# Patient Record
Sex: Male | Born: 1940 | Race: White | Hispanic: No | Smoking: Former smoker
Health system: Southern US, Community
[De-identification: ages and names within clinical notes are randomized; demographics above are authoritative.]

## PROBLEM LIST (undated history)

## (undated) DIAGNOSIS — I214 Non-ST elevation (NSTEMI) myocardial infarction: Secondary | ICD-10-CM

## (undated) DIAGNOSIS — I480 Paroxysmal atrial fibrillation: Secondary | ICD-10-CM

## (undated) DIAGNOSIS — E119 Type 2 diabetes mellitus without complications: Secondary | ICD-10-CM

## (undated) DIAGNOSIS — I2510 Atherosclerotic heart disease of native coronary artery without angina pectoris: Secondary | ICD-10-CM

## (undated) DIAGNOSIS — E785 Hyperlipidemia, unspecified: Secondary | ICD-10-CM

## (undated) DIAGNOSIS — I10 Essential (primary) hypertension: Secondary | ICD-10-CM

## (undated) DIAGNOSIS — Z9289 Personal history of other medical treatment: Secondary | ICD-10-CM

## (undated) DIAGNOSIS — R011 Cardiac murmur, unspecified: Secondary | ICD-10-CM

## (undated) DIAGNOSIS — M1990 Unspecified osteoarthritis, unspecified site: Secondary | ICD-10-CM

## (undated) DIAGNOSIS — I350 Nonrheumatic aortic (valve) stenosis: Secondary | ICD-10-CM

## (undated) DIAGNOSIS — K219 Gastro-esophageal reflux disease without esophagitis: Secondary | ICD-10-CM

## (undated) DIAGNOSIS — R319 Hematuria, unspecified: Secondary | ICD-10-CM

## (undated) DIAGNOSIS — C4491 Basal cell carcinoma of skin, unspecified: Secondary | ICD-10-CM

## (undated) DIAGNOSIS — Z87442 Personal history of urinary calculi: Secondary | ICD-10-CM

## (undated) HISTORY — PX: BASAL CELL CARCINOMA EXCISION: SHX1214

## (undated) HISTORY — DX: Hematuria, unspecified: R31.9

## (undated) HISTORY — PX: CATARACT EXTRACTION W/ INTRAOCULAR LENS  IMPLANT, BILATERAL: SHX1307

## (undated) HISTORY — DX: Hyperlipidemia, unspecified: E78.5

## (undated) HISTORY — PX: EYE SURGERY: SHX253

## (undated) HISTORY — DX: Essential (primary) hypertension: I10

## (undated) HISTORY — PX: CARDIAC CATHETERIZATION: SHX172

## (undated) HISTORY — PX: CYSTOSCOPY WITH STENT PLACEMENT: SHX5790

---
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
## 2011-03-16 DIAGNOSIS — R42 Dizziness and giddiness: Secondary | ICD-10-CM | POA: Diagnosis not present

## 2011-03-16 DIAGNOSIS — E7800 Pure hypercholesterolemia, unspecified: Secondary | ICD-10-CM | POA: Diagnosis not present

## 2011-03-16 DIAGNOSIS — G44209 Tension-type headache, unspecified, not intractable: Secondary | ICD-10-CM | POA: Diagnosis not present

## 2011-03-16 DIAGNOSIS — I10 Essential (primary) hypertension: Secondary | ICD-10-CM | POA: Diagnosis not present

## 2011-03-16 DIAGNOSIS — E119 Type 2 diabetes mellitus without complications: Secondary | ICD-10-CM | POA: Diagnosis not present

## 2011-03-17 DIAGNOSIS — L819 Disorder of pigmentation, unspecified: Secondary | ICD-10-CM | POA: Diagnosis not present

## 2011-03-17 DIAGNOSIS — L570 Actinic keratosis: Secondary | ICD-10-CM | POA: Diagnosis not present

## 2011-03-17 DIAGNOSIS — Z85828 Personal history of other malignant neoplasm of skin: Secondary | ICD-10-CM | POA: Diagnosis not present

## 2011-03-17 DIAGNOSIS — L219 Seborrheic dermatitis, unspecified: Secondary | ICD-10-CM | POA: Diagnosis not present

## 2011-03-22 DIAGNOSIS — I10 Essential (primary) hypertension: Secondary | ICD-10-CM | POA: Diagnosis not present

## 2011-03-22 DIAGNOSIS — R51 Headache: Secondary | ICD-10-CM | POA: Diagnosis not present

## 2011-03-22 DIAGNOSIS — R42 Dizziness and giddiness: Secondary | ICD-10-CM | POA: Diagnosis not present

## 2011-03-24 DIAGNOSIS — R51 Headache: Secondary | ICD-10-CM | POA: Diagnosis not present

## 2011-03-24 DIAGNOSIS — R42 Dizziness and giddiness: Secondary | ICD-10-CM | POA: Diagnosis not present

## 2011-03-29 DIAGNOSIS — I10 Essential (primary) hypertension: Secondary | ICD-10-CM | POA: Diagnosis not present

## 2011-03-29 DIAGNOSIS — R51 Headache: Secondary | ICD-10-CM | POA: Diagnosis not present

## 2011-04-13 DIAGNOSIS — IMO0002 Reserved for concepts with insufficient information to code with codable children: Secondary | ICD-10-CM | POA: Diagnosis not present

## 2011-04-13 DIAGNOSIS — R51 Headache: Secondary | ICD-10-CM | POA: Diagnosis not present

## 2011-04-13 DIAGNOSIS — I10 Essential (primary) hypertension: Secondary | ICD-10-CM | POA: Diagnosis not present

## 2011-08-10 DIAGNOSIS — Z125 Encounter for screening for malignant neoplasm of prostate: Secondary | ICD-10-CM | POA: Diagnosis not present

## 2011-08-10 DIAGNOSIS — Z79899 Other long term (current) drug therapy: Secondary | ICD-10-CM | POA: Diagnosis not present

## 2011-08-10 DIAGNOSIS — E7800 Pure hypercholesterolemia, unspecified: Secondary | ICD-10-CM | POA: Diagnosis not present

## 2011-08-13 DIAGNOSIS — Z79899 Other long term (current) drug therapy: Secondary | ICD-10-CM | POA: Diagnosis not present

## 2011-08-13 DIAGNOSIS — I10 Essential (primary) hypertension: Secondary | ICD-10-CM | POA: Diagnosis not present

## 2011-08-13 DIAGNOSIS — E7800 Pure hypercholesterolemia, unspecified: Secondary | ICD-10-CM | POA: Diagnosis not present

## 2011-08-23 DIAGNOSIS — H00029 Hordeolum internum unspecified eye, unspecified eyelid: Secondary | ICD-10-CM | POA: Diagnosis not present

## 2011-08-23 DIAGNOSIS — E1139 Type 2 diabetes mellitus with other diabetic ophthalmic complication: Secondary | ICD-10-CM | POA: Diagnosis not present

## 2011-09-06 DIAGNOSIS — Z86010 Personal history of colonic polyps: Secondary | ICD-10-CM | POA: Diagnosis not present

## 2011-09-10 DIAGNOSIS — I10 Essential (primary) hypertension: Secondary | ICD-10-CM | POA: Diagnosis not present

## 2011-09-10 DIAGNOSIS — Z880 Allergy status to penicillin: Secondary | ICD-10-CM | POA: Diagnosis not present

## 2011-09-10 DIAGNOSIS — Z86010 Personal history of colonic polyps: Secondary | ICD-10-CM | POA: Diagnosis not present

## 2011-09-10 DIAGNOSIS — E119 Type 2 diabetes mellitus without complications: Secondary | ICD-10-CM | POA: Diagnosis not present

## 2011-09-20 DIAGNOSIS — H00029 Hordeolum internum unspecified eye, unspecified eyelid: Secondary | ICD-10-CM | POA: Diagnosis not present

## 2011-12-14 DIAGNOSIS — Z79899 Other long term (current) drug therapy: Secondary | ICD-10-CM | POA: Diagnosis not present

## 2011-12-14 DIAGNOSIS — E7800 Pure hypercholesterolemia, unspecified: Secondary | ICD-10-CM | POA: Diagnosis not present

## 2011-12-20 DIAGNOSIS — Z85828 Personal history of other malignant neoplasm of skin: Secondary | ICD-10-CM | POA: Diagnosis not present

## 2011-12-20 DIAGNOSIS — L819 Disorder of pigmentation, unspecified: Secondary | ICD-10-CM | POA: Diagnosis not present

## 2011-12-20 DIAGNOSIS — L570 Actinic keratosis: Secondary | ICD-10-CM | POA: Diagnosis not present

## 2011-12-20 DIAGNOSIS — L821 Other seborrheic keratosis: Secondary | ICD-10-CM | POA: Diagnosis not present

## 2011-12-21 DIAGNOSIS — Z23 Encounter for immunization: Secondary | ICD-10-CM | POA: Diagnosis not present

## 2011-12-21 DIAGNOSIS — Z79899 Other long term (current) drug therapy: Secondary | ICD-10-CM | POA: Diagnosis not present

## 2011-12-21 DIAGNOSIS — E7800 Pure hypercholesterolemia, unspecified: Secondary | ICD-10-CM | POA: Diagnosis not present

## 2011-12-21 DIAGNOSIS — I10 Essential (primary) hypertension: Secondary | ICD-10-CM | POA: Diagnosis not present

## 2012-05-05 ENCOUNTER — Encounter (HOSPITAL_COMMUNITY)
Admission: AD | Disposition: A | Discharge status: Home / Self Care | Payer: Self-pay | Source: Other Acute Inpatient Hospital | Attending: Cardiology

## 2012-05-05 ENCOUNTER — Inpatient Hospital Stay (HOSPITAL_COMMUNITY)
Admission: AD | Admit: 2012-05-05 | Discharge: 2012-05-06 | DRG: 247 | Disposition: A | Discharge status: Home / Self Care | Payer: Medicare Other | Source: Other Acute Inpatient Hospital | Attending: Cardiology | Admitting: Cardiology

## 2012-05-05 ENCOUNTER — Other Ambulatory Visit: Payer: Self-pay | Admitting: Physician Assistant

## 2012-05-05 DIAGNOSIS — Z0000 Encounter for general adult medical examination without abnormal findings: Secondary | ICD-10-CM | POA: Diagnosis not present

## 2012-05-05 DIAGNOSIS — I214 Non-ST elevation (NSTEMI) myocardial infarction: Secondary | ICD-10-CM | POA: Diagnosis not present

## 2012-05-05 DIAGNOSIS — I10 Essential (primary) hypertension: Secondary | ICD-10-CM | POA: Diagnosis not present

## 2012-05-05 DIAGNOSIS — Z8249 Family history of ischemic heart disease and other diseases of the circulatory system: Secondary | ICD-10-CM | POA: Diagnosis not present

## 2012-05-05 DIAGNOSIS — Z23 Encounter for immunization: Secondary | ICD-10-CM | POA: Diagnosis not present

## 2012-05-05 DIAGNOSIS — Z91041 Radiographic dye allergy status: Secondary | ICD-10-CM

## 2012-05-05 DIAGNOSIS — Z880 Allergy status to penicillin: Secondary | ICD-10-CM | POA: Diagnosis not present

## 2012-05-05 DIAGNOSIS — E7800 Pure hypercholesterolemia, unspecified: Secondary | ICD-10-CM | POA: Diagnosis not present

## 2012-05-05 DIAGNOSIS — Z87891 Personal history of nicotine dependence: Secondary | ICD-10-CM | POA: Diagnosis not present

## 2012-05-05 DIAGNOSIS — Z7902 Long term (current) use of antithrombotics/antiplatelets: Secondary | ICD-10-CM | POA: Diagnosis not present

## 2012-05-05 DIAGNOSIS — R0609 Other forms of dyspnea: Secondary | ICD-10-CM | POA: Diagnosis not present

## 2012-05-05 DIAGNOSIS — I200 Unstable angina: Secondary | ICD-10-CM

## 2012-05-05 DIAGNOSIS — E119 Type 2 diabetes mellitus without complications: Secondary | ICD-10-CM | POA: Diagnosis present

## 2012-05-05 DIAGNOSIS — E785 Hyperlipidemia, unspecified: Secondary | ICD-10-CM | POA: Diagnosis present

## 2012-05-05 DIAGNOSIS — R7989 Other specified abnormal findings of blood chemistry: Secondary | ICD-10-CM | POA: Diagnosis not present

## 2012-05-05 DIAGNOSIS — Z79899 Other long term (current) drug therapy: Secondary | ICD-10-CM

## 2012-05-05 DIAGNOSIS — I2510 Atherosclerotic heart disease of native coronary artery without angina pectoris: Secondary | ICD-10-CM | POA: Diagnosis present

## 2012-05-05 DIAGNOSIS — Z7982 Long term (current) use of aspirin: Secondary | ICD-10-CM | POA: Diagnosis not present

## 2012-05-05 DIAGNOSIS — R799 Abnormal finding of blood chemistry, unspecified: Secondary | ICD-10-CM | POA: Diagnosis not present

## 2012-05-05 DIAGNOSIS — R0602 Shortness of breath: Secondary | ICD-10-CM | POA: Diagnosis not present

## 2012-05-05 DIAGNOSIS — R0789 Other chest pain: Secondary | ICD-10-CM | POA: Diagnosis not present

## 2012-05-05 DIAGNOSIS — R079 Chest pain, unspecified: Secondary | ICD-10-CM | POA: Diagnosis not present

## 2012-05-05 DIAGNOSIS — Z955 Presence of coronary angioplasty implant and graft: Secondary | ICD-10-CM

## 2012-05-05 HISTORY — DX: Atherosclerotic heart disease of native coronary artery without angina pectoris: I25.10

## 2012-05-05 HISTORY — PX: LEFT HEART CATHETERIZATION WITH CORONARY ANGIOGRAM: SHX5451

## 2012-05-05 LAB — HEMOGLOBIN A1C: Hgb A1c MFr Bld: 7.9 % — ABNORMAL HIGH (ref <5.7)

## 2012-05-05 LAB — POCT ACTIVATED CLOTTING TIME
Activated Clotting Time: 192 seconds
Activated Clotting Time: >1000 seconds

## 2012-05-05 LAB — GLUCOSE, CAPILLARY: Glucose-Capillary: 281 mg/dL — ABNORMAL HIGH (ref 70–99)

## 2012-05-05 SURGERY — LEFT HEART CATHETERIZATION WITH CORONARY ANGIOGRAM
Anesthesia: LOCAL

## 2012-05-05 MED ORDER — SODIUM CHLORIDE 0.9 % IJ SOLN: 3.0000 mL | INTRAMUSCULAR | Status: DC | PRN

## 2012-05-05 MED ORDER — ACETAMINOPHEN 325 MG PO TABS: 650.0000 mg | ORAL_TABLET | ORAL | Status: DC | PRN

## 2012-05-05 MED ORDER — ATORVASTATIN CALCIUM 80 MG PO TABS
80.0000 mg | ORAL_TABLET | Freq: Every day | ORAL | Status: DC
  Administered 2012-05-05: 80 mg via ORAL
  Filled 2012-05-05 (×2): qty 1

## 2012-05-05 MED ORDER — MORPHINE SULFATE 2 MG/ML IJ SOLN: 2.0000 mg | INTRAMUSCULAR | Status: DC | PRN

## 2012-05-05 MED ORDER — METOPROLOL TARTRATE 1 MG/ML IV SOLN
INTRAVENOUS | Status: AC
  Filled 2012-05-05: qty 5

## 2012-05-05 MED ORDER — ASPIRIN EC 81 MG PO TBEC
81.0000 mg | DELAYED_RELEASE_TABLET | Freq: Every day | ORAL | Status: DC
  Administered 2012-05-05 – 2012-05-06 (×2): 81 mg via ORAL
  Filled 2012-05-05 (×2): qty 1

## 2012-05-05 MED ORDER — HEPARIN SODIUM (PORCINE) 1000 UNIT/ML IJ SOLN
INTRAMUSCULAR | Status: AC
  Filled 2012-05-05: qty 1

## 2012-05-05 MED ORDER — NITROGLYCERIN 1 MG/10 ML FOR IR/CATH LAB
INTRA_ARTERIAL | Status: AC
  Filled 2012-05-05: qty 10

## 2012-05-05 MED ORDER — ZOLPIDEM TARTRATE 5 MG PO TABS: 5.0000 mg | ORAL_TABLET | Freq: Every evening | ORAL | Status: DC | PRN

## 2012-05-05 MED ORDER — MIDAZOLAM HCL 2 MG/2ML IJ SOLN
INTRAMUSCULAR | Status: AC
  Filled 2012-05-05: qty 2

## 2012-05-05 MED ORDER — BIVALIRUDIN 250 MG IV SOLR
INTRAVENOUS | Status: AC
  Filled 2012-05-05: qty 250

## 2012-05-05 MED ORDER — NITROGLYCERIN 0.4 MG SL SUBL: 0.4000 mg | SUBLINGUAL_TABLET | SUBLINGUAL | Status: DC | PRN

## 2012-05-05 MED ORDER — INSULIN ASPART 100 UNIT/ML ~~LOC~~ SOLN
0.0000 [IU] | Freq: Every day | SUBCUTANEOUS | Status: DC
  Administered 2012-05-05: 2 [IU] via SUBCUTANEOUS

## 2012-05-05 MED ORDER — METOPROLOL SUCCINATE ER 100 MG PO TB24
100.0000 mg | ORAL_TABLET | Freq: Two times a day (BID) | ORAL | Status: DC
  Administered 2012-05-05 – 2012-05-06 (×3): 100 mg via ORAL
  Filled 2012-05-05 (×4): qty 1

## 2012-05-05 MED ORDER — LISINOPRIL 20 MG PO TABS
20.0000 mg | ORAL_TABLET | Freq: Every day | ORAL | Status: DC
  Administered 2012-05-05 – 2012-05-06 (×2): 20 mg via ORAL
  Filled 2012-05-05 (×2): qty 1

## 2012-05-05 MED ORDER — INSULIN ASPART 100 UNIT/ML ~~LOC~~ SOLN
0.0000 [IU] | Freq: Three times a day (TID) | SUBCUTANEOUS | Status: DC
  Administered 2012-05-05 – 2012-05-06 (×2): 8 [IU] via SUBCUTANEOUS
  Administered 2012-05-06: 2 [IU] via SUBCUTANEOUS

## 2012-05-05 MED ORDER — PRASUGREL HCL 10 MG PO TABS
10.0000 mg | ORAL_TABLET | Freq: Every day | ORAL | Status: DC
  Administered 2012-05-06: 10 mg via ORAL
  Filled 2012-05-05: qty 1

## 2012-05-05 MED ORDER — NITROGLYCERIN IN D5W 200-5 MCG/ML-% IV SOLN
3.0000 ug/min | INTRAVENOUS | Status: DC
  Administered 2012-05-05: 5 ug/min via INTRAVENOUS
  Filled 2012-05-05: qty 250

## 2012-05-05 MED ORDER — SODIUM CHLORIDE 0.9 % IV SOLN: 250.0000 mL | INTRAVENOUS | Status: DC | PRN

## 2012-05-05 MED ORDER — OXYCODONE-ACETAMINOPHEN 5-325 MG PO TABS: 1.0000 | ORAL_TABLET | ORAL | Status: DC | PRN

## 2012-05-05 MED ORDER — LIDOCAINE HCL (PF) 1 % IJ SOLN
INTRAMUSCULAR | Status: AC
  Filled 2012-05-05: qty 30

## 2012-05-05 MED ORDER — ONDANSETRON HCL 4 MG/2ML IJ SOLN
4.0000 mg | Freq: Four times a day (QID) | INTRAMUSCULAR | Status: DC | PRN
  Administered 2012-05-05: 4 mg via INTRAVENOUS
  Filled 2012-05-05: qty 2

## 2012-05-05 MED ORDER — SODIUM CHLORIDE 0.9 % IV SOLN: INTRAVENOUS | Status: DC

## 2012-05-05 MED ORDER — DIAZEPAM 5 MG PO TABS: 5.0000 mg | ORAL_TABLET | ORAL | Status: DC | PRN

## 2012-05-05 MED ORDER — FENTANYL CITRATE 0.05 MG/ML IJ SOLN
INTRAMUSCULAR | Status: AC
  Filled 2012-05-05: qty 2

## 2012-05-05 MED ORDER — PRASUGREL HCL 10 MG PO TABS
ORAL_TABLET | ORAL | Status: AC
  Filled 2012-05-05: qty 6

## 2012-05-05 MED ORDER — VERAPAMIL HCL 2.5 MG/ML IV SOLN
INTRAVENOUS | Status: AC
  Filled 2012-05-05: qty 2

## 2012-05-05 MED ORDER — HEPARIN (PORCINE) IN NACL 2-0.9 UNIT/ML-% IJ SOLN
INTRAMUSCULAR | Status: AC
  Filled 2012-05-05: qty 1000

## 2012-05-05 MED ORDER — SODIUM CHLORIDE 0.9 % IJ SOLN
3.0000 mL | Freq: Two times a day (BID) | INTRAMUSCULAR | Status: DC
  Administered 2012-05-05: 3 mL via INTRAVENOUS

## 2012-05-05 MED ORDER — SODIUM CHLORIDE 0.9 % IV SOLN: INTRAVENOUS | Status: AC

## 2012-05-05 NOTE — Interval H&P Note (Signed)
History and Physical Interval Note:  05/05/2012 3:10 PM  Sean Villarreal  has presented today for cardiac cath with the diagnosis of Chest pain  The various methods of treatment have been discussed with the patient and family. After consideration of risks, benefits and other options for treatment, the patient has consented to  Procedure(s): LEFT HEART CATHETERIZATION WITH CORONARY ANGIOGRAM (N/A) as a surgical intervention .  The patient's history has been reviewed, patient examined, no change in status, stable for surgery.  I have reviewed the patient's chart and labs.  Questions were answered to the patient's satisfaction.     [name]

## 2012-05-05 NOTE — Progress Notes (Signed)
ANTICOAGULATION CONSULT NOTE - Initial Consult  Pharmacy Consult for Lovenox Indication: chest pain/ACS  Allergies  Allergen Reactions  . Penicillins     Passed out as a child  . Contrast Media (Iodinated Diagnostic Agents)     Had "welts" on arm, and kidney issues after given dye in the past    Patient Measurements: Height: 5\' 9"  (175.3 cm) Weight: 230 lb 6.1 oz (104.5 kg) IBW/kg (Calculated) : 70.7 Heparin Dosing Weight: 104.5 kg  Vital Signs: Temp: 98.1 F (36.7 C) (04/11 1300) Temp src: Oral (04/11 1300)  Labs: No results found for this basename: HGB, HCT, PLT, APTT, LABPROT, INR, HEPARINUNFRC, CREATININE, CKTOTAL, CKMB, TROPONINI,  in the last 72 hours  CrCl is unknown because no creatinine reading has been taken.   Medical History: No past medical history on file.  Medications:  Prescriptions prior to admission  Medication Sig Dispense Refill  . aspirin 81 MG tablet Take 81 mg by mouth daily.      [name] atorvastatin (LIPITOR) 10 MG tablet Take 10 mg by mouth daily.      [name] glimepiride (AMARYL) 1 MG tablet Take 1 mg by mouth daily before breakfast.      . hydrALAZINE (APRESOLINE) 50 MG tablet Take 50 mg by mouth 2 (two) times daily.      . hydrochlorothiazide (HYDRODIURIL) 50 MG tablet Take 50 mg by mouth daily.      [name] ketoconazole (NIZORAL) 2 % cream Apply 1 application topically 2 (two) times daily as needed (apply to face as needed (uses during the winter)).      [name] lisinopril (PRINIVIL,ZESTRIL) 20 MG tablet Take 20 mg by mouth 2 (two) times daily.      . metFORMIN (GLUCOPHAGE) 500 MG tablet Take 500 mg by mouth daily with breakfast.      . metoprolol (LOPRESSOR) 100 MG tablet Take 100 mg by mouth 2 (two) times daily.      . nisoldipine (SULAR) 34 MG 24 hr tablet Take 34 mg by mouth daily.      . potassium chloride SA (K-DUR,KLOR-CON) 20 MEQ tablet Take 20 mEq by mouth 2 (two) times daily.        Assessment: 72 yo male presented to [facility] ED with chest pain,  transferred to [facility] for urgent cath.  Lovenox 100 mg given x 1 dose in ED at [facility] ~ 430 AM.  Goal of Therapy:  Anti-Xa level 0.6-1.2 units/ml 4hrs after LMWH dose given Monitor platelets by anticoagulation protocol: Yes   Plan:  1. Will f/u after cath for plans for further anticoagulation.  Tad Moore, BCPS  Clinical Pharmacist Pager [phone]  05/05/2012 1:53 PM

## 2012-05-05 NOTE — Progress Notes (Signed)
Patient arrived from [facility].  Full consult note by Dr. Katz is found in shadow chart. No chest pain at present. Awaiting cath/PCI later this afternoon. 

## 2012-05-05 NOTE — CV Procedure (Signed)
Cardiac Catheterization Operative Report  Sean Villarreal [id number] 4/11/20145:03 PM No PCP Per Patient  Procedure Performed:  1. Left Heart Catheterization 2. Selective Coronary Angiography 3. Left ventricular angiogram 4. PTCA/DES x 1 mid LAD 5. PTCA/DES x 1 ostial Circumflex 6. Angioseal femoral artery closure device  Operator: Verne Carrow, MD  Arterial access site:  Right radial artery and right femoral artery.   Indication: 72 yo male with history of DM, HTN, HLD, former tobacco abuse admitted to [facility] today after several episodes of severe chest pain. Troponin elevated at [facility]. EKG with non-specific ST changes. Cardiac cath for NSTEMI.                                      Procedure Details: The risks, benefits, complications, treatment options, and expected outcomes were discussed with the patient. The patient and/or family concurred with the proposed plan, giving informed consent. The patient was brought to the cath lab after IV hydration was begun and oral premedication was given. The patient was further sedated with Versed. The right wrist was assessed with an Allens test which was positive. The right wrist was prepped and draped in a sterile fashion. 1% lidocaine was used for local anesthesia. Using the modified Seldinger access technique, a 5 French sheath was placed in the right radial artery. 3 mg Verapamil was given through the sheath. The wire easily passed to the right subclavian artery but we could not advance the wire into the aortic arch. I then elected to proceed with access in the right femoral artery. The right groin was prepped and draped in a sterile fashion. 1% lidocaine was used for local anesthesia. Using the modified Seldinger access technique, a 5 French sheath was placed in the right femoral artery. Standard diagnostic catheters were used to perform selective coronary angiography. A pigtail catheter was used to perform a left  ventricular angiogram. He was found to have severe disease in the mid LAD and ostium of the Circumflex artery. The lesion in the mid LAD was in a calcified segment of the vessel, appeared hazy with severe stenosis. The lesion in the ostium of the Circumflex was severe. His presentation was with a NSTEMI (acute coronary syndrome). It was difficult to say which lesion was the culprit. I weighed the options for treatment including CABG vs PCI. Both lesions were focal and I felt that they could be treated with short stents. My plan was to perform IVUS of the mid LAD to better define the degree of calcification in the vessel.   I engaged the left main with a XB LAD 3.5 guiding catheter. He was given a 5000 Unit IV bolus of heparin. When the ACT was over 200, I passed a BMW wire down the LAD. I then attempted to pass an IVUS catheter down beyond the mid LAD, however, I could not get the IVUS catheter to pass beyond the severe stenosis. The IVUS catheter was removed from the vessel. At this point, we committed to double vessel PCI as this was felt to be the best method for revascularization. He was given a bolus of Angiomax and a drip was started. He was given Effient 60 mg po x 1. I then positioned and inflated a 2.5 x 12 mm balloon into the mid LAD. A 3.0 x 18 mm Xience Xpedition DES was deployed in the mid LAD. The stent was post-dilated with  a 3.25 x 12 mm [place] balloon x 1. The stenosis was taken from 99% down to 0%. There was excellent flow into the distal LAD. I then turned my attention to the lesion in the ostium of the Circumflex. A Whisper wire was advanced down into the Circumflex artery. Flow was lost into the vessel with the wire across the lesion. A 2.0 x 12 mm balloon was used to pre-dilate the stenosis in the ostium of the Circumflex. I then carefully positioned and placed a 2.5 x 12 mm Xience Xpedition DES in the ostium of the Circumflex. This was post-dilated with a 2.75 x 8 mm [place] balloon x 1. The stenosis  was taken from 99% down to 0%. There was excellent flow into the distal vessel. Final angiography demonstrated patency of both stents with good flow into the distal Circumflex and distal LAD. An Angioseal femoral artery closure device was placed in the right femoral artery.   There were no immediate complications. The patient was taken to the recovery area in stable condition.   Hemodynamic Findings: Central aortic pressure: 120/65 Left ventricular pressure: 130/5/13  Angiographic Findings:  Left main: Distal 30% stenosis.   Left Anterior Descending Artery: Large caliber vessel that courses to the apex. The proximal and mid vessel has severe calcification. The proximal vessel has diffuse 30% stenosis. The mid vessel has a hazy, focal 99% stenosis. The mid vessel has serial 40% stenoses with diffuse calcification. The distal vessel has a 70% stenosis. The first diagonal branch is very small in caliber (1.5 mm) and has an ostial 80% stenosis. The second diagonal branch is small in caliber and has a 50% ostial stenosis.   Circumflex Artery: Moderate caliber vessel with 99% ostial stenosis. There is calcification of the proximal and mid vessel. The first obtuse marginal branch is moderate in caliber and has 40% ostial stenosis.   Right Coronary Artery:  Large dominant vessel with mild luminal irregularities.   Left Ventricular Angiogram: LVEF 50%  Impression:  1. Severe double vessel CAD 2. NSTEMI/Acute coronary syndrome 3. Preserved LV systolic function 4. Successful PTCA/DES x 1 mid LAD 5. Successful PTCA/DES x 1 ostial Circumflex  Recommendations: He will need dual antiplatelet therapy with ASA and Effient for at least one year. He will need aggressive risk factor reduction. Continue statin, beta blocker, Ace-inh.        Complications:  None. The patient tolerated the procedure well.

## 2012-05-05 NOTE — Progress Notes (Signed)
Utilization Review Completed.Erabella Kuipers T4/11/2012  

## 2012-05-05 NOTE — H&P (View-Only) (Signed)
Patient arrived from [facility].  Full consult note by Dr. Myrtis Ser is found in shadow chart. No chest pain at present. Awaiting cath/PCI later this afternoon.

## 2012-05-06 ENCOUNTER — Encounter (HOSPITAL_COMMUNITY): Payer: Self-pay | Admitting: *Deleted

## 2012-05-06 DIAGNOSIS — I2510 Atherosclerotic heart disease of native coronary artery without angina pectoris: Secondary | ICD-10-CM | POA: Diagnosis not present

## 2012-05-06 DIAGNOSIS — I10 Essential (primary) hypertension: Secondary | ICD-10-CM | POA: Diagnosis not present

## 2012-05-06 DIAGNOSIS — I214 Non-ST elevation (NSTEMI) myocardial infarction: Secondary | ICD-10-CM | POA: Diagnosis not present

## 2012-05-06 DIAGNOSIS — E119 Type 2 diabetes mellitus without complications: Secondary | ICD-10-CM | POA: Diagnosis not present

## 2012-05-06 DIAGNOSIS — I200 Unstable angina: Secondary | ICD-10-CM

## 2012-05-06 LAB — BASIC METABOLIC PANEL
BUN: 16 mg/dL (ref 6–23)
Creatinine, Ser: 0.94 mg/dL (ref 0.50–1.35)
GFR calc Af Amer: >90 mL/min (ref >90)
GFR calc non Af Amer: 83 mL/min — ABNORMAL LOW (ref >90)
Potassium: 3.8 mEq/L (ref 3.5–5.1)

## 2012-05-06 LAB — CBC
MCH: 30.8 pg (ref 26.0–34.0)
MCHC: 34.9 g/dL (ref 30.0–36.0)
Platelets: 199 10*3/uL (ref 150–400)
RDW: 12.6 % (ref 11.5–15.5)

## 2012-05-06 MED ORDER — NITROGLYCERIN 0.4 MG SL SUBL: 0.4000 mg | SUBLINGUAL_TABLET | SUBLINGUAL | Status: DC | PRN

## 2012-05-06 MED ORDER — METOPROLOL SUCCINATE ER 100 MG PO TB24: 100.0000 mg | ORAL_TABLET | Freq: Two times a day (BID) | ORAL | Status: DC

## 2012-05-06 MED ORDER — PRASUGREL HCL 10 MG PO TABS: 10.0000 mg | ORAL_TABLET | Freq: Every day | ORAL | Status: DC

## 2012-05-06 MED ORDER — ATORVASTATIN CALCIUM 80 MG PO TABS: 80.0000 mg | ORAL_TABLET | Freq: Every day | ORAL | Status: DC

## 2012-05-06 MED ORDER — METFORMIN HCL 500 MG PO TABS: 500.0000 mg | ORAL_TABLET | Freq: Every day | ORAL | Status: DC

## 2012-05-06 MED ORDER — LISINOPRIL 20 MG PO TABS: 20.0000 mg | ORAL_TABLET | Freq: Every day | ORAL | Status: DC

## 2012-05-06 NOTE — Plan of Care (Signed)
Problem: Phase II Progression Outcomes Goal: Pain controlled with appropriate interventions Outcome: Completed/Met Date Met:  05/06/12 Denied pain,pressure,tightness,SOB,N ,numbness and tingling this shift . Goal: Ambulates up to 600 ft. in hall x 1 SEE cardiac rehab nurse notes

## 2012-05-06 NOTE — Progress Notes (Signed)
CARDIAC REHAB PHASE I   PRE:  Rate/Rhythm:Sr 74  BP:  Supine:   Sitting: 127/61  Standing:    SaO2: 95 RA  MODE:  Ambulation: 350 ft   POST:  Rate/Rhythm: Sr 95  BP:  Supine:  Sitting: 137/79  Standing:    SaO2: 96 RA  Ambulated with minimal assist in hallway.  Pt with no complaint of CP or SOB.  Pt anticipates going home today.  Education completed with emphasis on diabetes management.  Reviewed heart healthy diet, activity guidelines/exercise, stent card, NTG protocol alert 911 for any unresolved CP.  Pt given Heart Attack booklet.  Pt agreeable for name and number to be sent to the Outpatient Cardiac Rehab program at [facility].  Wife at bedside.  Pt to side of bed. [id number] Arna Medici

## 2012-05-06 NOTE — Progress Notes (Signed)
Watching video w/wife .routine post d/c home activity ,rt goin site and RUE  care ,etc.... reviewed w/Pt and spouse .

## 2012-05-06 NOTE — Discharge Summary (Signed)
ELECTROPHYSIOLOGY DISCHARGE SUMMARY    Patient ID: Sean Villarreal,  MRN: [id number], DOB/AGE: 1940-08-31 72 y.o.  Admit date: 05/05/2012 Discharge date: 05/06/2012  Primary Care Physician: None Primary Cardiologist: New to [facility], MD  Primary Discharge Diagnosis:  1. CAD s/p NSTEMI, PTCA/DES x 1 to mid LAD, PTCA/DES x 1 to ostial LCx 2. Preserved LV function  Secondary Discharge Diagnoses:  1. HTN 2. Dyslipidemia 3. DM 4. Former tobacco abuse  Procedures This Admission: 1. Cardiac catheterization  Hemodynamic Findings:  Central aortic pressure: 120/65  Left ventricular pressure: 130/5/13  Angiographic Findings:  Left main: Distal 30% stenosis.  Left Anterior Descending Artery: Large caliber vessel that courses to the apex. The proximal and mid vessel has severe calcification. The proximal vessel has diffuse 30% stenosis. The mid vessel has a hazy, focal 99% stenosis. The mid vessel has serial 40% stenoses with diffuse calcification. The distal vessel has a 70% stenosis. The first diagonal branch is very small in caliber (1.5 mm) and has an ostial 80% stenosis. The second diagonal branch is small in caliber and has a 50% ostial stenosis.  Circumflex Artery: Moderate caliber vessel with 99% ostial stenosis. There is calcification of the proximal and mid vessel. The first obtuse marginal branch is moderate in caliber and has 40% ostial stenosis.  Right Coronary Artery: Large dominant vessel with mild luminal irregularities.  Left Ventricular Angiogram: LVEF 50%  Impression:  1. Severe double vessel CAD  2. NSTEMI/Acute coronary syndrome  3. Preserved LV systolic function  4. Successful PTCA/DES x 1 mid LAD  5. Successful PTCA/DES x 1 ostial Circumflex  Recommendations: He will need dual antiplatelet therapy with ASA and Effient for at least one year. He will need aggressive risk factor reduction. Continue statin, beta blocker, Ace-inh.   History and Hospital Course:    Mr. Wanat is a 72 yo man with DM, HTN, HLD and former tobacco abuse who was admitted to [facility] after several episodes of severe chest pain. There his 12-lead ECG showed nonspecific ST changes but he ruled in for NSTEMI with an elevated troponin. He then was transferred to [facility] for further evaluation and treatment. He underwent cardiac catheterization yesterday 05/05/2012 with details as outlined above. He was found to have severe 2-vessel CAD which was successfully treated using DES x 1 to mid LAD and DES x 1 to ostial LCx. He has preserved LV function. He remained overnight for observation on telemetry. Medical therapy with ASA, Effient, BB, ACEI and statin. He remains hemodynamically stable. He has been ambulating without difficulty. He has been seen, examined and deemed stable for DC today by Dr. Arvilla Meres. He was referred for cardiac rehab. He was counseled regarding the importance of medication compliance and follow-up. He will follow-up with Dr. Myrtis Ser in our [place] office in 2 weeks.    Discharge Vitals: Blood pressure 119/56, pulse 80, temperature 98.6 F (37 C), temperature source Oral, resp. rate 17, height 5\' 9"  (1.753 m), weight 230 lb 6.1 oz (104.5 kg), SpO2 95.00%.   Labs: Lab Results  Component Value Date   WBC 15.2* 05/06/2012   HGB 14.2 05/06/2012   HCT 40.7 05/06/2012   MCV 88.3 05/06/2012   PLT 199 05/06/2012    Recent Labs Lab 05/06/12 0500  NA 138  K 3.8  CL 100  CO2 28  BUN 16  CREATININE 0.94  CALCIUM 8.9  GLUCOSE 189*    Lab Results  Component Value Date   CHOL 126  05/06/2012   Lab Results  Component Value Date   HDL 45 05/06/2012   Lab Results  Component Value Date   LDLCALC 60 05/06/2012   Lab Results  Component Value Date   TRIG 104 05/06/2012   Lab Results  Component Value Date   CHOLHDL 2.8 05/06/2012    Disposition:  The patient is being discharged in stable condition.  Follow-up: Follow-up Information   Follow up with  SERPE, EUGENE, PA-C On 05/17/2012. (At 1:00 PM for hospital follow-up)    Contact information:   [facility] office [address] [place]  [address] [place] [address] [phone]     Discharge Medications:    Medication List    STOP taking these medications       hydrALAZINE 50 MG tablet  Commonly known as:  APRESOLINE     hydrochlorothiazide 50 MG tablet  Commonly known as:  HYDRODIURIL     metoprolol 100 MG tablet  Commonly known as:  LOPRESSOR     nisoldipine 34 MG 24 hr tablet  Commonly known as:  SULAR     potassium chloride SA 20 MEQ tablet  Commonly known as:  K-DUR,KLOR-CON      TAKE these medications       aspirin 81 MG tablet  Take 81 mg by mouth daily.     atorvastatin 80 MG tablet  Commonly known as:  LIPITOR  Take 1 tablet (80 mg total) by mouth daily.     glimepiride 1 MG tablet  Commonly known as:  AMARYL  Take 1 mg by mouth daily before breakfast.     ketoconazole 2 % cream  Commonly known as:  NIZORAL  Apply 1 application topically 2 (two) times daily as needed (apply to face as needed (uses during the winter)).     lisinopril 20 MG tablet  Commonly known as:  PRINIVIL,ZESTRIL  Take 1 tablet (20 mg total) by mouth daily.     metFORMIN 500 MG tablet  Commonly known as:  GLUCOPHAGE  Take 1 tablet (500 mg total) by mouth daily with breakfast. HOLD for 3 days. RESTART on 05/09/2012.     metoprolol succinate 100 MG 24 hr tablet  Commonly known as:  TOPROL-XL  Take 1 tablet (100 mg total) by mouth 2 (two) times daily. Take with or immediately following a meal.     nitroGLYCERIN 0.4 MG SL tablet  Commonly known as:  NITROSTAT  Place 1 tablet (0.4 mg total) under the tongue every 5 (five) minutes x 3 doses as needed for chest pain.     prasugrel 10 MG Tabs  Commonly known as:  EFFIENT  Take 1 tablet (10 mg total) by mouth daily.       Duration of Discharge Encounter: Greater than 30 minutes including physician time.  Signed, Rick Duff, PA-C 05/06/2012, 2:50 PM

## 2012-05-06 NOTE — Progress Notes (Signed)
   Subjective:   72 yo male with history of DM, HTN, HLD, former tobacco abuse admitted from [facility] for acute coronary syndrome (trop < 0.05).  Cath yesterday with 2v CAD and 2 stents placed. Feels good. No further CP. No groin problems      Intake/Output Summary (Last 24 hours) at 05/06/12 1100 Last data filed at 05/06/12 0600  Gross per 24 hour  Intake   1071 ml  Output    975 ml  Net     96 ml    Current meds: . aspirin EC  81 mg Oral Daily  . atorvastatin  80 mg Oral q1800  . insulin aspart  0-15 Units Subcutaneous TID WC  . insulin aspart  0-5 Units Subcutaneous QHS  . lisinopril  20 mg Oral Daily  . metoprolol succinate  100 mg Oral BID  . prasugrel  10 mg Oral Daily  . sodium chloride  3 mL Intravenous Q12H   Infusions:     Objective:  Blood pressure 120/60, pulse 64, temperature 97.9 F (36.6 C), temperature source Oral, resp. rate 14, height 5\' 9"  (1.753 m), weight 104.5 kg (230 lb 6.1 oz), SpO2 93.00%. Weight change:   Physical Exam: General:  Well appearing. No resp difficulty HEENT: normal Neck: supple. JVP flat . Carotids 2+ bilat; no bruits. No lymphadenopathy or thryomegaly appreciated. Cor: PMI nondisplaced. Regular rate & rhythm. No rubs, gallops or murmurs. Lungs: clear Abdomen: soft, nontender, nondistended. No hepatosplenomegaly. No bruits or masses. Good bowel sounds. Extremities: no cyanosis, clubbing, rash, edema  Groin site no hematoma or bruit Neuro: alert & orientedx3, cranial nerves grossly intact. moves all 4 extremities w/o difficulty. Affect pleasant  Telemetry: SR  Lab Results: Basic Metabolic Panel:  Recent Labs Lab 05/06/12 0500  NA 138  K 3.8  CL 100  CO2 28  GLUCOSE 189*  BUN 16  CREATININE 0.94  CALCIUM 8.9   Liver Function Tests: No results found for this basename: AST, ALT, ALKPHOS, BILITOT, PROT, ALBUMIN,  in the last 168 hours No results found for this basename: LIPASE, AMYLASE,  in the last 168  hours No results found for this basename: AMMONIA,  in the last 168 hours CBC:  Recent Labs Lab 05/06/12 0500  WBC 15.2*  HGB 14.2  HCT 40.7  MCV 88.3  PLT 199   Cardiac Enzymes: No results found for this basename: CKTOTAL, CKMB, CKMBINDEX, TROPONINI,  in the last 168 hours BNP: No components found with this basename: POCBNP,  CBG:  Recent Labs Lab 05/05/12 1828 05/05/12 2209 05/06/12 0739  GLUCAP 281* 241* 174*   Microbiology: No results found for this basename: cult   No results found for this basename: CULT, SDES,  in the last 168 hours  Imaging: No results found.   ASSESSMENT:  1. USA/ACS 2. CAD s/p pci x2 3. DM2   PLAN/DISCUSSION:  Doing great. Home today. Continue Efient x 1 year. Conatinue asa 81, lipitor 80, ace, and b-blocker.  Arrange f/u [facility] 2-3 week. Referral to cardiac rehab in [place] made.  We talked extensively about risk factor modification and warning signs for recurrent angina.    LOS: 1 day    Arvilla Meres, MD 05/06/2012, 11:00 AM

## 2012-05-08 ENCOUNTER — Telehealth: Payer: Self-pay | Admitting: Physician Assistant

## 2012-05-08 MED FILL — Sodium Chloride IV Soln 0.9%: INTRAVENOUS | Qty: 50 | Status: AC

## 2012-05-08 NOTE — Discharge Summary (Signed)
Agree with above. See my daily rounding note from the same day for additional details.  Truman Hayward 2:53 PM

## 2012-05-08 NOTE — Telephone Encounter (Signed)
BP high 179/97 @2 :15 and patient states has been running high since Saturday

## 2012-05-09 NOTE — Telephone Encounter (Signed)
DC meds reviewed. Start amlodipine 5 mg daily and reassess at f/u OV.

## 2012-05-09 NOTE — Telephone Encounter (Signed)
Discussed below with patient.  Recent d/c from [facility] - states BP elevated since coming home.  Yesterday morning - 167/95.  Yesterday evening at 2:15 - 179/97 & at 4:40 - 150/76.  Did have some chest discomfort and took NTG x 2 with relief afterwards.  States his BP did come down after that.  BP this morning before meds - 165/91 & 10:00 after meds - 168/95.  States that his chest does feel better this morning, but does have one area that feels sore.  Has eph visit scheduled for 4/23 with Gene Serpe, PA.

## 2012-05-09 NOTE — Telephone Encounter (Signed)
Please clarify below.  Did you mean to d/c both Lisinopril and Metoprolol?

## 2012-05-10 NOTE — Telephone Encounter (Signed)
Not at all.  °

## 2012-05-10 NOTE — Telephone Encounter (Signed)
Attempted to notify patient - voice maill not set up.

## 2012-05-11 DIAGNOSIS — E876 Hypokalemia: Secondary | ICD-10-CM | POA: Diagnosis not present

## 2012-05-11 DIAGNOSIS — E785 Hyperlipidemia, unspecified: Secondary | ICD-10-CM | POA: Diagnosis not present

## 2012-05-11 DIAGNOSIS — I10 Essential (primary) hypertension: Secondary | ICD-10-CM | POA: Diagnosis not present

## 2012-05-11 DIAGNOSIS — R0602 Shortness of breath: Secondary | ICD-10-CM | POA: Diagnosis not present

## 2012-05-11 DIAGNOSIS — Z7982 Long term (current) use of aspirin: Secondary | ICD-10-CM | POA: Diagnosis not present

## 2012-05-11 DIAGNOSIS — I214 Non-ST elevation (NSTEMI) myocardial infarction: Secondary | ICD-10-CM | POA: Diagnosis not present

## 2012-05-11 DIAGNOSIS — Z880 Allergy status to penicillin: Secondary | ICD-10-CM | POA: Diagnosis not present

## 2012-05-11 DIAGNOSIS — Z9861 Coronary angioplasty status: Secondary | ICD-10-CM | POA: Diagnosis not present

## 2012-05-11 DIAGNOSIS — R51 Headache: Secondary | ICD-10-CM | POA: Diagnosis not present

## 2012-05-11 DIAGNOSIS — E119 Type 2 diabetes mellitus without complications: Secondary | ICD-10-CM | POA: Diagnosis not present

## 2012-05-11 DIAGNOSIS — R61 Generalized hyperhidrosis: Secondary | ICD-10-CM | POA: Diagnosis not present

## 2012-05-11 DIAGNOSIS — H548 Legal blindness, as defined in USA: Secondary | ICD-10-CM | POA: Diagnosis not present

## 2012-05-11 DIAGNOSIS — Z91041 Radiographic dye allergy status: Secondary | ICD-10-CM | POA: Diagnosis not present

## 2012-05-11 DIAGNOSIS — Z8249 Family history of ischemic heart disease and other diseases of the circulatory system: Secondary | ICD-10-CM | POA: Diagnosis not present

## 2012-05-11 DIAGNOSIS — I252 Old myocardial infarction: Secondary | ICD-10-CM | POA: Diagnosis not present

## 2012-05-11 DIAGNOSIS — Z79899 Other long term (current) drug therapy: Secondary | ICD-10-CM | POA: Diagnosis not present

## 2012-05-11 DIAGNOSIS — R079 Chest pain, unspecified: Secondary | ICD-10-CM | POA: Diagnosis not present

## 2012-05-11 DIAGNOSIS — I209 Angina pectoris, unspecified: Secondary | ICD-10-CM | POA: Diagnosis not present

## 2012-05-11 NOTE — Telephone Encounter (Signed)
Left message to return call 

## 2012-05-12 ENCOUNTER — Other Ambulatory Visit: Payer: Self-pay | Admitting: Physician Assistant

## 2012-05-12 DIAGNOSIS — I200 Unstable angina: Secondary | ICD-10-CM

## 2012-05-12 DIAGNOSIS — R079 Chest pain, unspecified: Secondary | ICD-10-CM | POA: Diagnosis not present

## 2012-05-13 ENCOUNTER — Encounter (HOSPITAL_COMMUNITY): Payer: Self-pay | Admitting: *Deleted

## 2012-05-13 ENCOUNTER — Inpatient Hospital Stay (HOSPITAL_COMMUNITY)
Admission: AD | Admit: 2012-05-13 | Discharge: 2012-05-15 | DRG: 287 | Disposition: A | Discharge status: Home / Self Care | Payer: Medicare Other | Source: Other Acute Inpatient Hospital | Attending: Cardiology | Admitting: Cardiology

## 2012-05-13 DIAGNOSIS — Z6833 Body mass index (BMI) 33.0-33.9, adult: Secondary | ICD-10-CM

## 2012-05-13 DIAGNOSIS — I059 Rheumatic mitral valve disease, unspecified: Secondary | ICD-10-CM | POA: Diagnosis not present

## 2012-05-13 DIAGNOSIS — I200 Unstable angina: Secondary | ICD-10-CM

## 2012-05-13 DIAGNOSIS — I214 Non-ST elevation (NSTEMI) myocardial infarction: Secondary | ICD-10-CM | POA: Diagnosis present

## 2012-05-13 DIAGNOSIS — I10 Essential (primary) hypertension: Secondary | ICD-10-CM | POA: Diagnosis present

## 2012-05-13 DIAGNOSIS — E119 Type 2 diabetes mellitus without complications: Secondary | ICD-10-CM | POA: Diagnosis not present

## 2012-05-13 DIAGNOSIS — E785 Hyperlipidemia, unspecified: Secondary | ICD-10-CM | POA: Diagnosis not present

## 2012-05-13 DIAGNOSIS — I119 Hypertensive heart disease without heart failure: Secondary | ICD-10-CM | POA: Diagnosis not present

## 2012-05-13 DIAGNOSIS — E876 Hypokalemia: Secondary | ICD-10-CM | POA: Diagnosis present

## 2012-05-13 DIAGNOSIS — E669 Obesity, unspecified: Secondary | ICD-10-CM | POA: Diagnosis present

## 2012-05-13 DIAGNOSIS — I2510 Atherosclerotic heart disease of native coronary artery without angina pectoris: Secondary | ICD-10-CM | POA: Diagnosis not present

## 2012-05-13 DIAGNOSIS — R079 Chest pain, unspecified: Secondary | ICD-10-CM | POA: Diagnosis not present

## 2012-05-13 DIAGNOSIS — I209 Angina pectoris, unspecified: Secondary | ICD-10-CM | POA: Diagnosis not present

## 2012-05-13 DIAGNOSIS — R61 Generalized hyperhidrosis: Secondary | ICD-10-CM | POA: Diagnosis not present

## 2012-05-13 DIAGNOSIS — Z9861 Coronary angioplasty status: Secondary | ICD-10-CM

## 2012-05-13 DIAGNOSIS — I739 Peripheral vascular disease, unspecified: Secondary | ICD-10-CM | POA: Diagnosis not present

## 2012-05-13 DIAGNOSIS — R0602 Shortness of breath: Secondary | ICD-10-CM | POA: Diagnosis not present

## 2012-05-13 LAB — BASIC METABOLIC PANEL
BUN: 6 mg/dL (ref 6–23)
Creatinine, Ser: 0.76 mg/dL (ref 0.50–1.35)
GFR calc Af Amer: >90 mL/min (ref >90)
GFR calc non Af Amer: >90 mL/min (ref >90)
Glucose, Bld: 215 mg/dL — ABNORMAL HIGH (ref 70–99)

## 2012-05-13 LAB — GLUCOSE, CAPILLARY: Glucose-Capillary: 131 mg/dL — ABNORMAL HIGH (ref 70–99)

## 2012-05-13 LAB — TROPONIN I: Troponin I: <0.3 ng/mL (ref <0.30)

## 2012-05-13 MED ORDER — ASPIRIN EC 81 MG PO TBEC
81.0000 mg | DELAYED_RELEASE_TABLET | Freq: Every day | ORAL | Status: DC
  Administered 2012-05-14: 81 mg via ORAL
  Filled 2012-05-13 (×2): qty 1

## 2012-05-13 MED ORDER — FAMOTIDINE 20 MG PO TABS
20.0000 mg | ORAL_TABLET | Freq: Two times a day (BID) | ORAL | Status: AC
  Administered 2012-05-14 – 2012-05-15 (×2): 20 mg via ORAL
  Filled 2012-05-13 (×2): qty 1

## 2012-05-13 MED ORDER — ASPIRIN 81 MG PO CHEW
324.0000 mg | CHEWABLE_TABLET | ORAL | Status: AC
  Administered 2012-05-15: 324 mg via ORAL
  Filled 2012-05-13: qty 4

## 2012-05-13 MED ORDER — HEPARIN BOLUS VIA INFUSION
4000.0000 [IU] | Freq: Once | INTRAVENOUS | Status: AC
  Administered 2012-05-13: 4000 [IU] via INTRAVENOUS
  Filled 2012-05-13: qty 4000

## 2012-05-13 MED ORDER — NITROGLYCERIN 0.4 MG SL SUBL: 0.4000 mg | SUBLINGUAL_TABLET | SUBLINGUAL | Status: DC | PRN

## 2012-05-13 MED ORDER — SODIUM CHLORIDE 0.9 % IJ SOLN
3.0000 mL | Freq: Two times a day (BID) | INTRAMUSCULAR | Status: DC
  Administered 2012-05-13 – 2012-05-14 (×2): 3 mL via INTRAVENOUS

## 2012-05-13 MED ORDER — ACETAMINOPHEN 325 MG PO TABS
650.0000 mg | ORAL_TABLET | ORAL | Status: DC | PRN
  Administered 2012-05-14 (×2): 650 mg via ORAL
  Filled 2012-05-13 (×2): qty 2

## 2012-05-13 MED ORDER — LISINOPRIL 40 MG PO TABS
40.0000 mg | ORAL_TABLET | Freq: Every day | ORAL | Status: DC
  Administered 2012-05-13 – 2012-05-15 (×3): 40 mg via ORAL
  Filled 2012-05-13 (×3): qty 1

## 2012-05-13 MED ORDER — AMLODIPINE BESYLATE 5 MG PO TABS
5.0000 mg | ORAL_TABLET | Freq: Every day | ORAL | Status: DC
  Administered 2012-05-13: 5 mg via ORAL
  Filled 2012-05-13: qty 1

## 2012-05-13 MED ORDER — SODIUM CHLORIDE 0.9 % IJ SOLN: 3.0000 mL | INTRAMUSCULAR | Status: DC | PRN

## 2012-05-13 MED ORDER — GLIMEPIRIDE 1 MG PO TABS
1.0000 mg | ORAL_TABLET | Freq: Every day | ORAL | Status: DC
  Administered 2012-05-14 – 2012-05-15 (×2): 1 mg via ORAL
  Filled 2012-05-13 (×3): qty 1

## 2012-05-13 MED ORDER — INSULIN ASPART 100 UNIT/ML ~~LOC~~ SOLN
0.0000 [IU] | Freq: Three times a day (TID) | SUBCUTANEOUS | Status: DC
  Administered 2012-05-14: 2 [IU] via SUBCUTANEOUS
  Administered 2012-05-14: 3 [IU] via SUBCUTANEOUS
  Administered 2012-05-14: 2 [IU] via SUBCUTANEOUS
  Administered 2012-05-15: 8 [IU] via SUBCUTANEOUS

## 2012-05-13 MED ORDER — NITROGLYCERIN 2 % TD OINT
1.0000 [in_us] | TOPICAL_OINTMENT | Freq: Four times a day (QID) | TRANSDERMAL | Status: DC
  Administered 2012-05-13 – 2012-05-15 (×8): 1 [in_us] via TOPICAL
  Filled 2012-05-13: qty 30

## 2012-05-13 MED ORDER — ATORVASTATIN CALCIUM 80 MG PO TABS
80.0000 mg | ORAL_TABLET | Freq: Every day | ORAL | Status: DC
  Administered 2012-05-13 – 2012-05-14 (×2): 80 mg via ORAL
  Filled 2012-05-13 (×3): qty 1

## 2012-05-13 MED ORDER — METOPROLOL SUCCINATE ER 100 MG PO TB24
100.0000 mg | ORAL_TABLET | Freq: Two times a day (BID) | ORAL | Status: DC
  Administered 2012-05-13 (×2): 100 mg via ORAL
  Filled 2012-05-13 (×4): qty 1

## 2012-05-13 MED ORDER — ASPIRIN 81 MG PO CHEW: 324.0000 mg | CHEWABLE_TABLET | ORAL | Status: DC

## 2012-05-13 MED ORDER — POTASSIUM CHLORIDE CRYS ER 20 MEQ PO TBCR
20.0000 meq | EXTENDED_RELEASE_TABLET | Freq: Every day | ORAL | Status: DC
  Administered 2012-05-13 – 2012-05-15 (×3): 20 meq via ORAL
  Filled 2012-05-13 (×3): qty 1

## 2012-05-13 MED ORDER — PREDNISONE 50 MG PO TABS: 60.0000 mg | ORAL_TABLET | ORAL | Status: DC

## 2012-05-13 MED ORDER — ONDANSETRON HCL 4 MG/2ML IJ SOLN: 4.0000 mg | Freq: Four times a day (QID) | INTRAMUSCULAR | Status: DC | PRN

## 2012-05-13 MED ORDER — KETOCONAZOLE 2 % EX CREA
1.0000 "application " | TOPICAL_CREAM | Freq: Two times a day (BID) | CUTANEOUS | Status: DC | PRN
  Filled 2012-05-13: qty 15

## 2012-05-13 MED ORDER — HEPARIN (PORCINE) IN NACL 100-0.45 UNIT/ML-% IJ SOLN
1700.0000 [IU]/h | INTRAMUSCULAR | Status: DC
  Administered 2012-05-13: 1300 [IU]/h via INTRAVENOUS
  Administered 2012-05-14: 1500 [IU]/h via INTRAVENOUS
  Filled 2012-05-13 (×4): qty 250

## 2012-05-13 MED ORDER — PRASUGREL HCL 10 MG PO TABS
10.0000 mg | ORAL_TABLET | Freq: Every day | ORAL | Status: DC
  Administered 2012-05-13 – 2012-05-15 (×3): 10 mg via ORAL
  Filled 2012-05-13 (×3): qty 1

## 2012-05-13 MED ORDER — SODIUM CHLORIDE 0.9 % IV SOLN: 250.0000 mL | INTRAVENOUS | Status: DC | PRN

## 2012-05-13 MED ORDER — AMLODIPINE BESYLATE 5 MG PO TABS
5.0000 mg | ORAL_TABLET | Freq: Once | ORAL | Status: AC
  Administered 2012-05-13: 5 mg via ORAL
  Filled 2012-05-13: qty 1

## 2012-05-13 MED ORDER — DIPHENHYDRAMINE HCL 50 MG/ML IJ SOLN
25.0000 mg | INTRAMUSCULAR | Status: AC
  Administered 2012-05-15: 25 mg via INTRAVENOUS
  Filled 2012-05-13: qty 0.5

## 2012-05-13 MED ORDER — SODIUM CHLORIDE 0.9 % IV SOLN: INTRAVENOUS | Status: DC

## 2012-05-13 MED ORDER — SODIUM CHLORIDE 0.9 % IJ SOLN: 3.0000 mL | Freq: Two times a day (BID) | INTRAMUSCULAR | Status: DC

## 2012-05-13 MED ORDER — AMLODIPINE BESYLATE 10 MG PO TABS
10.0000 mg | ORAL_TABLET | Freq: Every day | ORAL | Status: DC
  Administered 2012-05-14: 10 mg via ORAL
  Filled 2012-05-13 (×2): qty 1

## 2012-05-13 NOTE — Progress Notes (Signed)
Patient ID: Sean Villarreal, male   DOB: 1940/09/29, 72 y.o.   MRN: [id number]    SUBJECTIVE:71 yo with history of CAD and recent unstable angina discharged about a week ago from [facility] after PCI to 99% ostial LCx and 99% mid LAD with DES returned to [facility] yesterday with ongoing chest pain.  He has been having chest pain since Monday, it tends to be exertional and is reminiscent of the pain prior to his PCIs.  The pain is responsive to nitrates.  His BP has also been running very high.  Currently, he is CP-free on NTG paste.  Cardiac enzymes were borderline elevated at [facility] and ECG was nonacute.  He was seen yesterday by Dr. Myrtis Villarreal who decided on re-look catheterization.    [name] ON 05/14/2012] amLODipine  10 mg Oral Daily  . amLODipine  5 mg Oral Once  . [START ON 05/15/2012] aspirin  324 mg Oral Pre-Cath  . [START ON 05/14/2012] aspirin EC  81 mg Oral Daily  . atorvastatin  80 mg Oral q1800  . [START ON 05/15/2012] diphenhydrAMINE  25 mg Intravenous On Call  . [START ON 05/14/2012] famotidine  20 mg Oral BID  . [START ON 05/14/2012] glimepiride  1 mg Oral Q breakfast  . lisinopril  40 mg Oral Daily  . metoprolol succinate  100 mg Oral BID  . nitroGLYCERIN  1 inch Topical Q6H  . potassium chloride  20 mEq Oral Daily  . prasugrel  10 mg Oral Daily  . [START ON 05/14/2012] predniSONE  60 mg Oral Pre-Cath   Followed by  . [START ON 05/17/2012] predniSONE  60 mg Oral Pre-Cath  . sodium chloride  3 mL Intravenous Q12H  heparin gtt   Filed Vitals:   05/13/12 1036  BP: 191/92  Pulse: 71  Temp: 98.2 F (36.8 C)  TempSrc: Oral  Resp: 17  Height: 5\' 9"  (1.753 m)  Weight: 226 lb 8 oz (102.74 kg)  SpO2: 96%    Intake/Output Summary (Last 24 hours) at 05/13/12 1404 Last data filed at 05/13/12 1000  Gross per 24 hour  Intake      0 ml  Output    150 ml  Net   -150 ml    LABS: Basic Metabolic Panel:  Recent Labs  86/57/84 1130  NA 139  K 3.5  CL 103  CO2 28  GLUCOSE 215*   BUN 6  CREATININE 0.76  CALCIUM 8.9   RADIOLOGY: No results found.  PHYSICAL EXAM General: NAD, obese Neck: No JVD, no thyromegaly or thyroid nodule.  Lungs: Clear to auscultation bilaterally with normal respiratory effort. CV: Nondisplaced PMI.  Heart regular S1/S2, +S4, no murmur.  No peripheral edema.  No carotid bruit.  Normal pedal pulses.  Abdomen: Soft, nontender, no hepatosplenomegaly, no distention.  Neurologic: Alert and oriented x 3.  Psych: Normal affect. Extremities: No clubbing or cyanosis.   TELEMETRY: Reviewed telemetry pt in NSR  ASSESSMENT AND PLAN: 72 yo with history of HTN and recent unstable angina with DES to mLAD and ostial LCx presented to [facility] with ongoing chest pain responsive to nitrates and hypertensive urgency.  TnI was borderline elevated. 1. CAD: Recent PCI to ostial LCx and mid LAD.  Concern for unstable angina currently.  However, he has been taking his meds and ECG was nonacute (per Dr. Myrtis Villarreal, need repeat ECG here).  Agree with re-look cath.  If this is unremarkable, symptoms may be due to BP.  - Cath on  Monday. - ECG and cycle cardiac enzymes.  - Continue ASA, Effient, statin.  2. HTN: BP still quite high.  Will increase amlodipine to 10 mg daily and give extra 5 mg today to equal 10.  Continue current Toprol XL and lisinopril.    Sean Villarreal 05/13/2012 2:11 PM

## 2012-05-13 NOTE — Consult Note (Signed)
ANTICOAGULATION CONSULT NOTE - Initial Consult  Pharmacy Consult for Heparin Indication: chest pain/ACS  Allergies  Allergen Reactions  . Penicillins     Passed out as a child  . Contrast Media (Iodinated Diagnostic Agents)     Had "welts" on arm, and kidney issues after given dye in the past    Patient Measurements: Height: 5\' 9"  (175.3 cm) Weight: 226 lb 8 oz (102.74 kg) IBW/kg (Calculated) : 70.7 Heparin Dosing Weight: 92kg  Vital Signs: Temp: 98.2 F (36.8 C) (04/19 1036) Temp src: Oral (04/19 1036) BP: 191/92 mmHg (04/19 1036) Pulse Rate: 71 (04/19 1036)  Labs:  Estimated Creatinine Clearance: 86.4 ml/min (by C-G formula based on Cr of 0.94).   Medical History: Past Medical History  Diagnosis Date  . Hypertension   . Coronary artery disease   . Diabetes mellitus without complication     Medications:  No anticoagulants pta  Assessment: 71yom with recent admission for NSTEMI s/p PTCA/DES x 1 to mid LAD and PTCA/DES x 1 to ostial LCx (05/06/12) returns to the hospital with CP. He will begin IV heparin. No labs have been drawn yet, but CBC and BMET were wnl from previous admission.  Goal of Therapy:  Heparin level 0.3-0.7 units/ml Monitor platelets by anticoagulation protocol: Yes   Plan:  1) Heparin bolus 4000 units x 1 2) Heparin drip at 1300 units/hr 3) 6 hour heparin level 4) Daily heparin level and CBC  Fredrik Rigger 05/13/2012,10:40 AM

## 2012-05-13 NOTE — Consult Note (Signed)
ANTICOAGULATION CONSULT NOTE  Pharmacy Consult for Heparin Indication: chest pain/ACS  Allergies  Allergen Reactions  . Penicillins     Passed out as a child  . Contrast Media (Iodinated Diagnostic Agents)     Had "welts" on arm, and kidney issues after given dye in the past   Labs:  Estimated Creatinine Clearance: 101.5 ml/min (by C-G formula based on Cr of 0.76).   Medical History: Past Medical History  Diagnosis Date  . Hypertension   . Coronary artery disease   . Diabetes mellitus without complication     Medications:  No anticoagulants pta  Assessment: 71yom with recent admission for NSTEMI s/p PTCA/DES x 1 to mid LAD and PTCA/DES x 1 to ostial LCx (05/06/12) returns to the hospital with CP. He will begin IV heparin.   Heparin level = 0.28  Goal of Therapy:  Heparin level 0.3-0.7 units/ml Monitor platelets by anticoagulation protocol: Yes   Plan:  1) Increase heparin to 1500 units / hr 2) Follow up AM labs  Thank you. Okey Regal, PharmD [phone] Elwin Sleight 05/13/2012,6:44 PM

## 2012-05-14 DIAGNOSIS — E785 Hyperlipidemia, unspecified: Secondary | ICD-10-CM | POA: Diagnosis present

## 2012-05-14 DIAGNOSIS — I059 Rheumatic mitral valve disease, unspecified: Secondary | ICD-10-CM

## 2012-05-14 DIAGNOSIS — I10 Essential (primary) hypertension: Secondary | ICD-10-CM | POA: Diagnosis present

## 2012-05-14 DIAGNOSIS — E876 Hypokalemia: Secondary | ICD-10-CM

## 2012-05-14 LAB — GLUCOSE, CAPILLARY
Glucose-Capillary: 140 mg/dL — ABNORMAL HIGH (ref 70–99)
Glucose-Capillary: 173 mg/dL — ABNORMAL HIGH (ref 70–99)
Glucose-Capillary: 292 mg/dL — ABNORMAL HIGH (ref 70–99)

## 2012-05-14 LAB — TROPONIN I: Troponin I: <0.3 ng/mL (ref <0.30)

## 2012-05-14 LAB — BASIC METABOLIC PANEL
CO2: 28 mEq/L (ref 19–32)
Chloride: 104 mEq/L (ref 96–112)
Sodium: 141 mEq/L (ref 135–145)

## 2012-05-14 LAB — CBC
MCV: 86.2 fL (ref 78.0–100.0)
Platelets: 179 10*3/uL (ref 150–400)
RBC: 4.19 MIL/uL — ABNORMAL LOW (ref 4.22–5.81)
WBC: 11 10*3/uL — ABNORMAL HIGH (ref 4.0–10.5)

## 2012-05-14 MED ORDER — PREDNISONE 50 MG PO TABS
60.0000 mg | ORAL_TABLET | Freq: Four times a day (QID) | ORAL | Status: DC
  Administered 2012-05-14 – 2012-05-15 (×3): 60 mg via ORAL
  Filled 2012-05-14 (×7): qty 1

## 2012-05-14 MED ORDER — PREDNISONE 50 MG PO TABS
60.0000 mg | ORAL_TABLET | ORAL | Status: DC
  Filled 2012-05-14: qty 1

## 2012-05-14 MED ORDER — CARVEDILOL 25 MG PO TABS
25.0000 mg | ORAL_TABLET | Freq: Two times a day (BID) | ORAL | Status: DC
  Administered 2012-05-14 – 2012-05-15 (×3): 25 mg via ORAL
  Filled 2012-05-14 (×5): qty 1

## 2012-05-14 MED ORDER — SODIUM CHLORIDE 0.9 % IV SOLN
INTRAVENOUS | Status: DC
  Administered 2012-05-15: 04:00:00 via INTRAVENOUS

## 2012-05-14 NOTE — Progress Notes (Signed)
Patient Name: Sean Villarreal Date of Encounter: 05/14/2012   Principal Problem:   Unstable angina Active Problems:   Coronary artery disease   Hypertension   Diabetes mellitus without complication   SUBJECTIVE  No chest pain overnight.  CURRENT MEDS . amLODipine  10 mg Oral Daily  . [START ON 05/15/2012] aspirin  324 mg Oral Pre-Cath  . aspirin EC  81 mg Oral Daily  . atorvastatin  80 mg Oral q1800  . [START ON 05/15/2012] diphenhydrAMINE  25 mg Intravenous On Call  . famotidine  20 mg Oral BID  . glimepiride  1 mg Oral Q breakfast  . insulin aspart  0-15 Units Subcutaneous TID WC  . lisinopril  40 mg Oral Daily  . metoprolol succinate  100 mg Oral BID  . nitroGLYCERIN  1 inch Topical Q6H  . potassium chloride  20 mEq Oral Daily  . prasugrel  10 mg Oral Daily  . predniSONE  60 mg Oral Q6H  . [START ON 05/15/2012] predniSONE  60 mg Oral Pre-Cath  . sodium chloride  3 mL Intravenous Q12H   OBJECTIVE  Filed Vitals:   05/13/12 1624 05/13/12 1841 05/13/12 2100 05/14/12 0500  BP: 165/84 164/78 167/88 154/83  Pulse: 79 78 73 68  Temp:   98.1 F (36.7 C) 98.1 F (36.7 C)  TempSrc:      Resp:   20 20  Height:      Weight:      SpO2:   95% 94%    Intake/Output Summary (Last 24 hours) at 05/14/12 0957 Last data filed at 05/14/12 0956  Gross per 24 hour  Intake 1256.88 ml  Output   2050 ml  Net -793.12 ml   Filed Weights   05/13/12 1036  Weight: 226 lb 8 oz (102.74 kg)    PHYSICAL EXAM  General: Pleasant, NAD. Neuro: Alert and oriented X 3. Moves all extremities spontaneously. Psych: Normal affect. HEENT:  Normal  Neck: Supple without bruits or JVD. Lungs:  Resp regular and unlabored, CTA. Heart: RRR no s3, s4, or murmurs. Abdomen: Soft, non-tender, non-distended, BS + x 4.  Extremities: No clubbing, cyanosis or edema. DP/PT/Radials 2+ and equal bilaterally.  Accessory Clinical Findings  CBC  Recent Labs  05/14/12 0233  WBC 11.0*  HGB 12.9*  HCT  36.1*  MCV 86.2  PLT 179   Basic Metabolic Panel  Recent Labs  05/13/12 1130 05/14/12 0233  NA 139 141  K 3.5 3.1*  CL 103 104  CO2 28 28  GLUCOSE 215* 131*  BUN 6 7  CREATININE 0.76 0.79  CALCIUM 8.9 8.8   Cardiac Enzymes  Recent Labs  05/13/12 1447 05/13/12 2025 05/14/12 0233  TROPONINI <0.30 <0.30 <0.30   TELE  rsr  ECG  Rsr, 66, no acute st/t changes.  Radiology/Studies  No results found.  ASSESSMENT AND PLAN  1.  USA/CAD:  No further chest pain on nitropaste and heparin.  For cath tomorrow.  Cont asa, effient, statin, bb, acei, ccb.  Prednisone ordered for dye prophylaxis.  2.  HTN:  Poorly controlled.  Trending in 160's.  He is on high dose lisinopril, norvasc, and bb.  Will switch toprol xl to carvedilol for better antihypertensive effect.  Could add chlorthalidone next.  3.  HL:  Cont statin.  4.  Hypokalemia:  Supp.  5.  DM:  Cot amaryl/ssi.  Signed, Nicolasa Ducking NP   Attending Note:   The patient was seen and examined.  Agree  with assessment and plan as noted above.  Changes made to the above note as needed.  Pt is doing well.  Pain free at present.  For cath tomorrow.    Echo was being done as I examined him this am.  LV function looks normal.  Vesta Mixer, Montez Hageman., MD, [facility] 05/14/2012, 10:17 AM

## 2012-05-14 NOTE — Progress Notes (Signed)
  Echocardiogram 2D Echocardiogram has been performed.  Sean Villarreal FRANCES 05/14/2012, 12:20 PM

## 2012-05-14 NOTE — Consult Note (Signed)
ANTICOAGULATION CONSULT NOTE Pharmacy Consult for Heparin Indication: chest pain/ACS  Allergies  Allergen Reactions  . Penicillins     Passed out as a child  . Contrast Media (Iodinated Diagnostic Agents)     Had "welts" on arm, and kidney issues after given dye in the past   Labs:  Estimated Creatinine Clearance: 101.5 ml/min (by C-G formula based on Cr of 0.76).  Assessment: 72 yo male with ACS, awaiting cath Monday, for heparin  Goal of Therapy:  Heparin level 0.3-0.7 units/ml Monitor platelets by anticoagulation protocol: Yes   Plan:  Increase Heparin 1700 units/hr  Lakitha Gordy, Gary Fleet 05/14/2012,3:30 AM

## 2012-05-15 ENCOUNTER — Encounter: Payer: Self-pay | Admitting: *Deleted

## 2012-05-15 ENCOUNTER — Ambulatory Visit (HOSPITAL_COMMUNITY): Admit: 2012-05-15 | Payer: Self-pay | Admitting: Cardiology

## 2012-05-15 ENCOUNTER — Encounter (HOSPITAL_COMMUNITY)
Admission: AD | Disposition: A | Discharge status: Home / Self Care | Payer: Self-pay | Source: Other Acute Inpatient Hospital | Attending: Cardiology

## 2012-05-15 ENCOUNTER — Encounter: Payer: Self-pay | Admitting: Physician Assistant

## 2012-05-15 ENCOUNTER — Encounter: Payer: Self-pay | Admitting: Cardiovascular Disease

## 2012-05-15 DIAGNOSIS — I739 Peripheral vascular disease, unspecified: Secondary | ICD-10-CM

## 2012-05-15 DIAGNOSIS — I2510 Atherosclerotic heart disease of native coronary artery without angina pectoris: Secondary | ICD-10-CM

## 2012-05-15 HISTORY — PX: LEFT HEART CATHETERIZATION WITH CORONARY ANGIOGRAM: SHX5451

## 2012-05-15 LAB — BASIC METABOLIC PANEL
CO2: 26 mEq/L (ref 19–32)
Calcium: 9.3 mg/dL (ref 8.4–10.5)
Chloride: 100 mEq/L (ref 96–112)
Glucose, Bld: 279 mg/dL — ABNORMAL HIGH (ref 70–99)
Sodium: 138 mEq/L (ref 135–145)

## 2012-05-15 LAB — CBC
HCT: 41 % (ref 39.0–52.0)
Hemoglobin: 14.9 g/dL (ref 13.0–17.0)
MCH: 31.2 pg (ref 26.0–34.0)
MCHC: 36.3 g/dL — ABNORMAL HIGH (ref 30.0–36.0)

## 2012-05-15 LAB — GLUCOSE, CAPILLARY
Glucose-Capillary: 254 mg/dL — ABNORMAL HIGH (ref 70–99)
Glucose-Capillary: 249 mg/dL — ABNORMAL HIGH (ref 70–99)

## 2012-05-15 SURGERY — LEFT HEART CATHETERIZATION WITH CORONARY ANGIOGRAM
Anesthesia: LOCAL

## 2012-05-15 MED ORDER — POTASSIUM CHLORIDE CRYS ER 20 MEQ PO TBCR: 20.0000 meq | EXTENDED_RELEASE_TABLET | Freq: Every day | ORAL | Status: DC

## 2012-05-15 MED ORDER — METFORMIN HCL 500 MG PO TABS: 500.0000 mg | ORAL_TABLET | Freq: Every day | ORAL | Status: DC

## 2012-05-15 MED ORDER — ISOSORBIDE MONONITRATE ER 30 MG PO TB24
30.0000 mg | ORAL_TABLET | Freq: Every day | ORAL | Status: DC
  Administered 2012-05-15: 30 mg via ORAL
  Filled 2012-05-15: qty 1

## 2012-05-15 MED ORDER — MIDAZOLAM HCL 2 MG/2ML IJ SOLN
INTRAMUSCULAR | Status: AC
  Filled 2012-05-15: qty 2

## 2012-05-15 MED ORDER — LISINOPRIL 40 MG PO TABS: 40.0000 mg | ORAL_TABLET | Freq: Every day | ORAL | Status: DC

## 2012-05-15 MED ORDER — POTASSIUM CHLORIDE CRYS ER 20 MEQ PO TBCR
40.0000 meq | EXTENDED_RELEASE_TABLET | Freq: Once | ORAL | Status: AC
  Administered 2012-05-15: 40 meq via ORAL
  Filled 2012-05-15: qty 2

## 2012-05-15 MED ORDER — LIDOCAINE HCL (PF) 1 % IJ SOLN
INTRAMUSCULAR | Status: AC
  Filled 2012-05-15: qty 30

## 2012-05-15 MED ORDER — FENTANYL CITRATE 0.05 MG/ML IJ SOLN
INTRAMUSCULAR | Status: AC
  Filled 2012-05-15: qty 2

## 2012-05-15 MED ORDER — CARVEDILOL 25 MG PO TABS: 25.0000 mg | ORAL_TABLET | Freq: Two times a day (BID) | ORAL | Status: DC

## 2012-05-15 MED ORDER — HEPARIN (PORCINE) IN NACL 2-0.9 UNIT/ML-% IJ SOLN
INTRAMUSCULAR | Status: AC
  Filled 2012-05-15: qty 1000

## 2012-05-15 MED ORDER — ACETAMINOPHEN 325 MG PO TABS: 650.0000 mg | ORAL_TABLET | ORAL | Status: DC | PRN

## 2012-05-15 MED ORDER — SODIUM CHLORIDE 0.9 % IV SOLN
INTRAVENOUS | Status: AC
  Administered 2012-05-15: 12:00:00 via INTRAVENOUS

## 2012-05-15 MED ORDER — ISOSORBIDE MONONITRATE ER 30 MG PO TB24: 30.0000 mg | ORAL_TABLET | Freq: Every day | ORAL | Status: DC

## 2012-05-15 MED FILL — Heparin Sodium (Porcine) 100 Unt/ML in Sodium Chloride 0.45%: INTRAMUSCULAR | Qty: 250 | Status: AC

## 2012-05-15 NOTE — Progress Notes (Signed)
Inpatient Diabetes Program Recommendations  AACE/ADA: New Consensus Statement on Inpatient Glycemic Control (2013)  Target Ranges:  Prepandial:   less than 140 mg/dL      Peak postprandial:   less than 180 mg/dL (1-2 hours)      Critically ill patients:  140 - 180 mg/dL    Results for CHIRON, CAMPIONE (MRN [id number]) as of 05/15/2012 11:19  Ref. Range 05/14/2012 23:45 05/15/2012 04:12 05/15/2012 07:27  Glucose-Capillary Latest Range: 70-99 mg/dL 284 (H) 132 (H) 440 (H)   Noted patient s/p cardiac cath today.  CBGs all >200 mg/dl this morning.    Likely due to the fact that patient received 60 mg Prednisone x3 doses before cardiac cath.  Will follow and assist while inpatient.  Ambrose Finland RN, MSN, CDE Diabetes Coordinator Inpatient Diabetes Program [phone]

## 2012-05-15 NOTE — Interval H&P Note (Signed)
History and Physical Interval Note:  05/15/2012 8:32 AM  Sean Villarreal  has presented today for cardiac cath with the diagnosis of CAD, recurrent chest pain. The various methods of treatment have been discussed with the patient and family. After consideration of risks, benefits and other options for treatment, the patient has consented to  Procedure(s): LEFT HEART CATHETERIZATION WITH CORONARY ANGIOGRAM (N/A) as a surgical intervention .  The patient's history has been reviewed, patient examined, no change in status, stable for surgery.  I have reviewed the patient's chart and labs.  Questions were answered to the patient's satisfaction.     MCALHANY,CHRISTOPHER

## 2012-05-15 NOTE — Discharge Summary (Signed)
See cath note today. cdm 

## 2012-05-15 NOTE — Discharge Summary (Signed)
CARDIOLOGY DISCHARGE SUMMARY   Patient ID: Sean Villarreal MRN: [id number] DOB/AGE: 1940/11/26 72 y.o.  Admit date: 05/13/2012 Discharge date: 05/15/2012  Primary Discharge Diagnosis:     Unstable angina, see cath report, medical therapy recommended Secondary Discharge Diagnosis:    Coronary artery disease   Hypertension   Diabetes mellitus without complication   Hypokalemia   Dyslipidemia  Procedure Performed:  1. Left Heart Catheterization 2. Selective Coronary Angiography 3. Left ventricular angiogram 4. Distal aortogram with visualization of both renal arteries 5. Mynx femoral artery closure device right femoral artery 6. 2-D echocardiogram  Hospital Course: Sean Villarreal is a 72 y.o. male with a history of CAD. He had recent PCI to the circumflex and LAD. He went to [facility] with ongoing chest pain. His cardiac enzymes were borderline elevated and he was transferred to [facility] cone for further evaluation and treatment.  His cardiac enzymes here were negative. His ECG did not show ST elevation. He was anticoagulated with heparin and continued on home medications of aspirin, Effient, beta blocker and statin. His potassium was 3.0 at [facility] and was supplemented. It dropped again here, so was supplemented and daily dosing was initiated. His BP medications were changed and up-titrated for better BP control. The current list is below.  His chest pain was concerning for unstable/progressive anginal pain so he was taken to the cath lab on 05/15/2012. Full results are below. Medical therapy was recommended for coronary artery disease. He had Imdur added to his medication regimen.  Post-cath on 05/15/2012, Sean Villarreal was ambulating without chest pain or shortness of breath. His cath site was without hematoma. He is considered stable for discharge, to follow up as an outpatient in [place].  Labs:   Lab Results  Component Value Date   WBC 11.2* 05/15/2012   HGB 14.9  05/15/2012   HCT 41.0 05/15/2012   MCV 85.8 05/15/2012   PLT 215 05/15/2012     Recent Labs Lab 05/15/12 0515  NA 138  K 3.6  CL 100  CO2 26  BUN 9  CREATININE 0.68  CALCIUM 9.3  GLUCOSE 279*    Recent Labs  05/13/12 1447 05/13/12 2025 05/14/12 0233  TROPONINI <0.30 <0.30 <0.30   Cardiac Cath:  05/15/2012 Left main: Distal 30% stenosis.  Left Anterior Descending Artery: Large caliber vessel that courses to the apex. The proximal and mid vessel has severe calcification. The proximal vessel has diffuse 30% stenosis. The mid vessel has a patent stent. The mid vessel has serial 40% stenoses with diffuse calcification. The distal vessel has a focal 60-70% stenosis. The first diagonal branch is very small in caliber (1.5 mm) and has an ostial 80% stenosis. The second diagonal branch is small in caliber and has a 50% ostial stenosis.  Circumflex Artery: Moderate caliber vessel with patent ostial stent. There is moderate calcification of the proximal and mid vessel. The first obtuse marginal branch is moderate in caliber and has 40% ostial stenosis. There is an intermediate branch  Right Coronary Artery: Large dominant vessel with mild luminal irregularities.  Left Ventricular Angiogram: LVEF 50%  Distal Aortogram: Patent bilateral renal arteries with no evidence of obstructive disease.  Impression:  1. Double vessel CAD with patent stents mid LAD and ostial Circumflex.  2. Severe stenosis in the ostium of the small caliber ramus intermediate branch, not favorable for PCI due to vessel size and ostial location of lesion with ostial Circumflex stent.  3. Moderate stenosis distal LAD, does not  appear to be flow limiting.  4. Low normal LV systolic function.  5. Patent renal arteries with no evidence of obstructive disease.  Recommendations: He will need continued dual anti-platelet therapy for at least one year. Aggressive risk factor modification. He will be started on Imdur 30 mg po  Qdaily. I would suspect that he will have some angina from his disease in the intermediate branch but this vessel is not favorable for PCI. Hopefully we can manage his angina with medications. Discharge home later today. Cancel f/u on Wednesday in [place] office. He will need f/u in 2 weeks with Dr. Myrtis Ser in [place] office.    EKG: 15-May-2012 04:14:14 Sinus rhythm with frequent Premature ventricular complexes Prolonged QT Abnormal ECG 43mm/s 99mm/mV 100Hz  8.0.1 12SL 241 HD CID: 1 Referred by: Unconfirmed Vent. rate 78 BPM PR interval 150 ms QRS duration 76 ms QT/QTc 428/487 ms P-R-T axes 50 9 27   Echo: 05/14/2012 Conclusions Left ventricle: The cavity size was normal. Wall thickness was increased in a pattern of mild LVH. Systolic function was normal. The estimated ejection fraction was in the range of 55% to 60%. Wall motion was normal; there were no regional wall motion abnormalities. Doppler parameters are consistent with abnormal left ventricular relaxation (grade 1 diastolic dysfunction). - Aortic valve: Mildly calcified annulus. Trileaflet; mildly calcified leaflets. - Mitral valve: Calcified annulus. Mild regurgitation. - Right ventricle: The cavity size was mildly dilated. - Right atrium: The atrium was mildly dilated. - Tricuspid valve: Mild regurgitation. - Pulmonary arteries: PA peak pressure: 36mm Hg (S). - Pericardium, extracardiac: There was no pericardial effusion.    FOLLOW UP PLANS AND APPOINTMENTS Allergies  Allergen Reactions  . Penicillins     Passed out as a child  . Contrast Media (Iodinated Diagnostic Agents)     Had "welts" on arm, and kidney issues after given dye in the past     Medication List    STOP taking these medications       metoprolol succinate 100 MG 24 hr tablet  Commonly known as:  TOPROL-XL      TAKE these medications       aspirin 81 MG tablet  Take 81 mg by mouth every morning.     atorvastatin 80 MG tablet  Commonly known  as:  LIPITOR  Take 80 mg by mouth at bedtime.     carvedilol 25 MG tablet  Commonly known as:  COREG  Take 1 tablet (25 mg total) by mouth 2 (two) times daily with a meal.     glimepiride 1 MG tablet  Commonly known as:  AMARYL  Take 1 mg by mouth daily before breakfast.     Hypromellose 0.3 % Gel  Apply 1 application to eye 2 (two) times daily as needed. Medication:Genteal Eye Gel  For dry irritated eyes     isosorbide mononitrate 30 MG 24 hr tablet  Commonly known as:  IMDUR  Take 1 tablet (30 mg total) by mouth daily.     ketoconazole 2 % cream  Commonly known as:  NIZORAL  Apply 1 application topically 2 (two) times daily as needed (apply to face as needed (uses during the winter)). For dry skin     lisinopril 40 MG tablet  Commonly known as:  PRINIVIL,ZESTRIL  Take 1 tablet (40 mg total) by mouth daily.     metFORMIN 500 MG tablet  Commonly known as:  GLUCOPHAGE  Take 1 tablet (500 mg total) by mouth daily with breakfast. HOLD  for 3 days. RESTART on 05/18/2012.     nitroGLYCERIN 0.4 MG SL tablet  Commonly known as:  NITROSTAT  Place 1 tablet (0.4 mg total) under the tongue every 5 (five) minutes x 3 doses as needed for chest pain.     potassium chloride SA 20 MEQ tablet  Commonly known as:  K-DUR,KLOR-CON  Take 1 tablet (20 mEq total) by mouth daily.     prasugrel 10 MG Tabs  Commonly known as:  EFFIENT  Take 1 tablet (10 mg total) by mouth daily.        Discharge Orders   Future Appointments Provider Department Dept Phone   05/29/2012 1:40 PM Prescott Parma, PA-C [facility] [place] [phone]   Future Orders Complete By Expires     Diet - low sodium heart healthy  As directed     Diet Carb Modified  As directed     Increase activity slowly  As directed       Follow-up Information   Follow up with SERPE, EUGENE, PA-C On 05/29/2012. (See for Dr. Myrtis Ser at 1:40 PM)    Contact information:   [address] [place]  [address] [phone]      BRING ALL MEDICATIONS WITH YOU TO FOLLOW UP APPOINTMENTS  Time spent with patient to include physician time: 31 min Signed: Theodore Demark, PA-C 05/15/2012, 3:23 PM Co-Sign MD

## 2012-05-15 NOTE — Progress Notes (Signed)
Patient ID: Sean Villarreal, male   DOB: [birth year], 72 y.o.   MRN: [id number]   SUBJECTIVE: The patient feels well this morning. I had arranged for his transfer here from [facility]. He's having a relook cath today. In addition his blood pressure is now under better control. He is on carvedilol, lisinopril, and amlodipine. In addition I would like to add some low-dose Imdur. I have not ordered this this morning precath. I would like him to be started on this after cath. I will also talk with the cath team about starting his renal arteries at the time of his catheterization if this is possible.   Filed Vitals:   05/14/12 1726 05/14/12 2100 05/14/12 2200 05/15/12 0500  BP: 140/80 161/79 152/72 147/78  Pulse:  75  78  Temp:  98.3 F (36.8 C)  98 F (36.7 C)  TempSrc:      Resp:  18  18  Height:      Weight:      SpO2:  95%  96%    Intake/Output Summary (Last 24 hours) at 05/15/12 0730 Last data filed at 05/14/12 2346  Gross per 24 hour  Intake    720 ml  Output   1750 ml  Net  -1030 ml    LABS: Basic Metabolic Panel:  Recent Labs  78/29/56 0233 05/15/12 0515  NA 141 138  K 3.1* 3.6  CL 104 100  CO2 28 26  GLUCOSE 131* 279*  BUN 7 9  CREATININE 0.79 0.68  CALCIUM 8.8 9.3   Liver Function Tests: No results found for this basename: AST, ALT, ALKPHOS, BILITOT, PROT, ALBUMIN,  in the last 72 hours No results found for this basename: LIPASE, AMYLASE,  in the last 72 hours CBC:  Recent Labs  05/14/12 0233 05/15/12 0515  WBC 11.0* 11.2*  HGB 12.9* 14.9  HCT 36.1* 41.0  MCV 86.2 85.8  PLT 179 215   Cardiac Enzymes:  Recent Labs  05/13/12 1447 05/13/12 2025 05/14/12 0233  TROPONINI <0.30 <0.30 <0.30   BNP: No components found with this basename: POCBNP,  D-Dimer: No results found for this basename: DDIMER,  in the last 72 hours Hemoglobin A1C: No results found for this basename: HGBA1C,  in the last 72 hours Fasting Lipid Panel: No results found  for this basename: CHOL, HDL, LDLCALC, TRIG, CHOLHDL, LDLDIRECT,  in the last 72 hours Thyroid Function Tests: No results found for this basename: TSH, T4TOTAL, FREET3, T3FREE, THYROIDAB,  in the last 72 hours  RADIOLOGY: No results found.  PHYSICAL EXAM Patient is oriented to person time and place. Affect is normal he is feeling well. Cardiac exam reveals S1 and S2. There no clicks or significant murmurs. There is no peripheral edema.   TELEMETRY:  I reviewed telemetry today May 15, 2012. There is normal sinus rhythm.   ASSESSMENT AND PLAN:    Unstable angina     He is stable. I have spoken with the cath team about him specifically.    Hypertension      His blood pressure has been a significant issue. We have restarted many of his medicines and his pressure is much better today. His renal arteries will be studied in the Cath Lab. I would like for small dose of Imdur were to be added to his medications after the catheterization..    Diabetes mellitus without complication   Hypokalemia   Dyslipidemia   Willa Rough 05/15/2012 7:30 AM

## 2012-05-15 NOTE — CV Procedure (Addendum)
Cardiac Catheterization Operative Report  Sean Villarreal [id number] 4/21/20149:23 AM No PCP Per Patient  Procedure Performed:  1. Left Heart Catheterization 2. Selective Coronary Angiography 3. Left ventricular angiogram 4. Distal aortogram with visualization of both renal arteries 5. Mynx femoral artery closure device right femoral artery  Operator: Verne Carrow, MD  Indication: 72 yo male with history of CAD, DM, HTN, HLD, former tobacco abuse admitted with recurrent chest pain. Complex PCI of ostial Circumflex and mid LAD 05/05/12 with placement of DES in both vessels. Cardiac markers negative.                                      Procedure Details: The risks, benefits, complications, treatment options, and expected outcomes were discussed with the patient. The patient and/or family concurred with the proposed plan, giving informed consent. The patient was brought to the cath lab after IV hydration was begun and oral premedication was given. The patient was further sedated with Versed and Fentanyl. The right groin was prepped and draped in the usual manner. Using the modified Seldinger access technique, a 5 French sheath was placed in the right femoral artery. Standard diagnostic catheters were used to perform selective coronary angiography. A pigtail catheter was used to perform a left ventricular angiogram and a distal aortogram. Mynx femoral artery closure device right femoral artery.   There were no immediate complications. The patient was taken to the recovery area in stable condition.   Hemodynamic Findings: Central aortic pressure: 152/83 Left ventricular pressure: 147/6/0  Angiographic Findings:  Left main: Distal 30% stenosis.   Left Anterior Descending Artery: Large caliber vessel that courses to the apex. The proximal and mid vessel has severe calcification. The proximal vessel has diffuse 30% stenosis. The mid vessel has a patent stent. The mid vessel has  serial 40% stenoses with diffuse calcification. The distal vessel has a focal 60-70% stenosis. The first diagonal branch is very small in caliber (1.5 mm) and has an ostial 80% stenosis. The second diagonal branch is small in caliber and has a 50% ostial stenosis.   Circumflex Artery: Moderate caliber vessel with patent ostial stent. There is moderate calcification of the proximal and mid vessel. The first obtuse marginal branch is moderate in caliber and has 40% ostial stenosis. There is an intermediate branch  Right Coronary Artery: Large dominant vessel with mild luminal irregularities.   Left Ventricular Angiogram: LVEF 50%  Distal Aortogram: Patent bilateral renal arteries with no evidence of obstructive disease.   Impression: 1. Double vessel CAD with patent stents mid LAD and ostial Circumflex.  2. Severe stenosis in the ostium of the small caliber ramus intermediate branch, not favorable for PCI due to vessel size and ostial location of lesion with ostial Circumflex stent. 3. Moderate stenosis distal LAD, does not appear to be flow limiting.  4. Low normal LV systolic function.  5. Patent renal arteries with no evidence of obstructive disease.   Recommendations: He will need continued dual anti-platelet therapy for at least one year. Aggressive risk factor modification. He will be started on Imdur 30 mg po Qdaily. I would suspect that he will have some angina from his disease in the intermediate branch but this vessel is not favorable for PCI. Hopefully we can manage his angina with medications. Discharge home later today. Cancel f/u on Wednesday in [place] office. He will need f/u in 2 weeks with Dr.  Myrtis Ser in [place] office.        Complications:  None. The patient tolerated the procedure well.

## 2012-05-15 NOTE — H&P (View-Only) (Signed)
 Patient Name: Sean Villarreal Date of Encounter: 05/14/2012   Principal Problem:   Unstable angina Active Problems:   Coronary artery disease   Hypertension   Diabetes mellitus without complication   SUBJECTIVE  No chest pain overnight.  CURRENT MEDS . amLODipine  10 mg Oral Daily  . [START ON 05/15/2012] aspirin  324 mg Oral Pre-Cath  . aspirin EC  81 mg Oral Daily  . atorvastatin  80 mg Oral q1800  . [START ON 05/15/2012] diphenhydrAMINE  25 mg Intravenous On Call  . famotidine  20 mg Oral BID  . glimepiride  1 mg Oral Q breakfast  . insulin aspart  0-15 Units Subcutaneous TID WC  . lisinopril  40 mg Oral Daily  . metoprolol succinate  100 mg Oral BID  . nitroGLYCERIN  1 inch Topical Q6H  . potassium chloride  20 mEq Oral Daily  . prasugrel  10 mg Oral Daily  . predniSONE  60 mg Oral Q6H  . [START ON 05/15/2012] predniSONE  60 mg Oral Pre-Cath  . sodium chloride  3 mL Intravenous Q12H   OBJECTIVE  Filed Vitals:   05/13/12 1624 05/13/12 1841 05/13/12 2100 05/14/12 0500  BP: 165/84 164/78 167/88 154/83  Pulse: 79 78 73 68  Temp:   98.1 F (36.7 C) 98.1 F (36.7 C)  TempSrc:      Resp:   20 20  Height:      Weight:      SpO2:   95% 94%    Intake/Output Summary (Last 24 hours) at 05/14/12 0957 Last data filed at 05/14/12 0956  Gross per 24 hour  Intake 1256.88 ml  Output   2050 ml  Net -793.12 ml   Filed Weights   05/13/12 1036  Weight: 226 lb 8 oz (102.74 kg)    PHYSICAL EXAM  General: Pleasant, NAD. Neuro: Alert and oriented X 3. Moves all extremities spontaneously. Psych: Normal affect. HEENT:  Normal  Neck: Supple without bruits or JVD. Lungs:  Resp regular and unlabored, CTA. Heart: RRR no s3, s4, or murmurs. Abdomen: Soft, non-tender, non-distended, BS + x 4.  Extremities: No clubbing, cyanosis or edema. DP/PT/Radials 2+ and equal bilaterally.  Accessory Clinical Findings  CBC  Recent Labs  05/14/12 0233  WBC 11.0*  HGB 12.9*  HCT  36.1*  MCV 86.2  PLT 179   Basic Metabolic Panel  Recent Labs  05/13/12 1130 05/14/12 0233  NA 139 141  K 3.5 3.1*  CL 103 104  CO2 28 28  GLUCOSE 215* 131*  BUN 6 7  CREATININE 0.76 0.79  CALCIUM 8.9 8.8   Cardiac Enzymes  Recent Labs  05/13/12 1447 05/13/12 2025 05/14/12 0233  TROPONINI <0.30 <0.30 <0.30   TELE  rsr  ECG  Rsr, 66, no acute st/t changes.  Radiology/Studies  No results found.  ASSESSMENT AND PLAN  1.  USA/CAD:  No further chest pain on nitropaste and heparin.  For cath tomorrow.  Cont asa, effient, statin, bb, acei, ccb.  Prednisone ordered for dye prophylaxis.  2.  HTN:  Poorly controlled.  Trending in 160's.  He is on high dose lisinopril, norvasc, and bb.  Will switch toprol xl to carvedilol for better antihypertensive effect.  Could add chlorthalidone next.  3.  HL:  Cont statin.  4.  Hypokalemia:  Supp.  5.  DM:  Cot amaryl/ssi.  Signed, Christopher Berge NP   Attending Note:   The patient was seen and examined.  Agree   with assessment and plan as noted above.  Changes made to the above note as needed.  Pt is doing well.  Pain free at present.  For cath tomorrow.    Echo was being done as I examined him this am.  LV function looks normal.  Antar Milks J. Mackinsey Pelland, Jr., MD, FACC 05/14/2012, 10:17 AM   

## 2012-05-16 LAB — GLUCOSE, CAPILLARY: Glucose-Capillary: 149 mg/dL — ABNORMAL HIGH (ref 70–99)

## 2012-05-17 ENCOUNTER — Encounter: Payer: Medicare Other | Admitting: Physician Assistant

## 2012-05-25 DIAGNOSIS — E876 Hypokalemia: Secondary | ICD-10-CM | POA: Diagnosis not present

## 2012-05-29 ENCOUNTER — Ambulatory Visit (INDEPENDENT_AMBULATORY_CARE_PROVIDER_SITE_OTHER): Payer: Medicare Other | Admitting: Physician Assistant

## 2012-05-29 ENCOUNTER — Encounter: Payer: Self-pay | Admitting: Physician Assistant

## 2012-05-29 VITALS — BP 160/82 | HR 79 | Ht 69.0 in | Wt 223.0 lb

## 2012-05-29 DIAGNOSIS — I10 Essential (primary) hypertension: Secondary | ICD-10-CM | POA: Diagnosis not present

## 2012-05-29 DIAGNOSIS — E785 Hyperlipidemia, unspecified: Secondary | ICD-10-CM

## 2012-05-29 DIAGNOSIS — I2510 Atherosclerotic heart disease of native coronary artery without angina pectoris: Secondary | ICD-10-CM | POA: Diagnosis not present

## 2012-05-29 MED ORDER — AMLODIPINE BESYLATE 5 MG PO TABS: 5.0000 mg | ORAL_TABLET | Freq: Every day | ORAL | Status: DC

## 2012-05-29 NOTE — Assessment & Plan Note (Signed)
Continue full dose Lipitor. Aggressive management recommended with target LDL 70 or less, if feasible.

## 2012-05-29 NOTE — Assessment & Plan Note (Addendum)
The results of the second cardiac catheterization were reviewed, notable for continued patency of both the mid LAD and ostial CFX stent sites. Residual disease was noted, and which was not amenable for PCI. His medications were adjusted with the addition of Imdur and substitution of Toprol with carvedilol. He denies any exertional CP, and I suspect that his second presentation with CP here at [facility] was due to uncontrolled BP. I also note that although I started him on amlodipine 5 mg daily prior to his return back to [facility], he was not discharged on this medication. Patient is to remain on DAPT for at least one year.

## 2012-05-29 NOTE — Assessment & Plan Note (Addendum)
Despite recent medication adjustments, patient still needs more aggressive BP control. Therefore, I will place him back on amlodipine 5 mg daily. He is on full dose carvedilol and I would not increase his ACE inhibitor dose. No evidence of RAS, by recent angiography.

## 2012-05-29 NOTE — Patient Instructions (Addendum)
   Begin Norvasc 5mg  daily Continue all other current medications. Establish with [facility] Follow up in  2 months

## 2012-05-29 NOTE — Progress Notes (Signed)
Primary Cardiologist: Jerral Bonito, MD (new)   HPI: Post hospital followup from [facility], recently diagnosed with severe 2V CAD following presentation with NSTEMI earlier last month, successfully treated at [facility] with DES of critical LAD and ostial CFX stenoses; no significant residual RCA disease; EF 50%.  He was then rehospitalized here at [facility] on April 18, 6 days post DC from [facility], with recurrent CP. Troponins nondiagnostic at 0.04 and 0.05. We arranged to have him transferred back to [facility] the following a.m., Saturday, for a relook cardiac catheterization. 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 There was also moderate distal LAD disease, which was not flow limiting; EF 50%. Distal aortogram yielded patent bilateral renal arteries.   Recommendation was to treat medically, and patient was started on Imdur 30 mg daily for possible angina from the RI branch lesion.  Clinically, he reports experiencing some occasional episodes of nonexertional CP during his first week out of the hospital, but none over the last week, or so. Even so, he denies any exertional CP. He feels much better overall, following this second hospitalization. He denies any complications of R groin incision site.  Allergies  Allergen Reactions  . Penicillins     Passed out as a child  . Contrast Media (Iodinated Diagnostic Agents)     Had "welts" on arm, and kidney issues after given dye in the past    Current Outpatient Prescriptions  Medication Sig Dispense Refill  . aspirin 81 MG tablet Take 81 mg by mouth every morning.       [name] atorvastatin (LIPITOR) 80 MG tablet Take 80 mg by mouth at bedtime.      . carvedilol (COREG) 25 MG tablet Take 1 tablet (25 mg total) by mouth 2 (two) times daily with a meal.  60 tablet  11  . glimepiride (AMARYL) 1 MG tablet Take 1 mg by mouth daily before breakfast.      . Hypromellose 0.3 % GEL  Apply 1 application to eye 2 (two) times daily as needed. Medication:Genteal Eye Gel For dry irritated eyes      . isosorbide mononitrate (IMDUR) 30 MG 24 hr tablet Take 1 tablet (30 mg total) by mouth daily.  30 tablet  11  . ketoconazole (NIZORAL) 2 % cream Apply 1 application topically 2 (two) times daily as needed (apply to face as needed (uses during the winter)). For dry skin      . lisinopril (PRINIVIL,ZESTRIL) 40 MG tablet Take 1 tablet (40 mg total) by mouth daily.  30 tablet  3  . metFORMIN (GLUCOPHAGE) 500 MG tablet Take 1 tablet (500 mg total) by mouth daily with breakfast. HOLD for 3 days. RESTART on 05/18/2012.      . nitroGLYCERIN (NITROSTAT) 0.4 MG SL tablet Place 1 tablet (0.4 mg total) under the tongue every 5 (five) minutes x 3 doses as needed for chest pain.  30 tablet  3  . potassium chloride SA (K-DUR,KLOR-CON) 20 MEQ tablet Take 1 tablet (20 mEq total) by mouth daily.  30 tablet  11  . prasugrel (EFFIENT) 10 MG TABS Take 1 tablet (10 mg total) by mouth daily.  30 tablet  11  . amLODipine (NORVASC) 5 MG tablet Take 1 tablet (5 mg total) by mouth daily.  30 tablet  6   No current facility-administered medications for this visit.    Past Medical History  Diagnosis  Date  . Hypertension   . Coronary artery disease   . Diabetes mellitus without complication     No past surgical history on file.  History   Social History  . Marital Status: Married    Spouse Name: N/A    Number of Children: N/A  . Years of Education: N/A   Occupational History  . Not on file.   Social History Main Topics  . Smoking status: Former Smoker    Types: Cigarettes    Start date: 01/25/1982  . Smokeless tobacco: Not on file  . Alcohol Use: No  . Drug Use: No  . Sexually Active: Yes   Other Topics Concern  . Not on file   Social History Narrative  . No narrative on file    No family history on file.  ROS: no nausea, vomiting; no fever, chills; no melena, hematochezia; no  claudication  PHYSICAL EXAM: BP 160/82  Pulse 79  Ht 5\' 9"  (1.753 m)  Wt 223 lb (101.152 kg)  BMI 32.92 kg/m2  SpO2 99% GENERAL: 72 year old male; NAD HEENT: NCAT, PERRLA, EOMI; sclera clear; no xanthelasma NECK: palpable bilateral carotid pulses, no bruits; no JVD; no TM LUNGS: CTA bilaterally CARDIAC: RRR (S1, S2); no significant murmurs; no rubs or gallops ABDOMEN: soft, protuberant EXTREMETIES: no significant peripheral edema SKIN: warm/dry; no obvious rash/lesions MUSCULOSKELETAL: no joint deformity NEURO: no focal deficit; NL affect   EKG:    ASSESSMENT & PLAN:  Coronary artery disease The results of the second cardiac catheterization were reviewed, notable for continued patency of both the mid LAD and ostial CFX stent sites. Residual disease was noted, and which was not amenable for PCI. His medications were adjusted with the addition of Imdur and substitution of Toprol with carvedilol. He denies any exertional CP, and I suspect that his second presentation with CP here at [facility] was due to uncontrolled BP. I also note that although I started him on amlodipine 5 mg daily prior to his return back to [facility], he was not discharged on this medication. Patient is to remain on DAPT for at least one year.   Hypertension Despite recent medication adjustments, patient still needs more aggressive BP control. Therefore, I will place him back on amlodipine 5 mg daily. He is on full dose carvedilol and I would not increase his ACE inhibitor dose. No evidence of RAS, by recent angiography.  Dyslipidemia Continue full dose Lipitor. Aggressive management recommended with target LDL 70 or less, if feasible.    Gene Shelsy Seng, PAC

## 2012-06-05 ENCOUNTER — Telehealth: Payer: Self-pay | Admitting: Physician Assistant

## 2012-06-05 NOTE — Telephone Encounter (Signed)
Sean Villarreal started having CP on Friday. States that he is taking Nitro. States he took BP At 7am 179/100.

## 2012-06-05 NOTE — Telephone Encounter (Signed)
Had chest pain on Friday, 5/9 - did take 2 NTG this day, but was about an hour apart.  Still having chest pain off / on, but lseems to bother more later in the day.  Has only taken NTG x 1 other time on Saturday.  BP this morning 179/100 after first getting up.   169/90 around 9:00 am this morning after morning medications.  Yesterday BP was 168/90.  No c/o dizziness or SOB.  Not due back in office till 8/7 with Dr Myrtis Ser.

## 2012-06-05 NOTE — Telephone Encounter (Signed)
BP control remains the issue. Document compliance and, if so, then increase Norvasc to 10 mg daily.

## 2012-06-06 MED ORDER — AMLODIPINE BESYLATE 5 MG PO TABS: 10.0000 mg | ORAL_TABLET | Freq: Every day | ORAL | Status: DC

## 2012-06-06 NOTE — Telephone Encounter (Signed)
Patient notified & does confirm compliance.  He will take 2 of his 5mg  tablets for now to see how this regimen works.  He will call when needing refill on new dosage.  BP this morning before meds was 182/101  83.  About an hour after meds BP was 171/91  80.  Also, asked pt to continue to monitor readings and let us know if readings not coming down.  Patient verbalized understanding.

## 2012-06-09 DIAGNOSIS — Z880 Allergy status to penicillin: Secondary | ICD-10-CM | POA: Diagnosis not present

## 2012-06-09 DIAGNOSIS — Z87891 Personal history of nicotine dependence: Secondary | ICD-10-CM | POA: Diagnosis not present

## 2012-06-09 DIAGNOSIS — Z8249 Family history of ischemic heart disease and other diseases of the circulatory system: Secondary | ICD-10-CM | POA: Diagnosis not present

## 2012-06-09 DIAGNOSIS — D3500 Benign neoplasm of unspecified adrenal gland: Secondary | ICD-10-CM | POA: Diagnosis not present

## 2012-06-09 DIAGNOSIS — I252 Old myocardial infarction: Secondary | ICD-10-CM | POA: Diagnosis not present

## 2012-06-09 DIAGNOSIS — E785 Hyperlipidemia, unspecified: Secondary | ICD-10-CM | POA: Diagnosis not present

## 2012-06-09 DIAGNOSIS — N23 Unspecified renal colic: Secondary | ICD-10-CM | POA: Diagnosis not present

## 2012-06-09 DIAGNOSIS — R0789 Other chest pain: Secondary | ICD-10-CM | POA: Diagnosis not present

## 2012-06-09 DIAGNOSIS — N201 Calculus of ureter: Secondary | ICD-10-CM | POA: Diagnosis not present

## 2012-06-09 DIAGNOSIS — E119 Type 2 diabetes mellitus without complications: Secondary | ICD-10-CM | POA: Diagnosis not present

## 2012-06-09 DIAGNOSIS — Z79899 Other long term (current) drug therapy: Secondary | ICD-10-CM | POA: Diagnosis not present

## 2012-06-09 DIAGNOSIS — I10 Essential (primary) hypertension: Secondary | ICD-10-CM | POA: Diagnosis not present

## 2012-06-09 DIAGNOSIS — N1330 Unspecified hydronephrosis: Secondary | ICD-10-CM | POA: Diagnosis not present

## 2012-06-09 DIAGNOSIS — D72829 Elevated white blood cell count, unspecified: Secondary | ICD-10-CM | POA: Diagnosis not present

## 2012-06-09 DIAGNOSIS — Z7982 Long term (current) use of aspirin: Secondary | ICD-10-CM | POA: Diagnosis not present

## 2012-06-09 DIAGNOSIS — N200 Calculus of kidney: Secondary | ICD-10-CM | POA: Diagnosis not present

## 2012-06-09 DIAGNOSIS — Z9861 Coronary angioplasty status: Secondary | ICD-10-CM | POA: Diagnosis not present

## 2012-06-09 DIAGNOSIS — Z9689 Presence of other specified functional implants: Secondary | ICD-10-CM | POA: Diagnosis not present

## 2012-06-10 DIAGNOSIS — E119 Type 2 diabetes mellitus without complications: Secondary | ICD-10-CM | POA: Diagnosis not present

## 2012-06-10 DIAGNOSIS — E785 Hyperlipidemia, unspecified: Secondary | ICD-10-CM | POA: Diagnosis not present

## 2012-06-10 DIAGNOSIS — N201 Calculus of ureter: Secondary | ICD-10-CM | POA: Diagnosis not present

## 2012-06-10 DIAGNOSIS — Z79899 Other long term (current) drug therapy: Secondary | ICD-10-CM | POA: Diagnosis not present

## 2012-06-10 DIAGNOSIS — K6389 Other specified diseases of intestine: Secondary | ICD-10-CM | POA: Diagnosis not present

## 2012-06-10 DIAGNOSIS — R0789 Other chest pain: Secondary | ICD-10-CM | POA: Diagnosis not present

## 2012-06-10 DIAGNOSIS — D72829 Elevated white blood cell count, unspecified: Secondary | ICD-10-CM | POA: Diagnosis not present

## 2012-06-10 DIAGNOSIS — I10 Essential (primary) hypertension: Secondary | ICD-10-CM | POA: Diagnosis not present

## 2012-06-10 DIAGNOSIS — Z7982 Long term (current) use of aspirin: Secondary | ICD-10-CM | POA: Diagnosis not present

## 2012-06-10 DIAGNOSIS — N1330 Unspecified hydronephrosis: Secondary | ICD-10-CM | POA: Diagnosis not present

## 2012-06-10 DIAGNOSIS — N23 Unspecified renal colic: Secondary | ICD-10-CM | POA: Diagnosis not present

## 2012-06-13 ENCOUNTER — Other Ambulatory Visit: Payer: Self-pay | Admitting: *Deleted

## 2012-06-13 MED ORDER — AMLODIPINE BESYLATE 10 MG PO TABS: 10.0000 mg | ORAL_TABLET | Freq: Every day | ORAL | Status: DC

## 2012-06-28 DIAGNOSIS — E119 Type 2 diabetes mellitus without complications: Secondary | ICD-10-CM | POA: Diagnosis not present

## 2012-06-28 DIAGNOSIS — I2510 Atherosclerotic heart disease of native coronary artery without angina pectoris: Secondary | ICD-10-CM | POA: Diagnosis not present

## 2012-06-28 DIAGNOSIS — I10 Essential (primary) hypertension: Secondary | ICD-10-CM | POA: Diagnosis not present

## 2012-06-28 DIAGNOSIS — E7800 Pure hypercholesterolemia, unspecified: Secondary | ICD-10-CM | POA: Diagnosis not present

## 2012-06-28 DIAGNOSIS — N200 Calculus of kidney: Secondary | ICD-10-CM | POA: Diagnosis not present

## 2012-06-30 DIAGNOSIS — R319 Hematuria, unspecified: Secondary | ICD-10-CM | POA: Diagnosis not present

## 2012-06-30 DIAGNOSIS — R109 Unspecified abdominal pain: Secondary | ICD-10-CM | POA: Diagnosis not present

## 2012-06-30 DIAGNOSIS — Z9689 Presence of other specified functional implants: Secondary | ICD-10-CM | POA: Diagnosis not present

## 2012-06-30 DIAGNOSIS — N200 Calculus of kidney: Secondary | ICD-10-CM | POA: Diagnosis not present

## 2012-07-31 DIAGNOSIS — I10 Essential (primary) hypertension: Secondary | ICD-10-CM | POA: Diagnosis not present

## 2012-07-31 DIAGNOSIS — I2510 Atherosclerotic heart disease of native coronary artery without angina pectoris: Secondary | ICD-10-CM | POA: Diagnosis not present

## 2012-07-31 DIAGNOSIS — N200 Calculus of kidney: Secondary | ICD-10-CM | POA: Diagnosis not present

## 2012-07-31 DIAGNOSIS — E119 Type 2 diabetes mellitus without complications: Secondary | ICD-10-CM | POA: Diagnosis not present

## 2012-08-25 DIAGNOSIS — I214 Non-ST elevation (NSTEMI) myocardial infarction: Secondary | ICD-10-CM

## 2012-08-25 HISTORY — DX: Non-ST elevation (NSTEMI) myocardial infarction: I21.4

## 2012-08-28 ENCOUNTER — Encounter: Payer: Self-pay | Admitting: Cardiology

## 2012-08-31 ENCOUNTER — Ambulatory Visit (INDEPENDENT_AMBULATORY_CARE_PROVIDER_SITE_OTHER): Payer: Medicare Other | Admitting: Cardiology

## 2012-08-31 ENCOUNTER — Encounter: Payer: Self-pay | Admitting: Cardiology

## 2012-08-31 VITALS — BP 150/90 | HR 77 | Ht 70.0 in | Wt 223.0 lb

## 2012-08-31 DIAGNOSIS — I2510 Atherosclerotic heart disease of native coronary artery without angina pectoris: Secondary | ICD-10-CM

## 2012-08-31 DIAGNOSIS — I10 Essential (primary) hypertension: Secondary | ICD-10-CM | POA: Diagnosis not present

## 2012-08-31 DIAGNOSIS — N200 Calculus of kidney: Secondary | ICD-10-CM | POA: Insufficient documentation

## 2012-08-31 NOTE — Patient Instructions (Addendum)
Your physician recommends that you schedule a follow-up appointment in: 3 months. Your physician recommends that you continue on your current medications as directed. Please refer to the Current Medication list given to you today. 

## 2012-08-31 NOTE — Assessment & Plan Note (Signed)
Unfortunately his situation is complicated by a large renal stone. There is a stent in place. At this point he cannot come off his dual antiplatelet therapy for definitive treatment of the stone.  As part of today's evaluation I spent greater than 25 minutes with the patient's total care. More than half of this was spent with extensive discussions about his coronary disease and the problems that he is having with his renal stone.

## 2012-08-31 NOTE — Assessment & Plan Note (Signed)
Blood pressures control. No change in therapy. 

## 2012-08-31 NOTE — Assessment & Plan Note (Signed)
The patient has significant coronary disease that is stable at this time. I had a very complete discussion with him about this. I explain why he might have some rare intermittent angina. He understands the importance of dual antiplatelet therapy for one year.

## 2012-08-31 NOTE — Progress Notes (Signed)
HPI  Patient is seen to followup coronary artery disease. I saw this patient in the past in the emergency room. This is the first time I have seen him in the office. He has been seen by Mr. Serpe in the office. As part of today's evaluation I have reviewed all of the prior records including the hospital records and the recent office notes.   the patient had a non-STEMI in April, 2014. He received drug-eluting stent of severe LAD and circumflex lesions. After that he had some recurrent symptoms and he had a followup cath. The stents were patent. He has severe ostial stenosis of a small ramus intermedius. It is felt that he may have some symptoms from this from time to time. He has taken one nitroglycerin since his last visit in May.  He has had significant problems with kidney stones. He has a stone that is too big to pass. He cannot be crushed at this time due to his need for dual antiplatelet therapy. There is a stent in place. It will probably need to be replaced in September, 2014. He must remain on dual antiplatelet therapy for one year unless he has a true emergency. He understands this.  Allergies  Allergen Reactions  . Penicillins     Passed out as a child  . Contrast Media (Iodinated Diagnostic Agents)     Had "welts" on arm, and kidney issues after given dye in the past    Current Outpatient Prescriptions  Medication Sig Dispense Refill  . amLODipine (NORVASC) 10 MG tablet Take 1 tablet (10 mg total) by mouth daily.  30 tablet  3  . aspirin 81 MG tablet Take 81 mg by mouth every morning.       [name] atorvastatin (LIPITOR) 80 MG tablet Take 80 mg by mouth at bedtime.      . carvedilol (COREG) 25 MG tablet Take 1 tablet (25 mg total) by mouth 2 (two) times daily with a meal.  60 tablet  11  . glimepiride (AMARYL) 1 MG tablet Take 1 mg by mouth daily before breakfast.      . HYDROcodone-acetaminophen (NORCO/VICODIN) 5-325 MG per tablet Take 1-2 tablets by mouth every 4 (four) hours as  needed for pain.      . Hypromellose 0.3 % GEL Apply 1 application to eye 2 (two) times daily as needed. Medication:Genteal Eye Gel For dry irritated eyes      . isosorbide mononitrate (IMDUR) 30 MG 24 hr tablet Take 1 tablet (30 mg total) by mouth daily.  30 tablet  11  . ketoconazole (NIZORAL) 2 % cream Apply 1 application topically 2 (two) times daily as needed (apply to face as needed (uses during the winter)). For dry skin      . lisinopril (PRINIVIL,ZESTRIL) 40 MG tablet Take 1 tablet (40 mg total) by mouth daily.  30 tablet  3  . metFORMIN (GLUCOPHAGE) 1000 MG tablet Take 1,500 mg by mouth daily with breakfast.      . nitroGLYCERIN (NITROSTAT) 0.4 MG SL tablet Place 1 tablet (0.4 mg total) under the tongue every 5 (five) minutes x 3 doses as needed for chest pain.  30 tablet  3  . potassium chloride SA (K-DUR,KLOR-CON) 20 MEQ tablet Take 1 tablet (20 mEq total) by mouth daily.  30 tablet  11  . prasugrel (EFFIENT) 10 MG TABS Take 1 tablet (10 mg total) by mouth daily.  30 tablet  11  . tamsulosin (FLOMAX) 0.4 MG CAPS capsule  Take by mouth.       No current facility-administered medications for this visit.    History   Social History  . Marital Status: Married    Spouse Name: N/A    Number of Children: N/A  . Years of Education: N/A   Occupational History  . Not on file.   Social History Main Topics  . Smoking status: Former Smoker    Types: Cigarettes    Start date: 01/25/1982  . Smokeless tobacco: Not on file  . Alcohol Use: No  . Drug Use: No  . Sexually Active: Yes   Other Topics Concern  . Not on file   Social History Narrative  . No narrative on file    No family history on file.  Past Medical History  Diagnosis Date  . Hypertension   . Coronary artery disease   . Diabetes mellitus without complication   . Dyslipidemia     History reviewed. No pertinent past surgical history.  Patient Active Problem List   Diagnosis Date Noted  . Hypokalemia  05/14/2012  . Dyslipidemia 05/14/2012  . Hypertension   . Coronary artery disease 05/06/2012  . DM (diabetes mellitus) 05/06/2012    ROS   Patient denies fever, chills, headache, sweats, rash, change in vision, change in hearing, cough, nausea vomiting, urinary symptoms. All other systems are reviewed and are negative.  PHYSICAL EXAM  Patient is oriented to person time and place. Affect is normal. There is no jugulovenous distention. Lungs are clear. Respiratory effort is nonlabored. Cardiac exam reveals S1 and S2. There no clicks or significant murmurs. The abdomen is soft. Is no peripheral edema. There no musculoskeletal deformities. There are no skin rashes.  Filed Vitals:   08/31/12 1032  BP: 150/90  Pulse: 77  Height: 5\' 10"  (1.778 m)  Weight: 223 lb (101.152 kg)  SpO2: 98%     ASSESSMENT & PLAN

## 2012-09-18 DIAGNOSIS — L821 Other seborrheic keratosis: Secondary | ICD-10-CM | POA: Diagnosis not present

## 2012-09-18 DIAGNOSIS — D239 Other benign neoplasm of skin, unspecified: Secondary | ICD-10-CM | POA: Diagnosis not present

## 2012-09-18 DIAGNOSIS — L819 Disorder of pigmentation, unspecified: Secondary | ICD-10-CM | POA: Diagnosis not present

## 2012-09-18 DIAGNOSIS — L570 Actinic keratosis: Secondary | ICD-10-CM | POA: Diagnosis not present

## 2012-09-18 DIAGNOSIS — Z85828 Personal history of other malignant neoplasm of skin: Secondary | ICD-10-CM | POA: Diagnosis not present

## 2012-09-18 DIAGNOSIS — D492 Neoplasm of unspecified behavior of bone, soft tissue, and skin: Secondary | ICD-10-CM | POA: Diagnosis not present

## 2012-09-25 DIAGNOSIS — Z9289 Personal history of other medical treatment: Secondary | ICD-10-CM

## 2012-09-25 HISTORY — DX: Personal history of other medical treatment: Z92.89

## 2012-10-10 DIAGNOSIS — L570 Actinic keratosis: Secondary | ICD-10-CM | POA: Diagnosis not present

## 2012-10-11 DIAGNOSIS — R109 Unspecified abdominal pain: Secondary | ICD-10-CM | POA: Diagnosis not present

## 2012-10-11 DIAGNOSIS — R319 Hematuria, unspecified: Secondary | ICD-10-CM | POA: Diagnosis not present

## 2012-10-11 DIAGNOSIS — N200 Calculus of kidney: Secondary | ICD-10-CM | POA: Diagnosis not present

## 2012-10-11 DIAGNOSIS — N3289 Other specified disorders of bladder: Secondary | ICD-10-CM | POA: Diagnosis not present

## 2012-10-12 DIAGNOSIS — N3289 Other specified disorders of bladder: Secondary | ICD-10-CM | POA: Diagnosis not present

## 2012-10-12 DIAGNOSIS — R319 Hematuria, unspecified: Secondary | ICD-10-CM | POA: Diagnosis not present

## 2012-10-18 DIAGNOSIS — Z7902 Long term (current) use of antithrombotics/antiplatelets: Secondary | ICD-10-CM | POA: Diagnosis not present

## 2012-10-18 DIAGNOSIS — N200 Calculus of kidney: Secondary | ICD-10-CM | POA: Diagnosis not present

## 2012-10-18 DIAGNOSIS — I10 Essential (primary) hypertension: Secondary | ICD-10-CM | POA: Diagnosis not present

## 2012-10-18 DIAGNOSIS — Z87891 Personal history of nicotine dependence: Secondary | ICD-10-CM | POA: Diagnosis not present

## 2012-10-18 DIAGNOSIS — K5730 Diverticulosis of large intestine without perforation or abscess without bleeding: Secondary | ICD-10-CM | POA: Diagnosis not present

## 2012-10-18 DIAGNOSIS — Z9861 Coronary angioplasty status: Secondary | ICD-10-CM | POA: Diagnosis not present

## 2012-10-18 DIAGNOSIS — N201 Calculus of ureter: Secondary | ICD-10-CM | POA: Diagnosis not present

## 2012-10-18 DIAGNOSIS — E785 Hyperlipidemia, unspecified: Secondary | ICD-10-CM | POA: Diagnosis not present

## 2012-10-18 DIAGNOSIS — IMO0002 Reserved for concepts with insufficient information to code with codable children: Secondary | ICD-10-CM | POA: Diagnosis not present

## 2012-10-18 DIAGNOSIS — Z7982 Long term (current) use of aspirin: Secondary | ICD-10-CM | POA: Diagnosis not present

## 2012-10-18 DIAGNOSIS — N3289 Other specified disorders of bladder: Secondary | ICD-10-CM | POA: Diagnosis not present

## 2012-10-18 DIAGNOSIS — N1330 Unspecified hydronephrosis: Secondary | ICD-10-CM | POA: Diagnosis not present

## 2012-10-18 DIAGNOSIS — Z6831 Body mass index (BMI) 31.0-31.9, adult: Secondary | ICD-10-CM | POA: Diagnosis not present

## 2012-10-18 DIAGNOSIS — Z880 Allergy status to penicillin: Secondary | ICD-10-CM | POA: Diagnosis not present

## 2012-10-18 DIAGNOSIS — I2510 Atherosclerotic heart disease of native coronary artery without angina pectoris: Secondary | ICD-10-CM | POA: Diagnosis not present

## 2012-10-18 DIAGNOSIS — Z9689 Presence of other specified functional implants: Secondary | ICD-10-CM | POA: Diagnosis not present

## 2012-10-18 DIAGNOSIS — Z86718 Personal history of other venous thrombosis and embolism: Secondary | ICD-10-CM | POA: Diagnosis not present

## 2012-10-18 DIAGNOSIS — E669 Obesity, unspecified: Secondary | ICD-10-CM | POA: Diagnosis not present

## 2012-10-18 DIAGNOSIS — R319 Hematuria, unspecified: Secondary | ICD-10-CM | POA: Diagnosis not present

## 2012-10-18 DIAGNOSIS — Z87442 Personal history of urinary calculi: Secondary | ICD-10-CM | POA: Diagnosis not present

## 2012-10-18 DIAGNOSIS — E119 Type 2 diabetes mellitus without complications: Secondary | ICD-10-CM | POA: Diagnosis not present

## 2012-10-18 DIAGNOSIS — R109 Unspecified abdominal pain: Secondary | ICD-10-CM | POA: Diagnosis not present

## 2012-10-18 DIAGNOSIS — Z79899 Other long term (current) drug therapy: Secondary | ICD-10-CM | POA: Diagnosis not present

## 2012-10-19 DIAGNOSIS — R319 Hematuria, unspecified: Secondary | ICD-10-CM | POA: Diagnosis not present

## 2012-10-19 DIAGNOSIS — N3289 Other specified disorders of bladder: Secondary | ICD-10-CM | POA: Diagnosis not present

## 2012-10-19 DIAGNOSIS — Z9689 Presence of other specified functional implants: Secondary | ICD-10-CM | POA: Diagnosis not present

## 2012-10-19 DIAGNOSIS — Z452 Encounter for adjustment and management of vascular access device: Secondary | ICD-10-CM | POA: Diagnosis not present

## 2012-10-19 DIAGNOSIS — Z79899 Other long term (current) drug therapy: Secondary | ICD-10-CM | POA: Diagnosis not present

## 2012-10-19 DIAGNOSIS — N201 Calculus of ureter: Secondary | ICD-10-CM | POA: Diagnosis not present

## 2012-10-19 DIAGNOSIS — Z466 Encounter for fitting and adjustment of urinary device: Secondary | ICD-10-CM | POA: Diagnosis not present

## 2012-10-19 DIAGNOSIS — N200 Calculus of kidney: Secondary | ICD-10-CM | POA: Diagnosis not present

## 2012-10-19 DIAGNOSIS — E119 Type 2 diabetes mellitus without complications: Secondary | ICD-10-CM | POA: Diagnosis not present

## 2012-10-19 DIAGNOSIS — R109 Unspecified abdominal pain: Secondary | ICD-10-CM | POA: Diagnosis not present

## 2012-10-21 ENCOUNTER — Encounter: Payer: Self-pay | Admitting: Cardiology

## 2012-10-21 DIAGNOSIS — K5730 Diverticulosis of large intestine without perforation or abscess without bleeding: Secondary | ICD-10-CM | POA: Diagnosis not present

## 2012-10-21 DIAGNOSIS — R338 Other retention of urine: Secondary | ICD-10-CM | POA: Diagnosis not present

## 2012-10-21 DIAGNOSIS — R319 Hematuria, unspecified: Secondary | ICD-10-CM | POA: Diagnosis not present

## 2012-10-21 DIAGNOSIS — R310 Gross hematuria: Secondary | ICD-10-CM | POA: Diagnosis not present

## 2012-10-21 DIAGNOSIS — N1330 Unspecified hydronephrosis: Secondary | ICD-10-CM | POA: Diagnosis not present

## 2012-10-22 DIAGNOSIS — R339 Retention of urine, unspecified: Secondary | ICD-10-CM | POA: Diagnosis not present

## 2012-10-22 DIAGNOSIS — R310 Gross hematuria: Secondary | ICD-10-CM | POA: Diagnosis not present

## 2012-10-22 DIAGNOSIS — R338 Other retention of urine: Secondary | ICD-10-CM | POA: Diagnosis not present

## 2012-10-23 DIAGNOSIS — R319 Hematuria, unspecified: Secondary | ICD-10-CM | POA: Diagnosis not present

## 2012-10-23 DIAGNOSIS — R338 Other retention of urine: Secondary | ICD-10-CM | POA: Diagnosis not present

## 2012-10-23 DIAGNOSIS — R310 Gross hematuria: Secondary | ICD-10-CM | POA: Diagnosis not present

## 2012-10-23 DIAGNOSIS — R339 Retention of urine, unspecified: Secondary | ICD-10-CM | POA: Diagnosis not present

## 2012-10-25 ENCOUNTER — Encounter: Payer: Self-pay | Admitting: Cardiology

## 2012-10-25 DIAGNOSIS — D689 Coagulation defect, unspecified: Secondary | ICD-10-CM | POA: Diagnosis not present

## 2012-10-25 DIAGNOSIS — Z7902 Long term (current) use of antithrombotics/antiplatelets: Secondary | ICD-10-CM | POA: Diagnosis not present

## 2012-10-25 DIAGNOSIS — Z7982 Long term (current) use of aspirin: Secondary | ICD-10-CM | POA: Diagnosis not present

## 2012-10-25 DIAGNOSIS — Z880 Allergy status to penicillin: Secondary | ICD-10-CM | POA: Diagnosis not present

## 2012-10-25 DIAGNOSIS — T8389XA Other specified complication of genitourinary prosthetic devices, implants and grafts, initial encounter: Secondary | ICD-10-CM | POA: Diagnosis not present

## 2012-10-25 DIAGNOSIS — Z91041 Radiographic dye allergy status: Secondary | ICD-10-CM | POA: Diagnosis not present

## 2012-10-25 DIAGNOSIS — D62 Acute posthemorrhagic anemia: Secondary | ICD-10-CM | POA: Diagnosis not present

## 2012-10-25 DIAGNOSIS — R310 Gross hematuria: Secondary | ICD-10-CM | POA: Diagnosis present

## 2012-10-25 DIAGNOSIS — Z9861 Coronary angioplasty status: Secondary | ICD-10-CM | POA: Diagnosis not present

## 2012-10-25 DIAGNOSIS — I2510 Atherosclerotic heart disease of native coronary artery without angina pectoris: Secondary | ICD-10-CM | POA: Diagnosis not present

## 2012-10-25 DIAGNOSIS — I252 Old myocardial infarction: Secondary | ICD-10-CM | POA: Diagnosis not present

## 2012-10-25 DIAGNOSIS — I519 Heart disease, unspecified: Secondary | ICD-10-CM | POA: Diagnosis present

## 2012-10-25 DIAGNOSIS — R339 Retention of urine, unspecified: Secondary | ICD-10-CM | POA: Diagnosis present

## 2012-10-25 DIAGNOSIS — R319 Hematuria, unspecified: Secondary | ICD-10-CM | POA: Diagnosis not present

## 2012-10-25 DIAGNOSIS — Z79899 Other long term (current) drug therapy: Secondary | ICD-10-CM | POA: Diagnosis not present

## 2012-10-25 DIAGNOSIS — E119 Type 2 diabetes mellitus without complications: Secondary | ICD-10-CM | POA: Diagnosis present

## 2012-10-25 HISTORY — PX: CYSTOSCOPY W/ URETEROSCOPY W/ LITHOTRIPSY: SUR380

## 2012-10-25 NOTE — Progress Notes (Signed)
   The patient is currently in the emergency room at [facility]. I spoke with the emergency room physician. The patient unfortunately has had significant hematuria after his ureteral stent was changed recently. He has been on aspirin and Effient since April, 2014. He had a stent at that time. His hematuria is quite severe causing clots. He is requiring urinary irrigation. He has been on dual antiplatelet therapy for 6 months. He has a new generation stent. I feel it is most prudent for him to come off Effient at this time.  I made it clear that the emergency room physician showed have the patient stop the patient's Effient and that we would arrange to see the patient in the office soon to monitor his meds.  Jerral Bonito, MD

## 2012-10-31 ENCOUNTER — Encounter: Payer: Self-pay | Admitting: Cardiology

## 2012-10-31 DIAGNOSIS — R319 Hematuria, unspecified: Secondary | ICD-10-CM | POA: Insufficient documentation

## 2012-11-02 ENCOUNTER — Encounter: Payer: Self-pay | Admitting: Cardiology

## 2012-11-02 ENCOUNTER — Ambulatory Visit (INDEPENDENT_AMBULATORY_CARE_PROVIDER_SITE_OTHER): Payer: Medicare Other | Admitting: Cardiology

## 2012-11-02 VITALS — BP 105/64 | HR 98 | Ht 70.0 in | Wt 218.8 lb

## 2012-11-02 DIAGNOSIS — I2510 Atherosclerotic heart disease of native coronary artery without angina pectoris: Secondary | ICD-10-CM

## 2012-11-02 DIAGNOSIS — R319 Hematuria, unspecified: Secondary | ICD-10-CM | POA: Diagnosis not present

## 2012-11-02 DIAGNOSIS — R0989 Other specified symptoms and signs involving the circulatory and respiratory systems: Secondary | ICD-10-CM | POA: Diagnosis not present

## 2012-11-02 DIAGNOSIS — R0602 Shortness of breath: Secondary | ICD-10-CM | POA: Insufficient documentation

## 2012-11-02 DIAGNOSIS — Z01810 Encounter for preprocedural cardiovascular examination: Secondary | ICD-10-CM

## 2012-11-02 DIAGNOSIS — R609 Edema, unspecified: Secondary | ICD-10-CM | POA: Diagnosis not present

## 2012-11-02 DIAGNOSIS — N200 Calculus of kidney: Secondary | ICD-10-CM

## 2012-11-02 DIAGNOSIS — R9430 Abnormal result of cardiovascular function study, unspecified: Secondary | ICD-10-CM

## 2012-11-02 DIAGNOSIS — IMO0002 Reserved for concepts with insufficient information to code with codable children: Secondary | ICD-10-CM | POA: Insufficient documentation

## 2012-11-02 MED ORDER — FUROSEMIDE 20 MG PO TABS: 20.0000 mg | ORAL_TABLET | Freq: Every day | ORAL | Status: DC

## 2012-11-02 NOTE — Assessment & Plan Note (Signed)
I have carefully considered all aspects of the patient's cardiac medications. I feel it is most prudent to proceed with his lithotripsy on October 16. He will remain off Effient.  He will remain on aspirin.  I will see him back in the office on the following day for further planning.  As part of today's evaluation I spent greater than 25 minutes for this total care. More than half of 25 minutes has been spent with direct contact with the patient and his wife discussing all aspects of his care.

## 2012-11-02 NOTE — Assessment & Plan Note (Signed)
The patient's cardiac status is stable today. We had to put his Effient on hold. I am clearing him to have lithotripsy next week. He is to remain on his aspirin. I will see him in the office the day after this procedure. We will decide then if we will restart a second antiplatelet agent. I explained all of the rationale for the approach that I'm taking to the patient and his wife.

## 2012-11-02 NOTE — Patient Instructions (Signed)
Your physician recommends that you schedule a follow-up appointment in: Friday, November 10, 2012 at 4:00 pm with Dr. Myrtis Ser. Your physician has recommended you make the following change in your medication: Please take furosemide 20 mg daily for 3 days only then Stop it. All other medications will remain the same. You are cleared to have your procedure. You must remain on your aspirin. Dr. Nechama Guard will be contacted.

## 2012-11-02 NOTE — Progress Notes (Signed)
HPI   The patient is seen today to help with the management of his coronary disease. He was recently hospitalized.  He had to have his ureteral stent changed. He went home and came back with significant bleeding and he was readmitted on October 25, 2012.[name] At that time I spoke with the emergency room physician and the hospitalist about his dual antiplatelet therapy. I reviewed his records that show that his stent was placed in April, 2014. Fortunately this was 6 months ago. With this in mind, I felt that Effient could be held. He does have a new generation stent. He had significant hematuria with need for transfusion. Finally he stabilize.  As part of today's evaluation I have reviewed the hospital records.  Today the patient is stable. He still feels weak from the events. He is now been off Effient for 9 days. He has not had any cardiac symptoms. He does mention that he is had some mild edema during the day that appears to go away at nighttime. However yesterday he had some shortness of breath. He is drinking moderate fluids to help with his urine flow. Historically his ejection fraction was normal at the time of his cardiac events in April, 2014. His ejection fraction has not been checked since then.  He is tentatively scheduled to have lithotripsy on October 16. We discussed all aspects of this as it relates to his antiplatelet therapy.  Allergies  Allergen Reactions  . Penicillins     Passed out as a child  . Contrast Media [Iodinated Diagnostic Agents]     Had "welts" on arm, and kidney issues after given dye in the past    Current Outpatient Prescriptions  Medication Sig Dispense Refill  . aspirin 81 MG tablet Take 81 mg by mouth every morning.       [name] atorvastatin (LIPITOR) 80 MG tablet Take 80 mg by mouth at bedtime.      . carvedilol (COREG) 25 MG tablet Take 1 tablet (25 mg total) by mouth 2 (two) times daily with a meal.  60 tablet  11  . glimepiride (AMARYL) 1 MG tablet Take 1 mg  by mouth daily before breakfast.      . HYDROcodone-acetaminophen (NORCO/VICODIN) 5-325 MG per tablet Take 1-2 tablets by mouth every 4 (four) hours as needed for pain.      . isosorbide mononitrate (IMDUR) 30 MG 24 hr tablet Take 1 tablet (30 mg total) by mouth daily.  30 tablet  11  . ketoconazole (NIZORAL) 2 % cream Apply 1 application topically 2 (two) times daily as needed (apply to face as needed (uses during the winter)). For dry skin      . lisinopril (PRINIVIL,ZESTRIL) 40 MG tablet Take 1 tablet (40 mg total) by mouth daily.  30 tablet  3  . metFORMIN (GLUCOPHAGE) 1000 MG tablet Take 1/2 tab (500mg ) by mouth every morning & 1 tab (1000mg ) by mouth every evening      . nitrofurantoin (MACRODANTIN) 100 MG capsule Take 100 mg by mouth 2 (two) times daily. Has 3 more days left.      . potassium chloride SA (K-DUR,KLOR-CON) 20 MEQ tablet Take 1 tablet (20 mEq total) by mouth daily.  30 tablet  11  . tamsulosin (FLOMAX) 0.4 MG CAPS capsule Take 0.4 mg by mouth at bedtime.       [name] amLODipine (NORVASC) 10 MG tablet Take 1 tablet (10 mg total) by mouth daily.  30 tablet  3  .  furosemide (LASIX) 20 MG tablet Take 1 tablet (20 mg total) by mouth daily.  30 tablet  0  . nitroGLYCERIN (NITROSTAT) 0.4 MG SL tablet Place 1 tablet (0.4 mg total) under the tongue every 5 (five) minutes x 3 doses as needed for chest pain.  30 tablet  3   No current facility-administered medications for this visit.    History   Social History  . Marital Status: Married    Spouse Name: N/A    Number of Children: N/A  . Years of Education: N/A   Occupational History  . Not on file.   Social History Main Topics  . Smoking status: Former Smoker    Types: Cigarettes    Start date: 01/25/1982  . Smokeless tobacco: Not on file  . Alcohol Use: No  . Drug Use: No  . Sexual Activity: Yes   Other Topics Concern  . Not on file   Social History Narrative  . No narrative on file    No family history on  file.  Past Medical History  Diagnosis Date  . Hypertension   . Coronary artery disease   . Diabetes mellitus without complication   . Dyslipidemia   . Nephrolithiasis   . Hematuria   . Ejection fraction     History reviewed. No pertinent past surgical history.  Patient Active Problem List   Diagnosis Date Noted  . Ejection fraction   . Hematuria   . Nephrolithiasis   . Hypokalemia 05/14/2012  . Dyslipidemia 05/14/2012  . Hypertension   . Coronary artery disease 05/06/2012  . DM (diabetes mellitus) 05/06/2012    ROS   Patient denies fever, chills, headache, sweats, rash, change in vision, change in hearing, chest pain, cough, nausea vomiting,. All other systems are reviewed and are negative.  PHYSICAL EXAM  Patient is here with his wife. He is fatigued from all of his recent events but stable. There is no jugulovenous distention. Lungs reveal question of a few scattered rales. Cardiac exam reveals S1 and S2. The abdomen is soft. There is no significant edema at this time. There no musculoskeletal deformities. There are no skin rashes.  Filed Vitals:   11/02/12 0959  BP: 105/64  Pulse: 98  Height: 5\' 10"  (1.778 m)  Weight: 218 lb 12.8 oz (99.247 kg)  SpO2: 97%     ASSESSMENT & PLAN

## 2012-11-02 NOTE — Assessment & Plan Note (Addendum)
Patient had his first mild shortness of breath yesterday. Historically he has had good left ventricular function. He may have slight excess fluid. I am giving him a small dose of Lasix for 3 days.

## 2012-11-02 NOTE — Assessment & Plan Note (Signed)
This is a new complaint for the patient. I decided to have him take a small dose of Lasix for 3 days.

## 2012-11-03 DIAGNOSIS — Z9689 Presence of other specified functional implants: Secondary | ICD-10-CM | POA: Diagnosis not present

## 2012-11-03 DIAGNOSIS — N201 Calculus of ureter: Secondary | ICD-10-CM | POA: Diagnosis not present

## 2012-11-03 DIAGNOSIS — R319 Hematuria, unspecified: Secondary | ICD-10-CM | POA: Diagnosis not present

## 2012-11-09 DIAGNOSIS — N201 Calculus of ureter: Secondary | ICD-10-CM | POA: Diagnosis not present

## 2012-11-09 DIAGNOSIS — R319 Hematuria, unspecified: Secondary | ICD-10-CM | POA: Diagnosis not present

## 2012-11-09 DIAGNOSIS — E119 Type 2 diabetes mellitus without complications: Secondary | ICD-10-CM | POA: Diagnosis not present

## 2012-11-09 DIAGNOSIS — E785 Hyperlipidemia, unspecified: Secondary | ICD-10-CM | POA: Diagnosis not present

## 2012-11-09 DIAGNOSIS — Z91041 Radiographic dye allergy status: Secondary | ICD-10-CM | POA: Diagnosis not present

## 2012-11-09 DIAGNOSIS — Z6831 Body mass index (BMI) 31.0-31.9, adult: Secondary | ICD-10-CM | POA: Diagnosis not present

## 2012-11-09 DIAGNOSIS — Z9861 Coronary angioplasty status: Secondary | ICD-10-CM | POA: Diagnosis not present

## 2012-11-09 DIAGNOSIS — E669 Obesity, unspecified: Secondary | ICD-10-CM | POA: Diagnosis not present

## 2012-11-09 DIAGNOSIS — I10 Essential (primary) hypertension: Secondary | ICD-10-CM | POA: Diagnosis not present

## 2012-11-09 DIAGNOSIS — Z9689 Presence of other specified functional implants: Secondary | ICD-10-CM | POA: Diagnosis not present

## 2012-11-09 DIAGNOSIS — Z87442 Personal history of urinary calculi: Secondary | ICD-10-CM | POA: Diagnosis not present

## 2012-11-09 DIAGNOSIS — Z880 Allergy status to penicillin: Secondary | ICD-10-CM | POA: Diagnosis not present

## 2012-11-09 DIAGNOSIS — Z87891 Personal history of nicotine dependence: Secondary | ICD-10-CM | POA: Diagnosis not present

## 2012-11-09 DIAGNOSIS — I2510 Atherosclerotic heart disease of native coronary artery without angina pectoris: Secondary | ICD-10-CM | POA: Diagnosis not present

## 2012-11-09 DIAGNOSIS — Z79899 Other long term (current) drug therapy: Secondary | ICD-10-CM | POA: Diagnosis not present

## 2012-11-09 DIAGNOSIS — Z7982 Long term (current) use of aspirin: Secondary | ICD-10-CM | POA: Diagnosis not present

## 2012-11-10 ENCOUNTER — Encounter: Payer: Self-pay | Admitting: Cardiology

## 2012-11-10 ENCOUNTER — Ambulatory Visit (INDEPENDENT_AMBULATORY_CARE_PROVIDER_SITE_OTHER): Payer: Medicare Other | Admitting: Cardiology

## 2012-11-10 VITALS — BP 148/85 | HR 91 | Ht 70.0 in | Wt 217.1 lb

## 2012-11-10 DIAGNOSIS — I10 Essential (primary) hypertension: Secondary | ICD-10-CM | POA: Diagnosis not present

## 2012-11-10 DIAGNOSIS — I2510 Atherosclerotic heart disease of native coronary artery without angina pectoris: Secondary | ICD-10-CM

## 2012-11-10 DIAGNOSIS — R319 Hematuria, unspecified: Secondary | ICD-10-CM

## 2012-11-10 MED ORDER — CLOPIDOGREL BISULFATE 75 MG PO TABS: 75.0000 mg | ORAL_TABLET | Freq: Every day | ORAL | Status: DC

## 2012-11-10 NOTE — Progress Notes (Signed)
Patient ID: Sean Villarreal, male   DOB: 1940-09-23, 72 y.o.   MRN: [id number]    HPI  Patient is seen back to followup his coronary disease and recent significant hematuria. I saw him November 02, 2012. At that time we had a full and complete discussion about the fact that he was holding Effient. He had lithotripsy yesterday. He has not had significant hematuria. He did pass a small stone particle last night. He's doing better. He's not had any chest pain.  Allergies  Allergen Reactions  . Penicillins     Passed out as a child  . Contrast Media [Iodinated Diagnostic Agents]     Had "welts" on arm, and kidney issues after given dye in the past    Current Outpatient Prescriptions  Medication Sig Dispense Refill  . amLODipine (NORVASC) 10 MG tablet Take 1 tablet (10 mg total) by mouth daily.  30 tablet  3  . aspirin 81 MG tablet Take 81 mg by mouth every morning.       [name] atorvastatin (LIPITOR) 80 MG tablet Take 80 mg by mouth at bedtime.      . carvedilol (COREG) 25 MG tablet Take 1 tablet (25 mg total) by mouth 2 (two) times daily with a meal.  60 tablet  11  . ciprofloxacin (CIPRO) 500 MG tablet Take 500 mg by mouth 2 (two) times daily.       . furosemide (LASIX) 20 MG tablet Take 20 mg by mouth as needed.      [name] glimepiride (AMARYL) 1 MG tablet Take 1 mg by mouth daily before breakfast.      . HYDROcodone-acetaminophen (NORCO/VICODIN) 5-325 MG per tablet Take 1-2 tablets by mouth every 4 (four) hours as needed for pain.      . isosorbide mononitrate (IMDUR) 30 MG 24 hr tablet Take 1 tablet (30 mg total) by mouth daily.  30 tablet  11  . ketoconazole (NIZORAL) 2 % cream Apply 1 application topically 2 (two) times daily as needed (apply to face as needed (uses during the winter)). For dry skin      . lisinopril (PRINIVIL,ZESTRIL) 40 MG tablet Take 1 tablet (40 mg total) by mouth daily.  30 tablet  3  . metFORMIN (GLUCOPHAGE) 1000 MG tablet Take 1/2 tab (500mg ) by mouth every morning & 1 tab  (1000mg ) by mouth every evening      . nitroGLYCERIN (NITROSTAT) 0.4 MG SL tablet Place 1 tablet (0.4 mg total) under the tongue every 5 (five) minutes x 3 doses as needed for chest pain.  30 tablet  3  . potassium chloride SA (K-DUR,KLOR-CON) 20 MEQ tablet Take 1 tablet (20 mEq total) by mouth daily.  30 tablet  11  . tamsulosin (FLOMAX) 0.4 MG CAPS capsule Take 0.4 mg by mouth at bedtime.        No current facility-administered medications for this visit.    History   Social History  . Marital Status: Married    Spouse Name: N/A    Number of Children: N/A  . Years of Education: N/A   Occupational History  . Not on file.   Social History Main Topics  . Smoking status: Former Smoker    Types: Cigarettes    Start date: 01/25/1982  . Smokeless tobacco: Not on file  . Alcohol Use: No  . Drug Use: No  . Sexual Activity: Yes   Other Topics Concern  . Not on file   Social History Narrative  .  No narrative on file    No family history on file.  Past Medical History  Diagnosis Date  . Hypertension   . Coronary artery disease   . Diabetes mellitus without complication   . Dyslipidemia   . Nephrolithiasis   . Hematuria   . Ejection fraction   . Edema   . Shortness of breath     History reviewed. No pertinent past surgical history.  Patient Active Problem List   Diagnosis Date Noted  . Ejection fraction   . Edema   . Shortness of breath   . Hematuria   . Nephrolithiasis   . Hypokalemia 05/14/2012  . Dyslipidemia 05/14/2012  . Hypertension   . Coronary artery disease 05/06/2012  . DM (diabetes mellitus) 05/06/2012    ROS   Patient denies fever, chills, headache, sweats, rash, change in vision, change in hearing, chest pain, cough, nausea vomiting, all other systems are reviewed and are negative.  PHYSICAL EXAM  Patient is oriented to person time and place. Affect is normal. There is no jugulovenous distention. Lungs are clear. Respiratory effort is  nonlabored. Cardiac exam reveals S1 and S2. There no clicks or significant murmurs. Abdomen is soft. There's no peripheral edema.  Filed Vitals:   11/10/12 0853  BP: 148/85  Pulse: 91  Height: 5\' 10"  (1.778 m)  Weight: 217 lb 1.9 oz (98.485 kg)     ASSESSMENT & PLAN

## 2012-11-10 NOTE — Assessment & Plan Note (Signed)
Blood pressures control. No change in therapy. 

## 2012-11-10 NOTE — Patient Instructions (Signed)
Please keep your scheduled appointment with Dr. Myrtis Ser for 12/11/12. Your physician has recommended you make the following change in your medication: COMPLETELY STOP EFFIENT. Start plavix 75 mg daily on November 17, 2012. Your new prescription has been sent to your pharmacy. All other medications will remain the same.

## 2012-11-10 NOTE — Assessment & Plan Note (Signed)
Fortunately his hematuria is greatly improved.

## 2012-11-10 NOTE — Assessment & Plan Note (Signed)
He remained stable off Effient. The plan now will be to stay on aspirin only for one more week. Then I plan to change his medicine to Plavix. He will continue this for another 6 months. He'll see him back in November for followup.

## 2012-11-23 DIAGNOSIS — Z23 Encounter for immunization: Secondary | ICD-10-CM | POA: Diagnosis not present

## 2012-11-23 DIAGNOSIS — E119 Type 2 diabetes mellitus without complications: Secondary | ICD-10-CM | POA: Diagnosis not present

## 2012-11-23 DIAGNOSIS — I2510 Atherosclerotic heart disease of native coronary artery without angina pectoris: Secondary | ICD-10-CM | POA: Diagnosis not present

## 2012-11-23 DIAGNOSIS — I10 Essential (primary) hypertension: Secondary | ICD-10-CM | POA: Diagnosis not present

## 2012-11-23 DIAGNOSIS — N200 Calculus of kidney: Secondary | ICD-10-CM | POA: Diagnosis not present

## 2012-12-11 ENCOUNTER — Ambulatory Visit (INDEPENDENT_AMBULATORY_CARE_PROVIDER_SITE_OTHER): Payer: Medicare Other | Admitting: Cardiology

## 2012-12-11 ENCOUNTER — Encounter: Payer: Self-pay | Admitting: Cardiology

## 2012-12-11 VITALS — BP 150/75 | HR 75 | Ht 70.0 in | Wt 218.0 lb

## 2012-12-11 DIAGNOSIS — N200 Calculus of kidney: Secondary | ICD-10-CM | POA: Diagnosis not present

## 2012-12-11 DIAGNOSIS — I2510 Atherosclerotic heart disease of native coronary artery without angina pectoris: Secondary | ICD-10-CM | POA: Diagnosis not present

## 2012-12-11 DIAGNOSIS — I10 Essential (primary) hypertension: Secondary | ICD-10-CM

## 2012-12-11 NOTE — Progress Notes (Signed)
HPI  Patient is seen back to follow up as coronary disease. He had significant hematuria. We had to have him off of his Prasugrel for a period of time related hematuria. After that I restarted ) Plavix. He has done well. His follow up with urology is good. He's not having any more hematuria. His cardiac status is stable.  Allergies  Allergen Reactions  . Penicillins     Passed out as a child  . Contrast Media [Iodinated Diagnostic Agents]     Had "welts" on arm, and kidney issues after given dye in the past    Current Outpatient Prescriptions  Medication Sig Dispense Refill  . amLODipine (NORVASC) 10 MG tablet Take 1 tablet (10 mg total) by mouth daily.  30 tablet  3  . aspirin 81 MG tablet Take 81 mg by mouth every morning.       [name] atorvastatin (LIPITOR) 80 MG tablet Take 80 mg by mouth at bedtime.      . carvedilol (COREG) 25 MG tablet Take 1 tablet (25 mg total) by mouth 2 (two) times daily with a meal.  60 tablet  11  . ciprofloxacin (CIPRO) 500 MG tablet Take 500 mg by mouth 2 (two) times daily.       . clopidogrel (PLAVIX) 75 MG tablet Take 1 tablet (75 mg total) by mouth daily.  90 tablet  3  . furosemide (LASIX) 20 MG tablet Take 20 mg by mouth as needed.      [name] glimepiride (AMARYL) 1 MG tablet Take 1 mg by mouth daily before breakfast.      . HYDROcodone-acetaminophen (NORCO/VICODIN) 5-325 MG per tablet Take 1-2 tablets by mouth every 4 (four) hours as needed for pain.      . isosorbide mononitrate (IMDUR) 30 MG 24 hr tablet Take 1 tablet (30 mg total) by mouth daily.  30 tablet  11  . ketoconazole (NIZORAL) 2 % cream Apply 1 application topically 2 (two) times daily as needed (apply to face as needed (uses during the winter)). For dry skin      . lisinopril (PRINIVIL,ZESTRIL) 40 MG tablet Take 1 tablet (40 mg total) by mouth daily.  30 tablet  3  . metFORMIN (GLUCOPHAGE) 1000 MG tablet Take 1/2 tab (500mg ) by mouth every morning & 1 tab (1000mg ) by mouth every evening      .  nitroGLYCERIN (NITROSTAT) 0.4 MG SL tablet Place 1 tablet (0.4 mg total) under the tongue every 5 (five) minutes x 3 doses as needed for chest pain.  30 tablet  3  . potassium chloride SA (K-DUR,KLOR-CON) 20 MEQ tablet Take 1 tablet (20 mEq total) by mouth daily.  30 tablet  11  . tamsulosin (FLOMAX) 0.4 MG CAPS capsule Take 0.4 mg by mouth at bedtime.        No current facility-administered medications for this visit.    History   Social History  . Marital Status: Married    Spouse Name: N/A    Number of Children: N/A  . Years of Education: N/A   Occupational History  . Not on file.   Social History Main Topics  . Smoking status: Former Smoker    Types: Cigarettes    Start date: 01/25/1982  . Smokeless tobacco: Never Used  . Alcohol Use: No  . Drug Use: No  . Sexual Activity: Yes   Other Topics Concern  . Not on file   Social History Narrative  . No narrative on file  No family history on file.  Past Medical History  Diagnosis Date  . Hypertension   . Coronary artery disease   . Diabetes mellitus without complication   . Dyslipidemia   . Nephrolithiasis   . Hematuria   . Ejection fraction   . Edema   . Shortness of breath     History reviewed. No pertinent past surgical history.  Patient Active Problem List   Diagnosis Date Noted  . Ejection fraction   . Edema   . Shortness of breath   . Hematuria   . Nephrolithiasis   . Hypokalemia 05/14/2012  . Dyslipidemia 05/14/2012  . Hypertension   . Coronary artery disease 05/06/2012  . DM (diabetes mellitus) 05/06/2012    ROS   Patient denies fever, chills, headache, sweats, rash, change in vision, change in hearing, chest pain, cough, nausea vomiting, urinary symptoms. All other systems are reviewed and are negative.  PHYSICAL EXAM Patient's oriented to person time and place. Affect is normal. There is no jugulovenous distention. Lungs are clear. Respiratory effort is nonlabored. Cardiac exam reveals S1  and S2. There no clicks or significant murmurs. The abdomen is soft. There is no peripheral edema. Filed Vitals:   12/11/12 1301  BP: 150/75  Pulse: 75  Height: 5\' 10"  (1.778 m)  Weight: 218 lb (98.884 kg)     ASSESSMENT & PLAN

## 2012-12-11 NOTE — Assessment & Plan Note (Signed)
His systolic runs a little high at times but it varies during the day. He is losing weight. No change in therapy today.

## 2012-12-11 NOTE — Assessment & Plan Note (Signed)
Coronary disease is stable. He is now on Plavix and aspirin. This we continued until I see him in April., 2015

## 2012-12-11 NOTE — Patient Instructions (Signed)
Your physician recommends that you schedule a follow-up appointment in: April 2015. You will receive a reminder letter in the mail in about 2-3 months reminding you to call and schedule your appointment. If you don't receive this letter, please contact our office. Your physician recommends that you continue on your current medications as directed. Please refer to the Current Medication list given to you today.

## 2013-03-05 ENCOUNTER — Telehealth: Payer: Self-pay | Admitting: Cardiology

## 2013-03-05 NOTE — Telephone Encounter (Signed)
Pt called and stated that he has had chest pain on and off for two. Pt states that when he has the chest pain it is a 4/10 and it is not all the time. Pt states that on Friday he became light headed and dizzy. He did not pass out he sat down until he felt better. But usually pt does not have any light headiness or dizzines. Pt states he may be a little short of breath sometimes. Gave pt a appointment for 03-09-13 @ 1:45 with Dr. Ron Parker. Informed pt that if he's symptoms got worse to call 911 or go to ER. Pt agreed to appt and verbalized understanding.

## 2013-03-09 ENCOUNTER — Encounter: Payer: Self-pay | Admitting: Cardiology

## 2013-03-09 ENCOUNTER — Encounter: Payer: Self-pay | Admitting: *Deleted

## 2013-03-09 ENCOUNTER — Ambulatory Visit (INDEPENDENT_AMBULATORY_CARE_PROVIDER_SITE_OTHER): Payer: Medicare Other | Admitting: Cardiology

## 2013-03-09 VITALS — BP 159/99 | HR 93 | Ht 70.0 in | Wt 222.0 lb

## 2013-03-09 DIAGNOSIS — R079 Chest pain, unspecified: Secondary | ICD-10-CM | POA: Diagnosis not present

## 2013-03-09 DIAGNOSIS — E785 Hyperlipidemia, unspecified: Secondary | ICD-10-CM

## 2013-03-09 DIAGNOSIS — I2510 Atherosclerotic heart disease of native coronary artery without angina pectoris: Secondary | ICD-10-CM

## 2013-03-09 DIAGNOSIS — I10 Essential (primary) hypertension: Secondary | ICD-10-CM | POA: Diagnosis not present

## 2013-03-09 DIAGNOSIS — R319 Hematuria, unspecified: Secondary | ICD-10-CM

## 2013-03-09 NOTE — Assessment & Plan Note (Signed)
Patient has coronary disease. He had a non-STEMI in April, 2014. Drug-eluting stents were placed in the LAD and circumflex. Repeat catheterization later that month showed that the stents were patent. There was severe ostial stenosis of a small caliber ramus intermedius. This was not amenable to PCI. There was also moderate distal LAD disease. The patient has had some increased chest discomfort. It is possible that this could be a worsening of his coronary disease. He does not appear to be unstable. I decided not to change his medicines. I am arranging for him to have a stress nuclear scan in the [place] office. I will see him back for followup to discuss the findings. In the meantime I want him to go about full activities.

## 2013-03-09 NOTE — Assessment & Plan Note (Signed)
His lipids are being treated with high dose statin.

## 2013-03-09 NOTE — Progress Notes (Signed)
HPI  Patient is seen today for followup of coronary disease. He called to say that he was having chest discomfort. He does have coronary disease. In April of 2014 he had a coronary intervention. Soon after that there was a relook cath. The stents were patent. He does have some ostial disease of a branch that could cause him some symptoms. He also has distal LAD disease. More recently through the past year he had severe hematuria. I had to take him off his dual antiplatelet therapy to treat his renal stones. This was done successfully and he is doing well. Last week he had a day when he felt weak and tired most of the day. Since that time he actually had a day where he did heavy work out in [place] land with no significant problems. He has intermittent chest discomfort. He also has some intermittent discomfort between his shoulder blades.  Allergies  Allergen Reactions  . Penicillins     Passed out as a child  . Contrast Media [Iodinated Diagnostic Agents]     Had "welts" on arm, and kidney issues after given dye in the past    Current Outpatient Prescriptions  Medication Sig Dispense Refill  . amLODipine (NORVASC) 10 MG tablet Take 1 tablet (10 mg total) by mouth daily.  30 tablet  3  . aspirin (CHILDRENS ASPIRIN) 81 MG chewable tablet Chew by mouth.      [name] atorvastatin (LIPITOR) 80 MG tablet Take 80 mg by mouth at bedtime.      . Blood Glucose Monitoring Suppl (RA BLOOD GLUCOSE MONITOR) DEVI by Does not apply route.      . carvedilol (COREG) 25 MG tablet Take 1 tablet (25 mg total) by mouth 2 (two) times daily with a meal.  60 tablet  11  . clopidogrel (PLAVIX) 75 MG tablet Take 1 tablet (75 mg total) by mouth daily.  90 tablet  3  . furosemide (LASIX) 20 MG tablet Take 20 mg by mouth as needed.      [name] glimepiride (AMARYL) 1 MG tablet Take 1 mg by mouth daily with breakfast.       . glucose blood test strip 1 Strip by Other route two times daily      . HYDROcodone-acetaminophen  (NORCO/VICODIN) 5-325 MG per tablet Take 1-2 tablets by mouth every 4 (four) hours as needed for pain.      . isosorbide mononitrate (IMDUR) 30 MG 24 hr tablet Take 1 tablet (30 mg total) by mouth daily.  30 tablet  11  . ketoconazole (NIZORAL) 2 % cream Apply 1 application topically 2 (two) times daily as needed (apply to face as needed (uses during the winter)). For dry skin      . lisinopril (PRINIVIL,ZESTRIL) 40 MG tablet Take 1 tablet (40 mg total) by mouth daily.  30 tablet  3  . metFORMIN (GLUCOPHAGE XR) 500 MG 24 hr tablet Take 1,500 mg by mouth daily with breakfast.       . potassium chloride SA (K-DUR,KLOR-CON) 20 MEQ tablet Take 1 tablet (20 mEq total) by mouth daily.  30 tablet  11  . sildenafil (VIAGRA) 50 MG tablet Take by mouth.      . tamsulosin (FLOMAX) 0.4 MG CAPS capsule Take 0.4 mg by mouth at bedtime.       . nitroGLYCERIN (NITROSTAT) 0.4 MG SL tablet Place 1 tablet (0.4 mg total) under the tongue every 5 (five) minutes x 3 doses as needed for chest pain.  30 tablet  3   No current facility-administered medications for this visit.    History   Social History  . Marital Status: Married    Spouse Name: N/A    Number of Children: N/A  . Years of Education: N/A   Occupational History  . Not on file.   Social History Main Topics  . Smoking status: Former Smoker    Types: Cigarettes    Start date: 01/25/1982  . Smokeless tobacco: Never Used  . Alcohol Use: No  . Drug Use: No  . Sexual Activity: Yes   Other Topics Concern  . Not on file   Social History Narrative  . No narrative on file    No family history on file.  Past Medical History  Diagnosis Date  . Hypertension   . Coronary artery disease   . Diabetes mellitus without complication   . Dyslipidemia   . Nephrolithiasis   . Hematuria   . Ejection fraction   . Edema   . Shortness of breath     History reviewed. No pertinent past surgical history.  Patient Active Problem List   Diagnosis Date  Noted  . Ejection fraction   . Edema   . Shortness of breath   . Hematuria   . Nephrolithiasis   . Hypokalemia 05/14/2012  . Dyslipidemia 05/14/2012  . Hypertension   . Coronary artery disease 05/06/2012  . DM (diabetes mellitus) 05/06/2012    ROS   Patient denies fever, chills, headache, sweats, rash, change in vision, change in hearing, cough, nausea or vomiting, urinary symptoms. All other systems are reviewed and are negative.  PHYSICAL EXAM Patient is oriented to person time and place. Affect is normal. He is here with his wife. There is no jugulovenous distention. Lungs are clear. Respiratory effort is nonlabored. Cardiac exam reveals S1 and S2. There no clicks or significant murmurs. The abdomen is soft. There is no peripheral edema. There no musculoskeletal deformities. There are no skin rashes.  Filed Vitals:   03/09/13 1355 03/09/13 1408 03/09/13 1411 03/09/13 1412  BP:  168/92 166/91 159/99  Pulse:  82 83 93  Height: 5\' 10"  (1.778 m)     Weight: 222 lb (100.699 kg)     SpO2:  97% 97% 97%   EKG is done today and reviewed by me. There is normal sinus rhythm. EKG is normal.  ASSESSMENT & PLAN

## 2013-03-09 NOTE — Assessment & Plan Note (Signed)
Fortunately his hematuria is resolved after his urologic problems were treated definitively.  As part of today's evaluation I spent greater than 25 minutes with is total care. For that half of this time has been with direct contact talking with the patient and his wife. We talked at length about his symptoms and the plan for the evaluation.

## 2013-03-09 NOTE — Assessment & Plan Note (Signed)
The patient's blood pressure is under control. He takes it regularly at home and the systolic is in the 802 range. Diastolic is in the 85 range.

## 2013-03-09 NOTE — Patient Instructions (Signed)
Your physician recommends that you schedule a follow-up appointment on March 27, 2013. Your physician recommends that you continue on your current medications as directed. Please refer to the Current Medication list given to you today. Your physician has requested that you have en exercise stress myoview. For further information please visit [email or web address]. Please follow instruction sheet, as given.

## 2013-03-14 DIAGNOSIS — R319 Hematuria, unspecified: Secondary | ICD-10-CM | POA: Diagnosis not present

## 2013-03-14 DIAGNOSIS — N201 Calculus of ureter: Secondary | ICD-10-CM | POA: Diagnosis not present

## 2013-03-14 DIAGNOSIS — R3915 Urgency of urination: Secondary | ICD-10-CM | POA: Diagnosis not present

## 2013-03-16 DIAGNOSIS — R3915 Urgency of urination: Secondary | ICD-10-CM | POA: Diagnosis not present

## 2013-03-16 DIAGNOSIS — R319 Hematuria, unspecified: Secondary | ICD-10-CM | POA: Diagnosis not present

## 2013-03-16 DIAGNOSIS — N201 Calculus of ureter: Secondary | ICD-10-CM | POA: Diagnosis not present

## 2013-03-20 ENCOUNTER — Ambulatory Visit (HOSPITAL_COMMUNITY): Payer: Medicare Other | Attending: Cardiology | Admitting: Radiology

## 2013-03-20 ENCOUNTER — Encounter: Payer: Self-pay | Admitting: Cardiology

## 2013-03-20 VITALS — BP 141/84 | Ht 70.0 in | Wt 219.0 lb

## 2013-03-20 DIAGNOSIS — I252 Old myocardial infarction: Secondary | ICD-10-CM | POA: Diagnosis not present

## 2013-03-20 DIAGNOSIS — R5381 Other malaise: Secondary | ICD-10-CM | POA: Insufficient documentation

## 2013-03-20 DIAGNOSIS — Z8249 Family history of ischemic heart disease and other diseases of the circulatory system: Secondary | ICD-10-CM | POA: Insufficient documentation

## 2013-03-20 DIAGNOSIS — E119 Type 2 diabetes mellitus without complications: Secondary | ICD-10-CM | POA: Insufficient documentation

## 2013-03-20 DIAGNOSIS — R079 Chest pain, unspecified: Secondary | ICD-10-CM | POA: Diagnosis not present

## 2013-03-20 DIAGNOSIS — I10 Essential (primary) hypertension: Secondary | ICD-10-CM | POA: Insufficient documentation

## 2013-03-20 DIAGNOSIS — E785 Hyperlipidemia, unspecified: Secondary | ICD-10-CM | POA: Diagnosis not present

## 2013-03-20 DIAGNOSIS — Z87891 Personal history of nicotine dependence: Secondary | ICD-10-CM | POA: Insufficient documentation

## 2013-03-20 DIAGNOSIS — R5383 Other fatigue: Secondary | ICD-10-CM

## 2013-03-20 DIAGNOSIS — I2510 Atherosclerotic heart disease of native coronary artery without angina pectoris: Secondary | ICD-10-CM | POA: Diagnosis not present

## 2013-03-20 DIAGNOSIS — R002 Palpitations: Secondary | ICD-10-CM | POA: Diagnosis not present

## 2013-03-20 MED ORDER — TECHNETIUM TC 99M SESTAMIBI GENERIC - CARDIOLITE
10.0000 | Freq: Once | INTRAVENOUS | Status: AC | PRN
  Administered 2013-03-20: 10 via INTRAVENOUS

## 2013-03-20 MED ORDER — TECHNETIUM TC 99M SESTAMIBI GENERIC - CARDIOLITE
30.0000 | Freq: Once | INTRAVENOUS | Status: AC | PRN
  Administered 2013-03-20: 30 via INTRAVENOUS

## 2013-03-20 NOTE — Progress Notes (Signed)
[facility] [address] [place], [place] [address] [phone]    Cardiology Nuclear Med Study  Sean Villarreal is a 73 y.o. male     MRN : [id number]     DOB: 1941-01-24  Procedure Date: 03/20/2013  Nuclear Med Background Indication for Stress Test:  Evaluation for Ischemia and Stent Patency History:  MPI: 25 yrs ago, CAD, MI-Cath-Severe Stenosis RI PCI not amenable Stent: LAD/CFX   05/14/12 ECHO: mild LVH EF: 55-60% Cardiac Risk Factors: Family History - CAD, History of Smoking, Hypertension, Lipids and NIDDM  Symptoms:  Chest Pain, Fatigue and Palpitations   Nuclear Pre-Procedure Caffeine/Decaff Intake:  None > 12 hrs NPO After: 8:00pm   Lungs:  clear O2 Sat: 95% on room air. IV 0.9% NS with Angio Cath:  22g  IV Site: R Wrist  IV Started by:  Irven Baltimore, RN  Chest Size (in):  42 Cup Size: n/a  Height: 5\' 10"  (1.778 m)  Weight:  219 lb (99.338 kg)  BMI:  Body mass index is 31.42 kg/(m^2). Tech Comments:  Held Coreg x 24 hrs. Held Amaryl and Glucophage this am.    Nuclear Med Study 1 or 2 day study: 1 day  Stress Test Type:  Stress  Reading MD: N/A  Order Authorizing Provider:  Dola Argyle, MD  Resting Radionuclide: Technetium 53m Sestamibi  Resting Radionuclide Dose: 11.0 mCi   Stress Radionuclide:  Technetium 28m Sestamibi  Stress Radionuclide Dose: 33.0 mCi           Stress Protocol Rest HR: 82 Stress HR: 148  Rest BP: 141/84 Stress BP: 180/84  Exercise Time (min): 4:45 METS: 6.76   Predicted Max HR: 149 bpm % Max HR: 99.33 bpm Rate Pressure Product: [phone]   Dose of Adenosine (mg):  n/a Dose of Lexiscan: n/a mg  Dose of Atropine (mg): n/a Dose of Dobutamine: n/a mcg/kg/min (at max HR)  Stress Test Technologist: Perrin Maltese, EMT-P  Nuclear Technologist:  Vedia Pereyra, CNMT     Rest Procedure:  Myocardial perfusion imaging was performed at rest 45 minutes following the intravenous administration of Technetium 64m  Sestamibi. Rest ECG: NSR - Normal EKG  Stress Procedure:  The patient exercised on the treadmill utilizing the Bruce Protocol for 4:45 minutes. The patient stopped due to sob and denied any chest pain.  Technetium 32m Sestamibi was injected at peak exercise and myocardial perfusion imaging was performed after a brief delay. Stress ECG: No significant change from baseline ECG  QPS Raw Data Images:  Normal; no motion artifact; normal heart/lung ratio. Stress Images:  There is a very small, mild area of attenuation of the apex and a small-medium sized area of mild-moderate attenuation in the basal/mid  inferior wall  .   Rest Images:  There is small-medium sized area of mild - moderate attenuation of the basal and mid inferior wall.       Subtraction (SDS):  no significant ischemia.  There appears to be a fixed defect in the mid-basal inferior wall.   Transient Ischemic Dilatation (Normal <1.22):  1.03 Lung/Heart Ratio (Normal <0.45):  0.34  Quantitative Gated Spect Images QGS EDV:  124 ml QGS ESV:  58 ml  Impression Exercise Capacity:  Fair exercise capacity. BP Response:  Normal blood pressure response. Clinical Symptoms:  No significant symptoms noted. ECG Impression:  No significant ST segment change suggestive of ischemia. Comparison with Prior Nuclear Study: No images to compare  Overall Impression:  Low  risk stress nuclear study .  There small, mildly reversible  defect in the apex and a fixed defect in the  mid-basal inferior wall.  .  LV Ejection Fraction: 53%.  LV Wall Motion:  NL LV Function; NL Wall Motion.   Thayer Headings, Brooke Bonito., MD, [facility] 03/21/2013, 1:37 PM Office - [phone] Pager [phone]

## 2013-03-27 ENCOUNTER — Ambulatory Visit (INDEPENDENT_AMBULATORY_CARE_PROVIDER_SITE_OTHER): Payer: Medicare Other | Admitting: Cardiology

## 2013-03-27 ENCOUNTER — Encounter: Payer: Self-pay | Admitting: Cardiology

## 2013-03-27 VITALS — BP 138/73 | HR 77 | Ht 70.0 in | Wt 221.1 lb

## 2013-03-27 DIAGNOSIS — I10 Essential (primary) hypertension: Secondary | ICD-10-CM

## 2013-03-27 DIAGNOSIS — I2510 Atherosclerotic heart disease of native coronary artery without angina pectoris: Secondary | ICD-10-CM

## 2013-03-27 DIAGNOSIS — R319 Hematuria, unspecified: Secondary | ICD-10-CM

## 2013-03-27 NOTE — Assessment & Plan Note (Signed)
Fortunately he has not had any return of significant hematuria.

## 2013-03-27 NOTE — Assessment & Plan Note (Signed)
Blood pressure is controlled. No change in therapy. 

## 2013-03-27 NOTE — Assessment & Plan Note (Signed)
The patient had a followup nuclear scan. The EF was 53%. Wall motion was normal. This was a low risk scan. There were mild abnormalities that could be related to his residual disease. He is feeling better. At this point I feel that his coronary status is stable. He will continue usual activities and I will see him back for followup in April.

## 2013-03-27 NOTE — Patient Instructions (Signed)
Your physician recommends that you schedule a follow-up appointment in: keep already scheduled appointment for  May 23, 2013. Your physician recommends that you continue on your current medications as directed. Please refer to the Current Medication list given to you today.

## 2013-03-27 NOTE — Progress Notes (Signed)
HPI  The patient is seen today to followup coronary artery disease. I saw him last March 09, 2013. I was concerned about his symptoms and we proceeded with a stress nuclear scan. This was a low risk scan. It is not completely normal. However we know he has some residual disease that could cause mild abnormalities. The patient is feeling better today. He is active and not having any significant exertional symptoms.  Allergies  Allergen Reactions  . Penicillins     Passed out as a child  . Contrast Media [Iodinated Diagnostic Agents]     Had "welts" on arm, and kidney issues after given dye in the past    Current Outpatient Prescriptions  Medication Sig Dispense Refill  . amLODipine (NORVASC) 10 MG tablet Take 1 tablet (10 mg total) by mouth daily.  30 tablet  3  . aspirin 81 MG tablet Take 81 mg by mouth daily.      [name] atorvastatin (LIPITOR) 80 MG tablet Take 80 mg by mouth at bedtime.      . carvedilol (COREG) 25 MG tablet Take 1 tablet (25 mg total) by mouth 2 (two) times daily with a meal.  60 tablet  11  . clopidogrel (PLAVIX) 75 MG tablet Take 1 tablet (75 mg total) by mouth daily.  90 tablet  3  . furosemide (LASIX) 20 MG tablet Take 20 mg by mouth as needed.      [name] glimepiride (AMARYL) 1 MG tablet Take 1 mg by mouth daily with breakfast.       . isosorbide mononitrate (IMDUR) 30 MG 24 hr tablet Take 1 tablet (30 mg total) by mouth daily.  30 tablet  11  . ketoconazole (NIZORAL) 2 % cream Apply 1 application topically 2 (two) times daily as needed (apply to face as needed (uses during the winter)). For dry skin      . lisinopril (PRINIVIL,ZESTRIL) 40 MG tablet Take 1 tablet (40 mg total) by mouth daily.  30 tablet  3  . metFORMIN (GLUCOPHAGE XR) 500 MG 24 hr tablet Take 1,500 mg by mouth daily with breakfast.       . nitroGLYCERIN (NITROSTAT) 0.4 MG SL tablet Place 1 tablet (0.4 mg total) under the tongue every 5 (five) minutes x 3 doses as needed for chest pain.  30 tablet  3    . potassium chloride SA (K-DUR,KLOR-CON) 20 MEQ tablet Take 1 tablet (20 mEq total) by mouth daily.  30 tablet  11  . tamsulosin (FLOMAX) 0.4 MG CAPS capsule Take 0.4 mg by mouth at bedtime.        No current facility-administered medications for this visit.    History   Social History  . Marital Status: Married    Spouse Name: N/A    Number of Children: N/A  . Years of Education: N/A   Occupational History  . Not on file.   Social History Main Topics  . Smoking status: Former Smoker    Types: Cigarettes    Start date: 01/25/1982  . Smokeless tobacco: Never Used  . Alcohol Use: No  . Drug Use: No  . Sexual Activity: Yes   Other Topics Concern  . Not on file   Social History Narrative  . No narrative on file    No family history on file.  Past Medical History  Diagnosis Date  . Hypertension   . Coronary artery disease   . Diabetes mellitus without complication   . Dyslipidemia   .  Nephrolithiasis   . Hematuria   . Ejection fraction   . Edema   . Shortness of breath     History reviewed. No pertinent past surgical history.  Patient Active Problem List   Diagnosis Date Noted  . Ejection fraction   . Edema   . Shortness of breath   . Hematuria   . Nephrolithiasis   . Hypokalemia 05/14/2012  . Dyslipidemia 05/14/2012  . Hypertension   . Coronary artery disease 05/06/2012  . DM (diabetes mellitus) 05/06/2012    ROS   Patient denies fever, chills, headache, sweats, rash, change in vision, change in hearing, chest pain, cough, nausea or vomiting, urinary symptoms. All other systems are reviewed and are negative.  PHYSICAL EXAM  Patient's here with his wife. He is oriented to person time and place. Affect is normal. There is no jugulovenous distention. Lungs are clear. Respiratory effort is nonlabored. Cardiac exam reveals S1 and S2. There no clicks or significant murmurs. Abdomen is soft. Is no peripheral edema.  Filed Vitals:   03/27/13 1127  BP:  138/73  Pulse: 77  Height: 5\' 10"  (1.778 m)  Weight: 221 lb 1.9 oz (100.299 kg)  SpO2: 98%     ASSESSMENT & PLAN

## 2013-05-23 ENCOUNTER — Encounter: Payer: Self-pay | Admitting: Cardiology

## 2013-05-23 ENCOUNTER — Ambulatory Visit (INDEPENDENT_AMBULATORY_CARE_PROVIDER_SITE_OTHER): Payer: Medicare Other | Admitting: Cardiology

## 2013-05-23 VITALS — BP 146/85 | HR 78 | Ht 70.0 in | Wt 223.0 lb

## 2013-05-23 DIAGNOSIS — I2510 Atherosclerotic heart disease of native coronary artery without angina pectoris: Secondary | ICD-10-CM

## 2013-05-23 DIAGNOSIS — I10 Essential (primary) hypertension: Secondary | ICD-10-CM | POA: Diagnosis not present

## 2013-05-23 DIAGNOSIS — R0602 Shortness of breath: Secondary | ICD-10-CM | POA: Diagnosis not present

## 2013-05-23 DIAGNOSIS — K219 Gastro-esophageal reflux disease without esophagitis: Secondary | ICD-10-CM

## 2013-05-23 MED ORDER — NITROGLYCERIN 0.4 MG SL SUBL: 0.4000 mg | SUBLINGUAL_TABLET | SUBLINGUAL | Status: DC | PRN

## 2013-05-23 NOTE — Assessment & Plan Note (Signed)
Coronary disease is stable. No change in therapy. 

## 2013-05-23 NOTE — Assessment & Plan Note (Signed)
Blood pressure is controlled. No change in therapy. 

## 2013-05-23 NOTE — Progress Notes (Signed)
Patient ID: Sean Villarreal, male   DOB: 06/25/1940, 73 y.o.   MRN: [id number]    HPI  Patient is seen back to followup his coronary disease. I saw him last March 27, 2013. He continues to do well. We did do a nuclear scan earlier in the year. It was low risk. Fortunately he's not having any more hematuria or kidney stones. He thinks that the reflux has been part of this issue and he is taking medications for this.  Allergies  Allergen Reactions  . Penicillins     Passed out as a child  . Contrast Media [Iodinated Diagnostic Agents]     Had "welts" on arm, and kidney issues after given dye in the past    Current Outpatient Prescriptions  Medication Sig Dispense Refill  . amLODipine (NORVASC) 10 MG tablet Take 1 tablet (10 mg total) by mouth daily.  30 tablet  3  . aspirin 81 MG tablet Take 81 mg by mouth daily.      [name] atorvastatin (LIPITOR) 80 MG tablet Take 80 mg by mouth at bedtime.      . carvedilol (COREG) 25 MG tablet Take 1 tablet (25 mg total) by mouth 2 (two) times daily with a meal.  60 tablet  11  . clopidogrel (PLAVIX) 75 MG tablet Take 1 tablet (75 mg total) by mouth daily.  90 tablet  3  . furosemide (LASIX) 20 MG tablet Take 20 mg by mouth as needed.      [name] glimepiride (AMARYL) 1 MG tablet Take 1 mg by mouth daily with breakfast.       . isosorbide mononitrate (IMDUR) 30 MG 24 hr tablet Take 1 tablet (30 mg total) by mouth daily.  30 tablet  11  . ketoconazole (NIZORAL) 2 % cream Apply 1 application topically 2 (two) times daily as needed (apply to face as needed (uses during the winter)). For dry skin      . lisinopril (PRINIVIL,ZESTRIL) 40 MG tablet Take 1 tablet (40 mg total) by mouth daily.  30 tablet  3  . metFORMIN (GLUCOPHAGE XR) 500 MG 24 hr tablet Take 1,500 mg by mouth daily with breakfast.       . nitroGLYCERIN (NITROSTAT) 0.4 MG SL tablet Place 1 tablet (0.4 mg total) under the tongue every 5 (five) minutes x 3 doses as needed for chest pain.  30 tablet  3  .  potassium chloride SA (K-DUR,KLOR-CON) 20 MEQ tablet Take 1 tablet (20 mEq total) by mouth daily.  30 tablet  11  . tamsulosin (FLOMAX) 0.4 MG CAPS capsule Take 0.4 mg by mouth at bedtime.        No current facility-administered medications for this visit.    History   Social History  . Marital Status: Married    Spouse Name: N/A    Number of Children: N/A  . Years of Education: N/A   Occupational History  . Not on file.   Social History Main Topics  . Smoking status: Former Smoker    Types: Cigarettes    Start date: 01/25/1982  . Smokeless tobacco: Never Used  . Alcohol Use: No  . Drug Use: No  . Sexual Activity: Yes   Other Topics Concern  . Not on file   Social History Narrative  . No narrative on file    No family history on file.  Past Medical History  Diagnosis Date  . Hypertension   . Coronary artery disease   . Diabetes mellitus  without complication   . Dyslipidemia   . Nephrolithiasis   . Hematuria   . Ejection fraction   . Edema   . Shortness of breath     History reviewed. No pertinent past surgical history.  Patient Active Problem List   Diagnosis Date Noted  . Ejection fraction   . Edema   . Shortness of breath   . Hematuria   . Nephrolithiasis   . Hypokalemia 05/14/2012  . Dyslipidemia 05/14/2012  . Hypertension   . Coronary artery disease 05/06/2012  . DM (diabetes mellitus) 05/06/2012    ROS   Patient denies fever, chills, headache, sweats, rash, change in vision, change in hearing, chest pain, cough, nausea vomiting, urinary symptoms. All other systems are reviewed and are negative.  PHYSICAL EXAM  Patient looks quite good. He is oriented to person time and place. Affect is normal. There is no jugulovenous distention. Head is atraumatic. Sclera and conjunctiva are normal. Lungs are clear. Respiratory effort is nonlabored. Cardiac exam reveals S1 and S2. There no clicks or significant murmurs. The abdomen is soft. There is no  peripheral edema.  Filed Vitals:   05/23/13 0810  BP: 146/85  Pulse: 78  Height: 5\' 10"  (1.778 m)  Weight: 223 lb (101.152 kg)  SpO2: 96%     ASSESSMENT & PLAN

## 2013-05-23 NOTE — Patient Instructions (Signed)

## 2013-05-23 NOTE — Assessment & Plan Note (Signed)
Is not having any significant shortness of breath. No change in therapy.

## 2013-05-23 NOTE — Assessment & Plan Note (Signed)
He is taking an over-the-counter him medication for GERD that is helping. I instructed him concerning when to take the medicine and how to be careful about always being upright after he has eaten until he is digesting his food.

## 2013-05-24 DIAGNOSIS — E7800 Pure hypercholesterolemia, unspecified: Secondary | ICD-10-CM | POA: Diagnosis not present

## 2013-05-24 DIAGNOSIS — E119 Type 2 diabetes mellitus without complications: Secondary | ICD-10-CM | POA: Diagnosis not present

## 2013-05-24 DIAGNOSIS — I2510 Atherosclerotic heart disease of native coronary artery without angina pectoris: Secondary | ICD-10-CM | POA: Diagnosis not present

## 2013-05-24 DIAGNOSIS — I10 Essential (primary) hypertension: Secondary | ICD-10-CM | POA: Diagnosis not present

## 2013-06-11 DIAGNOSIS — R319 Hematuria, unspecified: Secondary | ICD-10-CM | POA: Diagnosis not present

## 2013-06-11 DIAGNOSIS — R3915 Urgency of urination: Secondary | ICD-10-CM | POA: Diagnosis not present

## 2013-06-11 DIAGNOSIS — Z87442 Personal history of urinary calculi: Secondary | ICD-10-CM | POA: Diagnosis not present

## 2013-07-02 DIAGNOSIS — D492 Neoplasm of unspecified behavior of bone, soft tissue, and skin: Secondary | ICD-10-CM | POA: Diagnosis not present

## 2013-07-02 DIAGNOSIS — L821 Other seborrheic keratosis: Secondary | ICD-10-CM | POA: Diagnosis not present

## 2013-07-02 DIAGNOSIS — C44221 Squamous cell carcinoma of skin of unspecified ear and external auricular canal: Secondary | ICD-10-CM | POA: Diagnosis not present

## 2013-07-02 DIAGNOSIS — L570 Actinic keratosis: Secondary | ICD-10-CM | POA: Diagnosis not present

## 2013-07-02 DIAGNOSIS — Z85828 Personal history of other malignant neoplasm of skin: Secondary | ICD-10-CM | POA: Diagnosis not present

## 2013-07-02 DIAGNOSIS — D239 Other benign neoplasm of skin, unspecified: Secondary | ICD-10-CM | POA: Diagnosis not present

## 2013-07-02 DIAGNOSIS — L819 Disorder of pigmentation, unspecified: Secondary | ICD-10-CM | POA: Diagnosis not present

## 2013-08-21 DIAGNOSIS — C44221 Squamous cell carcinoma of skin of unspecified ear and external auricular canal: Secondary | ICD-10-CM | POA: Diagnosis not present

## 2013-08-21 DIAGNOSIS — L905 Scar conditions and fibrosis of skin: Secondary | ICD-10-CM | POA: Diagnosis not present

## 2013-10-31 ENCOUNTER — Other Ambulatory Visit: Payer: Self-pay | Admitting: Cardiology

## 2013-11-05 ENCOUNTER — Ambulatory Visit (INDEPENDENT_AMBULATORY_CARE_PROVIDER_SITE_OTHER): Payer: Medicare Other | Admitting: Cardiology

## 2013-11-05 ENCOUNTER — Encounter: Payer: Self-pay | Admitting: Cardiology

## 2013-11-05 VITALS — BP 146/79 | HR 82 | Ht 69.0 in | Wt 220.5 lb

## 2013-11-05 DIAGNOSIS — Z01810 Encounter for preprocedural cardiovascular examination: Secondary | ICD-10-CM | POA: Diagnosis not present

## 2013-11-05 DIAGNOSIS — R0602 Shortness of breath: Secondary | ICD-10-CM

## 2013-11-05 DIAGNOSIS — I2510 Atherosclerotic heart disease of native coronary artery without angina pectoris: Secondary | ICD-10-CM

## 2013-11-05 DIAGNOSIS — R609 Edema, unspecified: Secondary | ICD-10-CM | POA: Diagnosis not present

## 2013-11-05 DIAGNOSIS — E785 Hyperlipidemia, unspecified: Secondary | ICD-10-CM | POA: Diagnosis not present

## 2013-11-05 DIAGNOSIS — I10 Essential (primary) hypertension: Secondary | ICD-10-CM

## 2013-11-05 NOTE — Assessment & Plan Note (Signed)
The patient may be having a prostate procedure in the near future. I have reviewed unit carefully all the information about dual antiplatelet therapy over time. We were forced to hold dual antiplatelet therapy when he had severe hematuria. This occurred at 6 months after his stent. It will be safe for him to hold his Plavix 5 days before a urologic procedure if this is needed. The Plavix can then be resumed after the procedure.  As part of today's evaluation I spent greater than 25 minutes with his total care. More than half of this time is been with direct contact with the patient.

## 2013-11-05 NOTE — Assessment & Plan Note (Signed)
He is on guideline directed therapy for his lipids. No change in therapy.

## 2013-11-05 NOTE — Assessment & Plan Note (Signed)
His coronary disease is stable. Last nuclear scan was February, 2015. This was a low-risk scan with small reversible defect. He's not having any significant symptoms. No further workup at this time.

## 2013-11-05 NOTE — Assessment & Plan Note (Signed)
He has mild intermittent edema. This is treated well with a small dose of diuretic as needed

## 2013-11-05 NOTE — Patient Instructions (Signed)

## 2013-11-05 NOTE — Assessment & Plan Note (Signed)
Blood pressure is top normal today. No change in therapy.

## 2013-11-05 NOTE — Assessment & Plan Note (Signed)
He's had 2 mild episodes of shortness of breath at nighttime over several months. I suspect this is related to mild fluid overload. He will be watching his weight fully. If he gained any weight or notes any edema, he will start his small dose of Lasix.

## 2013-11-05 NOTE — Progress Notes (Signed)
Patient ID: Sean Villarreal, male   DOB: [birth year], 73 y.o.   MRN: [id number]    HPI  Patient is seen today to followup coronary disease. I saw him last in April, 2015. He stable from the cardiac viewpoint. 6 months after his most recent stent, we had to hold his dual antiplatelet therapy due to severe hematuria. Fortunately this stabilize and we were able to get him back on dual antiplatelet therapy. He's been stable since then. He's not having any hematuria. He is having some urinary outflow symptoms. He will be following up with his urologist. Also intermittently he has mild fluid overload. He uses a small dose of Lasix on a when necessary basis and it is very effective. He has had some mild shortness of breath at nighttime on 2 occasions. I believe this is related to his volume status.  Allergies  Allergen Reactions  . Penicillins     Passed out as a child  . Contrast Media [Iodinated Diagnostic Agents]     Had "welts" on arm, and kidney issues after given dye in the past    Current Outpatient Prescriptions  Medication Sig Dispense Refill  . amLODipine (NORVASC) 10 MG tablet Take 1 tablet (10 mg total) by mouth daily.  30 tablet  3  . aspirin 81 MG tablet Take 81 mg by mouth daily.      [name] atorvastatin (LIPITOR) 80 MG tablet Take 80 mg by mouth at bedtime.      . carvedilol (COREG) 25 MG tablet Take 1 tablet (25 mg total) by mouth 2 (two) times daily with a meal.  60 tablet  11  . clopidogrel (PLAVIX) 75 MG tablet TAKE 1 TABLET BY MOUTH EVERY DAY  90 tablet  3  . furosemide (LASIX) 20 MG tablet Take 20 mg by mouth as needed.      [name] glimepiride (AMARYL) 1 MG tablet Take 1 mg by mouth daily with breakfast.       . isosorbide mononitrate (IMDUR) 30 MG 24 hr tablet Take 1 tablet (30 mg total) by mouth daily.  30 tablet  11  . ketoconazole (NIZORAL) 2 % cream Apply 1 application topically 2 (two) times daily as needed (apply to face as needed (uses during the winter)). For dry skin      .  lisinopril (PRINIVIL,ZESTRIL) 40 MG tablet Take 1 tablet (40 mg total) by mouth daily.  30 tablet  3  . metFORMIN (GLUCOPHAGE XR) 500 MG 24 hr tablet Take 1,500 mg by mouth daily with breakfast.       . nitroGLYCERIN (NITROSTAT) 0.4 MG SL tablet Place 1 tablet (0.4 mg total) under the tongue every 5 (five) minutes x 3 doses as needed for chest pain.  30 tablet  3  . potassium chloride SA (K-DUR,KLOR-CON) 20 MEQ tablet Take 1 tablet (20 mEq total) by mouth daily.  30 tablet  11  . tamsulosin (FLOMAX) 0.4 MG CAPS capsule Take 0.4 mg by mouth 2 (two) times daily.        No current facility-administered medications for this visit.    History   Social History  . Marital Status: Married    Spouse Name: N/A    Number of Children: N/A  . Years of Education: N/A   Occupational History  . Not on file.   Social History Main Topics  . Smoking status: Former Smoker    Types: Cigarettes    Start date: 01/25/1982    Quit date: 11/05/1988  .  Smokeless tobacco: Never Used  . Alcohol Use: Yes     Comment: 11/05/13 Very seldom  . Drug Use: No  . Sexual Activity: Yes   Other Topics Concern  . Not on file   Social History Narrative  . No narrative on file    No family history on file.  Past Medical History  Diagnosis Date  . Hypertension   . Coronary artery disease   . Diabetes mellitus without complication   . Dyslipidemia   . Nephrolithiasis   . Hematuria   . Ejection fraction   . Edema   . Shortness of breath     History reviewed. No pertinent past surgical history.  Patient Active Problem List   Diagnosis Date Noted  . GERD (gastroesophageal reflux disease) 05/23/2013  . Ejection fraction   . Edema   . Shortness of breath   . Hematuria   . Nephrolithiasis   . Hypokalemia 05/14/2012  . Dyslipidemia 05/14/2012  . Hypertension   . Coronary artery disease 05/06/2012  . DM (diabetes mellitus) 05/06/2012    ROS   Patient denies fever, chills, headache, sweats, rash,  change in vision, change in hearing, chest pain, cough, nausea or vomiting,. All other systems are reviewed and are negative.  PHYSICAL EXAM  Patient appears stable today. Head is atraumatic. Sclera and conjunctiva are normal. There is no jugulovenous distention. Lungs are clear. Respiratory effort is nonlabored. Cardiac exam reveals S1 and S2. The abdomen is soft. There is no peripheral edema. There no musculoskeletal deformities. There are no skin rashes.  Filed Vitals:   11/05/13 1433  BP: 146/79  Pulse: 82  Height: 5\' 9"  (1.753 m)  Weight: 220 lb 8 oz (100.018 kg)  SpO2: 98%     ASSESSMENT & PLAN

## 2013-11-26 DIAGNOSIS — I10 Essential (primary) hypertension: Secondary | ICD-10-CM | POA: Diagnosis not present

## 2013-11-26 DIAGNOSIS — Z1389 Encounter for screening for other disorder: Secondary | ICD-10-CM | POA: Diagnosis not present

## 2013-11-26 DIAGNOSIS — E119 Type 2 diabetes mellitus without complications: Secondary | ICD-10-CM | POA: Diagnosis not present

## 2013-11-26 DIAGNOSIS — Z0000 Encounter for general adult medical examination without abnormal findings: Secondary | ICD-10-CM | POA: Diagnosis not present

## 2013-11-26 DIAGNOSIS — Z23 Encounter for immunization: Secondary | ICD-10-CM | POA: Diagnosis not present

## 2013-11-29 DIAGNOSIS — M7501 Adhesive capsulitis of right shoulder: Secondary | ICD-10-CM | POA: Diagnosis not present

## 2013-11-29 DIAGNOSIS — M25511 Pain in right shoulder: Secondary | ICD-10-CM | POA: Diagnosis not present

## 2013-12-11 DIAGNOSIS — R3915 Urgency of urination: Secondary | ICD-10-CM | POA: Diagnosis not present

## 2013-12-11 DIAGNOSIS — Z87442 Personal history of urinary calculi: Secondary | ICD-10-CM | POA: Diagnosis not present

## 2013-12-11 DIAGNOSIS — R312 Other microscopic hematuria: Secondary | ICD-10-CM | POA: Diagnosis not present

## 2014-01-03 ENCOUNTER — Encounter (HOSPITAL_COMMUNITY): Payer: Self-pay | Admitting: Cardiovascular Disease

## 2014-03-12 DIAGNOSIS — E119 Type 2 diabetes mellitus without complications: Secondary | ICD-10-CM | POA: Diagnosis not present

## 2014-03-12 DIAGNOSIS — Z961 Presence of intraocular lens: Secondary | ICD-10-CM | POA: Diagnosis not present

## 2014-04-02 DIAGNOSIS — Z85828 Personal history of other malignant neoplasm of skin: Secondary | ICD-10-CM | POA: Diagnosis not present

## 2014-04-02 DIAGNOSIS — D485 Neoplasm of uncertain behavior of skin: Secondary | ICD-10-CM | POA: Diagnosis not present

## 2014-04-02 DIAGNOSIS — C4491 Basal cell carcinoma of skin, unspecified: Secondary | ICD-10-CM | POA: Diagnosis not present

## 2014-04-02 DIAGNOSIS — L570 Actinic keratosis: Secondary | ICD-10-CM | POA: Diagnosis not present

## 2014-04-02 DIAGNOSIS — L814 Other melanin hyperpigmentation: Secondary | ICD-10-CM | POA: Diagnosis not present

## 2014-04-02 DIAGNOSIS — L821 Other seborrheic keratosis: Secondary | ICD-10-CM | POA: Diagnosis not present

## 2014-04-29 DIAGNOSIS — C44519 Basal cell carcinoma of skin of other part of trunk: Secondary | ICD-10-CM | POA: Diagnosis not present

## 2014-05-27 ENCOUNTER — Encounter: Payer: Self-pay | Admitting: Cardiology

## 2014-05-27 ENCOUNTER — Ambulatory Visit (INDEPENDENT_AMBULATORY_CARE_PROVIDER_SITE_OTHER): Payer: Medicare Other | Admitting: Cardiology

## 2014-05-27 VITALS — BP 158/82 | HR 83 | Ht 70.0 in | Wt 222.1 lb

## 2014-05-27 DIAGNOSIS — E785 Hyperlipidemia, unspecified: Secondary | ICD-10-CM

## 2014-05-27 DIAGNOSIS — I10 Essential (primary) hypertension: Secondary | ICD-10-CM

## 2014-05-27 DIAGNOSIS — I2510 Atherosclerotic heart disease of native coronary artery without angina pectoris: Secondary | ICD-10-CM

## 2014-05-27 MED ORDER — FUROSEMIDE 20 MG PO TABS: 20.0000 mg | ORAL_TABLET | ORAL | Status: DC | PRN

## 2014-05-27 NOTE — Assessment & Plan Note (Signed)
Coronary disease is stable. He had a nuclear study in February, 2015. This was low risk. There was a very small mildly reversible defect at the apex. There was a fixed defect at the mid/base inferior wall. EF was 53%. He is on appropriate medications. No further workup is needed.

## 2014-05-27 NOTE — Patient Instructions (Signed)
Your physician recommends that you continue on your current medications as directed. Please refer to the Current Medication list given to you today. Your physician recommends that you schedule a follow-up appointment in: 6 months. You will receive a reminder letter in the mail in about 4 months reminding you to call and schedule your appointment. If you don't receive this letter, please contact our office. 

## 2014-05-27 NOTE — Assessment & Plan Note (Signed)
Patient is treated with guideline directed therapy. If his LDL is not at target, the addition of Zetia could be considered.

## 2014-05-27 NOTE — Assessment & Plan Note (Signed)
His blood pressure may be running slightly higher than the optimal numbers. He is on multiple medications. He is going to check his pressure at home several times before he sees his primary physician next week.

## 2014-05-27 NOTE — Progress Notes (Signed)
Cardiology Office Note   Date:  05/27/2014   ID:  Sean Villarreal, DOB 05-26-1940, MRN [id number]  PCP:  Sean Percy, MD  Cardiologist:  Sean Argyle, MD   Chief Complaint  Patient presents with  . Appointment    follow-up coronary artery disease      History of Present Illness: Sean Villarreal is a 74 y.o. male who presents today to follow-up coronary artery disease. He's doing very well. When he had some hematuria at one point, we had to hold his dual antiplatelet therapy. However all of this stabilized. I had cleared him for prostate surgery in October, 2015. However it turned out he did not need prostate surgery. He's doing well. He checks his blood pressure at home. He is on multiple medications for his blood pressure. His pressures at home tend to run between 024 and 097 systolic. This morning in the office his systolic is 353. Also, he follows his weight carefully. If he has a significant weight gain, he takes a small amount of Lasix. This is infrequent.    Past Medical History  Diagnosis Date  . Hypertension   . Coronary artery disease   . Diabetes mellitus without complication   . Dyslipidemia   . Nephrolithiasis   . Hematuria   . Ejection fraction   . Edema   . Shortness of breath     Past Surgical History  Procedure Laterality Date  . Left heart catheterization with coronary angiogram N/A 05/05/2012    Procedure: LEFT HEART CATHETERIZATION WITH CORONARY ANGIOGRAM;  Surgeon: Burnell Blanks, MD;  Location: [facility] CATH LAB;  Service: Cardiovascular;  Laterality: N/A;  . Left heart catheterization with coronary angiogram N/A 05/15/2012    Procedure: LEFT HEART CATHETERIZATION WITH CORONARY ANGIOGRAM;  Surgeon: Burnell Blanks, MD;  Location: [facility] CATH LAB;  Service: Cardiovascular;  Laterality: N/A;    Patient Active Problem List   Diagnosis Date Noted  . Preop cardiovascular exam 11/05/2013  . GERD (gastroesophageal reflux disease) 05/23/2013  .  Ejection fraction   . Edema   . Shortness of breath   . Hematuria   . Nephrolithiasis   . Hypokalemia 05/14/2012  . Dyslipidemia 05/14/2012  . Hypertension   . Coronary artery disease 05/06/2012  . DM (diabetes mellitus) 05/06/2012      Current Outpatient Prescriptions  Medication Sig Dispense Refill  . amLODipine (NORVASC) 10 MG tablet Take 1 tablet (10 mg total) by mouth daily. 30 tablet 3  . aspirin 81 MG tablet Take 81 mg by mouth daily.    [name] atorvastatin (LIPITOR) 80 MG tablet Take 80 mg by mouth at bedtime.    . carvedilol (COREG) 25 MG tablet Take 1 tablet (25 mg total) by mouth 2 (two) times daily with a meal. 60 tablet 11  . clopidogrel (PLAVIX) 75 MG tablet TAKE 1 TABLET BY MOUTH EVERY DAY 90 tablet 3  . furosemide (LASIX) 20 MG tablet Take 20 mg by mouth as needed.    [name] glimepiride (AMARYL) 1 MG tablet Take 1 mg by mouth daily with breakfast.     . isosorbide mononitrate (IMDUR) 30 MG 24 hr tablet Take 1 tablet (30 mg total) by mouth daily. 30 tablet 11  . ketoconazole (NIZORAL) 2 % cream Apply 1 application topically 2 (two) times daily as needed (apply to face as needed (uses during the winter)). For dry skin    . lisinopril (PRINIVIL,ZESTRIL) 40 MG tablet Take 1 tablet (40 mg total) by mouth daily.  30 tablet 3  . metFORMIN (GLUCOPHAGE XR) 500 MG 24 hr tablet Take 1,500 mg by mouth daily with breakfast.     . nitroGLYCERIN (NITROSTAT) 0.4 MG SL tablet Place 1 tablet (0.4 mg total) under the tongue every 5 (five) minutes x 3 doses as needed for chest pain. 30 tablet 3  . potassium chloride SA (K-DUR,KLOR-CON) 20 MEQ tablet Take 1 tablet (20 mEq total) by mouth daily. 30 tablet 11  . tamsulosin (FLOMAX) 0.4 MG CAPS capsule Take 0.4 mg by mouth 2 (two) times daily.      No current facility-administered medications for this visit.    Allergies:   Penicillins and Contrast media    Social History:  The patient  reports that he quit smoking about 25 years ago. His smoking  use included Cigarettes. He started smoking about 32 years ago. He has never used smokeless tobacco. He reports that he drinks alcohol. He reports that he does not use illicit drugs.   Family History:  The patient's family history includes Heart attack in his father; Heart disease in his father and mother; Heart failure in his mother.    ROS:  Please see the history of present illness.   Patient denies fever, chills, headache, sweats, rash, change in vision, change in hearing, chest pain, cough, nausea or vomiting, urinary symptoms. All other systems are reviewed and are negative.     PHYSICAL EXAM: VS:  BP 158/82 mmHg  Pulse 83  Ht 5\' 10"  (1.778 m)  Wt 222 lb 1.9 oz (100.753 kg)  BMI 31.87 kg/m2  SpO2 97% , Patient is oriented to person time and place. Affect is normal. Head is atraumatic. Sclera and conjunctiva are normal. There is no jugulovenous distention. Lungs are clear. Respiratory effort is nonlabored. Cardiac exam reveals an S1 and S2. The abdomen is soft. There is no peripheral edema. There are no musculoskeletal deformities. There are no skin rashes. Neurologic is grossly intact.  EKG:  EKG is done today and reviewed by me. EKG is normal.   Recent Labs: No results found for requested labs within last 365 days.    Lipid Panel    Component Value Date/Time   CHOL 126 05/06/2012 0500   TRIG 104 05/06/2012 0500   HDL 45 05/06/2012 0500   CHOLHDL 2.8 05/06/2012 0500   VLDL 21 05/06/2012 0500   LDLCALC 60 05/06/2012 0500      Wt Readings from Last 3 Encounters:  05/27/14 222 lb 1.9 oz (100.753 kg)  11/05/13 220 lb 8 oz (100.018 kg)  05/23/13 223 lb (101.152 kg)      Current medicines are reviewed  Patient understands his medications.     ASSESSMENT AND PLAN:

## 2014-05-30 DIAGNOSIS — S41119A Laceration without foreign body of unspecified upper arm, initial encounter: Secondary | ICD-10-CM | POA: Diagnosis not present

## 2014-05-30 DIAGNOSIS — Z0131 Encounter for examination of blood pressure with abnormal findings: Secondary | ICD-10-CM | POA: Diagnosis not present

## 2014-05-30 DIAGNOSIS — E119 Type 2 diabetes mellitus without complications: Secondary | ICD-10-CM | POA: Diagnosis not present

## 2014-06-04 DIAGNOSIS — R1030 Lower abdominal pain, unspecified: Secondary | ICD-10-CM | POA: Diagnosis not present

## 2014-06-04 DIAGNOSIS — I10 Essential (primary) hypertension: Secondary | ICD-10-CM | POA: Diagnosis not present

## 2014-06-04 DIAGNOSIS — I2510 Atherosclerotic heart disease of native coronary artery without angina pectoris: Secondary | ICD-10-CM | POA: Diagnosis not present

## 2014-06-04 DIAGNOSIS — E119 Type 2 diabetes mellitus without complications: Secondary | ICD-10-CM | POA: Diagnosis not present

## 2014-06-08 DIAGNOSIS — S41119D Laceration without foreign body of unspecified upper arm, subsequent encounter: Secondary | ICD-10-CM | POA: Diagnosis not present

## 2014-06-14 DIAGNOSIS — R935 Abnormal findings on diagnostic imaging of other abdominal regions, including retroperitoneum: Secondary | ICD-10-CM | POA: Diagnosis not present

## 2014-06-14 DIAGNOSIS — R3915 Urgency of urination: Secondary | ICD-10-CM | POA: Diagnosis not present

## 2014-06-14 DIAGNOSIS — R312 Other microscopic hematuria: Secondary | ICD-10-CM | POA: Diagnosis not present

## 2014-06-14 DIAGNOSIS — N200 Calculus of kidney: Secondary | ICD-10-CM | POA: Diagnosis not present

## 2014-06-14 DIAGNOSIS — Z87442 Personal history of urinary calculi: Secondary | ICD-10-CM | POA: Diagnosis not present

## 2014-06-14 DIAGNOSIS — N202 Calculus of kidney with calculus of ureter: Secondary | ICD-10-CM | POA: Diagnosis not present

## 2014-07-08 DIAGNOSIS — L570 Actinic keratosis: Secondary | ICD-10-CM | POA: Diagnosis not present

## 2014-07-08 DIAGNOSIS — Z08 Encounter for follow-up examination after completed treatment for malignant neoplasm: Secondary | ICD-10-CM | POA: Diagnosis not present

## 2014-07-08 DIAGNOSIS — Z85828 Personal history of other malignant neoplasm of skin: Secondary | ICD-10-CM | POA: Diagnosis not present

## 2014-10-02 DIAGNOSIS — D1800 Hemangioma unspecified site: Secondary | ICD-10-CM | POA: Diagnosis not present

## 2014-10-02 DIAGNOSIS — L821 Other seborrheic keratosis: Secondary | ICD-10-CM | POA: Diagnosis not present

## 2014-10-02 DIAGNOSIS — L570 Actinic keratosis: Secondary | ICD-10-CM | POA: Diagnosis not present

## 2014-10-02 DIAGNOSIS — D229 Melanocytic nevi, unspecified: Secondary | ICD-10-CM | POA: Diagnosis not present

## 2014-11-01 ENCOUNTER — Other Ambulatory Visit: Payer: Self-pay | Admitting: Cardiology

## 2014-11-26 ENCOUNTER — Encounter: Payer: Self-pay | Admitting: Cardiology

## 2014-11-26 ENCOUNTER — Ambulatory Visit (INDEPENDENT_AMBULATORY_CARE_PROVIDER_SITE_OTHER): Payer: Medicare Other | Admitting: Cardiology

## 2014-11-26 VITALS — BP 156/82 | HR 78 | Ht 70.0 in | Wt 225.4 lb

## 2014-11-26 DIAGNOSIS — I10 Essential (primary) hypertension: Secondary | ICD-10-CM | POA: Diagnosis not present

## 2014-11-26 DIAGNOSIS — Z23 Encounter for immunization: Secondary | ICD-10-CM

## 2014-11-26 DIAGNOSIS — I2510 Atherosclerotic heart disease of native coronary artery without angina pectoris: Secondary | ICD-10-CM

## 2014-11-26 DIAGNOSIS — E785 Hyperlipidemia, unspecified: Secondary | ICD-10-CM | POA: Diagnosis not present

## 2014-11-26 DIAGNOSIS — E1159 Type 2 diabetes mellitus with other circulatory complications: Secondary | ICD-10-CM

## 2014-11-26 MED ORDER — CLOPIDOGREL BISULFATE 75 MG PO TABS: 75.0000 mg | ORAL_TABLET | Freq: Every day | ORAL | Status: DC

## 2014-11-26 NOTE — Progress Notes (Signed)
Cardiology Office Note  Date: 11/26/2014   ID: Sean Villarreal, DOB 09/15/1940, MRN [id number]  PCP: Rory Percy, MD  Primary Cardiologist: Rozann Lesches, MD   Chief Complaint  Patient presents with  . Coronary Artery Disease    History of Present Illness: Sean Villarreal is a 74 y.o. male former patient of Dr. Ron Parker, now establishing follow-up with me. This is our first meeting in the office. I reviewed his records and updated the chart. He presents without any complaints of significant angina. He has not required any regular nitroglycerin use, does have intermittent indigestion which she treats with Nexium.  He stays active with ADLs around the house including yard work and raising a garden each summer. He has NYHA class II dyspnea with typical activities.  Follow-up cardiac catheterization done on 05/15/2012 following complex two vessel intervention to the LAD and circumflex earlier that month revealed patent stent sites within the LAD and circumflex, severe stenosis in a small-caliber ramus intermedius that was not favorable for PCI, and moderate distal LAD stenosis. These regions were managed medically.  Myoview study done in February 2015 is outlined below.  He reports no intolerances to Lipitor and follows up with Dr. Nadara Mustard for repeat lab work. He has also continued on aspirin and Plavix , no recent bleeding problems.   Past Medical History  Diagnosis Date  . Essential hypertension   . Coronary artery disease     DES to LAD and circumflex April 2014  . Type 2 diabetes mellitus ([place])   . Dyslipidemia   . Nephrolithiasis   . Hematuria     Past Surgical History  Procedure Laterality Date  . Left heart catheterization with coronary angiogram N/A 05/05/2012    Procedure: LEFT HEART CATHETERIZATION WITH CORONARY ANGIOGRAM;  Surgeon: Burnell Blanks, MD;  Location: [facility] CATH LAB;  Service: Cardiovascular;  Laterality: N/A;  . Left heart catheterization with  coronary angiogram N/A 05/15/2012    Procedure: LEFT HEART CATHETERIZATION WITH CORONARY ANGIOGRAM;  Surgeon: Burnell Blanks, MD;  Location: [facility] CATH LAB;  Service: Cardiovascular;  Laterality: N/A;    Current Outpatient Prescriptions  Medication Sig Dispense Refill  . amLODipine (NORVASC) 10 MG tablet Take 1 tablet (10 mg total) by mouth daily. 30 tablet 3  . aspirin 81 MG tablet Take 81 mg by mouth daily.    [name] atorvastatin (LIPITOR) 80 MG tablet Take 80 mg by mouth at bedtime.    . carvedilol (COREG) 25 MG tablet Take 1 tablet (25 mg total) by mouth 2 (two) times daily with a meal. 60 tablet 11  . clopidogrel (PLAVIX) 75 MG tablet Take 1 tablet (75 mg total) by mouth daily. 90 tablet 3  . furosemide (LASIX) 20 MG tablet Take 1 tablet (20 mg total) by mouth as needed. 30 tablet 11  . glimepiride (AMARYL) 1 MG tablet Take 1 mg by mouth daily with breakfast.     . isosorbide mononitrate (IMDUR) 30 MG 24 hr tablet Take 1 tablet (30 mg total) by mouth daily. 30 tablet 11  . ketoconazole (NIZORAL) 2 % cream Apply 1 application topically 2 (two) times daily as needed (apply to face as needed (uses during the winter)). For dry skin    . lisinopril (PRINIVIL,ZESTRIL) 40 MG tablet Take 1 tablet (40 mg total) by mouth daily. 30 tablet 3  . metFORMIN (GLUCOPHAGE XR) 500 MG 24 hr tablet Take 1,500 mg by mouth daily with breakfast.     . nitroGLYCERIN (NITROSTAT)  0.4 MG SL tablet Place 1 tablet (0.4 mg total) under the tongue every 5 (five) minutes x 3 doses as needed for chest pain. 30 tablet 3  . potassium chloride SA (K-DUR,KLOR-CON) 20 MEQ tablet Take 1 tablet (20 mEq total) by mouth daily. 30 tablet 11  . tamsulosin (FLOMAX) 0.4 MG CAPS capsule Take 0.4 mg by mouth 2 (two) times daily.      No current facility-administered medications for this visit.    Allergies:  Penicillins and Contrast media   Social History: The patient  reports that he quit smoking about 26 years ago. His smoking use  included Cigarettes. He started smoking about 32 years ago. He has never used smokeless tobacco. He reports that he drinks alcohol. He reports that he does not use illicit drugs.   ROS:  Please see the history of present illness. Otherwise, complete review of systems is positive for none.  All other systems are reviewed and negative.   Physical Exam: VS:  BP 156/82 mmHg  Pulse 78  Ht 5\' 10"  (1.778 m)  Wt 225 lb 6.4 oz (102.241 kg)  BMI 32.34 kg/m2  SpO2 98%, BMI Body mass index is 32.34 kg/(m^2).  Wt Readings from Last 3 Encounters:  11/26/14 225 lb 6.4 oz (102.241 kg)  05/27/14 222 lb 1.9 oz (100.753 kg)  11/05/13 220 lb 8 oz (100.018 kg)     General: Overweight male, appears comfortable at rest. HEENT: Conjunctiva and lids normal, oropharynx clear. Neck: Supple, no elevated JVP or carotid bruits, no thyromegaly. Lungs: Clear to auscultation, nonlabored breathing at rest. Cardiac: Regular rate and rhythm, no S3, soft systolic murmur. Abdomen: Soft, nontender, bowel sounds present. Extremities: Trace ankle edema, distal pulses 2+. Skin: Warm and dry. Musculoskeletal: No kyphosis. Neuropsychiatric: Alert and oriented x3, affect grossly appropriate.   ECG: Tracing from 05/27/2014 showed normal sinus rhythm.   Recent Labwork:  February 2015: BUN 15, creatinine 0.7, potassium 3.6  Other Studies Reviewed Today:  Echocardiogram 05/14/2012: Study Conclusions  - Left ventricle: The cavity size was normal. Wall thickness was increased in a pattern of mild LVH. Systolic function was normal. The estimated ejection fraction was in the range of 55% to 60%. Wall motion was normal; there were no regional wall motion abnormalities. Doppler parameters are consistent with abnormal left ventricular relaxation (grade 1 diastolic dysfunction). - Aortic valve: Mildly calcified annulus. Trileaflet; mildly calcified leaflets. - Mitral valve: Calcified annulus. Mild  regurgitation. - Right ventricle: The cavity size was mildly dilated. - Right atrium: The atrium was mildly dilated. - Tricuspid valve: Mild regurgitation. - Pulmonary arteries: PA peak pressure: 61mm Hg (S). - Pericardium, extracardiac: There was no pericardial effusion.  Exercise Myoview 03/21/2013: QPS Raw Data Images: Normal; no motion artifact; normal heart/lung ratio. Stress Images: There is a very small, mild area of attenuation of the apex and a small-medium sized area of mild-moderate attenuation in the basal/mid inferior wall .  Rest Images: There is small-medium sized area of mild - moderate attenuation of the basal and mid inferior wall.  Subtraction (SDS): no significant ischemia. There appears to be a fixed defect in the mid-basal inferior wall.  Transient Ischemic Dilatation (Normal <1.22): 1.03 Lung/Heart Ratio (Normal <0.45): 0.34  Quantitative Gated Spect Images QGS EDV: 124 ml QGS ESV: 58 ml  Impression Exercise Capacity: Fair exercise capacity. BP Response: Normal blood pressure response. Clinical Symptoms: No significant symptoms noted. ECG Impression: No significant ST segment change suggestive of ischemia. Comparison with Prior Nuclear Study: No  images to compare  Overall Impression: Low risk stress nuclear study . There small, mildly reversible defect in the apex and a fixed defect in the mid-basal inferior wall. .  LV Ejection Fraction: 53%. LV Wall Motion: NL LV Function; NL Wall Motion.   Assessment and Plan:  1. Symptomatically stable with history of CAD status post complex DES intervention to the LAD and circumflex in 2014. He has residual disease that is being managed medically, and has had no worsening of symptoms. No changes made to current regimen. Ischemic testing from last year was overall low risk.  2. Hyperlipidemia, continues on Lipitor. Keep follow-up with Dr. Nadara Mustard, [place] for LDL around 70.  3. Essential  hypertension, blood pressure mildly elevated today. Continue Coreg, Norvasc, and lisinopril.  Sodium restriction discussed.  4. Type 2 diabetes mellitus, keep follow with Dr. Nadara Mustard. He continues on oral hypoglycemic agents.  Current medicines were reviewed with the patient today.   Orders Placed This Encounter  Procedures  . Flu Vaccine QUAD 36+ mos IM    Disposition: FU with me in 6 months.   Signed, Satira Sark, MD, [facility] 11/26/2014 1:21 PM    [facility] at [place], [place], [place] [address] Phone: [phone]; Fax: [phone]

## 2014-11-26 NOTE — Patient Instructions (Signed)
Your physician recommends that you continue on your current medications as directed. Please refer to the Current Medication list given to you today. Your physician recommends that you schedule a follow-up appointment in: 6 months. You will receive a reminder letter in the mail in about 4 months reminding you to call and schedule your appointment. If you don't receive this letter, please contact our office. 

## 2014-12-03 DIAGNOSIS — I10 Essential (primary) hypertension: Secondary | ICD-10-CM | POA: Diagnosis not present

## 2014-12-03 DIAGNOSIS — E119 Type 2 diabetes mellitus without complications: Secondary | ICD-10-CM | POA: Diagnosis not present

## 2014-12-03 DIAGNOSIS — I2510 Atherosclerotic heart disease of native coronary artery without angina pectoris: Secondary | ICD-10-CM | POA: Diagnosis not present

## 2014-12-03 DIAGNOSIS — Z0000 Encounter for general adult medical examination without abnormal findings: Secondary | ICD-10-CM | POA: Diagnosis not present

## 2015-03-10 DIAGNOSIS — E119 Type 2 diabetes mellitus without complications: Secondary | ICD-10-CM | POA: Diagnosis not present

## 2015-03-10 DIAGNOSIS — I10 Essential (primary) hypertension: Secondary | ICD-10-CM | POA: Diagnosis not present

## 2015-04-01 DIAGNOSIS — L570 Actinic keratosis: Secondary | ICD-10-CM | POA: Diagnosis not present

## 2015-04-01 DIAGNOSIS — L821 Other seborrheic keratosis: Secondary | ICD-10-CM | POA: Diagnosis not present

## 2015-04-01 DIAGNOSIS — Z85828 Personal history of other malignant neoplasm of skin: Secondary | ICD-10-CM | POA: Diagnosis not present

## 2015-04-01 DIAGNOSIS — Z08 Encounter for follow-up examination after completed treatment for malignant neoplasm: Secondary | ICD-10-CM | POA: Diagnosis not present

## 2015-06-04 DIAGNOSIS — L814 Other melanin hyperpigmentation: Secondary | ICD-10-CM | POA: Diagnosis not present

## 2015-06-04 DIAGNOSIS — L570 Actinic keratosis: Secondary | ICD-10-CM | POA: Diagnosis not present

## 2015-06-05 DIAGNOSIS — I2510 Atherosclerotic heart disease of native coronary artery without angina pectoris: Secondary | ICD-10-CM | POA: Diagnosis not present

## 2015-06-05 DIAGNOSIS — I10 Essential (primary) hypertension: Secondary | ICD-10-CM | POA: Diagnosis not present

## 2015-06-05 DIAGNOSIS — E1165 Type 2 diabetes mellitus with hyperglycemia: Secondary | ICD-10-CM | POA: Diagnosis not present

## 2015-06-10 DIAGNOSIS — Z87442 Personal history of urinary calculi: Secondary | ICD-10-CM | POA: Diagnosis not present

## 2015-06-10 DIAGNOSIS — N200 Calculus of kidney: Secondary | ICD-10-CM | POA: Diagnosis not present

## 2015-06-10 DIAGNOSIS — R3129 Other microscopic hematuria: Secondary | ICD-10-CM | POA: Diagnosis not present

## 2015-06-10 DIAGNOSIS — R3915 Urgency of urination: Secondary | ICD-10-CM | POA: Diagnosis not present

## 2015-06-13 ENCOUNTER — Encounter: Payer: Self-pay | Admitting: Cardiology

## 2015-06-13 ENCOUNTER — Encounter: Payer: Self-pay | Admitting: *Deleted

## 2015-06-13 ENCOUNTER — Ambulatory Visit (INDEPENDENT_AMBULATORY_CARE_PROVIDER_SITE_OTHER): Payer: Medicare Other | Admitting: Cardiology

## 2015-06-13 VITALS — BP 145/75 | HR 80 | Ht 70.0 in | Wt 223.0 lb

## 2015-06-13 DIAGNOSIS — I2510 Atherosclerotic heart disease of native coronary artery without angina pectoris: Secondary | ICD-10-CM | POA: Diagnosis not present

## 2015-06-13 DIAGNOSIS — R600 Localized edema: Secondary | ICD-10-CM | POA: Diagnosis not present

## 2015-06-13 DIAGNOSIS — I10 Essential (primary) hypertension: Secondary | ICD-10-CM | POA: Diagnosis not present

## 2015-06-13 DIAGNOSIS — E785 Hyperlipidemia, unspecified: Secondary | ICD-10-CM | POA: Diagnosis not present

## 2015-06-13 NOTE — Progress Notes (Signed)
Cardiology Office Note  Date: 06/13/2015   ID: Sean Villarreal, DOB 24-Sep-1940, MRN [id number]  PCP: Sean Percy, MD  Primary Cardiologist: Sean Lesches, MD   Chief Complaint  Patient presents with  . Coronary Artery Disease    History of Present Illness: Sean Villarreal is a 75 y.o. male last seen in November 2016. He presents for a routine follow-up visit. Still no significant angina symptoms on medical therapy. He states that he sometimes has leg edema and he uses Lasix occasionally, no more than once every one to 2 weeks. He has not had orthopnea or PND. Last echocardiogram from 2014 is outlined below.  Follow-up cardiac catheterization done on 05/15/2012 following complex two vessel intervention to the LAD and circumflex earlier that month revealed patent stent sites within the LAD and circumflex, severe stenosis in a small-caliber ramus intermedius that was not favorable for PCI, and moderate distal LAD stenosis. These regions were managed medically.  Medications were reviewed. Cardiac regimen includes aspirin, Lipitor, Coreg, Norvasc, Plavix, as needed Lasix, Imdur, lisinopril, and potassium supplements.  I reviewed his ECG today which shows normal sinus rhythm.  Past Medical History  Diagnosis Date  . Essential hypertension   . Coronary artery disease     DES to LAD and circumflex April 2014  . Type 2 diabetes mellitus ([place])   . Dyslipidemia   . Nephrolithiasis   . Hematuria     Past Surgical History  Procedure Laterality Date  . Left heart catheterization with coronary angiogram N/A 05/05/2012    Procedure: LEFT HEART CATHETERIZATION WITH CORONARY ANGIOGRAM;  Surgeon: Sean Blanks, MD;  Location: [facility] CATH LAB;  Service: Cardiovascular;  Laterality: N/A;  . Left heart catheterization with coronary angiogram N/A 05/15/2012    Procedure: LEFT HEART CATHETERIZATION WITH CORONARY ANGIOGRAM;  Surgeon: Sean Blanks, MD;  Location: [facility] CATH LAB;   Service: Cardiovascular;  Laterality: N/A;    Current Outpatient Prescriptions  Medication Sig Dispense Refill  . amLODipine (NORVASC) 10 MG tablet Take 1 tablet (10 mg total) by mouth daily. 30 tablet 3  . aspirin 81 MG tablet Take 81 mg by mouth daily.    [name] atorvastatin (LIPITOR) 80 MG tablet Take 80 mg by mouth at bedtime.    . carvedilol (COREG) 25 MG tablet Take 1 tablet (25 mg total) by mouth 2 (two) times daily with a meal. 60 tablet 11  . clopidogrel (PLAVIX) 75 MG tablet Take 1 tablet (75 mg total) by mouth daily. 90 tablet 3  . furosemide (LASIX) 20 MG tablet Take 1 tablet (20 mg total) by mouth as needed. 30 tablet 11  . glimepiride (AMARYL) 1 MG tablet Take 1 mg by mouth daily with breakfast.     . isosorbide mononitrate (IMDUR) 30 MG 24 hr tablet Take 1 tablet (30 mg total) by mouth daily. 30 tablet 11  . ketoconazole (NIZORAL) 2 % cream Apply 1 application topically 2 (two) times daily as needed (apply to face as needed (uses during the winter)). For dry skin    . lisinopril (PRINIVIL,ZESTRIL) 40 MG tablet Take 1 tablet (40 mg total) by mouth daily. 30 tablet 3  . metFORMIN (GLUCOPHAGE XR) 500 MG 24 hr tablet Take 1,500 mg by mouth daily with breakfast.     . nitroGLYCERIN (NITROSTAT) 0.4 MG SL tablet Place 1 tablet (0.4 mg total) under the tongue every 5 (five) minutes x 3 doses as needed for chest pain. 30 tablet 3  . potassium chloride  SA (K-DUR,KLOR-CON) 20 MEQ tablet Take 1 tablet (20 mEq total) by mouth daily. 30 tablet 11  . tamsulosin (FLOMAX) 0.4 MG CAPS capsule Take 0.4 mg by mouth 2 (two) times daily.      No current facility-administered medications for this visit.   Allergies:  Penicillins and Contrast media   Social History: The patient  reports that he quit smoking about 26 years ago. His smoking use included Cigarettes. He started smoking about 33 years ago. He has never used smokeless tobacco. He reports that he drinks alcohol. He reports that he does not use  illicit drugs.   ROS:  Please see the history of present illness. Otherwise, complete review of systems is positive for occasional leg edema.  All other systems are reviewed and negative.   Physical Exam: VS:  BP 145/75 mmHg  Pulse 80  Ht 5\' 10"  (1.778 m)  Wt 223 lb (101.152 kg)  BMI 32.00 kg/m2  SpO2 98%, BMI Body mass index is 32 kg/(m^2).  Wt Readings from Last 3 Encounters:  06/13/15 223 lb (101.152 kg)  11/26/14 225 lb 6.4 oz (102.241 kg)  05/27/14 222 lb 1.9 oz (100.753 kg)    General: Overweight male, appears comfortable at rest. HEENT: Conjunctiva and lids normal, oropharynx clear. Neck: Supple, no elevated JVP or carotid bruits, no thyromegaly. Lungs: Clear to auscultation, nonlabored breathing at rest. Cardiac: Regular rate and rhythm, no S3, soft systolic murmur. Abdomen: Soft, nontender, bowel sounds present. Extremities: Trace ankle edema, distal pulses 2+. Skin: Warm and dry. Musculoskeletal: No kyphosis. Neuropsychiatric: Alert and oriented x3, affect grossly appropriate.  ECG: I personally reviewed the prior tracing from 05/27/2014 which showed normal sinus rhythm.  Recent Labwork:  May 2017: Hemoglobin A1c 7.3, BUN 11, creatinine 0.7, potassium 3.6, AST 20, ALT 23, cholesterol 121, triglycerides 71, HDL 47, LDL 60  Other Studies Reviewed Today:  Echocardiogram 05/14/2012:  Study Conclusions  - Left ventricle: The cavity size was normal. Wall thickness was increased in a pattern of mild LVH. Systolic function was normal. The estimated ejection fraction was in the range of 55% to 60%. Wall motion was normal; there were no regional wall motion abnormalities. Doppler parameters are consistent with abnormal left ventricular relaxation (grade 1 diastolic dysfunction). - Aortic valve: Mildly calcified annulus. Trileaflet; mildly calcified leaflets. - Mitral valve: Calcified annulus. Mild regurgitation. - Right ventricle: The cavity size was mildly  dilated. - Right atrium: The atrium was mildly dilated. - Tricuspid valve: Mild regurgitation. - Pulmonary arteries: PA peak pressure: 59mm Hg (S). - Pericardium, extracardiac: There was no pericardial effusion.  Exercise Myoview 03/21/2013: QPS Raw Data Images: Normal; no motion artifact; normal heart/lung ratio. Stress Images: There is a very small, mild area of attenuation of the apex and a small-medium sized area of mild-moderate attenuation in the basal/mid inferior wall .  Rest Images: There is small-medium sized area of mild - moderate attenuation of the basal and mid inferior wall.  Subtraction (SDS): no significant ischemia. There appears to be a fixed defect in the mid-basal inferior wall.  Transient Ischemic Dilatation (Normal <1.22): 1.03 Lung/Heart Ratio (Normal <0.45): 0.34  Quantitative Gated Spect Images QGS EDV: 124 ml QGS ESV: 58 ml  Impression Exercise Capacity: Fair exercise capacity. BP Response: Normal blood pressure response. Clinical Symptoms: No significant symptoms noted. ECG Impression: No significant ST segment change suggestive of ischemia. Comparison with Prior Nuclear Study: No images to compare  Overall Impression: Low risk stress nuclear study . There small, mildly reversible  defect in the apex and a fixed defect in the mid-basal inferior wall. .  LV Ejection Fraction: 53%. LV Wall Motion: NL LV Function; NL Wall Motion.  Assessment and Plan:  1. Symptomatically stable CAD status post complex interventions to the LAD and circumflex in 2014. Ischemic workup from 2015 was reassuring. ECG today is normal. Continue observation on present regimen.  2. Intermittent leg edema. We will obtain a follow-up echocardiogram to ensure stability in LVEF, his last assessment was in 2014. Continue use of Lasix as needed. Norvasc could also be contributing.  3. Essential hypertension, no changes made to current regimen.  4.  Hyperlipidemia on statin therapy, recent LDL 60.  Current medicines were reviewed with the patient today.   Orders Placed This Encounter  Procedures  . EKG 12-Lead  . ECHOCARDIOGRAM COMPLETE    Disposition: FU with me in 6 months.   Signed, Satira Sark, MD, [facility] 06/13/2015 1:22 PM    [facility] [place], [place], [place] [address] Phone: [phone]; Fax: [phone]

## 2015-06-13 NOTE — Patient Instructions (Signed)

## 2015-06-17 DIAGNOSIS — Z87442 Personal history of urinary calculi: Secondary | ICD-10-CM | POA: Diagnosis not present

## 2015-06-17 DIAGNOSIS — R3915 Urgency of urination: Secondary | ICD-10-CM | POA: Diagnosis not present

## 2015-06-18 ENCOUNTER — Other Ambulatory Visit: Payer: Self-pay

## 2015-06-18 ENCOUNTER — Ambulatory Visit (INDEPENDENT_AMBULATORY_CARE_PROVIDER_SITE_OTHER): Payer: Medicare Other

## 2015-06-18 DIAGNOSIS — I2510 Atherosclerotic heart disease of native coronary artery without angina pectoris: Secondary | ICD-10-CM | POA: Diagnosis not present

## 2015-06-18 DIAGNOSIS — R600 Localized edema: Secondary | ICD-10-CM | POA: Diagnosis not present

## 2015-06-19 ENCOUNTER — Telehealth: Payer: Self-pay | Admitting: *Deleted

## 2015-06-19 NOTE — Telephone Encounter (Signed)
-----   Message from Satira Sark, MD sent at 06/18/2015  3:32 PM EDT ----- Reviewed report. LVEF remains normal at 60-65% with mild diastolic dysfunction. Would continue to use Lasix as needed for leg edema.

## 2015-06-27 NOTE — Telephone Encounter (Signed)
Patient informed. 

## 2015-10-14 DIAGNOSIS — Z08 Encounter for follow-up examination after completed treatment for malignant neoplasm: Secondary | ICD-10-CM | POA: Diagnosis not present

## 2015-10-14 DIAGNOSIS — Z85828 Personal history of other malignant neoplasm of skin: Secondary | ICD-10-CM | POA: Diagnosis not present

## 2015-10-14 DIAGNOSIS — L570 Actinic keratosis: Secondary | ICD-10-CM | POA: Diagnosis not present

## 2015-10-14 DIAGNOSIS — L821 Other seborrheic keratosis: Secondary | ICD-10-CM | POA: Diagnosis not present

## 2015-10-14 DIAGNOSIS — D229 Melanocytic nevi, unspecified: Secondary | ICD-10-CM | POA: Diagnosis not present

## 2015-11-02 ENCOUNTER — Other Ambulatory Visit: Payer: Self-pay | Admitting: Cardiology

## 2015-12-02 DIAGNOSIS — E1165 Type 2 diabetes mellitus with hyperglycemia: Secondary | ICD-10-CM | POA: Diagnosis not present

## 2015-12-02 DIAGNOSIS — I10 Essential (primary) hypertension: Secondary | ICD-10-CM | POA: Diagnosis not present

## 2015-12-02 DIAGNOSIS — I2510 Atherosclerotic heart disease of native coronary artery without angina pectoris: Secondary | ICD-10-CM | POA: Diagnosis not present

## 2015-12-02 DIAGNOSIS — E7800 Pure hypercholesterolemia, unspecified: Secondary | ICD-10-CM | POA: Diagnosis not present

## 2015-12-05 DIAGNOSIS — R109 Unspecified abdominal pain: Secondary | ICD-10-CM | POA: Diagnosis not present

## 2015-12-05 DIAGNOSIS — Z23 Encounter for immunization: Secondary | ICD-10-CM | POA: Diagnosis not present

## 2015-12-05 DIAGNOSIS — Z0000 Encounter for general adult medical examination without abnormal findings: Secondary | ICD-10-CM | POA: Diagnosis not present

## 2015-12-05 DIAGNOSIS — Z1212 Encounter for screening for malignant neoplasm of rectum: Secondary | ICD-10-CM | POA: Diagnosis not present

## 2015-12-05 DIAGNOSIS — Z6833 Body mass index (BMI) 33.0-33.9, adult: Secondary | ICD-10-CM | POA: Diagnosis not present

## 2015-12-23 DIAGNOSIS — E7800 Pure hypercholesterolemia, unspecified: Secondary | ICD-10-CM | POA: Diagnosis not present

## 2015-12-23 DIAGNOSIS — I10 Essential (primary) hypertension: Secondary | ICD-10-CM | POA: Diagnosis not present

## 2015-12-23 DIAGNOSIS — Z6832 Body mass index (BMI) 32.0-32.9, adult: Secondary | ICD-10-CM | POA: Diagnosis not present

## 2015-12-23 DIAGNOSIS — E1165 Type 2 diabetes mellitus with hyperglycemia: Secondary | ICD-10-CM | POA: Diagnosis not present

## 2015-12-23 DIAGNOSIS — R600 Localized edema: Secondary | ICD-10-CM | POA: Diagnosis not present

## 2015-12-24 DIAGNOSIS — R600 Localized edema: Secondary | ICD-10-CM | POA: Diagnosis not present

## 2015-12-24 DIAGNOSIS — M79604 Pain in right leg: Secondary | ICD-10-CM | POA: Diagnosis not present

## 2015-12-24 DIAGNOSIS — M7989 Other specified soft tissue disorders: Secondary | ICD-10-CM | POA: Diagnosis not present

## 2016-01-07 NOTE — Progress Notes (Signed)
Cardiology Office Note  Date: 01/09/2016   ID: CHRISTERPHER KISHI, DOB [birth year], MRN [id number]  PCP: Rory Percy, MD  Primary Cardiologist: Rozann Lesches, MD   Chief Complaint  Patient presents with  . Coronary Artery Disease    History of Present Illness: Sean Villarreal is a 75 y.o. male last seen in May. He presents for a routine follow-up visit. Since last encounter he has not experienced any significant angina symptoms. He did report having a worsening in right ankle edema that was associated with pain, sounds almost arthritic in nature. He did take his Lasix more regularly at that time which also helped.  We went over his medications which are outlined below. He has had no significant changes.  Ischemic testing from 2015 is outlined below. He had an echocardiogram done earlier this year showing normal LVEF and mild diastolic dysfunction.  Past Medical History:  Diagnosis Date  . Coronary artery disease    DES to LAD and circumflex April 2014  . Dyslipidemia   . Essential hypertension   . Hematuria   . Nephrolithiasis   . Type 2 diabetes mellitus ([place])     Past Surgical History:  Procedure Laterality Date  . LEFT HEART CATHETERIZATION WITH CORONARY ANGIOGRAM N/A 05/05/2012   Procedure: LEFT HEART CATHETERIZATION WITH CORONARY ANGIOGRAM;  Surgeon: Burnell Blanks, MD;  Location: [facility] CATH LAB;  Service: Cardiovascular;  Laterality: N/A;  . LEFT HEART CATHETERIZATION WITH CORONARY ANGIOGRAM N/A 05/15/2012   Procedure: LEFT HEART CATHETERIZATION WITH CORONARY ANGIOGRAM;  Surgeon: Burnell Blanks, MD;  Location: [facility] CATH LAB;  Service: Cardiovascular;  Laterality: N/A;    Current Outpatient Prescriptions  Medication Sig Dispense Refill  . amLODipine (NORVASC) 10 MG tablet Take 1 tablet (10 mg total) by mouth daily. 30 tablet 3  . aspirin 81 MG tablet Take 81 mg by mouth daily.    [name] atorvastatin (LIPITOR) 80 MG tablet Take 80 mg by mouth at bedtime.      . carvedilol (COREG) 25 MG tablet Take 1 tablet (25 mg total) by mouth 2 (two) times daily with a meal. 60 tablet 11  . clopidogrel (PLAVIX) 75 MG tablet TAKE 1 TABLET BY MOUTH  DAILY 90 tablet 1  . furosemide (LASIX) 20 MG tablet Take 1 tablet (20 mg total) by mouth every other day. 45 tablet 3  . glimepiride (AMARYL) 1 MG tablet Take 1 mg by mouth daily with breakfast.     . isosorbide mononitrate (IMDUR) 30 MG 24 hr tablet Take 1 tablet (30 mg total) by mouth daily. 30 tablet 11  . ketoconazole (NIZORAL) 2 % cream Apply 1 application topically 2 (two) times daily as needed (apply to face as needed (uses during the winter)). For dry skin    . lisinopril (PRINIVIL,ZESTRIL) 40 MG tablet Take 1 tablet (40 mg total) by mouth daily. 30 tablet 3  . metFORMIN (GLUCOPHAGE) 1000 MG tablet Take 1 tablet by mouth 2 (two) times daily.    . nitroGLYCERIN (NITROSTAT) 0.4 MG SL tablet Place 1 tablet (0.4 mg total) under the tongue every 5 (five) minutes x 3 doses as needed for chest pain. 30 tablet 3  . potassium chloride SA (K-DUR,KLOR-CON) 20 MEQ tablet Take 1 tablet (20 mEq total) by mouth daily. 30 tablet 11  . tamsulosin (FLOMAX) 0.4 MG CAPS capsule Take 0.4 mg by mouth 2 (two) times daily.      No current facility-administered medications for this visit.    Allergies:  Penicillins and Contrast media [iodinated diagnostic agents]   Social History: The patient  reports that he quit smoking about 27 years ago. His smoking use included Cigarettes. He started smoking about 33 years ago. He has never used smokeless tobacco. He reports that he drinks alcohol. He reports that he does not use drugs.   ROS:  Please see the history of present illness. Otherwise, complete review of systems is positive for arthritic stiffness.  All other systems are reviewed and negative.   Physical Exam: VS:  BP 131/74 (BP Location: Left Arm, Cuff Size: Large)   Pulse 82   Ht 5\' 10"  (1.778 m)   Wt 221 lb (100.2 kg)   SpO2  97% Comment: on room air  BMI 31.71 kg/m , BMI Body mass index is 31.71 kg/m.  Wt Readings from Last 3 Encounters:  01/09/16 221 lb (100.2 kg)  06/13/15 223 lb (101.2 kg)  11/26/14 225 lb 6.4 oz (102.2 kg)    General: Overweight male, appears comfortable at rest. HEENT: Conjunctiva and lids normal, oropharynx clear. Neck: Supple, no elevated JVP or carotid bruits, no thyromegaly. Lungs: Clear to auscultation, nonlabored breathing at rest. Cardiac: Regular rate and rhythm, no S3, soft systolic murmur. Abdomen: Soft, nontender, bowel sounds present. Extremities: Trace ankle edema, distal pulses 2+. Skin: Warm and dry. Musculoskeletal: No kyphosis. Neuropsychiatric: Alert and oriented x3, affect grossly appropriate.  ECG: I personally reviewed the tracing from 06/13/2015 which showed normal sinus rhythm.  Recent Labwork:  May 2017: Hemoglobin A1c 7.3, BUN 11, current 0.8, potassium 3.6, AST 20, ALT 23, cholesterol 121, triglycerides 71, HDL 47, LDL 60  Other Studies Reviewed Today:  Echocardiogram 06/18/2015: Study Conclusions  - Left ventricle: The cavity size was normal. Wall thickness was   increased in a pattern of moderate LVH. Systolic function was   normal. The estimated ejection fraction was in the range of 60%   to 65%. Wall motion was normal; there were no regional wall   motion abnormalities. Doppler parameters are consistent with   abnormal left ventricular relaxation (grade 1 diastolic   dysfunction). - Aortic valve: Mildly calcified annulus. Trileaflet; moderately   thickened leaflets. There was mild regurgitation. Valve area   (VTI): 2.32 cm^2. Valve area (Vmax): 2.01 cm^2. Valve area   (Vmean): 1.9 cm^2. - Mitral valve: Moderately calcified annulus. Mildly thickened   leaflets . - Technically adequate study.  Exercise Myoview 03/21/2013: QPS Raw Data Images: Normal; no motion artifact; normal heart/lung ratio. Stress Images: There is a very small, mild  area of attenuation of the apex and a small-medium sized area of mild-moderate attenuation in the basal/mid inferior wall .  Rest Images: There is small-medium sized area of mild - moderate attenuation of the basal and mid inferior wall.  Subtraction (SDS): no significant ischemia. There appears to be a fixed defect in the mid-basal inferior wall.  Transient Ischemic Dilatation (Normal <1.22): 1.03 Lung/Heart Ratio (Normal <0.45): 0.34  Quantitative Gated Spect Images QGS EDV: 124 ml QGS ESV: 58 ml  Impression Exercise Capacity: Fair exercise capacity. BP Response: Normal blood pressure response. Clinical Symptoms: No significant symptoms noted. ECG Impression: No significant ST segment change suggestive of ischemia. Comparison with Prior Nuclear Study: No images to compare  Overall Impression: Low risk stress nuclear study . There small, mildly reversible defect in the apex and a fixed defect in the mid-basal inferior wall. .  LV Ejection Fraction: 53%. LV Wall Motion: NL LV Function; NL Wall Motion.  Assessment and  Plan:  1. CAD status post DES to the LAD and circumflex in 2014. He does not report any angina symptoms on medical therapy and had a low risk Myoview in 2015. We will continue with observation.  2. Intermittent ankle edema. We have decided to go to Lasix 20 mg every other day with potassium supplements for now.  3. Essential hypertension, Coreg, Norvasc, and lisinopril.  4. Hyperlipidemia, on Lipitor with LDL 60.  Current medicines were reviewed with the patient today.  Disposition: Follow-up in 6 months.  Signed, Satira Sark, MD, [facility] 01/09/2016 9:20 AM    [place] at [address], [place], [place] [address] Phone: [phone]; Fax: [phone]

## 2016-01-09 ENCOUNTER — Ambulatory Visit (INDEPENDENT_AMBULATORY_CARE_PROVIDER_SITE_OTHER): Payer: Medicare Other | Admitting: Cardiology

## 2016-01-09 ENCOUNTER — Encounter: Payer: Self-pay | Admitting: Cardiology

## 2016-01-09 VITALS — BP 131/74 | HR 82 | Ht 70.0 in | Wt 221.0 lb

## 2016-01-09 DIAGNOSIS — E785 Hyperlipidemia, unspecified: Secondary | ICD-10-CM | POA: Diagnosis not present

## 2016-01-09 DIAGNOSIS — I2510 Atherosclerotic heart disease of native coronary artery without angina pectoris: Secondary | ICD-10-CM | POA: Diagnosis not present

## 2016-01-09 DIAGNOSIS — R600 Localized edema: Secondary | ICD-10-CM | POA: Diagnosis not present

## 2016-01-09 DIAGNOSIS — I10 Essential (primary) hypertension: Secondary | ICD-10-CM

## 2016-01-09 MED ORDER — FUROSEMIDE 20 MG PO TABS: 20.0000 mg | ORAL_TABLET | ORAL | 3 refills | Status: DC

## 2016-01-09 NOTE — Patient Instructions (Signed)
Medication Instructions:   Lasix 20mg  refilled today, taking every other day.  Continue all other medications.    Labwork: none  Testing/Procedures: none  Follow-Up: Your physician wants you to follow up in: 6 months.  You will receive a reminder letter in the mail one-two months in advance.  If you don't receive a letter, please call our office to schedule the follow up appointment   Any Other Special Instructions Will Be Listed Below (If Applicable).  If you need a refill on your cardiac medications before your next appointment, please call your pharmacy.

## 2016-01-15 ENCOUNTER — Encounter (INDEPENDENT_AMBULATORY_CARE_PROVIDER_SITE_OTHER): Payer: Self-pay | Admitting: Internal Medicine

## 2016-02-12 ENCOUNTER — Ambulatory Visit (INDEPENDENT_AMBULATORY_CARE_PROVIDER_SITE_OTHER): Payer: Medicare Other | Admitting: Internal Medicine

## 2016-02-17 DIAGNOSIS — H353131 Nonexudative age-related macular degeneration, bilateral, early dry stage: Secondary | ICD-10-CM | POA: Diagnosis not present

## 2016-02-17 DIAGNOSIS — H52209 Unspecified astigmatism, unspecified eye: Secondary | ICD-10-CM | POA: Diagnosis not present

## 2016-02-17 DIAGNOSIS — H26492 Other secondary cataract, left eye: Secondary | ICD-10-CM | POA: Diagnosis not present

## 2016-02-17 DIAGNOSIS — E119 Type 2 diabetes mellitus without complications: Secondary | ICD-10-CM | POA: Diagnosis not present

## 2016-02-18 ENCOUNTER — Other Ambulatory Visit: Payer: Self-pay | Admitting: Cardiology

## 2016-02-23 ENCOUNTER — Encounter (INDEPENDENT_AMBULATORY_CARE_PROVIDER_SITE_OTHER): Payer: Self-pay | Admitting: Internal Medicine

## 2016-02-23 ENCOUNTER — Other Ambulatory Visit (INDEPENDENT_AMBULATORY_CARE_PROVIDER_SITE_OTHER): Payer: Self-pay | Admitting: Internal Medicine

## 2016-02-23 ENCOUNTER — Encounter (INDEPENDENT_AMBULATORY_CARE_PROVIDER_SITE_OTHER): Payer: Self-pay | Admitting: *Deleted

## 2016-02-23 ENCOUNTER — Telehealth (INDEPENDENT_AMBULATORY_CARE_PROVIDER_SITE_OTHER): Payer: Self-pay | Admitting: *Deleted

## 2016-02-23 ENCOUNTER — Ambulatory Visit (INDEPENDENT_AMBULATORY_CARE_PROVIDER_SITE_OTHER): Payer: Medicare Other | Admitting: Internal Medicine

## 2016-02-23 VITALS — BP 152/92 | HR 84 | Temp 97.5°F | Ht 70.0 in | Wt 233.7 lb

## 2016-02-23 DIAGNOSIS — R195 Other fecal abnormalities: Secondary | ICD-10-CM

## 2016-02-23 MED ORDER — PEG 3350-KCL-NA BICARB-NACL 420 G PO SOLR: 4000.0000 mL | Freq: Once | ORAL | 0 refills | Status: AC

## 2016-02-23 NOTE — Telephone Encounter (Signed)
Patient aware.

## 2016-02-23 NOTE — Progress Notes (Addendum)
Subjective:    Patient ID: Sean Villarreal, male    DOB: [birth year], 76 y.o.   MRN: [id number]  HPI Referred bu Dr Nadara Mustard for positive Hemosure in November.He says he has not seen any blood. There has been no change in his stool. Sometimes he has LLQ pain and is relieved with defecation. His last colonoscopy he thinks was   2013 in [place] which was normal.  His first colonoscopy revealed a polyp per patient. Usually has colonoscopy every 5 yrs per patient.. Patient is requesting a colonoscopy today. He is concerned about the positive stool card and the pain he has sometimes in his LLQ. His appetite is good. No weight loss. He has a BM daily. Sometimes he may have two stools a da.  No melena or BRRB  No family hx of colon cancer. 12/02/2015 H and H 14.5 and 42.0. MCV 91.  Diabetic since [phone] Hx of cardiac stents in 2014 and maintained on Plavix Retired from [employer].   09/20/2011 Colonoscopy to cecum:  Hx of polyp. Impresson: Normal. Next colonoscopy 5 ys.  Review of Systems Past Medical History:  Diagnosis Date  . Coronary artery disease    DES to LAD and circumflex April 2014  . Dyslipidemia   . Essential hypertension   . Hematuria   . Nephrolithiasis   . Type 2 diabetes mellitus ([place])     Past Surgical History:  Procedure Laterality Date  . LEFT HEART CATHETERIZATION WITH CORONARY ANGIOGRAM N/A 05/05/2012   Procedure: LEFT HEART CATHETERIZATION WITH CORONARY ANGIOGRAM;  Surgeon: Burnell Blanks, MD;  Location: [facility] CATH LAB;  Service: Cardiovascular;  Laterality: N/A;  . LEFT HEART CATHETERIZATION WITH CORONARY ANGIOGRAM N/A 05/15/2012   Procedure: LEFT HEART CATHETERIZATION WITH CORONARY ANGIOGRAM;  Surgeon: Burnell Blanks, MD;  Location: [facility] CATH LAB;  Service: Cardiovascular;  Laterality: N/A;    Allergies  Allergen Reactions  . Penicillins     Passed out as a child  . Contrast Media [Iodinated Diagnostic Agents]     Had "welts" on arm, and kidney  issues after given dye in the past    Current Outpatient Prescriptions on File Prior to Visit  Medication Sig Dispense Refill  . amLODipine (NORVASC) 10 MG tablet Take 1 tablet (10 mg total) by mouth daily. 30 tablet 3  . aspirin 81 MG tablet Take 81 mg by mouth daily.    [name] atorvastatin (LIPITOR) 80 MG tablet Take 80 mg by mouth at bedtime.    . carvedilol (COREG) 25 MG tablet Take 1 tablet (25 mg total) by mouth 2 (two) times daily with a meal. 60 tablet 11  . clopidogrel (PLAVIX) 75 MG tablet TAKE 1 TABLET BY MOUTH  DAILY 90 tablet 1  . furosemide (LASIX) 20 MG tablet Take 1 tablet (20 mg total) by mouth every other day. 45 tablet 3  . glimepiride (AMARYL) 1 MG tablet Take 1 mg by mouth daily with breakfast.     . isosorbide mononitrate (IMDUR) 30 MG 24 hr tablet Take 1 tablet (30 mg total) by mouth daily. 30 tablet 11  . ketoconazole (NIZORAL) 2 % cream Apply 1 application topically 2 (two) times daily as needed (apply to face as needed (uses during the winter)). For dry skin    . lisinopril (PRINIVIL,ZESTRIL) 40 MG tablet Take 1 tablet (40 mg total) by mouth daily. 30 tablet 3  . metFORMIN (GLUCOPHAGE) 1000 MG tablet Take 1 tablet by mouth 2 (two) times daily.    [name]  potassium chloride SA (K-DUR,KLOR-CON) 20 MEQ tablet Take 1 tablet (20 mEq total) by mouth daily. 30 tablet 11  . tamsulosin (FLOMAX) 0.4 MG CAPS capsule Take 0.4 mg by mouth 2 (two) times daily.     . nitroGLYCERIN (NITROSTAT) 0.4 MG SL tablet Place 1 tablet (0.4 mg total) under the tongue every 5 (five) minutes x 3 doses as needed for chest pain. (Patient not taking: Reported on 02/23/2016) 30 tablet 3   No current facility-administered medications on file prior to visit.        Objective:   Physical Exam Blood pressure (!) 152/92, pulse 84, temperature 97.5 F (36.4 C), height 5\' 10"  (1.778 m), weight 233 lb 11.2 oz (106 kg).  Alert and oriented. Skin warm and dry. Oral mucosa is moist.   . Sclera anicteric, conjunctivae  is pink. Thyroid not enlarged. No cervical lymphadenopathy. Lungs clear. Heart regular rate and rhythm.  Abdomen is soft. Bowel sounds are positive. No hepatomegaly. No abdominal masses felt. No tenderness.  No edema to lower extremities.         Assessment & Plan:  Guaiac positive stool. Colonic neoplasm, polyp, AVM, needs to be ruled out. Patient would also like to proceed with a colonoscopy. Colonoscopy. The risks and benefits such as perforation, bleeding, and infection were reviewed with the patient and is agreeable.

## 2016-02-23 NOTE — Telephone Encounter (Signed)
Yes, no specific cardiac contraindication to hold these medications temporarily as requested.

## 2016-02-23 NOTE — Telephone Encounter (Signed)
Patient is scheduled for colonoscopy 03/19/16 and needs to stop Plavix 5 days and ASA 2 days prior, please advise if this is ok, thanks

## 2016-02-23 NOTE — Telephone Encounter (Signed)
Patient needs trilyte 

## 2016-02-23 NOTE — Patient Instructions (Signed)
Colonoscopy.  The risks and benefits such as perforation, bleeding, and infection were reviewed with the patient and is agreeable. 

## 2016-03-08 ENCOUNTER — Encounter (INDEPENDENT_AMBULATORY_CARE_PROVIDER_SITE_OTHER): Payer: Medicare Other | Admitting: Ophthalmology

## 2016-03-08 DIAGNOSIS — H26493 Other secondary cataract, bilateral: Secondary | ICD-10-CM | POA: Diagnosis not present

## 2016-03-08 DIAGNOSIS — H353131 Nonexudative age-related macular degeneration, bilateral, early dry stage: Secondary | ICD-10-CM

## 2016-03-08 DIAGNOSIS — H43813 Vitreous degeneration, bilateral: Secondary | ICD-10-CM

## 2016-03-08 DIAGNOSIS — H26492 Other secondary cataract, left eye: Secondary | ICD-10-CM | POA: Diagnosis not present

## 2016-03-19 ENCOUNTER — Encounter (HOSPITAL_COMMUNITY): Payer: Self-pay | Admitting: *Deleted

## 2016-03-19 ENCOUNTER — Ambulatory Visit (HOSPITAL_COMMUNITY)
Admission: RE | Admit: 2016-03-19 | Discharge: 2016-03-19 | Disposition: A | Discharge status: Home / Self Care | Payer: Medicare Other | Source: Ambulatory Visit | Attending: Internal Medicine | Admitting: Internal Medicine

## 2016-03-19 ENCOUNTER — Encounter (HOSPITAL_COMMUNITY)
Admission: RE | Disposition: A | Discharge status: Home / Self Care | Payer: Self-pay | Source: Ambulatory Visit | Attending: Internal Medicine

## 2016-03-19 DIAGNOSIS — Z86010 Personal history of colonic polyps: Secondary | ICD-10-CM | POA: Diagnosis not present

## 2016-03-19 DIAGNOSIS — Z79899 Other long term (current) drug therapy: Secondary | ICD-10-CM | POA: Insufficient documentation

## 2016-03-19 DIAGNOSIS — E119 Type 2 diabetes mellitus without complications: Secondary | ICD-10-CM | POA: Diagnosis not present

## 2016-03-19 DIAGNOSIS — R195 Other fecal abnormalities: Secondary | ICD-10-CM | POA: Diagnosis not present

## 2016-03-19 DIAGNOSIS — I10 Essential (primary) hypertension: Secondary | ICD-10-CM | POA: Insufficient documentation

## 2016-03-19 DIAGNOSIS — I2510 Atherosclerotic heart disease of native coronary artery without angina pectoris: Secondary | ICD-10-CM | POA: Diagnosis not present

## 2016-03-19 DIAGNOSIS — Z7902 Long term (current) use of antithrombotics/antiplatelets: Secondary | ICD-10-CM | POA: Diagnosis not present

## 2016-03-19 DIAGNOSIS — Z880 Allergy status to penicillin: Secondary | ICD-10-CM | POA: Insufficient documentation

## 2016-03-19 DIAGNOSIS — K6289 Other specified diseases of anus and rectum: Secondary | ICD-10-CM | POA: Insufficient documentation

## 2016-03-19 DIAGNOSIS — K644 Residual hemorrhoidal skin tags: Secondary | ICD-10-CM | POA: Insufficient documentation

## 2016-03-19 DIAGNOSIS — Z87891 Personal history of nicotine dependence: Secondary | ICD-10-CM | POA: Diagnosis not present

## 2016-03-19 DIAGNOSIS — Z7982 Long term (current) use of aspirin: Secondary | ICD-10-CM | POA: Diagnosis not present

## 2016-03-19 DIAGNOSIS — K5730 Diverticulosis of large intestine without perforation or abscess without bleeding: Secondary | ICD-10-CM | POA: Diagnosis not present

## 2016-03-19 DIAGNOSIS — Z7984 Long term (current) use of oral hypoglycemic drugs: Secondary | ICD-10-CM | POA: Insufficient documentation

## 2016-03-19 DIAGNOSIS — K921 Melena: Secondary | ICD-10-CM | POA: Insufficient documentation

## 2016-03-19 DIAGNOSIS — E785 Hyperlipidemia, unspecified: Secondary | ICD-10-CM | POA: Diagnosis not present

## 2016-03-19 HISTORY — PX: COLONOSCOPY: SHX5424

## 2016-03-19 LAB — GLUCOSE, CAPILLARY: Glucose-Capillary: 146 mg/dL — ABNORMAL HIGH (ref 65–99)

## 2016-03-19 SURGERY — COLONOSCOPY
Anesthesia: Moderate Sedation

## 2016-03-19 MED ORDER — STERILE WATER FOR IRRIGATION IR SOLN
Status: DC | PRN
  Administered 2016-03-19: 12:00:00

## 2016-03-19 MED ORDER — MIDAZOLAM HCL 5 MG/5ML IJ SOLN
INTRAMUSCULAR | Status: AC
  Filled 2016-03-19: qty 10

## 2016-03-19 MED ORDER — MEPERIDINE HCL 50 MG/ML IJ SOLN
INTRAMUSCULAR | Status: DC | PRN
  Administered 2016-03-19 (×2): 25 mg via INTRAVENOUS

## 2016-03-19 MED ORDER — MEPERIDINE HCL 50 MG/ML IJ SOLN
INTRAMUSCULAR | Status: AC
  Filled 2016-03-19: qty 1

## 2016-03-19 MED ORDER — PSYLLIUM 28 % PO PACK: 1.0000 | PACK | Freq: Every day | ORAL | Status: DC

## 2016-03-19 MED ORDER — MIDAZOLAM HCL 5 MG/5ML IJ SOLN
INTRAMUSCULAR | Status: DC | PRN
  Administered 2016-03-19 (×3): 1 mg via INTRAVENOUS
  Administered 2016-03-19 (×2): 2 mg via INTRAVENOUS

## 2016-03-19 MED ORDER — SODIUM CHLORIDE 0.9 % IV SOLN
INTRAVENOUS | Status: DC
  Administered 2016-03-19: 11:00:00 via INTRAVENOUS

## 2016-03-19 MED ORDER — SIMETHICONE 40 MG/0.6ML PO SUSP
ORAL | Status: AC
  Filled 2016-03-19: qty 30

## 2016-03-19 NOTE — Op Note (Signed)
[facility] Patient Name: Sean Villarreal Procedure Date: 03/19/2016 11:42 AM MRN: [id number] Date of Birth: 09/29/1940 Attending MD: Hildred Laser , MD CSN: [id number] Age: 76 Admit Type: Outpatient Procedure:                Colonoscopy Indications:              Heme positive stool. He has history of colonic                            polyps. Last colonoscopy August 2013. Providers:                Hildred Laser, MD, Jeanann Lewandowsky. Sharon Seller, RN, Sherlyn Lees, Technician Referring MD:             Rory Percy, MD Medicines:                Meperidine 50 mg IV, Midazolam 7 mg IV Complications:            No immediate complications. Estimated Blood Loss:     Estimated blood loss: none. Procedure:                Pre-Anesthesia Assessment:                           - Prior to the procedure, a History and Physical                            was performed, and patient medications and                            allergies were reviewed. The patient's tolerance of                            previous anesthesia was also reviewed. The risks                            and benefits of the procedure and the sedation                            options and risks were discussed with the patient.                            All questions were answered, and informed consent                            was obtained. Prior Anticoagulants: The patient                            last took aspirin 2 days and Plavix (clopidogrel) 5                            days prior to the procedure. ASA Grade Assessment:  II - A patient with mild systemic disease. After                            reviewing the risks and benefits, the patient was                            deemed in satisfactory condition to undergo the                            procedure.                           After obtaining informed consent, the colonoscope                            was passed  under direct vision. Throughout the                            procedure, the patient's blood pressure, pulse, and                            oxygen saturations were monitored continuously. The                            [id number]) scope was introduced through                            the anus and advanced to the the cecum, identified                            by appendiceal orifice and ileocecal valve. The                            colonoscopy was performed without difficulty. The                            patient tolerated the procedure well. The quality                            of the bowel preparation was adequate. The                            ileocecal valve, appendiceal orifice, and rectum                            were photographed. Scope In: 11:51:37 AM Scope Out: 12:09:50 PM Scope Withdrawal Time: 0 hours 12 minutes 28 seconds  Total Procedure Duration: 0 hours 18 minutes 13 seconds  Findings:      The perianal and digital rectal examinations were normal.      A few medium-mouthed diverticula were found in the sigmoid colon and       cecum.      The exam was otherwise normal throughout the examined colon.      External hemorrhoids were found during retroflexion. The hemorrhoids  were small.      Anal papilla(e) were hypertrophied. Impression:               - Diverticulosis in the sigmoid colon and in the                            cecum.                           - External hemorrhoids.                           - Anal papilla(e) were hypertrophied.                           - No specimens collected. Moderate Sedation:      Moderate (conscious) sedation was administered by the endoscopy nurse       and supervised by the endoscopist. The following parameters were       monitored: oxygen saturation, heart rate, blood pressure, CO2       capnography and response to care. Total physician intraservice time was       24 minutes. Recommendation:            - Patient has a contact number available for                            emergencies. The signs and symptoms of potential                            delayed complications were discussed with the                            patient. Return to normal activities tomorrow.                            Written discharge instructions were provided to the                            patient.                           - High fiber diet today.                           - Continue present medications.                           - Resume aspirin today and Plavix (clopidogrel)                            today at prior doses.                           - Repeat colonoscopy in 5 years for surveillance. Procedure Code(s):        --- Professional ---  [phone], Colonoscopy, flexible; diagnostic, including                            collection of specimen(s) by brushing or washing,                            when performed (separate procedure)                           99152, Moderate sedation services provided by the                            same physician or other qualified health care                            professional performing the diagnostic or                            therapeutic service that the sedation supports,                            requiring the presence of an independent trained                            observer to assist in the monitoring of the                            patient's level of consciousness and physiological                            status; initial 15 minutes of intraservice time,                            patient age 44 years or older                           [phone], Moderate sedation services; each additional                            15 minutes intraservice time Diagnosis Code(s):        --- Professional ---                           K64.4, Residual hemorrhoidal skin tags                           K62.89, Other specified diseases of anus and  rectum                           R19.5, Other fecal abnormalities                           K57.30, Diverticulosis of large intestine without  perforation or abscess without bleeding CPT copyright 2016 American [employer]. All rights reserved. The codes documented in this report are preliminary and upon coder review may  be revised to meet current compliance requirements. Hildred Laser, MD Hildred Laser, MD 03/19/2016 12:17:17 PM This report has been signed electronically. Number of Addenda: 0

## 2016-03-19 NOTE — H&P (Signed)
Sean Villarreal is an 76 y.o. male.   Chief Complaint: Patient is here for colonoscopy. HPI: She 76 year old Caucasian male with history of colonic polyps was last exam was normal in August 2013 was noted to have heme-positive stool. He has noted intermittent LLQ abdominal pain and urgency with loose stools but no bleeding or melena reported. Patient is on low-dose aspirin and clopidogrel. He's been off clopidogrel for 5 days and aspirin for 2 days. Family History is negative for CRC.  Past Medical History:  Diagnosis Date  . Coronary artery disease    DES to LAD and circumflex April 2014  . Dyslipidemia   . Essential hypertension   . Hematuria   . Nephrolithiasis   . Type 2 diabetes mellitus ([place])     Past Surgical History:  Procedure Laterality Date  . LEFT HEART CATHETERIZATION WITH CORONARY ANGIOGRAM N/A 05/05/2012   Procedure: LEFT HEART CATHETERIZATION WITH CORONARY ANGIOGRAM;  Surgeon: Burnell Blanks, MD;  Location: [facility] CATH LAB;  Service: Cardiovascular;  Laterality: N/A;  . LEFT HEART CATHETERIZATION WITH CORONARY ANGIOGRAM N/A 05/15/2012   Procedure: LEFT HEART CATHETERIZATION WITH CORONARY ANGIOGRAM;  Surgeon: Burnell Blanks, MD;  Location: [facility] CATH LAB;  Service: Cardiovascular;  Laterality: N/A;    Family History  Problem Relation Age of Onset  . Heart disease Mother   . Heart failure Mother   . Heart disease Father   . Heart attack Father   . Colon cancer Neg Hx    Social History:  reports that he quit smoking about 27 years ago. His smoking use included Cigarettes. He started smoking about 34 years ago. He has never used smokeless tobacco. He reports that he drinks alcohol. He reports that he does not use drugs.  Allergies:  Allergies  Allergen Reactions  . Penicillins Other (See Comments)    Passed out as a child  . Contrast Media [Iodinated Diagnostic Agents] Other (See Comments)    Had "welts" on arm, and kidney issues after given dye in the  past    Medications Prior to Admission  Medication Sig Dispense Refill  . amLODipine (NORVASC) 10 MG tablet Take 1 tablet (10 mg total) by mouth daily. 30 tablet 3  . aspirin 81 MG tablet Take 81 mg by mouth daily.    [name] atorvastatin (LIPITOR) 80 MG tablet Take 80 mg by mouth at bedtime.    . carvedilol (COREG) 25 MG tablet Take 1 tablet (25 mg total) by mouth 2 (two) times daily with a meal. 60 tablet 11  . clopidogrel (PLAVIX) 75 MG tablet TAKE 1 TABLET BY MOUTH  DAILY 90 tablet 1  . esomeprazole (NEXIUM 24HR) 20 MG capsule Take 20 mg by mouth daily as needed (for acid reflux).    . furosemide (LASIX) 20 MG tablet Take 1 tablet (20 mg total) by mouth every other day. 45 tablet 3  . glimepiride (AMARYL) 1 MG tablet Take 1 mg by mouth daily with breakfast.     . isosorbide mononitrate (IMDUR) 30 MG 24 hr tablet Take 1 tablet (30 mg total) by mouth daily. 30 tablet 11  . ketoconazole (NIZORAL) 2 % cream Apply 1 application topically 2 (two) times daily as needed (apply to face as needed (uses during the winter)). For dry skin    . lisinopril (PRINIVIL,ZESTRIL) 40 MG tablet Take 1 tablet (40 mg total) by mouth daily. 30 tablet 3  . metFORMIN (GLUCOPHAGE) 1000 MG tablet Take 1,000 mg by mouth 2 (two) times daily.     [name]  potassium chloride SA (K-DUR,KLOR-CON) 20 MEQ tablet Take 1 tablet (20 mEq total) by mouth daily. 30 tablet 11  . tamsulosin (FLOMAX) 0.4 MG CAPS capsule Take 0.4 mg by mouth 2 (two) times daily.     . nitroGLYCERIN (NITROSTAT) 0.4 MG SL tablet Place 1 tablet (0.4 mg total) under the tongue every 5 (five) minutes x 3 doses as needed for chest pain. 30 tablet 3    Results for orders placed or performed during the hospital encounter of 03/19/16 (from the past 48 hour(s))  Glucose, capillary     Status: Abnormal   Collection Time: 03/19/16 10:29 AM  Result Value Ref Range   Glucose-Capillary 146 (H) 65 - 99 mg/dL   No results found.  ROS  Blood pressure 137/81, pulse 86,  temperature 98.4 F (36.9 C), temperature source Oral, resp. rate 14, height 5\' 10"  (1.778 m), weight 220 lb (99.8 kg), SpO2 97 %. Physical Exam  Constitutional: He appears well-developed and well-nourished.  HENT:  Mouth/Throat: Oropharynx is clear and moist.  Eyes: Conjunctivae are normal. No scleral icterus.  Neck: No thyromegaly present.  Cardiovascular: Normal rate and regular rhythm.   Murmur (faint systolic ejection murmur best heard at left sternal border.) heard. Respiratory: Effort normal and breath sounds normal.  GI: Soft. He exhibits no distension and no mass. There is no tenderness.  Musculoskeletal: He exhibits no edema.  Lymphadenopathy:    He has no cervical adenopathy.  Neurological: He is alert.  Skin: Skin is warm and dry.     Assessment/Plan Heme positive stool. History of colonic polyps. Diagnostic colonoscopy.  Hildred Laser, MD 03/19/2016, 11:37 AM

## 2016-03-19 NOTE — Discharge Instructions (Signed)
Resume usual medications including aspirin and clopidogrel as before. High fiber diet. Metamucil 3-4 g by mouth daily at bedtime. IBgard 1 capsule 2 or 3 times a day as needed for left-sided abdominal pain(OTC). No driving for 24 hours. Next colonoscopy in 5 years.       Colonoscopy, Adult, Care After This sheet gives you information about how to care for yourself after your procedure. Your doctor may also give you more specific instructions. If you have problems or questions, call your doctor. Follow these instructions at home: General instructions  For the first 24 hours after the procedure:  Do not drive or use machinery.  Do not sign important documents.  Do not drink alcohol.  Do your daily activities more slowly than normal.  Eat foods that are soft and easy to digest.  Rest often.  Take over-the-counter or prescription medicines only as told by your doctor.  It is up to you to get the results of your procedure. Ask your doctor, or the department performing the procedure, when your results will be ready. To help cramping and bloating:  Try walking around.  Put heat on your belly (abdomen) as told by your doctor. Use a heat source that your doctor recommends, such as a moist heat pack or a heating pad.  Put a towel between your skin and the heat source.  Leave the heat on for 20-30 minutes.  Remove the heat if your skin turns bright red. This is especially important if you cannot feel pain, heat, or cold. You can get burned. Eating and drinking  Drink enough fluid to keep your pee (urine) clear or pale yellow.  Return to your normal diet as told by your doctor. Avoid heavy or fried foods that are hard to digest.  Avoid drinking alcohol for as long as told by your doctor. Contact a doctor if:  You have blood in your poop (stool) 2-3 days after the procedure. Get help right away if:  You have more than a small amount of blood in your poop.  You see large  clumps of tissue (blood clots) in your poop.  Your belly is swollen.  You feel sick to your stomach (nauseous).  You throw up (vomit).  You have a fever.  You have belly pain that gets worse, and medicine does not help your pain. This information is not intended to replace advice given to you by your health care provider. Make sure you discuss any questions you have with your health care provider. Document Released: 02/13/2010 Document Revised: 10/06/2015 Document Reviewed: 10/06/2015 Elsevier Interactive Patient Education  2017 [place].    High-Fiber Diet Fiber, also called dietary fiber, is a type of carbohydrate found in fruits, vegetables, whole grains, and beans. A high-fiber diet can have many health benefits. Your health care provider may recommend a high-fiber diet to help:  Prevent constipation. Fiber can make your bowel movements more regular.  Lower your cholesterol.  Relieve hemorrhoids, uncomplicated diverticulosis, or irritable bowel syndrome.  Prevent overeating as part of a weight-loss plan.  Prevent heart disease, type 2 diabetes, and certain cancers. What is my plan? The recommended daily intake of fiber includes:  38 grams for men under age 16.  50 grams for men over age 58.  19 grams for women under age 72.  54 grams for women over age 56. You can get the recommended daily intake of dietary fiber by eating a variety of fruits, vegetables, grains, and beans. Your health care provider  may also recommend a fiber supplement if it is not possible to get enough fiber through your diet. What do I need to know about a high-fiber diet?  Fiber supplements have not been widely studied for their effectiveness, so it is better to get fiber through food sources.  Always check the fiber content on thenutrition facts label of any prepackaged food. Look for foods that contain at least 5 grams of fiber per serving.  Ask your dietitian if you have questions about  specific foods that are related to your condition, especially if those foods are not listed in the following section.  Increase your daily fiber consumption gradually. Increasing your intake of dietary fiber too quickly may cause bloating, cramping, or gas.  Drink plenty of water. Water helps you to digest fiber. What foods can I eat? Grains  Whole-grain breads. Multigrain cereal. Oats and oatmeal. Brown rice. Barley. Bulgur wheat. [place]. Bran muffins. Popcorn. Rye wafer crackers. Vegetables  Sweet potatoes. Spinach. Kale. Artichokes. Cabbage. Broccoli. Green peas. Carrots. Squash. Fruits  Berries. Pears. Apples. Oranges. Avocados. Prunes and raisins. Dried figs. Meats and Other Protein Sources  [place], kidney, pinto, and soy beans. Split peas. Lentils. Nuts and seeds. Dairy  Fiber-fortified yogurt. Beverages  Fiber-fortified soy milk. Fiber-fortified orange juice. Other  Fiber bars. The items listed above may not be a complete list of recommended foods or beverages. Contact your dietitian for more options.  What foods are not recommended? Grains  White bread. Pasta made with refined flour. White rice. Vegetables  Fried potatoes. Canned vegetables. Well-cooked vegetables. Fruits  Fruit juice. Cooked, strained fruit. Meats and Other Protein Sources  Fatty cuts of meat. Fried Sales executive or fried fish. Dairy  Milk. Yogurt. Cream cheese. Sour cream. Beverages  Soft drinks. Other  Cakes and pastries. Butter and oils. The items listed above may not be a complete list of foods and beverages to avoid. Contact your dietitian for more information.  What are some tips for including high-fiber foods in my diet?  Eat a wide variety of high-fiber foods.  Make sure that half of all grains consumed each day are whole grains.  Replace breads and cereals made from refined flour or white flour with whole-grain breads and cereals.  Replace white rice with brown rice, bulgur wheat, or  millet.  Start the day with a breakfast that is high in fiber, such as a cereal that contains at least 5 grams of fiber per serving.  Use beans in place of meat in soups, salads, or pasta.  Eat high-fiber snacks, such as berries, raw vegetables, nuts, or popcorn. This information is not intended to replace advice given to you by your health care provider. Make sure you discuss any questions you have with your health care provider. Document Released: 01/11/2005 Document Revised: 06/19/2015 Document Reviewed: 06/26/2013 Elsevier Interactive Patient Education  2017 [employer]    Diverticulosis Diverticulosis is the condition that develops when small pouches (diverticula) form in the wall of your colon. Your colon, or large intestine, is where water is absorbed and stool is formed. The pouches form when the inside layer of your colon pushes through weak spots in the outer layers of your colon. CAUSES  No one knows exactly what causes diverticulosis. RISK FACTORS  Being older than 48. Your risk for this condition increases with age. Diverticulosis is rare in people younger than 40 years. By age 57, almost everyone has it.  Eating a low-fiber diet.  Being frequently constipated.  Being overweight.  Not getting enough exercise.  Smoking.  Taking over-the-counter pain medicines, like aspirin and ibuprofen. SYMPTOMS  Most people with diverticulosis do not have symptoms. DIAGNOSIS  Because diverticulosis often has no symptoms, health care providers often discover the condition during an exam for other colon problems. In many cases, a health care provider will diagnose diverticulosis while using a flexible scope to examine the colon (colonoscopy). TREATMENT  If you have never developed an infection related to diverticulosis, you may not need treatment. If you have had an infection before, treatment may include:  Eating more fruits, vegetables, and grains.  Taking a fiber  supplement.  Taking a live bacteria supplement (probiotic).  Taking medicine to relax your colon. HOME CARE INSTRUCTIONS   Drink at least 6-8 glasses of water each day to prevent constipation.  Try not to strain when you have a bowel movement.  Keep all follow-up appointments. If you have had an infection before:  Increase the fiber in your diet as directed by your health care provider or dietitian.  Take a dietary fiber supplement if your health care provider approves.  Only take medicines as directed by your health care provider. SEEK MEDICAL CARE IF:   You have abdominal pain.  You have bloating.  You have cramps.  You have not gone to the bathroom in 3 days. SEEK IMMEDIATE MEDICAL CARE IF:   Your pain gets worse.  Yourbloating becomes very bad.  You have a fever or chills, and your symptoms suddenly get worse.  You begin vomiting.  You have bowel movements that are bloody or black. MAKE SURE YOU:  Understand these instructions.  Will watch your condition.  Will get help right away if you are not doing well or get worse. This information is not intended to replace advice given to you by your health care provider. Make sure you discuss any questions you have with your health care provider. Document Released: 10/09/2003 Document Revised: 01/16/2013 Document Reviewed: 12/06/2012 Elsevier Interactive Patient Education  2017 [employer].

## 2016-03-23 ENCOUNTER — Encounter (HOSPITAL_COMMUNITY): Payer: Self-pay | Admitting: Internal Medicine

## 2016-03-23 ENCOUNTER — Encounter (INDEPENDENT_AMBULATORY_CARE_PROVIDER_SITE_OTHER): Payer: Self-pay

## 2016-04-12 DIAGNOSIS — D485 Neoplasm of uncertain behavior of skin: Secondary | ICD-10-CM | POA: Diagnosis not present

## 2016-04-12 DIAGNOSIS — L821 Other seborrheic keratosis: Secondary | ICD-10-CM | POA: Diagnosis not present

## 2016-04-12 DIAGNOSIS — Z85828 Personal history of other malignant neoplasm of skin: Secondary | ICD-10-CM | POA: Diagnosis not present

## 2016-04-12 DIAGNOSIS — L570 Actinic keratosis: Secondary | ICD-10-CM | POA: Diagnosis not present

## 2016-04-12 DIAGNOSIS — Z08 Encounter for follow-up examination after completed treatment for malignant neoplasm: Secondary | ICD-10-CM | POA: Diagnosis not present

## 2016-04-15 ENCOUNTER — Observation Stay (HOSPITAL_COMMUNITY)
Admission: EM | Admit: 2016-04-15 | Discharge: 2016-04-16 | Disposition: A | Discharge status: Home / Self Care | Payer: Medicare Other | Attending: Cardiology | Admitting: Cardiology

## 2016-04-15 ENCOUNTER — Encounter (HOSPITAL_COMMUNITY): Payer: Self-pay | Admitting: Emergency Medicine

## 2016-04-15 ENCOUNTER — Encounter (HOSPITAL_COMMUNITY)
Admission: EM | Disposition: A | Discharge status: Home / Self Care | Payer: Self-pay | Source: Home / Self Care | Attending: Emergency Medicine

## 2016-04-15 ENCOUNTER — Emergency Department (HOSPITAL_COMMUNITY): Payer: Medicare Other

## 2016-04-15 DIAGNOSIS — Z6831 Body mass index (BMI) 31.0-31.9, adult: Secondary | ICD-10-CM | POA: Diagnosis not present

## 2016-04-15 DIAGNOSIS — I10 Essential (primary) hypertension: Secondary | ICD-10-CM | POA: Diagnosis present

## 2016-04-15 DIAGNOSIS — K5730 Diverticulosis of large intestine without perforation or abscess without bleeding: Secondary | ICD-10-CM | POA: Diagnosis not present

## 2016-04-15 DIAGNOSIS — R079 Chest pain, unspecified: Secondary | ICD-10-CM | POA: Diagnosis not present

## 2016-04-15 DIAGNOSIS — Z87891 Personal history of nicotine dependence: Secondary | ICD-10-CM | POA: Insufficient documentation

## 2016-04-15 DIAGNOSIS — E669 Obesity, unspecified: Secondary | ICD-10-CM | POA: Diagnosis not present

## 2016-04-15 DIAGNOSIS — I200 Unstable angina: Secondary | ICD-10-CM | POA: Diagnosis not present

## 2016-04-15 DIAGNOSIS — Z8249 Family history of ischemic heart disease and other diseases of the circulatory system: Secondary | ICD-10-CM | POA: Diagnosis not present

## 2016-04-15 DIAGNOSIS — T82855A Stenosis of coronary artery stent, initial encounter: Secondary | ICD-10-CM | POA: Diagnosis not present

## 2016-04-15 DIAGNOSIS — Z7902 Long term (current) use of antithrombotics/antiplatelets: Secondary | ICD-10-CM | POA: Diagnosis not present

## 2016-04-15 DIAGNOSIS — E119 Type 2 diabetes mellitus without complications: Secondary | ICD-10-CM | POA: Diagnosis not present

## 2016-04-15 DIAGNOSIS — Z7982 Long term (current) use of aspirin: Secondary | ICD-10-CM | POA: Diagnosis not present

## 2016-04-15 DIAGNOSIS — E876 Hypokalemia: Secondary | ICD-10-CM | POA: Insufficient documentation

## 2016-04-15 DIAGNOSIS — I2582 Chronic total occlusion of coronary artery: Secondary | ICD-10-CM | POA: Insufficient documentation

## 2016-04-15 DIAGNOSIS — K219 Gastro-esophageal reflux disease without esophagitis: Secondary | ICD-10-CM | POA: Diagnosis not present

## 2016-04-15 DIAGNOSIS — E785 Hyperlipidemia, unspecified: Secondary | ICD-10-CM | POA: Diagnosis present

## 2016-04-15 DIAGNOSIS — Z880 Allergy status to penicillin: Secondary | ICD-10-CM | POA: Diagnosis not present

## 2016-04-15 DIAGNOSIS — Z7984 Long term (current) use of oral hypoglycemic drugs: Secondary | ICD-10-CM | POA: Insufficient documentation

## 2016-04-15 DIAGNOSIS — Y831 Surgical operation with implant of artificial internal device as the cause of abnormal reaction of the patient, or of later complication, without mention of misadventure at the time of the procedure: Secondary | ICD-10-CM | POA: Diagnosis not present

## 2016-04-15 DIAGNOSIS — I2584 Coronary atherosclerosis due to calcified coronary lesion: Secondary | ICD-10-CM | POA: Insufficient documentation

## 2016-04-15 DIAGNOSIS — Z9861 Coronary angioplasty status: Secondary | ICD-10-CM

## 2016-04-15 DIAGNOSIS — K921 Melena: Secondary | ICD-10-CM | POA: Insufficient documentation

## 2016-04-15 DIAGNOSIS — I25110 Atherosclerotic heart disease of native coronary artery with unstable angina pectoris: Secondary | ICD-10-CM | POA: Diagnosis not present

## 2016-04-15 DIAGNOSIS — I2510 Atherosclerotic heart disease of native coronary artery without angina pectoris: Secondary | ICD-10-CM | POA: Diagnosis present

## 2016-04-15 HISTORY — DX: Basal cell carcinoma of skin, unspecified: C44.91

## 2016-04-15 HISTORY — DX: Gastro-esophageal reflux disease without esophagitis: K21.9

## 2016-04-15 HISTORY — PX: LEFT HEART CATH AND CORONARY ANGIOGRAPHY: CATH118249

## 2016-04-15 HISTORY — DX: Type 2 diabetes mellitus without complications: E11.9

## 2016-04-15 HISTORY — DX: Personal history of urinary calculi: Z87.442

## 2016-04-15 HISTORY — PX: CORONARY BALLOON ANGIOPLASTY: CATH118233

## 2016-04-15 HISTORY — DX: Unspecified osteoarthritis, unspecified site: M19.90

## 2016-04-15 HISTORY — DX: Personal history of other medical treatment: Z92.89

## 2016-04-15 HISTORY — DX: Non-ST elevation (NSTEMI) myocardial infarction: I21.4

## 2016-04-15 LAB — BASIC METABOLIC PANEL
Anion gap: 12 (ref 5–15)
BUN: 9 mg/dL (ref 6–20)
CHLORIDE: 98 mmol/L — AB (ref 101–111)
CO2: 26 mmol/L (ref 22–32)
Calcium: 9 mg/dL (ref 8.9–10.3)
Creatinine, Ser: 0.67 mg/dL (ref 0.61–1.24)
GFR calc Af Amer: >60 mL/min (ref >60)
GFR calc non Af Amer: >60 mL/min (ref >60)
Glucose, Bld: 202 mg/dL — ABNORMAL HIGH (ref 65–99)
POTASSIUM: 3.3 mmol/L — AB (ref 3.5–5.1)
SODIUM: 136 mmol/L (ref 135–145)

## 2016-04-15 LAB — GLUCOSE, CAPILLARY
Glucose-Capillary: 154 mg/dL — ABNORMAL HIGH (ref 65–99)
Glucose-Capillary: 266 mg/dL — ABNORMAL HIGH (ref 65–99)

## 2016-04-15 LAB — CBC
HEMATOCRIT: 45.8 % (ref 39.0–52.0)
HEMOGLOBIN: 15.7 g/dL (ref 13.0–17.0)
MCH: 30.8 pg (ref 26.0–34.0)
MCHC: 34.3 g/dL (ref 30.0–36.0)
MCV: 90 fL (ref 78.0–100.0)
Platelets: 186 10*3/uL (ref 150–400)
RBC: 5.09 MIL/uL (ref 4.22–5.81)
RDW: 12.9 % (ref 11.5–15.5)
WBC: 10.5 10*3/uL (ref 4.0–10.5)

## 2016-04-15 LAB — PROTIME-INR
INR: 1.05
PROTHROMBIN TIME: 13.7 s (ref 11.4–15.2)

## 2016-04-15 LAB — TROPONIN I: Troponin I: <0.03 ng/mL (ref <0.03)

## 2016-04-15 SURGERY — LEFT HEART CATH AND CORONARY ANGIOGRAPHY
Anesthesia: LOCAL

## 2016-04-15 MED ORDER — SODIUM CHLORIDE 0.9 % IV SOLN
INTRAVENOUS | Status: DC | PRN
  Administered 2016-04-15: 100 mL/h via INTRAVENOUS

## 2016-04-15 MED ORDER — DIPHENHYDRAMINE HCL 50 MG/ML IJ SOLN
INTRAMUSCULAR | Status: AC
  Filled 2016-04-15: qty 1

## 2016-04-15 MED ORDER — ONDANSETRON HCL 4 MG/2ML IJ SOLN: 4.0000 mg | Freq: Four times a day (QID) | INTRAMUSCULAR | Status: DC | PRN

## 2016-04-15 MED ORDER — AMLODIPINE BESYLATE 10 MG PO TABS
10.0000 mg | ORAL_TABLET | Freq: Every day | ORAL | Status: DC
  Administered 2016-04-16: 10:00:00 10 mg via ORAL
  Filled 2016-04-15: qty 1

## 2016-04-15 MED ORDER — SODIUM CHLORIDE 0.9 % IV SOLN: 250.0000 mL | INTRAVENOUS | Status: DC | PRN

## 2016-04-15 MED ORDER — METHYLPREDNISOLONE SODIUM SUCC 125 MG IJ SOLR
INTRAMUSCULAR | Status: DC | PRN
  Administered 2016-04-15: 125 mg via INTRAVENOUS

## 2016-04-15 MED ORDER — ASPIRIN 81 MG PO CHEW: 81.0000 mg | CHEWABLE_TABLET | ORAL | Status: AC

## 2016-04-15 MED ORDER — HEPARIN (PORCINE) IN NACL 2-0.9 UNIT/ML-% IJ SOLN
INTRAMUSCULAR | Status: AC
  Filled 2016-04-15: qty 1000

## 2016-04-15 MED ORDER — TAMSULOSIN HCL 0.4 MG PO CAPS
0.4000 mg | ORAL_CAPSULE | Freq: Two times a day (BID) | ORAL | Status: DC
  Administered 2016-04-15 – 2016-04-16 (×2): 0.4 mg via ORAL
  Filled 2016-04-15 (×2): qty 1

## 2016-04-15 MED ORDER — SODIUM CHLORIDE 0.9% FLUSH: 3.0000 mL | INTRAVENOUS | Status: DC | PRN

## 2016-04-15 MED ORDER — INSULIN ASPART 100 UNIT/ML ~~LOC~~ SOLN
0.0000 [IU] | Freq: Three times a day (TID) | SUBCUTANEOUS | Status: DC
  Administered 2016-04-16: 3 [IU] via SUBCUTANEOUS

## 2016-04-15 MED ORDER — HEPARIN (PORCINE) IN NACL 100-0.45 UNIT/ML-% IJ SOLN
1200.0000 [IU]/h | INTRAMUSCULAR | Status: DC
  Administered 2016-04-15: 1200 [IU]/h via INTRAVENOUS
  Filled 2016-04-15: qty 250

## 2016-04-15 MED ORDER — MIDAZOLAM HCL 2 MG/2ML IJ SOLN
INTRAMUSCULAR | Status: AC
  Filled 2016-04-15: qty 2

## 2016-04-15 MED ORDER — KETOCONAZOLE 2 % EX CREA
1.0000 "application " | TOPICAL_CREAM | Freq: Two times a day (BID) | CUTANEOUS | Status: DC | PRN
  Filled 2016-04-15: qty 15

## 2016-04-15 MED ORDER — VERAPAMIL HCL 2.5 MG/ML IV SOLN
INTRAVENOUS | Status: AC
  Filled 2016-04-15: qty 2

## 2016-04-15 MED ORDER — HEPARIN (PORCINE) IN NACL 2-0.9 UNIT/ML-% IJ SOLN
INTRAMUSCULAR | Status: DC | PRN
  Administered 2016-04-15 (×2): 1000 mL

## 2016-04-15 MED ORDER — HEPARIN SODIUM (PORCINE) 1000 UNIT/ML IJ SOLN
INTRAMUSCULAR | Status: AC
  Filled 2016-04-15: qty 1

## 2016-04-15 MED ORDER — PANTOPRAZOLE SODIUM 40 MG PO TBEC
40.0000 mg | DELAYED_RELEASE_TABLET | Freq: Every day | ORAL | Status: DC
  Administered 2016-04-15 – 2016-04-16 (×2): 40 mg via ORAL
  Filled 2016-04-15 (×2): qty 1

## 2016-04-15 MED ORDER — NITROGLYCERIN 0.4 MG SL SUBL
0.4000 mg | SUBLINGUAL_TABLET | SUBLINGUAL | Status: DC | PRN
  Administered 2016-04-15: 0.4 mg via SUBLINGUAL
  Filled 2016-04-15: qty 1

## 2016-04-15 MED ORDER — IOPAMIDOL (ISOVUE-370) INJECTION 76%
INTRAVENOUS | Status: AC
Start: 2016-04-15 — End: 2016-04-15
  Filled 2016-04-15: qty 100

## 2016-04-15 MED ORDER — FENTANYL CITRATE (PF) 100 MCG/2ML IJ SOLN
INTRAMUSCULAR | Status: DC | PRN
  Administered 2016-04-15: 25 ug via INTRAVENOUS

## 2016-04-15 MED ORDER — FUROSEMIDE 20 MG PO TABS
20.0000 mg | ORAL_TABLET | ORAL | Status: DC
  Administered 2016-04-16: 20 mg via ORAL
  Filled 2016-04-15: qty 1

## 2016-04-15 MED ORDER — POTASSIUM CHLORIDE CRYS ER 20 MEQ PO TBCR
40.0000 meq | EXTENDED_RELEASE_TABLET | Freq: Once | ORAL | Status: AC
  Administered 2016-04-15: 19:00:00 40 meq via ORAL
  Filled 2016-04-15: qty 2

## 2016-04-15 MED ORDER — SODIUM CHLORIDE 0.9% FLUSH: 3.0000 mL | Freq: Two times a day (BID) | INTRAVENOUS | Status: DC

## 2016-04-15 MED ORDER — HEPARIN BOLUS VIA INFUSION
4000.0000 [IU] | Freq: Once | INTRAVENOUS | Status: AC
  Administered 2016-04-15: 4000 [IU] via INTRAVENOUS

## 2016-04-15 MED ORDER — METHYLPREDNISOLONE SODIUM SUCC 125 MG IJ SOLR
INTRAMUSCULAR | Status: AC
  Filled 2016-04-15: qty 2

## 2016-04-15 MED ORDER — LISINOPRIL 10 MG PO TABS
40.0000 mg | ORAL_TABLET | Freq: Every day | ORAL | Status: DC
  Administered 2016-04-16: 40 mg via ORAL
  Filled 2016-04-15: qty 4

## 2016-04-15 MED ORDER — ATORVASTATIN CALCIUM 80 MG PO TABS
80.0000 mg | ORAL_TABLET | Freq: Every day | ORAL | Status: DC
  Administered 2016-04-15: 22:00:00 80 mg via ORAL
  Filled 2016-04-15 (×2): qty 1

## 2016-04-15 MED ORDER — ASPIRIN 81 MG PO CHEW: 81.0000 mg | CHEWABLE_TABLET | Freq: Every day | ORAL | Status: DC

## 2016-04-15 MED ORDER — DIPHENHYDRAMINE HCL 50 MG/ML IJ SOLN
INTRAMUSCULAR | Status: DC | PRN
  Administered 2016-04-15: 25 mg via INTRAVENOUS

## 2016-04-15 MED ORDER — SODIUM CHLORIDE 0.9 % WEIGHT BASED INFUSION: 1.0000 mL/kg/h | INTRAVENOUS | Status: DC

## 2016-04-15 MED ORDER — PSYLLIUM 95 % PO PACK
1.0000 | PACK | Freq: Every day | ORAL | Status: DC
  Filled 2016-04-15: qty 1

## 2016-04-15 MED ORDER — NITROGLYCERIN 0.4 MG SL SUBL: 0.4000 mg | SUBLINGUAL_TABLET | SUBLINGUAL | Status: DC | PRN

## 2016-04-15 MED ORDER — SODIUM CHLORIDE 0.9 % WEIGHT BASED INFUSION: 1.0000 mL/kg/h | INTRAVENOUS | Status: AC

## 2016-04-15 MED ORDER — IOPAMIDOL (ISOVUE-370) INJECTION 76%
INTRAVENOUS | Status: AC
  Filled 2016-04-15: qty 50

## 2016-04-15 MED ORDER — FENTANYL CITRATE (PF) 100 MCG/2ML IJ SOLN
INTRAMUSCULAR | Status: AC
  Filled 2016-04-15: qty 2

## 2016-04-15 MED ORDER — POTASSIUM CHLORIDE CRYS ER 20 MEQ PO TBCR
20.0000 meq | EXTENDED_RELEASE_TABLET | Freq: Every day | ORAL | Status: DC
  Administered 2016-04-16: 20 meq via ORAL
  Filled 2016-04-15: qty 1

## 2016-04-15 MED ORDER — ACETAMINOPHEN 325 MG PO TABS: 650.0000 mg | ORAL_TABLET | ORAL | Status: DC | PRN

## 2016-04-15 MED ORDER — SODIUM CHLORIDE 0.9 % WEIGHT BASED INFUSION: 3.0000 mL/kg/h | INTRAVENOUS | Status: AC

## 2016-04-15 MED ORDER — CARVEDILOL 12.5 MG PO TABS
25.0000 mg | ORAL_TABLET | Freq: Two times a day (BID) | ORAL | Status: DC
  Administered 2016-04-15 – 2016-04-16 (×2): 25 mg via ORAL
  Filled 2016-04-15 (×2): qty 2

## 2016-04-15 MED ORDER — SODIUM CHLORIDE 0.9% FLUSH
3.0000 mL | INTRAVENOUS | Status: DC | PRN
Start: 2016-04-15 — End: 2016-04-15

## 2016-04-15 MED ORDER — ANGIOPLASTY BOOK
Freq: Once | Status: AC
  Administered 2016-04-15: 20:00:00
  Filled 2016-04-15: qty 1

## 2016-04-15 MED ORDER — FAMOTIDINE IN NACL 20-0.9 MG/50ML-% IV SOLN
INTRAVENOUS | Status: AC
  Filled 2016-04-15: qty 50

## 2016-04-15 MED ORDER — LIDOCAINE HCL (PF) 1 % IJ SOLN
INTRAMUSCULAR | Status: DC | PRN
  Administered 2016-04-15: 2 mL

## 2016-04-15 MED ORDER — INSULIN ASPART 100 UNIT/ML ~~LOC~~ SOLN
0.0000 [IU] | Freq: Every day | SUBCUTANEOUS | Status: DC
  Administered 2016-04-15: 22:00:00 3 [IU] via SUBCUTANEOUS

## 2016-04-15 MED ORDER — CLOPIDOGREL BISULFATE 75 MG PO TABS
75.0000 mg | ORAL_TABLET | Freq: Every day | ORAL | Status: DC
  Administered 2016-04-16: 75 mg via ORAL
  Filled 2016-04-15: qty 1

## 2016-04-15 MED ORDER — IOPAMIDOL (ISOVUE-370) INJECTION 76%
INTRAVENOUS | Status: DC | PRN
  Administered 2016-04-15: 195 mL via INTRA_ARTERIAL

## 2016-04-15 MED ORDER — MIDAZOLAM HCL 2 MG/2ML IJ SOLN
INTRAMUSCULAR | Status: DC | PRN
  Administered 2016-04-15: 1 mg via INTRAVENOUS

## 2016-04-15 MED ORDER — ISOSORBIDE MONONITRATE ER 30 MG PO TB24
30.0000 mg | ORAL_TABLET | Freq: Every day | ORAL | Status: DC
  Administered 2016-04-16: 30 mg via ORAL
  Filled 2016-04-15: qty 1

## 2016-04-15 MED ORDER — HEPARIN SODIUM (PORCINE) 1000 UNIT/ML IJ SOLN
INTRAMUSCULAR | Status: DC | PRN
  Administered 2016-04-15: 5000 [IU] via INTRAVENOUS
  Administered 2016-04-15: 4000 [IU] via INTRAVENOUS

## 2016-04-15 MED ORDER — VERAPAMIL HCL 2.5 MG/ML IV SOLN
INTRAVENOUS | Status: DC | PRN
  Administered 2016-04-15: 10 mL via INTRA_ARTERIAL

## 2016-04-15 MED ORDER — ASPIRIN 81 MG PO CHEW
81.0000 mg | CHEWABLE_TABLET | Freq: Every day | ORAL | Status: DC
  Administered 2016-04-16: 81 mg via ORAL
  Filled 2016-04-15: qty 1

## 2016-04-15 MED ORDER — FAMOTIDINE IN NACL 20-0.9 MG/50ML-% IV SOLN
INTRAVENOUS | Status: DC | PRN
  Administered 2016-04-15: 20 mg via INTRAVENOUS

## 2016-04-15 SURGICAL SUPPLY — 24 items
BALLN ANGIOSCULPT RX 3.5X10 (BALLOONS) ×2
BALLN EUPHORA RX 1.5X12 (BALLOONS) ×2
BALLN EUPHORA RX 2.5X12 (BALLOONS) ×2
BALLN ~~LOC~~ MOZEC 3.0X10 (BALLOONS) ×2
BALLOON ANGIOSCULPT RX 3.5X10 (BALLOONS) ×1 IMPLANT
BALLOON EUPHORA RX 1.5X12 (BALLOONS) ×1 IMPLANT
BALLOON EUPHORA RX 2.5X12 (BALLOONS) ×1 IMPLANT
BALLOON ~~LOC~~ MOZEC 3.0X10 (BALLOONS) ×1 IMPLANT
CATH HEARTRAIL 6F IL4.0 (CATHETERS) ×2 IMPLANT
CATH INFINITI 5FR MULTPACK ANG (CATHETERS) ×2 IMPLANT
DEVICE RAD COMP TR BAND LRG (VASCULAR PRODUCTS) ×2 IMPLANT
GLIDESHEATH SLEND SS 6F .021 (SHEATH) ×2 IMPLANT
GUIDE CATH RUNWAY 6FR CLS3.5 (CATHETERS) ×2 IMPLANT
GUIDE CATH RUNWAY 6FR CLS4 (CATHETERS) ×2 IMPLANT
GUIDE CATH RUNWAY 6FR VL3.5 (CATHETERS) ×2 IMPLANT
GUIDEWIRE INQWIRE 1.5J.035X260 (WIRE) ×1 IMPLANT
INQWIRE 1.5J .035X260CM (WIRE) ×2
KIT ENCORE 26 ADVANTAGE (KITS) ×2 IMPLANT
KIT HEART LEFT (KITS) ×2 IMPLANT
PACK CARDIAC CATHETERIZATION (CUSTOM PROCEDURE TRAY) ×2 IMPLANT
SYR MEDRAD MARK V 150ML (SYRINGE) ×2 IMPLANT
TRANSDUCER W/STOPCOCK (MISCELLANEOUS) ×2 IMPLANT
TUBING CIL FLEX 10 FLL-RA (TUBING) ×2 IMPLANT
WIRE ASAHI PROWATER 180CM (WIRE) ×4 IMPLANT

## 2016-04-15 NOTE — H&P (View-Only) (Signed)
Cardiology Consultation   Patient ID: Sean Villarreal; [id number]; 16-Apr-1940   Admit date: 04/15/2016 Date of Consult: 04/15/2016  Referring MD: Dr. Dayna Barker Cardiologist: Dr. Domenic Polite Consulting Cardiologist: Dr. Domenic Polite  Patient Care Team: Sean Percy, MD as PCP - General (Family Medicine) Sean Haber, MD as Referring Physician (Urology)    Reason for Consultation: Chest pain   History of Present Illness: [name] is a 76 y.o. male with a hx of CAD status post DES to the LAD and circumflex in 2014. Low risk nuclear stress test with small mildly reversible defect in the apex and fixed defect in the mid basal inferior wall LVEF 53% 03/21/13. 2-D echo 06/18/15 with normal LVEF 60-65% with grade 1 DD. He was clinically stable as of office visit December 2017 on medical therapy.  Patient comes in today with 1 week history of progressive chest pain into shoulder blades. At first was exertional but yesterday had most of the day that worsened last night relieved with NTG. Pain returned this am eased with NTG in ER. EKG NSR without acute change, initial troponin negative. Pain free currently. Very similar to prior angina. Recent colonoscopy for melena 03/19/16 diverticulosis and Hbg stable. Had to cath from groin last time.  Past Medical History:  Diagnosis Date  . Coronary artery disease    DES to LAD and circumflex April 2014  . Dyslipidemia   . Essential hypertension   . Hematuria   . Nephrolithiasis   . Type 2 diabetes mellitus ([place])     Past Surgical History:  Procedure Laterality Date  . COLONOSCOPY N/A 03/19/2016   Procedure: COLONOSCOPY;  Surgeon: Rogene Houston, MD;  Location: [facility];  Service: Endoscopy;  Laterality: N/A;  9:15  . LEFT HEART CATHETERIZATION WITH CORONARY ANGIOGRAM N/A 05/05/2012   Procedure: LEFT HEART CATHETERIZATION WITH CORONARY ANGIOGRAM;  Surgeon: Burnell Blanks, MD;  Location: [facility] CATH LAB;  Service: Cardiovascular;  Laterality:  N/A;  . LEFT HEART CATHETERIZATION WITH CORONARY ANGIOGRAM N/A 05/15/2012   Procedure: LEFT HEART CATHETERIZATION WITH CORONARY ANGIOGRAM;  Surgeon: Burnell Blanks, MD;  Location: [facility] CATH LAB;  Service: Cardiovascular;  Laterality: N/A;      Home Meds: Prior to Admission medications   Medication Sig Start Date End Date Taking? Authorizing Provider  amLODipine (NORVASC) 10 MG tablet Take 1 tablet (10 mg total) by mouth daily. 06/13/12  Yes Donney Dice, PA-C  aspirin 81 MG tablet Take 81 mg by mouth daily.   Yes Historical Provider, MD  atorvastatin (LIPITOR) 80 MG tablet Take 80 mg by mouth at bedtime. 05/06/12  Yes Brooke O Edmisten, PA-C  carvedilol (COREG) 25 MG tablet Take 1 tablet (25 mg total) by mouth 2 (two) times daily with a meal. 05/15/12  Yes Rhonda G Barrett, PA-C  clopidogrel (PLAVIX) 75 MG tablet TAKE 1 TABLET BY MOUTH  DAILY 02/18/16  Yes Satira Sark, MD  esomeprazole (NEXIUM 24HR) 20 MG capsule Take 20 mg by mouth daily as needed (for acid reflux).   Yes Historical Provider, MD  furosemide (LASIX) 20 MG tablet Take 1 tablet (20 mg total) by mouth every other day. 01/09/16  Yes Satira Sark, MD  glimepiride (AMARYL) 1 MG tablet Take 1 mg by mouth daily with breakfast.  03/03/12  Yes Historical Provider, MD  isosorbide mononitrate (IMDUR) 30 MG 24 hr tablet Take 1 tablet (30 mg total) by mouth daily. 05/15/12  Yes Rhonda G Barrett, PA-C  ketoconazole (NIZORAL) 2 %  cream Apply 1 application topically 2 (two) times daily as needed (apply to face as needed (uses during the winter)). For dry skin   Yes Historical Provider, MD  lisinopril (PRINIVIL,ZESTRIL) 40 MG tablet Take 1 tablet (40 mg total) by mouth daily. 05/15/12  Yes Rhonda G Barrett, PA-C  metFORMIN (GLUCOPHAGE) 1000 MG tablet Take 1,000 mg by mouth 2 (two) times daily.  12/09/15  Yes Historical Provider, MD  nitroGLYCERIN (NITROSTAT) 0.4 MG SL tablet Place 1 tablet (0.4 mg total) under the tongue every 5 (five)  minutes x 3 doses as needed for chest pain. 05/23/13  Yes Carlena Bjornstad, MD  potassium chloride SA (K-DUR,KLOR-CON) 20 MEQ tablet Take 1 tablet (20 mEq total) by mouth daily. 05/15/12  Yes Rhonda G Barrett, PA-C  psyllium (METAMUCIL SMOOTH TEXTURE) 28 % packet Take 1 packet by mouth at bedtime. 03/19/16  Yes Rogene Houston, MD  tamsulosin (FLOMAX) 0.4 MG CAPS capsule Take 0.4 mg by mouth 2 (two) times daily.    Yes Historical Provider, MD    Current Medications: . [START ON 04/16/2016] amLODipine  10 mg Oral Daily  . [START ON 04/16/2016] aspirin  81 mg Oral Daily  . atorvastatin  80 mg Oral QHS  . carvedilol  25 mg Oral BID WC  . [START ON 04/16/2016] clopidogrel  75 mg Oral Daily  . [START ON 04/16/2016] furosemide  20 mg Oral QODAY  . heparin  4,000 Units Intravenous Once  . isosorbide mononitrate  30 mg Oral Daily  . lisinopril  40 mg Oral Daily  . pantoprazole  40 mg Oral Daily  . [START ON 04/16/2016] potassium chloride SA  20 mEq Oral Daily  . potassium chloride  40 mEq Oral Once  . psyllium  1 packet Oral QHS  . tamsulosin  0.4 mg Oral BID     Allergies:    Allergies  Allergen Reactions  . Penicillins Other (See Comments)    Passed out as a child  . Contrast Media [Iodinated Diagnostic Agents] Other (See Comments)    Had "welts" on arm, and kidney issues after given dye in the past    Social History:   The patient  reports that he quit smoking about 27 years ago. His smoking use included Cigarettes. He started smoking about 34 years ago. He has never used smokeless tobacco. He reports that he drinks alcohol. He reports that he does not use drugs.    Family History:   The patient's family history includes Heart attack in his father; Heart disease in his father and mother; Heart failure in his mother.   ROS:  Please see the history of present illness.  Review of Systems  Constitution: Negative.  HENT: Negative.   Cardiovascular: Positive for chest pain and dyspnea on  exertion.  Respiratory: Negative.   Endocrine: Negative.   Hematologic/Lymphatic: Negative.   Musculoskeletal: Negative.   Gastrointestinal: Positive for bloating and melena.       Recent colonoscopy diverticulosis  Genitourinary: Negative.   Neurological: Negative.    All other ROS reviewed and negative.      Vital Signs: Blood pressure (!) 148/89, pulse 65, temperature 97.6 F (36.4 C), temperature source Oral, resp. rate 13, height 5\' 10"  (1.778 m), weight 220 lb (99.8 kg), SpO2 97 %.   PHYSICAL EXAM: General:  Well nourished, well developed, in no acute distress  HEENT: normal Lymph: no adenopathy Neck: no JVD Endocrine:  No thryomegaly Vascular: No carotid bruits; FA pulses 2+ bilaterally without  bruits  Cardiac:  RRR; normal S1, S2; S4, 2/6 diastolic murmur LSB and systolic murmur apex, no rub, bruit, thrill, or heave Lungs:  clear to auscultation bilaterally, no wheezing, rhonchi or rales  Abd: soft, nontender, no hepatomegaly  Ext: no edema, Good distal pulses bilaterally Musculoskeletal:  No deformities, BUE and BLE strength normal and equal Skin: warm and dry  Neuro:  CNs 2-12 intact, no focal abnormalities noted Psych:  Normal affect    EKG: NSR without acute change  Telemetry: NSR  Labs:  Recent Labs  04/15/16 1014  TROPONINI <0.03   Lab Results  Component Value Date   WBC 10.5 04/15/2016   HGB 15.7 04/15/2016   HCT 45.8 04/15/2016   MCV 90.0 04/15/2016   PLT 186 04/15/2016    Recent Labs Lab 04/15/16 1014  NA 136  K 3.3*  CL 98*  CO2 26  BUN 9  CREATININE 0.67  CALCIUM 9.0  GLUCOSE 202*   Lab Results  Component Value Date   CHOL 126 05/06/2012   HDL 45 05/06/2012   LDLCALC 60 05/06/2012   TRIG 104 05/06/2012   No results found for: DDIMER  Radiology/Studies:  Dg Chest 2 View  Result Date: 04/15/2016 CLINICAL DATA:  Left side chest pain for 1 week EXAM: CHEST  2 VIEW COMPARISON:  Report 05/11/2012 no images available FINDINGS:  Cardiomediastinal silhouette is unremarkable. No infiltrate or pleural effusion. No pulmonary edema. Mild degenerative changes lower thoracic spine. IMPRESSION: No active cardiopulmonary disease. Electronically Signed   By: Lahoma Crocker M.D.   On: 04/15/2016 10:04    ASSESSMENT AND PLAN:  Unstable angina begin IV heparin and transfer for cath. I have reviewed the risks, indications, and alternatives to angioplasty and stenting with the patient. Risks include but are not limited to bleeding, infection, vascular injury, stroke, myocardial infection, arrhythmia, kidney injury, radiation-related injury in the case of prolonged fluoroscopy use, emergency cardiac surgery, and death. The patient understands the risks of serious complication is low (<2%) and he agrees to proceed.   CAD S/P DES LAD and Cfx 2014, low risk myoview 2015, normal LV function  Hypokalemia: replace  HTN on coreg norvasc lisinopril imdur  HLD on lipitor  DM  Signed, Ermalinda Barrios PA-C 04/15/2016 12:53 PM    Attending note:  Patient seen and examined. Reviewed records and discussed the case with Ms. Bonnell Public PA-C. Mr. Fullilove has a history of CAD status post DES to the LAD and circumflex in April 2014. He was clinically stable on medical therapy at office visit in December 2017. Over the last week he has been experiencing increasing exertional chest tightness radiating to the shoulder blades, symptoms became worse at rest yesterday and he used nitroglycerin for the first time in years. He came to the ER at the encouragement of his wife with symptoms that reminded him very much of prior angina. He otherwise reports regular medication use. Of note, he did undergo a recent colonoscopy due to melena and was found to have diverticulosis. Hemoglobin is normal at this time.  On examination in the ER he appears comfortable. Systolic blood pressure in the 130s to 140s, heart rate in the 60s to 70s in sinus rhythm. Lungs are clear without  labored breathing, cardiac exam with RRR no gallop. Lab work shows potassium 3.3, creatinine 0.67, troponin I less than 0.03, hemoglobin 15.7. ECG does not show any acute changes and chest x-ray shows no acute process.  Symptoms consistent with unstable angina. Initial troponin I  negative and ECG without acute changes. Patient states that symptoms are very similar to prior angina in 2014. He reports compliance with his medications. We discussed transfer to [facility] for a diagnostic cardiac catheterization, reviewed risks and benefits and he is in agreement to proceed. Heparin will be initiated, replete potassium, otherwise continue cardiac medical regimen pending further workup. Of note, he states that his prior procedures were done from the right femoral artery with difficult radial access.  Satira Sark, M.D., F.A.C.C.

## 2016-04-15 NOTE — Progress Notes (Signed)
ANTICOAGULATION CONSULT NOTE - Initial Consult  Pharmacy Consult for Heparin Indication: chest pain/ACS  Allergies  Allergen Reactions  . Penicillins Other (See Comments)    Passed out as a child  . Contrast Media [Iodinated Diagnostic Agents] Other (See Comments)    Had "welts" on arm, and kidney issues after given dye in the past    Patient Measurements: Height: 5\' 10"  (177.8 cm) Weight: 220 lb (99.8 kg) IBW/kg (Calculated) : 73 HEPARIN DW (KG): 93.8  Vital Signs: Temp: 97.6 F (36.4 C) (03/22 0945) Temp Source: Oral (03/22 0945) BP: 148/89 (03/22 1130) Pulse Rate: 65 (03/22 1130)  Labs:  Recent Labs  04/15/16 1014  HGB 15.7  HCT 45.8  PLT 186  CREATININE 0.67  TROPONINI <0.03    Estimated Creatinine Clearance: 95.9 mL/min (by C-G formula based on SCr of 0.67 mg/dL).   Medical History: Past Medical History:  Diagnosis Date  . Coronary artery disease    DES to LAD and circumflex April 2014  . Dyslipidemia   . Essential hypertension   . Hematuria   . Nephrolithiasis   . Type 2 diabetes mellitus ([facility])     Medications:  See med rec  Assessment: 76 y.o. male with a hx of CAD status post DES to the LAD and circumflex in 2014 presents to ED with 1 week history of progressive chest pain. Initial troponin negative. Plan is for IV heparin and transfer for Cath to [facility]   Goal of Therapy:  Heparin level 0.3-0.7 units/ml Monitor platelets by anticoagulation protocol: Yes   Plan:  Give 4000 units bolus x 1 Start heparin infusion at 1200 units/hr Check anti-Xa level in 6-8 hours and daily while on heparin Continue to monitor H&H and platelets   Isac Sarna, BS Vena Austria, BCPS Clinical Pharmacist Pager [phone] 04/15/2016,12:22 PM

## 2016-04-15 NOTE — Interval H&P Note (Signed)
History and Physical Interval Note:  04/15/2016 2:36 PM  Sean Villarreal  has presented today for surgery, with the diagnosis of cp  The various methods of treatment have been discussed with the patient and family. After consideration of risks, benefits and other options for treatment, the patient has consented to  Procedure(s): Left Heart Cath and Coronary Angiography (N/A) as a surgical intervention .  The patient's history has been reviewed, patient examined, no change in status, stable for surgery.  I have reviewed the patient's chart and labs.  Questions were answered to the patient's satisfaction.   Cath Lab Visit (complete for each Cath Lab visit)  Clinical Evaluation Leading to the Procedure:   ACS: Yes.    Non-ACS:    Anginal Classification: CCS III  Anti-ischemic medical therapy: Maximal Therapy (2 or more classes of medications)  Non-Invasive Test Results: No non-invasive testing performed  Prior CABG: No previous CABG        [name] 04/15/2016 2:36 PM

## 2016-04-15 NOTE — Progress Notes (Signed)
TR BAND REMOVAL  LOCATION:    left radial  DEFLATED PER PROTOCOL:    Yes.    TIME BAND OFF / DRESSING APPLIED:    21:45   SITE UPON ARRIVAL:    Level 0  SITE AFTER BAND REMOVAL:    Level 0  CIRCULATION SENSATION AND MOVEMENT:    Within Normal Limits   Yes.    COMMENTS:   Post TR band instructions given. Pt tolerated well. 

## 2016-04-15 NOTE — Consult Note (Signed)
Cardiology Consultation   Patient ID: Sean Villarreal; [id number]; [birth year]   Admit date: 04/15/2016 Date of Consult: 04/15/2016  Referring MD: Dr. Dayna Barker Cardiologist: Dr. Domenic Polite Consulting Cardiologist: Dr. Domenic Polite  Patient Care Team: Rory Percy, MD as PCP - General (Family Medicine) Sadie Haber, MD as Referring Physician (Urology)    Reason for Consultation: Chest pain   History of Present Illness: Sean Villarreal is a 76 y.o. male with a hx of CAD status post DES to the LAD and circumflex in 2014. Low risk nuclear stress test with small mildly reversible defect in the apex and fixed defect in the mid basal inferior wall LVEF 53% 03/21/13. 2-D echo 06/18/15 with normal LVEF 60-65% with grade 1 DD. He was clinically stable as of office visit December 2017 on medical therapy.  Patient comes in today with 1 week history of progressive chest pain into shoulder blades. At first was exertional but yesterday had most of the day that worsened last night relieved with NTG. Pain returned this am eased with NTG in ER. EKG NSR without acute change, initial troponin negative. Pain free currently. Very similar to prior angina. Recent colonoscopy for melena 03/19/16 diverticulosis and Hbg stable. Had to cath from groin last time.  Past Medical History:  Diagnosis Date  . Coronary artery disease    DES to LAD and circumflex April 2014  . Dyslipidemia   . Essential hypertension   . Hematuria   . Nephrolithiasis   . Type 2 diabetes mellitus ([place])     Past Surgical History:  Procedure Laterality Date  . COLONOSCOPY N/A 03/19/2016   Procedure: COLONOSCOPY;  Surgeon: Rogene Houston, MD;  [facility];  Service: Endoscopy;  Laterality: N/A;  9:15  . LEFT HEART CATHETERIZATION WITH CORONARY ANGIOGRAM N/A 05/05/2012   Procedure: LEFT HEART CATHETERIZATION WITH CORONARY ANGIOGRAM;  Surgeon: Burnell Blanks, MD;  Location: [facility] CATH LAB;  Service: Cardiovascular;  Laterality:  N/A;  . LEFT HEART CATHETERIZATION WITH CORONARY ANGIOGRAM N/A 05/15/2012   Procedure: LEFT HEART CATHETERIZATION WITH CORONARY ANGIOGRAM;  Surgeon: Burnell Blanks, MD;  Location: [facility] CATH LAB;  Service: Cardiovascular;  Laterality: N/A;      Home Meds: Prior to Admission medications   Medication Sig Start Date End Date Taking? Authorizing Provider  amLODipine (NORVASC) 10 MG tablet Take 1 tablet (10 mg total) by mouth daily. 06/13/12  Yes Donney Dice, PA-C  aspirin 81 MG tablet Take 81 mg by mouth daily.   Yes Historical Provider, MD  atorvastatin (LIPITOR) 80 MG tablet Take 80 mg by mouth at bedtime. 05/06/12  Yes Brooke O Edmisten, PA-C  carvedilol (COREG) 25 MG tablet Take 1 tablet (25 mg total) by mouth 2 (two) times daily with a meal. 05/15/12  Yes Rhonda G Barrett, PA-C  clopidogrel (PLAVIX) 75 MG tablet TAKE 1 TABLET BY MOUTH  DAILY 02/18/16  Yes Satira Sark, MD  esomeprazole (NEXIUM 24HR) 20 MG capsule Take 20 mg by mouth daily as needed (for acid reflux).   Yes Historical Provider, MD  furosemide (LASIX) 20 MG tablet Take 1 tablet (20 mg total) by mouth every other day. 01/09/16  Yes Satira Sark, MD  glimepiride (AMARYL) 1 MG tablet Take 1 mg by mouth daily with breakfast.  03/03/12  Yes Historical Provider, MD  isosorbide mononitrate (IMDUR) 30 MG 24 hr tablet Take 1 tablet (30 mg total) by mouth daily. 05/15/12  Yes Rhonda G Barrett, PA-C  ketoconazole (NIZORAL) 2 %  cream Apply 1 application topically 2 (two) times daily as needed (apply to face as needed (uses during the winter)). For dry skin   Yes Historical Provider, MD  lisinopril (PRINIVIL,ZESTRIL) 40 MG tablet Take 1 tablet (40 mg total) by mouth daily. 05/15/12  Yes Rhonda G Barrett, PA-C  metFORMIN (GLUCOPHAGE) 1000 MG tablet Take 1,000 mg by mouth 2 (two) times daily.  12/09/15  Yes Historical Provider, MD  nitroGLYCERIN (NITROSTAT) 0.4 MG SL tablet Place 1 tablet (0.4 mg total) under the tongue every 5 (five)  minutes x 3 doses as needed for chest pain. 05/23/13  Yes Carlena Bjornstad, MD  potassium chloride SA (K-DUR,KLOR-CON) 20 MEQ tablet Take 1 tablet (20 mEq total) by mouth daily. 05/15/12  Yes Rhonda G Barrett, PA-C  psyllium (METAMUCIL SMOOTH TEXTURE) 28 % packet Take 1 packet by mouth at bedtime. 03/19/16  Yes Rogene Houston, MD  tamsulosin (FLOMAX) 0.4 MG CAPS capsule Take 0.4 mg by mouth 2 (two) times daily.    Yes Historical Provider, MD    Current Medications: . [START ON 04/16/2016] amLODipine  10 mg Oral Daily  . [START ON 04/16/2016] aspirin  81 mg Oral Daily  . atorvastatin  80 mg Oral QHS  . carvedilol  25 mg Oral BID WC  . [START ON 04/16/2016] clopidogrel  75 mg Oral Daily  . [START ON 04/16/2016] furosemide  20 mg Oral QODAY  . heparin  4,000 Units Intravenous Once  . isosorbide mononitrate  30 mg Oral Daily  . lisinopril  40 mg Oral Daily  . pantoprazole  40 mg Oral Daily  . [START ON 04/16/2016] potassium chloride SA  20 mEq Oral Daily  . potassium chloride  40 mEq Oral Once  . psyllium  1 packet Oral QHS  . tamsulosin  0.4 mg Oral BID     Allergies:    Allergies  Allergen Reactions  . Penicillins Other (See Comments)    Passed out as a child  . Contrast Media [Iodinated Diagnostic Agents] Other (See Comments)    Had "welts" on arm, and kidney issues after given dye in the past    Social History:   The patient  reports that he quit smoking about 27 years ago. His smoking use included Cigarettes. He started smoking about 34 years ago. He has never used smokeless tobacco. He reports that he drinks alcohol. He reports that he does not use drugs.    Family History:   The patient's family history includes Heart attack in his father; Heart disease in his father and mother; Heart failure in his mother.   ROS:  Please see the history of present illness.  Review of Systems  Constitution: Negative.  HENT: Negative.   Cardiovascular: Positive for chest pain and dyspnea on  exertion.  Respiratory: Negative.   Endocrine: Negative.   Hematologic/Lymphatic: Negative.   Musculoskeletal: Negative.   Gastrointestinal: Positive for bloating and melena.       Recent colonoscopy diverticulosis  Genitourinary: Negative.   Neurological: Negative.    All other ROS reviewed and negative.      Vital Signs: Blood pressure (!) 148/89, pulse 65, temperature 97.6 F (36.4 C), temperature source Oral, resp. rate 13, height 5\' 10"  (1.778 m), weight 220 lb (99.8 kg), SpO2 97 %.   PHYSICAL EXAM: General:  Well nourished, well developed, in no acute distress  HEENT: normal Lymph: no adenopathy Neck: no JVD Endocrine:  No thryomegaly Vascular: No carotid bruits; FA pulses 2+ bilaterally without  bruits  Cardiac:  RRR; normal S1, S2; S4, 2/6 diastolic murmur LSB and systolic murmur apex, no rub, bruit, thrill, or heave Lungs:  clear to auscultation bilaterally, no wheezing, rhonchi or rales  Abd: soft, nontender, no hepatomegaly  Ext: no edema, Good distal pulses bilaterally Musculoskeletal:  No deformities, BUE and BLE strength normal and equal Skin: warm and dry  Neuro:  CNs 2-12 intact, no focal abnormalities noted Psych:  Normal affect    EKG: NSR without acute change  Telemetry: NSR  Labs:  Recent Labs  04/15/16 1014  TROPONINI <0.03   Lab Results  Component Value Date   WBC 10.5 04/15/2016   HGB 15.7 04/15/2016   HCT 45.8 04/15/2016   MCV 90.0 04/15/2016   PLT 186 04/15/2016    Recent Labs Lab 04/15/16 1014  NA 136  K 3.3*  CL 98*  CO2 26  BUN 9  CREATININE 0.67  CALCIUM 9.0  GLUCOSE 202*   Lab Results  Component Value Date   CHOL 126 05/06/2012   HDL 45 05/06/2012   LDLCALC 60 05/06/2012   TRIG 104 05/06/2012   No results found for: DDIMER  Radiology/Studies:  Dg Chest 2 View  Result Date: 04/15/2016 CLINICAL DATA:  Left side chest pain for 1 week EXAM: CHEST  2 VIEW COMPARISON:  Report 05/11/2012 no images available FINDINGS:  Cardiomediastinal silhouette is unremarkable. No infiltrate or pleural effusion. No pulmonary edema. Mild degenerative changes lower thoracic spine. IMPRESSION: No active cardiopulmonary disease. Electronically Signed   By: Lahoma Crocker M.D.   On: 04/15/2016 10:04    ASSESSMENT AND PLAN:  Unstable angina begin IV heparin and transfer for cath. I have reviewed the risks, indications, and alternatives to angioplasty and stenting with the patient. Risks include but are not limited to bleeding, infection, vascular injury, stroke, myocardial infection, arrhythmia, kidney injury, radiation-related injury in the case of prolonged fluoroscopy use, emergency cardiac surgery, and death. The patient understands the risks of serious complication is low (<1%) and he agrees to proceed.   CAD S/P DES LAD and Cfx 2014, low risk myoview 2015, normal LV function  Hypokalemia: replace  HTN on coreg norvasc lisinopril imdur  HLD on lipitor  DM  Signed, Ermalinda Barrios PA-C 04/15/2016 12:53 PM    Attending note:  Patient seen and examined. Reviewed records and discussed the case with Ms. Bonnell Public PA-C. Mr. Passey has a history of CAD status post DES to the LAD and circumflex in April 2014. He was clinically stable on medical therapy at office visit in December 2017. Over the last week he has been experiencing increasing exertional chest tightness radiating to the shoulder blades, symptoms became worse at rest yesterday and he used nitroglycerin for the first time in years. He came to the ER at the encouragement of his wife with symptoms that reminded him very much of prior angina. He otherwise reports regular medication use. Of note, he did undergo a recent colonoscopy due to melena and was found to have diverticulosis. Hemoglobin is normal at this time.  On examination in the ER he appears comfortable. Systolic blood pressure in the 130s to 140s, heart rate in the 60s to 70s in sinus rhythm. Lungs are clear without  labored breathing, cardiac exam with RRR no gallop. Lab work shows potassium 3.3, creatinine 0.67, troponin I less than 0.03, hemoglobin 15.7. ECG does not show any acute changes and chest x-ray shows no acute process.  Symptoms consistent with unstable angina. Initial troponin I  negative and ECG without acute changes. Patient states that symptoms are very similar to prior angina in 2014. He reports compliance with his medications. We discussed transfer to [facility] for a diagnostic cardiac catheterization, reviewed risks and benefits and he is in agreement to proceed. Heparin will be initiated, replete potassium, otherwise continue cardiac medical regimen pending further workup. Of note, he states that his prior procedures were done from the right femoral artery with difficult radial access.  Satira Sark, M.D., F.A.C.C.

## 2016-04-15 NOTE — ED Triage Notes (Signed)
Patient c/o left side chest pain x1 week. Patient reports pain radiating into left arm and shoulder blades bilaterally. Patient state hx of stents. Per patient took baby aspirin this morning and took nitro SL last night with some improvement. Per patient intermittent shortness of breath and dizziness.

## 2016-04-15 NOTE — ED Provider Notes (Signed)
[place] DEPT Provider Note   CSN: [id number] Arrival date & time: 04/15/16  1696  By signing my name below, I, Sean Villarreal, attest that this documentation has been prepared under the direction and in the presence of [name], MD. Electronically Signed: Ethelle Lyon Villarreal, Scribe. 04/15/2016. 10:42 AM.  History   Chief Complaint Chief Complaint  Patient presents with  . Chest Pain   The history is provided by the patient and medical records. No language interpreter was used.    HPI Comments:  Sean Villarreal is an obese 76 y.o. male with a PMHx of CAD, Dyslipidemia, HTN, Nephrolithiasis, and DM-2, who presents to the Emergency Department complaining of left-sided, 8/10, aching, pressure-like CP onset one week. Pt reports the pain radiating into his left arm and bilateral shoulder blades with some discomfort in his jaw. Per pt, he has a h/o two stent placements in 2014. He states this pain feels similar to a past MI but not as severe. No recent injury. Pt has associated symptoms of intermittent mild SOB, nausea, and mild dizziness. He also notes some chronic foot swelling that worsens throughout the day. He tried ASA 81mg  this morning as well as NTGsl last night with some relief of his symptoms. Pt reports exertion slightly exacerbates his pain and notes he feels mildly fatigued for the past week. No abnormal drug usage stated, mild EtOH consumption with one beer. Pt is currently on anticoagulant, Plavix. He denies abdominal pain, back pain, vomiting, fever, cough, and any other complaints at this time. Pt is a former smoker quitting in 1990.   Cardiologist: Dr. Rozann Lesches  Past Medical History:  Diagnosis Date  . Coronary artery disease    DES to LAD and circumflex April 2014  . Dyslipidemia   . Essential hypertension   . Hematuria   . Nephrolithiasis   . Type 2 diabetes mellitus [facility])    Patient Active Problem List   Diagnosis Date Noted  . Guaiac positive stools  02/23/2016  . Preop cardiovascular exam 11/05/2013  . GERD (gastroesophageal reflux disease) 05/23/2013  . Ejection fraction   . Edema   . Shortness of breath   . Hematuria   . Nephrolithiasis   . Hypokalemia 05/14/2012  . Dyslipidemia 05/14/2012  . Hypertension   . Coronary artery disease 05/06/2012  . DM (diabetes mellitus) ([place]) 05/06/2012   Past Surgical History:  Procedure Laterality Date  . COLONOSCOPY N/A 03/19/2016   Procedure: COLONOSCOPY;  Surgeon: Rogene Houston, MD;  [facility];  Service: Endoscopy;  Laterality: N/A;  9:15  . LEFT HEART CATHETERIZATION WITH CORONARY ANGIOGRAM N/A 05/05/2012   Procedure: LEFT HEART CATHETERIZATION WITH CORONARY ANGIOGRAM;  Surgeon: Burnell Blanks, MD;  Location: [facility] CATH LAB;  Service: Cardiovascular;  Laterality: N/A;  . LEFT HEART CATHETERIZATION WITH CORONARY ANGIOGRAM N/A 05/15/2012   Procedure: LEFT HEART CATHETERIZATION WITH CORONARY ANGIOGRAM;  Surgeon: Burnell Blanks, MD;  Location: [facility] CATH LAB;  Service: Cardiovascular;  Laterality: N/A;    Home Medications    Prior to Admission medications   Medication Sig Start Date End Date Taking? Authorizing Provider  amLODipine (NORVASC) 10 MG tablet Take 1 tablet (10 mg total) by mouth daily. 06/13/12   Donney Dice, PA-C  aspirin 81 MG tablet Take 81 mg by mouth daily.    Historical Provider, MD  atorvastatin (LIPITOR) 80 MG tablet Take 80 mg by mouth at bedtime. 05/06/12   Brooke O Edmisten, PA-C  carvedilol (COREG) 25  MG tablet Take 1 tablet (25 mg total) by mouth 2 (two) times daily with a meal. 05/15/12   Evelene Croon Barrett, PA-C  clopidogrel (PLAVIX) 75 MG tablet TAKE 1 TABLET BY MOUTH  DAILY 02/18/16   Satira Sark, MD  esomeprazole (NEXIUM 24HR) 20 MG capsule Take 20 mg by mouth daily as needed (for acid reflux).    Historical Provider, MD  furosemide (LASIX) 20 MG tablet Take 1 tablet (20 mg total) by mouth every other day. 01/09/16   Satira Sark,  MD  glimepiride (AMARYL) 1 MG tablet Take 1 mg by mouth daily with breakfast.  03/03/12   Historical Provider, MD  isosorbide mononitrate (IMDUR) 30 MG 24 hr tablet Take 1 tablet (30 mg total) by mouth daily. 05/15/12   Rhonda G Barrett, PA-C  ketoconazole (NIZORAL) 2 % cream Apply 1 application topically 2 (two) times daily as needed (apply to face as needed (uses during the winter)). For dry skin    Historical Provider, MD  lisinopril (PRINIVIL,ZESTRIL) 40 MG tablet Take 1 tablet (40 mg total) by mouth daily. 05/15/12   Rhonda G Barrett, PA-C  metFORMIN (GLUCOPHAGE) 1000 MG tablet Take 1,000 mg by mouth 2 (two) times daily.  12/09/15   Historical Provider, MD  nitroGLYCERIN (NITROSTAT) 0.4 MG SL tablet Place 1 tablet (0.4 mg total) under the tongue every 5 (five) minutes x 3 doses as needed for chest pain. 05/23/13   Carlena Bjornstad, MD  potassium chloride SA (K-DUR,KLOR-CON) 20 MEQ tablet Take 1 tablet (20 mEq total) by mouth daily. 05/15/12   Rhonda G Barrett, PA-C  psyllium (METAMUCIL SMOOTH TEXTURE) 28 % packet Take 1 packet by mouth at bedtime. 03/19/16   Rogene Houston, MD  tamsulosin (FLOMAX) 0.4 MG CAPS capsule Take 0.4 mg by mouth 2 (two) times daily.     Historical Provider, MD   Family History Family History  Problem Relation Age of Onset  . Heart disease Mother   . Heart failure Mother   . Heart disease Father   . Heart attack Father   . Colon cancer Neg Hx     Social History Social History  Substance Use Topics  . Smoking status: Former Smoker    Types: Cigarettes    Start date: 01/25/1982    Quit date: 11/05/1988  . Smokeless tobacco: Never Used  . Alcohol use 0.0 oz/week     Comment: 11/05/13 Very seldom    Allergies   Penicillins and Contrast media [iodinated diagnostic agents]   Review of Systems Review of Systems  Constitutional: Negative for fever.  Respiratory: Positive for shortness of breath. Negative for cough.   Cardiovascular: Positive for chest pain.    Gastrointestinal: Positive for nausea. Negative for abdominal pain and vomiting.  Musculoskeletal: Negative for back pain.  Neurological: Positive for dizziness.  All other systems reviewed and are negative.   Physical Exam Updated Vital Signs BP (!) 149/84 (BP Location: Right Arm)   Pulse 79   Temp 97.6 F (36.4 C) (Oral)   Resp 18   Ht 5\' 10"  (1.778 m)   Wt 220 lb (99.8 kg)   SpO2 98%   BMI 31.57 kg/m   Physical Exam  Constitutional: He is oriented to person, place, and time. He appears well-developed and well-nourished.  HENT:  Head: Normocephalic.  Eyes: Conjunctivae are normal.  Cardiovascular: Normal rate and regular rhythm.   1-6/1 systolic murmur. Slight LLE edema, no pitting. No JVD.   Pulmonary/Chest: Effort normal and  breath sounds normal. No respiratory distress. He has no wheezes. He has no rales.  Abdominal: He exhibits no distension.  Musculoskeletal: Normal range of motion.  Neurological: He is alert and oriented to person, place, and time.  Skin: Skin is warm and dry.  Psychiatric: He has a normal mood and affect.  Nursing note and vitals reviewed.   ED Treatments / Results  DIAGNOSTIC STUDIES:  Oxygen Saturation is 98% on RA, normal by my interpretation.    COORDINATION OF CARE:  10:39 AM Discussed treatment plan with pt at bedside including EKG, CXR, NTG, and blood work and pt agreed to plan.  Labs (all labs ordered are listed, but only abnormal results are displayed) Labs Reviewed  BASIC METABOLIC PANEL  CBC  TROPONIN I    EKG  EKG Interpretation  Date/Time:  Thursday April 15 2016 09:40:11 EDT Ventricular Rate:  82 PR Interval:  146 QRS Duration: 70 QT Interval:  376 QTC Calculation: 439 R Axis:     Text Interpretation:  Normal sinus rhythm Minimal voltage criteria for LVH, may be normal variant Borderline ECG No significant change since last tracing Confirmed by [facility] MD, Orpah Hausner [phone]) on 04/15/2016 9:52:32 AM        Radiology Dg Chest 2 View  Result Date: 04/15/2016 CLINICAL DATA:  Left side chest pain for 1 week EXAM: CHEST  2 VIEW COMPARISON:  Report 05/11/2012 no images available FINDINGS: Cardiomediastinal silhouette is unremarkable. No infiltrate or pleural effusion. No pulmonary edema. Mild degenerative changes lower thoracic spine. IMPRESSION: No active cardiopulmonary disease. Electronically Signed   By: Lahoma Crocker M.D.   On: 04/15/2016 10:04    Procedures Procedures (including critical care time)  CRITICAL CARE Performed by: [name] Total critical care time: 35 minutes Critical care time was exclusive of separately billable procedures and treating other patients. Critical care was necessary to treat or prevent imminent or life-threatening deterioration. Critical care was time spent personally by me on the following activities: development of treatment plan with patient and/or surrogate as well as nursing, discussions with consultants, evaluation of patient's response to treatment, examination of patient, obtaining history from patient or surrogate, ordering and performing treatments and interventions, ordering and review of laboratory studies, ordering and review of radiographic studies, pulse oximetry and re-evaluation of patient's condition.   Medications Ordered in ED Medications  nitroGLYCERIN (NITROSTAT) SL tablet 0.4 mg (not administered)     Initial Impression / Assessment and Plan / ED Course  I have reviewed the triage vital signs and the nursing notes.  Pertinent labs & imaging results that were available during my care of the patient were reviewed by me and considered in my medical decision making (see chart for details).     Concern for likely unstable angina secondary to his symptoms. After discussion with cardiology, heparin started. Cardiology wants him to get a catheterization today so will be transferred [facility] for the same. Disposition per cardiology after  cath.   Final Clinical Impressions(s) / ED Diagnoses   Final diagnoses:  None    New Prescriptions New Prescriptions   No medications on file   I personally performed the services described in this documentation, which was scribed in my presence. The recorded information has been reviewed and is accurate.     [name], MD 04/15/16 [phone]

## 2016-04-15 NOTE — ED Notes (Signed)
ED Provider at bedside. 

## 2016-04-15 NOTE — ED Notes (Signed)
Pt PA pt is to stay NPO before procedure

## 2016-04-16 ENCOUNTER — Encounter (HOSPITAL_COMMUNITY): Payer: Self-pay | Admitting: Cardiology

## 2016-04-16 DIAGNOSIS — I2582 Chronic total occlusion of coronary artery: Secondary | ICD-10-CM | POA: Diagnosis not present

## 2016-04-16 DIAGNOSIS — I25110 Atherosclerotic heart disease of native coronary artery with unstable angina pectoris: Secondary | ICD-10-CM | POA: Diagnosis not present

## 2016-04-16 DIAGNOSIS — T82855A Stenosis of coronary artery stent, initial encounter: Secondary | ICD-10-CM | POA: Diagnosis not present

## 2016-04-16 DIAGNOSIS — E876 Hypokalemia: Secondary | ICD-10-CM | POA: Diagnosis not present

## 2016-04-16 DIAGNOSIS — I2584 Coronary atherosclerosis due to calcified coronary lesion: Secondary | ICD-10-CM | POA: Diagnosis not present

## 2016-04-16 DIAGNOSIS — K219 Gastro-esophageal reflux disease without esophagitis: Secondary | ICD-10-CM | POA: Diagnosis not present

## 2016-04-16 DIAGNOSIS — E669 Obesity, unspecified: Secondary | ICD-10-CM | POA: Diagnosis not present

## 2016-04-16 DIAGNOSIS — Z6831 Body mass index (BMI) 31.0-31.9, adult: Secondary | ICD-10-CM | POA: Diagnosis not present

## 2016-04-16 DIAGNOSIS — E785 Hyperlipidemia, unspecified: Secondary | ICD-10-CM | POA: Diagnosis not present

## 2016-04-16 DIAGNOSIS — I200 Unstable angina: Secondary | ICD-10-CM | POA: Diagnosis not present

## 2016-04-16 DIAGNOSIS — K921 Melena: Secondary | ICD-10-CM | POA: Diagnosis not present

## 2016-04-16 DIAGNOSIS — E119 Type 2 diabetes mellitus without complications: Secondary | ICD-10-CM | POA: Diagnosis not present

## 2016-04-16 DIAGNOSIS — I10 Essential (primary) hypertension: Secondary | ICD-10-CM | POA: Diagnosis not present

## 2016-04-16 LAB — BASIC METABOLIC PANEL
Anion gap: 12 (ref 5–15)
BUN: 10 mg/dL (ref 6–20)
CALCIUM: 8.1 mg/dL — AB (ref 8.9–10.3)
CO2: 24 mmol/L (ref 22–32)
CREATININE: 0.78 mg/dL (ref 0.61–1.24)
Chloride: 102 mmol/L (ref 101–111)
GFR calc Af Amer: >60 mL/min (ref >60)
GFR calc non Af Amer: >60 mL/min (ref >60)
GLUCOSE: 238 mg/dL — AB (ref 65–99)
Potassium: 3.6 mmol/L (ref 3.5–5.1)
Sodium: 138 mmol/L (ref 135–145)

## 2016-04-16 LAB — CBC
HCT: 39.9 % (ref 39.0–52.0)
HEMOGLOBIN: 13.7 g/dL (ref 13.0–17.0)
MCH: 30.4 pg (ref 26.0–34.0)
MCHC: 34.3 g/dL (ref 30.0–36.0)
MCV: 88.7 fL (ref 78.0–100.0)
PLATELETS: 169 10*3/uL (ref 150–400)
RBC: 4.5 MIL/uL (ref 4.22–5.81)
RDW: 12.8 % (ref 11.5–15.5)
WBC: 11.3 10*3/uL — AB (ref 4.0–10.5)

## 2016-04-16 LAB — POCT ACTIVATED CLOTTING TIME
ACTIVATED CLOTTING TIME: 307 s
Activated Clotting Time: 318 seconds

## 2016-04-16 LAB — GLUCOSE, CAPILLARY: GLUCOSE-CAPILLARY: 215 mg/dL — AB (ref 65–99)

## 2016-04-16 MED ORDER — METFORMIN HCL 1000 MG PO TABS
1000.0000 mg | ORAL_TABLET | Freq: Two times a day (BID) | ORAL | Status: DC
Start: 2016-04-18 — End: 2018-04-01

## 2016-04-16 NOTE — Discharge Summary (Signed)
Discharge Summary    Patient ID: Sean Villarreal,  MRN: [id number], DOB/AGE: 1940-05-29 76 y.o.  Admit date: 04/15/2016 Discharge date: 04/16/2016  Primary Care Provider: Rory Percy Primary Cardiologist: Dr. Domenic Polite   Discharge Diagnoses    Principal Problem:   Unstable angina [facility]) Active Problems:   Coronary artery disease   DM (diabetes mellitus) ([place])   Hypertension   Dyslipidemia   Allergies Allergies  Allergen Reactions  . Penicillins Other (See Comments)    Passed out as a child  . Contrast Media [Iodinated Diagnostic Agents] Other (See Comments)    Had "welts" on arm, and kidney issues after given dye in the past    Diagnostic Studies/Procedures    Procedures   Coronary Balloon Angioplasty  Left Heart Cath and Coronary Angiography  Conclusion     Prox RCA to Mid RCA lesion, 15 %stenosed.  Prox LAD to Mid LAD lesion, 0 %stenosed.  Ost LAD to Prox LAD lesion, 10 %stenosed.  Mid LAD to Dist LAD lesion, 60 %stenosed.  Ost Ramus lesion, 100 %stenosed.  Prox Cx to Mid Cx lesion, 70 %stenosed.  The left ventricular systolic function is normal.  LV end diastolic pressure is normal.  The left ventricular ejection fraction is 55-65% by visual estimate.  Ost Cx to Prox Cx lesion, 95 %stenosed.  Post intervention, there is a 15% residual stenosis.  1. 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 Moderate disease in the mid LCx and distal LAD. 2. Normal LV function 3. Normal LVEDP 4. Successful POBA of the ostium of the LCx       History of Present Illness     76 y.o.malewith a hx of CAD status post DES to the LAD and circumflex in 2014, admitted for elective cath given recurrent unstable angina.   Hospital Course     1. CAD/Unstable Angina: LHC 04/16/16, performed by Dr. Martinique, demonstrated single vessel obstructive CAD with critical in-stent restenosis of the ostium of the LCx, moderate  disease in the mid LCx and distal LAD. Normal EF. S/p successful POBA of the ostium of the LCx. No stent placement. Medical therapy for residual disease. He tolerated the procedure well and left the cath lab in stable condition. He had no post cath complications and no recurrent CP. Renal function and left radial cath site both remained stable. He had no difficulties ambulating with cardiac rehab. He was continued on DAPT w/ ASA + Plavix, BB, statin, nitrate and ACE-I. He was instructed to hold metformin for 48 hrs. He was last seen and examined by Dr. Angelena Form, who determined he was stable for d/c home. He will f/u with Dr. Domenic Polite in [place].   Consultants: none   Discharge Vitals Blood pressure (!) 149/84, pulse 87, temperature 97.9 F (36.6 C), temperature source Oral, resp. rate 17, height 5\' 10"  (1.778 m), weight 213 lb 3 oz (96.7 kg), SpO2 96 %.  Filed Weights   04/15/16 0946 04/16/16 0152  Weight: 220 lb (99.8 kg) 213 lb 3 oz (96.7 kg)    Labs & Radiologic Studies    CBC  Recent Labs  04/15/16 1014 04/16/16 0139  WBC 10.5 11.3*  HGB 15.7 13.7  HCT 45.8 39.9  MCV 90.0 88.7  PLT 186 709   Basic Metabolic Panel  Recent Labs  04/15/16 1014 04/16/16 0139  NA 136 138  K 3.3* 3.6  CL 98* 102  CO2 26 24  GLUCOSE 202*  238*  BUN 9 10  CREATININE 0.67 0.78  CALCIUM 9.0 8.1*   Liver Function Tests No results for input(s): AST, ALT, ALKPHOS, BILITOT, PROT, ALBUMIN in the last 72 hours. No results for input(s): LIPASE, AMYLASE in the last 72 hours. Cardiac Enzymes  Recent Labs  04/15/16 1014  TROPONINI <0.03   BNP Invalid input(s): POCBNP D-Dimer No results for input(s): DDIMER in the last 72 hours. Hemoglobin A1C No results for input(s): HGBA1C in the last 72 hours. Fasting Lipid Panel No results for input(s): CHOL, HDL, LDLCALC, TRIG, CHOLHDL, LDLDIRECT in the last 72 hours. Thyroid Function Tests No results for input(s): TSH, T4TOTAL, T3FREE, THYROIDAB in the  last 72 hours.  Invalid input(s): FREET3 _____________  Dg Chest 2 View  Result Date: 04/15/2016 CLINICAL DATA:  Left side chest pain for 1 week EXAM: CHEST  2 VIEW COMPARISON:  Report 05/11/2012 no images available FINDINGS: Cardiomediastinal silhouette is unremarkable. No infiltrate or pleural effusion. No pulmonary edema. Mild degenerative changes lower thoracic spine. IMPRESSION: No active cardiopulmonary disease. Electronically Signed   By: Lahoma Crocker M.D.   On: 04/15/2016 10:04   Disposition   Pt is being discharged home today in good condition.  Follow-up Plans & Appointments    Follow-up Information    Rozann Lesches, MD Follow up.   Specialty:  Cardiology Why:  our office will call you with a hospital follow-up visit in 1-2 weeks Contact information: [place] [address] [phone]        Rozann Lesches, MD Follow up.   Specialty:  Cardiology Contact information: [place] [address] [phone]          Discharge Instructions    AMB Referral to Cardiac Rehabilitation - Phase II    Complete by:  As directed    Diagnosis:  PTCA   Diet - low sodium heart healthy    Complete by:  As directed    Increase activity slowly    Complete by:  As directed       Discharge Medications   Current Discharge Medication List    CONTINUE these medications which have CHANGED   Details  metFORMIN (GLUCOPHAGE) 1000 MG tablet Take 1 tablet (1,000 mg total) by mouth 2 (two) times daily.      CONTINUE these medications which have NOT CHANGED   Details  amLODipine (NORVASC) 10 MG tablet Take 1 tablet (10 mg total) by mouth daily. Qty: 30 tablet, Refills: 3    aspirin 81 MG tablet Take 81 mg by mouth daily.    atorvastatin (LIPITOR) 80 MG tablet Take 80 mg by mouth at bedtime.    carvedilol (COREG) 25 MG tablet Take 1 tablet (25 mg total) by mouth 2 (two) times daily with a meal. Qty: 60 tablet, Refills: 11    clopidogrel (PLAVIX)  75 MG tablet TAKE 1 TABLET BY MOUTH  DAILY Qty: 90 tablet, Refills: 1    esomeprazole (NEXIUM 24HR) 20 MG capsule Take 20 mg by mouth daily as needed (for acid reflux).    furosemide (LASIX) 20 MG tablet Take 1 tablet (20 mg total) by mouth every other day. Qty: 45 tablet, Refills: 3    glimepiride (AMARYL) 1 MG tablet Take 1 mg by mouth daily with breakfast.     isosorbide mononitrate (IMDUR) 30 MG 24 hr tablet Take 1 tablet (30 mg total) by mouth daily. Qty: 30 tablet, Refills: 11    ketoconazole (NIZORAL) 2 % cream  Apply 1 application topically 2 (two) times daily as needed (apply to face as needed (uses during the winter)). For dry skin    lisinopril (PRINIVIL,ZESTRIL) 40 MG tablet Take 1 tablet (40 mg total) by mouth daily. Qty: 30 tablet, Refills: 3    nitroGLYCERIN (NITROSTAT) 0.4 MG SL tablet Place 1 tablet (0.4 mg total) under the tongue every 5 (five) minutes x 3 doses as needed for chest pain. Qty: 30 tablet, Refills: 3    potassium chloride SA (K-DUR,KLOR-CON) 20 MEQ tablet Take 1 tablet (20 mEq total) by mouth daily. Qty: 30 tablet, Refills: 11    psyllium (METAMUCIL SMOOTH TEXTURE) 28 % packet Take 1 packet by mouth at bedtime.    tamsulosin (FLOMAX) 0.4 MG CAPS capsule Take 0.4 mg by mouth 2 (two) times daily.          Aspirin prescribed at discharge?  Yes High Intensity Statin Prescribed? (Lipitor 40-80mg  or Crestor 20-40mg ): Yes Beta Blocker Prescribed? Yes For EF <40%, was ACEI/ARB Prescribed? Yes ADP Receptor Inhibitor Prescribed? (i.e. Plavix etc.-Includes Medically Managed Patients): Yes For EF <40%, Aldosterone Inhibitor Prescribed? No: EF >40% Was EF assessed during THIS hospitalization? Yes Was Cardiac Rehab II ordered? (Included Medically managed Patients): Yes   Outstanding Labs/Studies   None   Duration of Discharge Encounter   Greater than 30 minutes including physician time.  Signed, Lyda Jester PA-C 04/16/2016, 8:32 AM

## 2016-04-16 NOTE — Care Management Obs Status (Signed)
[place] NOTIFICATION   Patient Details  Name: AIRON SAHNI MRN: [id number] Date of Birth: July 25, 1940   Medicare Observation Status Notification Given:  Yes    Zenon Mayo, RN 04/16/2016, 9:25 AM

## 2016-04-16 NOTE — Progress Notes (Addendum)
Progress Note  Patient Name: Sean Villarreal Date of Encounter: 04/16/2016  Primary Cardiologist: Dr. Domenic Polite   Subjective   Doing well this morning. No chest pain or dyspnea. Left radial cath site stable.   Inpatient Medications    Scheduled Meds: . amLODipine  10 mg Oral Daily  . aspirin  81 mg Oral Pre-Cath  . [START ON 04/17/2016] aspirin  81 mg Oral Daily  . atorvastatin  80 mg Oral QHS  . carvedilol  25 mg Oral BID WC  . clopidogrel  75 mg Oral Daily  . furosemide  20 mg Oral QODAY  . insulin aspart  0-5 Units Subcutaneous QHS  . insulin aspart  0-9 Units Subcutaneous TID WC  . isosorbide mononitrate  30 mg Oral Daily  . lisinopril  40 mg Oral Daily  . pantoprazole  40 mg Oral Daily  . potassium chloride SA  20 mEq Oral Daily  . psyllium  1 packet Oral QHS  . sodium chloride flush  3 mL Intravenous Q12H  . tamsulosin  0.4 mg Oral BID   Continuous Infusions: . sodium chloride     PRN Meds: sodium chloride, acetaminophen, ketoconazole, nitroGLYCERIN, nitroGLYCERIN, ondansetron (ZOFRAN) IV, sodium chloride flush   Vital Signs    Vitals:   04/15/16 1911 04/15/16 2000 04/15/16 2100 04/16/16 0152  BP: (!) 155/87 (!) 158/78 (!) 141/77 (!) 149/84  Pulse: 95 88 94 87  Resp: 20 (!) 27 (!) 21 17  Temp: 97.5 F (36.4 C)   97.9 F (36.6 C)  TempSrc: Axillary   Oral  SpO2: 96% 96% 97% 96%  Weight:    213 lb 3 oz (96.7 kg)  Height:        Intake/Output Summary (Last 24 hours) at 04/16/16 0719 Last data filed at 04/16/16 0600  Gross per 24 hour  Intake          1163.15 ml  Output             1175 ml  Net           -11.85 ml   Filed Weights   04/15/16 0946 04/16/16 0152  Weight: 220 lb (99.8 kg) 213 lb 3 oz (96.7 kg)    Telemetry    NSR - Personally Reviewed  ECG    NSR - Personally Reviewed  Physical Exam   GEN: No acute distress.   Neck: No JVD Cardiac: RRR, no murmurs, rubs, or gallops.  Respiratory: Clear to auscultation bilaterally. GI:  Soft, nontender, non-distended  MS: No edema; No deformity. Neuro:  Nonfocal  Psych: Normal affect   Labs    Chemistry Recent Labs Lab 04/15/16 1014 04/16/16 0139  NA 136 138  K 3.3* 3.6  CL 98* 102  CO2 26 24  GLUCOSE 202* 238*  BUN 9 10  CREATININE 0.67 0.78  CALCIUM 9.0 8.1*  GFRNONAA >60 >60  GFRAA >60 >60  ANIONGAP 12 12     Hematology Recent Labs Lab 04/15/16 1014 04/16/16 0139  WBC 10.5 11.3*  RBC 5.09 4.50  HGB 15.7 13.7  HCT 45.8 39.9  MCV 90.0 88.7  MCH 30.8 30.4  MCHC 34.3 34.3  RDW 12.9 12.8  PLT 186 169    Cardiac Enzymes Recent Labs Lab 04/15/16 1014  TROPONINI <0.03   No results for input(s): TROPIPOC in the last 168 hours.   BNPNo results for input(s): BNP, PROBNP in the last 168 hours.   DDimer No results for input(s): DDIMER in the last  168 hours.   Radiology    Dg Chest 2 View  Result Date: 04/15/2016 CLINICAL DATA:  Left side chest pain for 1 week EXAM: CHEST  2 VIEW COMPARISON:  Report 05/11/2012 no images available FINDINGS: Cardiomediastinal silhouette is unremarkable. No infiltrate or pleural effusion. No pulmonary edema. Mild degenerative changes lower thoracic spine. IMPRESSION: No active cardiopulmonary disease. Electronically Signed   By: Lahoma Crocker M.D.   On: 04/15/2016 10:04    Cardiac Studies   Procedures   Coronary Balloon Angioplasty  Left Heart Cath and Coronary Angiography  Conclusion     Prox RCA to Mid RCA lesion, 15 %stenosed.  Prox LAD to Mid LAD lesion, 0 %stenosed.  Ost LAD to Prox LAD lesion, 10 %stenosed.  Mid LAD to Dist LAD lesion, 60 %stenosed.  Ost Ramus lesion, 100 %stenosed.  Prox Cx to Mid Cx lesion, 70 %stenosed.  The left ventricular systolic function is normal.  LV end diastolic pressure is normal.  The left ventricular ejection fraction is 55-65% by visual estimate.  Ost Cx to Prox Cx lesion, 95 %stenosed.  Post intervention, there is a 15% residual stenosis.   1. Single  vessel obstructive CAD with critical in stent restenosis of the ostium of the LCx. Moderate disease in the mid LCx and distal LAD. 2. Normal LV function 3. Normal LVEDP 4. Successful POBA of the ostium of the LCx      Patient Profile     76 y.o. male with a hx of CAD status post DES to the LAD and circumflex in 2014, admitted for elective cath given recurrent unstable angina.   Assessment & Plan    1. CAD/Unstable Angina: LHC 04/16/16 demonstrated single vessel obstructive CAD with critical in-stent restenosis of the ostium of the LCx, moderate disease in the mid LCx and distal LAD. Normal EF. S/p successful POBA of the ostium of the LCx. No stent placement. Medical therapy for residual disease. He is CP free. Continue DAPT w/ ASA + Plavix, BB, statin, nitrate and ACE-I. Hold metformin for 48 hrs post cath. Renal function and left radial cath site stable. Ambulate with cardiac rehab today. Plan for d/c home today. We will arrange f/u in [facility] with Dr. Domenic Polite or APP in 1-2 weeks.   Signed, Lyda Jester, PA-C  04/16/2016, 7:19 AM    I have personally seen and examined this patient with Lyda Jester, PA-C. I agree with the assessment and plan as outlined above. REcent unstable angina. Cardiac cath yesterday per Dr. Martinique with PCI of the ostial Circumflex, balloon angioplasty only. Doing well this am. Continue DAPT with ASA and Plavix for at least one month but longer if tolerated. D/C home today. F/U Dr. Domenic Polite.   Lauree Chandler 04/16/2016 8:02 AM'

## 2016-04-16 NOTE — Care Management Note (Addendum)
Case Management   Patient Details  Name: Sean Villarreal MRN: [id number] Date of Birth: [birth year]  Subjective/Objective:   s/p Coronary Balloon Angioplasty , on plavix, pta indep, has medication coverage, transportation and a PCP,  for dc today, no needs.                Action/Plan:   Expected Discharge Date:  04/16/16               Expected Discharge Plan:  Home/Self Care  In-House Referral:     Discharge planning Services  CM Consult  Post Acute Care Choice:    Choice offered to:     DME Arranged:    DME Agency:     HH Arranged:    HH Agency:     Status of Service:  Completed, signed off  If discussed at [employer] of Stay Meetings, dates discussed:    Additional Comments:  Zenon Mayo, RN 04/16/2016, 9:19 AM

## 2016-04-16 NOTE — Progress Notes (Signed)
CARDIAC REHAB PHASE I   PRE:  Rate/Rhythm: 97 SR  BP:  Sitting: 137/68        SaO2: 99 RA  MODE:  Ambulation: 500 ft   POST:  Rate/Rhythm: 120 ST  BP:  Sitting: 176/84         SaO2: 98 RA  Pt ambulated 500 ft on RA, independent, steady gait, tolerated well with no complaints. Completed PCI education.  Reviewed risk factors, anti-platelet therapy, PCI book, activity restrictions, ntg, exercise, heart healthy diet, carb counting, and phase 2 cardiac rehab. Pt verbalized understanding. Pt agrees to phase 2 cardiac rehab referral, will send to [facility] per pt request. Pt to edge of bed per pt request after walk, call bell within reach.   [phone] Lenna Sciara, RN, BSN 04/16/2016 8:41 AM

## 2016-04-23 ENCOUNTER — Ambulatory Visit (INDEPENDENT_AMBULATORY_CARE_PROVIDER_SITE_OTHER): Payer: Medicare Other | Admitting: Cardiology

## 2016-04-23 ENCOUNTER — Encounter: Payer: Self-pay | Admitting: Cardiology

## 2016-04-23 VITALS — BP 123/69 | HR 81 | Ht 70.0 in | Wt 217.0 lb

## 2016-04-23 DIAGNOSIS — I2510 Atherosclerotic heart disease of native coronary artery without angina pectoris: Secondary | ICD-10-CM | POA: Diagnosis not present

## 2016-04-23 DIAGNOSIS — I200 Unstable angina: Secondary | ICD-10-CM | POA: Diagnosis not present

## 2016-04-23 DIAGNOSIS — E782 Mixed hyperlipidemia: Secondary | ICD-10-CM

## 2016-04-23 DIAGNOSIS — I10 Essential (primary) hypertension: Secondary | ICD-10-CM

## 2016-04-23 DIAGNOSIS — E118 Type 2 diabetes mellitus with unspecified complications: Secondary | ICD-10-CM

## 2016-04-23 MED ORDER — NITROGLYCERIN 0.4 MG SL SUBL: 0.4000 mg | SUBLINGUAL_TABLET | SUBLINGUAL | 3 refills | Status: DC | PRN

## 2016-04-23 NOTE — Patient Instructions (Signed)

## 2016-04-23 NOTE — Progress Notes (Signed)
Cardiology Office Note  Date: 04/23/2016   ID: [name], DOB 15-Jul-1940, MRN [id number]  PCP: Sean Percy, MD  Primary Cardiologist: Sean Lesches, MD   Chief Complaint  Patient presents with  . Hospitalization Follow-up  . Coronary Artery Disease    History of Present Illness: Sean Villarreal is a 76 y.o. male last seen in the office back in December 2017. I saw him just recently at [place] with symptoms concerning for unstable angina and transferred him to [facility] for a cardiac catheterization. Procedure was performed by Dr. Martinique revealing critical in-stent restenosis at the ostium of the left circumflex with moderate disease in the mid circumflex and distal LAD. He underwent angioplasty to the ostial circumflex. LVEF was normal at 55-65% by ventriculography.  He presents today for follow-up, states that he feels much better. Essentially back to baseline. He has walked a mile each day the last few days and feels well. We went over his medications. He reports compliance in no obvious side effects. Did provide a refill for a fresh bottle of nitroglycerin.  Past Medical History:  Diagnosis Date  . Arthritis   . Basal cell carcinoma   . Coronary artery disease    DES to LAD and circumflex April 2014, PTCA ostial circumflex 03/2016 due to ISR  . Dyslipidemia   . Essential hypertension   . GERD (gastroesophageal reflux disease)   . Hematuria   . History of blood transfusion 09/2012  . History of kidney stones   . NSTEMI (non-ST elevated myocardial infarction) ([place]) 08/2012  . Type II diabetes mellitus ([place])     Past Surgical History:  Procedure Laterality Date  . BASAL CELL CARCINOMA EXCISION     "right cheek; both shoulders"  . CARDIAC CATHETERIZATION    . CATARACT EXTRACTION W/ INTRAOCULAR LENS  IMPLANT, BILATERAL Bilateral   . COLONOSCOPY N/A 03/19/2016   Procedure: COLONOSCOPY;  Surgeon: Rogene Houston, MD;  Location[facility];   Service: Endoscopy;  Laterality: N/A;  9:15  . CORONARY BALLOON ANGIOPLASTY N/A 04/15/2016   Procedure: Coronary Balloon Angioplasty;  Surgeon: Peter M Martinique, MD;  Location: [facility] CV LAB;  Service: Cardiovascular;  Laterality: N/A;  . CYSTOSCOPY W/ URETEROSCOPY W/ LITHOTRIPSY  10/2012   Archie Endo 11/10/2012  . CYSTOSCOPY WITH STENT PLACEMENT  08/2012; 09/2012  . EYE SURGERY Left ~ 02/2016   "for crinkled lens"  . LEFT HEART CATH AND CORONARY ANGIOGRAPHY N/A 04/15/2016   Procedure: Left Heart Cath and Coronary Angiography;  Surgeon: Peter M Martinique, MD;  Location: [facility] CV LAB;  Service: Cardiovascular;  Laterality: N/A;  . LEFT HEART CATHETERIZATION WITH CORONARY ANGIOGRAM N/A 05/05/2012   Procedure: LEFT HEART CATHETERIZATION WITH CORONARY ANGIOGRAM;  Surgeon: Burnell Blanks, MD;  Location[facility] CATH LAB;  Service: Cardiovascular;  Laterality: N/A;  . LEFT HEART CATHETERIZATION WITH CORONARY ANGIOGRAM N/A 05/15/2012   Procedure: LEFT HEART CATHETERIZATION WITH CORONARY ANGIOGRAM;  Surgeon: Burnell Blanks, MD;  Location: [facility] CATH LAB;  Service: Cardiovascular;  Laterality: N/A;  . TONSILLECTOMY  1947    Current Outpatient Prescriptions  Medication Sig Dispense Refill  . amLODipine (NORVASC) 10 MG tablet Take 1 tablet (10 mg total) by mouth daily. 30 tablet 3  . aspirin 81 MG tablet Take 81 mg by mouth daily.    [name] atorvastatin (LIPITOR) 80 MG tablet Take 80 mg by mouth at bedtime.    . carvedilol (COREG) 25 MG tablet Take 1 tablet (  25 mg total) by mouth 2 (two) times daily with a meal. 60 tablet 11  . clopidogrel (PLAVIX) 75 MG tablet TAKE 1 TABLET BY MOUTH  DAILY 90 tablet 1  . esomeprazole (NEXIUM 24HR) 20 MG capsule Take 20 mg by mouth daily as needed (for acid reflux).    . furosemide (LASIX) 20 MG tablet Take 1 tablet (20 mg total) by mouth every other day. 45 tablet 3  . glimepiride (AMARYL) 1 MG tablet Take 1 mg by mouth daily with breakfast.     . isosorbide  mononitrate (IMDUR) 30 MG 24 hr tablet Take 1 tablet (30 mg total) by mouth daily. 30 tablet 11  . ketoconazole (NIZORAL) 2 % cream Apply 1 application topically 2 (two) times daily as needed (apply to face as needed (uses during the winter)). For dry skin    . lisinopril (PRINIVIL,ZESTRIL) 40 MG tablet Take 1 tablet (40 mg total) by mouth daily. 30 tablet 3  . metFORMIN (GLUCOPHAGE) 1000 MG tablet Take 1 tablet (1,000 mg total) by mouth 2 (two) times daily.    . nitroGLYCERIN (NITROSTAT) 0.4 MG SL tablet Place 1 tablet (0.4 mg total) under the tongue every 5 (five) minutes x 3 doses as needed for chest pain. 25 tablet 3  . potassium chloride SA (K-DUR,KLOR-CON) 20 MEQ tablet Take 1 tablet (20 mEq total) by mouth daily. 30 tablet 11  . psyllium (METAMUCIL SMOOTH TEXTURE) 28 % packet Take 1 packet by mouth at bedtime.    . tamsulosin (FLOMAX) 0.4 MG CAPS capsule Take 0.4 mg by mouth 2 (two) times daily.      No current facility-administered medications for this visit.    Allergies:  Penicillins and Contrast media [iodinated diagnostic agents]   Social History: The patient  reports that he quit smoking about 27 years ago. His smoking use included Cigarettes. He started smoking about 34 years ago. He has a 22.50 pack-year smoking history. He has never used smokeless tobacco. He reports that he drinks alcohol. He reports that he does not use drugs.   ROS:  Please see the history of present illness. Otherwise, complete review of systems is positive for none.  All other systems are reviewed and negative.   Physical Exam: VS:  BP 123/69 (BP Location: Left Arm, Patient Position: Sitting, Cuff Size: Normal)   Pulse 81   Ht 5\' 10"  (1.778 m)   Wt 217 lb (98.4 kg)   SpO2 97%   BMI 31.14 kg/m , BMI Body mass index is 31.14 kg/m.  Wt Readings from Last 3 Encounters:  04/23/16 217 lb (98.4 kg)  04/16/16 213 lb 3 oz (96.7 kg)  03/19/16 220 lb (99.8 kg)    General: Overweight male, appears comfortable  at rest. HEENT: Conjunctiva and lids normal, oropharynx clear. Neck: Supple, no elevated JVP or carotid bruits, no thyromegaly. Lungs: Clear to auscultation, nonlabored breathing at rest. Cardiac: Regular rate and rhythm, no S3, soft systolic murmur. Abdomen: Soft, nontender, bowel sounds present. Extremities: Trace ankle edema, distal pulses 2+. Skin: Warm and dry. Musculoskeletal: No kyphosis. Neuropsychiatric: Alert and oriented x3, affect grossly appropriate.  ECG: I personally reviewed the tracing from 04/15/2016 which showed normal sinus rhythm with increased voltage.  Recent Labwork: 04/16/2016: BUN 10; Creatinine, Ser 0.78; Hemoglobin 13.7; Platelets 169; Potassium 3.6; Sodium 138     Component Value Date/Time   CHOL 126 05/06/2012 0500   TRIG 104 05/06/2012 0500   HDL 45 05/06/2012 0500   CHOLHDL 2.8 05/06/2012 0500  VLDL 21 05/06/2012 0500   LDLCALC 60 05/06/2012 0500    Other Studies Reviewed Today:  Cardiac catheterization 04/15/2016:  Prox RCA to Mid RCA lesion, 15 %stenosed.  Prox LAD to Mid LAD lesion, 0 %stenosed.  Ost LAD to Prox LAD lesion, 10 %stenosed.  Mid LAD to Dist LAD lesion, 60 %stenosed.  Ost Ramus lesion, 100 %stenosed.  Prox Cx to Mid Cx lesion, 70 %stenosed.  The left ventricular systolic function is normal.  LV end diastolic pressure is normal.  The left ventricular ejection fraction is 55-65% by visual estimate.  Ost Cx to Prox Cx lesion, 95 %stenosed.  Post intervention, there is a 15% residual stenosis.   1. 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 Moderate disease in the mid LCx and distal LAD. 2. Normal LV function 3. Normal LVEDP 4. Successful POBA of the ostium of the LCx   Assessment and Plan:  1. CAD status post recent presentation with unstable angina with finding of critical in-stent restenosis involving the ostial circumflex, now status post angioplasty by Dr. Martinique and  feeling much better. He has moderate disease in the mid circumflex and distal LAD that will be managed medically. Continue with regular exercise. He remains on the DAPT.  2. Essential hypertension, blood pressure is adequately controlled today on current regimen. No changes were made.  3. Hyperlipidemia, remains on Lipitor with follow-up per Dr. Nadara Mustard.  4. Type 2 diabetes mellitus, on Amaryl and Glucophage, followed by Dr. Nadara Mustard.  Current medicines were reviewed with the patient today.  Disposition: Follow-up in 6 months.  Signed, Satira Sark, MD, [facility] 04/23/2016 10:55 AM    [facility], [place], [place] [address] Phone: [phone]; Fax: [phone]

## 2016-05-07 ENCOUNTER — Encounter: Payer: Medicare Other | Admitting: Cardiology

## 2016-06-02 DIAGNOSIS — E1165 Type 2 diabetes mellitus with hyperglycemia: Secondary | ICD-10-CM | POA: Diagnosis not present

## 2016-06-02 DIAGNOSIS — M79671 Pain in right foot: Secondary | ICD-10-CM | POA: Diagnosis not present

## 2016-06-02 DIAGNOSIS — M25551 Pain in right hip: Secondary | ICD-10-CM | POA: Diagnosis not present

## 2016-06-02 DIAGNOSIS — Z6832 Body mass index (BMI) 32.0-32.9, adult: Secondary | ICD-10-CM | POA: Diagnosis not present

## 2016-06-02 DIAGNOSIS — I2510 Atherosclerotic heart disease of native coronary artery without angina pectoris: Secondary | ICD-10-CM | POA: Diagnosis not present

## 2016-06-02 DIAGNOSIS — I10 Essential (primary) hypertension: Secondary | ICD-10-CM | POA: Diagnosis not present

## 2016-10-09 ENCOUNTER — Inpatient Hospital Stay (HOSPITAL_COMMUNITY)
Admission: AD | Admit: 2016-10-09 | Discharge: 2016-10-12 | DRG: 247 | Disposition: A | Discharge status: Home / Self Care | Payer: Medicare Other | Source: Other Acute Inpatient Hospital | Attending: Cardiovascular Disease | Admitting: Cardiovascular Disease

## 2016-10-09 DIAGNOSIS — Z955 Presence of coronary angioplasty implant and graft: Secondary | ICD-10-CM | POA: Diagnosis not present

## 2016-10-09 DIAGNOSIS — I10 Essential (primary) hypertension: Secondary | ICD-10-CM | POA: Diagnosis not present

## 2016-10-09 DIAGNOSIS — E876 Hypokalemia: Secondary | ICD-10-CM | POA: Diagnosis not present

## 2016-10-09 DIAGNOSIS — Z23 Encounter for immunization: Secondary | ICD-10-CM

## 2016-10-09 DIAGNOSIS — I110 Hypertensive heart disease with heart failure: Secondary | ICD-10-CM | POA: Diagnosis not present

## 2016-10-09 DIAGNOSIS — K219 Gastro-esophageal reflux disease without esophagitis: Secondary | ICD-10-CM | POA: Diagnosis present

## 2016-10-09 DIAGNOSIS — I25110 Atherosclerotic heart disease of native coronary artery with unstable angina pectoris: Secondary | ICD-10-CM | POA: Diagnosis not present

## 2016-10-09 DIAGNOSIS — I252 Old myocardial infarction: Secondary | ICD-10-CM | POA: Diagnosis not present

## 2016-10-09 DIAGNOSIS — I214 Non-ST elevation (NSTEMI) myocardial infarction: Secondary | ICD-10-CM | POA: Diagnosis not present

## 2016-10-09 DIAGNOSIS — Z8249 Family history of ischemic heart disease and other diseases of the circulatory system: Secondary | ICD-10-CM

## 2016-10-09 DIAGNOSIS — R110 Nausea: Secondary | ICD-10-CM | POA: Diagnosis not present

## 2016-10-09 DIAGNOSIS — R0602 Shortness of breath: Secondary | ICD-10-CM | POA: Diagnosis not present

## 2016-10-09 DIAGNOSIS — Z880 Allergy status to penicillin: Secondary | ICD-10-CM | POA: Diagnosis not present

## 2016-10-09 DIAGNOSIS — Z85828 Personal history of other malignant neoplasm of skin: Secondary | ICD-10-CM | POA: Diagnosis not present

## 2016-10-09 DIAGNOSIS — Y831 Surgical operation with implant of artificial internal device as the cause of abnormal reaction of the patient, or of later complication, without mention of misadventure at the time of the procedure: Secondary | ICD-10-CM | POA: Diagnosis present

## 2016-10-09 DIAGNOSIS — E785 Hyperlipidemia, unspecified: Secondary | ICD-10-CM | POA: Diagnosis not present

## 2016-10-09 DIAGNOSIS — E7800 Pure hypercholesterolemia, unspecified: Secondary | ICD-10-CM | POA: Diagnosis not present

## 2016-10-09 DIAGNOSIS — R748 Abnormal levels of other serum enzymes: Secondary | ICD-10-CM | POA: Diagnosis not present

## 2016-10-09 DIAGNOSIS — E119 Type 2 diabetes mellitus without complications: Secondary | ICD-10-CM | POA: Diagnosis not present

## 2016-10-09 DIAGNOSIS — I5032 Chronic diastolic (congestive) heart failure: Secondary | ICD-10-CM | POA: Diagnosis not present

## 2016-10-09 DIAGNOSIS — I2510 Atherosclerotic heart disease of native coronary artery without angina pectoris: Secondary | ICD-10-CM | POA: Diagnosis not present

## 2016-10-09 DIAGNOSIS — R079 Chest pain, unspecified: Secondary | ICD-10-CM | POA: Diagnosis not present

## 2016-10-09 DIAGNOSIS — T82855A Stenosis of coronary artery stent, initial encounter: Secondary | ICD-10-CM | POA: Diagnosis not present

## 2016-10-09 DIAGNOSIS — Z87891 Personal history of nicotine dependence: Secondary | ICD-10-CM | POA: Diagnosis not present

## 2016-10-09 DIAGNOSIS — I209 Angina pectoris, unspecified: Secondary | ICD-10-CM | POA: Diagnosis not present

## 2016-10-09 DIAGNOSIS — Z7982 Long term (current) use of aspirin: Secondary | ICD-10-CM | POA: Diagnosis not present

## 2016-10-09 DIAGNOSIS — Z79899 Other long term (current) drug therapy: Secondary | ICD-10-CM | POA: Diagnosis not present

## 2016-10-09 DIAGNOSIS — I200 Unstable angina: Secondary | ICD-10-CM | POA: Diagnosis not present

## 2016-10-09 DIAGNOSIS — I350 Nonrheumatic aortic (valve) stenosis: Secondary | ICD-10-CM | POA: Diagnosis not present

## 2016-10-09 DIAGNOSIS — Z91041 Radiographic dye allergy status: Secondary | ICD-10-CM | POA: Diagnosis not present

## 2016-10-09 DIAGNOSIS — Z7984 Long term (current) use of oral hypoglycemic drugs: Secondary | ICD-10-CM | POA: Diagnosis not present

## 2016-10-09 LAB — CBC WITH DIFFERENTIAL/PLATELET
BASOS ABS: 0 10*3/uL (ref 0.0–0.1)
BASOS PCT: 0 %
EOS ABS: 0.2 10*3/uL (ref 0.0–0.7)
Eosinophils Relative: 2 %
HCT: 41.7 % (ref 39.0–52.0)
Hemoglobin: 13.8 g/dL (ref 13.0–17.0)
Lymphocytes Relative: 22 %
Lymphs Abs: 2.5 10*3/uL (ref 0.7–4.0)
MCH: 30.1 pg (ref 26.0–34.0)
MCHC: 33.1 g/dL (ref 30.0–36.0)
MCV: 90.8 fL (ref 78.0–100.0)
MONO ABS: 1.2 10*3/uL — AB (ref 0.1–1.0)
MONOS PCT: 11 %
NEUTROS PCT: 65 %
Neutro Abs: 7.4 10*3/uL (ref 1.7–7.7)
Platelets: 185 10*3/uL (ref 150–400)
RBC: 4.59 MIL/uL (ref 4.22–5.81)
RDW: 12.4 % (ref 11.5–15.5)
WBC: 11.2 10*3/uL — ABNORMAL HIGH (ref 4.0–10.5)

## 2016-10-09 LAB — TSH: TSH: 2.786 u[IU]/mL (ref 0.350–4.500)

## 2016-10-09 LAB — COMPREHENSIVE METABOLIC PANEL
ALBUMIN: 3.3 g/dL — AB (ref 3.5–5.0)
ALK PHOS: 41 U/L (ref 38–126)
ALT: 19 U/L (ref 17–63)
AST: 20 U/L (ref 15–41)
Anion gap: 9 (ref 5–15)
BUN: 10 mg/dL (ref 6–20)
CALCIUM: 8.6 mg/dL — AB (ref 8.9–10.3)
CHLORIDE: 100 mmol/L — AB (ref 101–111)
CO2: 31 mmol/L (ref 22–32)
CREATININE: 0.84 mg/dL (ref 0.61–1.24)
GFR calc non Af Amer: >60 mL/min (ref >60)
GLUCOSE: 167 mg/dL — AB (ref 65–99)
Potassium: 3.1 mmol/L — ABNORMAL LOW (ref 3.5–5.1)
Sodium: 140 mmol/L (ref 135–145)
Total Bilirubin: 0.9 mg/dL (ref 0.3–1.2)
Total Protein: 6.7 g/dL (ref 6.5–8.1)

## 2016-10-09 LAB — HEMOGLOBIN A1C
HEMOGLOBIN A1C: 7.2 % — AB (ref 4.8–5.6)
MEAN PLASMA GLUCOSE: 159.94 mg/dL

## 2016-10-09 LAB — T4, FREE: FREE T4: 1.24 ng/dL — AB (ref 0.61–1.12)

## 2016-10-09 LAB — BRAIN NATRIURETIC PEPTIDE: B NATRIURETIC PEPTIDE 5: 126.4 pg/mL — AB (ref 0.0–100.0)

## 2016-10-09 LAB — MAGNESIUM: Magnesium: 1.1 mg/dL — ABNORMAL LOW (ref 1.7–2.4)

## 2016-10-09 LAB — TROPONIN I: Troponin I: 0.46 ng/mL (ref <0.03)

## 2016-10-09 LAB — GLUCOSE, CAPILLARY: GLUCOSE-CAPILLARY: 171 mg/dL — AB (ref 65–99)

## 2016-10-09 LAB — PROTIME-INR
INR: 1.06
PROTHROMBIN TIME: 13.7 s (ref 11.4–15.2)

## 2016-10-09 MED ORDER — ONDANSETRON HCL 4 MG/2ML IJ SOLN: 4.0000 mg | Freq: Four times a day (QID) | INTRAMUSCULAR | Status: DC | PRN

## 2016-10-09 MED ORDER — HEPARIN (PORCINE) IN NACL 100-0.45 UNIT/ML-% IJ SOLN
1100.0000 [IU]/h | INTRAMUSCULAR | Status: DC
  Administered 2016-10-09: 1100 [IU]/h via INTRAVENOUS

## 2016-10-09 MED ORDER — GLIMEPIRIDE 1 MG PO TABS
1.0000 mg | ORAL_TABLET | Freq: Every day | ORAL | Status: DC
  Administered 2016-10-10 – 2016-10-12 (×2): 1 mg via ORAL
  Filled 2016-10-09 (×3): qty 1

## 2016-10-09 MED ORDER — ATORVASTATIN CALCIUM 80 MG PO TABS
80.0000 mg | ORAL_TABLET | Freq: Every day | ORAL | Status: DC
  Administered 2016-10-09 – 2016-10-11 (×3): 80 mg via ORAL
  Filled 2016-10-09 (×3): qty 1

## 2016-10-09 MED ORDER — AMLODIPINE BESYLATE 10 MG PO TABS
10.0000 mg | ORAL_TABLET | Freq: Every day | ORAL | Status: DC
  Administered 2016-10-10 – 2016-10-12 (×3): 10 mg via ORAL
  Filled 2016-10-09 (×4): qty 1

## 2016-10-09 MED ORDER — ISOSORBIDE MONONITRATE ER 60 MG PO TB24
60.0000 mg | ORAL_TABLET | Freq: Every day | ORAL | Status: DC
  Administered 2016-10-09 – 2016-10-12 (×4): 60 mg via ORAL
  Filled 2016-10-09 (×4): qty 1

## 2016-10-09 MED ORDER — ASPIRIN 81 MG PO TABS
81.0000 mg | ORAL_TABLET | Freq: Every day | ORAL | Status: DC
Start: 2016-10-09 — End: 2016-10-09

## 2016-10-09 MED ORDER — FUROSEMIDE 20 MG PO TABS
20.0000 mg | ORAL_TABLET | ORAL | Status: DC
  Administered 2016-10-10 – 2016-10-12 (×2): 20 mg via ORAL
  Filled 2016-10-09 (×2): qty 1

## 2016-10-09 MED ORDER — NITROGLYCERIN 0.4 MG SL SUBL: 0.4000 mg | SUBLINGUAL_TABLET | SUBLINGUAL | Status: DC | PRN

## 2016-10-09 MED ORDER — CARVEDILOL 25 MG PO TABS
25.0000 mg | ORAL_TABLET | Freq: Two times a day (BID) | ORAL | Status: DC
  Administered 2016-10-09 – 2016-10-12 (×5): 25 mg via ORAL
  Filled 2016-10-09 (×5): qty 1

## 2016-10-09 MED ORDER — MAGNESIUM SULFATE 2 GM/50ML IV SOLN
2.0000 g | Freq: Once | INTRAVENOUS | Status: AC
  Administered 2016-10-09: 2 g via INTRAVENOUS
  Filled 2016-10-09: qty 50

## 2016-10-09 MED ORDER — POTASSIUM CHLORIDE CRYS ER 20 MEQ PO TBCR
20.0000 meq | EXTENDED_RELEASE_TABLET | Freq: Every day | ORAL | Status: DC
  Administered 2016-10-09 – 2016-10-12 (×4): 20 meq via ORAL
  Filled 2016-10-09 (×4): qty 1

## 2016-10-09 MED ORDER — POTASSIUM CHLORIDE CRYS ER 20 MEQ PO TBCR
40.0000 meq | EXTENDED_RELEASE_TABLET | Freq: Once | ORAL | Status: AC
  Administered 2016-10-09: 40 meq via ORAL
  Filled 2016-10-09: qty 2

## 2016-10-09 MED ORDER — TAMSULOSIN HCL 0.4 MG PO CAPS
0.4000 mg | ORAL_CAPSULE | Freq: Two times a day (BID) | ORAL | Status: DC
  Administered 2016-10-09 – 2016-10-12 (×6): 0.4 mg via ORAL
  Filled 2016-10-09 (×6): qty 1

## 2016-10-09 MED ORDER — CLOPIDOGREL BISULFATE 75 MG PO TABS
75.0000 mg | ORAL_TABLET | Freq: Every day | ORAL | Status: DC
  Administered 2016-10-09 – 2016-10-11 (×3): 75 mg via ORAL
  Filled 2016-10-09 (×3): qty 1

## 2016-10-09 MED ORDER — ACETAMINOPHEN 325 MG PO TABS: 650.0000 mg | ORAL_TABLET | ORAL | Status: DC | PRN

## 2016-10-09 MED ORDER — ASPIRIN EC 81 MG PO TBEC
81.0000 mg | DELAYED_RELEASE_TABLET | Freq: Every day | ORAL | Status: DC
  Administered 2016-10-10 – 2016-10-11 (×2): 81 mg via ORAL
  Filled 2016-10-09 (×2): qty 1

## 2016-10-09 MED ORDER — LISINOPRIL 20 MG PO TABS
40.0000 mg | ORAL_TABLET | Freq: Every day | ORAL | Status: DC
  Administered 2016-10-09 – 2016-10-12 (×4): 40 mg via ORAL
  Filled 2016-10-09 (×3): qty 1
  Filled 2016-10-09: qty 2

## 2016-10-09 NOTE — Progress Notes (Signed)
ANTICOAGULATION CONSULT NOTE - Initial Consult  Pharmacy Consult for heparin Indication: chest pain/ACS  Allergies  Allergen Reactions  . Penicillins Other (See Comments)    Passed out as a child  . Contrast Media [Iodinated Diagnostic Agents] Other (See Comments)    Had "welts" on arm, and kidney issues after given dye in the past    Patient Measurements: Height: 5\' 10"  (177.8 cm) Weight: 214 lb (97.1 kg) IBW/kg (Calculated) : 73 Heparin Dosing Weight: 93  Vital Signs: Temp: 98.6 F (37 C) (09/15 1700) Temp Source: Oral (09/15 1700) BP: 158/83 (09/15 1700) Pulse Rate: 84 (09/15 1700)  Labs: No results for input(s): HGB, HCT, PLT, APTT, LABPROT, INR, HEPARINUNFRC, HEPRLOWMOCWT, CREATININE, CKTOTAL, CKMB, TROPONINI in the last 72 hours.  CrCl cannot be calculated (Patient's most recent lab result is older than the maximum 21 days allowed.).   Medical History: Past Medical History:  Diagnosis Date  . Arthritis   . Basal cell carcinoma   . Coronary artery disease    DES to LAD and circumflex April 2014, PTCA ostial circumflex 03/2016 due to ISR  . Dyslipidemia   . Essential hypertension   . GERD (gastroesophageal reflux disease)   . Hematuria   . History of blood transfusion 09/2012  . History of kidney stones   . NSTEMI (non-ST elevated myocardial infarction) ([place]) 08/2012  . Type II diabetes mellitus (HCC)     Medications:  Scheduled:  . [START ON 10/10/2016] aspirin EC  81 mg Oral Daily   Infusions: N/A  Assessment: 52 yom admitted with consult for pharmacy to dose heparin for ACS. Patient was transferred from [facility] on heparin infusion. Received 5000 unit bolus at 0550 followed by infusion running at 12.4 mL/hr starting at 1340 from OSH chart. Unclear if any levels were obtained prior to transfer. Infusion was stopped at 2000; however, will be restarted due to troponin increase.  No anticoagulations prior to admission. H/H and platelets within  normal limits. No signs/symptoms of bleeding.   Goal of Therapy:  Heparin level 0.3-0.7 units/ml Monitor platelets by anticoagulation protocol: Yes   Plan:  Restart heparin infusion at 1100 units/hr Check anti-Xa level in 6 hours and daily while on heparin Continue to monitor H&H and platelets  Doylene Canard, PharmD Clinical Pharmacist  Phone: [phone] 10/09/2016,7:49 PM

## 2016-10-10 ENCOUNTER — Other Ambulatory Visit: Payer: Self-pay | Admitting: Cardiology

## 2016-10-10 ENCOUNTER — Inpatient Hospital Stay (HOSPITAL_COMMUNITY): Payer: Medicare Other

## 2016-10-10 ENCOUNTER — Encounter (HOSPITAL_COMMUNITY): Payer: Self-pay

## 2016-10-10 DIAGNOSIS — E785 Hyperlipidemia, unspecified: Secondary | ICD-10-CM

## 2016-10-10 DIAGNOSIS — I25118 Atherosclerotic heart disease of native coronary artery with other forms of angina pectoris: Secondary | ICD-10-CM

## 2016-10-10 DIAGNOSIS — I214 Non-ST elevation (NSTEMI) myocardial infarction: Secondary | ICD-10-CM

## 2016-10-10 DIAGNOSIS — I200 Unstable angina: Secondary | ICD-10-CM

## 2016-10-10 DIAGNOSIS — I10 Essential (primary) hypertension: Secondary | ICD-10-CM

## 2016-10-10 DIAGNOSIS — R748 Abnormal levels of other serum enzymes: Secondary | ICD-10-CM

## 2016-10-10 DIAGNOSIS — I350 Nonrheumatic aortic (valve) stenosis: Secondary | ICD-10-CM

## 2016-10-10 LAB — LIPID PANEL
CHOL/HDL RATIO: 2.8 ratio
CHOLESTEROL: 99 mg/dL (ref 0–200)
HDL: 35 mg/dL — ABNORMAL LOW (ref >40)
LDL Cholesterol: 48 mg/dL (ref 0–99)
TRIGLYCERIDES: 80 mg/dL (ref <150)
VLDL: 16 mg/dL (ref 0–40)

## 2016-10-10 LAB — CBC
HCT: 39.5 % (ref 39.0–52.0)
Hemoglobin: 13.1 g/dL (ref 13.0–17.0)
MCH: 30.3 pg (ref 26.0–34.0)
MCHC: 33.2 g/dL (ref 30.0–36.0)
MCV: 91.2 fL (ref 78.0–100.0)
PLATELETS: 165 10*3/uL (ref 150–400)
RBC: 4.33 MIL/uL (ref 4.22–5.81)
RDW: 12.2 % (ref 11.5–15.5)
WBC: 9.1 10*3/uL (ref 4.0–10.5)

## 2016-10-10 LAB — BASIC METABOLIC PANEL
Anion gap: 10 (ref 5–15)
BUN: 9 mg/dL (ref 6–20)
CALCIUM: 8.3 mg/dL — AB (ref 8.9–10.3)
CHLORIDE: 101 mmol/L (ref 101–111)
CO2: 27 mmol/L (ref 22–32)
CREATININE: 0.73 mg/dL (ref 0.61–1.24)
GFR calc Af Amer: >60 mL/min (ref >60)
GFR calc non Af Amer: >60 mL/min (ref >60)
GLUCOSE: 191 mg/dL — AB (ref 65–99)
Potassium: 3.8 mmol/L (ref 3.5–5.1)
Sodium: 138 mmol/L (ref 135–145)

## 2016-10-10 LAB — MAGNESIUM
MAGNESIUM: 1.7 mg/dL (ref 1.7–2.4)
Magnesium: 2.1 mg/dL (ref 1.7–2.4)

## 2016-10-10 LAB — GLUCOSE, CAPILLARY: Glucose-Capillary: 164 mg/dL — ABNORMAL HIGH (ref 65–99)

## 2016-10-10 LAB — HEPARIN LEVEL (UNFRACTIONATED)
HEPARIN UNFRACTIONATED: 0.12 [IU]/mL — AB (ref 0.30–0.70)
Heparin Unfractionated: 0.5 IU/mL (ref 0.30–0.70)

## 2016-10-10 LAB — TROPONIN I
TROPONIN I: 0.66 ng/mL — AB (ref <0.03)
Troponin I: 0.52 ng/mL (ref <0.03)
Troponin I: 0.65 ng/mL (ref <0.03)

## 2016-10-10 LAB — ECHOCARDIOGRAM COMPLETE
HEIGHTINCHES: 70 in
WEIGHTICAEL: 3334.4 [oz_av]

## 2016-10-10 MED ORDER — INFLUENZA VAC SPLIT HIGH-DOSE 0.5 ML IM SUSY: 0.5000 mL | PREFILLED_SYRINGE | INTRAMUSCULAR | Status: DC | PRN

## 2016-10-10 MED ORDER — MAGNESIUM SULFATE 2 GM/50ML IV SOLN
2.0000 g | Freq: Once | INTRAVENOUS | Status: AC
  Administered 2016-10-10: 2 g via INTRAVENOUS
  Filled 2016-10-10: qty 50

## 2016-10-10 MED ORDER — HEPARIN BOLUS VIA INFUSION
2500.0000 [IU] | Freq: Once | INTRAVENOUS | Status: AC
  Administered 2016-10-10: 2500 [IU] via INTRAVENOUS
  Filled 2016-10-10: qty 2500

## 2016-10-10 MED ORDER — HEPARIN (PORCINE) IN NACL 100-0.45 UNIT/ML-% IJ SOLN
1350.0000 [IU]/h | INTRAMUSCULAR | Status: DC
  Administered 2016-10-10: 1350 [IU]/h via INTRAVENOUS
  Filled 2016-10-10 (×2): qty 250

## 2016-10-10 NOTE — Progress Notes (Signed)
Progress Note  Patient Name: Sean Villarreal Date of Encounter: 10/10/2016  Primary Cardiologist: Dr. Domenic Polite  Subjective   Feeling better, no chest pain or SOB, though his SOB is with exertion 99% of time.  On IV heaprin  Inpatient Medications    Scheduled Meds: . amLODipine  10 mg Oral Daily  . aspirin EC  81 mg Oral Daily  . atorvastatin  80 mg Oral QHS  . carvedilol  25 mg Oral BID WC  . clopidogrel  75 mg Oral Daily  . furosemide  20 mg Oral QODAY  . glimepiride  1 mg Oral Q breakfast  . isosorbide mononitrate  60 mg Oral Daily  . lisinopril  40 mg Oral Daily  . potassium chloride SA  20 mEq Oral Daily  . tamsulosin  0.4 mg Oral BID   Continuous Infusions: . heparin 1,350 Units/hr (10/10/16 0337)   PRN Meds: acetaminophen, Influenza vac split quadrivalent PF, nitroGLYCERIN, ondansetron (ZOFRAN) IV   Vital Signs    Vitals:   10/10/16 0500 10/10/16 0505 10/10/16 0510 10/10/16 0610  BP: 112/63 112/63 105/61 107/64  Pulse:  67 69 70  Resp: 20     Temp: 98.5 F (36.9 C)     TempSrc: Oral     SpO2:  96% 95% 95%  Weight: 208 lb 6.4 oz (94.5 kg)     Height:        Intake/Output Summary (Last 24 hours) at 10/10/16 0924 Last data filed at 10/10/16 0600  Gross per 24 hour  Intake              356 ml  Output              700 ml  Net             -344 ml   Filed Weights   10/09/16 1700 10/10/16 0500  Weight: 214 lb (97.1 kg) 208 lb 6.4 oz (94.5 kg)    Telemetry    SR with 2 episodes of 3 sec SVT  - Personally Reviewed  ECG    10/10/16 0644 SR normal EKG - Personally Reviewed  Physical Exam   GEN: No acute distress.   Neck: No JVD Cardiac: RRR, 5-3/2 systolic murmur, no rubs, or gallops.  Respiratory: Clear to auscultation bilaterally. GI: Soft, nontender, non-distended  MS: No edema; No deformity. ( had had lower ext edema but resolved this AM) Neuro:  Nonfocal  Psych: Normal affect   Labs    Chemistry Recent Labs Lab 10/09/16 1952  10/10/16 0739  NA 140 138  K 3.1* 3.8  CL 100* 101  CO2 31 27  GLUCOSE 167* 191*  BUN 10 9  CREATININE 0.84 0.73  CALCIUM 8.6* 8.3*  PROT 6.7  --   ALBUMIN 3.3*  --   AST 20  --   ALT 19  --   ALKPHOS 41  --   BILITOT 0.9  --   GFRNONAA >60 >60  GFRAA >60 >60  ANIONGAP 9 10     Hematology Recent Labs Lab 10/09/16 1952 10/10/16 0215  WBC 11.2* 9.1  RBC 4.59 4.33  HGB 13.8 13.1  HCT 41.7 39.5  MCV 90.8 91.2  MCH 30.1 30.3  MCHC 33.1 33.2  RDW 12.4 12.2  PLT 185 165    Cardiac Enzymes Recent Labs Lab 10/09/16 1952 10/10/16 0215 10/10/16 0739  TROPONINI 0.46* 0.52* 0.66*   No results for input(s): TROPIPOC in the last 168 hours.   BNP  Recent Labs Lab 10/09/16 1952  BNP 126.4*     DDimer No results for input(s): DDIMER in the last 168 hours.   Radiology    No results found.  Cardiac Studies   Cardiac cath 04/15/16 Procedures   Coronary Balloon Angioplasty  Left Heart Cath and Coronary Angiography  Conclusion     Prox RCA to Mid RCA lesion, 15 %stenosed.  Prox LAD to Mid LAD lesion, 0 %stenosed.  Ost LAD to Prox LAD lesion, 10 %stenosed.  Mid LAD to Dist LAD lesion, 60 %stenosed.  Ost Ramus lesion, 100 %stenosed.  Prox Cx to Mid Cx lesion, 70 %stenosed.  The left ventricular systolic function is normal.  LV end diastolic pressure is normal.  The left ventricular ejection fraction is 55-65% by visual estimate.  Ost Cx to Prox Cx lesion, 95 %stenosed.  Post intervention, there is a 15% residual stenosis.   1. 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 Moderate disease in the mid LCx and distal LAD. 2. Normal LV function 3. Normal LVEDP 4. Successful POBA of the ostium of the LCx   Plan: continue DAPT. Medical therapy for residual disease. For future cardiac cath/interventions I would recommend a femoral approach.       Patient Profile     77 y.o. male with a hx of NSTEMI in  2014 and prior to the POBA in March of this year- ostium of LCX and residual non obstructive disease.  Now admitted with similar chest pain.  Assessment & Plan    Unstable angina/ACS troponin pk 0.66 --imdur increased to 60 mg, amlodipine continues at 10, coreg 25 BID.  ? Stress test or cath if no improvement with medications? -IV heparin drip -ASA and plavix -Echo has been ordered.  CAD with hx of POBA March of this year - residual disease of non obstruction in other vessels, EF 60-65%   Hypokalemia replaced and now K+ 3.8   HLD with LDL 48 continue statin lipitor 80  DM-2 on Amaryl and Glucophage (hold), SSI  For questions or updates, please contact [facility] Please consult [email or web address] for contact info under Cardiology/STEMI.      Signed, Cecilie Kicks, NP  10/10/2016, 9:24 AM    The patient was seen and examined, and I agree with the history, physical exam, assessment and plan as documented above, with modifications as noted below. I have also personally reviewed all relevant documentation, old records, labs, and both radiographic and cardiovascular studies. I have also independently interpreted old and new ECG's.  Pt says symptoms are improved while at rest. Currently denies chest pain and shortness of breath. He is agreeable to either stress test or cath, whichever is deemed necessary. Remains on IV heparin. Antianginals titrated by admitting fellow. BP has normalized. ECG is normal.  Await echocardiogram results. Will check another troponin. Consider stress test vs cath tomorrow depending upon results.   Kate Sable, MD, [facility]  10/10/2016 9:58 AM

## 2016-10-10 NOTE — Plan of Care (Signed)
Problem: Safety: Goal: Ability to remain free from injury will improve Outcome: Completed/Met Date Met: 10/10/16 Patient uses call light appropriately and waits for assistance as instructed, all personal items within reach.

## 2016-10-10 NOTE — Progress Notes (Signed)
ANTICOAGULATION CONSULT NOTE - Follow Up Consult  Pharmacy Consult for Heparin Indication: chest pain/ACS  Patient Measurements: Height: 5\' 10"  (177.8 cm) Weight: 208 lb 6.4 oz (94.5 kg) IBW/kg (Calculated) : 73 Heparin Dosing Weight: 93 kg  Vital Signs: Temp: 98.5 F (36.9 C) (09/16 0500) Temp Source: Oral (09/16 0500) BP: 107/64 (09/16 0610) Pulse Rate: 70 (09/16 0610)  Labs:  Recent Labs  10/09/16 1952 10/10/16 0215 10/10/16 0739 10/10/16 1137  HGB 13.8 13.1  --   --   HCT 41.7 39.5  --   --   PLT 185 165  --   --   LABPROT 13.7  --   --   --   INR 1.06  --   --   --   HEPARINUNFRC  --  0.12*  --  0.50  CREATININE 0.84  --  0.73  --   TROPONINI 0.46* 0.52* 0.66*  --     Estimated Creatinine Clearance: 92.1 mL/min (by C-G formula based on SCr of 0.73 mg/dL).  Assessment: 76 yoM on heparin for chest pain with elevated troponin. Heparin level is therapeutic at 0.50.   CBC stable and wnl. No bleeding noted. No changes to infusion will be made   Goal of Therapy:  Heparin level 0.3-0.7 units/ml Monitor platelets by anticoagulation protocol: Yes   Plan:  1) Continue heparin at 1,350 units/hour 2) Daily heparin level and CBC 3) Monitor for signs/symptoms of bleeding   Diana L. Kyung Rudd, PharmD, [place] PGY1 Pharmacy Resident Pager: [phone]

## 2016-10-10 NOTE — Progress Notes (Signed)
MD up and was able to assess patient and placed orders in the computer, will D/C Heparin for now as per MD and get labs, VSS and patient has no C/O pain at this time, will continue to monitor.

## 2016-10-10 NOTE — Progress Notes (Signed)
Text paged MD re: am labs, Magnesium level now 1.7 from 1.1 with a 2 gm supplement and 2nd Troponin is 0.56 with B/P a little soft in upper 90's and MAP 78, pt sleeping at this time with no S/S distress. MD returned the page immediately and gave a verbal order to give another 2 gm IV Magnesium, will continue to monitor.

## 2016-10-10 NOTE — Progress Notes (Signed)
Report received via [place] RN in patient's room using [employer], reviewed chart, VS and patient's general condition, assumed care of patient. Patient came from [facility] and Cardiology fellow called for orders. Patient is on a Heparin drip at 1240 units/hr which is 12.4cc/hr, will ask MD if we are to continue.

## 2016-10-10 NOTE — Progress Notes (Signed)
MD called and decided to put the patient back on his Heparin drip since his Troponin was slightly elevated, will call pharmacy to determine the correct dose, no other changes noted, will place a second line to give IV Magnesium as per order. Pharmacy given information pertaining to the dose of Heparin and the times from [facility] also gave pager number for cardiology fellow, will restart at the amount ordered.

## 2016-10-10 NOTE — Plan of Care (Signed)
Problem: Pain Managment: Goal: General experience of comfort will improve Outcome: Progressing Patient denies pain at this time, will continue to monitor.

## 2016-10-10 NOTE — Progress Notes (Signed)
Troponin came back at 0.46 which is the first positive one since admission, text paged Cardiology fellow, also let him know that his potassium level was 3.1 and his magnesium level was 1.1, awaiting further orders, VSS and patient remains chest pain free, will continue to monitor.

## 2016-10-10 NOTE — Plan of Care (Signed)
Problem: Safety: Goal: Ability to remain free from injury will improve Outcome: Progressing Instructed patient on the use of the call light and how to get in touch with the RN/NT via phone using the numbers on the white board. He was instructed to call for assistance before getting up and patient verbalized understanding, will continue to monitor.

## 2016-10-10 NOTE — Progress Notes (Signed)
ANTICOAGULATION CONSULT NOTE - Follow Up Consult  Pharmacy Consult for Heparin Indication: chest pain/ACS  Allergies  Allergen Reactions  . Penicillins Other (See Comments)    Passed out as a child  . Contrast Media [Iodinated Diagnostic Agents] Other (See Comments)    Had "welts" on arm, and kidney issues after given dye in the past    Patient Measurements: Height: 5\' 10"  (177.8 cm) Weight: 214 lb (97.1 kg) IBW/kg (Calculated) : 73 Heparin Dosing Weight: 93 kg  Vital Signs: Temp: 98.6 F (37 C) (09/15 1700) Temp Source: Oral (09/15 1700) BP: 141/80 (09/15 2223) Pulse Rate: 84 (09/15 1700)  Labs:  Recent Labs  10/09/16 1952 10/10/16 0215  HGB 13.8 13.1  HCT 41.7 39.5  PLT 185 165  LABPROT 13.7  --   INR 1.06  --   HEPARINUNFRC  --  0.12*  CREATININE 0.84  --   TROPONINI 0.46*  --     Estimated Creatinine Clearance: 88.8 mL/min (by C-G formula based on SCr of 0.84 mg/dL).   Medications:  Heparin @ 1100 units/hr  Assessment: 76 yom on heparin for chest pain with elevated troponin. Initial heparin level is below goal at 0.12. No issues with infusion.  Goal of Therapy:  Heparin level 0.3-0.7 units/ml Monitor platelets by anticoagulation protocol: Yes   Plan:  1) Re-bolus heparin 2500 units x 1 2) Increase heparin to 1350 units/hr 3) Check 8 hour heparin level  Deboraha Sprang 10/10/2016,3:34 AM

## 2016-10-10 NOTE — H&P (Signed)
CARDIOLOGY H&P  Primary cardiologist is Dr. Domenic Polite  HPI: Patient is a 76 y/o caucasian M who had L sided chest pain, sharp, pressure like for the past 4 days, associated with SOB. He claims that 2 days ago, he had significant L sided chest pressure and shortness of breath that woke him from his sleep at 3:30 am. Due to this, he went to an OSH and received sublingual NTG which improved his pain. He claims this was the same type of pain he experienced at the time of his NSTEMI in 2014 and prior to the POBA in march of this year. He claims that his BP is stable at home in the 366'Y -403'K systolic. He states that after the POBA in march of 2018, his symptoms improved, but recently recurred about 4 days ago. Symptoms are not necessarily exertional, but can occur when he performs activities. He denies any lower extremity edema, orthopnea, dizziness or syncope.   He remains compliant with all of his medications and denies smoking or drinking. At the time of arrival to [facility], he denies any chest pain.  Review of Systems:     Cardiac Review of Systems: {Y] = yes [ ]  = no  Chest Pain [  Y  ]  Resting SOB [ Y  ] Exertional SOB  [  ]  Orthopnea [  ]   Pedal Edema [   ]    Palpitations [  ] Syncope  [  ]   Presyncope [  ]  General Review of Systems: [Y] = yes [  ]=no Constitional: recent weight change [  ]; anorexia [  ]; fatigue [  ]; nausea [  ]; night sweats [  ]; fever [  ]; or chills [  ];                                                                     Dental: poor dentition[  ];   Eye : blurred vision [  ]; diplopia [   ]; vision changes [  ];  Amaurosis fugax[] ; Resp: cough [  ];  wheezing[  ];  hemoptysis[  ]; shortness of breath[ Y ]; paroxysmal nocturnal dyspnea[  ]; dyspnea on exertion[  ]; or orthopnea[  ];  GI:  gallstones[  ], vomiting[  ];  dysphagia[  ]; melena[  ];  hematochezia [  ]; heartburn[  ];   GU: kidney stones [  ]; hematuria[  ];   dysuria [  ];  nocturia[  ];      Skin: rash [  ], swelling[  ];, hair loss[  ];  peripheral edema[  ];  or itching[  ]; Musculosketetal: myalgias[  ];  joint swelling[  ];  joint erythema[  ];  joint pain[  ];  back pain[  ];  Heme/Lymph: bruising[  ];  bleeding[  ];  anemia[  ];  Neuro: TIA[  ];  headaches[  ];  stroke[  ];  vertigo[  ];  seizures[  ];   paresthesias[  ];  difficulty walking[  ];  Psych:depression[  ]; anxiety[  ];  Endocrine: diabetes[Y  ];  thyroid dysfunction[  ];  Other:  Past Medical History:  Diagnosis Date  . Arthritis   .  Basal cell carcinoma   . Coronary artery disease    DES to LAD and circumflex April 2014, PTCA ostial circumflex 03/2016 due to ISR  . Dyslipidemia   . Essential hypertension   . GERD (gastroesophageal reflux disease)   . Hematuria   . History of blood transfusion 09/2012  . History of kidney stones   . NSTEMI (non-ST elevated myocardial infarction) ([place]) 08/2012  . Type II diabetes mellitus (HCC)     @[facility] @   Allergies  Allergen Reactions  . Penicillins Other (See Comments)    Passed out as a child  . Contrast Media [Iodinated Diagnostic Agents] Other (See Comments)    Had "welts" on arm, and kidney issues after given dye in the past    Social History   Social History  . Marital status: Married    Spouse name: N/A  . Number of children: N/A  . Years of education: N/A   Occupational History  . Not on file.   Social History Main Topics  . Smoking status: Former Smoker    Packs/day: 0.75    Years: 30.00    Types: Cigarettes    Start date: 01/25/1982    Quit date: 11/05/1988  . Smokeless tobacco: Never Used  . Alcohol use 0.0 oz/week     Comment: 04/15/2016 "I might drink 2 beers/month"  . Drug use: No  . Sexual activity: Not Currently   Other Topics Concern  . Not on file   Social History Narrative  . No narrative on file   Family History  Problem Relation Age of Onset  . Heart disease Mother   . Heart failure Mother   . Heart disease  Father   . Heart attack Father   . Colon cancer Neg Hx    PHYSICAL EXAM: Vitals:   10/09/16 1936 10/09/16 2223  BP:  (!) 141/80  Pulse:    Resp:    Temp:    SpO2: 96%    General:  Well appearing. No respiratory difficulty HEENT: normal Neck: supple. no JVD. Carotids no bruits.  Cor: PMI nondisplaced. Regular rate & rhythm. Systolic ejection murmurs at the R 2nd intercostal space. Lungs: clear Abdomen: soft, nontender, nondistended. No hepatosplenomegaly. No bruits or masses. Good bowel sounds. Extremities: no cyanosis, clubbing, rash, edema Neuro: alert & oriented x 3, cranial nerves grossly intact. moves all 4 extremities w/o difficulty. Affect pleasant.  ECG:  Results for orders placed or performed during the hospital encounter of 10/09/16 (from the past 24 hour(s))  Comprehensive metabolic panel     Status: Abnormal   Collection Time: 10/09/16  7:52 PM  Result Value Ref Range   Sodium 140 135 - 145 mmol/L   Potassium 3.1 (L) 3.5 - 5.1 mmol/L   Chloride 100 (L) 101 - 111 mmol/L   CO2 31 22 - 32 mmol/L   Glucose, Bld 167 (H) 65 - 99 mg/dL   BUN 10 6 - 20 mg/dL   Creatinine, Ser 0.84 0.61 - 1.24 mg/dL   Calcium 8.6 (L) 8.9 - 10.3 mg/dL   Total Protein 6.7 6.5 - 8.1 g/dL   Albumin 3.3 (L) 3.5 - 5.0 g/dL   AST 20 15 - 41 U/L   ALT 19 17 - 63 U/L   Alkaline Phosphatase 41 38 - 126 U/L   Total Bilirubin 0.9 0.3 - 1.2 mg/dL   GFR calc non Af Amer >60 >60 mL/min   GFR calc Af Amer >60 >60 mL/min   Anion gap 9 5 -  15  Magnesium     Status: Abnormal   Collection Time: 10/09/16  7:52 PM  Result Value Ref Range   Magnesium 1.1 (L) 1.7 - 2.4 mg/dL  TSH     Status: None   Collection Time: 10/09/16  7:52 PM  Result Value Ref Range   TSH 2.786 0.350 - 4.500 uIU/mL  T4, free     Status: Abnormal   Collection Time: 10/09/16  7:52 PM  Result Value Ref Range   Free T4 1.24 (H) 0.61 - 1.12 ng/dL  Troponin I     Status: Abnormal   Collection Time: 10/09/16  7:52 PM  Result  Value Ref Range   Troponin I 0.46 (HH) <0.03 ng/mL  Hemoglobin A1c     Status: Abnormal   Collection Time: 10/09/16  7:52 PM  Result Value Ref Range   Hgb A1c MFr Bld 7.2 (H) 4.8 - 5.6 %   Mean Plasma Glucose 159.94 mg/dL  Brain natriuretic peptide     Status: Abnormal   Collection Time: 10/09/16  7:52 PM  Result Value Ref Range   B Natriuretic Peptide 126.4 (H) 0.0 - 100.0 pg/mL  CBC WITH DIFFERENTIAL     Status: Abnormal   Collection Time: 10/09/16  7:52 PM  Result Value Ref Range   WBC 11.2 (H) 4.0 - 10.5 K/uL   RBC 4.59 4.22 - 5.81 MIL/uL   Hemoglobin 13.8 13.0 - 17.0 g/dL   HCT 41.7 39.0 - 52.0 %   MCV 90.8 78.0 - 100.0 fL   MCH 30.1 26.0 - 34.0 pg   MCHC 33.1 30.0 - 36.0 g/dL   RDW 12.4 11.5 - 15.5 %   Platelets 185 150 - 400 K/uL   Neutrophils Relative % 65 %   Neutro Abs 7.4 1.7 - 7.7 K/uL   Lymphocytes Relative 22 %   Lymphs Abs 2.5 0.7 - 4.0 K/uL   Monocytes Relative 11 %   Monocytes Absolute 1.2 (H) 0.1 - 1.0 K/uL   Eosinophils Relative 2 %   Eosinophils Absolute 0.2 0.0 - 0.7 K/uL   Basophils Relative 0 %   Basophils Absolute 0.0 0.0 - 0.1 K/uL  Protime-INR     Status: None   Collection Time: 10/09/16  7:52 PM  Result Value Ref Range   Prothrombin Time 13.7 11.4 - 15.2 seconds   INR 1.06   Glucose, capillary     Status: Abnormal   Collection Time: 10/09/16  9:35 PM  Result Value Ref Range   Glucose-Capillary 171 (H) 65 - 99 mg/dL  CBC     Status: None   Collection Time: 10/10/16  2:15 AM  Result Value Ref Range   WBC 9.1 4.0 - 10.5 K/uL   RBC 4.33 4.22 - 5.81 MIL/uL   Hemoglobin 13.1 13.0 - 17.0 g/dL   HCT 39.5 39.0 - 52.0 %   MCV 91.2 78.0 - 100.0 fL   MCH 30.3 26.0 - 34.0 pg   MCHC 33.2 30.0 - 36.0 g/dL   RDW 12.2 11.5 - 15.5 %   Platelets 165 150 - 400 K/uL   No results found.  ASSESSMENT:  NSTEMI Hx of CAD s/p; DES to the LAD and ostial circ in 2014, with recent heart cath in 03/2016 showing ostial Cx in stent restenosis requiring POBA, with  residual disease described below.  HTN, HLP, Type 2 DM Chronic diastolic HF with preserved EF  PLAN/DISCUSSION:  Patient's troponin became elevated here at 0.4, but the first two were negative.  Will continue to trend trops. EKG from OSH shows no significant, new, ST-T changes. Trops at OSH were negative. BP at time of presentation was in the 160's. Echo in the AM. EKG here pending. Troponin's may be elevated due to high BP in the setting of residual CAD. Continue heparin gtt for now. Continue asa, plavix.  Will need to up titrate antianginal therapy. Will increase imdur to 60mg , Qday. Ok to continue the amlodipine 10mg , Qday, lipitor 80mg , Qday, lisinopril 40mg , Qday and coreg 25mg , BID. If continue to maximize anti anginal therapy and patient's symptoms don't improve, may consider stress test to evaluate the region of ischemia and consider intervention at that area or FFR of the affected area. If patient is found to have new regional wall motion changes he will require a heart cath. Will keep him NPO for now.   03/2016 cath findings: this was performed for unstable angina.   Prox RCA to Mid RCA lesion, 15 %stenosed.  Prox LAD to Mid LAD lesion, 0 %stenosed.  Ost LAD to Prox LAD lesion, 10 %stenosed.  Mid LAD to Dist LAD lesion, 60 %stenosed.  Ost Ramus lesion, 100 %stenosed.  Prox Cx to Mid Cx lesion, 70 %stenosed.  The left ventricular systolic function is normal.  LV end diastolic pressure is normal.  The left ventricular ejection fraction is 55-65% by visual estimate.  Ost Cx to Prox Cx lesion, 95 %stenosed. Post intervention, there is a 15% residual stenosis.  Patient's most recent echo from 05/2015 which show EF of 60-65% with no regional wall motion changes.   Electrolytes replaced. Will recheck in the AM. A1c, lipid panel and TSH pending.  Can consider outpatient sleep study.

## 2016-10-10 NOTE — Progress Notes (Signed)
*  PRELIMINARY RESULTS* Echocardiogram 2D Echocardiogram has been performed.  Leavy Cella 10/10/2016, 3:56 PM

## 2016-10-11 ENCOUNTER — Encounter (HOSPITAL_COMMUNITY)
Admission: AD | Disposition: A | Discharge status: Home / Self Care | Payer: Self-pay | Source: Other Acute Inpatient Hospital | Attending: Cardiovascular Disease

## 2016-10-11 DIAGNOSIS — I214 Non-ST elevation (NSTEMI) myocardial infarction: Secondary | ICD-10-CM

## 2016-10-11 DIAGNOSIS — I2510 Atherosclerotic heart disease of native coronary artery without angina pectoris: Secondary | ICD-10-CM

## 2016-10-11 HISTORY — PX: CORONARY STENT INTERVENTION: CATH118234

## 2016-10-11 HISTORY — PX: LEFT HEART CATH AND CORONARY ANGIOGRAPHY: CATH118249

## 2016-10-11 LAB — HEPARIN LEVEL (UNFRACTIONATED): HEPARIN UNFRACTIONATED: 0.39 [IU]/mL (ref 0.30–0.70)

## 2016-10-11 LAB — CBC
HCT: 38.9 % — ABNORMAL LOW (ref 39.0–52.0)
Hemoglobin: 13 g/dL (ref 13.0–17.0)
MCH: 30.2 pg (ref 26.0–34.0)
MCHC: 33.4 g/dL (ref 30.0–36.0)
MCV: 90.5 fL (ref 78.0–100.0)
PLATELETS: 195 10*3/uL (ref 150–400)
RBC: 4.3 MIL/uL (ref 4.22–5.81)
RDW: 12.4 % (ref 11.5–15.5)
WBC: 9.4 10*3/uL (ref 4.0–10.5)

## 2016-10-11 LAB — POCT ACTIVATED CLOTTING TIME: ACTIVATED CLOTTING TIME: 318 s

## 2016-10-11 SURGERY — LEFT HEART CATH AND CORONARY ANGIOGRAPHY
Anesthesia: LOCAL

## 2016-10-11 MED ORDER — LABETALOL HCL 5 MG/ML IV SOLN: 10.0000 mg | INTRAVENOUS | Status: AC | PRN

## 2016-10-11 MED ORDER — LIDOCAINE HCL (PF) 1 % IJ SOLN
INTRAMUSCULAR | Status: AC
  Filled 2016-10-11: qty 30

## 2016-10-11 MED ORDER — HEPARIN (PORCINE) IN NACL 2-0.9 UNIT/ML-% IJ SOLN
INTRAMUSCULAR | Status: AC | PRN
  Administered 2016-10-11: 1000 mL

## 2016-10-11 MED ORDER — FENTANYL CITRATE (PF) 100 MCG/2ML IJ SOLN
INTRAMUSCULAR | Status: DC | PRN
  Administered 2016-10-11: 25 ug via INTRAVENOUS

## 2016-10-11 MED ORDER — CLOPIDOGREL BISULFATE 75 MG PO TABS
75.0000 mg | ORAL_TABLET | Freq: Every day | ORAL | Status: DC
  Administered 2016-10-12: 75 mg via ORAL
  Filled 2016-10-11: qty 1

## 2016-10-11 MED ORDER — SODIUM CHLORIDE 0.9% FLUSH: 3.0000 mL | Freq: Two times a day (BID) | INTRAVENOUS | Status: DC

## 2016-10-11 MED ORDER — ASPIRIN 81 MG PO CHEW
81.0000 mg | CHEWABLE_TABLET | Freq: Every day | ORAL | Status: DC
  Administered 2016-10-12: 81 mg via ORAL
  Filled 2016-10-11: qty 1

## 2016-10-11 MED ORDER — NITROGLYCERIN 1 MG/10 ML FOR IR/CATH LAB
INTRA_ARTERIAL | Status: DC | PRN
  Administered 2016-10-11 (×2): 200 ug via INTRACORONARY

## 2016-10-11 MED ORDER — MIDAZOLAM HCL 2 MG/2ML IJ SOLN
INTRAMUSCULAR | Status: DC | PRN
  Administered 2016-10-11: 2 mg via INTRAVENOUS

## 2016-10-11 MED ORDER — SODIUM CHLORIDE 0.9 % WEIGHT BASED INFUSION: 3.0000 mL/kg/h | INTRAVENOUS | Status: DC

## 2016-10-11 MED ORDER — ONDANSETRON HCL 4 MG/2ML IJ SOLN: 4.0000 mg | Freq: Four times a day (QID) | INTRAMUSCULAR | Status: DC | PRN

## 2016-10-11 MED ORDER — CLOPIDOGREL BISULFATE 75 MG PO TABS: 75.0000 mg | ORAL_TABLET | Freq: Every day | ORAL | Status: DC

## 2016-10-11 MED ORDER — LIDOCAINE HCL (PF) 1 % IJ SOLN
INTRAMUSCULAR | Status: DC | PRN
  Administered 2016-10-11: 15 mL

## 2016-10-11 MED ORDER — FENTANYL CITRATE (PF) 100 MCG/2ML IJ SOLN
INTRAMUSCULAR | Status: AC
  Filled 2016-10-11: qty 2

## 2016-10-11 MED ORDER — CLOPIDOGREL BISULFATE 300 MG PO TABS
ORAL_TABLET | ORAL | Status: DC | PRN
  Administered 2016-10-11: 300 mg via ORAL

## 2016-10-11 MED ORDER — SODIUM CHLORIDE 0.9% FLUSH
3.0000 mL | Freq: Two times a day (BID) | INTRAVENOUS | Status: DC
  Administered 2016-10-11 – 2016-10-12 (×2): 3 mL via INTRAVENOUS

## 2016-10-11 MED ORDER — SODIUM CHLORIDE 0.9 % IV SOLN
INTRAVENOUS | Status: AC
  Administered 2016-10-11: 20:00:00 via INTRAVENOUS

## 2016-10-11 MED ORDER — ACETAMINOPHEN 325 MG PO TABS: 650.0000 mg | ORAL_TABLET | ORAL | Status: DC | PRN

## 2016-10-11 MED ORDER — HYDRALAZINE HCL 20 MG/ML IJ SOLN: 5.0000 mg | INTRAMUSCULAR | Status: AC | PRN

## 2016-10-11 MED ORDER — ASPIRIN 81 MG PO CHEW: 81.0000 mg | CHEWABLE_TABLET | ORAL | Status: DC

## 2016-10-11 MED ORDER — SODIUM CHLORIDE 0.9 % WEIGHT BASED INFUSION
1.0000 mL/kg/h | INTRAVENOUS | Status: DC
Start: 2016-10-12 — End: 2016-10-11

## 2016-10-11 MED ORDER — HEPARIN (PORCINE) IN NACL 2-0.9 UNIT/ML-% IJ SOLN
INTRAMUSCULAR | Status: AC
  Filled 2016-10-11: qty 1000

## 2016-10-11 MED ORDER — IOPAMIDOL (ISOVUE-370) INJECTION 76%
INTRAVENOUS | Status: AC
  Filled 2016-10-11: qty 100

## 2016-10-11 MED ORDER — BIVALIRUDIN TRIFLUOROACETATE 250 MG IV SOLR
INTRAVENOUS | Status: AC
  Filled 2016-10-11: qty 250

## 2016-10-11 MED ORDER — DIPHENHYDRAMINE HCL 50 MG/ML IJ SOLN
25.0000 mg | Freq: Once | INTRAMUSCULAR | Status: AC
  Administered 2016-10-11: 25 mg via INTRAVENOUS
  Filled 2016-10-11: qty 1

## 2016-10-11 MED ORDER — MIDAZOLAM HCL 2 MG/2ML IJ SOLN
INTRAMUSCULAR | Status: AC
  Filled 2016-10-11: qty 2

## 2016-10-11 MED ORDER — ASPIRIN 81 MG PO CHEW: 81.0000 mg | CHEWABLE_TABLET | ORAL | Status: AC

## 2016-10-11 MED ORDER — SODIUM CHLORIDE 0.9% FLUSH: 3.0000 mL | INTRAVENOUS | Status: DC | PRN

## 2016-10-11 MED ORDER — SODIUM CHLORIDE 0.9 % WEIGHT BASED INFUSION: 1.0000 mL/kg/h | INTRAVENOUS | Status: DC

## 2016-10-11 MED ORDER — CLOPIDOGREL BISULFATE 300 MG PO TABS
ORAL_TABLET | ORAL | Status: AC
  Filled 2016-10-11: qty 1

## 2016-10-11 MED ORDER — IOPAMIDOL (ISOVUE-370) INJECTION 76%
INTRAVENOUS | Status: DC | PRN
  Administered 2016-10-11: 140 mL via INTRA_ARTERIAL

## 2016-10-11 MED ORDER — SODIUM CHLORIDE 0.9 % IV SOLN: 250.0000 mL | INTRAVENOUS | Status: DC | PRN

## 2016-10-11 MED ORDER — SODIUM CHLORIDE 0.9 % WEIGHT BASED INFUSION
3.0000 mL/kg/h | INTRAVENOUS | Status: DC
  Administered 2016-10-11: 3 mL/kg/h via INTRAVENOUS

## 2016-10-11 MED ORDER — SODIUM CHLORIDE 0.9 % IV SOLN
INTRAVENOUS | Status: AC | PRN
  Administered 2016-10-11: 1.75 mg/kg/h via INTRAVENOUS

## 2016-10-11 MED ORDER — METHYLPREDNISOLONE SODIUM SUCC 125 MG IJ SOLR
125.0000 mg | Freq: Once | INTRAMUSCULAR | Status: AC
  Administered 2016-10-11: 125 mg via INTRAVENOUS
  Filled 2016-10-11: qty 2

## 2016-10-11 MED ORDER — NITROGLYCERIN 1 MG/10 ML FOR IR/CATH LAB
INTRA_ARTERIAL | Status: AC
  Filled 2016-10-11: qty 10

## 2016-10-11 MED ORDER — BIVALIRUDIN BOLUS VIA INFUSION - CUPID
INTRAVENOUS | Status: DC | PRN
  Administered 2016-10-11: 70.725 mg via INTRAVENOUS

## 2016-10-11 SURGICAL SUPPLY — 18 items
BALLN SAPPHIRE 2.5X12 (BALLOONS) ×4
BALLN SAPPHIRE ~~LOC~~ 3.5X8 (BALLOONS) ×2 IMPLANT
BALLOON SAPPHIRE 2.5X12 (BALLOONS) ×2 IMPLANT
CATH INFINITI MULTIPACK ST 5F (CATHETERS) ×2 IMPLANT
CATH LAUNCHER 6FR EBU3.5 (CATHETERS) ×2 IMPLANT
KIT ENCORE 26 ADVANTAGE (KITS) ×2 IMPLANT
KIT HEART LEFT (KITS) ×2 IMPLANT
PACK CARDIAC CATHETERIZATION (CUSTOM PROCEDURE TRAY) ×2 IMPLANT
SHEATH PINNACLE 5F 10CM (SHEATH) ×2 IMPLANT
SHEATH PINNACLE 6F 10CM (SHEATH) ×2 IMPLANT
STENT SYNERGY DES 2.5X12 (Permanent Stent) ×2 IMPLANT
STENT SYNERGY DES 3X16 (Permanent Stent) ×2 IMPLANT
TRANSDUCER W/STOPCOCK (MISCELLANEOUS) ×2 IMPLANT
TUBING CIL FLEX 10 FLL-RA (TUBING) ×2 IMPLANT
VALVE GUARDIAN II ~~LOC~~ HEMO (MISCELLANEOUS) ×2 IMPLANT
WIRE ASAHI PROWATER 180CM (WIRE) ×2 IMPLANT
WIRE EMERALD 3MM-J .035X150CM (WIRE) ×2 IMPLANT
WIRE HI TORQ BMW 190CM (WIRE) ×2 IMPLANT

## 2016-10-11 NOTE — Progress Notes (Signed)
Transferred- in from the cath lab by bed awake and alert. Right groin sheath intact level-0. Bed rest emphasized.  V/s taken and recorded. No complaints presented.

## 2016-10-11 NOTE — Progress Notes (Signed)
Right femoral sheath pulled at 2131 without any issue. Hemostasis achieved after 20 minutes of manual pressure. No sign of hematoma or bleeding. Site is level 0. No c/o pain. VSS. Patient educated about bed rest and staying flat. Will continue to monitor closely.

## 2016-10-11 NOTE — Interval H&P Note (Signed)
Cath Lab Visit (complete for each Cath Lab visit)  Clinical Evaluation Leading to the Procedure:   ACS: Yes.    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    History and Physical Interval Note:  10/11/2016 4:54 PM  [name]  has presented today for surgery, with the diagnosis of cp  The various methods of treatment have been discussed with the patient and family. After consideration of risks, benefits and other options for treatment, the patient has consented to  Procedure(s): LEFT HEART CATH AND CORONARY ANGIOGRAPHY (N/A) as a surgical intervention .  The patient's history has been reviewed, patient examined, no change in status, stable for surgery.  I have reviewed the patient's chart and labs.  Questions were answered to the patient's satisfaction.     Larae Grooms

## 2016-10-11 NOTE — H&P (View-Only) (Signed)
Progress Note  Patient Name: Sean Villarreal Date of Encounter: 10/11/2016  Primary Cardiologist: Domenic Polite   Subjective   No further chest pain  Inpatient Medications    Scheduled Meds: . amLODipine  10 mg Oral Daily  . aspirin EC  81 mg Oral Daily  . atorvastatin  80 mg Oral QHS  . carvedilol  25 mg Oral BID WC  . clopidogrel  75 mg Oral Daily  . furosemide  20 mg Oral QODAY  . glimepiride  1 mg Oral Q breakfast  . isosorbide mononitrate  60 mg Oral Daily  . lisinopril  40 mg Oral Daily  . potassium chloride SA  20 mEq Oral Daily  . tamsulosin  0.4 mg Oral BID   Continuous Infusions: . heparin 1,350 Units/hr (10/10/16 1500)   PRN Meds: acetaminophen, Influenza vac split quadrivalent PF, nitroGLYCERIN, ondansetron (ZOFRAN) IV   Vital Signs    Vitals:   10/10/16 0610 10/10/16 1607 10/10/16 2148 10/11/16 0400  BP: 107/64 123/67 135/73 136/76  Pulse: 70 75 73   Resp:   18 18  Temp:  98.5 F (36.9 C) 98.4 F (36.9 C) 98.7 F (37.1 C)  TempSrc:  Oral Oral Oral  SpO2: 95% 98% 98%   Weight:    207 lb 14.4 oz (94.3 kg)  Height:        Intake/Output Summary (Last 24 hours) at 10/11/16 0758 Last data filed at 10/11/16 0700  Gross per 24 hour  Intake              537 ml  Output              850 ml  Net             -313 ml   Filed Weights   10/09/16 1700 10/10/16 0500 10/11/16 0400  Weight: 214 lb (97.1 kg) 208 lb 6.4 oz (94.5 kg) 207 lb 14.4 oz (94.3 kg)    Telemetry    SR - Personally Reviewed  ECG    SR - Personally Reviewed  Physical Exam   General: Well developed, well nourished, male appearing in no acute distress. Head: Normocephalic, atraumatic.  Neck: Supple without bruits, JVD. Lungs:  Resp regular and unlabored, CTA. Heart: RRR, S1, S2, no S3, S4, soft systolic murmur; no rub. Abdomen: Soft, non-tender, non-distended with normoactive bowel sounds. No hepatomegaly. No rebound/guarding. No obvious abdominal masses. Extremities: No  clubbing, cyanosis, edema. Distal pedal pulses are 2+ bilaterally. Neuro: Alert and oriented X 3. Moves all extremities spontaneously. Psych: Normal affect.  Labs    Chemistry Recent Labs Lab 10/09/16 1952 10/10/16 0739  NA 140 138  K 3.1* 3.8  CL 100* 101  CO2 31 27  GLUCOSE 167* 191*  BUN 10 9  CREATININE 0.84 0.73  CALCIUM 8.6* 8.3*  PROT 6.7  --   ALBUMIN 3.3*  --   AST 20  --   ALT 19  --   ALKPHOS 41  --   BILITOT 0.9  --   GFRNONAA >60 >60  GFRAA >60 >60  ANIONGAP 9 10     Hematology Recent Labs Lab 10/09/16 1952 10/10/16 0215 10/11/16 0209  WBC 11.2* 9.1 9.4  RBC 4.59 4.33 4.30  HGB 13.8 13.1 13.0  HCT 41.7 39.5 38.9*  MCV 90.8 91.2 90.5  MCH 30.1 30.3 30.2  MCHC 33.1 33.2 33.4  RDW 12.4 12.2 12.4  PLT 185 165 195    Cardiac Enzymes Recent Labs Lab 10/09/16 1952  10/10/16 0215 10/10/16 0739 10/10/16 1137  TROPONINI 0.46* 0.52* 0.66* 0.65*   No results for input(s): TROPIPOC in the last 168 hours.   BNP Recent Labs Lab 10/09/16 1952  BNP 126.4*     DDimer No results for input(s): DDIMER in the last 168 hours.    Radiology    No results found.  Cardiac Studies   TTE: 10/10/16  Study Conclusions  - Left ventricle: The cavity size was normal. Wall thickness was   increased in a pattern of moderate LVH. Systolic function was   normal. The estimated ejection fraction was in the range of 60%   to 65%. Wall motion was normal; there were no regional wall   motion abnormalities. - Aortic valve: Moderately to severely calcified annulus.   Trileaflet; moderately thickened leaflets. There was mild   stenosis. There was mild regurgitation. Mean gradient (S): 9 mm   Hg. Valve area (VTI): 1.87 cm^2. Valve area (Vmax): 1.66 cm^2. - Mitral valve: Mildly to moderately calcified annulus. Mildly   thickened leaflets .  Patient Profile     76 y.o. 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  Now admitted with similar chest pain. Positive enzymes.   Assessment & Plan    1. Unstable Angina: Troponin peak of 0.66. Home medications were adjusted this admission, Imdur 60mg , amlodipine 10mg , and Coreg 25mg  BID. Reports this pain was very similar to what he experienced with his cath earlier this year, but his breathing was worse this time. Given findings will plan for cardiac cath today.  -- continue IV heparin, ASA, plavix, BB, and statin -- The patient understands that risks included but are not limited to stroke (1 in 1000), death (1 in 1000), kidney failure [usually temporary] (1 in 500), bleeding (1 in 200), allergic reaction [possibly serious] (1 in 200).  -- echo with normal EF and no WMA.   2. CAD: Had a POBA 3/18 with residual non obstructive disease in other vessels. On good medical therapy.   3. HL: LDL 48 -- on statin   4. DM: Hgb 7.2  -- SSI   5. Hypokalemia: Resolved    Signed, Reino Bellis, NP  10/11/2016, 7:58 AM  Pager # [phone]   Patient seen and examined and history reviewed. Agree with above findings and plan. Patient is pain free. States symptoms the same as before but with more dyspnea. Troponin mildly elevated. Ecg without acute change. Plan for cardiac cath today. 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 POBA in March of this year (felt original stent undersized). Would recommend femoral approach given difficulty from radial approach before.   Peter [place], [place] 10/11/2016 10:50 AM     For questions or updates, please contact [facility] Please consult [email or web address] for contact info under Cardiology/STEMI. Daytime calls, contact the Day Call APP (6a-8a) or assigned team (Teams A-D) provider (7:30a - 5p). All other daytime calls (7:30-5p), contact the Card Master @ [phone].   Nighttime calls, contact the assigned APP (5p-8p) or MD (6:30p-8p). Overnight calls (8p-6a), contact the  on call Fellow @ [phone].

## 2016-10-11 NOTE — Progress Notes (Signed)
ANTICOAGULATION CONSULT NOTE  Pharmacy Consult for Heparin Indication: chest pain/ACS  Patient Measurements: Height: 5\' 10"  (177.8 cm) Weight: 207 lb 14.4 oz (94.3 kg) IBW/kg (Calculated) : 73 Heparin Dosing Weight: 93 kg  Vital Signs: Temp: 98.7 F (37.1 C) (09/17 0400) Temp Source: Oral (09/17 0400) BP: 136/76 (09/17 0400) Pulse Rate: 73 (09/16 2148)  Labs:  Recent Labs  10/09/16 1952 10/10/16 0215 10/10/16 0739 10/10/16 1137 10/11/16 0209  HGB 13.8 13.1  --   --  13.0  HCT 41.7 39.5  --   --  38.9*  PLT 185 165  --   --  195  LABPROT 13.7  --   --   --   --   INR 1.06  --   --   --   --   HEPARINUNFRC  --  0.12*  --  0.50 0.39  CREATININE 0.84  --  0.73  --   --   TROPONINI 0.46* 0.52* 0.66* 0.65*  --     Estimated Creatinine Clearance: 92 mL/min (by C-G formula based on SCr of 0.73 mg/dL).  Assessment: 76 yoM on heparin for chest pain with elevated troponin. Appears that patients pain is resolved when he is at rest. Remains on heparin while decision is being made for stress test vs cath lab. Heparin level is therapeutic. hgb 13, plts wnl.   Goal of Therapy:  Heparin level 0.3-0.7 units/ml Monitor platelets by anticoagulation protocol: Yes    Plan:  1) Continue heparin at 1,350 units/hour 2) Daily heparin level and CBC 3) F/u cardiology plans    Hughes Better, PharmD, BCPS Clinical Pharmacist 10/11/2016 8:25 AM

## 2016-10-11 NOTE — Progress Notes (Signed)
Progress Note  Patient Name: Sean Villarreal Date of Encounter: 10/11/2016  Primary Cardiologist: Domenic Polite   Subjective   No further chest pain  Inpatient Medications    Scheduled Meds: . amLODipine  10 mg Oral Daily  . aspirin EC  81 mg Oral Daily  . atorvastatin  80 mg Oral QHS  . carvedilol  25 mg Oral BID WC  . clopidogrel  75 mg Oral Daily  . furosemide  20 mg Oral QODAY  . glimepiride  1 mg Oral Q breakfast  . isosorbide mononitrate  60 mg Oral Daily  . lisinopril  40 mg Oral Daily  . potassium chloride SA  20 mEq Oral Daily  . tamsulosin  0.4 mg Oral BID   Continuous Infusions: . heparin 1,350 Units/hr (10/10/16 1500)   PRN Meds: acetaminophen, Influenza vac split quadrivalent PF, nitroGLYCERIN, ondansetron (ZOFRAN) IV   Vital Signs    Vitals:   10/10/16 0610 10/10/16 1607 10/10/16 2148 10/11/16 0400  BP: 107/64 123/67 135/73 136/76  Pulse: 70 75 73   Resp:   18 18  Temp:  98.5 F (36.9 C) 98.4 F (36.9 C) 98.7 F (37.1 C)  TempSrc:  Oral Oral Oral  SpO2: 95% 98% 98%   Weight:    207 lb 14.4 oz (94.3 kg)  Height:        Intake/Output Summary (Last 24 hours) at 10/11/16 0758 Last data filed at 10/11/16 0700  Gross per 24 hour  Intake              537 ml  Output              850 ml  Net             -313 ml   Filed Weights   10/09/16 1700 10/10/16 0500 10/11/16 0400  Weight: 214 lb (97.1 kg) 208 lb 6.4 oz (94.5 kg) 207 lb 14.4 oz (94.3 kg)    Telemetry    SR - Personally Reviewed  ECG    SR - Personally Reviewed  Physical Exam   General: Well developed, well nourished, male appearing in no acute distress. Head: Normocephalic, atraumatic.  Neck: Supple without bruits, JVD. Lungs:  Resp regular and unlabored, CTA. Heart: RRR, S1, S2, no S3, S4, soft systolic murmur; no rub. Abdomen: Soft, non-tender, non-distended with normoactive bowel sounds. No hepatomegaly. No rebound/guarding. No obvious abdominal masses. Extremities: No  clubbing, cyanosis, edema. Distal pedal pulses are 2+ bilaterally. Neuro: Alert and oriented X 3. Moves all extremities spontaneously. Psych: Normal affect.  Labs    Chemistry Recent Labs Lab 10/09/16 1952 10/10/16 0739  NA 140 138  K 3.1* 3.8  CL 100* 101  CO2 31 27  GLUCOSE 167* 191*  BUN 10 9  CREATININE 0.84 0.73  CALCIUM 8.6* 8.3*  PROT 6.7  --   ALBUMIN 3.3*  --   AST 20  --   ALT 19  --   ALKPHOS 41  --   BILITOT 0.9  --   GFRNONAA >60 >60  GFRAA >60 >60  ANIONGAP 9 10     Hematology Recent Labs Lab 10/09/16 1952 10/10/16 0215 10/11/16 0209  WBC 11.2* 9.1 9.4  RBC 4.59 4.33 4.30  HGB 13.8 13.1 13.0  HCT 41.7 39.5 38.9*  MCV 90.8 91.2 90.5  MCH 30.1 30.3 30.2  MCHC 33.1 33.2 33.4  RDW 12.4 12.2 12.4  PLT 185 165 195    Cardiac Enzymes Recent Labs Lab 10/09/16 1952  10/10/16 0215 10/10/16 0739 10/10/16 1137  TROPONINI 0.46* 0.52* 0.66* 0.65*   No results for input(s): TROPIPOC in the last 168 hours.   BNP Recent Labs Lab 10/09/16 1952  BNP 126.4*     DDimer No results for input(s): DDIMER in the last 168 hours.    Radiology    No results found.  Cardiac Studies   TTE: 10/10/16  Study Conclusions  - Left ventricle: The cavity size was normal. Wall thickness was   increased in a pattern of moderate LVH. Systolic function was   normal. The estimated ejection fraction was in the range of 60%   to 65%. Wall motion was normal; there were no regional wall   motion abnormalities. - Aortic valve: Moderately to severely calcified annulus.   Trileaflet; moderately thickened leaflets. There was mild   stenosis. There was mild regurgitation. Mean gradient (S): 9 mm   Hg. Valve area (VTI): 1.87 cm^2. Valve area (Vmax): 1.66 cm^2. - Mitral valve: Mildly to moderately calcified annulus. Mildly   thickened leaflets .  Patient Profile     76 y.o. 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  Now admitted with similar chest pain. Positive enzymes.   Assessment & Plan    1. Unstable Angina: Troponin peak of 0.66. Home medications were adjusted this admission, Imdur 60mg , amlodipine 10mg , and Coreg 25mg  BID. Reports this pain was very similar to what he experienced with his cath earlier this year, but his breathing was worse this time. Given findings will plan for cardiac cath today.  -- continue IV heparin, ASA, plavix, BB, and statin -- The patient understands that risks included but are not limited to stroke (1 in 1000), death (1 in 1000), kidney failure [usually temporary] (1 in 500), bleeding (1 in 200), allergic reaction [possibly serious] (1 in 200).  -- echo with normal EF and no WMA.   2. CAD: Had a POBA 3/18 with residual non obstructive disease in other vessels. On good medical therapy.   3. HL: LDL 48 -- on statin   4. DM: Hgb 7.2  -- SSI   5. Hypokalemia: Resolved    Signed, Reino Bellis, NP  10/11/2016, 7:58 AM  Pager # [phone]   Patient seen and examined and history reviewed. Agree with above findings and plan. Patient is pain free. States symptoms the same as before but with more dyspnea. Troponin mildly elevated. Ecg without acute change. Plan for cardiac cath today. 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 POBA in March of this year (felt original stent undersized). Would recommend femoral approach given difficulty from radial approach before.   Cashe Gatt [place], [place] 10/11/2016 10:50 AM     For questions or updates, please contact [facility] Please consult [email or web address] for contact info under Cardiology/STEMI. Daytime calls, contact the Day Call APP (6a-8a) or assigned team (Teams A-D) provider (7:30a - 5p). All other daytime calls (7:30-5p), contact the Card Master @ [phone].   Nighttime calls, contact the assigned APP (5p-8p) or MD (6:30p-8p). Overnight calls (8p-6a), contact the  on call Fellow @ [phone].

## 2016-10-12 ENCOUNTER — Encounter (HOSPITAL_COMMUNITY): Payer: Self-pay | Admitting: Interventional Cardiology

## 2016-10-12 LAB — CBC
HCT: 44.2 % (ref 39.0–52.0)
Hemoglobin: 15.1 g/dL (ref 13.0–17.0)
MCH: 30.5 pg (ref 26.0–34.0)
MCHC: 34.2 g/dL (ref 30.0–36.0)
MCV: 89.3 fL (ref 78.0–100.0)
PLATELETS: 205 10*3/uL (ref 150–400)
RBC: 4.95 MIL/uL (ref 4.22–5.81)
RDW: 12.2 % (ref 11.5–15.5)
WBC: 11.8 10*3/uL — AB (ref 4.0–10.5)

## 2016-10-12 LAB — BASIC METABOLIC PANEL
Anion gap: 8 (ref 5–15)
BUN: 11 mg/dL (ref 6–20)
CALCIUM: 8.5 mg/dL — AB (ref 8.9–10.3)
CO2: 24 mmol/L (ref 22–32)
CREATININE: 0.82 mg/dL (ref 0.61–1.24)
Chloride: 106 mmol/L (ref 101–111)
GFR calc Af Amer: >60 mL/min (ref >60)
GFR calc non Af Amer: >60 mL/min (ref >60)
Glucose, Bld: 234 mg/dL — ABNORMAL HIGH (ref 65–99)
Potassium: 4 mmol/L (ref 3.5–5.1)
Sodium: 138 mmol/L (ref 135–145)

## 2016-10-12 LAB — GLUCOSE, CAPILLARY
GLUCOSE-CAPILLARY: 205 mg/dL — AB (ref 65–99)
Glucose-Capillary: 321 mg/dL — ABNORMAL HIGH (ref 65–99)

## 2016-10-12 MED ORDER — PANTOPRAZOLE SODIUM 40 MG PO TBEC: 40.0000 mg | DELAYED_RELEASE_TABLET | Freq: Every day | ORAL | 1 refills | Status: DC

## 2016-10-12 MED ORDER — INFLUENZA VAC SPLIT QUAD 0.5 ML IM SUSY: 0.5000 mL | PREFILLED_SYRINGE | INTRAMUSCULAR | Status: DC

## 2016-10-12 MED ORDER — INFLUENZA VAC SPLIT QUAD 0.5 ML IM SUSY
0.5000 mL | PREFILLED_SYRINGE | Freq: Once | INTRAMUSCULAR | Status: AC
  Administered 2016-10-12: 0.5 mL via INTRAMUSCULAR

## 2016-10-12 MED ORDER — ISOSORBIDE MONONITRATE ER 30 MG PO TB24: 30.0000 mg | ORAL_TABLET | Freq: Every day | ORAL | Status: DC

## 2016-10-12 NOTE — Progress Notes (Signed)
Progress Note  Patient Name: Sean Villarreal Date of Encounter: 10/12/2016  Primary Cardiologist: Domenic Polite   Subjective   No further chest pain. Notes breathing is much better.   Inpatient Medications    Scheduled Meds: . amLODipine  10 mg Oral Daily  . aspirin  81 mg Oral Daily  . atorvastatin  80 mg Oral QHS  . carvedilol  25 mg Oral BID WC  . clopidogrel  75 mg Oral Q breakfast  . furosemide  20 mg Oral QODAY  . glimepiride  1 mg Oral Q breakfast  . [START ON 10/13/2016] isosorbide mononitrate  30 mg Oral Daily  . lisinopril  40 mg Oral Daily  . potassium chloride SA  20 mEq Oral Daily  . sodium chloride flush  3 mL Intravenous Q12H  . tamsulosin  0.4 mg Oral BID   Continuous Infusions: . sodium chloride     PRN Meds: sodium chloride, acetaminophen, Influenza vac split quadrivalent PF, nitroGLYCERIN, ondansetron (ZOFRAN) IV, sodium chloride flush   Vital Signs    Vitals:   10/12/16 0000 10/12/16 0200 10/12/16 0400 10/12/16 0700  BP: 128/71 115/64 127/69 (!) 142/77  Pulse: 89 87 89 95  Resp: _0 Temp:   98.2 F (36.8 C) 98.6 F (37 C)  TempSrc:   Oral Oral  SpO2: 97% 97% 98%   Weight:   206 lb 2.1 oz (93.5 kg)   Height:        Intake/Output Summary (Last 24 hours) at 10/12/16 1106 Last data filed at 10/12/16 0900  Gross per 24 hour  Intake          1127.32 ml  Output             1525 ml  Net          -397.68 ml   Filed Weights   10/11/16 0400 10/11/16 1915 10/12/16 0400  Weight: 207 lb 14.4 oz (94.3 kg) 205 lb 14.6 oz (93.4 kg) 206 lb 2.1 oz (93.5 kg)    Telemetry    SR - Personally Reviewed  ECG    SR, normal. - Personally Reviewed  Physical Exam   General: Well developed, well nourished, male appearing in no acute distress. Head: Normocephalic, atraumatic.  Neck: Supple without bruits, JVD. Lungs:  Resp regular and unlabored, CTA. Heart: RRR, S1, S2, no S3, S4, soft systolic murmur; no rub. Abdomen: Soft, non-tender,  non-distended with normoactive bowel sounds. No hepatomegaly. No rebound/guarding. No obvious abdominal masses. Extremities: No clubbing, cyanosis, edema. Distal pedal pulses are 2+ bilaterally. Right groin is without hematoma. Neuro: Alert and oriented X 3. Moves all extremities spontaneously. Psych: Normal affect.  Labs    Chemistry  Recent Labs Lab 10/09/16 1952 10/10/16 0739 10/12/16 0237  NA 140 138 138  K 3.1* 3.8 4.0  CL 100* 101 106  CO2 _1 GLUCOSE 167* 191* 234*  BUN _2 CREATININE 0.84 0.73 0.82  CALCIUM 8.6* 8.3* 8.5*  PROT 6.7  --   --   ALBUMIN 3.3*  --   --   AST 20  --   --   ALT 19  --   --   ALKPHOS 41  --   --   BILITOT 0.9  --   --   GFRNONAA >60 >60 >60  GFRAA >60 >60 >60  ANIONGAP _3 Hematology  Recent Labs Lab 10/10/16 0215 10/11/16 5320 10/12/16 2334  WBC 9.1 9.4 11.8*  RBC 4.33 4.30 4.95  HGB 13.1 13.0 15.1  HCT 39.5 38.9* 44.2  MCV 91.2 90.5 89.3  MCH 30.3 30.2 30.5  MCHC 33.2 33.4 34.2  RDW 12.2 12.4 12.2  PLT 165 195 205    Cardiac Enzymes  Recent Labs Lab 10/09/16 1952 10/10/16 0215 10/10/16 0739 10/10/16 1137  TROPONINI 0.46* 0.52* 0.66* 0.65*   No results for input(s): TROPIPOC in the last 168 hours.   BNP  Recent Labs Lab 10/09/16 1952  BNP 126.4*     DDimer No results for input(s): DDIMER in the last 168 hours.    Radiology    No results found.  Cardiac Studies   TTE: 10/10/16  Study Conclusions  - Left ventricle: The cavity size was normal. Wall thickness was   increased in a pattern of moderate LVH. Systolic function was   normal. The estimated ejection fraction was in the range of 60%   to 65%. Wall motion was normal; there were no regional wall   motion abnormalities. - Aortic valve: Moderately to severely calcified annulus.   Trileaflet; moderately thickened leaflets. There was mild   stenosis. There was mild regurgitation. Mean gradient (S): 9 mm   Hg. Valve area (VTI):  1.87 cm^2. Valve area (Vmax): 1.66 cm^2. - Mitral valve: Mildly to moderately calcified annulus. Mildly   thickened leaflets .  Procedures   CORONARY STENT INTERVENTION  LEFT HEART CATH AND CORONARY ANGIOGRAPHY  Conclusion     Prox RCA to Mid RCA lesion, 15 %stenosed.  Patent LAD stent.  Ost LAD to Prox LAD lesion, 10 %stenosed.  Mid LAD to Dist LAD lesion, 60 %stenosed, unchanged from prior.  Ost Ramus lesion, 100 %stenosed. There are left to left collaterals.  Prox Cx to Mid Cx lesion, 70 %stenosed. A STENT SYNERGY DES 2.5X12 drug eluting stent was successfully placed.  Post intervention, there is a 0% residual stenosis.  Ost Cx to Prox Cx lesion, 95 %stenosed. A STENT SYNERGY DES 3X16 drug eluting stent was successfully placed, postdilated to 3.5 mm.  Post intervention, there is a 0% residual stenosis.  The left ventricular systolic function is normal.  LV end diastolic pressure is normal.  The left ventricular ejection fraction is 55-65% by visual estimate.  There is no aortic valve stenosis.   Continue dual antiplatelet therapy for at least a year, and perhaps beyond.  Synergy stent was used given bioabsorbable polymer and the hopes this would decrease restenosis.   Continue aggressive secondary prevention.    Possible discharge in AM.    Indications   Non-ST elevation (NSTEMI) myocardial infarction ([place]) [I21.4 (ICD-10-CM)]  Procedural Details/Technique   Technical Details The risks, benefits, and details of the procedure were explained to the patient. The patient verbalized understanding and wanted to proceed. Informed written consent was obtained.  PROCEDURE TECHNIQUE: After Xylocaine anesthesia, a 4F sheath was placed in the right femoral artery with a single anterior needle wall stick using ultrasound guidance. Left coronary angiography was done using a Judkins L4 guide catheter. Right coronary angiography was done using a Judkins R4 guide catheter.  Left heart cath/ventriculogram was done using a JR4 catheter.   IV angiomax was used for anticoagulation. ACT was used to check that the anticoagulation was therapeutic.    Contrast: 160 cc   Estimated blood loss <50 mL.  During this procedure the patient was administered the following to achieve and maintain moderate conscious sedation: Versed 2 mg, Fentanyl 25 mcg, while the  patient's heart rate, blood pressure, and oxygen saturation were continuously monitored. The period of conscious sedation was 81 minutes, of which I was present face-to-face 100% of this time.    Complications   Complications documented before study signed (10/11/2016 7:27 PM EDT)    No complications were associated with this study.  Documented by Jettie Booze, MD - 10/11/2016 6:36 PM EDT    Coronary Findings   Dominance: Right  Left Main  Vessel is normal in caliber. Vessel is angiographically normal.  Left Anterior Descending  Ost LAD to Prox LAD lesion, 10% stenosed. The lesion is severely calcified.  Prox LAD to Mid LAD lesion, 0% stenosed. Previously placed Prox LAD to Mid LAD drug eluting stent is widely patent.  Mid LAD to Dist LAD lesion, 60% stenosed.  Ramus Intermedius  Vessel is small. Ramus filled by collaterals from Ost 2nd Sept.  Ost Ramus lesion, 100% stenosed.  Left Circumflex  Ost Cx to Prox Cx lesion, 95% stenosed. The lesion was previously treated using a drug eluting stent over 2 years ago.  Angioplasty: Lesion crossed with guidewire using a WIRE ASAHI PROWATER 180CM. Pre-stent angioplasty was performed using a BALLOON SAPPHIRE 2.5X12. A STENT SYNERGY DES 3X16 drug eluting stent was successfully placed. Stent strut is well apposed. Post-stent angioplasty was performed using a BALLOON SAPPHIRE [place] 3.5X8. Maximum pressure: 16 atm. The pre-interventional distal flow is normal (TIMI 3). The post-interventional distal flow is normal (TIMI 3). No complications occurred at this lesion.    There is no residual stenosis post intervention.  Prox Cx to Mid Cx lesion, 70% stenosed.  Angioplasty: Lesion crossed with guidewire using a WIRE ASAHI PROWATER 180CM. Pre-stent angioplasty was performed using a BALLOON SAPPHIRE 2.5X12. A STENT SYNERGY DES 2.5X12 drug eluting stent was successfully placed. Stent strut is well apposed. Post-stent angioplasty was performed using a BALLOON SAPPHIRE [place] 3.5X8. Maximum pressure: 8 atm.  There is no residual stenosis post intervention.  Second Obtuse Marginal Branch  Vessel is large in size.  Right Coronary Artery  Prox RCA to Mid RCA lesion, 15% stenosed. The lesion is severely calcified.  Wall Motion   Resting               Left Heart   Left Ventricle The left ventricular size is normal. The left ventricular systolic function is normal. LV end diastolic pressure is normal. The left ventricular ejection fraction is 55-65% by visual estimate. No regional wall motion abnormalities.    Aortic Valve There is no aortic valve stenosis.    Coronary Diagrams   Diagnostic Diagram       Post-Intervention Diagram          Patient Profile     76 y.o. male  with a hx of NSTEMI in 2014 and prior to the POBA in March of this year- ostium of LCX and residual non obstructive disease.  Now admitted with similar chest pain. Positive enzymes.   Assessment & Plan    1. Unstable Angina: Troponin peak of 0.66. Cardiac cath showed severe in stent restenosis at the ostium of the LCx and moderately severe stenosis in the mid LCx. Both lesions successfully stented.  -- continue  ASA, plavix, BB, and statin -- echo with normal EF and no WMA.   Patient is stable for DC today. Has follow up appointment next week with Dr. Domenic Polite.  2. CAD:  On good medical therapy.   3. HL: LDL 48 -- on statin   4. DM:  Hgb 7.2  -- SSI   5. Hypokalemia: Resolved    Signed,  Muriah Harsha [place], [place] 10/12/2016 11:06 AM     For questions or updates, please  contact [facility] Please consult [email or web address] for contact info under Cardiology/STEMI. Daytime calls, contact the Day Call APP (6a-8a) or assigned team (Teams A-D) provider (7:30a - 5p). All other daytime calls (7:30-5p), contact the Card Master @ [phone].   Nighttime calls, contact the assigned APP (5p-8p) or MD (6:30p-8p). Overnight calls (8p-6a), contact the on call Fellow @ [phone].

## 2016-10-12 NOTE — Progress Notes (Signed)
CARDIAC REHAB PHASE I   PRE:  Rate/Rhythm: 104 ST  BP:  Sitting: 113/74        SaO2: 98 RA  MODE:  Ambulation: 650 ft   POST:  Rate/Rhythm: 113 ST  BP:  Sitting: 124/79         SaO2: 97 RA  Pt ambulated 650 ft on RA, independent, steady gait, tolerated well with no complaints. Completed MI/stent education.  Reviewed risk factors, MI book, anti-platelet therapy, stent card, activity restrictions, ntg, exercise, heart healthy and diabetes diet handouts and phase 2 cardiac rehab. Pt verbalized understanding, receptive to education. Pt agrees to phase 2 cardiac rehab referral, will send to [place], [place] per pt request. Pt to recliner after walk, call bell within reach.   [place], RN, BSN 10/12/2016 11:35 AM

## 2016-10-12 NOTE — Progress Notes (Signed)
[id number]  amaryl po given.Discharged home by wheelchair, accompanied by wife, discharge instructions given to pt. Belongings taken home.

## 2016-10-12 NOTE — Discharge Summary (Signed)
Discharge Summary    Patient ID: Sean Villarreal,  MRN: [id number], DOB/AGE: [birth year] 75 y.o.  Admit date: 10/09/2016 Discharge date: 10/12/2016  Primary Care Provider: Rory Percy Primary Cardiologist: Domenic Polite   Discharge Diagnoses    Principal Problem:   Non-ST elevation (NSTEMI) myocardial infarction [facility]) Active Problems:   Coronary artery disease   DM (diabetes mellitus) ([place])   Hypertension   Dyslipidemia   Allergies Allergies  Allergen Reactions  . Penicillins Other (See Comments)    Passed out as a child Has patient had a PCN reaction causing immediate rash, facial/tongue/throat swelling, SOB or lightheadedness with hypotension: Unk Has patient had a PCN reaction causing severe rash involving mucus membranes or skin necrosis: Unk Has patient had a PCN reaction that required hospitalization: Unk Has patient had a PCN reaction occurring within the last 10 years: No If all of the above answers are "NO", then may proceed with Cephalosporin use.   . Contrast Media [Iodinated Diagnostic Agents] Other (See Comments)    Had "welts" on arm, and kidney issues after given dye in the past    Diagnostic Studies/Procedures    Cath: 10/11/16  Conclusion     Prox RCA to Mid RCA lesion, 15 %stenosed.  Patent LAD stent.  Ost LAD to Prox LAD lesion, 10 %stenosed.  Mid LAD to Dist LAD lesion, 60 %stenosed, unchanged from prior.  Ost Ramus lesion, 100 %stenosed. There are left to left collaterals.  Prox Cx to Mid Cx lesion, 70 %stenosed. A STENT SYNERGY DES 2.5X12 drug eluting stent was successfully placed.  Post intervention, there is a 0% residual stenosis.  Ost Cx to Prox Cx lesion, 95 %stenosed. A STENT SYNERGY DES 3X16 drug eluting stent was successfully placed, postdilated to 3.5 mm.  Post intervention, there is a 0% residual stenosis.  The left ventricular systolic function is normal.  LV end diastolic pressure is normal.  The left ventricular  ejection fraction is 55-65% by visual estimate.  There is no aortic valve stenosis.   Continue dual antiplatelet therapy for at least a year, and perhaps beyond.  Synergy stent was used given bioabsorbable polymer and the hopes this would decrease restenosis.   Continue aggressive secondary prevention.     TTE: 10/10/16  Study Conclusions  - Left ventricle: The cavity size was normal. Wall thickness was   increased in a pattern of moderate LVH. Systolic function was   normal. The estimated ejection fraction was in the range of 60%   to 65%. Wall motion was normal; there were no regional wall   motion abnormalities. - Aortic valve: Moderately to severely calcified annulus.   Trileaflet; moderately thickened leaflets. There was mild   stenosis. There was mild regurgitation. Mean gradient (S): 9 mm   Hg. Valve area (VTI): 1.87 cm^2. Valve area (Vmax): 1.66 cm^2. - Mitral valve: Mildly to moderately calcified annulus. Mildly   thickened leaflets . _____________   History of Present Illness     Sean Villarreal is a 76 y/o caucasian M who had L sided chest pain, sharp, pressure like for the past 4 days, and associated with SOB prior to admission. He claimed that 2 days prior, he had significant L sided chest pressure and shortness of breath that woke him from his sleep at 3:30 am. Due to this, he went to an OSH and received sublingual NTG which improved his pain. He claimed this was the same type of pain he experienced at the time of his NSTEMI  in 2014 and prior to the POBA in march of this year. He claimed that his BP was stable at home in the 626'R -485'I systolic. He stated that after the POBA in march of 2018, his symptoms improved, but recently recurred about 4 days ago. Symptoms are not necessarily exertional, but can occur when he performs activities. He denied any lower extremity edema, orthopnea, dizziness or syncope. Given symptoms he was admitted for further work up.  Hospital Course      Plan was initially to increase his medical therapy, with troponins noted to be positive with peak of 0.66. Given his ISR back in march, decision was made to proceed with cardiac cath. Underwent cath with Dr. Irish Lack noted above with PCI/DES x1 to ISR of the osital Lcx, and DESx1 to p/m Lcx. Plan to continue with DAPT with ASA/plavix for at least a year, likely longer. Post cath labs showed Cr 0.82 and Hgb 15.1. No complications noted. On good medical therapy prior to admission. Echo with normal EF. Hgb a1c 7.2 this admission, and LDL 48. Worked well with cardiac rehab without recurrent chest pain.   Sean Villarreal was seen by Dr. Martinique and determined stable for discharge home. Follow up in the office has been arranged. Medications are listed below.   _____________  Discharge Vitals Blood pressure 113/74, pulse 95, temperature 97.7 F (36.5 C), temperature source Oral, resp. rate 19, height 5\' 10"  (1.778 m), weight 206 lb 2.1 oz (93.5 kg), SpO2 98 %.  Filed Weights   10/11/16 0400 10/11/16 1915 10/12/16 0400  Weight: 207 lb 14.4 oz (94.3 kg) 205 lb 14.6 oz (93.4 kg) 206 lb 2.1 oz (93.5 kg)    Labs & Radiologic Studies    CBC  Recent Labs  10/09/16 1952  10/11/16 0209 10/12/16 0237  WBC 11.2*  < > 9.4 11.8*  NEUTROABS 7.4  --   --   --   HGB 13.8  < > 13.0 15.1  HCT 41.7  < > 38.9* 44.2  MCV 90.8  < > 90.5 89.3  PLT 185  < > 195 205  < > = values in this interval not displayed. Basic Metabolic Panel  Recent Labs  10/10/16 0215 10/10/16 0739 10/12/16 0237  NA  --  138 138  K  --  3.8 4.0  CL  --  101 106  CO2  --  27 24  GLUCOSE  --  191* 234*  BUN  --  9 11  CREATININE  --  0.73 0.82  CALCIUM  --  8.3* 8.5*  MG 1.7 2.1  --    Liver Function Tests  Recent Labs  10/09/16 1952  AST 20  ALT 19  ALKPHOS 41  BILITOT 0.9  PROT 6.7  ALBUMIN 3.3*   No results for input(s): LIPASE, AMYLASE in the last 72 hours. Cardiac Enzymes  Recent Labs  10/10/16 0215  10/10/16 0739 10/10/16 1137  TROPONINI 0.52* 0.66* 0.65*   BNP Invalid input(s): POCBNP D-Dimer No results for input(s): DDIMER in the last 72 hours. Hemoglobin A1C  Recent Labs  10/09/16 1952  HGBA1C 7.2*   Fasting Lipid Panel  Recent Labs  10/10/16 0215  CHOL 99  HDL 35*  LDLCALC 48  TRIG 80  CHOLHDL 2.8   Thyroid Function Tests  Recent Labs  10/09/16 1952  TSH 2.786   _____________  No results found. Disposition   Pt is being discharged home today in good condition.  Follow-up Plans & Appointments  Follow-up Information    Satira Sark, MD Follow up on 10/20/2016.   Specialty:  Cardiology Why:  at 9:20am for your follow up appt.  Contact information: [place] [address] [phone]          Discharge Instructions    Amb Referral to Cardiac Rehabilitation    Complete by:  As directed    Diagnosis:   NSTEMI Comment - to [place], [place] Coronary Stents     Call MD for:  redness, tenderness, or signs of infection (pain, swelling, redness, odor or green/yellow discharge around incision site)    Complete by:  As directed    Diet - low sodium heart healthy    Complete by:  As directed    Discharge instructions    Complete by:  As directed    Groin Site Care Refer to this sheet in the next few weeks. These instructions provide you with information on caring for yourself after your procedure. Your caregiver may also give you more specific instructions. Your treatment has been planned according to current medical practices, but problems sometimes occur. Call your caregiver if you have any problems or questions after your procedure. HOME CARE INSTRUCTIONS You may shower 24 hours after the procedure. Remove the bandage (dressing) and gently wash the site with plain soap and water. Gently pat the site dry.  Do not apply powder or lotion to the site.  Do not sit in a bathtub, swimming pool, or whirlpool for 5 to 7 days.  No bending,  squatting, or lifting anything over 10 pounds (4.5 kg) as directed by your caregiver.  Inspect the site at least twice daily.  Do not drive home if you are discharged the same day of the procedure. Have someone else drive you.  You may drive 24 hours after the procedure unless otherwise instructed by your caregiver.  What to expect: Any bruising will usually fade within 1 to 2 weeks.  Blood that collects in the tissue (hematoma) may be painful to the touch. It should usually decrease in size and tenderness within 1 to 2 weeks.  SEEK IMMEDIATE MEDICAL CARE IF: You have unusual pain at the groin site or down the affected leg.  You have redness, warmth, swelling, or pain at the groin site.  You have drainage (other than a small amount of blood on the dressing).  You have chills.  You have a fever or persistent symptoms for more than 72 hours.  You have a fever and your symptoms suddenly get worse.  Your leg becomes pale, cool, tingly, or numb.  You have heavy bleeding from the site. Hold pressure on the site. [name]  PLEASE DO NOT MISS ANY DOSES OF YOUR PLAVIX!!!!! Also keep a log of you blood pressures and bring back to your follow up appt. Please call the office with any questions.   Patients taking blood thinners should generally stay away from medicines like ibuprofen, Advil, Motrin, naproxen, and Aleve due to risk of stomach bleeding. You may take Tylenol as directed or talk to your primary doctor about alternatives.  Some studies suggest Prilosec/Omeprazole interacts with Plavix. We changed your Prilosec/Omeprazole to the equivalent dose of Protonix for less chance of interaction.   Increase activity slowly    Complete by:  As directed       Discharge Medications     Medication List    STOP taking these medications   NEXIUM 24HR 20 MG capsule Generic drug:  esomeprazole  TAKE these medications   amLODipine 10 MG tablet Commonly known as:  NORVASC Take 1 tablet (10 mg total)  by mouth daily.   aspirin 81 MG tablet Take 81 mg by mouth daily.   atorvastatin 80 MG tablet Commonly known as:  LIPITOR Take 80 mg by mouth at bedtime.   carvedilol 25 MG tablet Commonly known as:  COREG Take 1 tablet (25 mg total) by mouth 2 (two) times daily with a meal.   clopidogrel 75 MG tablet Commonly known as:  PLAVIX TAKE 1 TABLET BY MOUTH  DAILY   furosemide 20 MG tablet Commonly known as:  LASIX Take 1 tablet (20 mg total) by mouth every other day.   glimepiride 1 MG tablet Commonly known as:  AMARYL Take 1 mg by mouth daily with breakfast.   isosorbide mononitrate 30 MG 24 hr tablet Commonly known as:  IMDUR Take 1 tablet (30 mg total) by mouth daily.   ketoconazole 2 % cream Commonly known as:  NIZORAL Apply 1 application topically 2 (two) times daily as needed (apply to face as needed (uses during the winter)). For dry skin   lisinopril 40 MG tablet Commonly known as:  PRINIVIL,ZESTRIL Take 1 tablet (40 mg total) by mouth daily.   metFORMIN 1000 MG tablet Commonly known as:  GLUCOPHAGE Take 1 tablet (1,000 mg total) by mouth 2 (two) times daily.   nitroGLYCERIN 0.4 MG SL tablet Commonly known as:  NITROSTAT Place 1 tablet (0.4 mg total) under the tongue every 5 (five) minutes x 3 doses as needed for chest pain.   pantoprazole 40 MG tablet Commonly known as:  PROTONIX Take 1 tablet (40 mg total) by mouth daily.   potassium chloride SA 20 MEQ tablet Commonly known as:  K-DUR,KLOR-CON Take 1 tablet (20 mEq total) by mouth daily.   psyllium 28 % packet Commonly known as:  METAMUCIL SMOOTH TEXTURE Take 1 packet by mouth at bedtime.   tamsulosin 0.4 MG Caps capsule Commonly known as:  FLOMAX Take 0.4 mg by mouth 2 (two) times daily.        Aspirin prescribed at discharge?  Yes High Intensity Statin Prescribed? (Lipitor 40-80mg  or Crestor 20-40mg ): Yes Beta Blocker Prescribed? Yes For EF <40%, was ACEI/ARB Prescribed? Yes ADP Receptor  Inhibitor Prescribed? (i.e. Plavix etc.-Includes Medically Managed Patients): Yes For EF <40%, Aldosterone Inhibitor Prescribed? No: EF ok Was EF assessed during THIS hospitalization? Yes Was Cardiac Rehab II ordered? (Included Medically managed Patients): Yes   Outstanding Labs/Studies   N/a   Duration of Discharge Encounter   Greater than 30 minutes including physician time.  Signed, Reino Bellis NP-C 10/12/2016, 12:03 PM

## 2016-10-19 ENCOUNTER — Encounter: Payer: Self-pay | Admitting: Cardiology

## 2016-10-19 NOTE — Progress Notes (Signed)
Cardiology Office Note  Date: 10/20/2016   ID: Sean Villarreal, DOB 04/19/1940, MRN [id number]  PCP: Rory Percy, MD  Primary Cardiologist: Rozann Lesches, MD   Chief Complaint  Patient presents with  . Coronary Artery Disease    History of Present Illness: Sean Villarreal is a 76 y.o. male last seen in March. I reviewed interval records. He was recently hospitalized at [facility] with recurrent chest pain and evidence of NSTEMI with peak troponin I of 0.66. 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  He is here with his wife today for a follow-up visit. Reports significantly improved shortness of breath, and otherwise stable without chest pain. He does state that his right groin arteriotomy site has been sore recently, he tried to increase his walking to help reduce blood sugar and has had more soreness since then. No obvious bleeding or expanding ecchymosis. He also feels a "knot" in that area.  I reviewed his cardiac medications which are outlined below. Plan will be to continue present regimen including aspirin and Plavix, likely long-term.  Past Medical History:  Diagnosis Date  . Arthritis   . Basal cell carcinoma   . Coronary artery disease    DES to LAD and circumflex April 2014, PTCA ostial circumflex 03/2016 due to ISR, 9/18 PCI/DESx2 osital Lcx (ISR), p/mLCx   . Dyslipidemia   . Essential hypertension   . GERD (gastroesophageal reflux disease)   . Hematuria   . History of blood transfusion 09/2012  . History of kidney stones   . NSTEMI (non-ST elevated myocardial infarction) ([place]) 08/2012  . Type II diabetes mellitus ([place])     Past Surgical History:  Procedure Laterality Date  . BASAL CELL CARCINOMA EXCISION     "right cheek; both shoulders"  . CARDIAC CATHETERIZATION    . CATARACT EXTRACTION W/ INTRAOCULAR LENS  IMPLANT, BILATERAL Bilateral     . COLONOSCOPY N/A 03/19/2016   Procedure: COLONOSCOPY;  Surgeon: Rogene Houston, MD;  [facility];  Service: Endoscopy;  Laterality: N/A;  9:15  . CORONARY BALLOON ANGIOPLASTY N/A 04/15/2016   Procedure: Coronary Balloon Angioplasty;  Surgeon: Peter M Martinique, MD;  Location: [place] CV LAB;  Service: Cardiovascular;  Laterality: N/A;  . CORONARY STENT INTERVENTION N/A 10/11/2016   Procedure: CORONARY STENT INTERVENTION;  Surgeon: Jettie Booze, MD;  Location: [place] CV LAB;  Service: Cardiovascular;  Laterality: N/A;  . CYSTOSCOPY W/ URETEROSCOPY W/ LITHOTRIPSY  10/2012   Archie Endo 11/10/2012  . CYSTOSCOPY WITH STENT PLACEMENT  08/2012; 09/2012  . EYE SURGERY Left ~ 02/2016   "for crinkled lens"  . LEFT HEART CATH AND CORONARY ANGIOGRAPHY N/A 04/15/2016   Procedure: Left Heart Cath and Coronary Angiography;  Surgeon: Peter M Martinique, MD;  Location: [place] CV LAB;  Service: Cardiovascular;  Laterality: N/A;  . LEFT HEART CATH AND CORONARY ANGIOGRAPHY N/A 10/11/2016   Procedure: LEFT HEART CATH AND CORONARY ANGIOGRAPHY;  Surgeon: Jettie Booze, MD;  Location: [place] CV LAB;  Service: Cardiovascular;  Laterality: N/A;  . LEFT HEART CATHETERIZATION WITH CORONARY ANGIOGRAM N/A 05/05/2012   Procedure: LEFT HEART CATHETERIZATION WITH CORONARY ANGIOGRAM;  Surgeon: Burnell Blanks, MD;  Location: [facility] CATH LAB;  Service: Cardiovascular;  Laterality: N/A;  . LEFT HEART CATHETERIZATION WITH CORONARY ANGIOGRAM N/A 05/15/2012   Procedure: LEFT HEART CATHETERIZATION WITH CORONARY ANGIOGRAM;  Surgeon: Harrell Gave  Santina Evans, MD;  Location: [facility] CATH LAB;  Service: Cardiovascular;  Laterality: N/A;  . TONSILLECTOMY  1948    Current Outpatient Prescriptions  Medication Sig Dispense Refill  . amLODipine (NORVASC) 10 MG tablet Take 1 tablet (10 mg total) by mouth daily. 30 tablet 3  . aspirin 81 MG tablet Take 81 mg by mouth daily.    [name] atorvastatin (LIPITOR) 80 MG tablet  Take 80 mg by mouth at bedtime.    . carvedilol (COREG) 25 MG tablet Take 1 tablet (25 mg total) by mouth 2 (two) times daily with a meal. 60 tablet 11  . clopidogrel (PLAVIX) 75 MG tablet TAKE 1 TABLET BY MOUTH  DAILY 90 tablet 3  . furosemide (LASIX) 20 MG tablet Take 1 tablet (20 mg total) by mouth every other day. 45 tablet 3  . glimepiride (AMARYL) 1 MG tablet Take 1 mg by mouth daily with breakfast.     . isosorbide mononitrate (IMDUR) 30 MG 24 hr tablet Take 1 tablet (30 mg total) by mouth daily. 30 tablet 11  . ketoconazole (NIZORAL) 2 % cream Apply 1 application topically 2 (two) times daily as needed (apply to face as needed (uses during the winter)). For dry skin    . lisinopril (PRINIVIL,ZESTRIL) 40 MG tablet Take 1 tablet (40 mg total) by mouth daily. 30 tablet 3  . metFORMIN (GLUCOPHAGE) 1000 MG tablet Take 1 tablet (1,000 mg total) by mouth 2 (two) times daily.    . nitroGLYCERIN (NITROSTAT) 0.4 MG SL tablet Place 1 tablet (0.4 mg total) under the tongue every 5 (five) minutes x 3 doses as needed for chest pain. 25 tablet 3  . pantoprazole (PROTONIX) 40 MG tablet Take 1 tablet (40 mg total) by mouth daily. 30 tablet 1  . potassium chloride SA (K-DUR,KLOR-CON) 20 MEQ tablet Take 1 tablet (20 mEq total) by mouth daily. 30 tablet 11  . psyllium (METAMUCIL SMOOTH TEXTURE) 28 % packet Take 1 packet by mouth at bedtime.    . tamsulosin (FLOMAX) 0.4 MG CAPS capsule Take 0.4 mg by mouth 2 (two) times daily.      No current facility-administered medications for this visit.    Allergies:  Penicillins and Contrast media [iodinated diagnostic agents]   Social History: The patient  reports that he quit smoking about 27 years ago. His smoking use included Cigarettes. He started smoking about 34 years ago. He has a 22.50 pack-year smoking history. He has never used smokeless tobacco. He reports that he drinks alcohol. He reports that he does not use drugs.   ROS:  Please see the history of  present illness. Otherwise, complete review of systems is positive for none.  All other systems are reviewed and negative.   Physical Exam: VS:  BP 130/84   Pulse 85   Ht 5\' 10"  (1.778 m)   Wt 190 lb (86.2 kg)   SpO2 97%   BMI 27.26 kg/m , BMI Body mass index is 27.26 kg/m.  Wt Readings from Last 3 Encounters:  10/20/16 190 lb (86.2 kg)  10/12/16 206 lb 2.1 oz (93.5 kg)  04/23/16 217 lb (98.4 kg)    General: Overweight male, appears comfortable at rest. HEENT: Conjunctiva and lids normal, oropharynx clear. Neck: Supple, no elevated JVP or carotid bruits, no thyromegaly. Lungs: Clear to auscultation, nonlabored breathing at rest. Cardiac: Regular rate and rhythm, no S3, soft systolic murmur, no pericardial rub. Abdomen: Soft, nontender, bowel sounds present, no guarding or rebound. Extremities: No pitting  edema, distal pulses 2+. Right groin arteriotomy site with ecchymosis, no active bleeding. Regional fullness felt along inguinal ligament, nonpulsatile. Skin: Warm and dry. Musculoskeletal: No kyphosis. Neuropsychiatric: Alert and oriented x3, affect grossly appropriate.  ECG: I personally reviewed the tracing from 10/12/2016 which showed normal sinus rhythm.  Recent Labwork: 10/09/2016: ALT 19; AST 20; B Natriuretic Peptide 126.4; TSH 2.786 10/10/2016: Magnesium 2.1 10/12/2016: BUN 11; Creatinine, Ser 0.82; Hemoglobin 15.1; Platelets 205; Potassium 4.0; Sodium 138     Component Value Date/Time   CHOL 99 10/10/2016 0215   TRIG 80 10/10/2016 0215   HDL 35 (L) 10/10/2016 0215   CHOLHDL 2.8 10/10/2016 0215   VLDL 16 10/10/2016 0215   LDLCALC 48 10/10/2016 0215    Other Studies Reviewed Today:  Cardiac catheterization and PCI 10/11/2016:  Prox RCA to Mid RCA lesion, 15 %stenosed.  Patent LAD stent.  Ost LAD to Prox LAD lesion, 10 %stenosed.  Mid LAD to Dist LAD lesion, 60 %stenosed, unchanged from prior.  Ost Ramus lesion, 100 %stenosed. There are left to left  collaterals.  Prox Cx to Mid Cx lesion, 70 %stenosed. A STENT SYNERGY DES 2.5X12 drug eluting stent was successfully placed.  Post intervention, there is a 0% residual stenosis.  Ost Cx to Prox Cx lesion, 95 %stenosed. A STENT SYNERGY DES 3X16 drug eluting stent was successfully placed, postdilated to 3.5 mm.  Post intervention, there is a 0% residual stenosis.  The left ventricular systolic function is normal.  LV end diastolic pressure is normal.  The left ventricular ejection fraction is 55-65% by visual estimate.  There is no aortic valve stenosis.   Continue dual antiplatelet therapy for at least a year, and perhaps beyond.  Synergy stent was used given bioabsorbable polymer and the hopes this would decrease restenosis.   Continue aggressive secondary prevention.    Echocardiogram 10/10/2016: Study Conclusions  - Left ventricle: The cavity size was normal. Wall thickness was   increased in a pattern of moderate LVH. Systolic function was   normal. The estimated ejection fraction was in the range of 60%   to 65%. Wall motion was normal; there were no regional wall   motion abnormalities. - Aortic valve: Moderately to severely calcified annulus.   Trileaflet; moderately thickened leaflets. There was mild   stenosis. There was mild regurgitation. Mean gradient (S): 9 mm   Hg. Valve area (VTI): 1.87 cm^2. Valve area (Vmax): 1.66 cm^2. - Mitral valve: Mildly to moderately calcified annulus. Mildly   thickened leaflets .  Assessment and Plan:  1. CAD with recent NSTEMI and DES 2 to the circumflex in the setting of in-stent restenosis. Reports improved dyspnea on exertion, no angina symptoms at this time. Plan will be to continue medical therapy including long-term DAPT.  2. Right inguinal discomfort following arteriotomy. Ecchymosis does not appear to be expanding. He does have some fullness along the inguinal ligament but no unusual pulsatility. We will obtain a Doppler  examination to exclude pseudoaneurysm.  3. Essential hypertension, blood pressure control is adequate today. No changes made to current regimen.  4. Hyperlipidemia, continues on statin therapy. Recent LDL 48.  Current medicines were reviewed with the patient today.  Disposition: Follow-up in 3 months.  Signed, Satira Sark, MD, [facility] 10/20/2016 9:45 AM    [facility] [address], [place], [place] [address] Phone: [phone]; Fax: [phone]

## 2016-10-20 ENCOUNTER — Encounter: Payer: Self-pay | Admitting: Cardiology

## 2016-10-20 ENCOUNTER — Ambulatory Visit (INDEPENDENT_AMBULATORY_CARE_PROVIDER_SITE_OTHER): Payer: Medicare Other | Admitting: Cardiology

## 2016-10-20 VITALS — BP 130/84 | HR 85 | Ht 70.0 in | Wt 190.0 lb

## 2016-10-20 DIAGNOSIS — I9789 Other postprocedural complications and disorders of the circulatory system, not elsewhere classified: Secondary | ICD-10-CM

## 2016-10-20 DIAGNOSIS — I10 Essential (primary) hypertension: Secondary | ICD-10-CM | POA: Diagnosis not present

## 2016-10-20 DIAGNOSIS — I200 Unstable angina: Secondary | ICD-10-CM

## 2016-10-20 DIAGNOSIS — I729 Aneurysm of unspecified site: Secondary | ICD-10-CM

## 2016-10-20 DIAGNOSIS — I25119 Atherosclerotic heart disease of native coronary artery with unspecified angina pectoris: Secondary | ICD-10-CM | POA: Diagnosis not present

## 2016-10-20 DIAGNOSIS — T81718A Complication of other artery following a procedure, not elsewhere classified, initial encounter: Secondary | ICD-10-CM

## 2016-10-20 DIAGNOSIS — E782 Mixed hyperlipidemia: Secondary | ICD-10-CM | POA: Diagnosis not present

## 2016-10-20 DIAGNOSIS — I209 Angina pectoris, unspecified: Secondary | ICD-10-CM

## 2016-10-20 NOTE — Patient Instructions (Signed)
Medication Instructions:  Your physician recommends that you continue on your current medications as directed. Please refer to the Current Medication list given to you today.  Labwork: NONE  Testing/Procedures: Ultrasound of groin  Follow-Up: Your physician recommends that you schedule a follow-up appointment in: 3 months with Dr. Domenic Polite  Any Other Special Instructions Will Be Listed Below (If Applicable).  If you need a refill on your cardiac medications before your next appointment, please call your pharmacy.

## 2016-10-28 ENCOUNTER — Ambulatory Visit (INDEPENDENT_AMBULATORY_CARE_PROVIDER_SITE_OTHER): Payer: Medicare Other

## 2016-10-28 DIAGNOSIS — I9789 Other postprocedural complications and disorders of the circulatory system, not elsewhere classified: Secondary | ICD-10-CM | POA: Diagnosis not present

## 2016-10-28 DIAGNOSIS — I729 Aneurysm of unspecified site: Secondary | ICD-10-CM

## 2016-10-28 DIAGNOSIS — T81718A Complication of other artery following a procedure, not elsewhere classified, initial encounter: Principal | ICD-10-CM

## 2016-11-04 ENCOUNTER — Telehealth: Payer: Self-pay

## 2016-11-04 NOTE — Telephone Encounter (Signed)
Unable to LM

## 2016-11-04 NOTE — Telephone Encounter (Signed)
-----   Message from Acquanetta Chain, LPN sent at 18/03/4371  2:38 PM EDT -----   ----- Message ----- From: Satira Sark, MD Sent: 11/02/2016   1:19 PM To: Merlene Laughter, LPN  Results reviewed. Overall reassuring study. No evidence of pseudoaneurysm which was the main concern. A copy of this test should be forwarded to Rory Percy, MD.

## 2016-11-08 NOTE — Telephone Encounter (Signed)
LMTCB

## 2016-11-09 ENCOUNTER — Other Ambulatory Visit: Payer: Self-pay

## 2016-11-09 NOTE — Telephone Encounter (Signed)
Left message with patient. Unable to reach by phone after multiple attempts. Letter mailed to patient.

## 2016-11-09 NOTE — Telephone Encounter (Signed)
-----   Message from Acquanetta Chain, LPN sent at 47/01/8548  2:38 PM EDT -----   ----- Message ----- From: Satira Sark, MD Sent: 11/02/2016   1:19 PM To: Merlene Laughter, LPN  Results reviewed. Overall reassuring study. No evidence of pseudoaneurysm which was the main concern. A copy of this test should be forwarded to Rory Percy, MD.

## 2016-11-12 NOTE — Telephone Encounter (Signed)
Pt returned call, made aware of results and that letter had already been mailed out

## 2016-11-22 DIAGNOSIS — Z08 Encounter for follow-up examination after completed treatment for malignant neoplasm: Secondary | ICD-10-CM | POA: Diagnosis not present

## 2016-11-22 DIAGNOSIS — L821 Other seborrheic keratosis: Secondary | ICD-10-CM | POA: Diagnosis not present

## 2016-11-22 DIAGNOSIS — L570 Actinic keratosis: Secondary | ICD-10-CM | POA: Diagnosis not present

## 2016-11-22 DIAGNOSIS — D229 Melanocytic nevi, unspecified: Secondary | ICD-10-CM | POA: Diagnosis not present

## 2016-11-22 DIAGNOSIS — Z85828 Personal history of other malignant neoplasm of skin: Secondary | ICD-10-CM | POA: Diagnosis not present

## 2016-11-22 DIAGNOSIS — D485 Neoplasm of uncertain behavior of skin: Secondary | ICD-10-CM | POA: Diagnosis not present

## 2016-11-22 DIAGNOSIS — C44319 Basal cell carcinoma of skin of other parts of face: Secondary | ICD-10-CM | POA: Diagnosis not present

## 2016-11-24 DIAGNOSIS — Z880 Allergy status to penicillin: Secondary | ICD-10-CM | POA: Diagnosis not present

## 2016-11-24 DIAGNOSIS — M1388 Other specified arthritis, other site: Secondary | ICD-10-CM | POA: Diagnosis not present

## 2016-11-24 DIAGNOSIS — M13841 Other specified arthritis, right hand: Secondary | ICD-10-CM | POA: Diagnosis not present

## 2016-11-24 DIAGNOSIS — Z955 Presence of coronary angioplasty implant and graft: Secondary | ICD-10-CM | POA: Diagnosis not present

## 2016-11-24 DIAGNOSIS — Z7902 Long term (current) use of antithrombotics/antiplatelets: Secondary | ICD-10-CM | POA: Diagnosis not present

## 2016-11-24 DIAGNOSIS — I2510 Atherosclerotic heart disease of native coronary artery without angina pectoris: Secondary | ICD-10-CM | POA: Diagnosis not present

## 2016-11-24 DIAGNOSIS — M13842 Other specified arthritis, left hand: Secondary | ICD-10-CM | POA: Diagnosis not present

## 2016-11-24 DIAGNOSIS — I252 Old myocardial infarction: Secondary | ICD-10-CM | POA: Diagnosis not present

## 2016-11-24 DIAGNOSIS — Z91041 Radiographic dye allergy status: Secondary | ICD-10-CM | POA: Diagnosis not present

## 2016-11-24 DIAGNOSIS — Z7982 Long term (current) use of aspirin: Secondary | ICD-10-CM | POA: Diagnosis not present

## 2016-11-24 DIAGNOSIS — Z79899 Other long term (current) drug therapy: Secondary | ICD-10-CM | POA: Diagnosis not present

## 2016-11-24 DIAGNOSIS — I10 Essential (primary) hypertension: Secondary | ICD-10-CM | POA: Diagnosis not present

## 2016-11-24 DIAGNOSIS — E119 Type 2 diabetes mellitus without complications: Secondary | ICD-10-CM | POA: Diagnosis not present

## 2016-11-24 DIAGNOSIS — E785 Hyperlipidemia, unspecified: Secondary | ICD-10-CM | POA: Diagnosis not present

## 2016-11-24 DIAGNOSIS — Z87891 Personal history of nicotine dependence: Secondary | ICD-10-CM | POA: Diagnosis not present

## 2016-11-24 DIAGNOSIS — Z8249 Family history of ischemic heart disease and other diseases of the circulatory system: Secondary | ICD-10-CM | POA: Diagnosis not present

## 2016-11-26 DIAGNOSIS — Z87891 Personal history of nicotine dependence: Secondary | ICD-10-CM | POA: Diagnosis not present

## 2016-11-26 DIAGNOSIS — Z91041 Radiographic dye allergy status: Secondary | ICD-10-CM | POA: Diagnosis not present

## 2016-11-26 DIAGNOSIS — N400 Enlarged prostate without lower urinary tract symptoms: Secondary | ICD-10-CM | POA: Diagnosis not present

## 2016-11-26 DIAGNOSIS — E785 Hyperlipidemia, unspecified: Secondary | ICD-10-CM | POA: Diagnosis not present

## 2016-11-26 DIAGNOSIS — E119 Type 2 diabetes mellitus without complications: Secondary | ICD-10-CM | POA: Diagnosis not present

## 2016-11-26 DIAGNOSIS — M13842 Other specified arthritis, left hand: Secondary | ICD-10-CM | POA: Diagnosis not present

## 2016-11-26 DIAGNOSIS — I10 Essential (primary) hypertension: Secondary | ICD-10-CM | POA: Diagnosis not present

## 2016-11-26 DIAGNOSIS — R5383 Other fatigue: Secondary | ICD-10-CM | POA: Diagnosis not present

## 2016-11-26 DIAGNOSIS — I252 Old myocardial infarction: Secondary | ICD-10-CM | POA: Diagnosis not present

## 2016-11-26 DIAGNOSIS — Z8249 Family history of ischemic heart disease and other diseases of the circulatory system: Secondary | ICD-10-CM | POA: Diagnosis not present

## 2016-11-26 DIAGNOSIS — I2510 Atherosclerotic heart disease of native coronary artery without angina pectoris: Secondary | ICD-10-CM | POA: Diagnosis not present

## 2016-11-26 DIAGNOSIS — Z85828 Personal history of other malignant neoplasm of skin: Secondary | ICD-10-CM | POA: Diagnosis not present

## 2016-11-26 DIAGNOSIS — M13841 Other specified arthritis, right hand: Secondary | ICD-10-CM | POA: Diagnosis not present

## 2016-11-26 DIAGNOSIS — Z7902 Long term (current) use of antithrombotics/antiplatelets: Secondary | ICD-10-CM | POA: Diagnosis not present

## 2016-11-26 DIAGNOSIS — Z79899 Other long term (current) drug therapy: Secondary | ICD-10-CM | POA: Diagnosis not present

## 2016-11-26 DIAGNOSIS — M1388 Other specified arthritis, other site: Secondary | ICD-10-CM | POA: Diagnosis not present

## 2016-11-26 DIAGNOSIS — Z7982 Long term (current) use of aspirin: Secondary | ICD-10-CM | POA: Diagnosis not present

## 2016-11-26 DIAGNOSIS — Z880 Allergy status to penicillin: Secondary | ICD-10-CM | POA: Diagnosis not present

## 2016-11-26 DIAGNOSIS — Z955 Presence of coronary angioplasty implant and graft: Secondary | ICD-10-CM | POA: Diagnosis not present

## 2016-11-29 DIAGNOSIS — E785 Hyperlipidemia, unspecified: Secondary | ICD-10-CM | POA: Diagnosis not present

## 2016-11-29 DIAGNOSIS — Z955 Presence of coronary angioplasty implant and graft: Secondary | ICD-10-CM | POA: Diagnosis not present

## 2016-11-29 DIAGNOSIS — I10 Essential (primary) hypertension: Secondary | ICD-10-CM | POA: Diagnosis not present

## 2016-11-29 DIAGNOSIS — I252 Old myocardial infarction: Secondary | ICD-10-CM | POA: Diagnosis not present

## 2016-11-29 DIAGNOSIS — E119 Type 2 diabetes mellitus without complications: Secondary | ICD-10-CM | POA: Diagnosis not present

## 2016-11-29 DIAGNOSIS — I2510 Atherosclerotic heart disease of native coronary artery without angina pectoris: Secondary | ICD-10-CM | POA: Diagnosis not present

## 2016-12-01 DIAGNOSIS — I10 Essential (primary) hypertension: Secondary | ICD-10-CM | POA: Diagnosis not present

## 2016-12-01 DIAGNOSIS — I252 Old myocardial infarction: Secondary | ICD-10-CM | POA: Diagnosis not present

## 2016-12-01 DIAGNOSIS — E785 Hyperlipidemia, unspecified: Secondary | ICD-10-CM | POA: Diagnosis not present

## 2016-12-01 DIAGNOSIS — I2510 Atherosclerotic heart disease of native coronary artery without angina pectoris: Secondary | ICD-10-CM | POA: Diagnosis not present

## 2016-12-01 DIAGNOSIS — Z955 Presence of coronary angioplasty implant and graft: Secondary | ICD-10-CM | POA: Diagnosis not present

## 2016-12-01 DIAGNOSIS — E119 Type 2 diabetes mellitus without complications: Secondary | ICD-10-CM | POA: Diagnosis not present

## 2016-12-03 DIAGNOSIS — Z955 Presence of coronary angioplasty implant and graft: Secondary | ICD-10-CM | POA: Diagnosis not present

## 2016-12-03 DIAGNOSIS — I2510 Atherosclerotic heart disease of native coronary artery without angina pectoris: Secondary | ICD-10-CM | POA: Diagnosis not present

## 2016-12-03 DIAGNOSIS — E785 Hyperlipidemia, unspecified: Secondary | ICD-10-CM | POA: Diagnosis not present

## 2016-12-03 DIAGNOSIS — E119 Type 2 diabetes mellitus without complications: Secondary | ICD-10-CM | POA: Diagnosis not present

## 2016-12-03 DIAGNOSIS — I252 Old myocardial infarction: Secondary | ICD-10-CM | POA: Diagnosis not present

## 2016-12-03 DIAGNOSIS — I10 Essential (primary) hypertension: Secondary | ICD-10-CM | POA: Diagnosis not present

## 2016-12-06 DIAGNOSIS — I10 Essential (primary) hypertension: Secondary | ICD-10-CM | POA: Diagnosis not present

## 2016-12-06 DIAGNOSIS — I252 Old myocardial infarction: Secondary | ICD-10-CM | POA: Diagnosis not present

## 2016-12-06 DIAGNOSIS — E119 Type 2 diabetes mellitus without complications: Secondary | ICD-10-CM | POA: Diagnosis not present

## 2016-12-06 DIAGNOSIS — E785 Hyperlipidemia, unspecified: Secondary | ICD-10-CM | POA: Diagnosis not present

## 2016-12-06 DIAGNOSIS — Z6832 Body mass index (BMI) 32.0-32.9, adult: Secondary | ICD-10-CM | POA: Diagnosis not present

## 2016-12-06 DIAGNOSIS — Z955 Presence of coronary angioplasty implant and graft: Secondary | ICD-10-CM | POA: Diagnosis not present

## 2016-12-06 DIAGNOSIS — Z0000 Encounter for general adult medical examination without abnormal findings: Secondary | ICD-10-CM | POA: Diagnosis not present

## 2016-12-06 DIAGNOSIS — I2510 Atherosclerotic heart disease of native coronary artery without angina pectoris: Secondary | ICD-10-CM | POA: Diagnosis not present

## 2016-12-07 DIAGNOSIS — C44319 Basal cell carcinoma of skin of other parts of face: Secondary | ICD-10-CM | POA: Diagnosis not present

## 2016-12-10 DIAGNOSIS — I252 Old myocardial infarction: Secondary | ICD-10-CM | POA: Diagnosis not present

## 2016-12-10 DIAGNOSIS — Z955 Presence of coronary angioplasty implant and graft: Secondary | ICD-10-CM | POA: Diagnosis not present

## 2016-12-10 DIAGNOSIS — E119 Type 2 diabetes mellitus without complications: Secondary | ICD-10-CM | POA: Diagnosis not present

## 2016-12-10 DIAGNOSIS — I2510 Atherosclerotic heart disease of native coronary artery without angina pectoris: Secondary | ICD-10-CM | POA: Diagnosis not present

## 2016-12-10 DIAGNOSIS — E785 Hyperlipidemia, unspecified: Secondary | ICD-10-CM | POA: Diagnosis not present

## 2016-12-10 DIAGNOSIS — I10 Essential (primary) hypertension: Secondary | ICD-10-CM | POA: Diagnosis not present

## 2016-12-13 DIAGNOSIS — I2510 Atherosclerotic heart disease of native coronary artery without angina pectoris: Secondary | ICD-10-CM | POA: Diagnosis not present

## 2016-12-13 DIAGNOSIS — I252 Old myocardial infarction: Secondary | ICD-10-CM | POA: Diagnosis not present

## 2016-12-13 DIAGNOSIS — E785 Hyperlipidemia, unspecified: Secondary | ICD-10-CM | POA: Diagnosis not present

## 2016-12-13 DIAGNOSIS — Z955 Presence of coronary angioplasty implant and graft: Secondary | ICD-10-CM | POA: Diagnosis not present

## 2016-12-13 DIAGNOSIS — I10 Essential (primary) hypertension: Secondary | ICD-10-CM | POA: Diagnosis not present

## 2016-12-13 DIAGNOSIS — E119 Type 2 diabetes mellitus without complications: Secondary | ICD-10-CM | POA: Diagnosis not present

## 2016-12-14 DIAGNOSIS — E119 Type 2 diabetes mellitus without complications: Secondary | ICD-10-CM | POA: Diagnosis not present

## 2016-12-14 DIAGNOSIS — I252 Old myocardial infarction: Secondary | ICD-10-CM | POA: Diagnosis not present

## 2016-12-14 DIAGNOSIS — I2510 Atherosclerotic heart disease of native coronary artery without angina pectoris: Secondary | ICD-10-CM | POA: Diagnosis not present

## 2016-12-14 DIAGNOSIS — I10 Essential (primary) hypertension: Secondary | ICD-10-CM | POA: Diagnosis not present

## 2016-12-14 DIAGNOSIS — E785 Hyperlipidemia, unspecified: Secondary | ICD-10-CM | POA: Diagnosis not present

## 2016-12-14 DIAGNOSIS — Z955 Presence of coronary angioplasty implant and graft: Secondary | ICD-10-CM | POA: Diagnosis not present

## 2016-12-15 DIAGNOSIS — E119 Type 2 diabetes mellitus without complications: Secondary | ICD-10-CM | POA: Diagnosis not present

## 2016-12-15 DIAGNOSIS — I252 Old myocardial infarction: Secondary | ICD-10-CM | POA: Diagnosis not present

## 2016-12-15 DIAGNOSIS — Z955 Presence of coronary angioplasty implant and graft: Secondary | ICD-10-CM | POA: Diagnosis not present

## 2016-12-15 DIAGNOSIS — I10 Essential (primary) hypertension: Secondary | ICD-10-CM | POA: Diagnosis not present

## 2016-12-15 DIAGNOSIS — E785 Hyperlipidemia, unspecified: Secondary | ICD-10-CM | POA: Diagnosis not present

## 2016-12-15 DIAGNOSIS — I2510 Atherosclerotic heart disease of native coronary artery without angina pectoris: Secondary | ICD-10-CM | POA: Diagnosis not present

## 2016-12-20 DIAGNOSIS — E119 Type 2 diabetes mellitus without complications: Secondary | ICD-10-CM | POA: Diagnosis not present

## 2016-12-20 DIAGNOSIS — I10 Essential (primary) hypertension: Secondary | ICD-10-CM | POA: Diagnosis not present

## 2016-12-20 DIAGNOSIS — Z955 Presence of coronary angioplasty implant and graft: Secondary | ICD-10-CM | POA: Diagnosis not present

## 2016-12-20 DIAGNOSIS — I252 Old myocardial infarction: Secondary | ICD-10-CM | POA: Diagnosis not present

## 2016-12-20 DIAGNOSIS — I2510 Atherosclerotic heart disease of native coronary artery without angina pectoris: Secondary | ICD-10-CM | POA: Diagnosis not present

## 2016-12-20 DIAGNOSIS — E785 Hyperlipidemia, unspecified: Secondary | ICD-10-CM | POA: Diagnosis not present

## 2016-12-22 DIAGNOSIS — I252 Old myocardial infarction: Secondary | ICD-10-CM | POA: Diagnosis not present

## 2016-12-22 DIAGNOSIS — E785 Hyperlipidemia, unspecified: Secondary | ICD-10-CM | POA: Diagnosis not present

## 2016-12-22 DIAGNOSIS — I2510 Atherosclerotic heart disease of native coronary artery without angina pectoris: Secondary | ICD-10-CM | POA: Diagnosis not present

## 2016-12-22 DIAGNOSIS — Z955 Presence of coronary angioplasty implant and graft: Secondary | ICD-10-CM | POA: Diagnosis not present

## 2016-12-22 DIAGNOSIS — E119 Type 2 diabetes mellitus without complications: Secondary | ICD-10-CM | POA: Diagnosis not present

## 2016-12-22 DIAGNOSIS — I10 Essential (primary) hypertension: Secondary | ICD-10-CM | POA: Diagnosis not present

## 2016-12-24 DIAGNOSIS — I2510 Atherosclerotic heart disease of native coronary artery without angina pectoris: Secondary | ICD-10-CM | POA: Diagnosis not present

## 2016-12-24 DIAGNOSIS — I10 Essential (primary) hypertension: Secondary | ICD-10-CM | POA: Diagnosis not present

## 2016-12-24 DIAGNOSIS — E785 Hyperlipidemia, unspecified: Secondary | ICD-10-CM | POA: Diagnosis not present

## 2016-12-24 DIAGNOSIS — Z955 Presence of coronary angioplasty implant and graft: Secondary | ICD-10-CM | POA: Diagnosis not present

## 2016-12-24 DIAGNOSIS — I252 Old myocardial infarction: Secondary | ICD-10-CM | POA: Diagnosis not present

## 2016-12-24 DIAGNOSIS — E119 Type 2 diabetes mellitus without complications: Secondary | ICD-10-CM | POA: Diagnosis not present

## 2016-12-27 DIAGNOSIS — M13841 Other specified arthritis, right hand: Secondary | ICD-10-CM | POA: Diagnosis not present

## 2016-12-27 DIAGNOSIS — I252 Old myocardial infarction: Secondary | ICD-10-CM | POA: Diagnosis not present

## 2016-12-27 DIAGNOSIS — Z7982 Long term (current) use of aspirin: Secondary | ICD-10-CM | POA: Diagnosis not present

## 2016-12-27 DIAGNOSIS — Z955 Presence of coronary angioplasty implant and graft: Secondary | ICD-10-CM | POA: Diagnosis not present

## 2016-12-27 DIAGNOSIS — I2510 Atherosclerotic heart disease of native coronary artery without angina pectoris: Secondary | ICD-10-CM | POA: Diagnosis not present

## 2016-12-27 DIAGNOSIS — M13842 Other specified arthritis, left hand: Secondary | ICD-10-CM | POA: Diagnosis not present

## 2016-12-27 DIAGNOSIS — Z880 Allergy status to penicillin: Secondary | ICD-10-CM | POA: Diagnosis not present

## 2016-12-27 DIAGNOSIS — R5383 Other fatigue: Secondary | ICD-10-CM | POA: Diagnosis not present

## 2016-12-27 DIAGNOSIS — Z7902 Long term (current) use of antithrombotics/antiplatelets: Secondary | ICD-10-CM | POA: Diagnosis not present

## 2016-12-27 DIAGNOSIS — Z8249 Family history of ischemic heart disease and other diseases of the circulatory system: Secondary | ICD-10-CM | POA: Diagnosis not present

## 2016-12-27 DIAGNOSIS — I10 Essential (primary) hypertension: Secondary | ICD-10-CM | POA: Diagnosis not present

## 2016-12-27 DIAGNOSIS — Z79899 Other long term (current) drug therapy: Secondary | ICD-10-CM | POA: Diagnosis not present

## 2016-12-27 DIAGNOSIS — M1388 Other specified arthritis, other site: Secondary | ICD-10-CM | POA: Diagnosis not present

## 2016-12-27 DIAGNOSIS — E785 Hyperlipidemia, unspecified: Secondary | ICD-10-CM | POA: Diagnosis not present

## 2016-12-27 DIAGNOSIS — Z85828 Personal history of other malignant neoplasm of skin: Secondary | ICD-10-CM | POA: Diagnosis not present

## 2016-12-27 DIAGNOSIS — E119 Type 2 diabetes mellitus without complications: Secondary | ICD-10-CM | POA: Diagnosis not present

## 2016-12-27 DIAGNOSIS — Z87891 Personal history of nicotine dependence: Secondary | ICD-10-CM | POA: Diagnosis not present

## 2016-12-27 DIAGNOSIS — N400 Enlarged prostate without lower urinary tract symptoms: Secondary | ICD-10-CM | POA: Diagnosis not present

## 2016-12-27 DIAGNOSIS — Z91041 Radiographic dye allergy status: Secondary | ICD-10-CM | POA: Diagnosis not present

## 2016-12-29 DIAGNOSIS — I252 Old myocardial infarction: Secondary | ICD-10-CM | POA: Diagnosis not present

## 2016-12-29 DIAGNOSIS — E119 Type 2 diabetes mellitus without complications: Secondary | ICD-10-CM | POA: Diagnosis not present

## 2016-12-29 DIAGNOSIS — I10 Essential (primary) hypertension: Secondary | ICD-10-CM | POA: Diagnosis not present

## 2016-12-29 DIAGNOSIS — E785 Hyperlipidemia, unspecified: Secondary | ICD-10-CM | POA: Diagnosis not present

## 2016-12-29 DIAGNOSIS — I2510 Atherosclerotic heart disease of native coronary artery without angina pectoris: Secondary | ICD-10-CM | POA: Diagnosis not present

## 2016-12-29 DIAGNOSIS — Z955 Presence of coronary angioplasty implant and graft: Secondary | ICD-10-CM | POA: Diagnosis not present

## 2016-12-31 DIAGNOSIS — I252 Old myocardial infarction: Secondary | ICD-10-CM | POA: Diagnosis not present

## 2016-12-31 DIAGNOSIS — E785 Hyperlipidemia, unspecified: Secondary | ICD-10-CM | POA: Diagnosis not present

## 2016-12-31 DIAGNOSIS — I2510 Atherosclerotic heart disease of native coronary artery without angina pectoris: Secondary | ICD-10-CM | POA: Diagnosis not present

## 2016-12-31 DIAGNOSIS — I10 Essential (primary) hypertension: Secondary | ICD-10-CM | POA: Diagnosis not present

## 2016-12-31 DIAGNOSIS — E119 Type 2 diabetes mellitus without complications: Secondary | ICD-10-CM | POA: Diagnosis not present

## 2016-12-31 DIAGNOSIS — Z955 Presence of coronary angioplasty implant and graft: Secondary | ICD-10-CM | POA: Diagnosis not present

## 2017-01-05 DIAGNOSIS — E119 Type 2 diabetes mellitus without complications: Secondary | ICD-10-CM | POA: Diagnosis not present

## 2017-01-05 DIAGNOSIS — Z955 Presence of coronary angioplasty implant and graft: Secondary | ICD-10-CM | POA: Diagnosis not present

## 2017-01-05 DIAGNOSIS — I2510 Atherosclerotic heart disease of native coronary artery without angina pectoris: Secondary | ICD-10-CM | POA: Diagnosis not present

## 2017-01-05 DIAGNOSIS — I10 Essential (primary) hypertension: Secondary | ICD-10-CM | POA: Diagnosis not present

## 2017-01-05 DIAGNOSIS — I252 Old myocardial infarction: Secondary | ICD-10-CM | POA: Diagnosis not present

## 2017-01-05 DIAGNOSIS — E785 Hyperlipidemia, unspecified: Secondary | ICD-10-CM | POA: Diagnosis not present

## 2017-01-06 DIAGNOSIS — I10 Essential (primary) hypertension: Secondary | ICD-10-CM | POA: Diagnosis not present

## 2017-01-06 DIAGNOSIS — Z955 Presence of coronary angioplasty implant and graft: Secondary | ICD-10-CM | POA: Diagnosis not present

## 2017-01-06 DIAGNOSIS — E119 Type 2 diabetes mellitus without complications: Secondary | ICD-10-CM | POA: Diagnosis not present

## 2017-01-06 DIAGNOSIS — I2510 Atherosclerotic heart disease of native coronary artery without angina pectoris: Secondary | ICD-10-CM | POA: Diagnosis not present

## 2017-01-06 DIAGNOSIS — I252 Old myocardial infarction: Secondary | ICD-10-CM | POA: Diagnosis not present

## 2017-01-06 DIAGNOSIS — E785 Hyperlipidemia, unspecified: Secondary | ICD-10-CM | POA: Diagnosis not present

## 2017-01-10 DIAGNOSIS — Z955 Presence of coronary angioplasty implant and graft: Secondary | ICD-10-CM | POA: Diagnosis not present

## 2017-01-10 DIAGNOSIS — E785 Hyperlipidemia, unspecified: Secondary | ICD-10-CM | POA: Diagnosis not present

## 2017-01-10 DIAGNOSIS — I2510 Atherosclerotic heart disease of native coronary artery without angina pectoris: Secondary | ICD-10-CM | POA: Diagnosis not present

## 2017-01-10 DIAGNOSIS — I10 Essential (primary) hypertension: Secondary | ICD-10-CM | POA: Diagnosis not present

## 2017-01-10 DIAGNOSIS — I252 Old myocardial infarction: Secondary | ICD-10-CM | POA: Diagnosis not present

## 2017-01-10 DIAGNOSIS — E119 Type 2 diabetes mellitus without complications: Secondary | ICD-10-CM | POA: Diagnosis not present

## 2017-01-12 DIAGNOSIS — I2510 Atherosclerotic heart disease of native coronary artery without angina pectoris: Secondary | ICD-10-CM | POA: Diagnosis not present

## 2017-01-12 DIAGNOSIS — Z955 Presence of coronary angioplasty implant and graft: Secondary | ICD-10-CM | POA: Diagnosis not present

## 2017-01-12 DIAGNOSIS — I10 Essential (primary) hypertension: Secondary | ICD-10-CM | POA: Diagnosis not present

## 2017-01-12 DIAGNOSIS — E785 Hyperlipidemia, unspecified: Secondary | ICD-10-CM | POA: Diagnosis not present

## 2017-01-12 DIAGNOSIS — I252 Old myocardial infarction: Secondary | ICD-10-CM | POA: Diagnosis not present

## 2017-01-12 DIAGNOSIS — E119 Type 2 diabetes mellitus without complications: Secondary | ICD-10-CM | POA: Diagnosis not present

## 2017-01-14 DIAGNOSIS — I10 Essential (primary) hypertension: Secondary | ICD-10-CM | POA: Diagnosis not present

## 2017-01-14 DIAGNOSIS — I252 Old myocardial infarction: Secondary | ICD-10-CM | POA: Diagnosis not present

## 2017-01-14 DIAGNOSIS — E119 Type 2 diabetes mellitus without complications: Secondary | ICD-10-CM | POA: Diagnosis not present

## 2017-01-14 DIAGNOSIS — I2510 Atherosclerotic heart disease of native coronary artery without angina pectoris: Secondary | ICD-10-CM | POA: Diagnosis not present

## 2017-01-14 DIAGNOSIS — Z955 Presence of coronary angioplasty implant and graft: Secondary | ICD-10-CM | POA: Diagnosis not present

## 2017-01-14 DIAGNOSIS — E785 Hyperlipidemia, unspecified: Secondary | ICD-10-CM | POA: Diagnosis not present

## 2017-01-17 DIAGNOSIS — I2510 Atherosclerotic heart disease of native coronary artery without angina pectoris: Secondary | ICD-10-CM | POA: Diagnosis not present

## 2017-01-17 DIAGNOSIS — E119 Type 2 diabetes mellitus without complications: Secondary | ICD-10-CM | POA: Diagnosis not present

## 2017-01-17 DIAGNOSIS — I10 Essential (primary) hypertension: Secondary | ICD-10-CM | POA: Diagnosis not present

## 2017-01-17 DIAGNOSIS — Z955 Presence of coronary angioplasty implant and graft: Secondary | ICD-10-CM | POA: Diagnosis not present

## 2017-01-17 DIAGNOSIS — I252 Old myocardial infarction: Secondary | ICD-10-CM | POA: Diagnosis not present

## 2017-01-17 DIAGNOSIS — E785 Hyperlipidemia, unspecified: Secondary | ICD-10-CM | POA: Diagnosis not present

## 2017-01-20 DIAGNOSIS — Z955 Presence of coronary angioplasty implant and graft: Secondary | ICD-10-CM | POA: Diagnosis not present

## 2017-01-20 DIAGNOSIS — E119 Type 2 diabetes mellitus without complications: Secondary | ICD-10-CM | POA: Diagnosis not present

## 2017-01-20 DIAGNOSIS — I252 Old myocardial infarction: Secondary | ICD-10-CM | POA: Diagnosis not present

## 2017-01-20 DIAGNOSIS — I2510 Atherosclerotic heart disease of native coronary artery without angina pectoris: Secondary | ICD-10-CM | POA: Diagnosis not present

## 2017-01-20 DIAGNOSIS — I10 Essential (primary) hypertension: Secondary | ICD-10-CM | POA: Diagnosis not present

## 2017-01-20 DIAGNOSIS — E785 Hyperlipidemia, unspecified: Secondary | ICD-10-CM | POA: Diagnosis not present

## 2017-01-24 ENCOUNTER — Ambulatory Visit (INDEPENDENT_AMBULATORY_CARE_PROVIDER_SITE_OTHER): Payer: Medicare Other | Admitting: Cardiology

## 2017-01-24 ENCOUNTER — Encounter: Payer: Self-pay | Admitting: Cardiology

## 2017-01-24 VITALS — BP 129/71 | HR 79 | Ht 70.0 in | Wt 219.0 lb

## 2017-01-24 DIAGNOSIS — I252 Old myocardial infarction: Secondary | ICD-10-CM | POA: Diagnosis not present

## 2017-01-24 DIAGNOSIS — E785 Hyperlipidemia, unspecified: Secondary | ICD-10-CM | POA: Diagnosis not present

## 2017-01-24 DIAGNOSIS — I209 Angina pectoris, unspecified: Secondary | ICD-10-CM

## 2017-01-24 DIAGNOSIS — E782 Mixed hyperlipidemia: Secondary | ICD-10-CM

## 2017-01-24 DIAGNOSIS — E119 Type 2 diabetes mellitus without complications: Secondary | ICD-10-CM | POA: Diagnosis not present

## 2017-01-24 DIAGNOSIS — I25119 Atherosclerotic heart disease of native coronary artery with unspecified angina pectoris: Secondary | ICD-10-CM | POA: Diagnosis not present

## 2017-01-24 DIAGNOSIS — I2510 Atherosclerotic heart disease of native coronary artery without angina pectoris: Secondary | ICD-10-CM | POA: Diagnosis not present

## 2017-01-24 DIAGNOSIS — I10 Essential (primary) hypertension: Secondary | ICD-10-CM

## 2017-01-24 DIAGNOSIS — Z955 Presence of coronary angioplasty implant and graft: Secondary | ICD-10-CM | POA: Diagnosis not present

## 2017-01-24 DIAGNOSIS — I200 Unstable angina: Secondary | ICD-10-CM

## 2017-01-24 NOTE — Patient Instructions (Signed)

## 2017-01-24 NOTE — Progress Notes (Signed)
Cardiology Office Note  Date: 01/24/2017   ID: Sean Villarreal, DOB [birth year], MRN [id number]  PCP: Rory Percy, MD  Primary Cardiologist: Rozann Lesches, MD   Chief Complaint  Patient presents with  . Coronary Artery Disease    History of Present Illness: Sean Villarreal is a 76 y.o. male last seen in September. He presents today for a routine follow-up visit. Continues to enjoy cardiac rehabilitation, reports no angina symptoms or nitroglycerin use. He has had no palpitations or syncope.  We went over his cardiac medications which are outlined below. He continues on dual antiplatelet therapy. No reported bleeding problems.  Right groin site healed well after cardiac catheterization. Doppler study was negative for pseudoaneurysm or AV fistula.  Past Medical History:  Diagnosis Date  . Arthritis   . Basal cell carcinoma   . Coronary artery disease    DES to LAD and circumflex April 2014, PTCA ostial circumflex 03/2016 due to ISR, 9/18 PCI/DESx2 osital Lcx (ISR), p/mLCx   . Dyslipidemia   . Essential hypertension   . GERD (gastroesophageal reflux disease)   . Hematuria   . History of blood transfusion 09/2012  . History of kidney stones   . NSTEMI (non-ST elevated myocardial infarction) ([place]) 08/2012  . Type II diabetes mellitus ([place])     Past Surgical History:  Procedure Laterality Date  . BASAL CELL CARCINOMA EXCISION     "right cheek; both shoulders"  . CARDIAC CATHETERIZATION    . CATARACT EXTRACTION W/ INTRAOCULAR LENS  IMPLANT, BILATERAL Bilateral   . COLONOSCOPY N/A 03/19/2016   Procedure: COLONOSCOPY;  Surgeon: Rogene Houston, MD;  [facility];  Service: Endoscopy;  Laterality: N/A;  9:15  . CORONARY BALLOON ANGIOPLASTY N/A 04/15/2016   Procedure: Coronary Balloon Angioplasty;  Surgeon: Peter M Martinique, MD;  Location: [place] [facility];  Service: Cardiovascular;  Laterality: N/A;  . CORONARY STENT INTERVENTION N/A 10/11/2016   Procedure:  CORONARY STENT INTERVENTION;  Surgeon: Jettie Booze, MD;  Location: [place] [facility];  Service: Cardiovascular;  Laterality: N/A;  . CYSTOSCOPY W/ URETEROSCOPY W/ LITHOTRIPSY  10/2012   Archie Endo 11/10/2012  . CYSTOSCOPY WITH STENT PLACEMENT  08/2012; 09/2012  . EYE SURGERY Left ~ 02/2016   "for crinkled lens"  . LEFT HEART CATH AND CORONARY ANGIOGRAPHY N/A 04/15/2016   Procedure: Left Heart Cath and Coronary Angiography;  Surgeon: Peter M Martinique, MD;  Location: [place] [facility];  Service: Cardiovascular;  Laterality: N/A;  . LEFT HEART CATH AND CORONARY ANGIOGRAPHY N/A 10/11/2016   Procedure: LEFT HEART CATH AND CORONARY ANGIOGRAPHY;  Surgeon: Jettie Booze, MD;  Location: [place] [facility];  Service: Cardiovascular;  Laterality: N/A;  . LEFT HEART CATHETERIZATION WITH CORONARY ANGIOGRAM N/A 05/05/2012   Procedure: LEFT HEART CATHETERIZATION WITH CORONARY ANGIOGRAM;  Surgeon: Burnell Blanks, MD;  Location: [facility];  Service: Cardiovascular;  Laterality: N/A;  . LEFT HEART CATHETERIZATION WITH CORONARY ANGIOGRAM N/A 05/15/2012   Procedure: LEFT HEART CATHETERIZATION WITH CORONARY ANGIOGRAM;  Surgeon: Burnell Blanks, MD;  Location: [facility] C[facility] LAB;  Service: Cardiovascular;  Laterality: N/A;  . TONSILLECTOMY  1948    Current Outpatient Medications  Medication Sig Dispense Refill  . amLODipine (NORVASC) 10 MG tablet Take 1 tablet (10 mg total) by mouth daily. 30 tablet 3  . aspirin 81 MG tablet Take 81 mg by mouth daily.    [name] atorvastatin (LIPITOR) 80 MG tablet Take 80 mg by mouth at bedtime.    [name]  carvedilol (COREG) 25 MG tablet Take 1 tablet (25 mg total) by mouth 2 (two) times daily with a meal. 60 tablet 11  . clopidogrel (PLAVIX) 75 MG tablet TAKE 1 TABLET BY MOUTH  DAILY 90 tablet 3  . furosemide (LASIX) 20 MG tablet Take 1 tablet (20 mg total) by mouth every other day. 45 tablet 3  . glimepiride (AMARYL) 1 MG tablet Take 1 mg by mouth daily with breakfast.       . isosorbide mononitrate (IMDUR) 30 MG 24 hr tablet Take 1 tablet (30 mg total) by mouth daily. 30 tablet 11  . ketoconazole (NIZORAL) 2 % cream Apply 1 application topically 2 (two) times daily as needed (apply to face as needed (uses during the winter)). For dry skin    . lisinopril (PRINIVIL,ZESTRIL) 40 MG tablet Take 1 tablet (40 mg total) by mouth daily. 30 tablet 3  . metFORMIN (GLUCOPHAGE) 1000 MG tablet Take 1 tablet (1,000 mg total) by mouth 2 (two) times daily.    . nitroGLYCERIN (NITROSTAT) 0.4 MG SL tablet Place 1 tablet (0.4 mg total) under the tongue every 5 (five) minutes x 3 doses as needed for chest pain. 25 tablet 3  . potassium chloride SA (K-DUR,KLOR-CON) 20 MEQ tablet Take 1 tablet (20 mEq total) by mouth daily. 30 tablet 11  . psyllium (METAMUCIL SMOOTH TEXTURE) 28 % packet Take 1 packet by mouth at bedtime.    . ranitidine (ZANTAC) 150 MG tablet Take 1 tablet by mouth 2 (two) times daily as needed.  2  . tamsulosin (FLOMAX) 0.4 MG CAPS capsule Take 0.4 mg by mouth 2 (two) times daily.      No current facility-administered medications for this visit.    Allergies:  Penicillins and Contrast media [iodinated diagnostic agents]   Social History: The patient  reports that he quit smoking about 28 years ago. His smoking use included cigarettes. He started smoking about 35 years ago. He has a 22.50 pack-year smoking history. he has never used smokeless tobacco. He reports that he drinks alcohol. He reports that he does not use drugs.   ROS:  Please see the history of present illness. Otherwise, complete review of systems is positive for hearing loss.  All other systems are reviewed and negative.   Physical Exam: VS:  BP 129/71   Pulse 79   Ht 5\' 10"  (1.778 m)   Wt 219 lb (99.3 kg)   BMI 31.42 kg/m , BMI Body mass index is 31.42 kg/m.  Wt Readings from Last 3 Encounters:  01/24/17 219 lb (99.3 kg)  10/20/16 190 lb (86.2 kg)  10/12/16 206 lb 2.1 oz (93.5 kg)     General: Elderly male, appears comfortable at rest. HEENT: Conjunctiva and lids normal, oropharynx clear. Neck: Supple, no elevated JVP or carotid bruits, no thyromegaly. Lungs: Clear to auscultation, nonlabored breathing at rest. Cardiac: Regular rate and rhythm, no S3, soft systolic murmur, no pericardial rub. Abdomen: Soft, nontender, bowel sounds present, no guarding or rebound. Extremities: No pitting edema, distal pulses 2+. Skin: Warm and dry. Musculoskeletal: No kyphosis. Neuropsychiatric: Alert and oriented x3, affect grossly appropriate.  ECG: I personally reviewed the tracing from 10/12/2016 which showed sinus rhythm with nonspecific ST changes.  Recent Labwork: 10/09/2016: ALT 19; AST 20; B Natriuretic Peptide 126.4; TSH 2.786 10/10/2016: Magnesium 2.1 10/12/2016: BUN 11; Creatinine, Ser 0.82; Hemoglobin 15.1; Platelets 205; Potassium 4.0; Sodium 138     Component Value Date/Time   CHOL 99 10/10/2016 0215  TRIG 80 10/10/2016 0215   HDL 35 (L) 10/10/2016 0215   CHOLHDL 2.8 10/10/2016 0215   VLDL 16 10/10/2016 0215   LDLCALC 48 10/10/2016 0215    Other Studies Reviewed Today:  Cardiac catheterization and PCI 10/11/2016:  Prox RCA to Mid RCA lesion, 15 %stenosed.  Patent LAD stent.  Ost LAD to Prox LAD lesion, 10 %stenosed.  Mid LAD to Dist LAD lesion, 60 %stenosed, unchanged from prior.  Ost Ramus lesion, 100 %stenosed. There are left to left collaterals.  Prox Cx to Mid Cx lesion, 70 %stenosed. A STENT SYNERGY DES 2.5X12 drug eluting stent was successfully placed.  Post intervention, there is a 0% residual stenosis.  Ost Cx to Prox Cx lesion, 95 %stenosed. A STENT SYNERGY DES 3X16 drug eluting stent was successfully placed, postdilated to 3.5 mm.  Post intervention, there is a 0% residual stenosis.  The left ventricular systolic function is normal.  LV end diastolic pressure is normal.  The left ventricular ejection fraction is 55-65% by visual  estimate.  There is no aortic valve stenosis.  Continue dual antiplatelet therapy for at least a year, and perhaps beyond. Synergy stent was used given bioabsorbable polymer and the hopes this would decrease restenosis.   Continue aggressive secondary prevention.   Echocardiogram 10/10/2016: Study Conclusions  - Left ventricle: The cavity size was normal. Wall thickness was increased in a pattern of moderate LVH. Systolic function was normal. The estimated ejection fraction was in the range of 60% to 65%. Wall motion was normal; there were no regional wall motion abnormalities. - Aortic valve: Moderately to severely calcified annulus. Trileaflet; moderately thickened leaflets. 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 There was mild regurgitation. Mean gradient (S): 9 mm Hg. Valve area (VTI): 1.87 cm^2. Valve area (Vmax): 1.66 cm^2. - Mitral valve: Mildly to moderately calcified annulus. Mildly thickened leaflets .  Right groin ultrasound 10/28/2016: No evidence of pseudoaneurysm, AV fistula, or DVT right groin.  Assessment and Plan:  1. CAD status post DES 2 to the circumflex in the setting of in-stent restenosis back in September of this year. He is doing well without angina and remains on dual antiplatelet therapy. Continue cardiac rehabilitation.  2. Essential hypertension, blood pressure is adequately controlled today. No changes were made.  3. Mixed hyperlipidemia, continue Lipitor.  Current medicines were reviewed with the patient today.  Disposition: Follow-up in 6 months.  Signed, Satira Sark, MD, [facility] 01/24/2017 12:20 PM    [place] at [place], [place], [place] [address] Phone: [phone]; Fax: [phone]

## 2017-01-26 DIAGNOSIS — M1388 Other specified arthritis, other site: Secondary | ICD-10-CM | POA: Diagnosis not present

## 2017-01-26 DIAGNOSIS — Z880 Allergy status to penicillin: Secondary | ICD-10-CM | POA: Diagnosis not present

## 2017-01-26 DIAGNOSIS — Z7982 Long term (current) use of aspirin: Secondary | ICD-10-CM | POA: Diagnosis not present

## 2017-01-26 DIAGNOSIS — Z955 Presence of coronary angioplasty implant and graft: Secondary | ICD-10-CM | POA: Diagnosis not present

## 2017-01-26 DIAGNOSIS — R5383 Other fatigue: Secondary | ICD-10-CM | POA: Diagnosis not present

## 2017-01-26 DIAGNOSIS — M13842 Other specified arthritis, left hand: Secondary | ICD-10-CM | POA: Diagnosis not present

## 2017-01-26 DIAGNOSIS — Z79899 Other long term (current) drug therapy: Secondary | ICD-10-CM | POA: Diagnosis not present

## 2017-01-26 DIAGNOSIS — Z7902 Long term (current) use of antithrombotics/antiplatelets: Secondary | ICD-10-CM | POA: Diagnosis not present

## 2017-01-26 DIAGNOSIS — Z91041 Radiographic dye allergy status: Secondary | ICD-10-CM | POA: Diagnosis not present

## 2017-01-26 DIAGNOSIS — I10 Essential (primary) hypertension: Secondary | ICD-10-CM | POA: Diagnosis not present

## 2017-01-26 DIAGNOSIS — Z85828 Personal history of other malignant neoplasm of skin: Secondary | ICD-10-CM | POA: Diagnosis not present

## 2017-01-26 DIAGNOSIS — M13841 Other specified arthritis, right hand: Secondary | ICD-10-CM | POA: Diagnosis not present

## 2017-01-26 DIAGNOSIS — I2510 Atherosclerotic heart disease of native coronary artery without angina pectoris: Secondary | ICD-10-CM | POA: Diagnosis not present

## 2017-01-26 DIAGNOSIS — Z87891 Personal history of nicotine dependence: Secondary | ICD-10-CM | POA: Diagnosis not present

## 2017-01-26 DIAGNOSIS — Z8249 Family history of ischemic heart disease and other diseases of the circulatory system: Secondary | ICD-10-CM | POA: Diagnosis not present

## 2017-01-26 DIAGNOSIS — N400 Enlarged prostate without lower urinary tract symptoms: Secondary | ICD-10-CM | POA: Diagnosis not present

## 2017-01-26 DIAGNOSIS — I252 Old myocardial infarction: Secondary | ICD-10-CM | POA: Diagnosis not present

## 2017-01-26 DIAGNOSIS — E119 Type 2 diabetes mellitus without complications: Secondary | ICD-10-CM | POA: Diagnosis not present

## 2017-01-26 DIAGNOSIS — E785 Hyperlipidemia, unspecified: Secondary | ICD-10-CM | POA: Diagnosis not present

## 2017-01-28 DIAGNOSIS — E119 Type 2 diabetes mellitus without complications: Secondary | ICD-10-CM | POA: Diagnosis not present

## 2017-01-28 DIAGNOSIS — I10 Essential (primary) hypertension: Secondary | ICD-10-CM | POA: Diagnosis not present

## 2017-01-28 DIAGNOSIS — E785 Hyperlipidemia, unspecified: Secondary | ICD-10-CM | POA: Diagnosis not present

## 2017-01-28 DIAGNOSIS — Z955 Presence of coronary angioplasty implant and graft: Secondary | ICD-10-CM | POA: Diagnosis not present

## 2017-01-28 DIAGNOSIS — I252 Old myocardial infarction: Secondary | ICD-10-CM | POA: Diagnosis not present

## 2017-01-28 DIAGNOSIS — I2510 Atherosclerotic heart disease of native coronary artery without angina pectoris: Secondary | ICD-10-CM | POA: Diagnosis not present

## 2017-02-01 DIAGNOSIS — Z6832 Body mass index (BMI) 32.0-32.9, adult: Secondary | ICD-10-CM | POA: Diagnosis not present

## 2017-02-01 DIAGNOSIS — N509 Disorder of male genital organs, unspecified: Secondary | ICD-10-CM | POA: Diagnosis not present

## 2017-02-02 DIAGNOSIS — I2510 Atherosclerotic heart disease of native coronary artery without angina pectoris: Secondary | ICD-10-CM | POA: Diagnosis not present

## 2017-02-02 DIAGNOSIS — Z955 Presence of coronary angioplasty implant and graft: Secondary | ICD-10-CM | POA: Diagnosis not present

## 2017-02-02 DIAGNOSIS — E119 Type 2 diabetes mellitus without complications: Secondary | ICD-10-CM | POA: Diagnosis not present

## 2017-02-02 DIAGNOSIS — I10 Essential (primary) hypertension: Secondary | ICD-10-CM | POA: Diagnosis not present

## 2017-02-02 DIAGNOSIS — E785 Hyperlipidemia, unspecified: Secondary | ICD-10-CM | POA: Diagnosis not present

## 2017-02-02 DIAGNOSIS — I252 Old myocardial infarction: Secondary | ICD-10-CM | POA: Diagnosis not present

## 2017-02-04 DIAGNOSIS — E119 Type 2 diabetes mellitus without complications: Secondary | ICD-10-CM | POA: Diagnosis not present

## 2017-02-04 DIAGNOSIS — I2510 Atherosclerotic heart disease of native coronary artery without angina pectoris: Secondary | ICD-10-CM | POA: Diagnosis not present

## 2017-02-04 DIAGNOSIS — E785 Hyperlipidemia, unspecified: Secondary | ICD-10-CM | POA: Diagnosis not present

## 2017-02-04 DIAGNOSIS — Z955 Presence of coronary angioplasty implant and graft: Secondary | ICD-10-CM | POA: Diagnosis not present

## 2017-02-04 DIAGNOSIS — I10 Essential (primary) hypertension: Secondary | ICD-10-CM | POA: Diagnosis not present

## 2017-02-04 DIAGNOSIS — I252 Old myocardial infarction: Secondary | ICD-10-CM | POA: Diagnosis not present

## 2017-02-07 DIAGNOSIS — I2510 Atherosclerotic heart disease of native coronary artery without angina pectoris: Secondary | ICD-10-CM | POA: Diagnosis not present

## 2017-02-07 DIAGNOSIS — I252 Old myocardial infarction: Secondary | ICD-10-CM | POA: Diagnosis not present

## 2017-02-07 DIAGNOSIS — E785 Hyperlipidemia, unspecified: Secondary | ICD-10-CM | POA: Diagnosis not present

## 2017-02-07 DIAGNOSIS — E119 Type 2 diabetes mellitus without complications: Secondary | ICD-10-CM | POA: Diagnosis not present

## 2017-02-07 DIAGNOSIS — Z955 Presence of coronary angioplasty implant and graft: Secondary | ICD-10-CM | POA: Diagnosis not present

## 2017-02-07 DIAGNOSIS — I10 Essential (primary) hypertension: Secondary | ICD-10-CM | POA: Diagnosis not present

## 2017-02-08 DIAGNOSIS — N430 Encysted hydrocele: Secondary | ICD-10-CM | POA: Diagnosis not present

## 2017-02-09 DIAGNOSIS — E785 Hyperlipidemia, unspecified: Secondary | ICD-10-CM | POA: Diagnosis not present

## 2017-02-09 DIAGNOSIS — I10 Essential (primary) hypertension: Secondary | ICD-10-CM | POA: Diagnosis not present

## 2017-02-09 DIAGNOSIS — Z955 Presence of coronary angioplasty implant and graft: Secondary | ICD-10-CM | POA: Diagnosis not present

## 2017-02-09 DIAGNOSIS — E119 Type 2 diabetes mellitus without complications: Secondary | ICD-10-CM | POA: Diagnosis not present

## 2017-02-09 DIAGNOSIS — I2510 Atherosclerotic heart disease of native coronary artery without angina pectoris: Secondary | ICD-10-CM | POA: Diagnosis not present

## 2017-02-09 DIAGNOSIS — I252 Old myocardial infarction: Secondary | ICD-10-CM | POA: Diagnosis not present

## 2017-02-11 DIAGNOSIS — I2510 Atherosclerotic heart disease of native coronary artery without angina pectoris: Secondary | ICD-10-CM | POA: Diagnosis not present

## 2017-02-11 DIAGNOSIS — E119 Type 2 diabetes mellitus without complications: Secondary | ICD-10-CM | POA: Diagnosis not present

## 2017-02-11 DIAGNOSIS — I10 Essential (primary) hypertension: Secondary | ICD-10-CM | POA: Diagnosis not present

## 2017-02-11 DIAGNOSIS — Z955 Presence of coronary angioplasty implant and graft: Secondary | ICD-10-CM | POA: Diagnosis not present

## 2017-02-11 DIAGNOSIS — I252 Old myocardial infarction: Secondary | ICD-10-CM | POA: Diagnosis not present

## 2017-02-11 DIAGNOSIS — E785 Hyperlipidemia, unspecified: Secondary | ICD-10-CM | POA: Diagnosis not present

## 2017-02-14 DIAGNOSIS — Z955 Presence of coronary angioplasty implant and graft: Secondary | ICD-10-CM | POA: Diagnosis not present

## 2017-02-14 DIAGNOSIS — E785 Hyperlipidemia, unspecified: Secondary | ICD-10-CM | POA: Diagnosis not present

## 2017-02-14 DIAGNOSIS — I252 Old myocardial infarction: Secondary | ICD-10-CM | POA: Diagnosis not present

## 2017-02-14 DIAGNOSIS — I2510 Atherosclerotic heart disease of native coronary artery without angina pectoris: Secondary | ICD-10-CM | POA: Diagnosis not present

## 2017-02-14 DIAGNOSIS — E119 Type 2 diabetes mellitus without complications: Secondary | ICD-10-CM | POA: Diagnosis not present

## 2017-02-14 DIAGNOSIS — I10 Essential (primary) hypertension: Secondary | ICD-10-CM | POA: Diagnosis not present

## 2017-02-16 DIAGNOSIS — E119 Type 2 diabetes mellitus without complications: Secondary | ICD-10-CM | POA: Diagnosis not present

## 2017-02-16 DIAGNOSIS — E785 Hyperlipidemia, unspecified: Secondary | ICD-10-CM | POA: Diagnosis not present

## 2017-02-16 DIAGNOSIS — I10 Essential (primary) hypertension: Secondary | ICD-10-CM | POA: Diagnosis not present

## 2017-02-16 DIAGNOSIS — I252 Old myocardial infarction: Secondary | ICD-10-CM | POA: Diagnosis not present

## 2017-02-16 DIAGNOSIS — Z955 Presence of coronary angioplasty implant and graft: Secondary | ICD-10-CM | POA: Diagnosis not present

## 2017-02-16 DIAGNOSIS — I2510 Atherosclerotic heart disease of native coronary artery without angina pectoris: Secondary | ICD-10-CM | POA: Diagnosis not present

## 2017-02-18 DIAGNOSIS — I10 Essential (primary) hypertension: Secondary | ICD-10-CM | POA: Diagnosis not present

## 2017-02-18 DIAGNOSIS — I252 Old myocardial infarction: Secondary | ICD-10-CM | POA: Diagnosis not present

## 2017-02-18 DIAGNOSIS — I2510 Atherosclerotic heart disease of native coronary artery without angina pectoris: Secondary | ICD-10-CM | POA: Diagnosis not present

## 2017-02-18 DIAGNOSIS — E119 Type 2 diabetes mellitus without complications: Secondary | ICD-10-CM | POA: Diagnosis not present

## 2017-02-18 DIAGNOSIS — E785 Hyperlipidemia, unspecified: Secondary | ICD-10-CM | POA: Diagnosis not present

## 2017-02-18 DIAGNOSIS — Z955 Presence of coronary angioplasty implant and graft: Secondary | ICD-10-CM | POA: Diagnosis not present

## 2017-02-21 DIAGNOSIS — H353131 Nonexudative age-related macular degeneration, bilateral, early dry stage: Secondary | ICD-10-CM | POA: Diagnosis not present

## 2017-02-21 DIAGNOSIS — I2510 Atherosclerotic heart disease of native coronary artery without angina pectoris: Secondary | ICD-10-CM | POA: Diagnosis not present

## 2017-02-21 DIAGNOSIS — E785 Hyperlipidemia, unspecified: Secondary | ICD-10-CM | POA: Diagnosis not present

## 2017-02-21 DIAGNOSIS — H52209 Unspecified astigmatism, unspecified eye: Secondary | ICD-10-CM | POA: Diagnosis not present

## 2017-02-21 DIAGNOSIS — I252 Old myocardial infarction: Secondary | ICD-10-CM | POA: Diagnosis not present

## 2017-02-21 DIAGNOSIS — E119 Type 2 diabetes mellitus without complications: Secondary | ICD-10-CM | POA: Diagnosis not present

## 2017-02-21 DIAGNOSIS — I10 Essential (primary) hypertension: Secondary | ICD-10-CM | POA: Diagnosis not present

## 2017-02-21 DIAGNOSIS — Z955 Presence of coronary angioplasty implant and graft: Secondary | ICD-10-CM | POA: Diagnosis not present

## 2017-02-23 DIAGNOSIS — E785 Hyperlipidemia, unspecified: Secondary | ICD-10-CM | POA: Diagnosis not present

## 2017-02-23 DIAGNOSIS — Z955 Presence of coronary angioplasty implant and graft: Secondary | ICD-10-CM | POA: Diagnosis not present

## 2017-02-23 DIAGNOSIS — I2510 Atherosclerotic heart disease of native coronary artery without angina pectoris: Secondary | ICD-10-CM | POA: Diagnosis not present

## 2017-02-23 DIAGNOSIS — E119 Type 2 diabetes mellitus without complications: Secondary | ICD-10-CM | POA: Diagnosis not present

## 2017-02-23 DIAGNOSIS — I252 Old myocardial infarction: Secondary | ICD-10-CM | POA: Diagnosis not present

## 2017-02-23 DIAGNOSIS — I10 Essential (primary) hypertension: Secondary | ICD-10-CM | POA: Diagnosis not present

## 2017-04-06 DIAGNOSIS — I25119 Atherosclerotic heart disease of native coronary artery with unspecified angina pectoris: Secondary | ICD-10-CM | POA: Diagnosis not present

## 2017-04-06 DIAGNOSIS — I10 Essential (primary) hypertension: Secondary | ICD-10-CM | POA: Diagnosis not present

## 2017-04-06 DIAGNOSIS — I2510 Atherosclerotic heart disease of native coronary artery without angina pectoris: Secondary | ICD-10-CM | POA: Diagnosis not present

## 2017-04-06 DIAGNOSIS — Z6832 Body mass index (BMI) 32.0-32.9, adult: Secondary | ICD-10-CM | POA: Diagnosis not present

## 2017-04-06 DIAGNOSIS — E1165 Type 2 diabetes mellitus with hyperglycemia: Secondary | ICD-10-CM | POA: Diagnosis not present

## 2017-04-06 DIAGNOSIS — M160 Bilateral primary osteoarthritis of hip: Secondary | ICD-10-CM | POA: Diagnosis not present

## 2017-04-11 DIAGNOSIS — M1611 Unilateral primary osteoarthritis, right hip: Secondary | ICD-10-CM | POA: Diagnosis not present

## 2017-05-24 DIAGNOSIS — D229 Melanocytic nevi, unspecified: Secondary | ICD-10-CM | POA: Diagnosis not present

## 2017-05-24 DIAGNOSIS — L570 Actinic keratosis: Secondary | ICD-10-CM | POA: Diagnosis not present

## 2017-05-24 DIAGNOSIS — Z85828 Personal history of other malignant neoplasm of skin: Secondary | ICD-10-CM | POA: Diagnosis not present

## 2017-05-24 DIAGNOSIS — Z08 Encounter for follow-up examination after completed treatment for malignant neoplasm: Secondary | ICD-10-CM | POA: Diagnosis not present

## 2017-05-24 DIAGNOSIS — L821 Other seborrheic keratosis: Secondary | ICD-10-CM | POA: Diagnosis not present

## 2017-06-09 DIAGNOSIS — Z7902 Long term (current) use of antithrombotics/antiplatelets: Secondary | ICD-10-CM | POA: Diagnosis not present

## 2017-06-09 DIAGNOSIS — Z7984 Long term (current) use of oral hypoglycemic drugs: Secondary | ICD-10-CM | POA: Diagnosis not present

## 2017-06-09 DIAGNOSIS — Z87891 Personal history of nicotine dependence: Secondary | ICD-10-CM | POA: Diagnosis not present

## 2017-06-09 DIAGNOSIS — M25551 Pain in right hip: Secondary | ICD-10-CM | POA: Diagnosis not present

## 2017-06-09 DIAGNOSIS — M1611 Unilateral primary osteoarthritis, right hip: Secondary | ICD-10-CM | POA: Diagnosis not present

## 2017-06-09 DIAGNOSIS — Z7982 Long term (current) use of aspirin: Secondary | ICD-10-CM | POA: Diagnosis not present

## 2017-06-09 DIAGNOSIS — Z91041 Radiographic dye allergy status: Secondary | ICD-10-CM | POA: Diagnosis not present

## 2017-06-09 DIAGNOSIS — M25552 Pain in left hip: Secondary | ICD-10-CM | POA: Diagnosis not present

## 2017-06-09 DIAGNOSIS — Z96641 Presence of right artificial hip joint: Secondary | ICD-10-CM | POA: Diagnosis not present

## 2017-06-09 DIAGNOSIS — Z880 Allergy status to penicillin: Secondary | ICD-10-CM | POA: Diagnosis not present

## 2017-06-09 DIAGNOSIS — E119 Type 2 diabetes mellitus without complications: Secondary | ICD-10-CM | POA: Diagnosis not present

## 2017-06-09 DIAGNOSIS — R799 Abnormal finding of blood chemistry, unspecified: Secondary | ICD-10-CM | POA: Diagnosis not present

## 2017-06-14 DIAGNOSIS — M1611 Unilateral primary osteoarthritis, right hip: Secondary | ICD-10-CM | POA: Diagnosis not present

## 2017-06-14 DIAGNOSIS — R262 Difficulty in walking, not elsewhere classified: Secondary | ICD-10-CM | POA: Diagnosis not present

## 2017-06-14 DIAGNOSIS — M25551 Pain in right hip: Secondary | ICD-10-CM | POA: Diagnosis not present

## 2017-08-01 NOTE — Progress Notes (Signed)
Cardiology Office Note  Date: 08/02/2017   ID: Sean Villarreal, DOB Jan 28, 1940, MRN [id number]  PCP: Rory Percy, MD  Primary Cardiologist: Rozann Lesches, MD   Chief Complaint  Patient presents with  . Coronary Artery Disease    History of Present Illness: Sean Villarreal is a 77 y.o. male last seen in December 2018.  He is here for a routine follow-up visit.  Reports no significant angina symptoms or breathlessness beyond NYHA class II.  Remains functional with ADLs including yard work, but is significantly limited by right hip pain due to arthritis.  He is using a cane and anticipates having right hip replacement once he is able to stop dual antiplatelet therapy.  He has seen Dr. Rosanne [facility]ing at [facility].  Current cardiac medications are outlined below.  He is on aspirin, Plavix, Coreg, Lipitor, Norvasc, Lasix, lisinopril, potassium supplements, and as needed nitroglycerin.  He continues to follow with Dr. Nadara Mustard.  I personally reviewed his ECG today which shows sinus rhythm with decreased R wave progression and low voltage.  Past Medical History:  Diagnosis Date  . Arthritis   . Basal cell carcinoma   . Coronary artery disease    DES to LAD and circumflex April 2014, PTCA ostial circumflex 03/2016 due to ISR, 9/18 PCI/DESx2 osital Lcx (ISR), p/mLCx   . Dyslipidemia   . Essential hypertension   . GERD (gastroesophageal reflux disease)   . Hematuria   . History of blood transfusion 09/2012  . History of kidney stones   . NSTEMI (non-ST elevated myocardial infarction) ([place]) 08/2012  . Type II diabetes mellitus ([place])     Past Surgical History:  Procedure Laterality Date  . BASAL CELL CARCINOMA EXCISION     "right cheek; both shoulders"  . CARDIAC CATHETERIZATION    . CATARACT EXTRACTION W/ INTRAOCULAR LENS  IMPLANT, BILATERAL Bilateral   . COLONOSCOPY N/A 03/19/2016   Procedure: COLONOSCOPY;  Surgeon: Rogene Houston, MD;  [facility];  Service:  Endoscopy;  Laterality: N/A;  9:15  . CORONARY BALLOON ANGIOPLASTY N/A 04/15/2016   Procedure: Coronary Balloon Angioplasty;  Surgeon: Peter M Martinique, MD;  Location: [place] [facility] LAB;  Service: Cardiovascular;  Laterality: N/A;  . CORONARY STENT INTERVENTION N/A 10/11/2016   Procedure: CORONARY STENT INTERVENTION;  Surgeon: Jettie Booze, MD;  Location: [place] CV LAB;  Service: Cardiovascular;  Laterality: N/A;  . CYSTOSCOPY W/ URETEROSCOPY W/ LITHOTRIPSY  10/2012   Archie Endo 11/10/2012  . CYSTOSCOPY WITH STENT PLACEMENT  08/2012; 09/2012  . EYE SURGERY Left ~ 02/2016   "for crinkled lens"  . LEFT HEART CATH AND CORONARY ANGIOGRAPHY N/A 04/15/2016   Procedure: Left Heart Cath and Coronary Angiography;  Surgeon: Peter M Martinique, MD;  Location: [place] [facility] LAB;  Service: Cardiovascular;  Laterality: N/A;  . LEFT HEART CATH AND CORONARY ANGIOGRAPHY N/A 10/11/2016   Procedure: LEFT HEART CATH AND CORONARY ANGIOGRAPHY;  Surgeon: Jettie Booze, MD;  Location: [place] [facility] LAB;  Service: Cardiovascular;  Laterality: N/A;  . LEFT HEART CATHETERIZATION WITH CORONARY ANGIOGRAM N/A 05/05/2012   Procedure: LEFT HEART CATHETERIZATION WITH CORONARY ANGIOGRAM;  Surgeon: Burnell Blanks, MD;  Location: [facility] CATH LAB;  Service: Cardiovascular;  Laterality: N/A;  . LEFT HEART CATHETERIZATION WITH CORONARY ANGIOGRAM N/A 05/15/2012   Procedure: LEFT HEART CATHETERIZATION WITH CORONARY ANGIOGRAM;  Surgeon: Burnell Blanks, MD;  Location: [facility] CATH LAB;  Service: Cardiovascular;  Laterality: N/A;  . TONSILLECTOMY  1948  Current Outpatient Medications  Medication Sig Dispense Refill  . amLODipine (NORVASC) 10 MG tablet Take 1 tablet (10 mg total) by mouth daily. 30 tablet 3  . aspirin 81 MG tablet Take 81 mg by mouth daily.    [name] atorvastatin (LIPITOR) 80 MG tablet Take 80 mg by mouth at bedtime.    . carvedilol (COREG) 25 MG tablet Take 1 tablet (25 mg total) by mouth 2 (two) times daily  with a meal. 60 tablet 11  . clopidogrel (PLAVIX) 75 MG tablet TAKE 1 TABLET BY MOUTH  DAILY 90 tablet 3  . furosemide (LASIX) 20 MG tablet Take 1 tablet (20 mg total) by mouth every other day. 45 tablet 3  . glimepiride (AMARYL) 1 MG tablet Take 1 mg by mouth daily with breakfast.     . isosorbide mononitrate (IMDUR) 30 MG 24 hr tablet Take 1 tablet (30 mg total) by mouth daily. 30 tablet 11  . ketoconazole (NIZORAL) 2 % cream Apply 1 application topically 2 (two) times daily as needed (apply to face as needed (uses during the winter)). For dry skin    . lisinopril (PRINIVIL,ZESTRIL) 40 MG tablet Take 1 tablet (40 mg total) by mouth daily. 30 tablet 3  . metFORMIN (GLUCOPHAGE) 1000 MG tablet Take 1 tablet (1,000 mg total) by mouth 2 (two) times daily.    . nitroGLYCERIN (NITROSTAT) 0.4 MG SL tablet Place 1 tablet (0.4 mg total) under the tongue every 5 (five) minutes x 3 doses as needed for chest pain. 25 tablet 3  . potassium chloride SA (K-DUR,KLOR-CON) 20 MEQ tablet Take 1 tablet (20 mEq total) by mouth daily. 30 tablet 11  . psyllium (METAMUCIL SMOOTH TEXTURE) 28 % packet Take 1 packet by mouth at bedtime.    . ranitidine (ZANTAC) 150 MG tablet Take 1 tablet by mouth 2 (two) times daily as needed.  2  . tamsulosin (FLOMAX) 0.4 MG CAPS capsule Take 0.4 mg by mouth 2 (two) times daily.      No current facility-administered medications for this visit.    Allergies:  Penicillins and Contrast media [iodinated diagnostic agents]   Social History: The patient  reports that he quit smoking about 28 years ago. His smoking use included cigarettes. He started smoking about 35 years ago. He has a 22.50 pack-year smoking history. He has never used smokeless tobacco. He reports that he drinks alcohol. He reports that he does not use drugs.   ROS:  Please see the history of present illness. Otherwise, complete review of systems is positive for right hip pain.  All other systems are reviewed and negative.     Physical Exam: VS:  BP (!) 148/80   Pulse 82   Ht 5\' 10"  (1.778 m)   Wt 212 lb (96.2 kg)   SpO2 98%   BMI 30.42 kg/m , BMI Body mass index is 30.42 kg/m.  Wt Readings from Last 3 Encounters:  08/02/17 212 lb (96.2 kg)  01/24/17 219 lb (99.3 kg)  10/20/16 190 lb (86.2 kg)    General: Elderly male, appears comfortable at rest. HEENT: Conjunctiva and lids normal, oropharynx clear. Neck: Supple, no elevated JVP or carotid bruits, no thyromegaly. Lungs: Clear to auscultation, nonlabored breathing at rest. Cardiac: Regular rate and rhythm, no S3, soft systolic murmur. Abdomen: Soft, nontender, bowel sounds present. Extremities: No pitting edema, distal pulses 2+. Skin: Warm and dry. Musculoskeletal: No kyphosis. Neuropsychiatric: Alert and oriented x3, affect grossly appropriate.  ECG: I personally reviewed the tracing  from 10/12/2016 which showed sinus rhythm with nonspecific ST changes.  Recent Labwork: 10/09/2016: ALT 19; AST 20; B Natriuretic Peptide 126.4; TSH 2.786 10/10/2016: Magnesium 2.1 10/12/2016: BUN 11; Creatinine, Ser 0.82; Hemoglobin 15.1; Platelets 205; Potassium 4.0; Sodium 138     Component Value Date/Time   CHOL 99 10/10/2016 0215   TRIG 80 10/10/2016 0215   HDL 35 (L) 10/10/2016 0215   CHOLHDL 2.8 10/10/2016 0215   VLDL 16 10/10/2016 0215   LDLCALC 48 10/10/2016 0215    Other Studies Reviewed Today:  Cardiac catheterization and PCI 10/11/2016:  Prox RCA to Mid RCA lesion, 15 %stenosed.  Patent LAD stent.  Ost LAD to Prox LAD lesion, 10 %stenosed.  Mid LAD to Dist LAD lesion, 60 %stenosed, unchanged from prior.  Ost Ramus lesion, 100 %stenosed. There are left to left collaterals.  Prox Cx to Mid Cx lesion, 70 %stenosed. A STENT SYNERGY DES 2.5X12 drug eluting stent was successfully placed.  Post intervention, there is a 0% residual stenosis.  Ost Cx to Prox Cx lesion, 95 %stenosed. A STENT SYNERGY DES 3X16 drug eluting stent was successfully  placed, postdilated to 3.5 mm.  Post intervention, there is a 0% residual stenosis.  The left ventricular systolic function is normal.  LV end diastolic pressure is normal.  The left ventricular ejection fraction is 55-65% by visual estimate.  There is no aortic valve stenosis.  Continue dual antiplatelet therapy for at least a year, and perhaps beyond. Synergy stent was used given bioabsorbable polymer and the hopes this would decrease restenosis.   Continue aggressive secondary prevention.  Echocardiogram 10/10/2016: Study Conclusions  - Left ventricle: The cavity size was normal. Wall thickness was increased in a pattern of moderate LVH. Systolic function was normal. The estimated ejection fraction was in the range of 60% to 65%. Wall motion was normal; there were no regional wall motion abnormalities. - Aortic valve: Moderately to severely calcified annulus. Trileaflet; moderately thickened leaflets. 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 There was mild regurgitation. Mean gradient (S): 9 mm Hg. Valve area (VTI): 1.87 cm^2. Valve area (Vmax): 1.66 cm^2. - Mitral valve: Mildly to moderately calcified annulus. Mildly thickened leaflets .  Assessment and Plan:  1.  CAD status post DES x2 to the circumflex for treatment of in-stent restenosis in September 2018.  He is clinically stable without angina on medical therapy.  2.  Preoperative evaluation in anticipation of right total hip arthroplasty at [facility].  Surgery is being deferred until he can interrupt dual antiplatelet therapy which would be around mid September of this year.  Otherwise further ischemic testing is not planned in the absence of new symptoms.  He describes activities meeting 4 METS.  Based on RCRI risk calculator he is class II with 0.9% risk of major cardiac event perioperatively.  3.  Hyperlipidemia, tolerating Lipitor with good LDL control.  4.  Essential hypertension, no changes made to present  regimen.  Current medicines were reviewed with the patient today.   Orders Placed This Encounter  Procedures  . EKG 12-Lead    Disposition: Follow-up in 6 months.  Signed, Satira Sark, MD, [facility] 08/02/2017 9:39 AM    [facility] at [place], [place], [place] [address] Phone: [phone]; Fax: [phone]

## 2017-08-02 ENCOUNTER — Ambulatory Visit (INDEPENDENT_AMBULATORY_CARE_PROVIDER_SITE_OTHER): Payer: Medicare Other | Admitting: Cardiology

## 2017-08-02 ENCOUNTER — Encounter: Payer: Self-pay | Admitting: Cardiology

## 2017-08-02 VITALS — BP 148/80 | HR 82 | Ht 70.0 in | Wt 212.0 lb

## 2017-08-02 DIAGNOSIS — I25119 Atherosclerotic heart disease of native coronary artery with unspecified angina pectoris: Secondary | ICD-10-CM

## 2017-08-02 DIAGNOSIS — E782 Mixed hyperlipidemia: Secondary | ICD-10-CM | POA: Diagnosis not present

## 2017-08-02 DIAGNOSIS — I10 Essential (primary) hypertension: Secondary | ICD-10-CM | POA: Diagnosis not present

## 2017-08-02 DIAGNOSIS — Z01810 Encounter for preprocedural cardiovascular examination: Secondary | ICD-10-CM | POA: Diagnosis not present

## 2017-08-02 NOTE — Patient Instructions (Signed)

## 2017-09-12 ENCOUNTER — Telehealth: Payer: Self-pay | Admitting: Cardiology

## 2017-09-12 NOTE — Telephone Encounter (Signed)
Requested surgical clearance from [facility] and spoke with amanda. Stated they would fax form over for Dr. Domenic Polite to fill out

## 2017-09-12 NOTE — Telephone Encounter (Signed)
Patient called stating that he is going to have a hip replacement in September.  States that he spoke with Dr. Domenic Polite about coming off Plavix. His surgeon is Dr. Rosanne Ashing at [place] # [phone].

## 2017-10-06 DIAGNOSIS — I10 Essential (primary) hypertension: Secondary | ICD-10-CM | POA: Diagnosis not present

## 2017-10-06 DIAGNOSIS — Z23 Encounter for immunization: Secondary | ICD-10-CM | POA: Diagnosis not present

## 2017-10-06 DIAGNOSIS — Z6831 Body mass index (BMI) 31.0-31.9, adult: Secondary | ICD-10-CM | POA: Diagnosis not present

## 2017-10-06 DIAGNOSIS — E119 Type 2 diabetes mellitus without complications: Secondary | ICD-10-CM | POA: Diagnosis not present

## 2017-10-06 DIAGNOSIS — M160 Bilateral primary osteoarthritis of hip: Secondary | ICD-10-CM | POA: Diagnosis not present

## 2017-10-12 DIAGNOSIS — M1611 Unilateral primary osteoarthritis, right hip: Secondary | ICD-10-CM | POA: Diagnosis not present

## 2017-10-12 DIAGNOSIS — Z01818 Encounter for other preprocedural examination: Secondary | ICD-10-CM | POA: Diagnosis not present

## 2017-10-12 DIAGNOSIS — Z79899 Other long term (current) drug therapy: Secondary | ICD-10-CM | POA: Diagnosis not present

## 2017-10-12 DIAGNOSIS — Z87891 Personal history of nicotine dependence: Secondary | ICD-10-CM | POA: Diagnosis not present

## 2017-10-12 DIAGNOSIS — R799 Abnormal finding of blood chemistry, unspecified: Secondary | ICD-10-CM | POA: Diagnosis not present

## 2017-10-12 DIAGNOSIS — I10 Essential (primary) hypertension: Secondary | ICD-10-CM | POA: Diagnosis not present

## 2017-10-12 DIAGNOSIS — Z888 Allergy status to other drugs, medicaments and biological substances status: Secondary | ICD-10-CM | POA: Diagnosis not present

## 2017-10-18 DIAGNOSIS — M6281 Muscle weakness (generalized): Secondary | ICD-10-CM | POA: Diagnosis not present

## 2017-10-18 DIAGNOSIS — M1611 Unilateral primary osteoarthritis, right hip: Secondary | ICD-10-CM | POA: Diagnosis not present

## 2017-10-18 DIAGNOSIS — M25551 Pain in right hip: Secondary | ICD-10-CM | POA: Diagnosis not present

## 2017-10-25 DIAGNOSIS — M1611 Unilateral primary osteoarthritis, right hip: Secondary | ICD-10-CM | POA: Diagnosis present

## 2017-10-25 DIAGNOSIS — Z961 Presence of intraocular lens: Secondary | ICD-10-CM | POA: Diagnosis present

## 2017-10-25 DIAGNOSIS — Z87442 Personal history of urinary calculi: Secondary | ICD-10-CM | POA: Diagnosis not present

## 2017-10-25 DIAGNOSIS — Z9842 Cataract extraction status, left eye: Secondary | ICD-10-CM | POA: Diagnosis not present

## 2017-10-25 DIAGNOSIS — Z9841 Cataract extraction status, right eye: Secondary | ICD-10-CM | POA: Diagnosis not present

## 2017-10-25 DIAGNOSIS — K219 Gastro-esophageal reflux disease without esophagitis: Secondary | ICD-10-CM | POA: Diagnosis present

## 2017-10-25 DIAGNOSIS — Z8249 Family history of ischemic heart disease and other diseases of the circulatory system: Secondary | ICD-10-CM | POA: Diagnosis not present

## 2017-10-25 DIAGNOSIS — Z96641 Presence of right artificial hip joint: Secondary | ICD-10-CM | POA: Diagnosis not present

## 2017-10-25 DIAGNOSIS — M85851 Other specified disorders of bone density and structure, right thigh: Secondary | ICD-10-CM | POA: Diagnosis not present

## 2017-10-25 DIAGNOSIS — Z7902 Long term (current) use of antithrombotics/antiplatelets: Secondary | ICD-10-CM | POA: Diagnosis not present

## 2017-10-25 DIAGNOSIS — G8918 Other acute postprocedural pain: Secondary | ICD-10-CM | POA: Diagnosis not present

## 2017-10-25 DIAGNOSIS — E119 Type 2 diabetes mellitus without complications: Secondary | ICD-10-CM | POA: Diagnosis present

## 2017-10-25 DIAGNOSIS — Z79899 Other long term (current) drug therapy: Secondary | ICD-10-CM | POA: Diagnosis not present

## 2017-10-25 DIAGNOSIS — N400 Enlarged prostate without lower urinary tract symptoms: Secondary | ICD-10-CM | POA: Diagnosis present

## 2017-10-25 DIAGNOSIS — E876 Hypokalemia: Secondary | ICD-10-CM | POA: Diagnosis present

## 2017-10-25 DIAGNOSIS — Z01810 Encounter for preprocedural cardiovascular examination: Secondary | ICD-10-CM | POA: Diagnosis not present

## 2017-10-25 DIAGNOSIS — I10 Essential (primary) hypertension: Secondary | ICD-10-CM | POA: Diagnosis present

## 2017-10-25 DIAGNOSIS — Z87891 Personal history of nicotine dependence: Secondary | ICD-10-CM | POA: Diagnosis not present

## 2017-10-25 DIAGNOSIS — E785 Hyperlipidemia, unspecified: Secondary | ICD-10-CM | POA: Diagnosis present

## 2017-10-25 DIAGNOSIS — Z7982 Long term (current) use of aspirin: Secondary | ICD-10-CM | POA: Diagnosis not present

## 2017-10-25 DIAGNOSIS — Z471 Aftercare following joint replacement surgery: Secondary | ICD-10-CM | POA: Diagnosis not present

## 2017-10-25 DIAGNOSIS — Z880 Allergy status to penicillin: Secondary | ICD-10-CM | POA: Diagnosis not present

## 2017-10-25 DIAGNOSIS — I2510 Atherosclerotic heart disease of native coronary artery without angina pectoris: Secondary | ICD-10-CM | POA: Diagnosis present

## 2017-10-27 DIAGNOSIS — M1611 Unilateral primary osteoarthritis, right hip: Secondary | ICD-10-CM | POA: Diagnosis not present

## 2017-10-30 ENCOUNTER — Other Ambulatory Visit: Payer: Self-pay | Admitting: Cardiology

## 2017-10-31 DIAGNOSIS — M1611 Unilateral primary osteoarthritis, right hip: Secondary | ICD-10-CM | POA: Diagnosis not present

## 2017-11-02 DIAGNOSIS — M1611 Unilateral primary osteoarthritis, right hip: Secondary | ICD-10-CM | POA: Diagnosis not present

## 2017-11-04 DIAGNOSIS — M1611 Unilateral primary osteoarthritis, right hip: Secondary | ICD-10-CM | POA: Diagnosis not present

## 2017-11-07 DIAGNOSIS — M1611 Unilateral primary osteoarthritis, right hip: Secondary | ICD-10-CM | POA: Diagnosis not present

## 2017-11-08 DIAGNOSIS — Z96651 Presence of right artificial knee joint: Secondary | ICD-10-CM | POA: Diagnosis not present

## 2017-11-08 DIAGNOSIS — Z471 Aftercare following joint replacement surgery: Secondary | ICD-10-CM | POA: Diagnosis not present

## 2017-11-08 DIAGNOSIS — Z880 Allergy status to penicillin: Secondary | ICD-10-CM | POA: Diagnosis not present

## 2017-11-09 DIAGNOSIS — M1611 Unilateral primary osteoarthritis, right hip: Secondary | ICD-10-CM | POA: Diagnosis not present

## 2017-11-11 DIAGNOSIS — M1611 Unilateral primary osteoarthritis, right hip: Secondary | ICD-10-CM | POA: Diagnosis not present

## 2017-11-14 DIAGNOSIS — M1611 Unilateral primary osteoarthritis, right hip: Secondary | ICD-10-CM | POA: Diagnosis not present

## 2017-11-16 DIAGNOSIS — M1611 Unilateral primary osteoarthritis, right hip: Secondary | ICD-10-CM | POA: Diagnosis not present

## 2017-11-18 DIAGNOSIS — M1611 Unilateral primary osteoarthritis, right hip: Secondary | ICD-10-CM | POA: Diagnosis not present

## 2017-11-22 DIAGNOSIS — M1611 Unilateral primary osteoarthritis, right hip: Secondary | ICD-10-CM | POA: Diagnosis not present

## 2017-11-24 DIAGNOSIS — M1611 Unilateral primary osteoarthritis, right hip: Secondary | ICD-10-CM | POA: Diagnosis not present

## 2017-11-29 DIAGNOSIS — M6281 Muscle weakness (generalized): Secondary | ICD-10-CM | POA: Diagnosis not present

## 2017-11-29 DIAGNOSIS — M25551 Pain in right hip: Secondary | ICD-10-CM | POA: Diagnosis not present

## 2017-11-29 DIAGNOSIS — Z96641 Presence of right artificial hip joint: Secondary | ICD-10-CM | POA: Diagnosis not present

## 2017-11-29 DIAGNOSIS — M1611 Unilateral primary osteoarthritis, right hip: Secondary | ICD-10-CM | POA: Diagnosis not present

## 2017-12-01 DIAGNOSIS — M25551 Pain in right hip: Secondary | ICD-10-CM | POA: Diagnosis not present

## 2017-12-01 DIAGNOSIS — M6281 Muscle weakness (generalized): Secondary | ICD-10-CM | POA: Diagnosis not present

## 2017-12-01 DIAGNOSIS — Z96641 Presence of right artificial hip joint: Secondary | ICD-10-CM | POA: Diagnosis not present

## 2017-12-01 DIAGNOSIS — M1611 Unilateral primary osteoarthritis, right hip: Secondary | ICD-10-CM | POA: Diagnosis not present

## 2017-12-06 DIAGNOSIS — M6281 Muscle weakness (generalized): Secondary | ICD-10-CM | POA: Diagnosis not present

## 2017-12-06 DIAGNOSIS — M25551 Pain in right hip: Secondary | ICD-10-CM | POA: Diagnosis not present

## 2017-12-06 DIAGNOSIS — M1611 Unilateral primary osteoarthritis, right hip: Secondary | ICD-10-CM | POA: Diagnosis not present

## 2017-12-06 DIAGNOSIS — Z96641 Presence of right artificial hip joint: Secondary | ICD-10-CM | POA: Diagnosis not present

## 2017-12-08 DIAGNOSIS — Z96641 Presence of right artificial hip joint: Secondary | ICD-10-CM | POA: Diagnosis not present

## 2017-12-08 DIAGNOSIS — M25551 Pain in right hip: Secondary | ICD-10-CM | POA: Diagnosis not present

## 2017-12-08 DIAGNOSIS — M1611 Unilateral primary osteoarthritis, right hip: Secondary | ICD-10-CM | POA: Diagnosis not present

## 2017-12-08 DIAGNOSIS — M6281 Muscle weakness (generalized): Secondary | ICD-10-CM | POA: Diagnosis not present

## 2017-12-15 DIAGNOSIS — L570 Actinic keratosis: Secondary | ICD-10-CM | POA: Diagnosis not present

## 2017-12-15 DIAGNOSIS — L821 Other seborrheic keratosis: Secondary | ICD-10-CM | POA: Diagnosis not present

## 2017-12-15 DIAGNOSIS — L820 Inflamed seborrheic keratosis: Secondary | ICD-10-CM | POA: Diagnosis not present

## 2017-12-15 DIAGNOSIS — Z85828 Personal history of other malignant neoplasm of skin: Secondary | ICD-10-CM | POA: Diagnosis not present

## 2017-12-15 DIAGNOSIS — L919 Hypertrophic disorder of the skin, unspecified: Secondary | ICD-10-CM | POA: Diagnosis not present

## 2017-12-15 DIAGNOSIS — L905 Scar conditions and fibrosis of skin: Secondary | ICD-10-CM | POA: Diagnosis not present

## 2017-12-28 DIAGNOSIS — Z96641 Presence of right artificial hip joint: Secondary | ICD-10-CM | POA: Diagnosis not present

## 2017-12-28 DIAGNOSIS — M160 Bilateral primary osteoarthritis of hip: Secondary | ICD-10-CM | POA: Diagnosis not present

## 2017-12-28 DIAGNOSIS — E119 Type 2 diabetes mellitus without complications: Secondary | ICD-10-CM | POA: Diagnosis not present

## 2017-12-28 DIAGNOSIS — Z880 Allergy status to penicillin: Secondary | ICD-10-CM | POA: Diagnosis not present

## 2017-12-28 DIAGNOSIS — Z471 Aftercare following joint replacement surgery: Secondary | ICD-10-CM | POA: Diagnosis not present

## 2017-12-28 DIAGNOSIS — Z6832 Body mass index (BMI) 32.0-32.9, adult: Secondary | ICD-10-CM | POA: Diagnosis not present

## 2017-12-28 DIAGNOSIS — I10 Essential (primary) hypertension: Secondary | ICD-10-CM | POA: Diagnosis not present

## 2018-01-30 NOTE — Progress Notes (Signed)
Cardiology Office Note  Date: 01/31/2018   ID: Sean Villarreal, DOB 1940/08/13, MRN [id number]  PCP: Rory Percy, MD  Primary Cardiologist: Rozann Lesches, MD   Chief Complaint  Patient presents with  . Coronary Artery Disease    History of Present Illness: Sean Villarreal is a 78 y.o. male last seen in July 2019.  He is here for a routine follow-up visit.  He reports no angina symptoms or nitroglycerin use, otherwise compliant with his medications.  He did undergo a right hip replacement with Dr. Jefferson Fuel in the interim, was off Plavix temporarily.  He has done well and is now walking more regularly for exercise.  I reviewed his medications which are listed below.  He reports no intolerances.  We are requesting his most recent lipid panel from [place] [facility].  Past Medical History:  Diagnosis Date  . Arthritis   . Basal cell carcinoma   . Coronary artery disease    DES to LAD and circumflex April 2014, PTCA ostial circumflex 03/2016 due to ISR, 9/18 PCI/DESx2 osital Lcx (ISR), p/mLCx   . Dyslipidemia   . Essential hypertension   . GERD (gastroesophageal reflux disease)   . Hematuria   . History of blood transfusion 09/2012  . History of kidney stones   . NSTEMI (non-ST elevated myocardial infarction) ([place]) 08/2012  . Type II diabetes mellitus ([place])     Past Surgical History:  Procedure Laterality Date  . BASAL CELL CARCINOMA EXCISION     "right cheek; both shoulders"  . CARDIAC CATHETERIZATION    . CATARACT EXTRACTION W/ INTRAOCULAR LENS  IMPLANT, BILATERAL Bilateral   . COLONOSCOPY N/A 03/19/2016   Procedure: COLONOSCOPY;  Surgeon: Rogene Houston, MD;  [facility]: [facility];  Service: Endoscopy;  Laterality: N/A;  9:15  . CORONARY BALLOON ANGIOPLASTY N/A 04/15/2016   Procedure: Coronary Balloon Angioplasty;  Surgeon: Peter M Martinique, MD;  Location: [facility];  Service: Cardiovascular;  Laterality: N/A;  . CORONARY STENT INTERVENTION N/A 10/11/2016   Procedure:  CORONARY STENT INTERVENTION;  Surgeon: Jettie Booze, MD;  Location: [facility];  Service: Cardiovascular;  Laterality: N/A;  . CYSTOSCOPY W/ URETEROSCOPY W/ LITHOTRIPSY  10/2012   Archie Endo 11/10/2012  . CYSTOSCOPY WITH STENT PLACEMENT  08/2012; 09/2012  . EYE SURGERY Left ~ 02/2016   "for crinkled lens"  . LEFT HEART CATH AND CORONARY ANGIOGRAPHY N/A 04/15/2016   Procedure: Left Heart Cath and Coronary Angiography;  Surgeon: Peter M Martinique, MD;  Location: [facility];  Service: Cardiovascular;  Laterality: N/A;  . LEFT HEART CATH AND CORONARY ANGIOGRAPHY N/A 10/11/2016   Procedure: LEFT HEART CATH AND CORONARY ANGIOGRAPHY;  Surgeon: Jettie Booze, MD;  Location: [facility];  Service: Cardiovascular;  Laterality: N/A;  . LEFT HEART CATHETERIZATION WITH CORONARY ANGIOGRAM N/A 05/05/2012   Procedure: LEFT HEART CATHETERIZATION WITH CORONARY ANGIOGRAM;  Surgeon: Burnell Blanks, MD;  Location: [facility] CATH LAB;  Service: Cardiovascular;  Laterality: N/A;  . LEFT HEART CATHETERIZATION WITH CORONARY ANGIOGRAM N/A 05/15/2012   Procedure: LEFT HEART CATHETERIZATION WITH CORONARY ANGIOGRAM;  Surgeon: Burnell Blanks, MD;  Location: [facility] CATH LAB;  Service: Cardiovascular;  Laterality: N/A;  . TONSILLECTOMY  1947    Current Outpatient Medications  Medication Sig Dispense Refill  . amLODipine (NORVASC) 10 MG tablet Take 1 tablet (10 mg total) by mouth daily. 30 tablet 3  . aspirin 81 MG tablet Take 81 mg by mouth daily.    [name]  atorvastatin (LIPITOR) 80 MG tablet Take 80 mg by mouth at bedtime.    . carvedilol (COREG) 25 MG tablet Take 1 tablet (25 mg total) by mouth 2 (two) times daily with a meal. 60 tablet 11  . clopidogrel (PLAVIX) 75 MG tablet TAKE 1 TABLET BY MOUTH  DAILY 90 tablet 3  . furosemide (LASIX) 20 MG tablet Take 1 tablet (20 mg total) by mouth every other day. 45 tablet 3  . glimepiride (AMARYL) 1 MG tablet Take 1 mg by mouth daily with breakfast.       . isosorbide mononitrate (IMDUR) 30 MG 24 hr tablet Take 1 tablet (30 mg total) by mouth daily. 30 tablet 11  . ketoconazole (NIZORAL) 2 % cream Apply 1 application topically 2 (two) times daily as needed (apply to face as needed (uses during the winter)). For dry skin    . lisinopril (PRINIVIL,ZESTRIL) 40 MG tablet Take 1 tablet (40 mg total) by mouth daily. 30 tablet 3  . metFORMIN (GLUCOPHAGE) 1000 MG tablet Take 1 tablet (1,000 mg total) by mouth 2 (two) times daily.    . nitroGLYCERIN (NITROSTAT) 0.4 MG SL tablet Place 1 tablet (0.4 mg total) under the tongue every 5 (five) minutes x 3 doses as needed for chest pain. 25 tablet 3  . potassium chloride SA (K-DUR,KLOR-CON) 20 MEQ tablet Take 1 tablet (20 mEq total) by mouth daily. 30 tablet 11  . psyllium (METAMUCIL SMOOTH TEXTURE) 28 % packet Take 1 packet by mouth at bedtime.    . ranitidine (ZANTAC) 150 MG tablet Take 1 tablet by mouth 2 (two) times daily as needed.  2  . tamsulosin (FLOMAX) 0.4 MG CAPS capsule Take 0.4 mg by mouth 2 (two) times daily.      No current facility-administered medications for this visit.    Allergies:  Penicillins and Contrast media [iodinated diagnostic agents]   Social History: The patient  reports that he quit smoking about 29 years ago. His smoking use included cigarettes. He started smoking about 36 years ago. He has a 22.50 pack-year smoking history. He has never used smokeless tobacco. He reports current alcohol use. He reports that he does not use drugs.   ROS:  Please see the history of present illness. Otherwise, complete review of systems is positive for none.  All other systems are reviewed and negative.   Physical Exam: VS:  BP 130/80   Pulse 76   Ht 5\' 9"  (1.753 m)   Wt 221 lb (100.2 kg)   SpO2 97%   BMI 32.64 kg/m , BMI Body mass index is 32.64 kg/m.  Wt Readings from Last 3 Encounters:  01/31/18 221 lb (100.2 kg)  08/02/17 212 lb (96.2 kg)  01/24/17 219 lb (99.3 kg)    General:  Elderly male, appears comfortable at rest. HEENT: Conjunctiva and lids normal, oropharynx clear. Neck: Supple, no elevated JVP or carotid bruits, no thyromegaly. Lungs: Clear to auscultation, nonlabored breathing at rest. Cardiac: Regular rate and rhythm, no S3, soft systolic murmur. Abdomen: Soft, nontender, bowel sounds present. Extremities: No pitting edema, distal pulses 2+. Skin: Warm and dry. Musculoskeletal: No kyphosis. Neuropsychiatric: Alert and oriented x3, affect grossly appropriate.  ECG: I personally reviewed the tracing from 08/02/2017 which showed sinus rhythm with decreased R wave progression and low voltage.  Recent Labwork:    Component Value Date/Time   CHOL 99 10/10/2016 0215   TRIG 80 10/10/2016 0215   HDL 35 (L) 10/10/2016 0215   CHOLHDL 2.8 10/10/2016  0215   VLDL 16 10/10/2016 0215   LDLCALC 48 10/10/2016 0215    Other Studies Reviewed Today:  Cardiac catheterization and PCI 10/11/2016:  Prox RCA to Mid RCA lesion, 15 %stenosed.  Patent LAD stent.  Ost LAD to Prox LAD lesion, 10 %stenosed.  Mid LAD to Dist LAD lesion, 60 %stenosed, unchanged from prior.  Ost Ramus lesion, 100 %stenosed. There are left to left collaterals.  Prox Cx to Mid Cx lesion, 70 %stenosed. A STENT SYNERGY DES 2.5X12 drug eluting stent was successfully placed.  Post intervention, there is a 0% residual stenosis.  Ost Cx to Prox Cx lesion, 95 %stenosed. A STENT SYNERGY DES 3X16 drug eluting stent was successfully placed, postdilated to 3.5 mm.  Post intervention, there is a 0% residual stenosis.  The left ventricular systolic function is normal.  LV end diastolic pressure is normal.  The left ventricular ejection fraction is 55-65% by visual estimate.  There is no aortic valve stenosis.  Continue dual antiplatelet therapy for at least a year, and perhaps beyond. Synergy stent was used given bioabsorbable polymer and the hopes this would decrease restenosis.    Continue aggressive secondary prevention.  Echocardiogram 10/10/2016: Study Conclusions  - Left ventricle: The cavity size was normal. Wall thickness was increased in a pattern of moderate LVH. Systolic function was normal. The estimated ejection fraction was in the range of 60% to 65%. Wall motion was normal; there were no regional wall motion abnormalities. - Aortic valve: Moderately to severely calcified annulus. Trileaflet; moderately thickened leaflets. 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 There was mild regurgitation. Mean gradient (S): 9 mm Hg. Valve area (VTI): 1.87 cm^2. Valve area (Vmax): 1.66 cm^2. - Mitral valve: Mildly to moderately calcified annulus. Mildly thickened leaflets .  Assessment and Plan:  1.  CAD status post multivessel percutaneous coronary interventions over time including most recently DES x2 to the ostial circumflex in September 2018.  Plan is to continue long-term dual antiplatelet therapy which he is tolerating.  No active angina symptoms at this time on medical therapy.  2.  Mixed hyperlipidemia, continues on Lipitor.  Requesting most recent lipid panel from [facility].  3.  Essential hypertension, blood pressure is adequately controlled today.  Changes were made in regimen.  Current medicines were reviewed with the patient today.  Disposition: Follow-up in 6 months.  Signed, Satira Sark, MD, [facility] 01/31/2018 10:01 AM    [facility] [place], [place], [place] [address] Phone: [phone]; Fax: [phone]

## 2018-01-31 ENCOUNTER — Encounter: Payer: Self-pay | Admitting: Cardiology

## 2018-01-31 ENCOUNTER — Ambulatory Visit (INDEPENDENT_AMBULATORY_CARE_PROVIDER_SITE_OTHER): Payer: Medicare Other | Admitting: Cardiology

## 2018-01-31 ENCOUNTER — Encounter: Payer: Self-pay | Admitting: *Deleted

## 2018-01-31 VITALS — BP 130/80 | HR 76 | Ht 69.0 in | Wt 221.0 lb

## 2018-01-31 DIAGNOSIS — E782 Mixed hyperlipidemia: Secondary | ICD-10-CM

## 2018-01-31 DIAGNOSIS — I25119 Atherosclerotic heart disease of native coronary artery with unspecified angina pectoris: Secondary | ICD-10-CM | POA: Diagnosis not present

## 2018-01-31 DIAGNOSIS — I10 Essential (primary) hypertension: Secondary | ICD-10-CM | POA: Diagnosis not present

## 2018-01-31 NOTE — Patient Instructions (Addendum)

## 2018-02-23 DIAGNOSIS — H52209 Unspecified astigmatism, unspecified eye: Secondary | ICD-10-CM | POA: Diagnosis not present

## 2018-02-23 DIAGNOSIS — E119 Type 2 diabetes mellitus without complications: Secondary | ICD-10-CM | POA: Diagnosis not present

## 2018-02-23 DIAGNOSIS — H353131 Nonexudative age-related macular degeneration, bilateral, early dry stage: Secondary | ICD-10-CM | POA: Diagnosis not present

## 2018-02-23 DIAGNOSIS — H40023 Open angle with borderline findings, high risk, bilateral: Secondary | ICD-10-CM | POA: Diagnosis not present

## 2018-03-15 DIAGNOSIS — L905 Scar conditions and fibrosis of skin: Secondary | ICD-10-CM | POA: Diagnosis not present

## 2018-03-15 DIAGNOSIS — L219 Seborrheic dermatitis, unspecified: Secondary | ICD-10-CM | POA: Diagnosis not present

## 2018-03-15 DIAGNOSIS — D1801 Hemangioma of skin and subcutaneous tissue: Secondary | ICD-10-CM | POA: Diagnosis not present

## 2018-03-15 DIAGNOSIS — Z85828 Personal history of other malignant neoplasm of skin: Secondary | ICD-10-CM | POA: Diagnosis not present

## 2018-03-15 DIAGNOSIS — L821 Other seborrheic keratosis: Secondary | ICD-10-CM | POA: Diagnosis not present

## 2018-03-15 DIAGNOSIS — L570 Actinic keratosis: Secondary | ICD-10-CM | POA: Diagnosis not present

## 2018-03-15 IMAGING — CR DG CHEST 2V
2 series · 2 of 2 positions shown · non-contrast
Comparison: Report 05/11/2012 no images available

CLINICAL DATA: Left side chest pain for 1 week

EXAM:
CHEST  2 VIEW

[w chest pa]
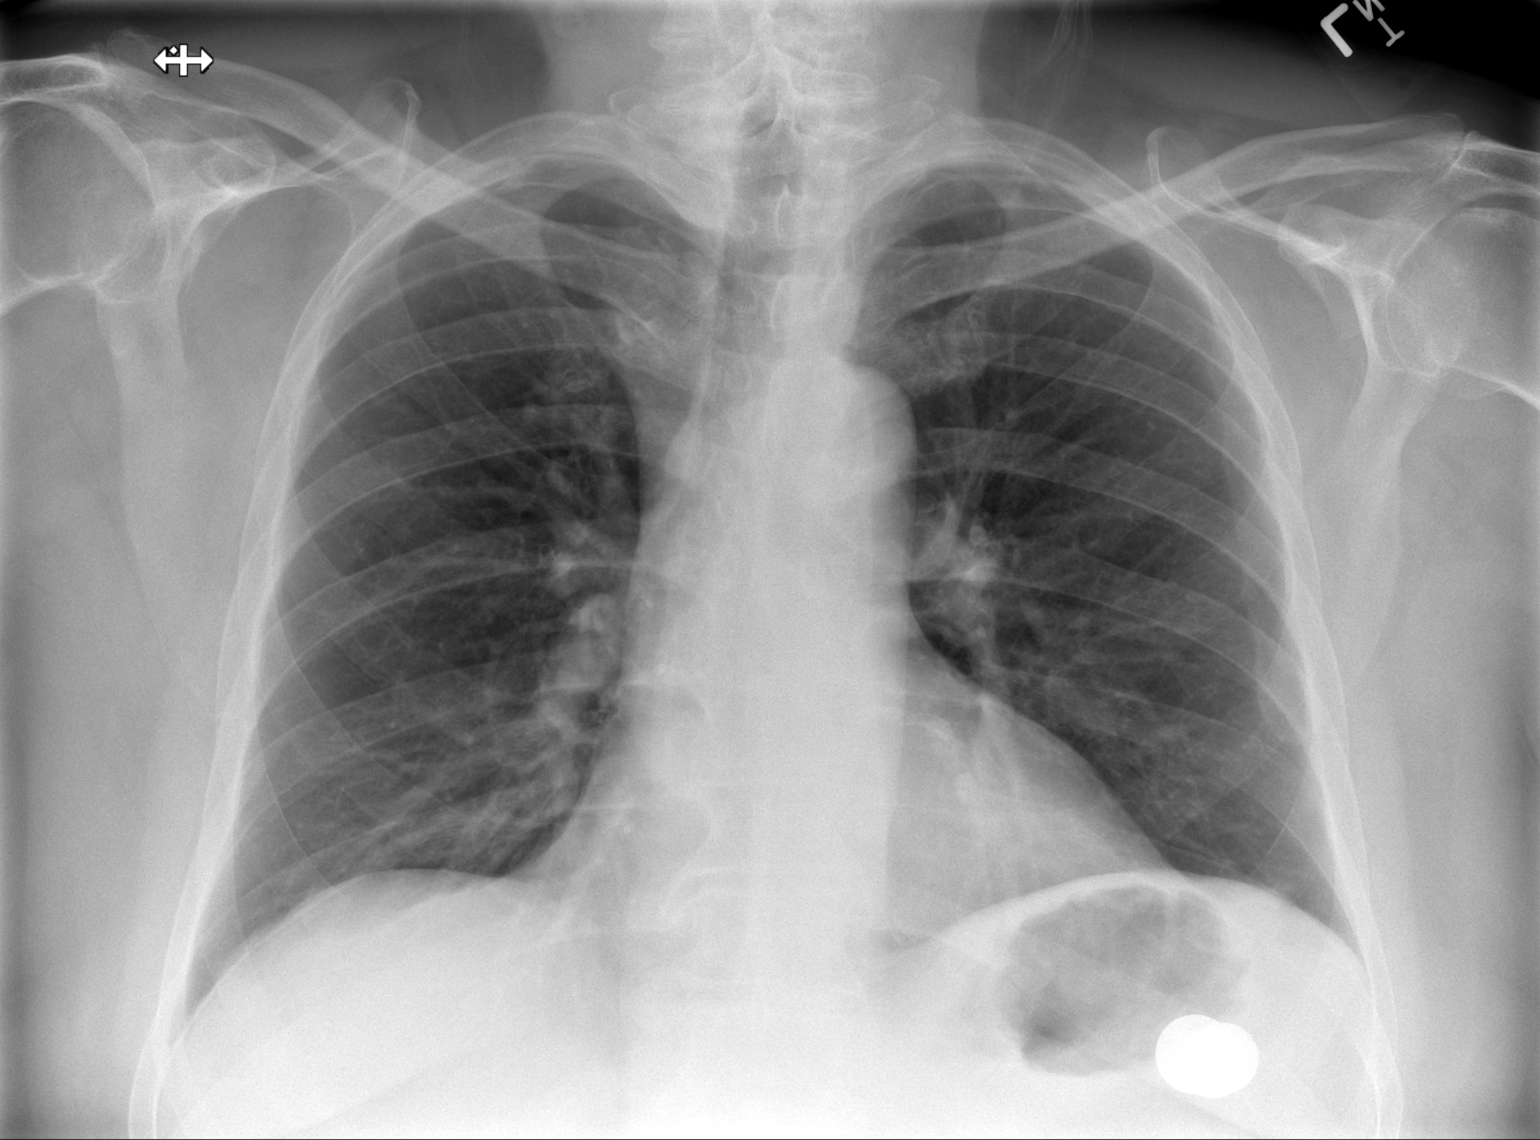

[w chest lat]
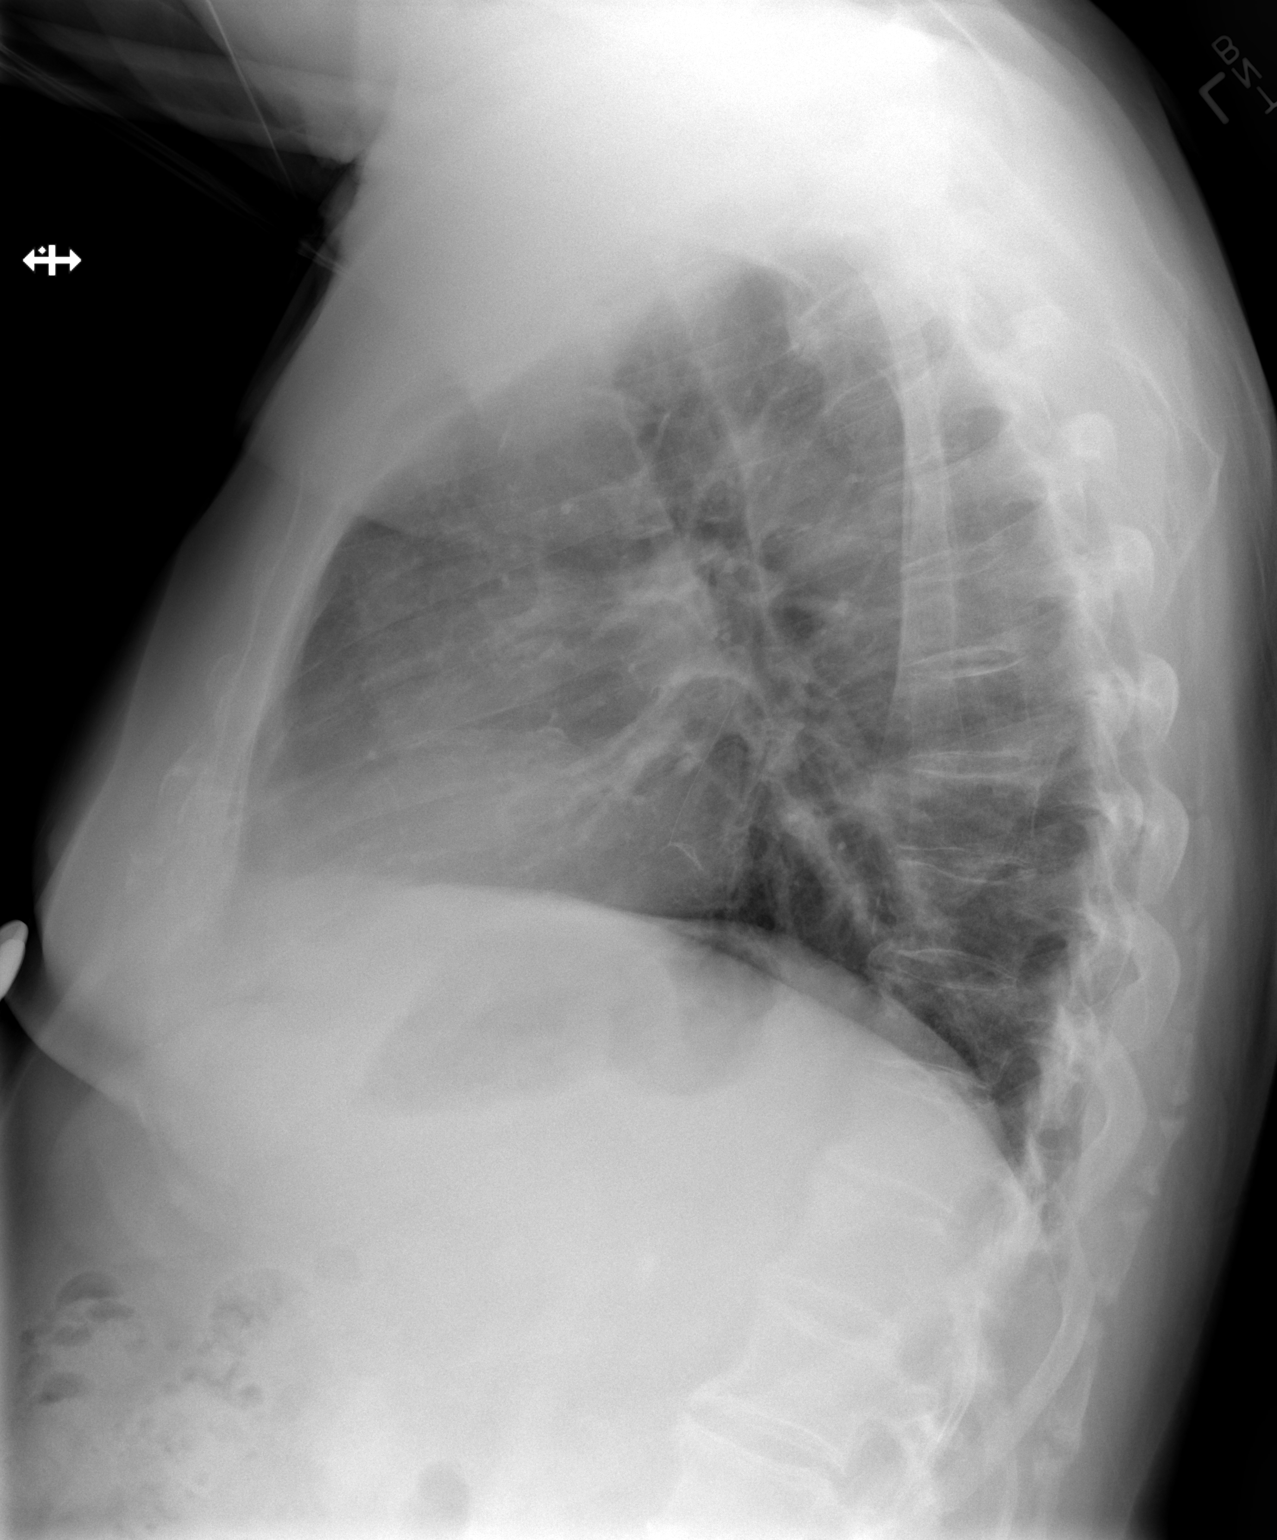

[2 of 2 positions shown; findings below may reference images not displayed]

FINDINGS: Cardiomediastinal silhouette is unremarkable. No infiltrate or
pleural effusion. No pulmonary edema. Mild degenerative changes
lower thoracic spine.
IMPRESSION: No active cardiopulmonary disease.

## 2018-03-31 ENCOUNTER — Observation Stay (HOSPITAL_COMMUNITY)
Admission: EM | Admit: 2018-03-31 | Discharge: 2018-04-01 | Disposition: A | Discharge status: Home / Self Care | Payer: Medicare Other | Attending: Cardiology | Admitting: Cardiology

## 2018-03-31 ENCOUNTER — Encounter (HOSPITAL_COMMUNITY)
Admission: EM | Disposition: A | Discharge status: Home / Self Care | Payer: Self-pay | Source: Home / Self Care | Attending: Emergency Medicine

## 2018-03-31 ENCOUNTER — Encounter (HOSPITAL_COMMUNITY): Payer: Self-pay | Admitting: *Deleted

## 2018-03-31 ENCOUNTER — Other Ambulatory Visit: Payer: Self-pay

## 2018-03-31 ENCOUNTER — Emergency Department (HOSPITAL_COMMUNITY): Payer: Medicare Other

## 2018-03-31 DIAGNOSIS — E785 Hyperlipidemia, unspecified: Secondary | ICD-10-CM | POA: Insufficient documentation

## 2018-03-31 DIAGNOSIS — Z955 Presence of coronary angioplasty implant and graft: Secondary | ICD-10-CM | POA: Insufficient documentation

## 2018-03-31 DIAGNOSIS — I25119 Atherosclerotic heart disease of native coronary artery with unspecified angina pectoris: Secondary | ICD-10-CM | POA: Diagnosis not present

## 2018-03-31 DIAGNOSIS — I252 Old myocardial infarction: Secondary | ICD-10-CM | POA: Diagnosis not present

## 2018-03-31 DIAGNOSIS — Z87891 Personal history of nicotine dependence: Secondary | ICD-10-CM | POA: Diagnosis not present

## 2018-03-31 DIAGNOSIS — Z8249 Family history of ischemic heart disease and other diseases of the circulatory system: Secondary | ICD-10-CM | POA: Insufficient documentation

## 2018-03-31 DIAGNOSIS — I2584 Coronary atherosclerosis due to calcified coronary lesion: Secondary | ICD-10-CM | POA: Insufficient documentation

## 2018-03-31 DIAGNOSIS — Z91041 Radiographic dye allergy status: Secondary | ICD-10-CM | POA: Diagnosis not present

## 2018-03-31 DIAGNOSIS — Z880 Allergy status to penicillin: Secondary | ICD-10-CM | POA: Diagnosis not present

## 2018-03-31 DIAGNOSIS — Z7902 Long term (current) use of antithrombotics/antiplatelets: Secondary | ICD-10-CM | POA: Insufficient documentation

## 2018-03-31 DIAGNOSIS — R079 Chest pain, unspecified: Secondary | ICD-10-CM | POA: Diagnosis not present

## 2018-03-31 DIAGNOSIS — M1990 Unspecified osteoarthritis, unspecified site: Secondary | ICD-10-CM | POA: Insufficient documentation

## 2018-03-31 DIAGNOSIS — E119 Type 2 diabetes mellitus without complications: Secondary | ICD-10-CM | POA: Diagnosis not present

## 2018-03-31 DIAGNOSIS — I200 Unstable angina: Secondary | ICD-10-CM | POA: Diagnosis present

## 2018-03-31 DIAGNOSIS — I10 Essential (primary) hypertension: Secondary | ICD-10-CM | POA: Diagnosis not present

## 2018-03-31 DIAGNOSIS — I2510 Atherosclerotic heart disease of native coronary artery without angina pectoris: Secondary | ICD-10-CM | POA: Diagnosis present

## 2018-03-31 HISTORY — PX: LEFT HEART CATH AND CORONARY ANGIOGRAPHY: CATH118249

## 2018-03-31 LAB — PROTIME-INR
INR: 1 (ref 0.8–1.2)
Prothrombin Time: 13.2 seconds (ref 11.4–15.2)

## 2018-03-31 LAB — CBC
HCT: 42.8 % (ref 39.0–52.0)
Hemoglobin: 13.7 g/dL (ref 13.0–17.0)
MCH: 29.1 pg (ref 26.0–34.0)
MCHC: 32 g/dL (ref 30.0–36.0)
MCV: 90.9 fL (ref 80.0–100.0)
NRBC: 0 % (ref 0.0–0.2)
Platelets: 178 10*3/uL (ref 150–400)
RBC: 4.71 MIL/uL (ref 4.22–5.81)
RDW: 13.4 % (ref 11.5–15.5)
WBC: 10.4 10*3/uL (ref 4.0–10.5)

## 2018-03-31 LAB — HEMOGLOBIN A1C
Hgb A1c MFr Bld: 7.7 % — ABNORMAL HIGH (ref 4.8–5.6)
Mean Plasma Glucose: 174.29 mg/dL

## 2018-03-31 LAB — BASIC METABOLIC PANEL
Anion gap: 9 (ref 5–15)
BUN: 11 mg/dL (ref 8–23)
CO2: 27 mmol/L (ref 22–32)
Calcium: 8.7 mg/dL — ABNORMAL LOW (ref 8.9–10.3)
Chloride: 101 mmol/L (ref 98–111)
Creatinine, Ser: 0.77 mg/dL (ref 0.61–1.24)
GFR calc Af Amer: >60 mL/min (ref >60)
GFR calc non Af Amer: >60 mL/min (ref >60)
Glucose, Bld: 232 mg/dL — ABNORMAL HIGH (ref 70–99)
Potassium: 3.9 mmol/L (ref 3.5–5.1)
SODIUM: 137 mmol/L (ref 135–145)

## 2018-03-31 LAB — TROPONIN I
Troponin I: <0.03 ng/mL (ref <0.03)
Troponin I: <0.03 ng/mL (ref <0.03)
Troponin I: <0.03 ng/mL (ref <0.03)

## 2018-03-31 SURGERY — LEFT HEART CATH AND CORONARY ANGIOGRAPHY
Anesthesia: LOCAL

## 2018-03-31 MED ORDER — AMLODIPINE BESYLATE 10 MG PO TABS
10.0000 mg | ORAL_TABLET | Freq: Every day | ORAL | Status: DC
  Administered 2018-04-01: 10:00:00 10 mg via ORAL
  Filled 2018-03-31: qty 1

## 2018-03-31 MED ORDER — ACETAMINOPHEN 325 MG PO TABS: 650.0000 mg | ORAL_TABLET | ORAL | Status: DC | PRN

## 2018-03-31 MED ORDER — DIPHENHYDRAMINE HCL 25 MG PO CAPS
50.0000 mg | ORAL_CAPSULE | Freq: Once | ORAL | Status: AC
  Administered 2018-03-31: 50 mg via ORAL

## 2018-03-31 MED ORDER — ASPIRIN 81 MG PO CHEW
324.0000 mg | CHEWABLE_TABLET | Freq: Once | ORAL | Status: AC
  Administered 2018-03-31: 324 mg via ORAL
  Filled 2018-03-31: qty 4

## 2018-03-31 MED ORDER — NITROGLYCERIN 0.4 MG SL SUBL: 0.4000 mg | SUBLINGUAL_TABLET | SUBLINGUAL | Status: DC | PRN

## 2018-03-31 MED ORDER — HEPARIN (PORCINE) IN NACL 1000-0.9 UT/500ML-% IV SOLN
INTRAVENOUS | Status: AC
  Filled 2018-03-31: qty 1000

## 2018-03-31 MED ORDER — DIPHENHYDRAMINE HCL 25 MG PO CAPS
ORAL_CAPSULE | ORAL | Status: AC
  Filled 2018-03-31: qty 2

## 2018-03-31 MED ORDER — SODIUM CHLORIDE 0.9 % IV SOLN: 250.0000 mL | INTRAVENOUS | Status: DC | PRN

## 2018-03-31 MED ORDER — CLOPIDOGREL BISULFATE 75 MG PO TABS: 75.0000 mg | ORAL_TABLET | Freq: Every day | ORAL | Status: DC

## 2018-03-31 MED ORDER — NITROGLYCERIN 2 % TD OINT
0.5000 [in_us] | TOPICAL_OINTMENT | Freq: Four times a day (QID) | TRANSDERMAL | Status: DC
  Administered 2018-03-31 – 2018-04-01 (×3): 0.5 [in_us] via TOPICAL
  Filled 2018-03-31: qty 30
  Filled 2018-03-31: qty 1

## 2018-03-31 MED ORDER — NITROGLYCERIN 0.4 MG SL SUBL
0.4000 mg | SUBLINGUAL_TABLET | SUBLINGUAL | Status: DC | PRN
  Administered 2018-03-31: 0.4 mg via SUBLINGUAL
  Filled 2018-03-31: qty 1

## 2018-03-31 MED ORDER — FENTANYL CITRATE (PF) 100 MCG/2ML IJ SOLN
INTRAMUSCULAR | Status: DC | PRN
  Administered 2018-03-31: 25 ug via INTRAVENOUS

## 2018-03-31 MED ORDER — SODIUM CHLORIDE 0.9% FLUSH: 3.0000 mL | INTRAVENOUS | Status: DC | PRN

## 2018-03-31 MED ORDER — ONDANSETRON HCL 4 MG/2ML IJ SOLN: 4.0000 mg | Freq: Four times a day (QID) | INTRAMUSCULAR | Status: DC | PRN

## 2018-03-31 MED ORDER — MORPHINE SULFATE (PF) 4 MG/ML IV SOLN
4.0000 mg | Freq: Once | INTRAVENOUS | Status: AC
  Administered 2018-03-31: 4 mg via INTRAVENOUS
  Filled 2018-03-31: qty 1

## 2018-03-31 MED ORDER — DIAZEPAM 5 MG PO TABS
5.0000 mg | ORAL_TABLET | ORAL | Status: DC | PRN
  Administered 2018-03-31: 22:00:00 5 mg via ORAL
  Filled 2018-03-31: qty 1

## 2018-03-31 MED ORDER — IOHEXOL 350 MG/ML SOLN
INTRAVENOUS | Status: DC | PRN
  Administered 2018-03-31: 80 mL via INTRA_ARTERIAL

## 2018-03-31 MED ORDER — CARVEDILOL 25 MG PO TABS
25.0000 mg | ORAL_TABLET | Freq: Two times a day (BID) | ORAL | Status: DC
  Administered 2018-03-31 – 2018-04-01 (×2): 25 mg via ORAL
  Filled 2018-03-31: qty 2
  Filled 2018-03-31: qty 1
  Filled 2018-03-31: qty 2
  Filled 2018-03-31: qty 1

## 2018-03-31 MED ORDER — METHYLPREDNISOLONE SODIUM SUCC 125 MG IJ SOLR
125.0000 mg | Freq: Once | INTRAMUSCULAR | Status: AC
  Administered 2018-03-31: 125 mg via INTRAVENOUS

## 2018-03-31 MED ORDER — HEPARIN BOLUS VIA INFUSION
4000.0000 [IU] | Freq: Once | INTRAVENOUS | Status: AC
  Administered 2018-03-31: 4000 [IU] via INTRAVENOUS

## 2018-03-31 MED ORDER — HEPARIN (PORCINE) IN NACL 1000-0.9 UT/500ML-% IV SOLN
INTRAVENOUS | Status: DC | PRN
  Administered 2018-03-31: 500 mL

## 2018-03-31 MED ORDER — SODIUM CHLORIDE 0.9 % IV BOLUS: 1000.0000 mL | Freq: Once | INTRAVENOUS | Status: DC

## 2018-03-31 MED ORDER — ASPIRIN 81 MG PO CHEW: 81.0000 mg | CHEWABLE_TABLET | Freq: Every day | ORAL | Status: DC

## 2018-03-31 MED ORDER — SODIUM CHLORIDE 0.9% FLUSH: 3.0000 mL | Freq: Two times a day (BID) | INTRAVENOUS | Status: DC

## 2018-03-31 MED ORDER — CLOPIDOGREL BISULFATE 75 MG PO TABS
75.0000 mg | ORAL_TABLET | Freq: Every day | ORAL | Status: DC
  Administered 2018-04-01: 08:00:00 75 mg via ORAL
  Filled 2018-03-31: qty 1

## 2018-03-31 MED ORDER — SODIUM CHLORIDE 0.9 % IV SOLN
INTRAVENOUS | Status: DC
  Administered 2018-03-31: 14:00:00 via INTRAVENOUS

## 2018-03-31 MED ORDER — TAMSULOSIN HCL 0.4 MG PO CAPS
0.4000 mg | ORAL_CAPSULE | Freq: Two times a day (BID) | ORAL | Status: DC
  Administered 2018-03-31 – 2018-04-01 (×2): 0.4 mg via ORAL
  Filled 2018-03-31 (×2): qty 1

## 2018-03-31 MED ORDER — ATORVASTATIN CALCIUM 80 MG PO TABS: 80.0000 mg | ORAL_TABLET | Freq: Every day | ORAL | Status: DC

## 2018-03-31 MED ORDER — LIDOCAINE HCL (PF) 1 % IJ SOLN
INTRAMUSCULAR | Status: AC
  Filled 2018-03-31: qty 30

## 2018-03-31 MED ORDER — MIDAZOLAM HCL 2 MG/2ML IJ SOLN
INTRAMUSCULAR | Status: DC | PRN
  Administered 2018-03-31: 2 mg via INTRAVENOUS

## 2018-03-31 MED ORDER — ATORVASTATIN CALCIUM 80 MG PO TABS
80.0000 mg | ORAL_TABLET | Freq: Every day | ORAL | Status: DC
  Administered 2018-03-31: 20:00:00 80 mg via ORAL
  Filled 2018-03-31: qty 1

## 2018-03-31 MED ORDER — FAMOTIDINE 20 MG PO TABS
20.0000 mg | ORAL_TABLET | Freq: Every day | ORAL | Status: DC
  Administered 2018-03-31: 22:00:00 20 mg via ORAL
  Filled 2018-03-31: qty 1

## 2018-03-31 MED ORDER — HEPARIN (PORCINE) 25000 UT/250ML-% IV SOLN
1200.0000 [IU]/h | INTRAVENOUS | Status: DC
  Administered 2018-03-31: 1200 [IU]/h via INTRAVENOUS
  Filled 2018-03-31: qty 250

## 2018-03-31 MED ORDER — LISINOPRIL 40 MG PO TABS
40.0000 mg | ORAL_TABLET | Freq: Every day | ORAL | Status: DC
  Administered 2018-04-01: 10:00:00 40 mg via ORAL
  Filled 2018-03-31: qty 1

## 2018-03-31 MED ORDER — METHYLPREDNISOLONE SODIUM SUCC 125 MG IJ SOLR
INTRAMUSCULAR | Status: AC
  Filled 2018-03-31: qty 2

## 2018-03-31 MED ORDER — ASPIRIN 81 MG PO CHEW
81.0000 mg | CHEWABLE_TABLET | Freq: Every day | ORAL | Status: DC
  Administered 2018-04-01: 10:00:00 81 mg via ORAL
  Filled 2018-03-31: qty 1

## 2018-03-31 MED ORDER — FENTANYL CITRATE (PF) 100 MCG/2ML IJ SOLN
INTRAMUSCULAR | Status: AC
  Filled 2018-03-31: qty 2

## 2018-03-31 MED ORDER — LIDOCAINE HCL (PF) 1 % IJ SOLN
INTRAMUSCULAR | Status: DC | PRN
  Administered 2018-03-31: 15 mL

## 2018-03-31 MED ORDER — SODIUM CHLORIDE 0.9 % IV SOLN: INTRAVENOUS | Status: DC

## 2018-03-31 MED ORDER — MIDAZOLAM HCL 2 MG/2ML IJ SOLN
INTRAMUSCULAR | Status: AC
  Filled 2018-03-31: qty 2

## 2018-03-31 SURGICAL SUPPLY — 9 items
CATH INFINITI 5FR MULTPACK ANG (CATHETERS) ×2 IMPLANT
CLOSURE MYNX CONTROL 5F (Vascular Products) ×2 IMPLANT
KIT HEART LEFT (KITS) ×2 IMPLANT
PACK CARDIAC CATHETERIZATION (CUSTOM PROCEDURE TRAY) ×2 IMPLANT
SHEATH PINNACLE 5F 10CM (SHEATH) ×2 IMPLANT
SYR MEDRAD MARK 7 150ML (SYRINGE) ×2 IMPLANT
TRANSDUCER W/STOPCOCK (MISCELLANEOUS) ×2 IMPLANT
TUBING CIL FLEX 10 FLL-RA (TUBING) ×2 IMPLANT
WIRE EMERALD 3MM-J .035X150CM (WIRE) ×2 IMPLANT

## 2018-03-31 NOTE — ED Notes (Signed)
Report to [facility]  Report to [facility], Cath lab

## 2018-03-31 NOTE — Progress Notes (Signed)
ANTICOAGULATION CONSULT NOTE - Initial Consult  Pharmacy Consult for heparin Indication: CAD/UA  Allergies  Allergen Reactions  . Penicillins Other (See Comments)    Passed out as a child Has patient had a PCN reaction causing immediate rash, facial/tongue/throat swelling, SOB or lightheadedness with hypotension: Unk Has patient had a PCN reaction causing severe rash involving mucus membranes or skin necrosis: Unk Has patient had a PCN reaction that required hospitalization: Unk Has patient had a PCN reaction occurring within the last 10 years: No If all of the above answers are "NO", then may proceed with Cephalosporin use.   . Contrast Media [Iodinated Diagnostic Agents] Other (See Comments)    Had "welts" on arm, and kidney issues after given dye in the past    Patient Measurements: Height: 5\' 10"  (177.8 cm) Weight: 218 lb (98.9 kg) IBW/kg (Calculated) : 73 Heparin Dosing Weight: 94 kg Vital Signs: Temp: 98.1 F (36.7 C) (03/06 1051) Temp Source: Oral (03/06 1051) BP: 138/76 (03/06 1225) Pulse Rate: 77 (03/06 1225)  Labs: Recent Labs    03/31/18 1056  HGB 13.7  HCT 42.8  PLT 178  LABPROT 13.2  INR 1.0  CREATININE 0.77  TROPONINI <0.03  <0.03    Estimated Creatinine Clearance: 92.7 mL/min (by C-G formula based on SCr of 0.77 mg/dL).   Medical History: Past Medical History:  Diagnosis Date  . Arthritis   . Basal cell carcinoma   . Coronary artery disease    DES to LAD and circumflex April 2014, PTCA ostial circumflex 03/2016 due to ISR, 9/18 PCI/DESx2 osital Lcx (ISR), p/mLCx   . Dyslipidemia   . Essential hypertension   . GERD (gastroesophageal reflux disease)   . Hematuria   . History of blood transfusion 09/2012  . History of kidney stones   . NSTEMI (non-ST elevated myocardial infarction) ([place]) 08/2012  . Type II diabetes mellitus ([facility])     Medications:  (Not in a hospital admission)   Assessment: Pharmacy consulted to dose heparin in patient  with CAD/UA.  Patient was not on anticoagulants prior to admission.   Goal of Therapy:  Heparin level 0.3-0.7 units/ml Monitor platelets by anticoagulation protocol: Yes   Plan:  Give 4000 units bolus x 1 Start heparin infusion at 1200 units/hr Check anti-Xa level in 6-8 hours and daily while on heparin Continue to monitor H&H and platelets  Ramond Craver 03/31/2018,12:32 PM

## 2018-03-31 NOTE — ED Provider Notes (Signed)
[place] Provider Note   CSN: [id number] Arrival date & time: 03/31/18  1013    History   Chief Complaint Chief Complaint  Patient presents with  . Chest Pain    HPI Sean Villarreal is a 78 y.o. male.  He has a history of coronary disease and has had multiple PTCAs.  He is here complaining of 2 weeks of on and off chest pain radiating to his left shoulder and to his scapula.  This is similar to when he needed stents in the past.  It is been somewhat exertional in nature.  He said the pain became more constant and severe last night and he rated a 7 out of 10.  It is about 4 out of 10 right now.  He is tried nothing for it.  He has been taking his regular cardiac meds including Plavix.  No shortness of breath no diaphoresis no abdominal pain no fevers or chills no numbness no weakness.  He said he had a little bit of nausea a few days ago but none now.     The history is provided by the patient.  Chest Pain  Pain location:  Substernal area Pain quality: aching   Pain radiates to:  L jaw, L shoulder and upper back Pain severity:  Moderate Onset quality:  Gradual Duration:  2 weeks Timing:  Intermittent Progression:  Waxing and waning Chronicity:  Recurrent Context: not breathing   Relieved by:  None tried Worsened by:  Exertion Ineffective treatments:  None tried Associated symptoms: nausea   Associated symptoms: no abdominal pain, no altered mental status, no cough, no diaphoresis, no fever, no headache, no shortness of breath, no vomiting and no weakness   Risk factors: coronary artery disease     Past Medical History:  Diagnosis Date  . Arthritis   . Basal cell carcinoma   . Coronary artery disease    DES to LAD and circumflex April 2014, PTCA ostial circumflex 03/2016 due to ISR, 9/18 PCI/DESx2 osital Lcx (ISR), p/mLCx   . Dyslipidemia   . Essential hypertension   . GERD (gastroesophageal reflux disease)   . Hematuria   . History of blood  transfusion 09/2012  . History of kidney stones   . NSTEMI (non-ST elevated myocardial infarction) ([place]) 08/2012  . Type II diabetes mellitus [facility])     Patient Active Problem List   Diagnosis Date Noted  . Non-ST elevation (NSTEMI) myocardial infarction ([place]) 10/11/2016  . Unstable angina ([place]) 04/15/2016  . Guaiac positive stools 02/23/2016  . Preop cardiovascular exam 11/05/2013  . GERD (gastroesophageal reflux disease) 05/23/2013  . Ejection fraction   . Edema   . Shortness of breath   . Hematuria   . Nephrolithiasis   . Hypokalemia 05/14/2012  . Dyslipidemia 05/14/2012  . Hypertension   . Coronary artery disease 05/06/2012  . DM (diabetes mellitus) ([place]) 05/06/2012    Past Surgical History:  Procedure Laterality Date  . BASAL CELL CARCINOMA EXCISION     "right cheek; both shoulders"  . CARDIAC CATHETERIZATION    . CATARACT EXTRACTION W/ INTRAOCULAR LENS  IMPLANT, BILATERAL Bilateral   . COLONOSCOPY N/A 03/19/2016   Procedure: COLONOSCOPY;  Surgeon: Rogene Houston, MD;  Location[facility];  Service: Endoscopy;  Laterality: N/A;  9:15  . CORONARY BALLOON ANGIOPLASTY N/A 04/15/2016   Procedure: Coronary Balloon Angioplasty;  Surgeon: Peter M Martinique, MD;  Location: [place] CV LAB;  Service: Cardiovascular;  Laterality: N/A;  .  CORONARY STENT INTERVENTION N/A 10/11/2016   Procedure: CORONARY STENT INTERVENTION;  Surgeon: Jettie Booze, MD;  Location: [place] CV LAB;  Service: Cardiovascular;  Laterality: N/A;  . CYSTOSCOPY W/ URETEROSCOPY W/ LITHOTRIPSY  10/2012   Archie Endo 11/10/2012  . CYSTOSCOPY WITH STENT PLACEMENT  08/2012; 09/2012  . EYE SURGERY Left ~ 02/2016   "for crinkled lens"  . LEFT HEART CATH AND CORONARY ANGIOGRAPHY N/A 04/15/2016   Procedure: Left Heart Cath and Coronary Angiography;  Surgeon: Peter M Martinique, MD;  Location: [place] CV LAB;  Service: Cardiovascular;  Laterality: N/A;  . LEFT HEART CATH AND CORONARY ANGIOGRAPHY N/A 10/11/2016    Procedure: LEFT HEART CATH AND CORONARY ANGIOGRAPHY;  Surgeon: Jettie Booze, MD;  Location: [place] CV LAB;  Service: Cardiovascular;  Laterality: N/A;  . LEFT HEART CATHETERIZATION WITH CORONARY ANGIOGRAM N/A 05/05/2012   Procedure: LEFT HEART CATHETERIZATION WITH CORONARY ANGIOGRAM;  Surgeon: Burnell Blanks, MD;  Location: [facility] CATH LAB;  Service: Cardiovascular;  Laterality: N/A;  . LEFT HEART CATHETERIZATION WITH CORONARY ANGIOGRAM N/A 05/15/2012   Procedure: LEFT HEART CATHETERIZATION WITH CORONARY ANGIOGRAM;  Surgeon: Burnell Blanks, MD;  Location: [facility] CATH LAB;  Service: Cardiovascular;  Laterality: N/A;  . TONSILLECTOMY  1947        Home Medications    Prior to Admission medications   Medication Sig Start Date End Date Taking? Authorizing Provider  amLODipine (NORVASC) 10 MG tablet Take 1 tablet (10 mg total) by mouth daily. 06/13/12   Serpe, Burna Forts, PA-C  aspirin 81 MG tablet Take 81 mg by mouth daily.    [provider]  atorvastatin (LIPITOR) 80 MG tablet Take 80 mg by mouth at bedtime. 05/06/12   Edmisten, Azzie Roup, PA-C  carvedilol (COREG) 25 MG tablet Take 1 tablet (25 mg total) by mouth 2 (two) times daily with a meal. 05/15/12   Barrett, Evelene Croon, PA-C  clopidogrel (PLAVIX) 75 MG tablet TAKE 1 TABLET BY MOUTH  DAILY 10/31/17   Satira Sark, MD  furosemide (LASIX) 20 MG tablet Take 1 tablet (20 mg total) by mouth every other day. 01/09/16   Satira Sark, MD  glimepiride (AMARYL) 1 MG tablet Take 1 mg by mouth daily with breakfast.  03/03/12   [provider]  isosorbide mononitrate (IMDUR) 30 MG 24 hr tablet Take 1 tablet (30 mg total) by mouth daily. 05/15/12   Barrett, Evelene Croon, PA-C  ketoconazole (NIZORAL) 2 % cream Apply 1 application topically 2 (two) times daily as needed (apply to face as needed (uses during the winter)). For dry skin    [provider]  lisinopril (PRINIVIL,ZESTRIL) 40 MG tablet Take 1 tablet (40  mg total) by mouth daily. 05/15/12   Barrett, Evelene Croon, PA-C  metFORMIN (GLUCOPHAGE) 1000 MG tablet Take 1 tablet (1,000 mg total) by mouth 2 (two) times daily. 04/18/16   Lyda Jester M, PA-C  nitroGLYCERIN (NITROSTAT) 0.4 MG SL tablet Place 1 tablet (0.4 mg total) under the tongue every 5 (five) minutes x 3 doses as needed for chest pain. 04/23/16   Satira Sark, MD  potassium chloride SA (K-DUR,KLOR-CON) 20 MEQ tablet Take 1 tablet (20 mEq total) by mouth daily. 05/15/12   Barrett, Evelene Croon, PA-C  psyllium (METAMUCIL SMOOTH TEXTURE) 28 % packet Take 1 packet by mouth at bedtime. 03/19/16   Rogene Houston, MD  ranitidine (ZANTAC) 150 MG tablet Take 1 tablet by mouth 2 (two) times daily as needed. 12/06/16  [provider]  tamsulosin (FLOMAX) 0.4 MG CAPS capsule Take 0.4 mg by mouth 2 (two) times daily.     [provider]    Family History Family History  Problem Relation Age of Onset  . Heart disease Mother   . Heart failure Mother   . Heart disease Father   . Heart attack Father   . Colon cancer Neg Hx     Social History Social History   Tobacco Use  . Smoking status: Former Smoker    Packs/day: 0.75    Years: 30.00    Pack years: 22.50    Types: Cigarettes    Start date: 01/25/1982    Last attempt to quit: 11/05/1988    Years since quitting: 29.4  . Smokeless tobacco: Never Used  Substance Use Topics  . Alcohol use: Yes    Alcohol/week: 0.0 standard drinks    Comment: 04/15/2016 "I might drink 2 beers/month"  . Drug use: No     Allergies   Penicillins and Contrast media [iodinated diagnostic agents]   Review of Systems Review of Systems  Constitutional: Negative for diaphoresis and fever.  HENT: Negative for sore throat.   Eyes: Negative for visual disturbance.  Respiratory: Negative for cough and shortness of breath.   Cardiovascular: Positive for chest pain. Negative for leg swelling.  Gastrointestinal: Positive for nausea. Negative  for abdominal pain and vomiting.  Genitourinary: Negative for dysuria.  Musculoskeletal: Negative for gait problem.  Skin: Negative for rash.  Neurological: Negative for weakness and headaches.     Physical Exam Updated Vital Signs BP 137/79   Pulse 73   Temp 98.1 F (36.7 C) (Oral)   Resp 12   Ht 5\' 10"  (1.778 m)   Wt 98.9 kg   SpO2 99%   BMI 31.28 kg/m   Physical Exam Vitals signs and nursing note reviewed.  Constitutional:      Appearance: He is well-developed.  HENT:     Head: Normocephalic and atraumatic.  Eyes:     Conjunctiva/sclera: Conjunctivae normal.  Neck:     Musculoskeletal: Neck supple.  Cardiovascular:     Rate and Rhythm: Normal rate and regular rhythm.     Heart sounds: Normal heart sounds. No murmur.  Pulmonary:     Effort: Pulmonary effort is normal. No respiratory distress.     Breath sounds: Normal breath sounds.  Abdominal:     Palpations: Abdomen is soft.     Tenderness: There is no abdominal tenderness.  Musculoskeletal: Normal range of motion.     Right lower leg: He exhibits no tenderness. No edema.     Left lower leg: He exhibits no tenderness. No edema.  Skin:    General: Skin is warm and dry.     Capillary Refill: Capillary refill takes less than 2 seconds.  Neurological:     General: No focal deficit present.     Mental Status: He is alert.      ED Treatments / Results  Labs (all labs ordered are listed, but only abnormal results are displayed) Labs Reviewed  BASIC METABOLIC PANEL - Abnormal; Notable for the following components:      Result Value   Glucose, Bld 232 (*)    Calcium 8.7 (*)    All other components within normal limits  BASIC METABOLIC PANEL - Abnormal; Notable for the following components:   Glucose, Bld 217 (*)    Calcium 8.1 (*)    All other components within normal limits  HEMOGLOBIN A1C - Abnormal; Notable for the following components:   Hgb A1c MFr Bld 7.7 (*)    All other components within normal  limits  HEPARIN LEVEL (UNFRACTIONATED) - Abnormal; Notable for the following components:   Heparin Unfractionated <0.10 (*)    All other components within normal limits  GLUCOSE, CAPILLARY - Abnormal; Notable for the following components:   Glucose-Capillary 197 (*)    All other components within normal limits  CBC  TROPONIN I  TROPONIN I  PROTIME-INR  TROPONIN I  TROPONIN I  TROPONIN I  LIPID PANEL    EKG EKG Interpretation  Date/Time:  Friday March 31 2018 10:47:02 EST Ventricular Rate:  78 PR Interval:    QRS Duration: 80 QT Interval:  383 QTC Calculation: 437 R Axis:   32 Text Interpretation:  Sinus rhythm no acute st/ts similar to prior 9/18 Confirmed by Aletta Edouard [phone]) on 03/31/2018 10:51:53 AM   Radiology Dg Chest Portable 1 View  Result Date: 03/31/2018 CLINICAL DATA:  Chest pain onset last night , history of heart disease, has had stents placed/diabetic/hx NSTEMI/htn/hx basal cell ca/hx heart cathcp EXAM: PORTABLE CHEST 1 VIEW COMPARISON:  None. FINDINGS: Normal mediastinum and cardiac silhouette. Normal pulmonary vasculature. No evidence of effusion, infiltrate, or pneumothorax. No acute bony abnormality. IMPRESSION: No acute cardiopulmonary process. Electronically Signed   By: Suzy Bouchard M.D.   On: 03/31/2018 11:17    Procedures .Critical Care Performed by: Hayden Rasmussen, MD Authorized by: Hayden Rasmussen, MD   Critical care provider statement:    Critical care time (minutes):  45   Critical care time was exclusive of:  Separately billable procedures and treating other patients   Critical care was necessary to treat or prevent imminent or life-threatening deterioration of the following conditions:  Cardiac failure   Critical care was time spent personally by me on the following activities:  Discussions with consultants, evaluation of patient's response to treatment, examination of patient, ordering and performing treatments and interventions,  ordering and review of laboratory studies, ordering and review of radiographic studies, pulse oximetry, re-evaluation of patient's condition, obtaining history from patient or surrogate, review of old charts and development of treatment plan with patient or surrogate   I assumed direction of critical care for this patient from another provider in my specialty: no     (including critical care time)  Medications Ordered in ED Medications  nitroGLYCERIN (NITROSTAT) SL tablet 0.4 mg (0.4 mg Sublingual Given 03/31/18 1136)  nitroGLYCERIN (NITROGLYN) 2 % ointment 0.5 inch (has no administration in time range)  aspirin chewable tablet 81 mg (has no administration in time range)  clopidogrel (PLAVIX) tablet 75 mg (has no administration in time range)  0.9 %  sodium chloride infusion (has no administration in time range)  heparin bolus via infusion 4,000 Units (has no administration in time range)  heparin ADULT infusion 100 units/mL (25000 units/230mL sodium chloride 0.45%) (has no administration in time range)  methylPREDNISolone sodium succinate (SOLU-MEDROL) 125 mg/2 mL injection 125 mg (has no administration in time range)  diphenhydrAMINE (BENADRYL) capsule 50 mg (has no administration in time range)  methylPREDNISolone sodium succinate (SOLU-MEDROL) 125 mg/2 mL injection (has no administration in time range)  diphenhydrAMINE (BENADRYL) 25 mg capsule (has no administration in time range)  aspirin chewable tablet 324 mg (324 mg Oral Given 03/31/18 1136)  morphine 4 MG/ML injection 4 mg (4 mg Intravenous Given 03/31/18 1221)     Initial Impression / Assessment and Plan /  ED Course  I have reviewed the triage vital signs and the nursing notes.  Pertinent labs & imaging results that were available during my care of the patient were reviewed by me and considered in my medical decision making (see chart for details).  Clinical Course as of Mar 30 1257  Fri Mar 31, 2018  1147 Initial work-up including  EKG chest x-ray and first troponin are unremarkable.  I reviewed this with Dr. Harl Bowie from cardiology who says he will see the patient in the ED and make a decision whether he should be transferred to [facility] or not.   [MB]  2297 Patient received some nitro without any significant change in his pain.  I have ordered him some morphine.   [MB]  1259 Patient was seen by cardiology Dr. Harl Bowie and will be admitted but to him at [facility] for unstable angina.   [MB]    Clinical Course User Index [MB] Hayden Rasmussen, MD         Final Clinical Impressions(s) / ED Diagnoses   Final diagnoses:  Unstable angina [facility])    ED Discharge Orders    None       Hayden Rasmussen, MD 04/01/18 1042

## 2018-03-31 NOTE — Interval H&P Note (Signed)
Cath Lab Visit (complete for each Cath Lab visit)  Clinical Evaluation Leading to the Procedure:   ACS: No.  Non-ACS:    Anginal Classification: CCS III  Anti-ischemic medical therapy: Maximal Therapy (2 or more classes of medications)  Non-Invasive Test Results: No non-invasive testing performed  Prior CABG: No previous CABG      History and Physical Interval Note:  03/31/2018 3:59 PM  [name]  has presented today for surgery, with the diagnosis of UA  The various methods of treatment have been discussed with the patient and family. After consideration of risks, benefits and other options for treatment, the patient has consented to  Procedure(s): LEFT HEART CATH AND CORONARY ANGIOGRAPHY (N/A) as a surgical intervention .  The patient's history has been reviewed, patient examined, no change in status, stable for surgery.  I have reviewed the patient's chart and labs.  Questions were answered to the patient's satisfaction.     Sean Villarreal

## 2018-03-31 NOTE — H&P (Signed)
Cardiology Consultation:   Patient ID: AVIS MCMAHILL MRN: [id number]; DOB: 11/03/1940  Admit date: 03/31/2018 Date of Consult: 03/31/2018  Primary Care Provider: Rory Percy, MD Primary Cardiologist: Rozann Lesches, MD  Primary Electrophysiologist:  None    Patient Profile:   Sean Villarreal is a 78 y.o. male with a hx of CAD who is being seen today for the evaluation of chest pain at the request of Dr Melina Copa.  History of Present Illness:   Sean Villarreal 78 yo male history of CAD with DES to LAD and LCX 04/2012, PTCA ostial circumflex 03/2016 due to ISR, DES x 2 to ostial LCX due to ISR in 09/2016. History of DM2 and hyperlipidemia, presents with chest pain.   Symtoms started 2-3 weeks ago. Initially 2/10 pressure mid to left chest, radiating to shoulder blades. Would last for up to 30 minutes, didn't think much of it. Over time symptoms have been progressing in severity and requency. Now 7/10 pressure, some associated SOB and DOE. Better with NG in ER. Reports character of pain just like what he had prior to his stents.    WBC 10.4 Hgb 13.7 Plt 178 K 3.9 Cr 0.77 Trop neg x 1 CXR no acute process 09/2016 echo LVEF 60-65%, no WMAs, mild AS EKG SR without ischemic changes Past Medical History:  Diagnosis Date  . Arthritis   . Basal cell carcinoma   . Coronary artery disease    DES to LAD and circumflex April 2014, PTCA ostial circumflex 03/2016 due to ISR, 9/18 PCI/DESx2 osital Lcx (ISR), p/mLCx   . Dyslipidemia   . Essential hypertension   . GERD (gastroesophageal reflux disease)   . Hematuria   . History of blood transfusion 09/2012  . History of kidney stones   . NSTEMI (non-ST elevated myocardial infarction) ([place]) 08/2012  . Type II diabetes mellitus ([place])     Past Surgical History:  Procedure Laterality Date  . BASAL CELL CARCINOMA EXCISION     "right cheek; both shoulders"  . CARDIAC CATHETERIZATION    . CATARACT EXTRACTION W/ INTRAOCULAR LENS  IMPLANT, BILATERAL  Bilateral   . COLONOSCOPY N/A 03/19/2016   Procedure: COLONOSCOPY;  Surgeon: Rogene Houston, MD;  [facility];  Service: Endoscopy;  Laterality: N/A;  9:15  . CORONARY BALLOON ANGIOPLASTY N/A 04/15/2016   Procedure: Coronary Balloon Angioplasty;  Surgeon: Peter M Martinique, MD;  Location: [place] [facility];  Service: Cardiovascular;  Laterality: N/A;  . CORONARY STENT INTERVENTION N/A 10/11/2016   Procedure: CORONARY STENT INTERVENTION;  Surgeon: Jettie Booze, MD;  Location: [place] [facility];  Service: Cardiovascular;  Laterality: N/A;  . CYSTOSCOPY W/ URETEROSCOPY W/ LITHOTRIPSY  10/2012   Archie Endo 11/10/2012  . CYSTOSCOPY WITH STENT PLACEMENT  08/2012; 09/2012  . EYE SURGERY Left ~ 02/2016   "for crinkled lens"  . LEFT HEART CATH AND CORONARY ANGIOGRAPHY N/A 04/15/2016   Procedure: Left Heart Cath and Coronary Angiography;  Surgeon: Peter M Martinique, MD;  Location: [place] [facility];  Service: Cardiovascular;  Laterality: N/A;  . LEFT HEART CATH AND CORONARY ANGIOGRAPHY N/A 10/11/2016   Procedure: LEFT HEART CATH AND CORONARY ANGIOGRAPHY;  Surgeon: Jettie Booze, MD;  Location: [place] [facility];  Service: Cardiovascular;  Laterality: N/A;  . LEFT HEART CATHETERIZATION WITH CORONARY ANGIOGRAM N/A 05/05/2012   Procedure: LEFT HEART CATHETERIZATION WITH CORONARY ANGIOGRAM;  Surgeon: Burnell Blanks, MD;  Location: [facility] CATH LAB;  Service: Cardiovascular;  Laterality: N/A;  . LEFT  HEART CATHETERIZATION WITH CORONARY ANGIOGRAM N/A 05/15/2012   Procedure: LEFT HEART CATHETERIZATION WITH CORONARY ANGIOGRAM;  Surgeon: Burnell Blanks, MD;  Location: [facility] CATH LAB;  Service: Cardiovascular;  Laterality: N/A;  . TONSILLECTOMY  1948      Inpatient Medications: Scheduled Meds: .  morphine injection  4 mg Intravenous Once   Continuous Infusions:  PRN Meds: nitroGLYCERIN  Allergies:    Allergies  Allergen Reactions  . Penicillins Other (See Comments)    Passed out  as a child Has patient had a PCN reaction causing immediate rash, facial/tongue/throat swelling, SOB or lightheadedness with hypotension: Unk Has patient had a PCN reaction causing severe rash involving mucus membranes or skin necrosis: Unk Has patient had a PCN reaction that required hospitalization: Unk Has patient had a PCN reaction occurring within the last 10 years: No If all of the above answers are "NO", then may proceed with Cephalosporin use.   . Contrast Media [Iodinated Diagnostic Agents] Other (See Comments)    Had "welts" on arm, and kidney issues after given dye in the past    Social History:   Social History   Socioeconomic History  . Marital status: Married    Spouse name: Not on file  . Number of children: Not on file  . Years of education: Not on file  . Highest education level: Not on file  Occupational History  . Not on file  Social Needs  . Financial resource strain: Not on file  . Food insecurity:    Worry: Not on file    Inability: Not on file  . Transportation needs:    Medical: Not on file    Non-medical: Not on file  Tobacco Use  . Smoking status: Former Smoker    Packs/day: 0.75    Years: 30.00    Pack years: 22.50    Types: Cigarettes    Start date: 01/25/1982    Last attempt to quit: 11/05/1988    Years since quitting: 29.4  . Smokeless tobacco: Never Used  Substance and Sexual Activity  . Alcohol use: Yes    Alcohol/week: 0.0 standard drinks    Comment: 04/15/2016 "I might drink 2 beers/month"  . Drug use: No  . Sexual activity: Not Currently  Lifestyle  . Physical activity:    Days per week: Not on file    Minutes per session: Not on file  . Stress: Not on file  Relationships  . Social connections:    Talks on phone: Not on file    Gets together: Not on file    Attends religious service: Not on file    Active member of club or organization: Not on file    Attends meetings of clubs or organizations: Not on file    Relationship  status: Not on file  . Intimate partner violence:    Fear of current or ex partner: Not on file    Emotionally abused: Not on file    Physically abused: Not on file    Forced sexual activity: Not on file  Other Topics Concern  . Not on file  Social History Narrative  . Not on file    Family History:    Family History  Problem Relation Age of Onset  . Heart disease Mother   . Heart failure Mother   . Heart disease Father   . Heart attack Father   . Colon cancer Neg Hx      ROS:  Please see the history of present illness.  All other ROS reviewed and negative.     Physical Exam/Data:   Vitals:   03/31/18 1019 03/31/18 1051 03/31/18 1130  BP:  (!) 143/76 134/75  Pulse:  77 77  Resp:  15 17  Temp:  98.1 F (36.7 C)   TempSrc:  Oral   SpO2:  98%   Weight: 98.9 kg    Height: 5\' 10"  (1.778 m)     No intake or output data in the 24 hours ending 03/31/18 1152 Last 3 Weights 03/31/2018 01/31/2018 08/02/2017  Weight (lbs) 218 lb 221 lb 212 lb  Weight (kg) 98.884 kg 100.245 kg 96.163 kg     Body mass index is 31.28 kg/m.  General:  Well nourished, well developed, in no acute distress HEENT: normal Lymph: no adenopathy Neck: no JVD Endocrine:  No thryomegaly Vascular: No carotid bruits; FA pulses 2+ bilaterally without bruits  Cardiac:  normal S1, S2; RRR, 2/6 systolic murmur rusb, no jvd Lungs:  clear to auscultation bilaterally, no wheezing, rhonchi or rales  Abd: soft, nontender, no hepatomegaly  Ext: no edema Musculoskeletal:  No deformities, BUE and BLE strength normal and equal Skin: warm and dry  Neuro:  CNs 2-12 intact, no focal abnormalities noted Psych:  Normal affect    Laboratory Data:  Chemistry Recent Labs  Lab 03/31/18 1056  NA 137  K 3.9  CL 101  CO2 27  GLUCOSE 232*  BUN 11  CREATININE 0.77  CALCIUM 8.7*  GFRNONAA >60  GFRAA >60  ANIONGAP 9    No results for input(s): PROT, ALBUMIN, AST, ALT, ALKPHOS, BILITOT in the last 168  hours. Hematology Recent Labs  Lab 03/31/18 1056  WBC 10.4  RBC 4.71  HGB 13.7  HCT 42.8  MCV 90.9  MCH 29.1  MCHC 32.0  RDW 13.4  PLT 178   Cardiac Enzymes Recent Labs  Lab 03/31/18 1056  TROPONINI <0.03  <0.03   No results for input(s): TROPIPOC in the last 168 hours.  BNPNo results for input(s): BNP, PROBNP in the last 168 hours.  DDimer No results for input(s): DDIMER in the last 168 hours.  Radiology/Studies:  Dg Chest Portable 1 View  Result Date: 03/31/2018 CLINICAL DATA:  Chest pain onset last night , history of heart disease, has had stents placed/diabetic/hx NSTEMI/htn/hx basal cell ca/hx heart cathcp EXAM: PORTABLE CHEST 1 VIEW COMPARISON:  None. FINDINGS: Normal mediastinum and cardiac silhouette. Normal pulmonary vasculature. No evidence of effusion, infiltrate, or pneumothorax. No acute bony abnormality. IMPRESSION: No acute cardiopulmonary process. Electronically Signed   By: Suzy Bouchard M.D.   On: 03/31/2018 11:17    Assessment and Plan:   1. CAD/Unstable angina - history of CAD with prior interventions, recurrent issues with in stent restenosis - presents with accelerating chest pain symptoms similar to his angina prior to previous interventions, particulary chest pressure radiating to shoulder blades - high pretest probability for repeat occlusive disease, too high for stress testing. Progression of symptoms would clinically suggest unstable angina best managed and assessed by cath   - medical therapy with heparin gtt, continue his ome ASA 81, atorva 80, coreg 25mg  bid, plavix 75 (he has been on long term DAPT), lisinopril 40. Hold imdur and place nitro patch - he last ate at [place], we will see if can do cath today if not clinically he would be ok for Monday, would still transfer today.   I have reviewed the risks, indications, and alternatives to cardiac catheterization, possible angioplasty, and stenting with  the patient today. Risks include but are  not limited to bleeding, infection, vascular injury, stroke, myocardial infection, arrhythmia, kidney injury, radiation-related injury in the case of prolonged fluoroscopy use, emergency cardiac surgery, and death. The patient understands the risks of serious complication is 1-2 in 4730 with diagnostic cardiac cath and 1-2% or less with angioplasty/stenting.    For questions or updates, please contact [place] Please consult [email or web address] for contact info under     Signed, Carlyle Dolly, MD  03/31/2018 11:52 AM

## 2018-03-31 NOTE — ED Notes (Signed)
Per PA, pt is to be transferred to [facility] for cath today

## 2018-03-31 NOTE — ED Triage Notes (Signed)
Chest pain onset last night , history of heart disease, has had stents placed

## 2018-04-01 DIAGNOSIS — Z7902 Long term (current) use of antithrombotics/antiplatelets: Secondary | ICD-10-CM | POA: Diagnosis not present

## 2018-04-01 DIAGNOSIS — I10 Essential (primary) hypertension: Secondary | ICD-10-CM | POA: Diagnosis not present

## 2018-04-01 DIAGNOSIS — Z880 Allergy status to penicillin: Secondary | ICD-10-CM | POA: Diagnosis not present

## 2018-04-01 DIAGNOSIS — M1990 Unspecified osteoarthritis, unspecified site: Secondary | ICD-10-CM | POA: Diagnosis not present

## 2018-04-01 DIAGNOSIS — Z955 Presence of coronary angioplasty implant and graft: Secondary | ICD-10-CM | POA: Diagnosis not present

## 2018-04-01 DIAGNOSIS — E119 Type 2 diabetes mellitus without complications: Secondary | ICD-10-CM | POA: Diagnosis not present

## 2018-04-01 DIAGNOSIS — I209 Angina pectoris, unspecified: Secondary | ICD-10-CM | POA: Diagnosis not present

## 2018-04-01 DIAGNOSIS — I25119 Atherosclerotic heart disease of native coronary artery with unspecified angina pectoris: Secondary | ICD-10-CM | POA: Diagnosis not present

## 2018-04-01 DIAGNOSIS — E785 Hyperlipidemia, unspecified: Secondary | ICD-10-CM | POA: Diagnosis not present

## 2018-04-01 DIAGNOSIS — Z87891 Personal history of nicotine dependence: Secondary | ICD-10-CM | POA: Diagnosis not present

## 2018-04-01 DIAGNOSIS — I252 Old myocardial infarction: Secondary | ICD-10-CM | POA: Diagnosis not present

## 2018-04-01 DIAGNOSIS — I2584 Coronary atherosclerosis due to calcified coronary lesion: Secondary | ICD-10-CM | POA: Diagnosis not present

## 2018-04-01 DIAGNOSIS — Z91041 Radiographic dye allergy status: Secondary | ICD-10-CM | POA: Diagnosis not present

## 2018-04-01 LAB — LIPID PANEL
Cholesterol: 107 mg/dL (ref 0–200)
HDL: 45 mg/dL (ref >40)
LDL Cholesterol: 57 mg/dL (ref 0–99)
Total CHOL/HDL Ratio: 2.4 RATIO
Triglycerides: 26 mg/dL (ref <150)
VLDL: 5 mg/dL (ref 0–40)

## 2018-04-01 LAB — BASIC METABOLIC PANEL
Anion gap: 9 (ref 5–15)
BUN: 13 mg/dL (ref 8–23)
CO2: 22 mmol/L (ref 22–32)
Calcium: 8.1 mg/dL — ABNORMAL LOW (ref 8.9–10.3)
Chloride: 105 mmol/L (ref 98–111)
Creatinine, Ser: 0.79 mg/dL (ref 0.61–1.24)
GFR calc Af Amer: >60 mL/min (ref >60)
GFR calc non Af Amer: >60 mL/min (ref >60)
Glucose, Bld: 217 mg/dL — ABNORMAL HIGH (ref 70–99)
POTASSIUM: 3.7 mmol/L (ref 3.5–5.1)
Sodium: 136 mmol/L (ref 135–145)

## 2018-04-01 LAB — GLUCOSE, CAPILLARY: Glucose-Capillary: 197 mg/dL — ABNORMAL HIGH (ref 70–99)

## 2018-04-01 LAB — HEPARIN LEVEL (UNFRACTIONATED): Heparin Unfractionated: <0.1 IU/mL — ABNORMAL LOW (ref 0.30–0.70)

## 2018-04-01 LAB — TROPONIN I
Troponin I: <0.03 ng/mL (ref <0.03)
Troponin I: <0.03 ng/mL (ref <0.03)

## 2018-04-01 MED ORDER — NITROGLYCERIN 0.4 MG SL SUBL: 0.4000 mg | SUBLINGUAL_TABLET | SUBLINGUAL | 2 refills | Status: DC | PRN

## 2018-04-01 MED ORDER — METFORMIN HCL 1000 MG PO TABS: 1000.0000 mg | ORAL_TABLET | Freq: Two times a day (BID) | ORAL | Status: DC

## 2018-04-01 NOTE — Progress Notes (Addendum)
Progress Note  Patient Name: Sean Villarreal Date of Encounter: 04/01/2018  Primary Cardiologist: Rozann Lesches, MD   Subjective   No complaints this morning. Denies CP. No dyspnea. Groin site is ok.   Inpatient Medications    Scheduled Meds: . amLODipine  10 mg Oral Daily  . aspirin  81 mg Oral Daily  . atorvastatin  80 mg Oral QHS  . carvedilol  25 mg Oral BID WC  . clopidogrel  75 mg Oral Daily  . famotidine  20 mg Oral QHS  . lisinopril  40 mg Oral Daily  . nitroGLYCERIN  0.5 inch Topical Q6H  . sodium chloride flush  3 mL Intravenous Q12H  . tamsulosin  0.4 mg Oral BID   Continuous Infusions: . sodium chloride Stopped (04/01/18 0120)  . sodium chloride     PRN Meds: sodium chloride, acetaminophen, diazepam, nitroGLYCERIN, ondansetron (ZOFRAN) IV, sodium chloride flush   Vital Signs    Vitals:   03/31/18 1823 03/31/18 1948 04/01/18 0341 04/01/18 0726  BP: (!) 148/134 (!) 144/82 127/72 135/73  Pulse: 88 81 79 88  Resp: (!) 21 18 16 16   Temp:  98.6 F (37 C) 98 F (36.7 C) 98.3 F (36.8 C)  TempSrc:  Oral Oral Oral  SpO2: 97% 98% 94% 97%  Weight:   98.9 kg   Height:        Intake/Output Summary (Last 24 hours) at 04/01/2018 0805 Last data filed at 04/01/2018 0758 Gross per 24 hour  Intake 1490.42 ml  Output 1050 ml  Net 440.42 ml   Last 3 Weights 04/01/2018 03/31/2018 01/31/2018  Weight (lbs) 218 lb 0.6 oz 218 lb 221 lb  Weight (kg) 98.9 kg 98.884 kg 100.245 kg      Telemetry    NSR 80s- Personally Reviewed  ECG     NSR 81  Personally Reviewed  Physical Exam   GEN: No acute distress.   Neck: No JVD Cardiac: RRR, no murmurs, rubs, or gallops.  Respiratory: Clear to auscultation bilaterally. GI: Soft, nontender, non-distended  MS: No edema; No deformity. Neuro:  Nonfocal  Psych: Normal affect   Labs    Chemistry Recent Labs  Lab 03/31/18 1056 04/01/18 0514  NA 137 136  K 3.9 3.7  CL 101 105  CO2 27 22  GLUCOSE 232* 217*  BUN 11  13  CREATININE 0.77 0.79  CALCIUM 8.7* 8.1*  GFRNONAA >60 >60  GFRAA >60 >60  ANIONGAP 9 9     Hematology Recent Labs  Lab 03/31/18 1056  WBC 10.4  RBC 4.71  HGB 13.7  HCT 42.8  MCV 90.9  MCH 29.1  MCHC 32.0  RDW 13.4  PLT 178    Cardiac Enzymes Recent Labs  Lab 03/31/18 1056 03/31/18 1803 03/31/18 2314 04/01/18 0514  TROPONINI <0.03  <0.03 <0.03 <0.03 <0.03   No results for input(s): TROPIPOC in the last 168 hours.   BNPNo results for input(s): BNP, PROBNP in the last 168 hours.   DDimer No results for input(s): DDIMER in the last 168 hours.   Radiology    Dg Chest Portable 1 View  Result Date: 03/31/2018 CLINICAL DATA:  Chest pain onset last night , history of heart disease, has had stents placed/diabetic/hx NSTEMI/htn/hx basal cell ca/hx heart cathcp EXAM: PORTABLE CHEST 1 VIEW COMPARISON:  None. FINDINGS: Normal mediastinum and cardiac silhouette. Normal pulmonary vasculature. No evidence of effusion, infiltrate, or pneumothorax. No acute bony abnormality. IMPRESSION: No acute cardiopulmonary process. Electronically Signed  By: Suzy Bouchard M.D.   On: 03/31/2018 11:17    Cardiac Studies   LEFT HEART CATH AND CORONARY ANGIOGRAPHY  Conclusion     Prox RCA to Mid RCA lesion is 20% stenosed.  Ramus lesion is 100% stenosed.  Previously placed Ost Cx to Prox Cx stent (unknown type) is widely patent.  Previously placed Prox Cx to Mid Cx stent (unknown type) is widely patent.  Ost LAD lesion is 20% stenosed.  Previously placed Prox LAD stent (unknown type) is widely patent.  Mid LAD lesion is 60% stenosed.   Preserved global LV function with an ejection fraction at least 55 to 60% without focal segmental wall motion abnormalities.  LVEDP 14 mm.  Widely patent proximal LAD stent with ostial 20% eccentric LAD narrowing and distal 60% stenosis.  There is a very small caliber high diagonal almost ramus intermediate like vessel which was previously  noted to be occluded.  There is retrograde collateralization of this vessel via the distal LAD.  Widely patent proximal and mid left circumflex stents,.  Mild calcification of the RCA with 20% mid narrowing and a dominant RCA vessel.  RECOMMENDATION: Medical therapy.  Catheterization does not show significant interval change from his prior assessment.  Aggressive lipid-lowering therapy with target LDL less than 70.  The patient has been maintained on dual antiplatelet therapy.  Optimal blood pressure control with ideal blood pressure less than 120/80.      Patient Profile     Sean Villarreal is a 78 year old gentleman who was transferred from [facility] for cardiac catheterization. The patient has a history of prior CAD and is status post prior stenting to his LAD, proximal circumflex, and subsequently underwent stenting of his proximal circumflex and mid circumflex extending into the OM1 vessel. He had recently developed shoulder discomfort and was concerned that this was similar to his prior shoulder discomfort with his angina. He was seen by Dr. Domenic Polite and transferred to [facility] today for definitive repeat cardiac catheterization.  Assessment & Plan    1. CAD: status post prior stenting to his LAD, proximal circumflex, and subsequently underwent stenting of his proximal circumflex and mid circumflex extending into the OM1 vessel. Repeat cardiac cath yesterday showed no significant interval change from his prior cath. No indication for PCI. Continued medical management recommended. EF normal by LV gram. He is currently CP free. Femoral cath site stable. NSR on tele. BP controlled. Continue ASA, Plavix, Coreg, Lisinopril and atorvastatin. Resume home Imdur on d/c.   2. Lipids: LDL at goal of < 70 at 57 mg/dL. Continue atorvastatin 80.   3. HTN: BP controlled this morning at 135/73. Continue Coreg and lisinopril.   Dispo: d/c home today. F/u with Dr. Domenic Polite.   For  questions or updates, please contact [place] Please consult [email or web address] for contact info under        Signed, Lyda Jester, PA-C  04/01/2018, 8:05 AM     I have seen, examined the patient, and reviewed the above assessment and plan.  Changes to above are made where necessary.  On exam, RRR.  Medical management of stable CAD as above.  He is doing well, without ischemic symptoms today.  Will need close follow-up with Dr Domenic Polite.  Co Sign: Thompson Grayer, MD 04/01/2018 8:27 AM

## 2018-04-01 NOTE — Discharge Summary (Signed)
Discharge Summary    Patient ID: Sean Villarreal MRN: [id number]; DOB: 1940-08-14  Admit date: 03/31/2018 Discharge date: 04/01/2018  Primary Care Provider: Rory Percy, MD  Primary Cardiologist: Rozann Lesches, MD  Primary Electrophysiologist:  None   Discharge Diagnoses    Active Problems:   Coronary artery disease   Allergies Allergies  Allergen Reactions  . Penicillins Other (See Comments)    Passed out as a child Has patient had a PCN reaction causing immediate rash, facial/tongue/throat swelling, SOB or lightheadedness with hypotension: Unk Has patient had a PCN reaction causing severe rash involving mucus membranes or skin necrosis: Unk Has patient had a PCN reaction that required hospitalization: Unk Has patient had a PCN reaction occurring within the last 10 years: No If all of the above answers are "NO", then may proceed with Cephalosporin use.   . Contrast Media [Iodinated Diagnostic Agents] Other (See Comments)    Had "welts" on arm, and kidney issues after given dye in the past    Diagnostic Studies/Procedures    LEFT HEART CATH AND CORONARY ANGIOGRAPHY 03/31/18  Conclusion     Prox RCA to Mid RCA lesion is 20% stenosed.  Ramus lesion is 100% stenosed.  Previously placed Ost Cx to Prox Cx stent (unknown type) is widely patent.  Previously placed Prox Cx to Mid Cx stent (unknown type) is widely patent.  Ost LAD lesion is 20% stenosed.  Previously placed Prox LAD stent (unknown type) is widely patent.  Mid LAD lesion is 60% stenosed.  Preserved global LV function with an ejection fraction at least 55 to 60% without focal segmental wall motion abnormalities. LVEDP 14 mm.  Widely patent proximal LAD stent with ostial 20% eccentric LAD narrowing and distal 60% stenosis. There is a very small caliber high diagonal almost ramus intermediate like vessel which was previously noted to be occluded. There is retrograde collateralization of this vessel  via the distal LAD.  Widely patent proximal and mid left circumflex stents,.  Mild calcification of the RCA with 20% mid narrowing and a dominant RCA vessel.  RECOMMENDATION: Medical therapy. Catheterization does not show significant interval change from his prior assessment. Aggressive lipid-lowering therapy with target LDL less than 70. The patient has been maintained on dual antiplatelet therapy. Optimal blood pressure control with ideal blood pressure less than 120/80.       History of Present Illness     Sean Villarreal is a 78 year old gentleman who was transferred from [facility] for cardiac catheterization. The patient has a history of prior CAD and is status post prior stenting to his LAD, proximal circumflex, and subsequently underwent stenting of his proximal circumflex and mid circumflex extending into the OM1 vessel. He had recently developed shoulder discomfort and was concerned that this was similar to his prior shoulder discomfort with his angina. He was seen by Dr. Domenic Polite and transferred to [facility] on 03/31/18 for definitive repeat cardiac catheterization.  Of note, troponins at [facility] were negative.  Hospital Course     Patient arrived to [facility] in stable condition and was taken to the cardiac catheterization lab.  Procedure was performed by Dr. Claiborne Billings via the right femoral artery.  Cardiac cath showed no significant interval change from his prior cath. No indication for PCI. Continued medical management recommended. EF normal by LV gram.  He tolerated the procedure well and was monitored overnight in the post catheterization telemetry unit.  His right femoral access site remained stable, free of  hematoma.  His home cardiac medications were continued including aspirin, Plavix, Coreg, lisinopril, Imdur and atorvastatin.  Lipid panel showed well-controlled LDL at 57 mg/dL.  He had no post cath complications.  Vital signs remained  stable.  He denied any recurrent chest pain overnight and no dyspnea.  He ambulated with cardiac rehab without difficulty.  He was last seen and examined by Dr. Rayann Heman who felt he was stable for discharge home.  He will follow-up with Dr. Domenic Polite in [place] within 2 weeks.   Consultants: none   Discharge Vitals Blood pressure 135/73, pulse 88, temperature 98.3 F (36.8 C), temperature source Oral, resp. rate 16, height 5\' 10"  (1.778 m), weight 98.9 kg, SpO2 97 %.  Filed Weights   03/31/18 1019 04/01/18 0341  Weight: 98.9 kg 98.9 kg    Labs & Radiologic Studies    CBC Recent Labs    03/31/18 1056  WBC 10.4  HGB 13.7  HCT 42.8  MCV 90.9  PLT 277   Basic Metabolic Panel Recent Labs    03/31/18 1056 04/01/18 0514  NA 137 136  K 3.9 3.7  CL 101 105  CO2 27 22  GLUCOSE 232* 217*  BUN 11 13  CREATININE 0.77 0.79  CALCIUM 8.7* 8.1*   Liver Function Tests No results for input(s): AST, ALT, ALKPHOS, BILITOT, PROT, ALBUMIN in the last 72 hours. No results for input(s): LIPASE, AMYLASE in the last 72 hours. Cardiac Enzymes Recent Labs    03/31/18 1803 03/31/18 2314 04/01/18 0514  TROPONINI <0.03 <0.03 <0.03   BNP Invalid input(s): POCBNP D-Dimer No results for input(s): DDIMER in the last 72 hours. Hemoglobin A1C Recent Labs    03/31/18 1803  HGBA1C 7.7*   Fasting Lipid Panel Recent Labs    04/01/18 0514  CHOL 107  HDL 45  LDLCALC 57  TRIG 26  CHOLHDL 2.4   Thyroid Function Tests No results for input(s): TSH, T4TOTAL, T3FREE, THYROIDAB in the last 72 hours.  Invalid input(s): FREET3 _____________  Dg Chest Portable 1 View  Result Date: 03/31/2018 CLINICAL DATA:  Chest pain onset last night , history of heart disease, has had stents placed/diabetic/hx NSTEMI/htn/hx basal cell ca/hx heart cathcp EXAM: PORTABLE CHEST 1 VIEW COMPARISON:  None. FINDINGS: Normal mediastinum and cardiac silhouette. Normal pulmonary vasculature. No evidence of effusion,  infiltrate, or pneumothorax. No acute bony abnormality. IMPRESSION: No acute cardiopulmonary process. Electronically Signed   By: Suzy Bouchard M.D.   On: 03/31/2018 11:17   Disposition   Pt is being discharged home today in good condition.  Follow-up Plans & Appointments    Follow-up Information    Satira Sark, MD Follow up.   Specialty:  Cardiology Why:  our office will call you with a follow-up with Dr. Domenic Polite in 2 weeks Contact information: [place] [id number] [phone]            Discharge Medications   Allergies as of 04/01/2018      Reactions   Penicillins Other (See Comments)   Passed out as a child Has patient had a PCN reaction causing immediate rash, facial/tongue/throat swelling, SOB or lightheadedness with hypotension: Unk Has patient had a PCN reaction causing severe rash involving mucus membranes or skin necrosis: Unk Has patient had a PCN reaction that required hospitalization: Unk Has patient had a PCN reaction occurring within the last 10 years: No If all of the above answers are "NO", then may proceed with Cephalosporin use.   Contrast  Media [iodinated Diagnostic Agents] Other (See Comments)   Had "welts" on arm, and kidney issues after given dye in the past      Medication List    TAKE these medications   amLODipine 10 MG tablet Commonly known as:  NORVASC Take 1 tablet (10 mg total) by mouth daily.   aspirin 81 MG tablet Take 81 mg by mouth daily.   atorvastatin 80 MG tablet Commonly known as:  LIPITOR Take 80 mg by mouth at bedtime.   carvedilol 25 MG tablet Commonly known as:  COREG Take 1 tablet (25 mg total) by mouth 2 (two) times daily with a meal.   clopidogrel 75 MG tablet Commonly known as:  PLAVIX TAKE 1 TABLET BY MOUTH  DAILY   furosemide 20 MG tablet Commonly known as:  LASIX Take 1 tablet (20 mg total) by mouth every other day.   glimepiride 1 MG tablet Commonly known as:  AMARYL Take 1 mg by  mouth daily with breakfast.   isosorbide mononitrate 30 MG 24 hr tablet Commonly known as:  Imdur Take 1 tablet (30 mg total) by mouth daily.   ketoconazole 2 % cream Commonly known as:  NIZORAL Apply 1 application topically 2 (two) times daily as needed (apply to face as needed (uses during the winter)). For dry skin   lisinopril 40 MG tablet Commonly known as:  PRINIVIL,ZESTRIL Take 1 tablet (40 mg total) by mouth daily.   metFORMIN 1000 MG tablet Commonly known as:  GLUCOPHAGE Take 1 tablet (1,000 mg total) by mouth 2 (two) times daily. Start taking on:  April 03, 2018 What changed:  These instructions start on April 03, 2018. If you are unsure what to do until then, ask your doctor or other care provider.   nitroGLYCERIN 0.4 MG SL tablet Commonly known as:  NITROSTAT Place 1 tablet (0.4 mg total) under the tongue every 5 (five) minutes x 3 doses as needed for chest pain.   potassium chloride SA 20 MEQ tablet Commonly known as:  K-DUR,KLOR-CON Take 1 tablet (20 mEq total) by mouth daily.   psyllium 28 % packet Commonly known as:  Metamucil Smooth Texture Take 1 packet by mouth at bedtime.   ranitidine 150 MG tablet Commonly known as:  ZANTAC Take 1 tablet by mouth 2 (two) times daily as needed for heartburn.   tamsulosin 0.4 MG Caps capsule Commonly known as:  FLOMAX Take 0.4 mg by mouth 2 (two) times daily.        Acute coronary syndrome (MI, NSTEMI, STEMI, etc) this admission?: No.    Outstanding Labs/Studies   None   Duration of Discharge Encounter   Greater than 30 minutes including physician time.  Signed, Lyda Jester, PA-C 04/01/2018, 9:19 AM

## 2018-04-03 ENCOUNTER — Encounter (HOSPITAL_COMMUNITY): Payer: Self-pay | Admitting: Cardiovascular Disease

## 2018-04-20 ENCOUNTER — Telehealth: Payer: Self-pay | Admitting: *Deleted

## 2018-04-20 NOTE — Telephone Encounter (Signed)
   Primary Cardiologist:  Rozann Lesches, MD   Patient contacted.  History reviewed.  No symptoms to suggest any unstable cardiac conditions.  Based on discussion, with current pandemic situation, we will be postponing this appointment for Sean Villarreal with a plan to f/u 05/22/2018.  If symptoms change, he has been instructed to contact our office.   Stated he was doing pretty good right now & would like to avoid coming to office.          [name]

## 2018-04-26 ENCOUNTER — Ambulatory Visit: Payer: Medicare Other | Admitting: Cardiology

## 2018-05-08 ENCOUNTER — Telehealth: Payer: Self-pay

## 2018-05-08 ENCOUNTER — Telehealth: Payer: Self-pay | Admitting: Cardiology

## 2018-05-08 NOTE — Progress Notes (Signed)
Virtual Visit via Telephone Note   This visit type was conducted due to national recommendations for restrictions regarding the COVID-19 Pandemic (e.g. social distancing) in an effort to limit this patient's exposure and mitigate transmission in our community.  Due to his co-morbid illnesses, this patient is at least at moderate risk for complications without adequate follow up.  This format is felt to be most appropriate for this patient at this time.  The patient did not have access to video technology/had technical difficulties with video requiring transitioning to audio format only (telephone).  All issues noted in this document were discussed and addressed.  No physical exam could be performed with this format.  Please refer to the patient's chart for his  consent to telehealth for [facility].   Evaluation Performed:  Follow-up visit  Date:  05/09/2018   ID:  DONEL OSOWSKI, DOB 02/19/1940, MRN [id number]  Patient Location: Home  Provider Location: Home  PCP:  Rory Percy, MD  Cardiologist:  Rozann Lesches, MD  Chief Complaint:  Follow-up CAD and symptom control  History of Present Illness:    MERLIN GOLDEN is a 78 y.o. male who presents via audio/video conferencing for a telehealth visit today.  He presents for a post hospital visit.  I reviewed interval records since our last visit in January.  He was seen by Dr. Harl Bowie in consultation for chest discomfort in March resulting in transfer to [facility] and follow-up cardiac catheterization.  Procedure was performed by Dr. Claiborne Billings on March 6 and fortunately revealed patent stent sites within the LAD and circumflex with known occlusion of a very small ramus intermedius.  Medical therapy was recommended.  He tells me that he has been doing relatively well since discharge.  He has not required any nitroglycerin and states that he is taking his regular cardiac regimen without difficulty. We went over his medications which  are outlined below.  I talked with him about the findings of his heart catheterization and plan to continue observation at this time.  I also reviewed his lab work from March showing good LDL control.  The patient does not have symptoms concerning for COVID-19 infection (fever, chills, cough, or new shortness of breath).  He has been staying at home, practicing social distancing.  Only goes up to the grocery store occasionally.   Past Medical History:  Diagnosis Date  . Arthritis   . Basal cell carcinoma   . Coronary artery disease    DES to LAD and circumflex April 2014, PTCA ostial circumflex 03/2016 due to ISR, 9/18 PCI/DESx2 osital Lcx (ISR), p/mLCx   . Dyslipidemia   . Essential hypertension   . GERD (gastroesophageal reflux disease)   . Hematuria   . History of blood transfusion 09/2012  . History of kidney stones   . NSTEMI (non-ST elevated myocardial infarction) ([place]) 08/2012  . Type II diabetes mellitus ([place])    Past Surgical History:  Procedure Laterality Date  . BASAL CELL CARCINOMA EXCISION     "right cheek; both shoulders"  . CARDIAC CATHETERIZATION    . CATARACT EXTRACTION W/ INTRAOCULAR LENS  IMPLANT, BILATERAL Bilateral   . COLONOSCOPY N/A 03/19/2016   Procedure: COLONOSCOPY;  Surgeon: Rogene Houston, MD;  Location: [facility];  Service: Endoscopy;  Laterality: N/A;  9:15  . CORONARY BALLOON ANGIOPLASTY N/A 04/15/2016   Procedure: Coronary Balloon Angioplasty;  Surgeon: Peter M Martinique, MD;  Location: [place] CV LAB;  Service: Cardiovascular;  Laterality: N/A;  .  CORONARY STENT INTERVENTION N/A 10/11/2016   Procedure: CORONARY STENT INTERVENTION;  Surgeon: Jettie Booze, MD;  Location: [place] CV LAB;  Service: Cardiovascular;  Laterality: N/A;  . CYSTOSCOPY W/ URETEROSCOPY W/ LITHOTRIPSY  10/2012   Archie Endo 11/10/2012  . CYSTOSCOPY WITH STENT PLACEMENT  08/2012; 09/2012  . EYE SURGERY Left ~ 02/2016   "for crinkled lens"  . LEFT HEART CATH AND CORONARY  ANGIOGRAPHY N/A 04/15/2016   Procedure: Left Heart Cath and Coronary Angiography;  Surgeon: Peter M Martinique, MD;  Location: [place] CV LAB;  Service: Cardiovascular;  Laterality: N/A;  . LEFT HEART CATH AND CORONARY ANGIOGRAPHY N/A 10/11/2016   Procedure: LEFT HEART CATH AND CORONARY ANGIOGRAPHY;  Surgeon: Jettie Booze, MD;  Location: [place] CV LAB;  Service: Cardiovascular;  Laterality: N/A;  . LEFT HEART CATH AND CORONARY ANGIOGRAPHY N/A 03/31/2018   Procedure: LEFT HEART CATH AND CORONARY ANGIOGRAPHY;  Surgeon: Troy Sine, MD;  Location: [place] CV LAB;  Service: Cardiovascular;  Laterality: N/A;  . LEFT HEART CATHETERIZATION WITH CORONARY ANGIOGRAM N/A 05/05/2012   Procedure: LEFT HEART CATHETERIZATION WITH CORONARY ANGIOGRAM;  Surgeon: Burnell Blanks, MD;  Location: [facility] CATH LAB;  Service: Cardiovascular;  Laterality: N/A;  . LEFT HEART CATHETERIZATION WITH CORONARY ANGIOGRAM N/A 05/15/2012   Procedure: LEFT HEART CATHETERIZATION WITH CORONARY ANGIOGRAM;  Surgeon: Burnell Blanks, MD;  Location: [facility] CATH LAB;  Service: Cardiovascular;  Laterality: N/A;  . TONSILLECTOMY  1947     Current Meds  Medication Sig  . amLODipine (NORVASC) 10 MG tablet Take 1 tablet (10 mg total) by mouth daily.  [name] aspirin 81 MG tablet Take 81 mg by mouth daily.  [name] atorvastatin (LIPITOR) 80 MG tablet Take 80 mg by mouth at bedtime.  . carvedilol (COREG) 25 MG tablet Take 1 tablet (25 mg total) by mouth 2 (two) times daily with a meal.  . clopidogrel (PLAVIX) 75 MG tablet TAKE 1 TABLET BY MOUTH  DAILY (Patient taking differently: Take 75 mg by mouth daily. )  . furosemide (LASIX) 20 MG tablet Take 1 tablet (20 mg total) by mouth every other day.  [name] glimepiride (AMARYL) 1 MG tablet Take 1 mg by mouth daily with breakfast.   . isosorbide mononitrate (IMDUR) 30 MG 24 hr tablet Take 1 tablet (30 mg total) by mouth daily.  [name] ketoconazole (NIZORAL) 2 % cream Apply 1 application topically 2  (two) times daily as needed (apply to face as needed (uses during the winter)). For dry skin  . lisinopril (PRINIVIL,ZESTRIL) 40 MG tablet Take 1 tablet (40 mg total) by mouth daily.  . metFORMIN (GLUCOPHAGE) 1000 MG tablet Take 1 tablet (1,000 mg total) by mouth 2 (two) times daily.  . nitroGLYCERIN (NITROSTAT) 0.4 MG SL tablet Place 1 tablet (0.4 mg total) under the tongue every 5 (five) minutes x 3 doses as needed for chest pain.  . potassium chloride SA (K-DUR,KLOR-CON) 20 MEQ tablet Take 1 tablet (20 mEq total) by mouth daily.  . psyllium (METAMUCIL SMOOTH TEXTURE) 28 % packet Take 1 packet by mouth at bedtime.  . tamsulosin (FLOMAX) 0.4 MG CAPS capsule Take 0.4 mg by mouth 2 (two) times daily.      Allergies:   Penicillins and Contrast media [iodinated diagnostic agents]   Social History   Tobacco Use  . Smoking status: Former Smoker    Packs/day: 0.75    Years: 30.00    Pack years: 22.50    Types: Cigarettes  Start date: 01/25/1982    Last attempt to quit: 11/05/1988    Years since quitting: 29.5  . Smokeless tobacco: Never Used  Substance Use Topics  . Alcohol use: Yes    Alcohol/week: 0.0 standard drinks    Comment: 04/15/2016 "I might drink 2 beers/month"  . Drug use: No     Family Hx: The patient's family history includes Heart attack in his father; Heart disease in his father and mother; Heart failure in his mother. There is no history of Colon cancer.  ROS:   Please see the history of present illness.    All other systems reviewed and are negative.   Prior CV studies:   The following studies were reviewed today:  Cardiac catheterization 03/31/2018:  Prox RCA to Mid RCA lesion is 20% stenosed.  Ramus lesion is 100% stenosed.  Previously placed Ost Cx to Prox Cx stent (unknown type) is widely patent.  Previously placed Prox Cx to Mid Cx stent (unknown type) is widely patent.  Ost LAD lesion is 20% stenosed.  Previously placed Prox LAD stent (unknown type)  is widely patent.  Mid LAD lesion is 60% stenosed.   Preserved global LV function with an ejection fraction at least 55 to 60% without focal segmental wall motion abnormalities.  LVEDP 14 mm.  Widely patent proximal LAD stent with ostial 20% eccentric LAD narrowing and distal 60% stenosis.  There is a very small caliber high diagonal almost ramus intermediate like vessel which was previously noted to be occluded.  There is retrograde collateralization of this vessel via the distal LAD.  Widely patent proximal and mid left circumflex stents,.  Mild calcification of the RCA with 20% mid narrowing and a dominant RCA vessel.  RECOMMENDATION: Medical therapy.  Catheterization does not show significant interval change from his prior assessment.  Aggressive lipid-lowering therapy with target LDL less than 70.  The patient has been maintained on dual antiplatelet therapy.  Optimal blood pressure control with ideal blood pressure less than 120/80.  Labs/Other Tests and Data Reviewed:    EKG:  I personally reviewed the tracing from 04/03/2018 which showed normal sinus rhythm with nonspecific T wave changes.  Recent Labs: 03/31/2018: Hemoglobin 13.7; Platelets 178 04/01/2018: BUN 13; Creatinine, Ser 0.79; Potassium 3.7; Sodium 136   Recent Lipid Panel Lab Results  Component Value Date/Time   CHOL 107 04/01/2018 05:14 AM   TRIG 26 04/01/2018 05:14 AM   HDL 45 04/01/2018 05:14 AM   CHOLHDL 2.4 04/01/2018 05:14 AM   LDLCALC 57 04/01/2018 05:14 AM    Wt Readings from Last 3 Encounters:  05/09/18 218 lb (98.9 kg)  04/01/18 218 lb 0.6 oz (98.9 kg)  01/31/18 221 lb (100.2 kg)     Objective:    Vital Signs:  BP 132/65   Pulse 78   Ht 5\' 10"  (1.778 m)   Wt 218 lb (98.9 kg)   BMI 31.28 kg/m    Patient answers questions spontaneously.  Nonlabored breathing while speaking in full sentences.  ASSESSMENT & PLAN:    1.  Multivessel CAD with recent cardiac catheterization demonstrating patent  stent sites within the LAD and circumflex and chronic occlusion of a very small ramus intermedius that was managed medically.  He is symptomatically stable at this time without active angina we will continue with observation.  2.  Essential hypertension, no changes made to current regimen.  Systolic blood pressure in the 130s today.  3.  Mixed hyperlipidemia, continues on statin therapy.  Recent  LDL 57.  COVID-19 Education: The signs and symptoms of COVID-19 were discussed with the patient and how to seek care for testing (follow up with PCP or arrange E-visit).  The importance of social distancing was discussed today.  Time:   Today, I have spent systolic minutes with the patient with telehealth technology discussing the above problems.     Medication Adjustments/Labs and Tests Ordered: Current medicines are reviewed at length with the patient today.  Concerns regarding medicines are outlined above.  Tests Ordered: No orders of the defined types were placed in this encounter.   Medication Changes: No orders of the defined types were placed in this encounter.   Disposition:  Follow up 6 months in the [place] office  Signed, Rozann Lesches, MD  05/09/2018 2:01 PM    [facility]

## 2018-05-08 NOTE — Telephone Encounter (Signed)
CONSENT READ TO PATIENT.   YOUR CARDIOLOGY TEAM HAS ARRANGED FOR AN E-VISIT FOR YOUR APPOINTMENT - PLEASE REVIEW IMPORTANT INFORMATION BELOW SEVERAL DAYS PRIOR TO YOUR APPOINTMENT  Due to the recent COVID-19 pandemic, we are transitioning in-person office visits to tele-medicine visits in an effort to decrease unnecessary exposure to our patients and staff. Medicare and most insurances are covering these visits without a copay needed. We also encourage you to sign up for MyChart if you have not already done so. You will need a smartphone if possible. For patients that do not have this, we can still complete the visit using a regular telephone but do prefer a smartphone to enable video when possible. You may have a close family member that lives with you that can help. If possible, we also ask that you have a blood pressure cuff and scale at home to measure your blood pressure, heart rate and weight prior to your scheduled appointment. Patients with clinical needs that need an in-person evaluation and testing will still be able to come to the office if absolutely necessary. If you have any questions, feel free to call our office.    IF YOU HAVE A SMARTPHONE, PLEASE DOWNLOAD THE WEBEX APP TO YOUR SMARTPHONE  - If Apple, go to [employer] and type in WebEx in the search bar. [place] [employer], the blue/green circle. The app is free but as with any other app download, your phone may require you to verify saved payment information or Apple password. You do NOT have to create a WebEx account.  - If Android, go to [employer] and type in [employer] in the search bar. [place] [employer], the blue/green circle. The app is free but as with any other app download, your phone may require you to verify saved payment information or Android password. You do NOT have to create a WebEx account.  It is very helpful to have this downloaded before your visit.    2-3 DAYS BEFORE YOUR  APPOINTMENT  You will receive a telephone call from one of our [place] team members - your caller ID may say "Unknown caller." If this is a video visit, we will confirm that you have been able to download the WebEx app. We will remind you check your blood pressure, heart rate and weight prior to your scheduled appointment. If you have an Apple Watch or Kardia, please upload any pertinent ECG strips the day before or morning of your appointment to [facility]. Our staff will also make sure you have reviewed the consent and agree to move forward with your scheduled tele-health visit.     THE DAY OF YOUR APPOINTMENT  Approximately 15 minutes prior to your scheduled appointment, you will receive a telephone call from one of [place] team - your caller ID may say "Unknown caller."  Our staff will confirm medications, vital signs for the day and any symptoms you may be experiencing. Please have this information available prior to the time of visit start. It may also be helpful for you to have a pad of paper and pen handy for any instructions given during your visit. They will also walk you through joining the WebEx smartphone meeting if this is a video visit.    CONSENT FOR TELE-HEALTH VISIT - PLEASE REVIEW  I hereby voluntarily request, consent and authorize [facility] and its employed or contracted physicians, physician assistants, nurse practitioners or other licensed health care professionals (the Practitioner), to provide me with telemedicine  health care services (the Services") as deemed necessary by the treating Practitioner. I acknowledge and consent to receive the Services by the Practitioner via telemedicine. I understand that the telemedicine visit will involve communicating with the Practitioner through live audiovisual communication technology and the disclosure of certain medical information by electronic transmission. I acknowledge that I have been given the opportunity to request an  in-person assessment or other available alternative prior to the telemedicine visit and am voluntarily participating in the telemedicine visit.  I understand that I have the right to withhold or withdraw my consent to the use of telemedicine in the course of my care at any time, without affecting my right to future care or treatment, and that the Practitioner or I may terminate the telemedicine visit at any time. I understand that I have the right to inspect all information obtained and/or recorded in the course of the telemedicine visit and may receive copies of available information for a reasonable fee.  I understand that some of the potential risks of receiving the Services via telemedicine include:   Delay or interruption in medical evaluation due to technological equipment failure or disruption;  Information transmitted may not be sufficient (e.g. poor resolution of images) to allow for appropriate medical decision making by the Practitioner; and/or   In rare instances, security protocols could fail, causing a breach of personal health information.  Furthermore, I acknowledge that it is my responsibility to provide information about my medical history, conditions and care that is complete and accurate to the best of my ability. I acknowledge that Practitioner's advice, recommendations, and/or decision may be based on factors not within their control, such as incomplete or inaccurate data provided by me or distortions of diagnostic images or specimens that may result from electronic transmissions. I understand that the practice of medicine is not an exact science and that Practitioner makes no warranties or guarantees regarding treatment outcomes. I acknowledge that I will receive a copy of this consent concurrently upon execution via email to the email address I last provided but may also request a printed copy by calling the office of [place].    I understand that my insurance will be  billed for this visit.   I have read or had this consent read to me.  I understand the contents of this consent, which adequately explains the benefits and risks of the Services being provided via telemedicine.   I have been provided ample opportunity to ask questions regarding this consent and the Services and have had my questions answered to my satisfaction.  I give my informed consent for the services to be provided through the use of telemedicine in my medical care  By participating in this telemedicine visit I agree to the above.

## 2018-05-08 NOTE — Telephone Encounter (Signed)
Called pt to go over, no answer. Left message fo rpt to return call.

## 2018-05-09 ENCOUNTER — Encounter: Payer: Self-pay | Admitting: Cardiology

## 2018-05-09 ENCOUNTER — Telehealth (INDEPENDENT_AMBULATORY_CARE_PROVIDER_SITE_OTHER): Payer: Medicare Other | Admitting: Cardiology

## 2018-05-09 VITALS — BP 132/65 | HR 78 | Ht 70.0 in | Wt 218.0 lb

## 2018-05-09 DIAGNOSIS — E785 Hyperlipidemia, unspecified: Secondary | ICD-10-CM | POA: Diagnosis not present

## 2018-05-09 DIAGNOSIS — I10 Essential (primary) hypertension: Secondary | ICD-10-CM

## 2018-05-09 DIAGNOSIS — I25119 Atherosclerotic heart disease of native coronary artery with unspecified angina pectoris: Secondary | ICD-10-CM | POA: Diagnosis not present

## 2018-05-09 NOTE — Patient Instructions (Addendum)
Medication Instructions: Your physician recommends that you continue on your current medications as directed. Please refer to the Current Medication list given to you today.   Labwork: None today  Procedures/Testing: None today  Follow-Up: 6 months with Dr.McDowell in the [place] office  Any Additional Special Instructions Will Be Listed Below (If Applicable).     If you need a refill on your cardiac medications before your next appointment, please call your pharmacy.  .   Thank you for choosing [facility] !

## 2018-05-22 ENCOUNTER — Ambulatory Visit: Payer: Medicare Other | Admitting: Cardiology

## 2018-06-12 DIAGNOSIS — L905 Scar conditions and fibrosis of skin: Secondary | ICD-10-CM | POA: Diagnosis not present

## 2018-06-12 DIAGNOSIS — L570 Actinic keratosis: Secondary | ICD-10-CM | POA: Diagnosis not present

## 2018-06-12 DIAGNOSIS — L820 Inflamed seborrheic keratosis: Secondary | ICD-10-CM | POA: Diagnosis not present

## 2018-06-12 DIAGNOSIS — L821 Other seborrheic keratosis: Secondary | ICD-10-CM | POA: Diagnosis not present

## 2018-06-12 DIAGNOSIS — Z85828 Personal history of other malignant neoplasm of skin: Secondary | ICD-10-CM | POA: Diagnosis not present

## 2018-06-12 DIAGNOSIS — D1801 Hemangioma of skin and subcutaneous tissue: Secondary | ICD-10-CM | POA: Diagnosis not present

## 2018-06-26 DIAGNOSIS — E119 Type 2 diabetes mellitus without complications: Secondary | ICD-10-CM | POA: Diagnosis not present

## 2018-06-26 DIAGNOSIS — M160 Bilateral primary osteoarthritis of hip: Secondary | ICD-10-CM | POA: Diagnosis not present

## 2018-06-26 DIAGNOSIS — I10 Essential (primary) hypertension: Secondary | ICD-10-CM | POA: Diagnosis not present

## 2018-08-20 ENCOUNTER — Other Ambulatory Visit: Payer: Self-pay | Admitting: Cardiology

## 2018-08-25 DIAGNOSIS — I10 Essential (primary) hypertension: Secondary | ICD-10-CM | POA: Diagnosis not present

## 2018-08-25 DIAGNOSIS — E1165 Type 2 diabetes mellitus with hyperglycemia: Secondary | ICD-10-CM | POA: Diagnosis not present

## 2018-09-12 DIAGNOSIS — D485 Neoplasm of uncertain behavior of skin: Secondary | ICD-10-CM | POA: Diagnosis not present

## 2018-09-12 DIAGNOSIS — L570 Actinic keratosis: Secondary | ICD-10-CM | POA: Diagnosis not present

## 2018-09-12 DIAGNOSIS — L821 Other seborrheic keratosis: Secondary | ICD-10-CM | POA: Diagnosis not present

## 2018-09-12 DIAGNOSIS — Z85828 Personal history of other malignant neoplasm of skin: Secondary | ICD-10-CM | POA: Diagnosis not present

## 2018-09-12 DIAGNOSIS — C44319 Basal cell carcinoma of skin of other parts of face: Secondary | ICD-10-CM | POA: Diagnosis not present

## 2018-09-12 DIAGNOSIS — D1801 Hemangioma of skin and subcutaneous tissue: Secondary | ICD-10-CM | POA: Diagnosis not present

## 2018-09-25 DIAGNOSIS — C44319 Basal cell carcinoma of skin of other parts of face: Secondary | ICD-10-CM | POA: Diagnosis not present

## 2018-11-01 DIAGNOSIS — Z23 Encounter for immunization: Secondary | ICD-10-CM | POA: Diagnosis not present

## 2018-11-14 DIAGNOSIS — L820 Inflamed seborrheic keratosis: Secondary | ICD-10-CM | POA: Diagnosis not present

## 2018-11-14 DIAGNOSIS — L821 Other seborrheic keratosis: Secondary | ICD-10-CM | POA: Diagnosis not present

## 2018-11-14 DIAGNOSIS — C44319 Basal cell carcinoma of skin of other parts of face: Secondary | ICD-10-CM | POA: Diagnosis not present

## 2018-11-14 DIAGNOSIS — Z79899 Other long term (current) drug therapy: Secondary | ICD-10-CM | POA: Diagnosis not present

## 2018-11-14 DIAGNOSIS — Z85828 Personal history of other malignant neoplasm of skin: Secondary | ICD-10-CM | POA: Diagnosis not present

## 2018-11-14 DIAGNOSIS — L570 Actinic keratosis: Secondary | ICD-10-CM | POA: Diagnosis not present

## 2018-11-20 ENCOUNTER — Other Ambulatory Visit: Payer: Self-pay

## 2018-11-20 ENCOUNTER — Encounter: Payer: Self-pay | Admitting: Cardiology

## 2018-11-20 ENCOUNTER — Ambulatory Visit (INDEPENDENT_AMBULATORY_CARE_PROVIDER_SITE_OTHER): Payer: Medicare Other | Admitting: Cardiology

## 2018-11-20 VITALS — BP 138/68 | HR 75 | Ht 69.0 in | Wt 221.0 lb

## 2018-11-20 DIAGNOSIS — R002 Palpitations: Secondary | ICD-10-CM

## 2018-11-20 DIAGNOSIS — I10 Essential (primary) hypertension: Secondary | ICD-10-CM | POA: Diagnosis not present

## 2018-11-20 DIAGNOSIS — E782 Mixed hyperlipidemia: Secondary | ICD-10-CM

## 2018-11-20 DIAGNOSIS — I25119 Atherosclerotic heart disease of native coronary artery with unspecified angina pectoris: Secondary | ICD-10-CM | POA: Diagnosis not present

## 2018-11-20 DIAGNOSIS — I200 Unstable angina: Secondary | ICD-10-CM | POA: Diagnosis not present

## 2018-11-20 NOTE — Patient Instructions (Signed)

## 2018-11-20 NOTE — Progress Notes (Signed)
Cardiology Office Note  Date: 11/20/2018   ID: Sean Villarreal, DOB [birth year], MRN [id number]  PCP:  Rory Percy, MD  Cardiologist:  Rozann Lesches, MD Electrophysiologist:  None   Chief Complaint  Patient presents with  . Cardiac follow-up    History of Present Illness: Sean Villarreal is a 78 y.o. male last assessed via telehealth encounter in April.  He is here for a routine follow-up visit.  Since last assessment he does not report any angina symptoms or nitroglycerin use.  He has had a few episodes where he felt like his heart was skipping, no dizziness or syncope.   Cardiac catheterization in March revealed patent stent sites within the LAD and circumflex with known occlusion of a very small ramus intermedius.  Medical therapy was recommended.  I reviewed his medications which are outlined below.  He is on a stable cardiac regimen.  His LDL was 57 earlier this year.  He has a physical with his PCP in December.  Past Medical History:  Diagnosis Date  . Arthritis   . Basal cell carcinoma   . Coronary artery disease    DES to LAD and circumflex April 2014, PTCA ostial circumflex 03/2016 due to ISR, 9/18 PCI/DESx2 osital Lcx (ISR), p/mLCx   . Dyslipidemia   . Essential hypertension   . GERD (gastroesophageal reflux disease)   . Hematuria   . History of blood transfusion 09/2012  . History of kidney stones   . NSTEMI (non-ST elevated myocardial infarction) ([place]) 08/2012  . Type II diabetes mellitus ([place])     Past Surgical History:  Procedure Laterality Date  . BASAL CELL CARCINOMA EXCISION     "right cheek; both shoulders"  . CARDIAC CATHETERIZATION    . CATARACT EXTRACTION W/ INTRAOCULAR LENS  IMPLANT, BILATERAL Bilateral   . COLONOSCOPY N/A 03/19/2016   Procedure: COLONOSCOPY;  Surgeon: Rogene Houston, MD;  [facility];  Service: Endoscopy;  Laterality: N/A;  9:15  . CORONARY BALLOON ANGIOPLASTY N/A 04/15/2016   Procedure: Coronary Balloon  Angioplasty;  Surgeon: Peter M Martinique, MD;  Location: [facility];  Service: Cardiovascular;  Laterality: N/A;  . CORONARY STENT INTERVENTION N/A 10/11/2016   Procedure: CORONARY STENT INTERVENTION;  Surgeon: Jettie Booze, MD;  Location: [facility];  Service: Cardiovascular;  Laterality: N/A;  . CYSTOSCOPY W/ URETEROSCOPY W/ LITHOTRIPSY  10/2012   Archie Endo 11/10/2012  . CYSTOSCOPY WITH STENT PLACEMENT  08/2012; 09/2012  . EYE SURGERY Left ~ 02/2016   "for crinkled lens"  . LEFT HEART CATH AND CORONARY ANGIOGRAPHY N/A 04/15/2016   Procedure: Left Heart Cath and Coronary Angiography;  Surgeon: Peter M Martinique, MD;  Location: [facility];  Service: Cardiovascular;  Laterality: N/A;  . LEFT HEART CATH AND CORONARY ANGIOGRAPHY N/A 10/11/2016   Procedure: LEFT HEART CATH AND CORONARY ANGIOGRAPHY;  Surgeon: Jettie Booze, MD;  Location: [facility];  Service: Cardiovascular;  Laterality: N/A;  . LEFT HEART CATH AND CORONARY ANGIOGRAPHY N/A 03/31/2018   Procedure: LEFT HEART CATH AND CORONARY ANGIOGRAPHY;  Surgeon: Troy Sine, MD;  Location: [facility];  Service: Cardiovascular;  Laterality: N/A;  . LEFT HEART CATHETERIZATION WITH CORONARY ANGIOGRAM N/A 05/05/2012   Procedure: LEFT HEART CATHETERIZATION WITH CORONARY ANGIOGRAM;  Surgeon: Burnell Blanks, MD;  Location: [facility];  Service: Cardiovascular;  Laterality: N/A;  . LEFT HEART CATHETERIZATION WITH CORONARY ANGIOGRAM N/A 05/15/2012   Procedure: LEFT HEART CATHETERIZATION  WITH CORONARY ANGIOGRAM;  Surgeon: Burnell Blanks, MD;  Location: [facility] CATH LAB;  Service: Cardiovascular;  Laterality: N/A;  . TONSILLECTOMY  1947    Current Outpatient Medications  Medication Sig Dispense Refill  . amLODipine (NORVASC) 10 MG tablet Take 1 tablet (10 mg total) by mouth daily. 30 tablet 3  . aspirin 81 MG tablet Take 81 mg by mouth daily.    [name] atorvastatin (LIPITOR) 80 MG tablet Take 80 mg by mouth  at bedtime.    . carvedilol (COREG) 25 MG tablet Take 1 tablet (25 mg total) by mouth 2 (two) times daily with a meal. 60 tablet 11  . clopidogrel (PLAVIX) 75 MG tablet TAKE 1 TABLET BY MOUTH  DAILY 90 tablet 3  . furosemide (LASIX) 20 MG tablet Take 1 tablet (20 mg total) by mouth every other day. 45 tablet 3  . glimepiride (AMARYL) 1 MG tablet Take 1 mg by mouth daily with breakfast.     . isosorbide mononitrate (IMDUR) 30 MG 24 hr tablet Take 1 tablet (30 mg total) by mouth daily. 30 tablet 11  . ketoconazole (NIZORAL) 2 % cream Apply 1 application topically 2 (two) times daily as needed (apply to face as needed (uses during the winter)). For dry skin    . lisinopril (PRINIVIL,ZESTRIL) 40 MG tablet Take 1 tablet (40 mg total) by mouth daily. 30 tablet 3  . metFORMIN (GLUCOPHAGE) 1000 MG tablet Take 1 tablet (1,000 mg total) by mouth 2 (two) times daily.    . nitroGLYCERIN (NITROSTAT) 0.4 MG SL tablet Place 1 tablet (0.4 mg total) under the tongue every 5 (five) minutes x 3 doses as needed for chest pain. 25 tablet 2  . potassium chloride SA (K-DUR,KLOR-CON) 20 MEQ tablet Take 1 tablet (20 mEq total) by mouth daily. 30 tablet 11  . psyllium (METAMUCIL SMOOTH TEXTURE) 28 % packet Take 1 packet by mouth at bedtime.    . tamsulosin (FLOMAX) 0.4 MG CAPS capsule Take 0.4 mg by mouth 2 (two) times daily.      No current facility-administered medications for this visit.    Allergies:  Penicillins and Contrast media [iodinated diagnostic agents]   Social History: The patient  reports that he quit smoking about 30 years ago. His smoking use included cigarettes. He started smoking about 36 years ago. He has a 22.50 pack-year smoking history. He has never used smokeless tobacco. He reports current alcohol use. He reports that he does not use drugs.   ROS:  Please see the history of present illness. Otherwise, complete review of systems is positive for removal of skin lesion from his neck.  All other  systems are reviewed and negative.   Physical Exam: VS:  BP 138/68   Pulse 75   Ht 5\' 9"  (1.753 m)   Wt 221 lb (100.2 kg)   SpO2 98%   BMI 32.64 kg/m , BMI Body mass index is 32.64 kg/m.  Wt Readings from Last 3 Encounters:  11/20/18 221 lb (100.2 kg)  05/09/18 218 lb (98.9 kg)  04/01/18 218 lb 0.6 oz (98.9 kg)    General: Elderly male, appears comfortable at rest. HEENT: Conjunctiva and lids normal, wearing a mask. Neck: Supple, no elevated JVP or carotid bruits, no thyromegaly. Lungs: Clear to auscultation, nonlabored breathing at rest. Cardiac: Regular rate and rhythm, no S3, soft systolic murmur. Abdomen: Soft, nontender, bowel sounds present. Extremities: No pitting edema, distal pulses 2+. Skin: Warm and dry. Musculoskeletal: No kyphosis. Neuropsychiatric: Alert and  oriented x3, affect grossly appropriate.  ECG:  An ECG dated 04/03/2018 was personally reviewed today and demonstrated:  Sinus rhythm with nonspecific T wave changes.  Recent Labwork: 03/31/2018: Hemoglobin 13.7; Platelets 178 04/01/2018: BUN 13; Creatinine, Ser 0.79; Potassium 3.7; Sodium 136     Component Value Date/Time   CHOL 107 04/01/2018 0514   TRIG 26 04/01/2018 0514   HDL 45 04/01/2018 0514   CHOLHDL 2.4 04/01/2018 0514   VLDL 5 04/01/2018 0514   LDLCALC 57 04/01/2018 0514    Other Studies Reviewed Today:  Cardiac catheterization 03/31/2018:  Prox RCA to Mid RCA lesion is 20% stenosed.  Ramus lesion is 100% stenosed.  Previously placed Ost Cx to Prox Cx stent (unknown type) is widely patent.  Previously placed Prox Cx to Mid Cx stent (unknown type) is widely patent.  Ost LAD lesion is 20% stenosed.  Previously placed Prox LAD stent (unknown type) is widely patent.  Mid LAD lesion is 60% stenosed.  Preserved global LV function with an ejection fraction at least 55 to 60% without focal segmental wall motion abnormalities. LVEDP 14 mm.  Widely patent proximal LAD stent with ostial 20%  eccentric LAD narrowing and distal 60% stenosis. There is a very small caliber high diagonal almost ramus intermediate like vessel which was previously noted to be occluded. There is retrograde collateralization of this vessel via the distal LAD.  Widely patent proximal and mid left circumflex stents,.  Mild calcification of the RCA with 20% mid narrowing and a dominant RCA vessel.  RECOMMENDATION: Medical therapy. Catheterization does not show significant interval change from his prior assessment. Aggressive lipid-lowering therapy with target LDL less than 70. The patient has been maintained on dual antiplatelet therapy. Optimal blood pressure control with ideal blood pressure less than 120/80.  Assessment and Plan:  1.  Multivessel CAD status post DES x2 to the ostial circumflex in September 2018.  Follow-up angiography earlier this year demonstrated patent stent sites and otherwise plan for medical therapy to address residual disease.  He is clinically stable at this point we will continue with present regimen.  2.  Mixed hyperlipidemia, he continues on Lipitor with last LDL 57.  He has a physical with [facility] later this year.  3.  Essential hypertension, systolic blood pressures in the 130s today.  No changes made to present regimen.  4.  Intermittent palpitations as described above, no dizziness or syncope.  I have asked him to keep an eye on symptoms, if they escalate we will plan to place a Zio patch.  Medication Adjustments/Labs and Tests Ordered: Current medicines are reviewed at length with the patient today.  Concerns regarding medicines are outlined above.   Tests Ordered: No orders of the defined types were placed in this encounter.   Medication Changes: No orders of the defined types were placed in this encounter.   Disposition:  Follow up 6 months in the [place] office.  Signed, Satira Sark, MD, [facility] 11/20/2018 3:39 PM    [facility] [place], [place], [place] [address] Phone: [phone]; Fax: [phone]

## 2018-11-21 DIAGNOSIS — C4441 Basal cell carcinoma of skin of scalp and neck: Secondary | ICD-10-CM | POA: Diagnosis not present

## 2018-11-24 DIAGNOSIS — I10 Essential (primary) hypertension: Secondary | ICD-10-CM | POA: Diagnosis not present

## 2018-11-24 DIAGNOSIS — E782 Mixed hyperlipidemia: Secondary | ICD-10-CM | POA: Diagnosis not present

## 2018-12-25 DIAGNOSIS — I10 Essential (primary) hypertension: Secondary | ICD-10-CM | POA: Diagnosis not present

## 2018-12-25 DIAGNOSIS — E872 Acidosis: Secondary | ICD-10-CM | POA: Diagnosis not present

## 2018-12-27 DIAGNOSIS — Z471 Aftercare following joint replacement surgery: Secondary | ICD-10-CM | POA: Diagnosis not present

## 2018-12-27 DIAGNOSIS — Z96641 Presence of right artificial hip joint: Secondary | ICD-10-CM | POA: Diagnosis not present

## 2018-12-27 DIAGNOSIS — M461 Sacroiliitis, not elsewhere classified: Secondary | ICD-10-CM | POA: Diagnosis not present

## 2018-12-27 DIAGNOSIS — M160 Bilateral primary osteoarthritis of hip: Secondary | ICD-10-CM | POA: Diagnosis not present

## 2019-01-04 DIAGNOSIS — E1165 Type 2 diabetes mellitus with hyperglycemia: Secondary | ICD-10-CM | POA: Diagnosis not present

## 2019-01-04 DIAGNOSIS — E7800 Pure hypercholesterolemia, unspecified: Secondary | ICD-10-CM | POA: Diagnosis not present

## 2019-01-04 DIAGNOSIS — I10 Essential (primary) hypertension: Secondary | ICD-10-CM | POA: Diagnosis not present

## 2019-02-23 DIAGNOSIS — E7801 Familial hypercholesterolemia: Secondary | ICD-10-CM | POA: Diagnosis not present

## 2019-02-23 DIAGNOSIS — I10 Essential (primary) hypertension: Secondary | ICD-10-CM | POA: Diagnosis not present

## 2019-03-09 DIAGNOSIS — Z23 Encounter for immunization: Secondary | ICD-10-CM | POA: Diagnosis not present

## 2019-03-23 DIAGNOSIS — E7849 Other hyperlipidemia: Secondary | ICD-10-CM | POA: Diagnosis not present

## 2019-03-23 DIAGNOSIS — I10 Essential (primary) hypertension: Secondary | ICD-10-CM | POA: Diagnosis not present

## 2019-03-26 ENCOUNTER — Encounter: Payer: Self-pay | Admitting: *Deleted

## 2019-03-26 DIAGNOSIS — N433 Hydrocele, unspecified: Secondary | ICD-10-CM | POA: Diagnosis not present

## 2019-03-26 DIAGNOSIS — N50811 Right testicular pain: Secondary | ICD-10-CM | POA: Diagnosis not present

## 2019-03-26 DIAGNOSIS — Z6832 Body mass index (BMI) 32.0-32.9, adult: Secondary | ICD-10-CM | POA: Diagnosis not present

## 2019-04-06 DIAGNOSIS — Z23 Encounter for immunization: Secondary | ICD-10-CM | POA: Diagnosis not present

## 2019-04-25 DIAGNOSIS — E7849 Other hyperlipidemia: Secondary | ICD-10-CM | POA: Diagnosis not present

## 2019-04-25 DIAGNOSIS — I10 Essential (primary) hypertension: Secondary | ICD-10-CM | POA: Diagnosis not present

## 2019-04-26 DIAGNOSIS — N430 Encysted hydrocele: Secondary | ICD-10-CM | POA: Diagnosis not present

## 2019-04-26 DIAGNOSIS — R350 Frequency of micturition: Secondary | ICD-10-CM | POA: Diagnosis not present

## 2019-04-26 DIAGNOSIS — R351 Nocturia: Secondary | ICD-10-CM | POA: Diagnosis not present

## 2019-04-26 DIAGNOSIS — N50811 Right testicular pain: Secondary | ICD-10-CM | POA: Diagnosis not present

## 2019-05-16 DIAGNOSIS — L570 Actinic keratosis: Secondary | ICD-10-CM | POA: Diagnosis not present

## 2019-05-16 DIAGNOSIS — Z85828 Personal history of other malignant neoplasm of skin: Secondary | ICD-10-CM | POA: Diagnosis not present

## 2019-05-16 DIAGNOSIS — L821 Other seborrheic keratosis: Secondary | ICD-10-CM | POA: Diagnosis not present

## 2019-05-16 DIAGNOSIS — L905 Scar conditions and fibrosis of skin: Secondary | ICD-10-CM | POA: Diagnosis not present

## 2019-05-16 DIAGNOSIS — L219 Seborrheic dermatitis, unspecified: Secondary | ICD-10-CM | POA: Diagnosis not present

## 2019-06-06 NOTE — Progress Notes (Signed)
Cardiology Office Note  Date: 06/07/2019   ID: Sean Villarreal, DOB 1941-01-08, MRN [id number]  PCP:  Rory Percy, MD  Cardiologist:  Rozann Lesches, MD Electrophysiologist:  None   Chief Complaint  Patient presents with  . Cardiac follow-up    History of Present Illness: Sean Villarreal is a 79 y.o. male last seen in October 2020.  He presents for a routine visit.  States that he has been doing reasonably well overall.  He does have dyspnea on exertion particularly with inclines, but make sure that he walks regularly for exercise.  He gets out for about a mile each day with his dog.  He does not report any increasing angina symptoms or nitroglycerin use.  I reviewed his cardiac medications, regimen outlined below.  He does not report any intolerances.  Refill being provided for fresh bottle of nitroglycerin.  I reviewed his lab work from December 2020 per PCP as outlined below.  Last LDL was 46 on statin therapy.  Last echocardiogram was in 2018 at which point he had mild aortic stenosis.  We discussed obtaining a follow-up evaluation.  I personally reviewed his ECG today which shows normal sinus rhythm.  Past Medical History:  Diagnosis Date  . Arthritis   . Basal cell carcinoma   . Coronary artery disease    DES to LAD and circumflex April 2014, PTCA ostial circumflex 03/2016 due to ISR, 9/18 PCI/DESx2 osital Lcx (ISR), p/mLCx   . Dyslipidemia   . Essential hypertension   . GERD (gastroesophageal reflux disease)   . Hematuria   . History of blood transfusion 09/2012  . History of kidney stones   . NSTEMI (non-ST elevated myocardial infarction) ([place]) 08/2012  . Type II diabetes mellitus ([place])     Past Surgical History:  Procedure Laterality Date  . BASAL CELL CARCINOMA EXCISION     "right cheek; both shoulders"  . CARDIAC CATHETERIZATION    . CATARACT EXTRACTION W/ INTRAOCULAR LENS  IMPLANT, BILATERAL Bilateral   . COLONOSCOPY N/A 03/19/2016   Procedure:  COLONOSCOPY;  Surgeon: Rogene Houston, MD;  [facility];  Service: Endoscopy;  Laterality: N/A;  9:15  . CORONARY BALLOON ANGIOPLASTY N/A 04/15/2016   Procedure: Coronary Balloon Angioplasty;  Surgeon: Peter M Martinique, MD;  Location: [place] [facility];  Service: Cardiovascular;  Laterality: N/A;  . CORONARY STENT INTERVENTION N/A 10/11/2016   Procedure: CORONARY STENT INTERVENTION;  Surgeon: Jettie Booze, MD;  Location: [place] [facility];  Service: Cardiovascular;  Laterality: N/A;  . CYSTOSCOPY W/ URETEROSCOPY W/ LITHOTRIPSY  10/2012   Archie Endo 11/10/2012  . CYSTOSCOPY WITH STENT PLACEMENT  08/2012; 09/2012  . EYE SURGERY Left ~ 02/2016   "for crinkled lens"  . LEFT HEART CATH AND CORONARY ANGIOGRAPHY N/A 04/15/2016   Procedure: Left Heart Cath and Coronary Angiography;  Surgeon: Peter M Martinique, MD;  Location: [facility];  Service: Cardiovascular;  Laterality: N/A;  . LEFT HEART CATH AND CORONARY ANGIOGRAPHY N/A 10/11/2016   Procedure: LEFT HEART CATH AND CORONARY ANGIOGRAPHY;  Surgeon: Jettie Booze, MD;  Location: [facility];  Service: Cardiovascular;  Laterality: N/A;  . LEFT HEART CATH AND CORONARY ANGIOGRAPHY N/A 03/31/2018   Procedure: LEFT HEART CATH AND CORONARY ANGIOGRAPHY;  Surgeon: Troy Sine, MD;  Location: [facility];  Service: Cardiovascular;  Laterality: N/A;  . LEFT HEART CATHETERIZATION WITH CORONARY ANGIOGRAM N/A 05/05/2012   Procedure: LEFT HEART CATHETERIZATION WITH CORONARY ANGIOGRAM;  Surgeon: Burnell Blanks, MD;  Location: [facility];  Service: Cardiovascular;  Laterality: N/A;  . LEFT HEART CATHETERIZATION WITH CORONARY ANGIOGRAM N/A 05/15/2012   Procedure: LEFT HEART CATHETERIZATION WITH CORONARY ANGIOGRAM;  Surgeon: Burnell Blanks, MD;  Location: [facility] CATH LAB;  Service: Cardiovascular;  Laterality: N/A;  . TONSILLECTOMY  1948    Current Outpatient Medications  Medication Sig Dispense Refill  . amLODipine  (NORVASC) 10 MG tablet Take 1 tablet (10 mg total) by mouth daily. 30 tablet 3  . aspirin 81 MG tablet Take 81 mg by mouth daily.    [name] atorvastatin (LIPITOR) 80 MG tablet Take 80 mg by mouth at bedtime.    . carvedilol (COREG) 25 MG tablet Take 1 tablet (25 mg total) by mouth 2 (two) times daily with a meal. 60 tablet 11  . clopidogrel (PLAVIX) 75 MG tablet TAKE 1 TABLET BY MOUTH  DAILY 90 tablet 3  . furosemide (LASIX) 20 MG tablet Take 1 tablet (20 mg total) by mouth every other day. 45 tablet 3  . glimepiride (AMARYL) 1 MG tablet Take 1 mg by mouth daily with breakfast.     . isosorbide mononitrate (IMDUR) 30 MG 24 hr tablet Take 1 tablet (30 mg total) by mouth daily. 30 tablet 11  . ketoconazole (NIZORAL) 2 % cream Apply 1 application topically 2 (two) times daily as needed (apply to face as needed (uses during the winter)). For dry skin    . lisinopril (PRINIVIL,ZESTRIL) 40 MG tablet Take 1 tablet (40 mg total) by mouth daily. 30 tablet 3  . metFORMIN (GLUCOPHAGE) 1000 MG tablet Take 1 tablet (1,000 mg total) by mouth 2 (two) times daily.    . nitroGLYCERIN (NITROSTAT) 0.4 MG SL tablet Place 1 tablet (0.4 mg total) under the tongue every 5 (five) minutes x 3 doses as needed for chest pain. 25 tablet 3  . potassium chloride SA (K-DUR,KLOR-CON) 20 MEQ tablet Take 1 tablet (20 mEq total) by mouth daily. 30 tablet 11  . psyllium (METAMUCIL SMOOTH TEXTURE) 28 % packet Take 1 packet by mouth at bedtime.    . tamsulosin (FLOMAX) 0.4 MG CAPS capsule Take 0.4 mg by mouth 2 (two) times daily.      No current facility-administered medications for this visit.   Allergies:  Penicillins and Contrast media [iodinated diagnostic agents]  ROS:   No progressive palpitations or syncope.  Physical Exam: VS:  BP 138/80   Pulse 71   Ht 5\' 9"  (1.753 m)   Wt 225 lb (102.1 kg)   SpO2 96%   BMI 33.23 kg/m , BMI Body mass index is 33.23 kg/m.  Wt Readings from Last 3 Encounters:  06/07/19 225 lb (102.1 kg)   11/20/18 221 lb (100.2 kg)  05/09/18 218 lb (98.9 kg)    General:  Elderly male, appears comfortable at rest. HEENT: Conjunctiva and lids normal, wearing a mask. Neck: Supple, no elevated JVP or carotid bruits, no thyromegaly. Lungs: Clear to auscultation, nonlabored breathing at rest. Cardiac: Regular rate and rhythm, no S3, 99991111 systolic murmur, no pericardial rub. Abdomen: Soft, nontender, bowel sounds present. Extremities: No pitting edema, distal pulses 2+.  ECG:  An ECG dated 03/31/2018 was personally reviewed today and demonstrated:  Sinus rhythm.  Recent Labwork:  December 2020: BUN 12, creatinine 0.62, potassium 3.8, AST 16, ALT 21, hemoglobin 14.2, platelets 189, cholesterol 109, triglycerides 52, HDL 51, LDL 46, TSH 1.71, hemoglobin A1c 7.5%  Other Studies Reviewed Today:  Cardiac catheterization  03/31/2018:  Prox RCA to Mid RCA lesion is 20% stenosed.  Ramus lesion is 100% stenosed.  Previously placed Ost Cx to Prox Cx stent (unknown type) is widely patent.  Previously placed Prox Cx to Mid Cx stent (unknown type) is widely patent.  Ost LAD lesion is 20% stenosed.  Previously placed Prox LAD stent (unknown type) is widely patent.  Mid LAD lesion is 60% stenosed.  Preserved global LV function with an ejection fraction at least 55 to 60% without focal segmental wall motion abnormalities. LVEDP 14 mm.  Widely patent proximal LAD stent with ostial 20% eccentric LAD narrowing and distal 60% stenosis. There is a very small caliber high diagonal almost ramus intermediate like vessel which was previously noted to be occluded. There is retrograde collateralization of this vessel via the distal LAD.  Widely patent proximal and mid left circumflex stents,.  Mild calcification of the RCA with 20% mid narrowing and a dominant RCA vessel.  RECOMMENDATION: Medical therapy. Catheterization does not show significant interval change from his prior assessment. Aggressive  lipid-lowering therapy with target LDL less than 70. The patient has been maintained on dual antiplatelet therapy. Optimal blood pressure control with ideal blood pressure less than 120/80.  Assessment and Plan:  1.  Multivessel CAD status post DES x2 to the ostial circumflex in September 2018.  Follow-up cardiac catheterization from last year is reviewed above with plan for medical therapy.  He does have dyspnea on exertion particular with inclines, no accelerating angina or nitroglycerin use.  ECG is normal today.  Continue aspirin, Plavix, Norvasc, Coreg, Lipitor, Imdur, and lisinopril.  Refill provided for fresh bottle of nitroglycerin.  2.  History of aortic stenosis, last assessed in 2018 and mild at that time.  Cardiac murmur is more prominent.  We will obtain a follow-up echocardiogram.  3.  Mixed hyperlipidemia, continues on Lipitor with last LDL 46.  Medication Adjustments/Labs and Tests Ordered: Current medicines are reviewed at length with the patient today.  Concerns regarding medicines are outlined above.   Tests Ordered: Orders Placed This Encounter  Procedures  . EKG 12-Lead  . ECHOCARDIOGRAM COMPLETE    Medication Changes: Meds ordered this encounter  Medications  . nitroGLYCERIN (NITROSTAT) 0.4 MG SL tablet    Sig: Place 1 tablet (0.4 mg total) under the tongue every 5 (five) minutes x 3 doses as needed for chest pain.    Dispense:  25 tablet    Refill:  3    Disposition:  Follow up 6 months in the [place] office.  Signed, Satira Sark, MD, [facility] 06/07/2019 9:08 AM    [facility] [place], [place], [place] [address] Phone: [phone]; Fax: [phone]

## 2019-06-07 ENCOUNTER — Ambulatory Visit (INDEPENDENT_AMBULATORY_CARE_PROVIDER_SITE_OTHER): Payer: Medicare Other | Admitting: Cardiology

## 2019-06-07 ENCOUNTER — Encounter: Payer: Self-pay | Admitting: Cardiology

## 2019-06-07 ENCOUNTER — Other Ambulatory Visit: Payer: Self-pay

## 2019-06-07 VITALS — BP 138/80 | HR 71 | Ht 69.0 in | Wt 225.0 lb

## 2019-06-07 DIAGNOSIS — I350 Nonrheumatic aortic (valve) stenosis: Secondary | ICD-10-CM | POA: Diagnosis not present

## 2019-06-07 DIAGNOSIS — E782 Mixed hyperlipidemia: Secondary | ICD-10-CM

## 2019-06-07 DIAGNOSIS — I25119 Atherosclerotic heart disease of native coronary artery with unspecified angina pectoris: Secondary | ICD-10-CM

## 2019-06-07 DIAGNOSIS — I10 Essential (primary) hypertension: Secondary | ICD-10-CM | POA: Diagnosis not present

## 2019-06-07 MED ORDER — NITROGLYCERIN 0.4 MG SL SUBL: 0.4000 mg | SUBLINGUAL_TABLET | SUBLINGUAL | 3 refills | Status: DC | PRN

## 2019-06-07 NOTE — Patient Instructions (Signed)

## 2019-06-26 ENCOUNTER — Ambulatory Visit: Payer: Medicare Other | Admitting: Cardiology

## 2019-06-28 ENCOUNTER — Other Ambulatory Visit: Payer: Self-pay

## 2019-06-28 ENCOUNTER — Ambulatory Visit (INDEPENDENT_AMBULATORY_CARE_PROVIDER_SITE_OTHER): Payer: Medicare Other

## 2019-06-28 DIAGNOSIS — I350 Nonrheumatic aortic (valve) stenosis: Secondary | ICD-10-CM

## 2019-07-03 ENCOUNTER — Telehealth: Payer: Self-pay | Admitting: *Deleted

## 2019-07-03 NOTE — Telephone Encounter (Signed)
-----   Message from Satira Sark, MD sent at 06/28/2019  7:40 PM EDT ----- Results reviewed. Echocardiogram shows normal LVEF at 65-70% and still mild aortic stenosis. Keep current follow-up plan.

## 2019-07-03 NOTE — Telephone Encounter (Signed)
Patient informed. Copy sent to PCP °

## 2019-08-24 DIAGNOSIS — E1165 Type 2 diabetes mellitus with hyperglycemia: Secondary | ICD-10-CM | POA: Diagnosis not present

## 2019-08-24 DIAGNOSIS — Z7984 Long term (current) use of oral hypoglycemic drugs: Secondary | ICD-10-CM | POA: Diagnosis not present

## 2019-08-24 DIAGNOSIS — I10 Essential (primary) hypertension: Secondary | ICD-10-CM | POA: Diagnosis not present

## 2019-08-24 DIAGNOSIS — M160 Bilateral primary osteoarthritis of hip: Secondary | ICD-10-CM | POA: Diagnosis not present

## 2019-09-02 ENCOUNTER — Other Ambulatory Visit: Payer: Self-pay | Admitting: Cardiology

## 2019-09-25 DIAGNOSIS — M160 Bilateral primary osteoarthritis of hip: Secondary | ICD-10-CM | POA: Diagnosis not present

## 2019-09-25 DIAGNOSIS — E1165 Type 2 diabetes mellitus with hyperglycemia: Secondary | ICD-10-CM | POA: Diagnosis not present

## 2019-09-25 DIAGNOSIS — Z7984 Long term (current) use of oral hypoglycemic drugs: Secondary | ICD-10-CM | POA: Diagnosis not present

## 2019-09-25 DIAGNOSIS — I10 Essential (primary) hypertension: Secondary | ICD-10-CM | POA: Diagnosis not present

## 2019-11-08 DIAGNOSIS — Z23 Encounter for immunization: Secondary | ICD-10-CM | POA: Diagnosis not present

## 2019-11-13 DIAGNOSIS — L919 Hypertrophic disorder of the skin, unspecified: Secondary | ICD-10-CM | POA: Diagnosis not present

## 2019-11-13 DIAGNOSIS — L219 Seborrheic dermatitis, unspecified: Secondary | ICD-10-CM | POA: Diagnosis not present

## 2019-11-13 DIAGNOSIS — L821 Other seborrheic keratosis: Secondary | ICD-10-CM | POA: Diagnosis not present

## 2019-11-13 DIAGNOSIS — C44329 Squamous cell carcinoma of skin of other parts of face: Secondary | ICD-10-CM | POA: Diagnosis not present

## 2019-11-13 DIAGNOSIS — Z85828 Personal history of other malignant neoplasm of skin: Secondary | ICD-10-CM | POA: Diagnosis not present

## 2019-11-13 DIAGNOSIS — D485 Neoplasm of uncertain behavior of skin: Secondary | ICD-10-CM | POA: Diagnosis not present

## 2019-11-13 DIAGNOSIS — L905 Scar conditions and fibrosis of skin: Secondary | ICD-10-CM | POA: Diagnosis not present

## 2019-11-20 DIAGNOSIS — C44329 Squamous cell carcinoma of skin of other parts of face: Secondary | ICD-10-CM | POA: Diagnosis not present

## 2019-11-24 DIAGNOSIS — I10 Essential (primary) hypertension: Secondary | ICD-10-CM | POA: Diagnosis not present

## 2019-11-24 DIAGNOSIS — M160 Bilateral primary osteoarthritis of hip: Secondary | ICD-10-CM | POA: Diagnosis not present

## 2019-11-24 DIAGNOSIS — Z7984 Long term (current) use of oral hypoglycemic drugs: Secondary | ICD-10-CM | POA: Diagnosis not present

## 2019-11-24 DIAGNOSIS — E1165 Type 2 diabetes mellitus with hyperglycemia: Secondary | ICD-10-CM | POA: Diagnosis not present

## 2019-11-26 DIAGNOSIS — Z23 Encounter for immunization: Secondary | ICD-10-CM | POA: Diagnosis not present

## 2019-12-10 NOTE — Progress Notes (Signed)
Cardiology Office Note  Date: 12/11/2019   ID: ESTEPHAN GALLARDO, DOB [birth year], MRN [id number]  PCP:  Rory Percy, MD  Cardiologist:  Rozann Lesches, MD Electrophysiologist:  None   Chief Complaint  Patient presents with  . Cardiac follow-up    History of Present Illness: Sean Villarreal is a 79 y.o. male last seen in May.  He presents for a routine visit.  He states that he has been having recurrent angina symptoms, discomfort in his shoulder blades, not necessarily progressive however, and not requiring nitroglycerin.  He states that he has been compliant with his medications.  Still gets out and walks his dog regularly.  Follow-up echocardiogram in June revealed LVEF 65 to 70%, mild left atrial enlargement, mildly stenotic aortic valve with mean gradient 11 mmHg.  We discussed the results, unlikely that his aortic valve is contributing to any of the above symptoms.  We went over his medications, plan to increase Imdur to 30 mg twice daily.  His last cardiac catheterization in March 2020 is noted below.  Past Medical History:  Diagnosis Date  . Arthritis   . Basal cell carcinoma   . Coronary artery disease    DES to LAD and circumflex April 2014, PTCA ostial circumflex 03/2016 due to ISR, 9/18 PCI/DESx2 osital Lcx (ISR), p/mLCx   . Dyslipidemia   . Essential hypertension   . GERD (gastroesophageal reflux disease)   . Hematuria   . History of blood transfusion 09/2012  . History of kidney stones   . NSTEMI (non-ST elevated myocardial infarction) ([place]) 08/2012  . Type II diabetes mellitus ([place])     Past Surgical History:  Procedure Laterality Date  . BASAL CELL CARCINOMA EXCISION     "right cheek; both shoulders"  . CARDIAC CATHETERIZATION    . CATARACT EXTRACTION W/ INTRAOCULAR LENS  IMPLANT, BILATERAL Bilateral   . COLONOSCOPY N/A 03/19/2016   Procedure: COLONOSCOPY;  Surgeon: Rogene Houston, MD;  [facility];  Service: Endoscopy;  Laterality: N/A;   9:15  . CORONARY BALLOON ANGIOPLASTY N/A 04/15/2016   Procedure: Coronary Balloon Angioplasty;  Surgeon: Peter M Martinique, MD;  Location: [place] [facility];  Service: Cardiovascular;  Laterality: N/A;  . CORONARY STENT INTERVENTION N/A 10/11/2016   Procedure: CORONARY STENT INTERVENTION;  Surgeon: Jettie Booze, MD;  Location: [place] [facility];  Service: Cardiovascular;  Laterality: N/A;  . CYSTOSCOPY W/ URETEROSCOPY W/ LITHOTRIPSY  10/2012   Archie Endo 11/10/2012  . CYSTOSCOPY WITH STENT PLACEMENT  08/2012; 09/2012  . EYE SURGERY Left ~ 02/2016   "for crinkled lens"  . LEFT HEART CATH AND CORONARY ANGIOGRAPHY N/A 04/15/2016   Procedure: Left Heart Cath and Coronary Angiography;  Surgeon: Peter M Martinique, MD;  Location: [place] [facility];  Service: Cardiovascular;  Laterality: N/A;  . LEFT HEART CATH AND CORONARY ANGIOGRAPHY N/A 10/11/2016   Procedure: LEFT HEART CATH AND CORONARY ANGIOGRAPHY;  Surgeon: Jettie Booze, MD;  Location: [place] [facility];  Service: Cardiovascular;  Laterality: N/A;  . LEFT HEART CATH AND CORONARY ANGIOGRAPHY N/A 03/31/2018   Procedure: LEFT HEART CATH AND CORONARY ANGIOGRAPHY;  Surgeon: Troy Sine, MD;  Location: [place] [facility];  Service: Cardiovascular;  Laterality: N/A;  . LEFT HEART CATHETERIZATION WITH CORONARY ANGIOGRAM N/A 05/05/2012   Procedure: LEFT HEART CATHETERIZATION WITH CORONARY ANGIOGRAM;  Surgeon: Burnell Blanks, MD;  Location: [facility] CATH LAB;  Service: Cardiovascular;  Laterality: N/A;  . LEFT HEART CATHETERIZATION WITH CORONARY  ANGIOGRAM N/A 05/15/2012   Procedure: LEFT HEART CATHETERIZATION WITH CORONARY ANGIOGRAM;  Surgeon: Burnell Blanks, MD;  Location: [facility] CATH LAB;  Service: Cardiovascular;  Laterality: N/A;  . TONSILLECTOMY  1948    Current Outpatient Medications  Medication Sig Dispense Refill  . amLODipine (NORVASC) 10 MG tablet Take 1 tablet (10 mg total) by mouth daily. 30 tablet 3  . aspirin 81 MG tablet Take  81 mg by mouth daily.    [name] atorvastatin (LIPITOR) 80 MG tablet Take 80 mg by mouth at bedtime.    . carvedilol (COREG) 25 MG tablet Take 1 tablet (25 mg total) by mouth 2 (two) times daily with a meal. 60 tablet 11  . clopidogrel (PLAVIX) 75 MG tablet TAKE 1 TABLET BY MOUTH  DAILY 90 tablet 3  . furosemide (LASIX) 20 MG tablet Take 1 tablet (20 mg total) by mouth every other day. 45 tablet 3  . glimepiride (AMARYL) 1 MG tablet Take 1 mg by mouth daily with breakfast.     . isosorbide mononitrate (IMDUR) 30 MG 24 hr tablet Take 1 tablet (30 mg total) by mouth 2 (two) times daily. 180 tablet 3  . ketoconazole (NIZORAL) 2 % cream Apply 1 application topically 2 (two) times daily as needed (apply to face as needed (uses during the winter)). For dry skin    . lisinopril (PRINIVIL,ZESTRIL) 40 MG tablet Take 1 tablet (40 mg total) by mouth daily. 30 tablet 3  . metFORMIN (GLUCOPHAGE) 1000 MG tablet Take 1 tablet (1,000 mg total) by mouth 2 (two) times daily.    . nitroGLYCERIN (NITROSTAT) 0.4 MG SL tablet Place 1 tablet (0.4 mg total) under the tongue every 5 (five) minutes x 3 doses as needed for chest pain. 25 tablet 3  . potassium chloride SA (K-DUR,KLOR-CON) 20 MEQ tablet Take 1 tablet (20 mEq total) by mouth daily. 30 tablet 11  . psyllium (METAMUCIL SMOOTH TEXTURE) 28 % packet Take 1 packet by mouth at bedtime.    . tamsulosin (FLOMAX) 0.4 MG CAPS capsule Take 0.4 mg by mouth 2 (two) times daily.      No current facility-administered medications for this visit.   Allergies:  Penicillins and Contrast media [iodinated diagnostic agents]  ROS: No palpitations or syncope.  Physical Exam: VS:  BP 110/72   Pulse 73   Ht 5\' 10"  (1.778 m)   Wt 212 lb (96.2 kg)   SpO2 97%   BMI 30.42 kg/m , BMI Body mass index is 30.42 kg/m.  Wt Readings from Last 3 Encounters:  12/11/19 212 lb (96.2 kg)  06/07/19 225 lb (102.1 kg)  11/20/18 221 lb (100.2 kg)    General: Patient appears comfortable at  rest. HEENT: Conjunctiva and lids normal, wearing a mask. Neck: Supple, no elevated JVP or carotid bruits, no thyromegaly. Lungs: Clear to auscultation, nonlabored breathing at rest. Cardiac: Regular rate and rhythm, no S3, 9-4/1 systolic murmur, no pericardial rub. Extremities: No pitting edema.  ECG:  An ECG dated 06/07/2019 was personally reviewed today and demonstrated:  Normal sinus rhythm.  Recent Labwork:  December 2020: BUN 12, creatinine 0.62, potassium 3.8, AST 16, ALT 21, hemoglobin 14.2, platelets 189, cholesterol 109, triglycerides 52, HDL 51, LDL 46, TSH 1.71, hemoglobin A1c 7.5%  Other Studies Reviewed Today:  Cardiac catheterization 03/31/2018:  Prox RCA to Mid RCA lesion is 20% stenosed.  Ramus lesion is 100% stenosed.  Previously placed Ost Cx to Prox Cx stent (unknown type) is widely patent.  Previously  placed Prox Cx to Mid Cx stent (unknown type) is widely patent.  Ost LAD lesion is 20% stenosed.  Previously placed Prox LAD stent (unknown type) is widely patent.  Mid LAD lesion is 60% stenosed.  Preserved global LV function with an ejection fraction at least 55 to 60% without focal segmental wall motion abnormalities. LVEDP 14 mm.  Widely patent proximal LAD stent with ostial 20% eccentric LAD narrowing and distal 60% stenosis. There is a very small caliber high diagonal almost ramus intermediate like vessel which was previously noted to be occluded. There is retrograde collateralization of this vessel via the distal LAD.  Widely patent proximal and mid left circumflex stents,.  Mild calcification of the RCA with 20% mid narrowing and a dominant RCA vessel.  RECOMMENDATION: Medical therapy. Catheterization does not show significant interval change from his prior assessment. Aggressive lipid-lowering therapy with target LDL less than 70. The patient has been maintained on dual antiplatelet therapy. Optimal blood pressure control with ideal blood  pressure less than 120/80.  Echocardiogram 06/28/2019: 1. Left ventricular ejection fraction, by estimation, is 65 to 70%. The  left ventricle has normal function. The left ventricle has no regional  wall motion abnormalities. Left ventricular diastolic parameters are  indeterminate. Elevated left atrial  pressure.  2. Right ventricular systolic function is normal. The right ventricular  size is normal. There is normal pulmonary artery systolic pressure.  3. Left atrial size was mildly dilated.  4. The mitral valve is abnormal. Trivial mitral valve regurgitation. No  evidence of mitral stenosis.  5. The aortic valve is tricuspid. Aortic valve regurgitation is trivial.  Mild aortic valve stenosis. Aortic valve mean gradient measures 10.7  mmHg. Aortic valve peak gradient measures 18.2 mmHg. Aortic valve area, by  VTI measures 1.60 cm.  6. The inferior vena cava is normal in size with greater than 50%  respiratory variability, suggesting right atrial pressure of 3 mmHg.   Assessment and Plan:  1.  Multivessel CAD status post PCI to the LAD and circumflex distribution as outlined above, last assessed at cardiac catheterization in March 2020 with patent stent sites and plan for medical therapy.  He does report exertional angina symptoms at this time, not necessarily progressive however on requiring nitroglycerin.  Plan to increase Imdur to 30 mg twice daily.  Continue aspirin, Norvasc, Lipitor, Coreg, Plavix, and lisinopril otherwise.  If symptoms escalate we may need to consider a follow-up cardiac catheterization to reevaluate for revascularization options.  2.  Mild calcific aortic stenosis by follow-up echocardiogram in June, asymptomatic.  3.  Mixed hyperlipidemia, he remains on Lipitor and has had good LDL control over time.  He will be having follow-up lab work with PCP in December.  Medication Adjustments/Labs and Tests Ordered: Current medicines are reviewed at length with the  patient today.  Concerns regarding medicines are outlined above.   Tests Ordered: No orders of the defined types were placed in this encounter.   Medication Changes: Meds ordered this encounter  Medications  . isosorbide mononitrate (IMDUR) 30 MG 24 hr tablet    Sig: Take 1 tablet (30 mg total) by mouth 2 (two) times daily.    Dispense:  180 tablet    Refill:  3    12/11/2019 dose increase    Disposition:  Follow up 3 months in the [place] office.  Signed, Satira Sark, MD, [facility] 12/11/2019 11:32 AM    [facility] [place], [place], [place]  [place] Phone: [phone]; Fax: [phone]

## 2019-12-11 ENCOUNTER — Ambulatory Visit (INDEPENDENT_AMBULATORY_CARE_PROVIDER_SITE_OTHER): Payer: Medicare Other | Admitting: Cardiology

## 2019-12-11 ENCOUNTER — Encounter: Payer: Self-pay | Admitting: Cardiology

## 2019-12-11 VITALS — BP 110/72 | HR 73 | Ht 70.0 in | Wt 212.0 lb

## 2019-12-11 DIAGNOSIS — I350 Nonrheumatic aortic (valve) stenosis: Secondary | ICD-10-CM

## 2019-12-11 DIAGNOSIS — E782 Mixed hyperlipidemia: Secondary | ICD-10-CM | POA: Diagnosis not present

## 2019-12-11 DIAGNOSIS — I25119 Atherosclerotic heart disease of native coronary artery with unspecified angina pectoris: Secondary | ICD-10-CM | POA: Diagnosis not present

## 2019-12-11 MED ORDER — ISOSORBIDE MONONITRATE ER 30 MG PO TB24: 30.0000 mg | ORAL_TABLET | Freq: Two times a day (BID) | ORAL | 3 refills | Status: DC

## 2019-12-11 NOTE — Patient Instructions (Addendum)
Medication Instructions:    Your physician has recommended you make the following change in your medication:   Increase isosorbide mononitrate to 30 mg by mouth twice daily  Continue other medications the same  Labwork:  None  Testing/Procedures:  None  Follow-Up:  Your physician recommends that you schedule a follow-up appointment in: 3 months.  Any Other Special Instructions Will Be Listed Below (If Applicable).  If you need a refill on your cardiac medications before your next appointment, please call your pharmacy.

## 2019-12-26 DIAGNOSIS — Z9849 Cataract extraction status, unspecified eye: Secondary | ICD-10-CM | POA: Diagnosis not present

## 2019-12-26 DIAGNOSIS — H52223 Regular astigmatism, bilateral: Secondary | ICD-10-CM | POA: Diagnosis not present

## 2019-12-26 DIAGNOSIS — Z961 Presence of intraocular lens: Secondary | ICD-10-CM | POA: Diagnosis not present

## 2019-12-26 DIAGNOSIS — H5211 Myopia, right eye: Secondary | ICD-10-CM | POA: Diagnosis not present

## 2019-12-26 DIAGNOSIS — E119 Type 2 diabetes mellitus without complications: Secondary | ICD-10-CM | POA: Diagnosis not present

## 2019-12-26 DIAGNOSIS — H5202 Hypermetropia, left eye: Secondary | ICD-10-CM | POA: Diagnosis not present

## 2019-12-26 DIAGNOSIS — H524 Presbyopia: Secondary | ICD-10-CM | POA: Diagnosis not present

## 2019-12-27 DIAGNOSIS — M1612 Unilateral primary osteoarthritis, left hip: Secondary | ICD-10-CM | POA: Diagnosis not present

## 2019-12-27 DIAGNOSIS — M461 Sacroiliitis, not elsewhere classified: Secondary | ICD-10-CM | POA: Diagnosis not present

## 2019-12-27 DIAGNOSIS — M61551 Other ossification of muscle, right thigh: Secondary | ICD-10-CM | POA: Diagnosis not present

## 2019-12-27 DIAGNOSIS — Z471 Aftercare following joint replacement surgery: Secondary | ICD-10-CM | POA: Diagnosis not present

## 2019-12-27 DIAGNOSIS — Z96641 Presence of right artificial hip joint: Secondary | ICD-10-CM | POA: Diagnosis not present

## 2019-12-27 DIAGNOSIS — M85851 Other specified disorders of bone density and structure, right thigh: Secondary | ICD-10-CM | POA: Diagnosis not present

## 2020-01-01 ENCOUNTER — Other Ambulatory Visit: Payer: Self-pay | Admitting: Cardiology

## 2020-01-10 DIAGNOSIS — E1165 Type 2 diabetes mellitus with hyperglycemia: Secondary | ICD-10-CM | POA: Diagnosis not present

## 2020-01-10 DIAGNOSIS — E7800 Pure hypercholesterolemia, unspecified: Secondary | ICD-10-CM | POA: Diagnosis not present

## 2020-01-10 DIAGNOSIS — I10 Essential (primary) hypertension: Secondary | ICD-10-CM | POA: Diagnosis not present

## 2020-01-10 DIAGNOSIS — Z0100 Encounter for examination of eyes and vision without abnormal findings: Secondary | ICD-10-CM | POA: Diagnosis not present

## 2020-01-10 DIAGNOSIS — Z125 Encounter for screening for malignant neoplasm of prostate: Secondary | ICD-10-CM | POA: Diagnosis not present

## 2020-01-15 DIAGNOSIS — Z6832 Body mass index (BMI) 32.0-32.9, adult: Secondary | ICD-10-CM | POA: Diagnosis not present

## 2020-01-15 DIAGNOSIS — E1165 Type 2 diabetes mellitus with hyperglycemia: Secondary | ICD-10-CM | POA: Diagnosis not present

## 2020-01-15 DIAGNOSIS — M542 Cervicalgia: Secondary | ICD-10-CM | POA: Diagnosis not present

## 2020-01-15 DIAGNOSIS — I10 Essential (primary) hypertension: Secondary | ICD-10-CM | POA: Diagnosis not present

## 2020-01-15 DIAGNOSIS — Z0000 Encounter for general adult medical examination without abnormal findings: Secondary | ICD-10-CM | POA: Diagnosis not present

## 2020-01-20 ENCOUNTER — Encounter (HOSPITAL_COMMUNITY): Payer: Self-pay

## 2020-01-20 ENCOUNTER — Inpatient Hospital Stay (HOSPITAL_COMMUNITY)
Admission: EM | Admit: 2020-01-20 | Discharge: 2020-01-31 | DRG: 233 | Disposition: A | Discharge status: Home / Self Care | Payer: Medicare Other | Attending: Thoracic Surgery (Cardiothoracic Vascular Surgery) | Admitting: Thoracic Surgery (Cardiothoracic Vascular Surgery)

## 2020-01-20 ENCOUNTER — Emergency Department (HOSPITAL_COMMUNITY): Payer: Medicare Other

## 2020-01-20 ENCOUNTER — Other Ambulatory Visit: Payer: Self-pay

## 2020-01-20 DIAGNOSIS — K219 Gastro-esophageal reflux disease without esophagitis: Secondary | ICD-10-CM | POA: Diagnosis present

## 2020-01-20 DIAGNOSIS — I97190 Other postprocedural cardiac functional disturbances following cardiac surgery: Secondary | ICD-10-CM | POA: Diagnosis not present

## 2020-01-20 DIAGNOSIS — Z7982 Long term (current) use of aspirin: Secondary | ICD-10-CM

## 2020-01-20 DIAGNOSIS — Z91041 Radiographic dye allergy status: Secondary | ICD-10-CM

## 2020-01-20 DIAGNOSIS — D696 Thrombocytopenia, unspecified: Secondary | ICD-10-CM | POA: Diagnosis not present

## 2020-01-20 DIAGNOSIS — E119 Type 2 diabetes mellitus without complications: Secondary | ICD-10-CM

## 2020-01-20 DIAGNOSIS — E66811 Obesity, class 1: Secondary | ICD-10-CM | POA: Diagnosis present

## 2020-01-20 DIAGNOSIS — E785 Hyperlipidemia, unspecified: Secondary | ICD-10-CM | POA: Diagnosis present

## 2020-01-20 DIAGNOSIS — I454 Nonspecific intraventricular block: Secondary | ICD-10-CM | POA: Diagnosis not present

## 2020-01-20 DIAGNOSIS — Z87891 Personal history of nicotine dependence: Secondary | ICD-10-CM

## 2020-01-20 DIAGNOSIS — Z85828 Personal history of other malignant neoplasm of skin: Secondary | ICD-10-CM

## 2020-01-20 DIAGNOSIS — R079 Chest pain, unspecified: Secondary | ICD-10-CM | POA: Diagnosis present

## 2020-01-20 DIAGNOSIS — Z955 Presence of coronary angioplasty implant and graft: Secondary | ICD-10-CM

## 2020-01-20 DIAGNOSIS — T82855A Stenosis of coronary artery stent, initial encounter: Secondary | ICD-10-CM | POA: Diagnosis not present

## 2020-01-20 DIAGNOSIS — D62 Acute posthemorrhagic anemia: Secondary | ICD-10-CM | POA: Diagnosis not present

## 2020-01-20 DIAGNOSIS — I5033 Acute on chronic diastolic (congestive) heart failure: Secondary | ICD-10-CM | POA: Diagnosis not present

## 2020-01-20 DIAGNOSIS — I200 Unstable angina: Secondary | ICD-10-CM | POA: Diagnosis present

## 2020-01-20 DIAGNOSIS — Z880 Allergy status to penicillin: Secondary | ICD-10-CM

## 2020-01-20 DIAGNOSIS — Z7984 Long term (current) use of oral hypoglycemic drugs: Secondary | ICD-10-CM

## 2020-01-20 DIAGNOSIS — Z8249 Family history of ischemic heart disease and other diseases of the circulatory system: Secondary | ICD-10-CM

## 2020-01-20 DIAGNOSIS — I252 Old myocardial infarction: Secondary | ICD-10-CM

## 2020-01-20 DIAGNOSIS — Y838 Other surgical procedures as the cause of abnormal reaction of the patient, or of later complication, without mention of misadventure at the time of the procedure: Secondary | ICD-10-CM | POA: Diagnosis not present

## 2020-01-20 DIAGNOSIS — Y828 Other medical devices associated with adverse incidents: Secondary | ICD-10-CM | POA: Diagnosis present

## 2020-01-20 DIAGNOSIS — J90 Pleural effusion, not elsewhere classified: Secondary | ICD-10-CM

## 2020-01-20 DIAGNOSIS — Z7902 Long term (current) use of antithrombotics/antiplatelets: Secondary | ICD-10-CM

## 2020-01-20 DIAGNOSIS — I2510 Atherosclerotic heart disease of native coronary artery without angina pectoris: Secondary | ICD-10-CM | POA: Diagnosis present

## 2020-01-20 DIAGNOSIS — K5900 Constipation, unspecified: Secondary | ICD-10-CM | POA: Diagnosis not present

## 2020-01-20 DIAGNOSIS — I25110 Atherosclerotic heart disease of native coronary artery with unstable angina pectoris: Secondary | ICD-10-CM | POA: Diagnosis present

## 2020-01-20 DIAGNOSIS — Z6830 Body mass index (BMI) 30.0-30.9, adult: Secondary | ICD-10-CM

## 2020-01-20 DIAGNOSIS — I10 Essential (primary) hypertension: Secondary | ICD-10-CM | POA: Diagnosis present

## 2020-01-20 DIAGNOSIS — Z951 Presence of aortocoronary bypass graft: Secondary | ICD-10-CM

## 2020-01-20 DIAGNOSIS — Z20822 Contact with and (suspected) exposure to covid-19: Secondary | ICD-10-CM | POA: Diagnosis not present

## 2020-01-20 DIAGNOSIS — I350 Nonrheumatic aortic (valve) stenosis: Secondary | ICD-10-CM | POA: Diagnosis present

## 2020-01-20 DIAGNOSIS — M1990 Unspecified osteoarthritis, unspecified site: Secondary | ICD-10-CM | POA: Diagnosis present

## 2020-01-20 DIAGNOSIS — J9811 Atelectasis: Secondary | ICD-10-CM | POA: Diagnosis not present

## 2020-01-20 DIAGNOSIS — I110 Hypertensive heart disease with heart failure: Secondary | ICD-10-CM | POA: Diagnosis present

## 2020-01-20 DIAGNOSIS — I2584 Coronary atherosclerosis due to calcified coronary lesion: Secondary | ICD-10-CM | POA: Diagnosis present

## 2020-01-20 DIAGNOSIS — E669 Obesity, unspecified: Secondary | ICD-10-CM | POA: Diagnosis present

## 2020-01-20 DIAGNOSIS — Z87442 Personal history of urinary calculi: Secondary | ICD-10-CM

## 2020-01-20 DIAGNOSIS — Z79899 Other long term (current) drug therapy: Secondary | ICD-10-CM

## 2020-01-20 DIAGNOSIS — I480 Paroxysmal atrial fibrillation: Secondary | ICD-10-CM | POA: Diagnosis present

## 2020-01-20 LAB — TROPONIN I (HIGH SENSITIVITY)
Troponin I (High Sensitivity): 8 ng/L (ref <18)
Troponin I (High Sensitivity): 10 ng/L (ref <18)

## 2020-01-20 LAB — BASIC METABOLIC PANEL
Anion gap: 13 (ref 5–15)
BUN: 13 mg/dL (ref 8–23)
CO2: 27 mmol/L (ref 22–32)
Calcium: 9 mg/dL (ref 8.9–10.3)
Chloride: 101 mmol/L (ref 98–111)
Creatinine, Ser: 0.86 mg/dL (ref 0.61–1.24)
GFR, Estimated: >60 mL/min (ref >60)
Glucose, Bld: 161 mg/dL — ABNORMAL HIGH (ref 70–99)
Potassium: 3.5 mmol/L (ref 3.5–5.1)
Sodium: 141 mmol/L (ref 135–145)

## 2020-01-20 LAB — CBC
HCT: 45.1 % (ref 39.0–52.0)
Hemoglobin: 15.4 g/dL (ref 13.0–17.0)
MCH: 32.5 pg (ref 26.0–34.0)
MCHC: 34.1 g/dL (ref 30.0–36.0)
MCV: 95.1 fL (ref 80.0–100.0)
Platelets: 183 10*3/uL (ref 150–400)
RBC: 4.74 MIL/uL (ref 4.22–5.81)
RDW: 12.5 % (ref 11.5–15.5)
WBC: 8.8 10*3/uL (ref 4.0–10.5)
nRBC: 0 % (ref 0.0–0.2)

## 2020-01-20 LAB — RESP PANEL BY RT-PCR (FLU A&B, COVID) ARPGX2
Influenza A by PCR: NEGATIVE
Influenza B by PCR: NEGATIVE
SARS Coronavirus 2 by RT PCR: NEGATIVE

## 2020-01-20 MED ORDER — AMLODIPINE BESYLATE 10 MG PO TABS
10.0000 mg | ORAL_TABLET | Freq: Every day | ORAL | Status: DC
  Administered 2020-01-21 – 2020-01-23 (×3): 10 mg via ORAL
  Filled 2020-01-20: qty 2
  Filled 2020-01-20 (×2): qty 1

## 2020-01-20 MED ORDER — ASPIRIN 81 MG PO CHEW
81.0000 mg | CHEWABLE_TABLET | Freq: Every day | ORAL | Status: DC
Start: 2020-01-21 — End: 2020-01-21
  Administered 2020-01-21: 09:00:00 81 mg via ORAL
  Filled 2020-01-20 (×2): qty 1

## 2020-01-20 MED ORDER — FUROSEMIDE 40 MG PO TABS: 20.0000 mg | ORAL_TABLET | ORAL | Status: DC

## 2020-01-20 MED ORDER — METFORMIN HCL 500 MG PO TABS
1000.0000 mg | ORAL_TABLET | Freq: Two times a day (BID) | ORAL | Status: DC
  Filled 2020-01-20: qty 2

## 2020-01-20 MED ORDER — NITROGLYCERIN 0.4 MG SL SUBL: 0.4000 mg | SUBLINGUAL_TABLET | SUBLINGUAL | Status: DC | PRN

## 2020-01-20 MED ORDER — METFORMIN HCL 500 MG PO TABS: 1000.0000 mg | ORAL_TABLET | Freq: Two times a day (BID) | ORAL | Status: DC

## 2020-01-20 MED ORDER — ENOXAPARIN SODIUM 40 MG/0.4ML ~~LOC~~ SOLN
40.0000 mg | SUBCUTANEOUS | Status: DC
  Administered 2020-01-21: 40 mg via SUBCUTANEOUS
  Filled 2020-01-20: qty 0.4

## 2020-01-20 MED ORDER — ISOSORBIDE MONONITRATE ER 30 MG PO TB24
30.0000 mg | ORAL_TABLET | Freq: Two times a day (BID) | ORAL | Status: DC
  Administered 2020-01-21 – 2020-01-23 (×5): 30 mg via ORAL
  Filled 2020-01-20 (×10): qty 1

## 2020-01-20 MED ORDER — ENOXAPARIN SODIUM 40 MG/0.4ML ~~LOC~~ SOLN: 40.0000 mg | SUBCUTANEOUS | Status: DC

## 2020-01-20 MED ORDER — CARVEDILOL 25 MG PO TABS
25.0000 mg | ORAL_TABLET | Freq: Two times a day (BID) | ORAL | Status: DC
  Administered 2020-01-21 – 2020-01-23 (×6): 25 mg via ORAL
  Filled 2020-01-20 (×5): qty 1
  Filled 2020-01-20: qty 2

## 2020-01-20 MED ORDER — CLOPIDOGREL BISULFATE 75 MG PO TABS
75.0000 mg | ORAL_TABLET | Freq: Every day | ORAL | Status: DC
  Administered 2020-01-21: 09:00:00 75 mg via ORAL
  Filled 2020-01-20: qty 1

## 2020-01-20 MED ORDER — TAMSULOSIN HCL 0.4 MG PO CAPS
0.4000 mg | ORAL_CAPSULE | Freq: Two times a day (BID) | ORAL | Status: DC
  Administered 2020-01-21 – 2020-01-31 (×20): 0.4 mg via ORAL
  Filled 2020-01-20 (×20): qty 1

## 2020-01-20 MED ORDER — INSULIN ASPART 100 UNIT/ML ~~LOC~~ SOLN
0.0000 [IU] | Freq: Three times a day (TID) | SUBCUTANEOUS | Status: DC
  Administered 2020-01-21: 16:00:00 8 [IU] via SUBCUTANEOUS
  Administered 2020-01-21: 09:00:00 3 [IU] via SUBCUTANEOUS
  Administered 2020-01-22: 07:00:00 8 [IU] via SUBCUTANEOUS
  Administered 2020-01-22: 17:00:00 3 [IU] via SUBCUTANEOUS
  Administered 2020-01-22 – 2020-01-23 (×2): 5 [IU] via SUBCUTANEOUS
  Administered 2020-01-23 (×2): 3 [IU] via SUBCUTANEOUS

## 2020-01-20 MED ORDER — LISINOPRIL 10 MG PO TABS
40.0000 mg | ORAL_TABLET | Freq: Every day | ORAL | Status: DC
  Filled 2020-01-20: qty 4

## 2020-01-20 MED ORDER — GLIMEPIRIDE 1 MG PO TABS
1.0000 mg | ORAL_TABLET | Freq: Every day | ORAL | Status: DC
  Administered 2020-01-22 – 2020-01-23 (×2): 1 mg via ORAL
  Filled 2020-01-20 (×6): qty 1

## 2020-01-20 MED ORDER — ATORVASTATIN CALCIUM 80 MG PO TABS
80.0000 mg | ORAL_TABLET | Freq: Every day | ORAL | Status: DC
  Administered 2020-01-21 – 2020-01-30 (×10): 80 mg via ORAL
  Filled 2020-01-20 (×10): qty 1

## 2020-01-20 MED ORDER — FUROSEMIDE 20 MG PO TABS
20.0000 mg | ORAL_TABLET | ORAL | Status: DC
  Administered 2020-01-22: 08:00:00 20 mg via ORAL
  Filled 2020-01-20 (×2): qty 1

## 2020-01-20 MED ORDER — ACETAMINOPHEN 325 MG PO TABS: 650.0000 mg | ORAL_TABLET | ORAL | Status: DC | PRN

## 2020-01-20 MED ORDER — ONDANSETRON HCL 4 MG/2ML IJ SOLN: 4.0000 mg | Freq: Four times a day (QID) | INTRAMUSCULAR | Status: DC | PRN

## 2020-01-20 MED ORDER — ASPIRIN 81 MG PO CHEW
324.0000 mg | CHEWABLE_TABLET | Freq: Once | ORAL | Status: AC
  Administered 2020-01-20: 22:00:00 324 mg via ORAL
  Filled 2020-01-20: qty 4

## 2020-01-20 MED ORDER — POTASSIUM CHLORIDE CRYS ER 20 MEQ PO TBCR
20.0000 meq | EXTENDED_RELEASE_TABLET | Freq: Every day | ORAL | Status: DC
  Administered 2020-01-21 – 2020-01-23 (×3): 20 meq via ORAL
  Filled 2020-01-20 (×3): qty 1

## 2020-01-20 NOTE — H&P (Addendum)
History and Physical    Sean Villarreal KDT:[id number] DOB: 12-11-1940 DOA: 01/20/2020  PCP: Rory Percy, MD   Patient coming from: Home.  I have personally briefly reviewed patient's old medical records in [place]  Chief Complaint: Chest pain.  HPI: Sean Villarreal is a 79 y.o. male with medical history significant of osteoarthritis, basal cell carcinoma, CAD, history of NSTEMI, history of multiple stent placement, hyperlipidemia, essential hypertension, GERD, hematuria, history of urolithiasis, type II DM, class I obesity who is coming to the emergency department due to pressure-like chest pain radiated to his back between the shoulder blades associated with lightheadedness, but denies dyspnea, nausea, emesis, palpitations or diaphoresis since early in the morning. No specific worsening or relieving factors. He denies PND, orthopnea or pitting edema of the lower extremities. Fever, chills, sore throat, rhinorrhea. No wheezing or hemoptysis. Denies abdominal pain, diarrhea, constipation, melena or hematochezia. Has nocturia twice at night, but denies dysuria, frequency or hematuria. No polyuria, polydipsia, polyphagia or blurred vision.  ED Course: Initial vital signs were temperature 98.3 F, pulse 98, respiration 18, BP 183/90 mmHg and O2 sat 97% on room air. The patient was given at 324 mg chewable aspirin in the emergency department. Cardiology on-call was consulted who recommended transfer to [facility] if troponin became elevated or overnight observation with cardiology consult in the morning if troponin level was normal.  Work-up: CBC was normal. BMP showed a glucose of 161 mg/dL, but was otherwise normal. LFTs were unremarkable. EKG sinus rhythm, LVH with repolarization abnormality. Troponin x2 within normal limits. Magnesium was 1.2 mg/dL. His chest radiograph did not show any active disease.  Review of Systems: As per HPI otherwise all other systems reviewed and are  negative.  Past Medical History:  Diagnosis Date  . Arthritis   . Basal cell carcinoma   . Coronary artery disease    DES to LAD and circumflex April 2014, PTCA ostial circumflex 03/2016 due to ISR, 9/18 PCI/DESx2 osital Lcx (ISR), p/mLCx   . Dyslipidemia   . Essential hypertension   . GERD (gastroesophageal reflux disease)   . Hematuria   . History of blood transfusion 09/2012  . History of kidney stones   . NSTEMI (non-ST elevated myocardial infarction) ([place]) 08/2012  . Type II diabetes mellitus ([place])    Past Surgical History:  Procedure Laterality Date  . BASAL CELL CARCINOMA EXCISION     "right cheek; both shoulders"  . CARDIAC CATHETERIZATION    . CATARACT EXTRACTION W/ INTRAOCULAR LENS  IMPLANT, BILATERAL Bilateral   . COLONOSCOPY N/A 03/19/2016   Procedure: COLONOSCOPY;  Surgeon: Rogene Houston, MD;  [facility];  Service: Endoscopy;  Laterality: N/A;  9:15  . CORONARY BALLOON ANGIOPLASTY N/A 04/15/2016   Procedure: Coronary Balloon Angioplasty;  Surgeon: Peter M Martinique, MD;  Location: [place] CV LAB;  Service: Cardiovascular;  Laterality: N/A;  . CORONARY STENT INTERVENTION N/A 10/11/2016   Procedure: CORONARY STENT INTERVENTION;  Surgeon: Jettie Booze, MD;  Location: [place] CV LAB;  Service: Cardiovascular;  Laterality: N/A;  . CYSTOSCOPY W/ URETEROSCOPY W/ LITHOTRIPSY  10/2012   Archie Endo 11/10/2012  . CYSTOSCOPY WITH STENT PLACEMENT  08/2012; 09/2012  . EYE SURGERY Left ~ 02/2016   "for crinkled lens"  . LEFT HEART CATH AND CORONARY ANGIOGRAPHY N/A 04/15/2016   Procedure: Left Heart Cath and Coronary Angiography;  Surgeon: Peter M Martinique, MD;  Location: [place] CV LAB;  Service: Cardiovascular;  Laterality: N/A;  . LEFT  HEART CATH AND CORONARY ANGIOGRAPHY N/A 10/11/2016   Procedure: LEFT HEART CATH AND CORONARY ANGIOGRAPHY;  Surgeon: Jettie Booze, MD;  Location: [place] CV LAB;  Service: Cardiovascular;  Laterality: N/A;  . LEFT HEART  CATH AND CORONARY ANGIOGRAPHY N/A 03/31/2018   Procedure: LEFT HEART CATH AND CORONARY ANGIOGRAPHY;  Surgeon: Troy Sine, MD;  Location: [place] CV LAB;  Service: Cardiovascular;  Laterality: N/A;  . LEFT HEART CATHETERIZATION WITH CORONARY ANGIOGRAM N/A 05/05/2012   Procedure: LEFT HEART CATHETERIZATION WITH CORONARY ANGIOGRAM;  Surgeon: Burnell Blanks, MD;  Location: [facility] CATH LAB;  Service: Cardiovascular;  Laterality: N/A;  . LEFT HEART CATHETERIZATION WITH CORONARY ANGIOGRAM N/A 05/15/2012   Procedure: LEFT HEART CATHETERIZATION WITH CORONARY ANGIOGRAM;  Surgeon: Burnell Blanks, MD;  Location: [facility] CATH LAB;  Service: Cardiovascular;  Laterality: N/A;  . TONSILLECTOMY  1947   Social History  reports that he quit smoking about 31 years ago. His smoking use included cigarettes. He started smoking about 38 years ago. He has a 22.50 pack-year smoking history. He has never used smokeless tobacco. He reports previous alcohol use. He reports that he does not use drugs.  Allergies  Allergen Reactions  . Penicillins Other (See Comments)    Passed out as a child Has patient had a PCN reaction causing immediate rash, facial/tongue/throat swelling, SOB or lightheadedness with hypotension: Unk Has patient had a PCN reaction causing severe rash involving mucus membranes or skin necrosis: Unk Has patient had a PCN reaction that required hospitalization: Unk Has patient had a PCN reaction occurring within the last 10 years: No If all of the above answers are "NO", then may proceed with Cephalosporin use.   . Contrast Media [Iodinated Diagnostic Agents] Other (See Comments)    Had "welts" on arm, and kidney issues after given dye in the past   Family History  Problem Relation Age of Onset  . Heart disease Mother   . Heart failure Mother   . Heart disease Father   . Heart attack Father   . Colon cancer Neg Hx    Prior to Admission medications   Medication Sig Start Date End Date  Taking? Authorizing Provider  amLODipine (NORVASC) 10 MG tablet Take 1 tablet (10 mg total) by mouth daily. 06/13/12  Yes Serpe, Burna Forts, PA-C  aspirin 81 MG tablet Take 81 mg by mouth daily.   Yes [provider]  atorvastatin (LIPITOR) 80 MG tablet Take 80 mg by mouth at bedtime. 05/06/12  Yes Edmisten, Azzie Roup, PA-C  carvedilol (COREG) 25 MG tablet Take 1 tablet (25 mg total) by mouth 2 (two) times daily with a meal. 05/15/12  Yes Barrett, Evelene Croon, PA-C  clopidogrel (PLAVIX) 75 MG tablet TAKE 1 TABLET BY MOUTH  DAILY 09/03/19  Yes Satira Sark, MD  furosemide (LASIX) 20 MG tablet Take 1 tablet (20 mg total) by mouth every other day. 01/09/16  Yes Satira Sark, MD  glimepiride (AMARYL) 1 MG tablet Take 1 mg by mouth daily with breakfast.  03/03/12  Yes [provider]  isosorbide mononitrate (IMDUR) 30 MG 24 hr tablet Take 1 tablet (30 mg total) by mouth 2 (two) times daily. 12/11/19  Yes Satira Sark, MD  ketoconazole (NIZORAL) 2 % cream Apply 1 application topically 2 (two) times daily as needed (apply to face as needed (uses during the winter)). For dry skin   Yes [provider]  lisinopril (PRINIVIL,ZESTRIL) 40 MG tablet Take 1 tablet (40  mg total) by mouth daily. 05/15/12  Yes Barrett, Evelene Croon, PA-C  metFORMIN (GLUCOPHAGE) 1000 MG tablet Take 1 tablet (1,000 mg total) by mouth 2 (two) times daily. 04/03/18  Yes Simmons, Brittainy M, PA-C  nitroGLYCERIN (NITROSTAT) 0.4 MG SL tablet DISSOLVE 1 TABLET UNDER THE TONGUE EVERY 5 MINUTES AS  NEEDED FOR CHEST PAIN. MAX  OF 3 TABLETS IN 15 MINUTES. CALL 911 IF PAIN PERSISTS. Patient taking differently: Place 0.4 mg under the tongue every 5 (five) minutes as needed. 01/01/20  Yes Satira Sark, MD  potassium chloride SA (K-DUR,KLOR-CON) 20 MEQ tablet Take 1 tablet (20 mEq total) by mouth daily. 05/15/12  Yes Barrett, Evelene Croon, PA-C  psyllium (METAMUCIL SMOOTH TEXTURE) 28 % packet Take 1 packet by mouth at  bedtime. 03/19/16  Yes Rehman, Mechele Dawley, MD  tamsulosin (FLOMAX) 0.4 MG CAPS capsule Take 0.4 mg by mouth 2 (two) times daily.    Yes [provider]    Physical Exam: Vitals:   01/20/20 2018 01/20/20 2056 01/20/20 2130 01/20/20 2200  BP:  (!) 157/86 (!) 149/77 (!) 155/89  Pulse:  85 83 84  Resp:  16 14 19   Temp:      TempSrc:      SpO2:  99% 98% 98%  Weight: 97.1 kg     Height: 5\' 8"  (1.727 m)       Constitutional: NAD, calm, comfortable Eyes: PERRL, lids and conjunctivae normal ENMT: Mucous membranes are moist. Posterior pharynx clear of any exudate or lesions. Neck: normal, supple, no masses, no thyromegaly Respiratory: clear to auscultation bilaterally, no wheezing, no crackles. Normal respiratory effort. No accessory muscle use.  Cardiovascular: Regular rate and rhythm, 2/6 SEM, no rubs / gallops. No extremity edema. 2+ pedal pulses. No carotid bruits.  Abdomen: Obese, bowel sounds positive. Soft, no tenderness, no masses palpated. No hepatosplenomegaly. Musculoskeletal: no clubbing / cyanosis. Good ROM, no contractures. Normal muscle tone.  Skin: no significant rashes, lesions, ulcers on limited dermatological summation. Neurologic: CN 2-12 grossly intact. Sensation intact, DTR normal. Strength 5/5 in all 4.  Psychiatric: Normal judgment and insight. Alert and oriented x 3. Normal mood.   Labs on Admission: I have personally reviewed following labs and imaging studies  CBC: Recent Labs  Lab 01/20/20 2045  WBC 8.8  HGB 15.4  HCT 45.1  MCV 95.1  PLT [id number]    Basic Metabolic Panel: Recent Labs  Lab 01/20/20 2045  NA 141  K 3.5  CL 101  CO2 27  GLUCOSE 161*  BUN 13  CREATININE 0.86  CALCIUM 9.0    GFR: Estimated Creatinine Clearance: 80 mL/min (by C-G formula based on SCr of 0.86 mg/dL).  Liver Function Tests: No results for input(s): AST, ALT, ALKPHOS, BILITOT, PROT, ALBUMIN in the last 168 hours.  Urine analysis: No results found for:  COLORURINE, APPEARANCEUR, [place], [place], GLUCOSEU, [place], [place], [place], [place], [place], NITRITE, LEUKOCYTESUR  Radiological Exams on Admission: DG Chest Port 1 View  Result Date: 01/20/2020 CLINICAL DATA:  Chest pain EXAM: PORTABLE CHEST 1 VIEW COMPARISON:  March 31, 2018 FINDINGS: The heart size and mediastinal contours are within normal limits. Both lungs are clear. The visualized skeletal structures are unremarkable. Aortic calcifications are noted. IMPRESSION: No active disease. Electronically Signed   By: Constance Holster M.D.   On: 01/20/2020 22:13   06/28/2019 echocardiogram  IMPRESSIONS:  1. Left ventricular ejection fraction, by estimation, is 65 to 70%. The  left ventricle has normal function. The left ventricle has  no regional  wall motion abnormalities. Left ventricular diastolic parameters are  indeterminate. Elevated left atrial  pressure.  2. Right ventricular systolic function is normal. The right ventricular  size is normal. There is normal pulmonary artery systolic pressure.  3. Left atrial size was mildly dilated.  4. The mitral valve is abnormal. Trivial mitral valve regurgitation. No  evidence of mitral stenosis.  5. The aortic valve is tricuspid. Aortic valve regurgitation is trivial.  Mild aortic valve stenosis. Aortic valve mean gradient measures 10.7  mmHg. Aortic valve peak gradient measures 18.2 mmHg. Aortic valve area, by  VTI measures 1.60 cm.  6. The inferior vena cava is normal in size with greater than 50%  respiratory variability, suggesting right atrial pressure of 3 mmHg.   EKG: Independently reviewed. Vent. rate 94 BPM PR interval 148 ms QRS duration 72 ms QT/QTc 356/445 ms P-R-T axes 64 5 0 Normal sinus rhythm Left ventricular hypertrophy with repolarization abnormality ( R in aVL ) Abnormal ECG  Assessment/Plan Principal Problem:   Chest pain Observation/telemetry. Supplemental oxygen as needed. SL NTG as  needed. Morphine as needed. Continue carvedilol. Continue aspirin. Consult cardiology in a.m.  Active Problems:   Coronary artery disease On aspirin, atorvastatin and carvedilol.   Hypertension Continue carvedilol 25 mg p.o. twice daily. Continue lisinopril 40 mg p.o. daily. Continue furosemide 20 mg p.o. daily. Monitor BP, HR, renal function electrolytes.    Hypomagnesemia  Replacing. Supplementation advised. Diet modification (edible green leaves)    Type II diabetes mellitus (HCC) Continue glimepiride 1 mg p.o. daily. CBG monitoring with RISS.    Dyslipidemia Continue atorvastatin 80 mg p.o. daily.     Class I obesity Lifestyle modifications recommended. Follow-up with PCP.    DVT prophylaxis: Lovenox SQ. Code Status:   Full code. Family Communication: Disposition Plan:   Patient is from:  Home.  Anticipated DC to:  Home.  Anticipated DC date:  01/21/2020.  Anticipated DC barriers: 01/21/2020.  Consults called: Admission status:  Observation/telemetry.   Severity of Illness: High due to precordial chest pain on the patient with a history of CAD/NSTEMI and no other risk factors for cardiovascular disease.  The patient will stay for cardiac monitoring and cardiology evaluation in AM.  Reubin Milan MD [facility]  How to contact the [facility] Attending or Consulting provider [place] or covering provider during after hours [place], for this patient?   1. Check the care team in [facility] and look for a) attending/consulting [facility] provider listed and b) the [facility] team listed 2. Log into [email or web address] and use [place] universal password to access. If you do not have the password, please contact the hospital operator. 3. Locate the [facility] provider you are looking for under [facility] and page to a number that you can be directly reached. 4. If you still have difficulty reaching the provider, please page the [facility] (Director on Call) for the Hospitalists listed on  amion for assistance.  01/20/2020, 11:29 PM   This document was prepared using Dragon voice recognition software and may contain some unintended transcription errors.

## 2020-01-20 NOTE — ED Provider Notes (Signed)
[facility] Emergency Department Provider Note MRN:  [id number]  Arrival date & time: 01/20/20     Chief Complaint   Chest Pain   History of Present Illness   Sean Villarreal is a 79 y.o. year-old male with a history of CAD status post multiple stent placement presenting to the ED with chief complaint of chest pain.  Pain is located in the center of the chest and he also has pain in his shoulder blades.  This is similar to prior chest pain that required stent placement in his heart.  Has been having this pain on and off for the past 1 to 2 weeks.  His cardiologist is trying to adjust his medications to help with this pain but the adjustments do not seem to be helping.  Today he experienced some lightheadedness with the pain and this worried him and so he is here for evaluation.  He denies shortness of breath, no headache or vision change, no nausea or vomiting, no abdominal pain, no leg pain or swelling.  No pain currently, pain was mild to moderate in severity.  Review of Systems  A complete 10 system review of systems was obtained and all systems are negative except as noted in the HPI and PMH.   Patient's Health History    Past Medical History:  Diagnosis Date  . Arthritis   . Basal cell carcinoma   . Coronary artery disease    DES to LAD and circumflex April 2014, PTCA ostial circumflex 03/2016 due to ISR, 9/18 PCI/DESx2 osital Lcx (ISR), p/mLCx   . Dyslipidemia   . Essential hypertension   . GERD (gastroesophageal reflux disease)   . Hematuria   . History of blood transfusion 09/2012  . History of kidney stones   . NSTEMI (non-ST elevated myocardial infarction) ([place]) 08/2012  . Type II diabetes mellitus ([place])     Past Surgical History:  Procedure Laterality Date  . BASAL CELL CARCINOMA EXCISION     "right cheek; both shoulders"  . CARDIAC CATHETERIZATION    . CATARACT EXTRACTION W/ INTRAOCULAR LENS  IMPLANT, BILATERAL Bilateral   . COLONOSCOPY  N/A 03/19/2016   Procedure: COLONOSCOPY;  Surgeon: Rogene Houston, MD;  [facility];  Service: Endoscopy;  Laterality: N/A;  9:15  . CORONARY BALLOON ANGIOPLASTY N/A 04/15/2016   Procedure: Coronary Balloon Angioplasty;  Surgeon: Peter M Martinique, MD;  Location: [place] CV LAB;  Service: Cardiovascular;  Laterality: N/A;  . CORONARY STENT INTERVENTION N/A 10/11/2016   Procedure: CORONARY STENT INTERVENTION;  Surgeon: Jettie Booze, MD;  Location: [place] CV LAB;  Service: Cardiovascular;  Laterality: N/A;  . CYSTOSCOPY W/ URETEROSCOPY W/ LITHOTRIPSY  10/2012   Archie Endo 11/10/2012  . CYSTOSCOPY WITH STENT PLACEMENT  08/2012; 09/2012  . EYE SURGERY Left ~ 02/2016   "for crinkled lens"  . LEFT HEART CATH AND CORONARY ANGIOGRAPHY N/A 04/15/2016   Procedure: Left Heart Cath and Coronary Angiography;  Surgeon: Peter M Martinique, MD;  Location: [place] CV LAB;  Service: Cardiovascular;  Laterality: N/A;  . LEFT HEART CATH AND CORONARY ANGIOGRAPHY N/A 10/11/2016   Procedure: LEFT HEART CATH AND CORONARY ANGIOGRAPHY;  Surgeon: Jettie Booze, MD;  Location: [place] CV LAB;  Service: Cardiovascular;  Laterality: N/A;  . LEFT HEART CATH AND CORONARY ANGIOGRAPHY N/A 03/31/2018   Procedure: LEFT HEART CATH AND CORONARY ANGIOGRAPHY;  Surgeon: Troy Sine, MD;  Location: [place] CV LAB;  Service: Cardiovascular;  Laterality: N/A;  .  LEFT HEART CATHETERIZATION WITH CORONARY ANGIOGRAM N/A 05/05/2012   Procedure: LEFT HEART CATHETERIZATION WITH CORONARY ANGIOGRAM;  Surgeon: Burnell Blanks, MD;  Location: [facility] CATH LAB;  Service: Cardiovascular;  Laterality: N/A;  . LEFT HEART CATHETERIZATION WITH CORONARY ANGIOGRAM N/A 05/15/2012   Procedure: LEFT HEART CATHETERIZATION WITH CORONARY ANGIOGRAM;  Surgeon: Burnell Blanks, MD;  Location: [facility] CATH LAB;  Service: Cardiovascular;  Laterality: N/A;  . TONSILLECTOMY  1948    Family History  Problem Relation Age of Onset  .  Heart disease Mother   . Heart failure Mother   . Heart disease Father   . Heart attack Father   . Colon cancer Neg Hx     Social History   Socioeconomic History  . Marital status: Married    Spouse name: Not on file  . Number of children: Not on file  . Years of education: Not on file  . Highest education level: Not on file  Occupational History  . Not on file  Tobacco Use  . Smoking status: Former Smoker    Packs/day: 0.75    Years: 30.00    Pack years: 22.50    Types: Cigarettes    Start date: 01/25/1982    Quit date: 11/05/1988    Years since quitting: 31.2  . Smokeless tobacco: Never Used  Vaping Use  . Vaping Use: Never used  Substance and Sexual Activity  . Alcohol use: Not Currently    Alcohol/week: 0.0 standard drinks  . Drug use: No  . Sexual activity: Not Currently  Other Topics Concern  . Not on file  Social History Narrative  . Not on file   Social Determinants of Health   Financial Resource Strain: Not on file  Food Insecurity: Not on file  Transportation Needs: Not on file  Physical Activity: Not on file  Stress: Not on file  Social Connections: Not on file  Intimate Partner Violence: Not on file     Physical Exam   Vitals:   01/20/20 2130 01/20/20 2200  BP: (!) 149/77 (!) 155/89  Pulse: 83 84  Resp: 14 19  Temp:    SpO2: 98% 98%    CONSTITUTIONAL: Well-appearing, NAD NEURO:  Alert and oriented x 3, no focal deficits EYES:  eyes equal and reactive ENT/NECK:  no LAD, no JVD CARDIO: Regular rate, well-perfused, normal S1 and S2 PULM:  CTAB no wheezing or rhonchi GI/GU:  normal bowel sounds, non-distended, non-tender MSK/SPINE:  No gross deformities, no edema SKIN:  no rash, atraumatic PSYCH:  Appropriate speech and behavior  *Additional and/or pertinent findings included in MDM below  Diagnostic and Interventional Summary    EKG Interpretation  Date/Time:  Sunday January 20 2020 20:04:29 EST Ventricular Rate:  94 PR  Interval:  148 QRS Duration: 72 QT Interval:  356 QTC Calculation: 445 R Axis:   5 Text Interpretation: Normal sinus rhythm Left ventricular hypertrophy with repolarization abnormality ( R in aVL ) Abnormal ECG Confirmed by Gerlene Fee [phone]) on 01/20/2020 9:45:51 PM      Labs Reviewed  BASIC METABOLIC PANEL - Abnormal; Notable for the following components:      Result Value   Glucose, Bld 161 (*)    All other components within normal limits  RESP PANEL BY RT-PCR (FLU A&B, COVID) ARPGX2  CBC  TROPONIN I (HIGH SENSITIVITY)  TROPONIN I (HIGH SENSITIVITY)    DG Chest Port 1 View  Final Result      Medications  aspirin chewable tablet 324  mg (324 mg Oral Given 01/20/20 2155)     Procedures  /  Critical Care Procedures  ED Course and Medical Decision Making  I have reviewed the triage vital signs, the nursing notes, and pertinent available records from the EMR.  Listed above are laboratory and imaging tests that I personally ordered, reviewed, and interpreted and then considered in my medical decision making (see below for details).  Extensive cardiac history, EKG with some potential ischemic changes but without STEMI criteria, patient is currently pain-free.  Awaiting troponin.  Anticipating admission.     Discussed case with cardiology on-call, given the patient is pain-free and troponin is negative, does not necessarily need cardiology admission.  Plan is to follow-up second troponin, if also negative can be admitted for observation by hospitalist here at [facility].  Signed out to oncoming provider at shift change.  Barth Kirks. Sedonia Small, [place] [email or web address]  Final Clinical Impressions(s) / ED Diagnoses     ICD-10-CM   1. Chest pain, unspecified type  R07.9     ED Discharge Orders    None       Discharge Instructions Discussed with and Provided to Patient:   Discharge Instructions   None        Maudie Flakes, MD 01/20/20 2245

## 2020-01-20 NOTE — ED Triage Notes (Signed)
Pt to er, pt states that he is here for chest pain, states that he had the pain this am, states that it hasn't gone away through out the day, states that he took some ntg with some relief. States that he called his heart doc and was told to come to the er.

## 2020-01-21 ENCOUNTER — Encounter (HOSPITAL_COMMUNITY)
Admission: EM | Disposition: A | Discharge status: Home / Self Care | Payer: Self-pay | Source: Home / Self Care | Attending: Internal Medicine

## 2020-01-21 ENCOUNTER — Inpatient Hospital Stay (HOSPITAL_COMMUNITY): Payer: Medicare Other

## 2020-01-21 DIAGNOSIS — I5031 Acute diastolic (congestive) heart failure: Secondary | ICD-10-CM | POA: Diagnosis not present

## 2020-01-21 DIAGNOSIS — E119 Type 2 diabetes mellitus without complications: Secondary | ICD-10-CM | POA: Diagnosis not present

## 2020-01-21 DIAGNOSIS — E785 Hyperlipidemia, unspecified: Secondary | ICD-10-CM

## 2020-01-21 DIAGNOSIS — T82855A Stenosis of coronary artery stent, initial encounter: Secondary | ICD-10-CM | POA: Diagnosis not present

## 2020-01-21 DIAGNOSIS — I25110 Atherosclerotic heart disease of native coronary artery with unstable angina pectoris: Secondary | ICD-10-CM | POA: Diagnosis not present

## 2020-01-21 DIAGNOSIS — I252 Old myocardial infarction: Secondary | ICD-10-CM | POA: Diagnosis not present

## 2020-01-21 DIAGNOSIS — Z87442 Personal history of urinary calculi: Secondary | ICD-10-CM | POA: Diagnosis not present

## 2020-01-21 DIAGNOSIS — E1159 Type 2 diabetes mellitus with other circulatory complications: Secondary | ICD-10-CM | POA: Diagnosis not present

## 2020-01-21 DIAGNOSIS — I214 Non-ST elevation (NSTEMI) myocardial infarction: Secondary | ICD-10-CM | POA: Diagnosis not present

## 2020-01-21 DIAGNOSIS — Z951 Presence of aortocoronary bypass graft: Secondary | ICD-10-CM | POA: Diagnosis not present

## 2020-01-21 DIAGNOSIS — I088 Other rheumatic multiple valve diseases: Secondary | ICD-10-CM | POA: Diagnosis not present

## 2020-01-21 DIAGNOSIS — E669 Obesity, unspecified: Secondary | ICD-10-CM | POA: Diagnosis present

## 2020-01-21 DIAGNOSIS — Y838 Other surgical procedures as the cause of abnormal reaction of the patient, or of later complication, without mention of misadventure at the time of the procedure: Secondary | ICD-10-CM | POA: Diagnosis not present

## 2020-01-21 DIAGNOSIS — I350 Nonrheumatic aortic (valve) stenosis: Secondary | ICD-10-CM | POA: Diagnosis present

## 2020-01-21 DIAGNOSIS — I97190 Other postprocedural cardiac functional disturbances following cardiac surgery: Secondary | ICD-10-CM | POA: Diagnosis not present

## 2020-01-21 DIAGNOSIS — Z87891 Personal history of nicotine dependence: Secondary | ICD-10-CM | POA: Diagnosis not present

## 2020-01-21 DIAGNOSIS — I5033 Acute on chronic diastolic (congestive) heart failure: Secondary | ICD-10-CM | POA: Diagnosis not present

## 2020-01-21 DIAGNOSIS — Z20822 Contact with and (suspected) exposure to covid-19: Secondary | ICD-10-CM | POA: Diagnosis not present

## 2020-01-21 DIAGNOSIS — J90 Pleural effusion, not elsewhere classified: Secondary | ICD-10-CM | POA: Diagnosis not present

## 2020-01-21 DIAGNOSIS — Z01810 Encounter for preprocedural cardiovascular examination: Secondary | ICD-10-CM | POA: Diagnosis not present

## 2020-01-21 DIAGNOSIS — D62 Acute posthemorrhagic anemia: Secondary | ICD-10-CM | POA: Diagnosis not present

## 2020-01-21 DIAGNOSIS — Z79899 Other long term (current) drug therapy: Secondary | ICD-10-CM | POA: Diagnosis not present

## 2020-01-21 DIAGNOSIS — Z7984 Long term (current) use of oral hypoglycemic drugs: Secondary | ICD-10-CM | POA: Diagnosis not present

## 2020-01-21 DIAGNOSIS — I200 Unstable angina: Secondary | ICD-10-CM | POA: Diagnosis present

## 2020-01-21 DIAGNOSIS — I480 Paroxysmal atrial fibrillation: Secondary | ICD-10-CM | POA: Diagnosis present

## 2020-01-21 DIAGNOSIS — Z955 Presence of coronary angioplasty implant and graft: Secondary | ICD-10-CM | POA: Diagnosis not present

## 2020-01-21 DIAGNOSIS — Z7982 Long term (current) use of aspirin: Secondary | ICD-10-CM | POA: Diagnosis not present

## 2020-01-21 DIAGNOSIS — K219 Gastro-esophageal reflux disease without esophagitis: Secondary | ICD-10-CM | POA: Diagnosis present

## 2020-01-21 DIAGNOSIS — I259 Chronic ischemic heart disease, unspecified: Secondary | ICD-10-CM | POA: Diagnosis not present

## 2020-01-21 DIAGNOSIS — I10 Essential (primary) hypertension: Secondary | ICD-10-CM

## 2020-01-21 DIAGNOSIS — Z7902 Long term (current) use of antithrombotics/antiplatelets: Secondary | ICD-10-CM | POA: Diagnosis not present

## 2020-01-21 DIAGNOSIS — E1169 Type 2 diabetes mellitus with other specified complication: Secondary | ICD-10-CM | POA: Diagnosis not present

## 2020-01-21 DIAGNOSIS — E66811 Obesity, class 1: Secondary | ICD-10-CM | POA: Diagnosis present

## 2020-01-21 DIAGNOSIS — J9811 Atelectasis: Secondary | ICD-10-CM | POA: Diagnosis not present

## 2020-01-21 DIAGNOSIS — M1990 Unspecified osteoarthritis, unspecified site: Secondary | ICD-10-CM | POA: Diagnosis present

## 2020-01-21 DIAGNOSIS — Y828 Other medical devices associated with adverse incidents: Secondary | ICD-10-CM | POA: Diagnosis present

## 2020-01-21 DIAGNOSIS — Z85828 Personal history of other malignant neoplasm of skin: Secondary | ICD-10-CM | POA: Diagnosis not present

## 2020-01-21 DIAGNOSIS — I2510 Atherosclerotic heart disease of native coronary artery without angina pectoris: Secondary | ICD-10-CM | POA: Diagnosis not present

## 2020-01-21 DIAGNOSIS — I110 Hypertensive heart disease with heart failure: Secondary | ICD-10-CM | POA: Diagnosis not present

## 2020-01-21 DIAGNOSIS — I25119 Atherosclerotic heart disease of native coronary artery with unspecified angina pectoris: Secondary | ICD-10-CM | POA: Diagnosis not present

## 2020-01-21 HISTORY — PX: LEFT HEART CATH AND CORONARY ANGIOGRAPHY: CATH118249

## 2020-01-21 LAB — ECHOCARDIOGRAM COMPLETE
AR max vel: 1.12 cm2
AV Area VTI: 1.35 cm2
AV Area mean vel: 1.1 cm2
AV Mean grad: 13 mmHg
AV Peak grad: 26.8 mmHg
Ao pk vel: 2.59 m/s
Area-P 1/2: 3.39 cm2
Height: 68 in
S' Lateral: 3.5 cm
Weight: 3421.54 oz

## 2020-01-21 LAB — HEPATIC FUNCTION PANEL
ALT: 23 U/L (ref 0–44)
AST: 20 U/L (ref 15–41)
Albumin: 4.3 g/dL (ref 3.5–5.0)
Alkaline Phosphatase: 47 U/L (ref 38–126)
Bilirubin, Direct: 0.1 mg/dL (ref 0.0–0.2)
Indirect Bilirubin: 0.6 mg/dL (ref 0.3–0.9)
Total Bilirubin: 0.7 mg/dL (ref 0.3–1.2)
Total Protein: 7.6 g/dL (ref 6.5–8.1)

## 2020-01-21 LAB — GLUCOSE, CAPILLARY
Glucose-Capillary: 254 mg/dL — ABNORMAL HIGH (ref 70–99)
Glucose-Capillary: 353 mg/dL — ABNORMAL HIGH (ref 70–99)

## 2020-01-21 LAB — HEMOGLOBIN A1C
Hgb A1c MFr Bld: 7.6 % — ABNORMAL HIGH (ref 4.8–5.6)
Mean Plasma Glucose: 171.42 mg/dL

## 2020-01-21 LAB — CBG MONITORING, ED: Glucose-Capillary: 184 mg/dL — ABNORMAL HIGH (ref 70–99)

## 2020-01-21 LAB — MAGNESIUM: Magnesium: 1.2 mg/dL — ABNORMAL LOW (ref 1.7–2.4)

## 2020-01-21 SURGERY — LEFT HEART CATH AND CORONARY ANGIOGRAPHY
Anesthesia: LOCAL

## 2020-01-21 MED ORDER — SODIUM CHLORIDE 0.9% FLUSH
3.0000 mL | Freq: Two times a day (BID) | INTRAVENOUS | Status: DC
  Administered 2020-01-22 – 2020-01-23 (×2): 3 mL via INTRAVENOUS

## 2020-01-21 MED ORDER — SODIUM CHLORIDE 0.9% FLUSH: 3.0000 mL | INTRAVENOUS | Status: DC | PRN

## 2020-01-21 MED ORDER — POTASSIUM CHLORIDE CRYS ER 20 MEQ PO TBCR
20.0000 meq | EXTENDED_RELEASE_TABLET | Freq: Once | ORAL | Status: AC
  Administered 2020-01-21: 03:00:00 20 meq via ORAL
  Filled 2020-01-21: qty 1

## 2020-01-21 MED ORDER — OXYCODONE HCL 5 MG PO TABS: 5.0000 mg | ORAL_TABLET | ORAL | Status: DC | PRN

## 2020-01-21 MED ORDER — ASPIRIN 81 MG PO CHEW: 81.0000 mg | CHEWABLE_TABLET | ORAL | Status: DC

## 2020-01-21 MED ORDER — DIPHENHYDRAMINE HCL 50 MG/ML IJ SOLN
INTRAMUSCULAR | Status: AC
  Filled 2020-01-21: qty 1

## 2020-01-21 MED ORDER — ASPIRIN 81 MG PO CHEW
81.0000 mg | CHEWABLE_TABLET | Freq: Every day | ORAL | Status: DC
  Administered 2020-01-22 – 2020-01-23 (×2): 81 mg via ORAL
  Filled 2020-01-21 (×3): qty 1

## 2020-01-21 MED ORDER — SODIUM CHLORIDE 0.9 % IV SOLN: 250.0000 mL | INTRAVENOUS | Status: DC | PRN

## 2020-01-21 MED ORDER — IOHEXOL 350 MG/ML SOLN
INTRAVENOUS | Status: DC | PRN
  Administered 2020-01-21: 15:00:00 81 mL

## 2020-01-21 MED ORDER — SODIUM CHLORIDE 0.9 % WEIGHT BASED INFUSION
3.0000 mL/kg/h | INTRAVENOUS | Status: DC
  Administered 2020-01-21: 09:00:00 3 mL/kg/h via INTRAVENOUS

## 2020-01-21 MED ORDER — MIDAZOLAM HCL 2 MG/2ML IJ SOLN
INTRAMUSCULAR | Status: AC
  Filled 2020-01-21: qty 2

## 2020-01-21 MED ORDER — LABETALOL HCL 5 MG/ML IV SOLN: 10.0000 mg | INTRAVENOUS | Status: AC | PRN

## 2020-01-21 MED ORDER — DIPHENHYDRAMINE HCL 50 MG/ML IJ SOLN
INTRAMUSCULAR | Status: DC | PRN
  Administered 2020-01-21: 50 mg via INTRAVENOUS

## 2020-01-21 MED ORDER — HEPARIN (PORCINE) IN NACL 1000-0.9 UT/500ML-% IV SOLN
INTRAVENOUS | Status: AC
  Filled 2020-01-21: qty 500

## 2020-01-21 MED ORDER — MAGNESIUM SULFATE 4 GM/100ML IV SOLN
4.0000 g | Freq: Once | INTRAVENOUS | Status: AC
  Administered 2020-01-21: 03:00:00 4 g via INTRAVENOUS
  Filled 2020-01-21: qty 100

## 2020-01-21 MED ORDER — POTASSIUM CHLORIDE CRYS ER 20 MEQ PO TBCR: 40.0000 meq | EXTENDED_RELEASE_TABLET | Freq: Once | ORAL | Status: DC

## 2020-01-21 MED ORDER — FENTANYL CITRATE (PF) 100 MCG/2ML IJ SOLN
INTRAMUSCULAR | Status: AC
  Filled 2020-01-21: qty 2

## 2020-01-21 MED ORDER — DIPHENHYDRAMINE HCL 25 MG PO CAPS: 50.0000 mg | ORAL_CAPSULE | Freq: Once | ORAL | Status: DC

## 2020-01-21 MED ORDER — SODIUM CHLORIDE 0.9 % IV SOLN: INTRAVENOUS | Status: AC

## 2020-01-21 MED ORDER — METHYLPREDNISOLONE SODIUM SUCC 40 MG IJ SOLR: 40.0000 mg | INTRAMUSCULAR | Status: DC

## 2020-01-21 MED ORDER — HEPARIN (PORCINE) IN NACL 1000-0.9 UT/500ML-% IV SOLN
INTRAVENOUS | Status: DC | PRN
  Administered 2020-01-21 (×2): 500 mL

## 2020-01-21 MED ORDER — DIPHENHYDRAMINE HCL 50 MG/ML IJ SOLN: 50.0000 mg | Freq: Once | INTRAMUSCULAR | Status: DC

## 2020-01-21 MED ORDER — ONDANSETRON HCL 4 MG/2ML IJ SOLN: 4.0000 mg | Freq: Four times a day (QID) | INTRAMUSCULAR | Status: DC | PRN

## 2020-01-21 MED ORDER — FENTANYL CITRATE (PF) 100 MCG/2ML IJ SOLN
INTRAMUSCULAR | Status: DC | PRN
  Administered 2020-01-21: 25 ug via INTRAVENOUS

## 2020-01-21 MED ORDER — HYDRALAZINE HCL 20 MG/ML IJ SOLN: 10.0000 mg | INTRAMUSCULAR | Status: AC | PRN

## 2020-01-21 MED ORDER — HEPARIN BOLUS VIA INFUSION
4000.0000 [IU] | Freq: Once | INTRAVENOUS | Status: AC
  Administered 2020-01-21: 09:00:00 4000 [IU] via INTRAVENOUS

## 2020-01-21 MED ORDER — ACETAMINOPHEN 325 MG PO TABS
650.0000 mg | ORAL_TABLET | ORAL | Status: DC | PRN
  Administered 2020-01-21 – 2020-01-23 (×3): 650 mg via ORAL
  Filled 2020-01-21 (×3): qty 2

## 2020-01-21 MED ORDER — HEPARIN (PORCINE) 25000 UT/250ML-% IV SOLN
1100.0000 [IU]/h | INTRAVENOUS | Status: DC
  Administered 2020-01-21: 09:00:00 1100 [IU]/h via INTRAVENOUS
  Filled 2020-01-21: qty 250

## 2020-01-21 MED ORDER — LIDOCAINE HCL (PF) 1 % IJ SOLN
INTRAMUSCULAR | Status: DC | PRN
  Administered 2020-01-21: 18 mL

## 2020-01-21 MED ORDER — LIDOCAINE HCL (PF) 1 % IJ SOLN
INTRAMUSCULAR | Status: AC
  Filled 2020-01-21: qty 30

## 2020-01-21 MED ORDER — METHYLPREDNISOLONE SODIUM SUCC 125 MG IJ SOLR
125.0000 mg | Freq: Once | INTRAMUSCULAR | Status: AC
  Administered 2020-01-21: 09:00:00 125 mg via INTRAVENOUS
  Filled 2020-01-21: qty 2

## 2020-01-21 MED ORDER — MIDAZOLAM HCL 2 MG/2ML IJ SOLN
INTRAMUSCULAR | Status: DC | PRN
  Administered 2020-01-21: 1 mg via INTRAVENOUS

## 2020-01-21 MED ORDER — HEPARIN (PORCINE) 25000 UT/250ML-% IV SOLN
1500.0000 [IU]/h | INTRAVENOUS | Status: DC
  Administered 2020-01-21: 22:00:00 1100 [IU]/h via INTRAVENOUS
  Administered 2020-01-22: 12:00:00 1300 [IU]/h via INTRAVENOUS
  Administered 2020-01-23 (×2): 1500 [IU]/h via INTRAVENOUS
  Filled 2020-01-21 (×5): qty 250

## 2020-01-21 MED ORDER — SODIUM CHLORIDE 0.9 % WEIGHT BASED INFUSION
1.0000 mL/kg/h | INTRAVENOUS | Status: DC
  Administered 2020-01-21: 09:00:00 1 mL/kg/h via INTRAVENOUS

## 2020-01-21 SURGICAL SUPPLY — 8 items
CATH INFINITI 5FR MULTPACK ANG (CATHETERS) ×2 IMPLANT
KIT HEART LEFT (KITS) ×2 IMPLANT
PACK CARDIAC CATHETERIZATION (CUSTOM PROCEDURE TRAY) ×2 IMPLANT
SHEATH PINNACLE 5F 10CM (SHEATH) ×2 IMPLANT
SHEATH PROBE COVER 6X72 (BAG) ×2 IMPLANT
TRANSDUCER W/STOPCOCK (MISCELLANEOUS) ×2 IMPLANT
TUBING CIL FLEX 10 FLL-RA (TUBING) ×2 IMPLANT
WIRE EMERALD 3MM-J .035X150CM (WIRE) ×2 IMPLANT

## 2020-01-21 NOTE — Consult Note (Addendum)
[address]       [place] [address]             [phone]        Patrik E [place] Medical Record [id number] Date of Birth: 1941-01-16  Referring: Dr. Belva Crome, MD Primary Care: Rory Percy, MD Primary Cardiologist:Samuel Domenic Polite, MD  Chief Complaint:    Chief Complaint  Patient presents with   Chest pressure/pain radiating to shoulder blades  Reason for consultation: Coronary artery disease  History of Present Illness:     This is a 79 year old male with a past medical history of coronary artery disease (s/p DES to LAD and LCx in 04/2012, PTCA to ostial LCx in 03/2016 due to ISR, DES to ostial LCx in 09/2016 due to ISR, patent stents by repeat cath in 03/2018), hypertension, hyperlipidemia, Type 2 diabetes mellitus, and mild AS who presented to [facility] ED on 01/20/2020 with complaints of chest pressure/pain radiating to  shoulder blades and dizziness. Patient denied nausea, emesis, LE edema, or diaphoresis. Patient states that this has happened mostly with exertion, but has occurred recently while at rest. He also has complaints of shortness of breath at times as well with the aforementioned symptoms. EKG showed normal sinus rhythm, LVH. Initial Troponin high sensitivity was 8. It was previously recommend by Dr. Domenic Polite to pursue a repeat cardiac catheterization if the patient had progressive symptoms. Dr. Harrington Challenger reviewed with the patient on 01/21/2020 and he is in agreement to proceed. Patient was transferred to [facility] and underwent a cardiac catheterization earlier today with Dr. Tamala Julian. Results showed severe, calcific coronary artery disease involving the LAD, Circumflex, and left main (40-50% LM stenosis). Dr. Roxan Hockey has been consulted for the consideration of coronary artery bypass grafting surgery. At the time of my exam, the patient denied chest pressure/pain, pain to shoulder blades, or shortness of breath.  Current Activity/  Functional Status: Patient is independent with mobility/ambulation, transfers, ADL's, IADL's.   Zubrod Score: At the time of surgery this patients most appropriate activity status/level should be described as: []     0    Normal activity, no symptoms [x]     1    Restricted in physical strenuous activity but ambulatory, able to do out light work []     2    Ambulatory and capable of self care, unable to do work activities, up and about more than 50% of the time                            []     3    Only limited self care, in bed greater than 50% of waking hours []     4    Completely disabled, no self care, confined to bed or chair []     5    Moribund  Past Medical History:  Diagnosis Date   Arthritis    Basal cell carcinoma    Coronary artery disease    DES to LAD and circumflex April 2014, PTCA ostial circumflex 03/2016 due to ISR, 9/18 PCI/DESx2 osital Lcx (ISR), p/mLCx    Dyslipidemia    Essential hypertension    GERD (gastroesophageal reflux disease)    Hematuria    History of blood transfusion 09/2012   History of kidney stones    NSTEMI (non-ST elevated myocardial infarction) ([place]) 08/2012   Type II diabetes mellitus ([place])     Past  Surgical History:  Procedure Laterality Date  . BASAL CELL CARCINOMA EXCISION     "right cheek; both shoulders"  . CARDIAC CATHETERIZATION    . CATARACT EXTRACTION W/ INTRAOCULAR LENS  IMPLANT, BILATERAL Bilateral   . COLONOSCOPY N/A 03/19/2016   Procedure: COLONOSCOPY;  Surgeon: Najeeb U Rehman, MD;  Location: [facility];  Service: Endoscopy;  Laterality: N/A;  9:15  . CORONARY BALLOON ANGIOPLASTY N/A 04/15/2016   Procedure: Coronary Balloon Angioplasty;  Surgeon: Peter M Jordan, MD;  Location: [facility] INVASIVE CV LAB;  Service: Cardiovascular;  Laterality: N/A;  . CORONARY STENT INTERVENTION N/A 10/11/2016   Procedure: CORONARY STENT INTERVENTION;  Surgeon: Varanasi, Jayadeep S, MD;  Location: [facility] INVASIVE CV LAB;  Service: Cardiovascular;   Laterality: N/A;  . CYSTOSCOPY W/ URETEROSCOPY W/ LITHOTRIPSY  10/2012   /notes 11/10/2012  . CYSTOSCOPY WITH STENT PLACEMENT  08/2012; 09/2012  . EYE SURGERY Left ~ 02/2016   "for crinkled lens"  . LEFT HEART CATH AND CORONARY ANGIOGRAPHY N/A 04/15/2016   Procedure: Left Heart Cath and Coronary Angiography;  Surgeon: Peter M Jordan, MD;  Location: [facility] INVASIVE CV LAB;  Service: Cardiovascular;  Laterality: N/A;  . LEFT HEART CATH AND CORONARY ANGIOGRAPHY N/A 10/11/2016   Procedure: LEFT HEART CATH AND CORONARY ANGIOGRAPHY;  Surgeon: Varanasi, Jayadeep S, MD;  Location: [facility] INVAS[facility] CV LAB;  Service: Cardiovascular;  Laterality: N/A;  . LEFT HEART CATH AND CORONARY ANGIOGRAPHY N/A 03/31/2018   Procedure: LEFT HEART CATH AND CORONARY ANGIOGRAPHY;  Surgeon: Kelly, Thomas A, MD;  Location: [facility] INVAS[facility] CV LAB;  Service: Cardiovascular;  Laterality: N/A;  . LEFT HEART CATHETERIZATION WITH CORONARY ANGIOGRAM N/A 05/05/2012   Procedure: LEFT HEART CATHETERIZATION WITH CORONARY ANGIOGRAM;  Surgeon: Christopher D McAlhany, MD;  Location: [facility] CATH LAB;  Service: Cardiovascular;  Laterality: N/A;  . LEFT HEART CATHETERIZATION WITH CORONARY ANGIOGRAM N/A 05/15/2012   Procedure: LEFT HEART CATHETERIZATION WITH CORONARY ANGIOGRAM;  Surgeon: Christopher D McAlhany, MD;  Location: [facility] CATH LAB;  Service: Cardiovascular;  Laterality: N/A;  . TONSILLECTOMY  1948    Social History   Tobacco Use  Smoking Status Former Smoker  . Packs/day: 0.75  . Years: 30.00  . Pack years: 22.50  . Types: Cigarettes  . Start date: 01/25/1982  . Quit date: 11/05/1988  . Years since quitting: 31.2  Smokeless Tobacco Never Used    Social History   Substance and Sexual Activity  Alcohol Use Not Currently  . Alcohol/week: 0.0 standard drinks     Allergies  Allergen Reactions  . Penicillins Other (See Comments)    Passed out as a child Has patient had a PCN reaction causing immediate rash, facial/tongue/throat swelling, SOB or  lightheadedness with hypotension: Unk Has patient had a PCN reaction causing severe rash involving mucus membranes or skin necrosis: Unk Has patient had a PCN reaction that required hospitalization: Unk Has patient had a PCN reaction occurring within the last 10 years: No If all of the above answers are "NO", then may proceed with Cephalosporin use.   . Contrast Media [Iodinated Diagnostic Agents] Other (See Comments)    Had "welts" on arm, and kidney issues after given dye in the past    Current Facility-Administered Medications  Medication Dose Route Frequency Provider Last Rate Last Admin  . 0.9 %  sodium chloride infusion   Intravenous Continuous Smith, Henry W, MD      . 0.9 %  sodium chloride infusion  250 mL Intravenous PRN Smith, Henry W, MD      .   acetaminophen (TYLENOL) tablet 650 mg  650 mg Oral Q4H PRN Ahmed Prima, [place] M, PA-C       acetaminophen (TYLENOL) tablet 650 mg  650 mg Oral Q4H PRN Belva Crome, MD       amLODipine (NORVASC) tablet 10 mg  10 mg Oral Daily Bernerd Pho M, PA-C   10 mg at 01/21/20 0840   aspirin chewable tablet 81 mg  81 mg Oral Daily Bernerd Pho M, PA-C   81 mg at 01/21/20 0840   aspirin chewable tablet 81 mg  81 mg Oral Daily Belva Crome, MD       atorvastatin (LIPITOR) tablet 80 mg  80 mg Oral QHS Strader, [place] M, PA-C       carvedilol (COREG) tablet 25 mg  25 mg Oral BID WC Ahmed Prima, [place] M, PA-C   25 mg at 01/21/20 [id number]   [START ON 01/22/2020] furosemide (LASIX) tablet 20 mg  20 mg Oral QODAY Strader, [place] M, PA-C       glimepiride (AMARYL) tablet 1 mg  1 mg Oral Q breakfast Strader, [place] M, PA-C       heparin ADULT infusion 100 units/mL (25000 units/261mL)  1,100 Units/hr Intravenous Continuous Erma Heritage, PA-C 11 mL/hr at 01/21/20 0914 1,100 Units/hr at 01/21/20 0914   hydrALAZINE (APRESOLINE) injection 10 mg  10 mg Intravenous Q20 Min PRN Belva Crome, MD       insulin aspart (novoLOG) injection  0-15 Units  0-15 Units Subcutaneous TID WC Erma Heritage, PA-C   3 Units at 01/21/20 [id number]   isosorbide mononitrate (IMDUR) 24 hr tablet 30 mg  30 mg Oral BID Strader, Brittany M, PA-C       labetalol (NORMODYNE) injection 10 mg  10 mg Intravenous Q10 min PRN Belva Crome, MD       nitroGLYCERIN (NITROSTAT) SL tablet 0.4 mg  0.4 mg Sublingual Q5 min PRN Ahmed Prima, [place] M, PA-C       ondansetron [facility]) injection 4 mg  4 mg Intravenous Q6H PRN Ahmed Prima, [place] M, PA-C       ondansetron [facility]) injection 4 mg  4 mg Intravenous Q6H PRN Belva Crome, MD       oxyCODONE (Oxy IR/ROXICODONE) immediate release tablet 5-10 mg  5-10 mg Oral Q4H PRN Belva Crome, MD       potassium chloride SA (KLOR-CON) CR tablet 20 mEq  20 mEq Oral Daily Bernerd Pho M, PA-C   20 mEq at 01/21/20 0840   sodium chloride flush (NS) 0.9 % injection 3 mL  3 mL Intravenous Q12H Belva Crome, MD       sodium chloride flush (NS) 0.9 % injection 3 mL  3 mL Intravenous PRN Belva Crome, MD       tamsulosin [facility]) capsule 0.4 mg  0.4 mg Oral BID Strader, Brittany M, PA-C   0.4 mg at 01/21/20 0840    Medications Prior to Admission  Medication Sig Dispense Refill Last Dose   amLODipine (NORVASC) 10 MG tablet Take 1 tablet (10 mg total) by mouth daily. 30 tablet 3 01/20/2020 at Unknown time   aspirin 81 MG tablet Take 81 mg by mouth daily.   01/20/2020 at Unknown time   atorvastatin (LIPITOR) 80 MG tablet Take 80 mg by mouth at bedtime.   01/20/2020 at Unknown time   carvedilol (COREG) 25 MG tablet Take 1 tablet (25 mg total) by mouth 2 (two) times daily with a meal. 60 tablet  11 01/20/2020 at 1800   clopidogrel (PLAVIX) 75 MG tablet TAKE 1 TABLET BY MOUTH  DAILY 90 tablet 3 01/20/2020 at 0730   furosemide (LASIX) 20 MG tablet Take 1 tablet (20 mg total) by mouth every other day. 45 tablet 3 01/20/2020 at Unknown time   glimepiride (AMARYL) 1 MG tablet Take 1 mg by mouth daily with  breakfast.    01/20/2020 at Unknown time   isosorbide mononitrate (IMDUR) 30 MG 24 hr tablet Take 1 tablet (30 mg total) by mouth 2 (two) times daily. 180 tablet 3 01/20/2020 at Unknown time   ketoconazole (NIZORAL) 2 % cream Apply 1 application topically 2 (two) times daily as needed (apply to face as needed (uses during the winter)). For dry skin   01/20/2020 at Unknown time   lisinopril (PRINIVIL,ZESTRIL) 40 MG tablet Take 1 tablet (40 mg total) by mouth daily. 30 tablet 3 01/20/2020 at Unknown time   metFORMIN (GLUCOPHAGE) 1000 MG tablet Take 1 tablet (1,000 mg total) by mouth 2 (two) times daily.   01/20/2020 at Unknown time   nitroGLYCERIN (NITROSTAT) 0.4 MG SL tablet DISSOLVE 1 TABLET UNDER THE TONGUE EVERY 5 MINUTES AS  NEEDED FOR CHEST PAIN. MAX  OF 3 TABLETS IN 15 MINUTES. CALL 911 IF PAIN PERSISTS. (Patient taking differently: Place 0.4 mg under the tongue every 5 (five) minutes as needed.) 100 tablet 3 01/20/2020 at Unknown time   potassium chloride SA (K-DUR,KLOR-CON) 20 MEQ tablet Take 1 tablet (20 mEq total) by mouth daily. 30 tablet 11 01/20/2020 at Unknown time   psyllium (METAMUCIL SMOOTH TEXTURE) 28 % packet Take 1 packet by mouth at bedtime.   01/20/2020 at Unknown time   tamsulosin (FLOMAX) 0.4 MG CAPS capsule Take 0.4 mg by mouth 2 (two) times daily.    01/20/2020 at Unknown time    Family History  Problem Relation Age of Onset   Heart disease Mother    Heart failure Mother    Heart disease Father    Heart attack Father    Colon cancer Neg Hx   Mother had CHF and died in May 29, 2002;Father had MI and died at age 17 Patient is married. He used to work for [employer] then for [employer], where he retired in 2005-05-28. He has a son and a daughter and 3 grandchildren. He has had COVID vaccine and booster.  Review of Systems:     Cardiac Review of Systems: Y or  [  N  ]= no  Chest Pain [ on admission  ]  Resting SOB [ N  ] Exertional SOB  [ sometimes ]   Pedal Edema [  N ]     Palpitations [ N ] Syncope  Aqua.Slicker  ]   Presyncope [ N  ]  General Review of Systems: [Y] = yes [N  ]=no Constitional: nausea [ N ];  fever [N  ]; or chills [ N ]                                                                 Eye :   Amaurosis fugax[ N ]; Resp: cough Aqua.Slicker  ];  wheezing[ N ];  hemoptysis[N  ];  GI:   vomiting[ N ];  melena[N  ];  hematochezia [ N ];  GU: hematuria[N  ];               Skin: rash, swelling[N  ];  Heme/Lymph: bruising[  ];  bleeding[  ];  anemia[ N ];  Neuro: TIA[ N ];  stroke[ N ];  vertigo[ N ];  seizures[N  ];      Endocrine: diabetes[Y  ];  thyroid dysfunction[N  ];                Physical Exam: BP 135/79    Pulse 86    Temp 98.1 F (36.7 C)    Resp 16    Ht 5\' 8"  (1.727 m)    Wt 97.1 kg    SpO2 100%    BMI 32.54 kg/m    General appearance: alert, cooperative and no distress Head: Normocephalic, without obvious abnormality, atraumatic Neck: no carotid bruit, no JVD and supple, symmetrical, trachea midline Resp: clear to auscultation bilaterally Cardio: RRR, no murmur GI: Soft, mildly obese, non tender, bowel sounds Extremities: No LE edema. Palpable DP/PT bilaterally Neurologic: Grossly normal  Diagnostic Studies & Laboratory data:     Recent Radiology Findings:   DG Chest Port 1 View  Result Date: 01/20/2020 CLINICAL DATA:  Chest pain EXAM: PORTABLE CHEST 1 VIEW COMPARISON:  March 31, 2018 FINDINGS: The heart size and mediastinal contours are within normal limits. Both lungs are clear. The visualized skeletal structures are unremarkable. Aortic calcifications are noted. IMPRESSION: No active disease. Electronically Signed   By: Constance Holster M.D.   On: 01/20/2020 22:13     I have independently reviewed the above radiologic studies and discussed with the patient   Recent Lab Findings: Lab Results  Component Value Date   WBC 8.8 01/20/2020   HGB 15.4 01/20/2020   HCT 45.1 01/20/2020   PLT 183 01/20/2020   GLUCOSE 161 (H) 01/20/2020    CHOL 107 04/01/2018   TRIG 26 04/01/2018   HDL 45 04/01/2018   LDLCALC 57 04/01/2018   ALT 23 01/20/2020   AST 20 01/20/2020   NA 141 01/20/2020   K 3.5 01/20/2020   CL 101 01/20/2020   CREATININE 0.86 01/20/2020   BUN 13 01/20/2020   CO2 27 01/20/2020   TSH 2.786 10/09/2016   INR 1.0 03/31/2018   HGBA1C 7.6 (H) 01/20/2020   Cardiac cath Conclusion   Severe calcific coronary disease involving the left anterior descending, circumflex, and left main.  40 to 50% eccentric distal left main  Bulky eccentric 80% ostial to proximal LAD showing progression compared to 2020.  Moderate mid to distal diffuse LAD disease.  Diffuse ostial to proximal in-stent restenosis in circumflex which has 2 layers of stent.  Moderate mid to distal circumflex 50% narrowing.  Occlusion of ramus intermedius, very small and fills by left to left collaterals.  Dominant right coronary with luminal irregularities but no high-grade obstruction.  Normal anterior wall motion.  Estimated > EF 50%.  Inferior wall not well seen.  LVEDP is normal.  RECOMMENDATIONS:   Discontinue Plavix  IV heparin  Recommend two-vessel coronary bypass surgery in the setting of significant proximal LAD disease and in-stent restenosis x3 of the ostial circumflex (2 layers of stent).  2D Doppler echocardiogram to fully assess LV function.  I personally reviewed the cath images and agree with the findings noted above  ECHO  IMPRESSIONS    1. Left ventricular ejection fraction, by estimation, is 55 to 60%. The  left ventricle has normal function. The left ventricle has  no regional  wall motion abnormalities. There is mild concentric left ventricular  hypertrophy. Left ventricular diastolic  parameters are consistent with Grade I diastolic dysfunction (impaired  relaxation).  2. Right ventricular systolic function is normal. The right ventricular  size is normal.  3. Left atrial size was mildly dilated.  4. The  mitral valve is abnormal. Trivial mitral valve regurgitation.  Moderate mitral annular calcification.  5. The aortic valve is abnormal. There is moderate calcification of the  aortic valve. There is moderate thickening of the aortic valve. Aortic  valve regurgitation is not visualized. Mild aortic valve stenosis.   Assessment / Plan:   1. History of coronary artery disease- Would benefit from coronary artery bypass grafting surgery. Dr. Roxan Hockey to evaluate the patient. Last Plavix dose, per patient, this morning. Plavix now stopped. Will check P2Y12. Dr. Roxan Hockey to determine timing of surgery as will need Plavix washout. 2. History of dyslipidemia-on Atorvastatin 80 mg at hs 3. History of DM-HGA1C 7.6. On Glimepiride 1 mg daily and Metformin 1000 mg bid prior to admission 4. History of hypertension-on Amlodipine 10 mg daily, Carvedilol 25 mg bid, Lisinopril 40 mg daily, Imdur 30 mg bid prior to admission 5. History of remote tobacco abuse   I  spent 20 minutes counseling the patient face to face.   Lars Pinks PA-C 01/21/2020 3:15 PM  Patient seen and examined, chart reviewed, cath images and echo report reviewed. 79 yo man with known CAD with previous PTCA stenting of LAD and circumflex. Presents with new onset angina. At cath has severe 2 vessel CAD involving LAD and circumflex with preserved LV function. CABG indicated for survival benefit and relief of symptoms.  I discussed the general nature of the procedure, the need for general anesthesia, the use of cardiopulmonary bypass, the incisions to be used and the use of draiange tubes postoperatively with Mr. Gorman. We discussed the expected hospital stay, overall recovery and short and long term outcomes. I informed him of the indications, risks, benefits and alternatives. He understands the risks include, but are not limited to death, stroke, MI, DVT/PE, bleeding, possible need for transfusion, infections, cardiac  arrhythmias, as well as other organ system dysfunction including respiratory, renal, or GI complications.   He accepts the risks and agrees to proceed.  He was on Plavix including a dose yesterday AM. Timing of CABG to be determined.  Revonda Standard Roxan Hockey, MD [facility] [phone]

## 2020-01-21 NOTE — CV Procedure (Addendum)
   Repeat restenosis ostial proximal circumflex which contains a double layer of stent and is currently 75 to 80% stenosed.  Distal 40 to 50% left main  Ostial to proximal 80% eccentric proximal LAD with moderate diffuse disease in the mid to distal LAD, up to 75%..  Widely patent native right coronary, dominant.  LV was poorly assessed by ventriculography.  LV function is normal with estimated EF 50 to 60%.  RECOMMENDATIONS: Complex distal left main, progression of de novo ostial to proximal LAD disease, and repeat in-stent restenosis of the twice stented ostial circumflex.  Plan consider surgical revascularization

## 2020-01-21 NOTE — Progress Notes (Signed)
ANTICOAGULATION CONSULT NOTE - Initial Consult  Pharmacy Consult for Heparin Indication: chest pain/ACS  Allergies  Allergen Reactions  . Penicillins Other (See Comments)    Passed out as a child Has patient had a PCN reaction causing immediate rash, facial/tongue/throat swelling, SOB or lightheadedness with hypotension: Unk Has patient had a PCN reaction causing severe rash involving mucus membranes or skin necrosis: Unk Has patient had a PCN reaction that required hospitalization: Unk Has patient had a PCN reaction occurring within the last 10 years: No If all of the above answers are "NO", then may proceed with Cephalosporin use.   . Contrast Media [Iodinated Diagnostic Agents] Other (See Comments)    Had "welts" on arm, and kidney issues after given dye in the past    Patient Measurements: Height: 5\' 8"  (172.7 cm) Weight: 97 kg (213 lb 13.5 oz) IBW/kg (Calculated) : 68.4 Heparin Dosing Weight: 89 kg   Vital Signs: Temp: 98.1 F (36.7 C) (12/27 1101) BP: 135/79 (12/27 1102) Pulse Rate: 86 (12/27 1102)  Labs: Recent Labs    01/20/20 2045 01/20/20 2245  HGB 15.4  --   HCT 45.1  --   PLT 183  --   CREATININE 0.86  --   TROPONINIHS 8 10    Estimated Creatinine Clearance: 79.9 mL/min (by C-G formula based on SCr of 0.86 mg/dL).   Medical History: Past Medical History:  Diagnosis Date  . Arthritis   . Basal cell carcinoma   . Coronary artery disease    DES to LAD and circumflex April 2014, PTCA ostial circumflex 03/2016 due to ISR, 9/18 PCI/DESx2 osital Lcx (ISR), p/mLCx   . Dyslipidemia   . Essential hypertension   . GERD (gastroesophageal reflux disease)   . Hematuria   . History of blood transfusion 09/2012  . History of kidney stones   . NSTEMI (non-ST elevated myocardial infarction) ([facility]) 08/2012  . Type II diabetes mellitus [facility])      Assessment: 79 yo male presented on 01/20/2020 with chest pain. Patient had LHC today and found severe coronary  disease in LAD, circumflex and left main with recommendation for 2v CABG. Pharmacy consulted to resume heparin 8 hours after sheath removal. Sheath removed at 1437.   Goal of Therapy:  Heparin level 0.3-0.7 units/ml Monitor platelets by anticoagulation protocol: Yes   Plan:  Resume heparin at 1100 units/hr at 2230 Check heparin level 0630  Monitor heparin level, CBC and S/S of bleeding daily   01/22/2020, PharmD Clinical Pharmacist  01/21/2020,3:16 PM

## 2020-01-21 NOTE — ED Notes (Signed)
Hospitalist at bedside 

## 2020-01-21 NOTE — Consult Note (Addendum)
Cardiology Consult    Patient ID: CLYDE GODBOLT; [id number]; 1940/05/29   Admit date: 01/20/2020 Date of Consult: 01/21/2020  Primary Care Provider: Selinda Flavin, MD Primary Cardiologist: Nona Dell, MD   Patient Profile    Sean Villarreal is a 79 y.o. male with past medical history of CAD (s/p DES to LAD and LCx in 04/2012, PTCA to ostial LCx in 03/2016 due to ISR, DES to ostial LCx in 09/2016 due to ISR, patent stents by repeat cath in 03/2018), mild AS, HTN, HLD and Type 2 DM who is being seen today for the evaluation of chest pain at the request of Dr. Robb Matar.   History of Present Illness    Mr. Edenfield was last examined by Dr. Diona Browner in 11/2019 and reported having stable anginal symptoms at that time with discomfort in his shoulder blades but denied any progression of symptoms and had not utilized sublingual nitroglycerin. Imdur was titrated to 30 mg twice daily and he was continued on ASA, Plavix, Lipitor, Coreg and Lisinopril. Was recommended if symptoms escalated to consider a follow-up cardiac catheterization to reevaluate for revascularization options.  He presented to [facility] ED on 01/20/2020 for evaluation of chest pain throughout the day which radiated into his arm. In talking with the patient today, he reports he has continued to experience shoulder blade discomfort since his last office visit with Dr. Diona Browner and has now started to experience episodes of chest pressure which radiates into his arm. Says shoulder blade discomfort was his prior anginal symptom. Says this typically occurs with activity and progresses throughout the day as he is very active around his home. Over the past few days, his symptoms continued to intensify in frequency and severity but he postponed coming to the ED due to the Christmas holiday. Yesterday, he developed associated dizziness which prompted him to come to the ED that day. He denies any associated presyncope. No recent orthopnea,  PND or lower extremity edema. He has been utilizing sublingual nitroglycerin at home with improvement in his symptoms.  Initial labs show WBC 8.8, Hgb 15.4, platelets 183, Na+ 141, K+ 3.5 and creatinine 0.86.  LFTs within normal limits. COVID Negative. Initial HS Troponin 8 with repeat of 10. CXR with no active cardiopulmonary disease. EKG shows NSR, HR 94 with slight J-point elevation along Lead III.   Past Medical History:  Diagnosis Date  . Arthritis   . Basal cell carcinoma   . Coronary artery disease    DES to LAD and circumflex April 2014, PTCA ostial circumflex 03/2016 due to ISR, 9/18 PCI/DESx2 osital Lcx (ISR), p/mLCx   . Dyslipidemia   . Essential hypertension   . GERD (gastroesophageal reflux disease)   . Hematuria   . History of blood transfusion 09/2012  . History of kidney stones   . NSTEMI (non-ST elevated myocardial infarction) (HCC) 08/2012  . Type II diabetes mellitus (HCC)     Past Surgical History:  Procedure Laterality Date  . BASAL CELL CARCINOMA EXCISION     "right cheek; both shoulders"  . CARDIAC CATHETERIZATION    . CATARACT EXTRACTION W/ INTRAOCULAR LENS  IMPLANT, BILATERAL Bilateral   . COLONOSCOPY N/A 03/19/2016   Procedure: COLONOSCOPY;  Surgeon: Malissa Hippo, MD;  Location: [facility];  Service: Endoscopy;  Laterality: N/A;  9:15  . CORONARY BALLOON ANGIOPLASTY N/A 04/15/2016   Procedure: Coronary Balloon Angioplasty;  Surgeon: Peter M Swaziland, MD;  Location: [facility] INVASIVE CV LAB;  Service: Cardiovascular;  Laterality:  N/A;  . CORONARY STENT INTERVENTION N/A 10/11/2016   Procedure: CORONARY STENT INTERVENTION;  Surgeon: Jettie Booze, MD;  Location: [place] CV LAB;  Service: Cardiovascular;  Laterality: N/A;  . CYSTOSCOPY W/ URETEROSCOPY W/ LITHOTRIPSY  10/2012   Archie Endo 11/10/2012  . CYSTOSCOPY WITH STENT PLACEMENT  08/2012; 09/2012  . EYE SURGERY Left ~ 02/2016   "for crinkled lens"  . LEFT HEART CATH AND CORONARY ANGIOGRAPHY N/A 04/15/2016    Procedure: Left Heart Cath and Coronary Angiography;  Surgeon: Peter M Martinique, MD;  Location: [place] CV LAB;  Service: Cardiovascular;  Laterality: N/A;  . LEFT HEART CATH AND CORONARY ANGIOGRAPHY N/A 10/11/2016   Procedure: LEFT HEART CATH AND CORONARY ANGIOGRAPHY;  Surgeon: Jettie Booze, MD;  Location: [place] CV LAB;  Service: Cardiovascular;  Laterality: N/A;  . LEFT HEART CATH AND CORONARY ANGIOGRAPHY N/A 03/31/2018   Procedure: LEFT HEART CATH AND CORONARY ANGIOGRAPHY;  Surgeon: Troy Sine, MD;  Location: [place] CV LAB;  Service: Cardiovascular;  Laterality: N/A;  . LEFT HEART CATHETERIZATION WITH CORONARY ANGIOGRAM N/A 05/05/2012   Procedure: LEFT HEART CATHETERIZATION WITH CORONARY ANGIOGRAM;  Surgeon: Burnell Blanks, MD;  Location: [facility] CATH LAB;  Service: Cardiovascular;  Laterality: N/A;  . LEFT HEART CATHETERIZATION WITH CORONARY ANGIOGRAM N/A 05/15/2012   Procedure: LEFT HEART CATHETERIZATION WITH CORONARY ANGIOGRAM;  Surgeon: Burnell Blanks, MD;  Location: [facility] CATH LAB;  Service: Cardiovascular;  Laterality: N/A;  . TONSILLECTOMY  1947     Home Medications:  Prior to Admission medications   Medication Sig Start Date End Date Taking? Authorizing Provider  amLODipine (NORVASC) 10 MG tablet Take 1 tablet (10 mg total) by mouth daily. 06/13/12  Yes Serpe, Burna Forts, PA-C  aspirin 81 MG tablet Take 81 mg by mouth daily.   Yes [provider]  atorvastatin (LIPITOR) 80 MG tablet Take 80 mg by mouth at bedtime. 05/06/12  Yes Edmisten, Azzie Roup, PA-C  carvedilol (COREG) 25 MG tablet Take 1 tablet (25 mg total) by mouth 2 (two) times daily with a meal. 05/15/12  Yes Barrett, Evelene Croon, PA-C  clopidogrel (PLAVIX) 75 MG tablet TAKE 1 TABLET BY MOUTH  DAILY 09/03/19  Yes Satira Sark, MD  furosemide (LASIX) 20 MG tablet Take 1 tablet (20 mg total) by mouth every other day. 01/09/16  Yes Satira Sark, MD  glimepiride (AMARYL) 1 MG tablet Take  1 mg by mouth daily with breakfast.  03/03/12  Yes [provider]  isosorbide mononitrate (IMDUR) 30 MG 24 hr tablet Take 1 tablet (30 mg total) by mouth 2 (two) times daily. 12/11/19  Yes Satira Sark, MD  ketoconazole (NIZORAL) 2 % cream Apply 1 application topically 2 (two) times daily as needed (apply to face as needed (uses during the winter)). For dry skin   Yes [provider]  lisinopril (PRINIVIL,ZESTRIL) 40 MG tablet Take 1 tablet (40 mg total) by mouth daily. 05/15/12  Yes Barrett, Evelene Croon, PA-C  metFORMIN (GLUCOPHAGE) 1000 MG tablet Take 1 tablet (1,000 mg total) by mouth 2 (two) times daily. 04/03/18  Yes Simmons, Brittainy M, PA-C  nitroGLYCERIN (NITROSTAT) 0.4 MG SL tablet DISSOLVE 1 TABLET UNDER THE TONGUE EVERY 5 MINUTES AS  NEEDED FOR CHEST PAIN. MAX  OF 3 TABLETS IN 15 MINUTES. CALL 911 IF PAIN PERSISTS. Patient taking differently: Place 0.4 mg under the tongue every 5 (five) minutes as needed. 01/01/20  Yes Satira Sark, MD  potassium chloride SA (  K-DUR,KLOR-CON) 20 MEQ tablet Take 1 tablet (20 mEq total) by mouth daily. 05/15/12  Yes Barrett, Evelene Croon, PA-C  psyllium (METAMUCIL SMOOTH TEXTURE) 28 % packet Take 1 packet by mouth at bedtime. 03/19/16  Yes Rehman, Mechele Dawley, MD  tamsulosin (FLOMAX) 0.4 MG CAPS capsule Take 0.4 mg by mouth 2 (two) times daily.    Yes [provider]    Inpatient Medications: Scheduled Meds: . amLODipine  10 mg Oral Daily  . aspirin  81 mg Oral Daily  . atorvastatin  80 mg Oral QHS  . carvedilol  25 mg Oral BID WC  . clopidogrel  75 mg Oral Daily  . enoxaparin (LOVENOX) injection  40 mg Subcutaneous Q24H  . [START ON 01/22/2020] furosemide  20 mg Oral QODAY  . glimepiride  1 mg Oral Q breakfast  . insulin aspart  0-15 Units Subcutaneous TID WC  . isosorbide mononitrate  30 mg Oral BID  . lisinopril  40 mg Oral Daily  . metFORMIN  1,000 mg Oral BID WC  . potassium chloride SA  20 mEq Oral Daily  . tamsulosin   0.4 mg Oral BID   Continuous Infusions:  PRN Meds: acetaminophen, nitroGLYCERIN, ondansetron (ZOFRAN) IV  Allergies:    Allergies  Allergen Reactions  . Penicillins Other (See Comments)    Passed out as a child Has patient had a PCN reaction causing immediate rash, facial/tongue/throat swelling, SOB or lightheadedness with hypotension: Unk Has patient had a PCN reaction causing severe rash involving mucus membranes or skin necrosis: Unk Has patient had a PCN reaction that required hospitalization: Unk Has patient had a PCN reaction occurring within the last 10 years: No If all of the above answers are "NO", then may proceed with Cephalosporin use.   . Contrast Media [Iodinated Diagnostic Agents] Other (See Comments)    Had "welts" on arm, and kidney issues after given dye in the past    Social History:   Social History   Socioeconomic History  . Marital status: Married    Spouse name: Not on file  . Number of children: Not on file  . Years of education: Not on file  . Highest education level: Not on file  Occupational History  . Not on file  Tobacco Use  . Smoking status: Former Smoker    Packs/day: 0.75    Years: 30.00    Pack years: 22.50    Types: Cigarettes    Start date: 01/25/1982    Quit date: 11/05/1988    Years since quitting: 31.2  . Smokeless tobacco: Never Used  Vaping Use  . Vaping Use: Never used  Substance and Sexual Activity  . Alcohol use: Not Currently    Alcohol/week: 0.0 standard drinks  . Drug use: No  . Sexual activity: Not Currently  Other Topics Concern  . Not on file  Social History Narrative  . Not on file   Social Determinants of Health   Financial Resource Strain: Not on file  Food Insecurity: Not on file  Transportation Needs: Not on file  Physical Activity: Not on file  Stress: Not on file  Social Connections: Not on file  Intimate Partner Violence: Not on file     Family History:    Family History  Problem Relation Age  of Onset  . Heart disease Mother   . Heart failure Mother   . Heart disease Father   . Heart attack Father   . Colon cancer Neg Hx  Review of Systems    General:  No chills, fever, night sweats or weight changes.  Cardiovascular:  No edema, orthopnea, palpitations, paroxysmal nocturnal dyspnea. Positive for chest pain, shoulder pain and dyspnea on exertion.  Dermatological: No rash, lesions/masses Respiratory: No cough, dyspnea Urologic: No hematuria, dysuria Abdominal:   No nausea, vomiting, diarrhea, bright red blood per rectum, melena, or hematemesis Neurologic:  No visual changes, wkns, changes in mental status. All other systems reviewed and are otherwise negative except as noted above.  Physical Exam/Data    Vitals:   01/20/20 2330 01/21/20 0130 01/21/20 0400 01/21/20 0600  BP: (!) 154/82 133/77 122/71 (!) 143/81  Pulse: 77 75 74 72  Resp: 15 12 15 15   Temp:      TempSrc:      SpO2: 98% 96% 95% 94%  Weight:      Height:        Intake/Output Summary (Last 24 hours) at 01/21/2020 0738 Last data filed at 01/21/2020 0425 Gross per 24 hour  Intake 100 ml  Output --  Net 100 ml   Filed Weights   01/20/20 2018  Weight: 97.1 kg   Body mass index is 32.54 kg/m.   General: Pleasant male appearing in NAD Psych: Normal affect. Neuro: Alert and oriented X 3. Moves all extremities spontaneously. HEENT: Normal  Neck: Supple without bruits or JVD. Lungs:  Resp regular and unlabored, CTA without wheezing or rales. Heart: RRR no s3, s4, 2/6 SEM along RUSB.  Abdomen: Soft, non-tender, non-distended, BS + x 4.  Extremities: No clubbing, cyanosis or edema. DP/PT/Radials 2+ and equal bilaterally.   EKG:  The EKG was personally reviewed and demonstrates: NSR, HR 94 with slight J-point elevation along Lead III.   Telemetry:  Telemetry was personally reviewed and demonstrates: NSR, HR in 70's to 80's.   Labs/Studies     Relevant CV Studies:  Cardiac  Catheterization: 03/2018  Prox RCA to Mid RCA lesion is 20% stenosed.  Ramus lesion is 100% stenosed.  Previously placed Ost Cx to Prox Cx stent (unknown type) is widely patent.  Previously placed Prox Cx to Mid Cx stent (unknown type) is widely patent.  Ost LAD lesion is 20% stenosed.  Previously placed Prox LAD stent (unknown type) is widely patent.  Mid LAD lesion is 60% stenosed.   Preserved global LV function with an ejection fraction at least 55 to 60% without focal segmental wall motion abnormalities.  LVEDP 14 mm.  Widely patent proximal LAD stent with ostial 20% eccentric LAD narrowing and distal 60% stenosis.  There is a very small caliber high diagonal almost ramus intermediate like vessel which was previously noted to be occluded.  There is retrograde collateralization of this vessel via the distal LAD.  Widely patent proximal and mid left circumflex stents,.  Mild calcification of the RCA with 20% mid narrowing and a dominant RCA vessel.  RECOMMENDATION: Medical therapy.  Catheterization does not show significant interval change from his prior assessment.  Aggressive lipid-lowering therapy with target LDL less than 70.  The patient has been maintained on dual antiplatelet therapy.  Optimal blood pressure control with ideal blood pressure less than 120/80.   Echocardiogram: 06/2019 IMPRESSIONS    1. Left ventricular ejection fraction, by estimation, is 65 to 70%. The  left ventricle has normal function. The left ventricle has no regional  wall motion abnormalities. Left ventricular diastolic parameters are  indeterminate. Elevated left atrial  pressure.  2. Right ventricular systolic function is normal. The  right ventricular  size is normal. There is normal pulmonary artery systolic pressure.  3. Left atrial size was mildly dilated.  4. The mitral valve is abnormal. Trivial mitral valve regurgitation. No  evidence of mitral stenosis.  5. The aortic  valve is tricuspid. Aortic valve regurgitation is trivial.  Mild aortic valve stenosis. Aortic valve mean gradient measures 10.7  mmHg. Aortic valve peak gradient measures 18.2 mmHg. Aortic valve area, by  VTI measures 1.60 cm.  6. The inferior vena cava is normal in size with greater than 50%  respiratory variability, suggesting right atrial pressure of 3 mmHg.   Laboratory Data:  Chemistry Recent Labs  Lab 01/20/20 2045  NA 141  K 3.5  CL 101  CO2 27  GLUCOSE 161*  BUN 13  CREATININE 0.86  CALCIUM 9.0  GFRNONAA >60  ANIONGAP 13    Recent Labs  Lab 01/20/20 2045  PROT 7.6  ALBUMIN 4.3  AST 20  ALT 23  ALKPHOS 47  BILITOT 0.7   Hematology Recent Labs  Lab 01/20/20 2045  WBC 8.8  RBC 4.74  HGB 15.4  HCT 45.1  MCV 95.1  MCH 32.5  MCHC 34.1  RDW 12.5  PLT 183   Cardiac EnzymesNo results for input(s): TROPONINI in the last 168 hours. No results for input(s): TROPIPOC in the last 168 hours.  BNPNo results for input(s): BNP, PROBNP in the last 168 hours.  DDimer No results for input(s): DDIMER in the last 168 hours.  Radiology/Studies:  DG Chest Port 1 View  Result Date: 01/20/2020 CLINICAL DATA:  Chest pain EXAM: PORTABLE CHEST 1 VIEW COMPARISON:  March 31, 2018 FINDINGS: The heart size and mediastinal contours are within normal limits. Both lungs are clear. The visualized skeletal structures are unremarkable. Aortic calcifications are noted. IMPRESSION: No active disease. Electronically Signed   By: Constance Holster M.D.   On: 01/20/2020 22:13     Assessment & Plan    1. Chest Pain concerning for Unstable Angina - He has been experiencing progressive shoulder blade pain for the past few months (prior anginal equivalent) and since 11/2019 he has developed associated chest pressure which radiates into his left arm. Imdur was previously titrated without significant improvement in his symptoms - Initial HS Troponin 8 with repeat of 10. EKG shows NSR, HR 94  with slight J-point elevation along Lead III. Will obtain a repeat 12-Lead EKG this AM.  - It was previously recommend by Dr. Domenic Polite to pursue a repeat cardiac catheterization if the patient had progressive symptoms. Reviewed with the patient today and he is in agreement to proceed. Will also review with Dr. Harrington Challenger.  - The patient understands that risks include but are not limited to stroke (1 in 1000), death (1 in 75), kidney failure [usually temporary] (1 in 500), bleeding (1 in 200), allergic reaction [possibly serious] (1 in 200). He does have a contrast allergy and Solu-Medrol and Benadryl have been ordered. Given his intermittent episodes of chest pain overnight and this AM, will start IV Heparin. If no improvement, would add IV NTG. Continue ASA, Plavix, Coreg, Atorvastatin and Imdur (will need to discontinue if starting IV NTG).    2. CAD - He is s/p DES to LAD and LCx in 04/2012, PTCA to ostial LCx in 03/2016 due to ISR, DES to ostial LCx in 09/2016 due to ISR and he did have patent stents by repeat cath in 03/2018. - Will plan for a repeat catheterization as outlined above.  -  Continue current medication regimen with ASA, Plavix, Atorvastatin and Coreg.   3. Aortic Stenosis - Mild by most recent echocardiogram in 06/2019.Will continue to follow.   4. HTN - Will continue PTA Amlodipine 10mg  daily, Coreg 25mg  BID and Imdur 30mg  BID. Will hold Lisinopril in anticipation of his cardiac catheterization.   5. HLD - LDL was at 46 in 12/2018. Will recheck FLP with AM labs. Continue Atorvastatin 80mg  daily.   6. Type 2 DM - Hold Metformin in anticipation of cardiac catheterization. SSI has been ordered.    For questions or updates, please contact [place] Please consult [email or web address] for contact info under Cardiology/STEMI.  Signed, Erma Heritage, PA-C 01/21/2020, 7:38 AM Pager: [phone]  Patient seen and examined  I agree with findings of B Strader above  Pt is  followed by [facility].  Seen in NOvember 2021   Had stable angina at that time (arm, shoulder discomfort)   This has become progressive   Yesterday had significant discomfort  Symptoms are similar to anginal symptoms he has had in the past    Came to [facility] ED  Currently without significant discomfort   On heparin IV  On exam Neck:  JVP is nromal Lungs are relatively clear  Cardiac exam   RRR  No S3  Gr  II/VI systolic murmur LSB Abd is supple  No masses Ext are without edema   Feet warm  2+ pulses    EKG shows SR with nonspecific ST changes   Impression  79 yo with known CAD   Presents now with progressive angina I would recomm L heart cath to redefine anatomy Pt agrees to proceed    Plan for Transfer to [facility] today for procedure  Continue IV heparin, ASA Pt also getting IV steroids for contrast allergy    Other recomm as noted above by B Strader.  Dorris Carnes MD

## 2020-01-21 NOTE — H&P (View-Only) (Signed)
[address]       [place], [address]             [phone]        [place] [place] Medical Record [id number] Date of Birth: 1940/09/20  Referring: Dr. Belva Crome, MD Primary Care: Rory Percy, MD Primary Cardiologist:Samuel Domenic Polite, MD  Chief Complaint:    Chief Complaint  Patient presents with  . Chest pressure/pain radiating to shoulder blades  Reason for consultation: Coronary artery disease  History of Present Illness:     This is a 79 year old male with a past medical history of coronary artery disease (s/p DES to LAD and LCx in 04/2012, PTCA to ostial LCx in 03/2016 due to ISR, DES to ostial LCx in 09/2016 due to ISR, patent stents by repeat cath in 03/2018), hypertension, hyperlipidemia, Type 2 diabetes mellitus, and mild AS who presented to [facility] ED on 01/20/2020 with complaints of chest pressure/pain radiating to  shoulder blades and dizziness. Patient denied nausea, emesis, LE edema, or diaphoresis. Patient states that this has happened mostly with exertion, but has occurred recently while at rest. He also has complaints of shortness of breath at times as well with the aforementioned symptoms. EKG showed normal sinus rhythm, LVH. Initial Troponin high sensitivity was 8. It was previously recommend by Dr. Domenic Polite to pursue a repeat cardiac catheterization if the patient had progressive symptoms. Dr. Harrington Challenger reviewed with the patient on 01/21/2020 and he is in agreement to proceed. Patient was transferred to [facility] and underwent a cardiac catheterization earlier today with Dr. Tamala Julian. Results showed severe, calcific coronary artery disease involving the LAD, Circumflex, and left main (40-50% LM stenosis). Dr. Roxan Hockey has been consulted for the consideration of coronary artery bypass grafting surgery. At the time of my exam, the patient denied chest pressure/pain, pain to shoulder blades, or shortness of breath.  Current Activity/  Functional Status: Patient is independent with mobility/ambulation, transfers, ADL's, IADL's.   Zubrod Score: At the time of surgery this patient's most appropriate activity status/level should be described as: []     0    Normal activity, no symptoms [x]     1    Restricted in physical strenuous activity but ambulatory, able to do out light work []     2    Ambulatory and capable of self care, unable to do work activities, up and about more than 50% of the time                            []     3    Only limited self care, in bed greater than 50% of waking hours []     4    Completely disabled, no self care, confined to bed or chair []     5    Moribund  Past Medical History:  Diagnosis Date  . Arthritis   . Basal cell carcinoma   . Coronary artery disease    DES to LAD and circumflex April 2014, PTCA ostial circumflex 03/2016 due to ISR, 9/18 PCI/DESx2 osital Lcx (ISR), p/mLCx   . Dyslipidemia   . Essential hypertension   . GERD (gastroesophageal reflux disease)   . Hematuria   . History of blood transfusion 09/2012  . History of kidney stones   . NSTEMI (non-ST elevated myocardial infarction) ([place]) 08/2012  . Type II diabetes mellitus (HCC)     Past  Surgical History:  Procedure Laterality Date  . BASAL CELL CARCINOMA EXCISION     "right cheek; both shoulders"  . CARDIAC CATHETERIZATION    . CATARACT EXTRACTION W/ INTRAOCULAR LENS  IMPLANT, BILATERAL Bilateral   . COLONOSCOPY N/A 03/19/2016   Procedure: COLONOSCOPY;  Surgeon: Rogene Houston, MD;  Location: [facility];  Service: Endoscopy;  Laterality: N/A;  9:15  . CORONARY BALLOON ANGIOPLASTY N/A 04/15/2016   Procedure: Coronary Balloon Angioplasty;  Surgeon: Peter M Martinique, MD;  Location: [place] CV LAB;  Service: Cardiovascular;  Laterality: N/A;  . CORONARY STENT INTERVENTION N/A 10/11/2016   Procedure: CORONARY STENT INTERVENTION;  Surgeon: Jettie Booze, MD;  Location: [place] CV LAB;  Service: Cardiovascular;   Laterality: N/A;  . CYSTOSCOPY W/ URETEROSCOPY W/ LITHOTRIPSY  10/2012   Archie Endo 11/10/2012  . CYSTOSCOPY WITH STENT PLACEMENT  08/2012; 09/2012  . EYE SURGERY Left ~ 02/2016   "for crinkled lens"  . LEFT HEART CATH AND CORONARY ANGIOGRAPHY N/A 04/15/2016   Procedure: Left Heart Cath and Coronary Angiography;  Surgeon: Peter M Martinique, MD;  Location: [place] CV LAB;  Service: Cardiovascular;  Laterality: N/A;  . LEFT HEART CATH AND CORONARY ANGIOGRAPHY N/A 10/11/2016   Procedure: LEFT HEART CATH AND CORONARY ANGIOGRAPHY;  Surgeon: Jettie Booze, MD;  Location: [place] CV LAB;  Service: Cardiovascular;  Laterality: N/A;  . LEFT HEART CATH AND CORONARY ANGIOGRAPHY N/A 03/31/2018   Procedure: LEFT HEART CATH AND CORONARY ANGIOGRAPHY;  Surgeon: Troy Sine, MD;  Location: [place] CV LAB;  Service: Cardiovascular;  Laterality: N/A;  . LEFT HEART CATHETERIZATION WITH CORONARY ANGIOGRAM N/A 05/05/2012   Procedure: LEFT HEART CATHETERIZATION WITH CORONARY ANGIOGRAM;  Surgeon: Burnell Blanks, MD;  Location: [facility] C[facility] LAB;  Service: Cardiovascular;  Laterality: N/A;  . LEFT HEART CATHETERIZATION WITH CORONARY ANGIOGRAM N/A 05/15/2012   Procedure: LEFT HEART CATHETERIZATION WITH CORONARY ANGIOGRAM;  Surgeon: Burnell Blanks, MD;  Location: [facility] CATH LAB;  Service: Cardiovascular;  Laterality: N/A;  . TONSILLECTOMY  1947    Social History   Tobacco Use  Smoking Status Former Smoker  . Packs/day: 0.75  . Years: 30.00  . Pack years: 22.50  . Types: Cigarettes  . Start date: 01/25/1982  . Quit date: 11/05/1988  . Years since quitting: 31.2  Smokeless Tobacco Never Used    Social History   Substance and Sexual Activity  Alcohol Use Not Currently  . Alcohol/week: 0.0 standard drinks     Allergies  Allergen Reactions  . Penicillins Other (See Comments)    Passed out as a child Has patient had a PCN reaction causing immediate rash, facial/tongue/throat swelling, SOB or  lightheadedness with hypotension: Unk Has patient had a PCN reaction causing severe rash involving mucus membranes or skin necrosis: Unk Has patient had a PCN reaction that required hospitalization: Unk Has patient had a PCN reaction occurring within the last 10 years: No If all of the above answers are "NO", then may proceed with Cephalosporin use.   . Contrast Media [Iodinated Diagnostic Agents] Other (See Comments)    Had "welts" on arm, and kidney issues after given dye in the past    Current Facility-Administered Medications  Medication Dose Route Frequency Provider Last Rate Last Admin  . 0.9 %  sodium chloride infusion   Intravenous Continuous Belva Crome, MD      . 0.9 %  sodium chloride infusion  250 mL Intravenous PRN Belva Crome, MD      .  acetaminophen (TYLENOL) tablet 650 mg  650 mg Oral Q4H PRN Ahmed Prima, [place] M, PA-C      . acetaminophen (TYLENOL) tablet 650 mg  650 mg Oral Q4H PRN Belva Crome, MD      . amLODipine (NORVASC) tablet 10 mg  10 mg Oral Daily Bernerd Pho M, PA-C   10 mg at 01/21/20 0840  . aspirin chewable tablet 81 mg  81 mg Oral Daily Bernerd Pho M, PA-C   81 mg at 01/21/20 0840  . aspirin chewable tablet 81 mg  81 mg Oral Daily Belva Crome, MD      . atorvastatin (LIPITOR) tablet 80 mg  80 mg Oral QHS Strader, [place] M, PA-C      . carvedilol (COREG) tablet 25 mg  25 mg Oral BID WC Strader, [place] M, PA-C   25 mg at 01/21/20 0839  . [START ON 01/22/2020] furosemide (LASIX) tablet 20 mg  20 mg Oral QODAY Strader, [place] M, PA-C      . glimepiride (AMARYL) tablet 1 mg  1 mg Oral Q breakfast Strader, [place] M, PA-C      . heparin ADULT infusion 100 units/mL (25000 units/257mL)  1,100 Units/hr Intravenous Continuous Erma Heritage, PA-C 11 mL/hr at 01/21/20 0914 1,100 Units/hr at 01/21/20 0914  . hydrALAZINE (APRESOLINE) injection 10 mg  10 mg Intravenous Q20 Min PRN Belva Crome, MD      . insulin aspart (novoLOG) injection  0-15 Units  0-15 Units Subcutaneous TID WC Erma Heritage, PA-C   3 Units at 01/21/20 [id number]  . isosorbide mononitrate (IMDUR) 24 hr tablet 30 mg  30 mg Oral BID Strader, [place] M, PA-C      . labetalol (NORMODYNE) injection 10 mg  10 mg Intravenous Q10 min PRN Belva Crome, MD      . nitroGLYCERIN (NITROSTAT) SL tablet 0.4 mg  0.4 mg Sublingual Q5 min PRN Ahmed Prima, [place] M, PA-C      . ondansetron [facility]) injection 4 mg  4 mg Intravenous Q6H PRN Strader, [place] M, PA-C      . ondansetron [facility]) injection 4 mg  4 mg Intravenous Q6H PRN Belva Crome, MD      . oxyCODONE (Oxy IR/ROXICODONE) immediate release tablet 5-10 mg  5-10 mg Oral Q4H PRN Belva Crome, MD      . potassium chloride SA (KLOR-CON) CR tablet 20 mEq  20 mEq Oral Daily Bernerd Pho M, PA-C   20 mEq at 01/21/20 0840  . sodium chloride flush (NS) 0.9 % injection 3 mL  3 mL Intravenous Q12H Belva Crome, MD      . sodium chloride flush (NS) 0.9 % injection 3 mL  3 mL Intravenous PRN Belva Crome, MD      . tamsulosin [facility]) capsule 0.4 mg  0.4 mg Oral BID Ahmed Prima, Brittany M, PA-C   0.4 mg at 01/21/20 0840    Medications Prior to Admission  Medication Sig Dispense Refill Last Dose  . amLODipine (NORVASC) 10 MG tablet Take 1 tablet (10 mg total) by mouth daily. 30 tablet 3 01/20/2020 at Unknown time  . aspirin 81 MG tablet Take 81 mg by mouth daily.   01/20/2020 at Unknown time  . atorvastatin (LIPITOR) 80 MG tablet Take 80 mg by mouth at bedtime.   01/20/2020 at Unknown time  . carvedilol (COREG) 25 MG tablet Take 1 tablet (25 mg total) by mouth 2 (two) times daily with a meal. 60 tablet  11 01/20/2020 at 1800  . clopidogrel (PLAVIX) 75 MG tablet TAKE 1 TABLET BY MOUTH  DAILY 90 tablet 3 01/20/2020 at 0730  . furosemide (LASIX) 20 MG tablet Take 1 tablet (20 mg total) by mouth every other day. 45 tablet 3 01/20/2020 at Unknown time  . glimepiride (AMARYL) 1 MG tablet Take 1 mg by mouth daily with  breakfast.    01/20/2020 at Unknown time  . isosorbide mononitrate (IMDUR) 30 MG 24 hr tablet Take 1 tablet (30 mg total) by mouth 2 (two) times daily. 180 tablet 3 01/20/2020 at Unknown time  . ketoconazole (NIZORAL) 2 % cream Apply 1 application topically 2 (two) times daily as needed (apply to face as needed (uses during the winter)). For dry skin   01/20/2020 at Unknown time  . lisinopril (PRINIVIL,ZESTRIL) 40 MG tablet Take 1 tablet (40 mg total) by mouth daily. 30 tablet 3 01/20/2020 at Unknown time  . metFORMIN (GLUCOPHAGE) 1000 MG tablet Take 1 tablet (1,000 mg total) by mouth 2 (two) times daily.   01/20/2020 at Unknown time  . nitroGLYCERIN (NITROSTAT) 0.4 MG SL tablet DISSOLVE 1 TABLET UNDER THE TONGUE EVERY 5 MINUTES AS  NEEDED FOR CHEST PAIN. MAX  OF 3 TABLETS IN 15 MINUTES. CALL 911 IF PAIN PERSISTS. (Patient taking differently: Place 0.4 mg under the tongue every 5 (five) minutes as needed.) 100 tablet 3 01/20/2020 at Unknown time  . potassium chloride SA (K-DUR,KLOR-CON) 20 MEQ tablet Take 1 tablet (20 mEq total) by mouth daily. 30 tablet 11 01/20/2020 at Unknown time  . psyllium (METAMUCIL SMOOTH TEXTURE) 28 % packet Take 1 packet by mouth at bedtime.   01/20/2020 at Unknown time  . tamsulosin (FLOMAX) 0.4 MG CAPS capsule Take 0.4 mg by mouth 2 (two) times daily.    01/20/2020 at Unknown time    Family History  Problem Relation Age of Onset  . Heart disease Mother   . Heart failure Mother   . Heart disease Father   . Heart attack Father   . Colon cancer Neg Hx   Mother had CHF and died in May 30, 2002;Father had MI and died at age 50 Patient is married. He used to work for [employer] then for [employer], where he retired in 05-29-05. He has a son and a daughter and 3 grandchildren. He has had COVID vaccine and booster.  Review of Systems:     Cardiac Review of Systems: Y or  [  N  ]= no  Chest Pain [ on admission  ]  Resting SOB [ N  ] Exertional SOB  [ sometimes ]   Pedal Edema [  N ]     Palpitations [ N ] Syncope  Klaus.Mock  ]   Presyncope [ N  ]  General Review of Systems: [Y] = yes [N  ]=no Constitional: nausea [ N ];  fever [N  ]; or chills [ N ]                                                                 Eye :   Amaurosis fugax[ N ]; Resp: cough Klaus.Mock  ];  wheezing[ N ];  hemoptysis[N  ];  GI:   vomiting[ N ];  melena[N  ];  hematochezia [ N ];  GU: hematuria[N  ];               Skin: rash, swelling[N  ];  Heme/Lymph: bruising[  ];  bleeding[  ];  anemia[ N ];  Neuro: TIA[ N ];  stroke[ N ];  vertigo[ N ];  seizures[N  ];      Endocrine: diabetes[Y  ];  thyroid dysfunction[N  ];                Physical Exam: BP 135/79   Pulse 86   Temp 98.1 F (36.7 C)   Resp 16   Ht 5\' 8"  (1.727 m)   Wt 97.1 kg   SpO2 100%   BMI 32.54 kg/m    General appearance: alert, cooperative and no distress Head: Normocephalic, without obvious abnormality, atraumatic Neck: no carotid bruit, no JVD and supple, symmetrical, trachea midline Resp: clear to auscultation bilaterally Cardio: RRR, no murmur GI: Soft, mildly obese, non tender, bowel sounds Extremities: No LE edema. Palpable DP/PT bilaterally Neurologic: Grossly normal  Diagnostic Studies & Laboratory data:     Recent Radiology Findings:   DG Chest Port 1 View  Result Date: 01/20/2020 CLINICAL DATA:  Chest pain EXAM: PORTABLE CHEST 1 VIEW COMPARISON:  March 31, 2018 FINDINGS: The heart size and mediastinal contours are within normal limits. Both lungs are clear. The visualized skeletal structures are unremarkable. Aortic calcifications are noted. IMPRESSION: No active disease. Electronically Signed   By: Constance Holster M.D.   On: 01/20/2020 22:13     I have independently reviewed the above radiologic studies and discussed with the patient   Recent Lab Findings: Lab Results  Component Value Date   WBC 8.8 01/20/2020   HGB 15.4 01/20/2020   HCT 45.1 01/20/2020   PLT 183 01/20/2020   GLUCOSE 161 (H) 01/20/2020    CHOL 107 04/01/2018   TRIG 26 04/01/2018   HDL 45 04/01/2018   LDLCALC 57 04/01/2018   ALT 23 01/20/2020   AST 20 01/20/2020   NA 141 01/20/2020   K 3.5 01/20/2020   CL 101 01/20/2020   CREATININE 0.86 01/20/2020   BUN 13 01/20/2020   CO2 27 01/20/2020   TSH 2.786 10/09/2016   INR 1.0 03/31/2018   HGBA1C 7.6 (H) 01/20/2020   Cardiac cath Conclusion   Severe calcific coronary disease involving the left anterior descending, circumflex, and left main.  40 to 50% eccentric distal left main  Bulky eccentric 80% ostial to proximal LAD showing progression compared to 2020.  Moderate mid to distal diffuse LAD disease.  Diffuse ostial to proximal in-stent restenosis in circumflex which has 2 layers of stent.  Moderate mid to distal circumflex 50% narrowing.  Occlusion of ramus intermedius, very small and fills by left to left collaterals.  Dominant right coronary with luminal irregularities but no high-grade obstruction.  Normal anterior wall motion.  Estimated > EF 50%.  Inferior wall not well seen.  LVEDP is normal.  RECOMMENDATIONS:   Discontinue Plavix  IV heparin  Recommend two-vessel coronary bypass surgery in the setting of significant proximal LAD disease and in-stent restenosis x3 of the ostial circumflex (2 layers of stent).  2D Doppler echocardiogram to fully assess LV function.  I personally reviewed the cath images and agree with the findings noted above  ECHO  IMPRESSIONS    1. Left ventricular ejection fraction, by estimation, is 55 to 60%. The  left ventricle has normal function. The left ventricle has no regional  wall motion abnormalities. There  is mild concentric left ventricular  hypertrophy. Left ventricular diastolic  parameters are consistent with Grade I diastolic dysfunction (impaired  relaxation).  2. Right ventricular systolic function is normal. The right ventricular  size is normal.  3. Left atrial size was mildly dilated.  4. The  mitral valve is abnormal. Trivial mitral valve regurgitation.  Moderate mitral annular calcification.  5. The aortic valve is abnormal. There is moderate calcification of the  aortic valve. There is moderate thickening of the aortic valve. Aortic  valve regurgitation is not visualized. Mild aortic valve stenosis.   Assessment / Plan:   1. History of coronary artery disease- Would benefit from coronary artery bypass grafting surgery. Dr. Roxan Hockey to evaluate the patient. Last Plavix dose, per patient, this morning. Plavix now stopped. Will check P2Y12. Dr. Roxan Hockey to determine timing of surgery as will need Plavix washout. 2. History of dyslipidemia-on Atorvastatin 80 mg at hs 3. History of DM-HGA1C 7.6. On Glimepiride 1 mg daily and Metformin 1000 mg bid prior to admission 4. History of hypertension-on Amlodipine 10 mg daily, Carvedilol 25 mg bid, Lisinopril 40 mg daily, Imdur 30 mg bid prior to admission 5. History of remote tobacco abuse   I  spent 20 minutes counseling the patient face to face.   Lars Pinks PA-C 01/21/2020 3:15 PM  Patient seen and examined, chart reviewed, cath images and echo report reviewed. 79 yo man with known CAD with previous PTCA stenting of LAD and circumflex. Presents with new onset angina. At cath has severe 2 vessel CAD involving LAD and circumflex with preserved LV function. CABG indicated for survival benefit and relief of symptoms.  I discussed the general nature of the procedure, the need for general anesthesia, the use of cardiopulmonary bypass, the incisions to be used and the use of draiange tubes postoperatively with Mr. Beth. We discussed the expected hospital stay, overall recovery and short and long term outcomes. I informed him of the indications, risks, benefits and alternatives. He understands the risks include, but are not limited to death, stroke, MI, DVT/PE, bleeding, possible need for transfusion, infections, cardiac  arrhythmias, as well as other organ system dysfunction including respiratory, renal, or GI complications.   He accepts the risks and agrees to proceed.  He was on Plavix including a dose yesterday AM. Timing of CABG to be determined.  Revonda Standard Roxan Hockey, MD [facility] [phone]

## 2020-01-21 NOTE — Progress Notes (Signed)
ANTICOAGULATION CONSULT NOTE - Initial Consult  Pharmacy Consult for heparin Indication: chest pain/ACS  Allergies  Allergen Reactions  . Penicillins Other (See Comments)    Passed out as a child Has patient had a PCN reaction causing immediate rash, facial/tongue/throat swelling, SOB or lightheadedness with hypotension: Unk Has patient had a PCN reaction causing severe rash involving mucus membranes or skin necrosis: Unk Has patient had a PCN reaction that required hospitalization: Unk Has patient had a PCN reaction occurring within the last 10 years: No If all of the above answers are "NO", then may proceed with Cephalosporin use.   . Contrast Media [Iodinated Diagnostic Agents] Other (See Comments)    Had "welts" on arm, and kidney issues after given dye in the past    Patient Measurements: Height: 5\' 8"  (172.7 cm) Weight: 97.1 kg (214 lb) IBW/kg (Calculated) : 68.4 Heparin Dosing Weight: 89 kg  Vital Signs: BP: 143/81 (12/27 0600) Pulse Rate: 72 (12/27 0600)  Labs: Recent Labs    01/20/20 2045 01/20/20 2245  HGB 15.4  --   HCT 45.1  --   PLT 183  --   CREATININE 0.86  --   TROPONINIHS 8 10    Estimated Creatinine Clearance: 80 mL/min (by C-G formula based on SCr of 0.86 mg/dL).   Medical History: Past Medical History:  Diagnosis Date  . Arthritis   . Basal cell carcinoma   . Coronary artery disease    DES to LAD and circumflex April 2014, PTCA ostial circumflex 03/2016 due to ISR, 9/18 PCI/DESx2 osital Lcx (ISR), p/mLCx   . Dyslipidemia   . Essential hypertension   . GERD (gastroesophageal reflux disease)   . Hematuria   . History of blood transfusion 09/2012  . History of kidney stones   . NSTEMI (non-ST elevated myocardial infarction) ([facility]) 08/2012  . Type II diabetes mellitus ([facility])     Medications:  (Not in a hospital admission)   Assessment: Pharmacy consulted to dose heparin in patient with chest pain/ACS.  Patient is not on anticoagulation  prior to admission but received a dose of lovenox 12/27 0026.  CBC WNL  Trop 10  Goal of Therapy:  Heparin level 0.3-0.7 units/ml Monitor platelets by anticoagulation protocol: Yes   Plan:  Give 4000 units bolus x 1 Start heparin infusion at 1100 units/hr Check anti-Xa level in 8 hours and daily while on heparin Continue to monitor H&H and platelets  1/28 Tivis Wherry 01/21/2020,8:46 AM

## 2020-01-21 NOTE — Progress Notes (Signed)
Patient admitted to the hospital overnight by Dr. Robb Matar  He has known coronary artery disease and is being admitted for further work-up of chest pain.  He was seen by cardiology today and plans are to transfer to [facility] for cardiac catheterization.  He will be transferred under the cardiology service.  At this point, TR[facility] will sign off.  Please let us know if we can be of any further assistance.  [employer]

## 2020-01-21 NOTE — Progress Notes (Signed)
  Echocardiogram 2D Echocardiogram has been performed.  Sean Villarreal 01/21/2020, 5:53 PM

## 2020-01-22 ENCOUNTER — Encounter (HOSPITAL_COMMUNITY): Payer: Self-pay | Admitting: Interventional Cardiology

## 2020-01-22 ENCOUNTER — Inpatient Hospital Stay (HOSPITAL_COMMUNITY): Payer: Medicare Other

## 2020-01-22 DIAGNOSIS — I259 Chronic ischemic heart disease, unspecified: Secondary | ICD-10-CM

## 2020-01-22 DIAGNOSIS — E785 Hyperlipidemia, unspecified: Secondary | ICD-10-CM | POA: Diagnosis not present

## 2020-01-22 DIAGNOSIS — Z01810 Encounter for preprocedural cardiovascular examination: Secondary | ICD-10-CM | POA: Diagnosis not present

## 2020-01-22 DIAGNOSIS — I200 Unstable angina: Secondary | ICD-10-CM | POA: Diagnosis not present

## 2020-01-22 LAB — BASIC METABOLIC PANEL
Anion gap: 11 (ref 5–15)
BUN: 11 mg/dL (ref 8–23)
CO2: 24 mmol/L (ref 22–32)
Calcium: 8.2 mg/dL — ABNORMAL LOW (ref 8.9–10.3)
Chloride: 104 mmol/L (ref 98–111)
Creatinine, Ser: 0.81 mg/dL (ref 0.61–1.24)
GFR, Estimated: >60 mL/min (ref >60)
Glucose, Bld: 266 mg/dL — ABNORMAL HIGH (ref 70–99)
Potassium: 3.4 mmol/L — ABNORMAL LOW (ref 3.5–5.1)
Sodium: 139 mmol/L (ref 135–145)

## 2020-01-22 LAB — LIPID PANEL
Cholesterol: 132 mg/dL (ref 0–200)
HDL: 52 mg/dL (ref >40)
LDL Cholesterol: 67 mg/dL (ref 0–99)
Total CHOL/HDL Ratio: 2.5 RATIO
Triglycerides: 67 mg/dL (ref <150)
VLDL: 13 mg/dL (ref 0–40)

## 2020-01-22 LAB — GLUCOSE, CAPILLARY
Glucose-Capillary: 254 mg/dL — ABNORMAL HIGH (ref 70–99)
Glucose-Capillary: 245 mg/dL — ABNORMAL HIGH (ref 70–99)
Glucose-Capillary: 185 mg/dL — ABNORMAL HIGH (ref 70–99)
Glucose-Capillary: 215 mg/dL — ABNORMAL HIGH (ref 70–99)

## 2020-01-22 LAB — CBC
HCT: 42.8 % (ref 39.0–52.0)
Hemoglobin: 14.3 g/dL (ref 13.0–17.0)
MCH: 31.1 pg (ref 26.0–34.0)
MCHC: 33.4 g/dL (ref 30.0–36.0)
MCV: 93 fL (ref 80.0–100.0)
Platelets: 189 10*3/uL (ref 150–400)
RBC: 4.6 MIL/uL (ref 4.22–5.81)
RDW: 12.4 % (ref 11.5–15.5)
WBC: 17.9 10*3/uL — ABNORMAL HIGH (ref 4.0–10.5)
nRBC: 0 % (ref 0.0–0.2)

## 2020-01-22 LAB — HEPARIN LEVEL (UNFRACTIONATED)
Heparin Unfractionated: 0.21 IU/mL — ABNORMAL LOW (ref 0.30–0.70)
Heparin Unfractionated: 0.35 IU/mL (ref 0.30–0.70)

## 2020-01-22 LAB — PLATELET INHIBITION P2Y12: Platelet Function  P2Y12: 235 [PRU] (ref 182–335)

## 2020-01-22 NOTE — Progress Notes (Signed)
Pre-CABG completed.   Please see CV Proc for preliminary results.   Elford Evilsizer, RVT  

## 2020-01-22 NOTE — Progress Notes (Signed)
Inpatient Diabetes Program Recommendations  AACE/ADA: New Consensus Statement on Inpatient Glycemic Control (2015)  Target Ranges:  Prepandial:   less than 140 mg/dL      Peak postprandial:   less than 180 mg/dL (1-2 hours)      Critically ill patients:  140 - 180 mg/dL   Lab Results  Component Value Date   GLUCAP 254 (H) 01/22/2020   HGBA1C 7.6 (H) 01/20/2020    Review of Glycemic Control Results for CHIKE, FARRINGTON (MRN [id number]) as of 01/22/2020 10:45  Ref. Range 01/21/2020 07:35 01/21/2020 15:34 01/21/2020 22:11 01/22/2020 06:36  Glucose-Capillary Latest Ref Range: 70 - 99 mg/dL 734 (H) 287 (H) 681 (H) 254 (H)   Diabetes history: DM 2 Outpatient Diabetes medications:  Amaryl 1 mg daily, Metformin 1000 mg bid Current orders for Inpatient glycemic control:  Novolog moderate tid with meals Amaryl 1 mg q AM  Inpatient Diabetes Program Recommendations:    Note patient received 125 mg Solumedrol on 01/21/20 which likely increased blood sugars.   Consider adding Lantus 15 units daily while in the hospital.    Thanks,  Beryl Meager, RN, BC-ADM Inpatient Diabetes Coordinator Pager [phone] (8a-5p)

## 2020-01-22 NOTE — Progress Notes (Addendum)
Progress Note  Patient Name: Sean Villarreal Date of Encounter: 01/22/2020  [facility] Cardiologist: Nona Dell, MD   Subjective   No chest pain. He had back pain between his shoulder blades last night relieved with tylenol - sounds like his baseline MSK.  Inpatient Medications    Scheduled Meds: . amLODipine  10 mg Oral Daily  . aspirin  81 mg Oral Daily  . atorvastatin  80 mg Oral QHS  . carvedilol  25 mg Oral BID WC  . furosemide  20 mg Oral QODAY  . glimepiride  1 mg Oral Q breakfast  . insulin aspart  0-15 Units Subcutaneous TID WC  . isosorbide mononitrate  30 mg Oral BID  . potassium chloride SA  20 mEq Oral Daily  . sodium chloride flush  3 mL Intravenous Q12H  . tamsulosin  0.4 mg Oral BID   Continuous Infusions: . sodium chloride    . heparin 1,100 Units/hr (01/21/20 2224)   PRN Meds: sodium chloride, acetaminophen, nitroGLYCERIN, ondansetron (ZOFRAN) IV, oxyCODONE, sodium chloride flush   Vital Signs    Vitals:   01/21/20 2005 01/22/20 0000 01/22/20 0400 01/22/20 0555  BP: 129/78 121/70 123/82   Pulse: 92 86 72   Resp: 20 13 16    Temp: 98.5 F (36.9 C) 97.9 F (36.6 C) 98.2 F (36.8 C)   TempSrc: Oral Oral Oral   SpO2: 96% 95% 95%   Weight:    96.3 kg  Height:        Intake/Output Summary (Last 24 hours) at 01/22/2020 0746 Last data filed at 01/22/2020 0743 Gross per 24 hour  Intake 542.48 ml  Output 1900 ml  Net -1357.52 ml   Last 3 Weights 01/22/2020 01/21/2020 01/20/2020  Weight (lbs) 212 lb 4.9 oz 213 lb 13.5 oz 214 lb  Weight (kg) 96.3 kg 97 kg 97.07 kg      Telemetry    Sinus rhythm with HR 70-90s - Personally Reviewed  ECG    No new tracings - Personally Reviewed  Physical Exam   GEN: No acute distress.   Neck: No JVD Cardiac: RRR, 2/6 systolic murmur Respiratory: Clear to auscultation bilaterally. GI: Soft, nontender, non-distended  MS: No edema; No deformity. Neuro:  Nonfocal  Psych: Normal affect    Labs    High Sensitivity Troponin:   Recent Labs  Lab 01/20/20 2045 01/20/20 2245  TROPONINIHS 8 10      Chemistry Recent Labs  Lab 01/20/20 2045  NA 141  K 3.5  CL 101  CO2 27  GLUCOSE 161*  BUN 13  CREATININE 0.86  CALCIUM 9.0  PROT 7.6  ALBUMIN 4.3  AST 20  ALT 23  ALKPHOS 47  BILITOT 0.7  GFRNONAA >60  ANIONGAP 13     Hematology Recent Labs  Lab 01/20/20 2045 01/22/20 0637  WBC 8.8 17.9*  RBC 4.74 4.60  HGB 15.4 14.3  HCT 45.1 42.8  MCV 95.1 93.0  MCH 32.5 31.1  MCHC 34.1 33.4  RDW 12.5 12.4  PLT 183 189    BNPNo results for input(s): BNP, PROBNP in the last 168 hours.   DDimer No results for input(s): DDIMER in the last 168 hours.   Radiology    CARDIAC CATHETERIZATION  Result Date: 01/21/2020  Severe calcific coronary disease involving the left anterior descending, circumflex, and left main.  40 to 50% eccentric distal left main  Bulky eccentric 80% ostial to proximal LAD showing progression compared to 2020.  Moderate mid  to distal diffuse LAD disease.  Diffuse ostial to proximal in-stent restenosis in circumflex which has 2 layers of stent.  Moderate mid to distal circumflex 50% narrowing.  Occlusion of ramus intermedius, very small and fills by left to left collaterals.  Dominant right coronary with luminal irregularities but no high-grade obstruction.  Normal anterior wall motion.  Estimated > EF 50%.  Inferior wall not well seen.  LVEDP is normal. RECOMMENDATIONS:  Discontinue Plavix  IV heparin  Recommend two-vessel coronary bypass surgery in the setting of significant proximal LAD disease and in-stent restenosis x3 of the ostial circumflex (2 layers of stent).  2D Doppler echocardiogram to fully assess LV function.  DG Chest Port 1 View  Result Date: 01/20/2020 CLINICAL DATA:  Chest pain EXAM: PORTABLE CHEST 1 VIEW COMPARISON:  March 31, 2018 FINDINGS: The heart size and mediastinal contours are within normal limits. Both lungs  are clear. The visualized skeletal structures are unremarkable. Aortic calcifications are noted. IMPRESSION: No active disease. Electronically Signed   By: Constance Holster M.D.   On: 01/20/2020 22:13   ECHOCARDIOGRAM COMPLETE  Result Date: 01/21/2020    ECHOCARDIOGRAM REPORT   Patient Name:   Sean Villarreal Date of Exam: 01/21/2020 Medical Rec #:  [id number]         Height:       68.0 in Accession #:    [id number]        Weight:       213.8 lb Date of Birth:  [birth year]          BSA:          2.103 m Patient Age:    79 years          BP:           135/79 mmHg Patient Gender: M                 HR:           90 bpm. Exam Location:  Inpatient Procedure: 2D Echo Indications:    acute diastolic chf  History:        Patient has prior history of Echocardiogram examinations, most                 recent 06/28/2019. CAD, Signs/Symptoms:Chest Pain; Risk                 Factors:Hypertension, Dyslipidemia and Diabetes.  Sonographer:    Johny Chess Referring Phys: [place]  1. Left ventricular ejection fraction, by estimation, is 55 to 60%. The left ventricle has normal function. The left ventricle has no regional wall motion abnormalities. There is mild concentric left ventricular hypertrophy. Left ventricular diastolic parameters are consistent with Grade I diastolic dysfunction (impaired relaxation).  2. Right ventricular systolic function is normal. The right ventricular size is normal.  3. Left atrial size was mildly dilated.  4. The mitral valve is abnormal. Trivial mitral valve regurgitation. Moderate mitral annular calcification.  5. The aortic valve is abnormal. There is moderate calcification of the aortic valve. There is moderate thickening of the aortic valve. Aortic valve regurgitation is not visualized. Mild aortic valve stenosis. Comparison(s): A prior study was performed on 06/28/2019. No significant change from prior study. FINDINGS  Left Ventricle: Left ventricular ejection  fraction, by estimation, is 55 to 60%. The left ventricle has normal function. The left ventricle has no regional wall motion abnormalities. The left ventricular internal cavity size was normal in size. There  is  mild concentric left ventricular hypertrophy. Left ventricular diastolic parameters are consistent with Grade I diastolic dysfunction (impaired relaxation). Right Ventricle: The right ventricular size is normal. No increase in right ventricular wall thickness. Right ventricular systolic function is normal. Left Atrium: Left atrial size was mildly dilated. Right Atrium: Right atrial size was normal in size. Pericardium: There is no evidence of pericardial effusion. Mitral Valve: The mitral valve is abnormal. There is moderate thickening of the mitral valve leaflet(s). There is mild calcification of the mitral valve leaflet(s). Moderate mitral annular calcification. Trivial mitral valve regurgitation. Tricuspid Valve: The tricuspid valve is grossly normal. Tricuspid valve regurgitation is trivial. Aortic Valve: The aortic valve is abnormal. There is moderate calcification of the aortic valve. There is moderate thickening of the aortic valve. Aortic valve regurgitation is not visualized. Mild aortic stenosis is present. Aortic valve mean gradient measures 13.0 mmHg. Aortic valve peak gradient measures 26.8 mmHg. Aortic valve area, by VTI measures 1.35 cm. Pulmonic Valve: The pulmonic valve was not well visualized. Pulmonic valve regurgitation is not visualized. Aorta: The aortic root is normal in size and structure. IAS/Shunts: The interatrial septum was not well visualized.  LEFT VENTRICLE PLAX 2D LVIDd:         4.50 cm  Diastology LVIDs:         3.50 cm  LV e' medial:    7.62 cm/s LV PW:         1.20 cm  LV E/e' medial:  14.3 LV IVS:        1.00 cm  LV e' lateral:   9.36 cm/s LVOT diam:     1.80 cm  LV E/e' lateral: 11.6 LV SV:         65 LV SV Index:   31 LVOT Area:     2.54 cm  RIGHT VENTRICLE              IVC RV S prime:     21.50 cm/s  IVC diam: 2.10 cm TAPSE (M-mode): 3.1 cm LEFT ATRIUM             Index       RIGHT ATRIUM           Index LA diam:        4.00 cm 1.90 cm/m  RA Area:     18.30 cm LA Vol (A2C):   71.9 ml 34.19 ml/m RA Volume:   51.40 ml  24.44 ml/m LA Vol (A4C):   63.9 ml 30.39 ml/m LA Biplane Vol: 69.4 ml 33.00 ml/m  AORTIC VALVE AV Area (Vmax):    1.12 cm AV Area (Vmean):   1.10 cm AV Area (VTI):     1.35 cm AV Vmax:           259.00 cm/s AV Vmean:          167.000 cm/s AV VTI:            0.481 m AV Peak Grad:      26.8 mmHg AV Mean Grad:      13.0 mmHg LVOT Vmax:         114.50 cm/s LVOT Vmean:        72.400 cm/s LVOT VTI:          0.255 m LVOT/AV VTI ratio: 0.53  AORTA Ao Root diam: 3.00 cm MITRAL VALVE MV Area (PHT): 3.39 cm     SHUNTS MV Decel Time: 224 msec     Systemic VTI:  0.26  m MV E velocity: 109.00 cm/s  Systemic Diam: 1.80 cm MV A velocity: 132.00 cm/s MV E/A ratio:  0.83 Rudean Haskell MD Electronically signed by Rudean Haskell MD Signature Date/Time: 01/21/2020/6:17:01 PM    Final     Cardiac Studies   Left heart cath 01/22/20:  Severe calcific coronary disease involving the left anterior descending, circumflex, and left main.  40 to 50% eccentric distal left main  Bulky eccentric 80% ostial to proximal LAD showing progression compared to 2020.  Moderate mid to distal diffuse LAD disease.  Diffuse ostial to proximal in-stent restenosis in circumflex which has 2 layers of stent.  Moderate mid to distal circumflex 50% narrowing.  Occlusion of ramus intermedius, very small and fills by left to left collaterals.  Dominant right coronary with luminal irregularities but no high-grade obstruction.  Normal anterior wall motion.  Estimated > EF 50%.  Inferior wall not well seen.  LVEDP is normal.  RECOMMENDATIONS:   Discontinue Plavix  IV heparin  Recommend two-vessel coronary bypass surgery in the setting of significant proximal LAD disease  and in-stent restenosis x3 of the ostial circumflex (2 layers of stent).  2D Doppler echocardiogram to fully assess LV function.  Diagnostic Dominance: Right     Echo 01/21/20: 1. Left ventricular ejection fraction, by estimation, is 55 to 60%. The  left ventricle has normal function. The left ventricle has no regional  wall motion abnormalities. There is mild concentric left ventricular  hypertrophy. Left ventricular diastolic  parameters are consistent with Grade I diastolic dysfunction (impaired  relaxation).  2. Right ventricular systolic function is normal. The right ventricular  size is normal.  3. Left atrial size was mildly dilated.  4. The mitral valve is abnormal. Trivial mitral valve regurgitation.  Moderate mitral annular calcification.  5. The aortic valve is abnormal. There is moderate calcification of the  aortic valve. There is moderate thickening of the aortic valve. Aortic  valve regurgitation is not visualized. Mild aortic valve stenosis.   Patient Profile     79 y.o. male with past medical history of CAD (s/p DES to LAD and LCx in 04/2012, PTCA to ostial LCx in 03/2016 due to ISR, DES to ostial LCx in 09/2016 due to ISR, patent stents by repeat cath in 03/2018), mild AS, HTN, HLD and Type 2 DM who is being seen today for the evaluation of chest pain  Assessment & Plan    Chest pain CAD s/p DES to LAD and LCx (04/2012), PTCA to ostial LCx (03/2016) for ISR, DES to ostial LCx (09/2016) - patent stents on repeat heart cath 03/2018 - pt has been having worsening chest pain since 11/2019 - no improvement with imdur - given progression of symptoms, he underwent left heart cath yesterday which revealed severe calcific CAD in the left main, LAD, and LCx - CT surgery was consulted for CABG pending plavix washout  - continue ASA, heparin gtt   Aortic stenosis - stable mild AS on echo yesterday   Hypertension - continue amlodipine, coreg, and imdur - ACEI on  hold for cath and CABG - pressure well-controlled on present regimen   Hyperlipidemia with LDL goal < 70 - LDL 46 in 12/2018 - continue 80 mg lipitor   DM2 - A1c 7.6% - metformin on hold,continue glimepiride  - SSI   Chronic diastolic dysfunction - grade 1 DD - he takes 20 mg lasix every other day at home  - appears euvolemic on exam - continue regimen prior to CABG  Await further recommendations from CT surgery.   For questions or updates, please contact [place] Please consult [email or web address] for contact info under      Signed, Ledora Bottcher, PA  01/22/2020, 7:46 AM     Patient seen and examined. Agree with assessment and plan. Cath images reviewed. No recurrent chest pain on iv heparin.R groin cath site stable without hematoma or ecchymosis. No JVD, no rales, RRR 2/6 systolic murmur. No significant edema.  Pt was seen by Dr. Roxan Hockey, undergoing Plavix washout. Consider P2Y12 test on Thursday to see if ? Friday vs Monday.   Troy Sine, MD, [facility] 01/22/2020 12:35 PM

## 2020-01-22 NOTE — Progress Notes (Signed)
ANTICOAGULATION CONSULT NOTE  Pharmacy Consult for Heparin Indication: chest pain/ACS  Allergies  Allergen Reactions  . Penicillins Other (See Comments)    Passed out as a child Has patient had a PCN reaction causing immediate rash, facial/tongue/throat swelling, SOB or lightheadedness with hypotension: Unk Has patient had a PCN reaction causing severe rash involving mucus membranes or skin necrosis: Unk Has patient had a PCN reaction that required hospitalization: Unk Has patient had a PCN reaction occurring within the last 10 years: No If all of the above answers are "NO", then may proceed with Cephalosporin use.   . Contrast Media [Iodinated Diagnostic Agents] Other (See Comments)    Had "welts" on arm, and kidney issues after given dye in the past    Patient Measurements: Height: 5\' 8"  (172.7 cm) Weight: 96.3 kg (212 lb 4.9 oz) IBW/kg (Calculated) : 68.4 Heparin Dosing Weight: 89 kg   Vital Signs: Temp: 98.2 F (36.8 C) (12/28 0400) Temp Source: Oral (12/28 0400) BP: 123/82 (12/28 0400) Pulse Rate: 72 (12/28 0400)  Labs: Recent Labs    01/20/20 2045 01/20/20 2245 01/22/20 0637  HGB 15.4  --  14.3  HCT 45.1  --  42.8  PLT 183  --  189  HEPARINUNFRC  --   --  0.21*  CREATININE 0.86  --   --   TROPONINIHS 8 10  --     Estimated Creatinine Clearance: 79.7 mL/min (by C-G formula based on SCr of 0.86 mg/dL).   Medical History: Past Medical History:  Diagnosis Date  . Arthritis   . Basal cell carcinoma   . Coronary artery disease    DES to LAD and circumflex April 2014, PTCA ostial circumflex 03/2016 due to ISR, 9/18 PCI/DESx2 osital Lcx (ISR), p/mLCx   . Dyslipidemia   . Essential hypertension   . GERD (gastroesophageal reflux disease)   . Hematuria   . History of blood transfusion 09/2012  . History of kidney stones   . NSTEMI (non-ST elevated myocardial infarction) (HCC) 08/2012  . Type II diabetes mellitus [facility])      Assessment: 79 yo male presented  on 01/20/2020 with chest pain. Patient had LHC today and found severe coronary disease in LAD, circumflex and left main with recommendation for 2v CABG. Pharmacy consulted to resume heparin post cath.   Heparin level this morning is below goal (0.21) on 1100 units/hr. CBC stable. No bleeding issues noted.   Goal of Therapy:  Heparin level 0.3-0.7 units/ml Monitor platelets by anticoagulation protocol: Yes   Plan:  Increase heparin to 1300 units/hr Check heparin level this afternoon Monitor heparin level, CBC and S/S of bleeding daily  Awaiting plavix washout-p2y12 in process this morning  01/22/2020 PharmD., BCPS Clinical Pharmacist 01/22/2020 7:58 AM

## 2020-01-22 NOTE — Progress Notes (Signed)
ANTICOAGULATION CONSULT NOTE  Pharmacy Consult for Heparin Indication: chest pain/ACS  Allergies  Allergen Reactions  . Penicillins Other (See Comments)    Passed out as a child Has patient had a PCN reaction causing immediate rash, facial/tongue/throat swelling, SOB or lightheadedness with hypotension: Unk Has patient had a PCN reaction causing severe rash involving mucus membranes or skin necrosis: Unk Has patient had a PCN reaction that required hospitalization: Unk Has patient had a PCN reaction occurring within the last 10 years: No If all of the above answers are "NO", then may proceed with Cephalosporin use.   . Contrast Media [Iodinated Diagnostic Agents] Other (See Comments)    Had "welts" on arm, and kidney issues after given dye in the past    Patient Measurements: Height: 5\' 8"  (172.7 cm) Weight: 96.3 kg (212 lb 4.9 oz) IBW/kg (Calculated) : 68.4 Heparin Dosing Weight: 89 kg   Vital Signs: Temp: 97.7 F (36.5 C) (12/28 1625) Temp Source: Oral (12/28 1625) Pulse Rate: 79 (12/28 1625)  Labs: Recent Labs    01/20/20 2045 01/20/20 2245 01/22/20 0637 01/22/20 1808  HGB 15.4  --  14.3  --   HCT 45.1  --  42.8  --   PLT 183  --  189  --   HEPARINUNFRC  --   --  0.21* 0.35  CREATININE 0.86  --  0.81  --   TROPONINIHS 8 10  --   --     Estimated Creatinine Clearance: 84.6 mL/min (by C-G formula based on SCr of 0.81 mg/dL).   Medical History: Past Medical History:  Diagnosis Date  . Arthritis   . Basal cell carcinoma   . Coronary artery disease    DES to LAD and circumflex April 2014, PTCA ostial circumflex 03/2016 due to ISR, 9/18 PCI/DESx2 osital Lcx (ISR), p/mLCx   . Dyslipidemia   . Essential hypertension   . GERD (gastroesophageal reflux disease)   . Hematuria   . History of blood transfusion 09/2012  . History of kidney stones   . NSTEMI (non-ST elevated myocardial infarction) (HCC) 08/2012  . Type II diabetes mellitus [facility])       Assessment: 79 yr old male presented on 01/20/2020 with chest pain. Patient had LHC on 12/27/2, which found severe coronary disease in LAD, circumflex and left main with recommendation for 2v CABG. Pharmacy was consulted to resume heparin post cath.  Heparin level ~10 hrs after heparin infusion was decreased to 1300 units/hr was 0.35 units/ml, which is within the goal range for this pt. H/H, platelets WNL. Per RN, no issues with IV or bleeding observed.  Platelet inhibition p2y12: 235 PRU  Goal of Therapy:  Heparin level 0.3-0.7 units/ml Monitor platelets by anticoagulation protocol: Yes   Plan:  Continue heparin at 1300 units/hr Check confirmatory heparin level in 8 hrs Monitor heparin level, CBC and S/S of bleeding daily  Awaiting plavix washout (see p2y12 value above)   12-09-1989, PharmD, BCPS, [facility] Clinical Pharmacist 01/22/2020 6:57 PM

## 2020-01-23 DIAGNOSIS — E785 Hyperlipidemia, unspecified: Secondary | ICD-10-CM | POA: Diagnosis not present

## 2020-01-23 DIAGNOSIS — I25119 Atherosclerotic heart disease of native coronary artery with unspecified angina pectoris: Secondary | ICD-10-CM

## 2020-01-23 DIAGNOSIS — E1159 Type 2 diabetes mellitus with other circulatory complications: Secondary | ICD-10-CM

## 2020-01-23 DIAGNOSIS — I259 Chronic ischemic heart disease, unspecified: Secondary | ICD-10-CM | POA: Diagnosis not present

## 2020-01-23 LAB — SURGICAL PCR SCREEN
MRSA, PCR: NEGATIVE
Staphylococcus aureus: NEGATIVE

## 2020-01-23 LAB — CBC
HCT: 40.4 % (ref 39.0–52.0)
Hemoglobin: 13.7 g/dL (ref 13.0–17.0)
MCH: 31.6 pg (ref 26.0–34.0)
MCHC: 33.9 g/dL (ref 30.0–36.0)
MCV: 93.3 fL (ref 80.0–100.0)
Platelets: 162 10*3/uL (ref 150–400)
RBC: 4.33 MIL/uL (ref 4.22–5.81)
RDW: 12.4 % (ref 11.5–15.5)
WBC: 14 10*3/uL — ABNORMAL HIGH (ref 4.0–10.5)
nRBC: 0 % (ref 0.0–0.2)

## 2020-01-23 LAB — GLUCOSE, CAPILLARY
Glucose-Capillary: 180 mg/dL — ABNORMAL HIGH (ref 70–99)
Glucose-Capillary: 237 mg/dL — ABNORMAL HIGH (ref 70–99)
Glucose-Capillary: 187 mg/dL — ABNORMAL HIGH (ref 70–99)
Glucose-Capillary: 212 mg/dL — ABNORMAL HIGH (ref 70–99)

## 2020-01-23 LAB — URINALYSIS, ROUTINE W REFLEX MICROSCOPIC
Bilirubin Urine: NEGATIVE
Glucose, UA: 150 mg/dL — AB
Hgb urine dipstick: NEGATIVE
Ketones, ur: NEGATIVE mg/dL
Leukocytes,Ua: NEGATIVE
Nitrite: NEGATIVE
Protein, ur: NEGATIVE mg/dL
Specific Gravity, Urine: 1.014 (ref 1.005–1.030)
pH: 7 (ref 5.0–8.0)

## 2020-01-23 LAB — TYPE AND SCREEN
ABO/RH(D): A POS
Antibody Screen: NEGATIVE

## 2020-01-23 LAB — HEPARIN LEVEL (UNFRACTIONATED)
Heparin Unfractionated: 0.24 IU/mL — ABNORMAL LOW (ref 0.30–0.70)
Heparin Unfractionated: 0.49 IU/mL (ref 0.30–0.70)
Heparin Unfractionated: 0.48 IU/mL (ref 0.30–0.70)

## 2020-01-23 LAB — ABO/RH: ABO/RH(D): A POS

## 2020-01-23 MED ORDER — TEMAZEPAM 15 MG PO CAPS
15.0000 mg | ORAL_CAPSULE | Freq: Once | ORAL | Status: AC | PRN
  Administered 2020-01-23: 21:00:00 15 mg via ORAL
  Filled 2020-01-23: qty 1

## 2020-01-23 MED ORDER — TRANEXAMIC ACID (OHS) PUMP PRIME SOLUTION
2.0000 mg/kg | INTRAVENOUS | Status: DC
  Filled 2020-01-23: qty 1.9

## 2020-01-23 MED ORDER — MILRINONE LACTATE IN DEXTROSE 20-5 MG/100ML-% IV SOLN
0.3000 ug/kg/min | INTRAVENOUS | Status: DC
  Filled 2020-01-23: qty 100

## 2020-01-23 MED ORDER — CHLORHEXIDINE GLUCONATE CLOTH 2 % EX PADS
6.0000 | MEDICATED_PAD | Freq: Once | CUTANEOUS | Status: AC
  Administered 2020-01-23: 21:00:00 6 via TOPICAL

## 2020-01-23 MED ORDER — DIAZEPAM 2 MG PO TABS
2.0000 mg | ORAL_TABLET | Freq: Once | ORAL | Status: AC
  Administered 2020-01-24: 05:00:00 2 mg via ORAL
  Filled 2020-01-23: qty 1

## 2020-01-23 MED ORDER — EPINEPHRINE HCL 5 MG/250ML IV SOLN IN NS
0.0000 ug/min | INTRAVENOUS | Status: DC
  Filled 2020-01-23: qty 250

## 2020-01-23 MED ORDER — NITROGLYCERIN IN D5W 200-5 MCG/ML-% IV SOLN
2.0000 ug/min | INTRAVENOUS | Status: DC
  Filled 2020-01-23: qty 250

## 2020-01-23 MED ORDER — LEVOFLOXACIN IN D5W 500 MG/100ML IV SOLN
500.0000 mg | INTRAVENOUS | Status: AC
  Administered 2020-01-24: 07:00:00 500 mg via INTRAVENOUS
  Filled 2020-01-23: qty 100

## 2020-01-23 MED ORDER — CHLORHEXIDINE GLUCONATE CLOTH 2 % EX PADS
6.0000 | MEDICATED_PAD | Freq: Once | CUTANEOUS | Status: AC
  Administered 2020-01-23: 17:00:00 6 via TOPICAL

## 2020-01-23 MED ORDER — BISACODYL 5 MG PO TBEC: 5.0000 mg | DELAYED_RELEASE_TABLET | Freq: Once | ORAL | Status: DC

## 2020-01-23 MED ORDER — DEXMEDETOMIDINE HCL IN NACL 400 MCG/100ML IV SOLN
0.1000 ug/kg/h | INTRAVENOUS | Status: AC
  Administered 2020-01-24: 09:00:00 .3 ug/kg/h via INTRAVENOUS
  Filled 2020-01-23: qty 100

## 2020-01-23 MED ORDER — VANCOMYCIN HCL 1500 MG/300ML IV SOLN
1500.0000 mg | INTRAVENOUS | Status: AC
  Administered 2020-01-24: 07:00:00 1500 mg via INTRAVENOUS
  Filled 2020-01-23: qty 300

## 2020-01-23 MED ORDER — PHENYLEPHRINE HCL-NACL 20-0.9 MG/250ML-% IV SOLN
30.0000 ug/min | INTRAVENOUS | Status: AC
  Administered 2020-01-24: 08:00:00 25 ug/min via INTRAVENOUS
  Filled 2020-01-23: qty 250

## 2020-01-23 MED ORDER — TRANEXAMIC ACID 1000 MG/10ML IV SOLN
1.5000 mg/kg/h | INTRAVENOUS | Status: AC
  Administered 2020-01-24: 08:00:00 15 mg/kg/h via INTRAVENOUS
  Filled 2020-01-23: qty 25

## 2020-01-23 MED ORDER — SODIUM CHLORIDE 0.9 % IV SOLN
INTRAVENOUS | Status: DC
  Filled 2020-01-23: qty 30

## 2020-01-23 MED ORDER — TRANEXAMIC ACID (OHS) BOLUS VIA INFUSION
15.0000 mg/kg | INTRAVENOUS | Status: DC
  Filled 2020-01-23: qty 1427

## 2020-01-23 MED ORDER — NOREPINEPHRINE 4 MG/250ML-% IV SOLN
0.0000 ug/min | INTRAVENOUS | Status: DC
  Filled 2020-01-23: qty 250

## 2020-01-23 MED ORDER — PLASMA-LYTE 148 IV SOLN
INTRAVENOUS | Status: DC
  Filled 2020-01-23: qty 2.5

## 2020-01-23 MED ORDER — POTASSIUM CHLORIDE 2 MEQ/ML IV SOLN
80.0000 meq | INTRAVENOUS | Status: DC
  Filled 2020-01-23: qty 40

## 2020-01-23 MED ORDER — INSULIN REGULAR(HUMAN) IN NACL 100-0.9 UT/100ML-% IV SOLN
INTRAVENOUS | Status: AC
  Administered 2020-01-24: 08:00:00 1 [IU]/h via INTRAVENOUS
  Filled 2020-01-23: qty 100

## 2020-01-23 MED ORDER — METOPROLOL TARTRATE 12.5 MG HALF TABLET
12.5000 mg | ORAL_TABLET | Freq: Once | ORAL | Status: AC
  Administered 2020-01-24: 05:00:00 12.5 mg via ORAL
  Filled 2020-01-23: qty 1

## 2020-01-23 MED ORDER — MAGNESIUM SULFATE 50 % IJ SOLN
40.0000 meq | INTRAMUSCULAR | Status: DC
  Filled 2020-01-23: qty 9.85

## 2020-01-23 MED ORDER — CHLORHEXIDINE GLUCONATE 0.12 % MT SOLN
15.0000 mL | Freq: Once | OROMUCOSAL | Status: AC
  Administered 2020-01-24: 05:00:00 15 mL via OROMUCOSAL
  Filled 2020-01-23: qty 15

## 2020-01-23 NOTE — Progress Notes (Addendum)
Progress Note  Patient Name: Sean Villarreal Date of Encounter: 01/23/2020  [facility] Cardiologist: Rozann Lesches, MD   Subjective   Doing well this morning. Some mild pain between his shoulder blades relieved with tylenol.  Inpatient Medications    Scheduled Meds: . amLODipine  10 mg Oral Daily  . aspirin  81 mg Oral Daily  . atorvastatin  80 mg Oral QHS  . carvedilol  25 mg Oral BID WC  . furosemide  20 mg Oral QODAY  . glimepiride  1 mg Oral Q breakfast  . insulin aspart  0-15 Units Subcutaneous TID WC  . isosorbide mononitrate  30 mg Oral BID  . potassium chloride SA  20 mEq Oral Daily  . sodium chloride flush  3 mL Intravenous Q12H  . tamsulosin  0.4 mg Oral BID   Continuous Infusions: . sodium chloride    . heparin 1,500 Units/hr (01/23/20 0514)   PRN Meds: sodium chloride, acetaminophen, nitroGLYCERIN, ondansetron (ZOFRAN) IV, oxyCODONE, sodium chloride flush   Vital Signs    Vitals:   01/22/20 0555 01/22/20 1625 01/22/20 1947 01/23/20 0331  BP:   133/77 130/76  Pulse:  79  76  Resp:   19 17  Temp:  97.7 F (36.5 C) 97.9 F (36.6 C) 98.2 F (36.8 C)  TempSrc:  Oral Oral Oral  SpO2:  99% 98% 97%  Weight: 96.3 kg   95.1 kg  Height:        Intake/Output Summary (Last 24 hours) at 01/23/2020 0931 Last data filed at 01/23/2020 [id number] Gross per 24 hour  Intake 960 ml  Output 1700 ml  Net -740 ml   Last 3 Weights 01/23/2020 01/22/2020 01/21/2020  Weight (lbs) 209 lb 11.2 oz 212 lb 4.9 oz 213 lb 13.5 oz  Weight (kg) 95.119 kg 96.3 kg 97 kg      Telemetry    SR, 4 beat NSVT - Personally Reviewed  ECG    No new tracing  Physical Exam   GEN: No acute distress.   Neck: No JVD Cardiac: RRR, soft systolic murmur, no rubs, or gallops.  Respiratory: Clear to auscultation bilaterally. GI: Soft, nontender, non-distended  MS: No edema; No deformity. Neuro:  Nonfocal  Psych: Normal affect   Labs    High Sensitivity Troponin:   Recent  Labs  Lab 01/20/20 2045 01/20/20 2245  TROPONINIHS 8 10      Chemistry Recent Labs  Lab 01/20/20 2045 01/22/20 0637  NA 141 139  K 3.5 3.4*  CL 101 104  CO2 27 24  GLUCOSE 161* 266*  BUN 13 11  CREATININE 0.86 0.81  CALCIUM 9.0 8.2*  PROT 7.6  --   ALBUMIN 4.3  --   AST 20  --   ALT 23  --   ALKPHOS 47  --   BILITOT 0.7  --   GFRNONAA >60 >60  ANIONGAP 13 11     Hematology Recent Labs  Lab 01/20/20 2045 01/22/20 0637 01/23/20 0353  WBC 8.8 17.9* 14.0*  RBC 4.74 4.60 4.33  HGB 15.4 14.3 13.7  HCT 45.1 42.8 40.4  MCV 95.1 93.0 93.3  MCH 32.5 31.1 31.6  MCHC 34.1 33.4 33.9  RDW 12.5 12.4 12.4  PLT 183 189 162    BNPNo results for input(s): BNP, PROBNP in the last 168 hours.   DDimer No results for input(s): DDIMER in the last 168 hours.   Radiology    CARDIAC CATHETERIZATION  Result Date: 01/21/2020  Severe calcific coronary disease involving the left anterior descending, circumflex, and left main.  40 to 50% eccentric distal left main  Bulky eccentric 80% ostial to proximal LAD showing progression compared to 2020.  Moderate mid to distal diffuse LAD disease.  Diffuse ostial to proximal in-stent restenosis in circumflex which has 2 layers of stent.  Moderate mid to distal circumflex 50% narrowing.  Occlusion of ramus intermedius, very small and fills by left to left collaterals.  Dominant right coronary with luminal irregularities but no high-grade obstruction.  Normal anterior wall motion.  Estimated > EF 50%.  Inferior wall not well seen.  LVEDP is normal. RECOMMENDATIONS:  Discontinue Plavix  IV heparin  Recommend two-vessel coronary bypass surgery in the setting of significant proximal LAD disease and in-stent restenosis x3 of the ostial circumflex (2 layers of stent).  2D Doppler echocardiogram to fully assess LV function.  ECHOCARDIOGRAM COMPLETE  Result Date: 01/21/2020    ECHOCARDIOGRAM REPORT   Patient Name:   Sean Villarreal Date of  Exam: 01/21/2020 Medical Rec #:  [id number]         Height:       68.0 in Accession #:    [id number]        Weight:       213.8 lb Date of Birth:  [birth year]          BSA:          2.103 m Patient Age:    79 years          BP:           135/79 mmHg Patient Gender: M                 HR:           90 bpm. Exam Location:  Inpatient Procedure: 2D Echo Indications:    acute diastolic chf  History:        Patient has prior history of Echocardiogram examinations, most                 recent 06/28/2019. CAD, Signs/Symptoms:Chest Pain; Risk                 Factors:Hypertension, Dyslipidemia and Diabetes.  Sonographer:    Delcie Roch Referring Phys: [phone] Barry Dienes SMITH IMPRESSIONS  1. Left ventricular ejection fraction, by estimation, is 55 to 60%. The left ventricle has normal function. The left ventricle has no regional wall motion abnormalities. There is mild concentric left ventricular hypertrophy. Left ventricular diastolic parameters are consistent with Grade I diastolic dysfunction (impaired relaxation).  2. Right ventricular systolic function is normal. The right ventricular size is normal.  3. Left atrial size was mildly dilated.  4. The mitral valve is abnormal. Trivial mitral valve regurgitation. Moderate mitral annular calcification.  5. The aortic valve is abnormal. There is moderate calcification of the aortic valve. There is moderate thickening of the aortic valve. Aortic valve regurgitation is not visualized. Mild aortic valve stenosis. Comparison(s): A prior study was performed on 06/28/2019. No significant change from prior study. FINDINGS  Left Ventricle: Left ventricular ejection fraction, by estimation, is 55 to 60%. The left ventricle has normal function. The left ventricle has no regional wall motion abnormalities. The left ventricular internal cavity size was normal in size. There is  mild concentric left ventricular hypertrophy. Left ventricular diastolic parameters are consistent with Grade I  diastolic dysfunction (impaired relaxation). Right Ventricle: The right ventricular size is normal. No increase in right  ventricular wall thickness. Right ventricular systolic function is normal. Left Atrium: Left atrial size was mildly dilated. Right Atrium: Right atrial size was normal in size. Pericardium: There is no evidence of pericardial effusion. Mitral Valve: The mitral valve is abnormal. There is moderate thickening of the mitral valve leaflet(s). There is mild calcification of the mitral valve leaflet(s). Moderate mitral annular calcification. Trivial mitral valve regurgitation. Tricuspid Valve: The tricuspid valve is grossly normal. Tricuspid valve regurgitation is trivial. Aortic Valve: The aortic valve is abnormal. There is moderate calcification of the aortic valve. There is moderate thickening of the aortic valve. Aortic valve regurgitation is not visualized. Mild aortic stenosis is present. Aortic valve mean gradient measures 13.0 mmHg. Aortic valve peak gradient measures 26.8 mmHg. Aortic valve area, by VTI measures 1.35 cm. Pulmonic Valve: The pulmonic valve was not well visualized. Pulmonic valve regurgitation is not visualized. Aorta: The aortic root is normal in size and structure. IAS/Shunts: The interatrial septum was not well visualized.  LEFT VENTRICLE PLAX 2D LVIDd:         4.50 cm  Diastology LVIDs:         3.50 cm  LV e' medial:    7.62 cm/s LV PW:         1.20 cm  LV E/e' medial:  14.3 LV IVS:        1.00 cm  LV e' lateral:   9.36 cm/s LVOT diam:     1.80 cm  LV E/e' lateral: 11.6 LV SV:         65 LV SV Index:   31 LVOT Area:     2.54 cm  RIGHT VENTRICLE             IVC RV S prime:     21.50 cm/s  IVC diam: 2.10 cm TAPSE (M-mode): 3.1 cm LEFT ATRIUM             Index       RIGHT ATRIUM           Index LA diam:        4.00 cm 1.90 cm/m  RA Area:     18.30 cm LA Vol (A2C):   71.9 ml 34.19 ml/m RA Volume:   51.40 ml  24.44 ml/m LA Vol (A4C):   63.9 ml 30.39 ml/m LA Biplane Vol:  69.4 ml 33.00 ml/m  AORTIC VALVE AV Area (Vmax):    1.12 cm AV Area (Vmean):   1.10 cm AV Area (VTI):     1.35 cm AV Vmax:           259.00 cm/s AV Vmean:          167.000 cm/s AV VTI:            0.481 m AV Peak Grad:      26.8 mmHg AV Mean Grad:      13.0 mmHg LVOT Vmax:         114.50 cm/s LVOT Vmean:        72.400 cm/s LVOT VTI:          0.255 m LVOT/AV VTI ratio: 0.53  AORTA Ao Root diam: 3.00 cm MITRAL VALVE MV Area (PHT): 3.39 cm     SHUNTS MV Decel Time: 224 msec     Systemic VTI:  0.26 m MV E velocity: 109.00 cm/s  Systemic Diam: 1.80 cm MV A velocity: 132.00 cm/s MV E/A ratio:  0.83 Rudean Haskell MD Electronically signed by Rudean Haskell MD Signature Date/Time:  01/21/2020/6:17:01 PM    Final    VAS US DOPPLER PRE CABG  Result Date: 01/22/2020 PREOPERATIVE VASCULAR EVALUATION  Indications:  Pre-CABG. Risk Factors: Hypertension, Diabetes, coronary artery disease. Performing Technologist: Vonzell Schlatter RVT  Examination Guidelines: A complete evaluation includes B-mode imaging, spectral Doppler, color Doppler, and power Doppler as needed of all accessible portions of each vessel. Bilateral testing is considered an integral part of a complete examination. Limited examinations for reoccurring indications may be performed as noted.  Right Carotid Findings: +----------+--------+--------+--------+------------+--------+           PSV cm/sEDV cm/sStenosisDescribe    Comments +----------+--------+--------+--------+------------+--------+ CCA Prox  81      13                                   +----------+--------+--------+--------+------------+--------+ CCA Distal91      16                                   +----------+--------+--------+--------+------------+--------+ ICA Prox  58      13      1-39%   heterogenous         +----------+--------+--------+--------+------------+--------+ ICA Distal71      19                                    +----------+--------+--------+--------+------------+--------+ ECA       132                                          +----------+--------+--------+--------+------------+--------+ Portions of this table do not appear on this page. +----------+--------+-------+--------+------------+           PSV cm/sEDV cmsDescribeArm Pressure +----------+--------+-------+--------+------------+ Subclavian78                                  +----------+--------+-------+--------+------------+ +---------+--------+--+--------+--+ VertebralPSV cm/s35EDV cm/s10 +---------+--------+--+--------+--+ Left Carotid Findings: +----------+--------+--------+--------+------------+--------+           PSV cm/sEDV cm/sStenosisDescribe    Comments +----------+--------+--------+--------+------------+--------+ CCA Prox  86      11                                   +----------+--------+--------+--------+------------+--------+ CCA Distal87      16                                   +----------+--------+--------+--------+------------+--------+ ICA Prox  69      18      1-39%   heterogenous         +----------+--------+--------+--------+------------+--------+ ICA Distal60      18                                   +----------+--------+--------+--------+------------+--------+ ECA       107                                          +----------+--------+--------+--------+------------+--------+ +----------+--------+--------+--------+------------+  SubclavianPSV cm/sEDV cm/sDescribeArm Pressure +----------+--------+--------+--------+------------+           99                                   +----------+--------+--------+--------+------------+ +---------+--------+--+--------+--+ VertebralPSV cm/s60EDV cm/s13 +---------+--------+--+--------+--+  ABI Findings: +---------+------------------+-----+---------+--------+ Right    Rt Pressure (mmHg)IndexWaveform Comment   +---------+------------------+-----+---------+--------+ Brachial 125                    triphasic         +---------+------------------+-----+---------+--------+ PTA      199               1.59 triphasic         +---------+------------------+-----+---------+--------+ DP       188               1.50 biphasic          +---------+------------------+-----+---------+--------+ Great Toe120               0.96 Normal            +---------+------------------+-----+---------+--------+ +---------+------------------+-----+---------+-------+ Left     Lt Pressure (mmHg)IndexWaveform Comment +---------+------------------+-----+---------+-------+ Brachial 119                    triphasic        +---------+------------------+-----+---------+-------+ PTA      199               1.59 triphasic        +---------+------------------+-----+---------+-------+ DP       183               1.46 triphasic        +---------+------------------+-----+---------+-------+ Georgeanna Harrison               1.10 Normal           +---------+------------------+-----+---------+-------+ Arterial wall calcification precludes accurate ankle pressures and ABIs.  Right Doppler Findings: +-----------+--------+-----+---------+--------+ Site       PressureIndexDoppler  Comments +-----------+--------+-----+---------+--------+ Brachial   125          triphasic         +-----------+--------+-----+---------+--------+ Radial                  triphasic         +-----------+--------+-----+---------+--------+ Ulnar                   triphasic         +-----------+--------+-----+---------+--------+ Palmar Arch             triphasic         +-----------+--------+-----+---------+--------+  Left Doppler Findings: +-----------+--------+-----+---------+--------+ Site       PressureIndexDoppler  Comments +-----------+--------+-----+---------+--------+ Brachial   119          triphasic          +-----------+--------+-----+---------+--------+ Radial                  triphasic         +-----------+--------+-----+---------+--------+ Ulnar                   triphasic         +-----------+--------+-----+---------+--------+ Palmar Arch             triphasic         +-----------+--------+-----+---------+--------+  Summary: Right Carotid: Velocities in the right ICA are consistent with a 1-39% stenosis. Left Carotid: Velocities in  the left ICA are consistent with a 1-39% stenosis. Vertebrals:  Bilateral vertebral arteries demonstrate antegrade flow. Subclavians: Normal flow hemodynamics were seen in bilateral subclavian              arteries. Right ABI: The right toe-brachial index is normal. ABIs are unreliable. RT great toe pressure = 120 mmHg. Left ABI: The left toe-brachial index is normal. ABIs are unreliable. LT Great toe pressure = 138 mmHg. Right Upper Extremity: Normal PPG waveforms with radial artery compression suggest palmar arch patency. Doppler waveforms remain within normal limits with right radial compression. Doppler waveforms remain within normal limits with right ulnar compression. Left Upper Extremity: Normal PPG waveforms with radial artery compression suggest palmar arch patency. Doppler waveforms remain within normal limits with left radial compression. Doppler waveforms remain within normal limits with left ulnar compression.  Electronically signed by Servando Snare MD on 01/22/2020 at 2:19:35 PM.    Final    Cardiac Studies   Left heart cath 01/22/20:  Severe calcific coronary disease involving the left anterior descending, circumflex, and left main.  40 to 50% eccentric distal left main  Bulky eccentric 80% ostial to proximal LAD showing progression compared to 2020. Moderate mid to distal diffuse LAD disease.  Diffuse ostial to proximal in-stent restenosis in circumflex which has 2 layers of stent. Moderate mid to distal circumflex 50%  narrowing.  Occlusion of ramus intermedius, very small and fills by left to left collaterals.  Dominant right coronary with luminal irregularities but no high-grade obstruction.  Normal anterior wall motion. Estimated >EF 50%. Inferior wall not well seen. LVEDP is normal.  RECOMMENDATIONS:   Discontinue Plavix  IV heparin  Recommend two-vessel coronary bypass surgery in the setting of significant proximal LAD disease and in-stent restenosis x3 of the ostial circumflex (2 layers of stent).  2D Doppler echocardiogram to fully assess LV function.  Diagnostic Dominance: Right     Echo 01/21/20: 1. Left ventricular ejection fraction, by estimation, is 55 to 60%. The  left ventricle has normal function. The left ventricle has no regional  wall motion abnormalities. There is mild concentric left ventricular  hypertrophy. Left ventricular diastolic  parameters are consistent with Grade I diastolic dysfunction (impaired  relaxation).  2. Right ventricular systolic function is normal. The right ventricular  size is normal.  3. Left atrial size was mildly dilated.  4. The mitral valve is abnormal. Trivial mitral valve regurgitation.  Moderate mitral annular calcification.  5. The aortic valve is abnormal. There is moderate calcification of the  aortic valve. There is moderate thickening of the aortic valve. Aortic  valve regurgitation is not visualized. Mild aortic valve stenosis.   Patient Profile     79 y.o. male with past medical history of CAD (s/p DES to LAD and LCx in 04/2012, PTCA to ostial LCx in 03/2016 due to ISR, DES to ostial LCx in 09/2016 due to ISR, patent stents by repeat cath in 03/2018), mild AS, HTN, HLD and Type 2 DMwho was being seen for the evaluation of chest pain.   Assessment & Plan    1. Chest pain,CAD s/p DES to LAD and LCx (04/2012), PTCA to ostial LCx (03/2016) for ISR, DES to ostial LCx (09/2016): patent stents on repeat heart cath  03/2018. He had been having worsening chest pain and underwent cardiac cath with severe calcific CAD in the LM, LAD and Lcx. TCTS consulted and seen by Dr. Roxan Hockey, timing of surgery TBD. No chest pain overnight. P2Y12 235 yesterday. --  continue ASA, statin, Coreg, Imdur and IV heparin  2. Aortic stenosis: --stable mild AS on echo this admission  3. Hypertension: continue amlodipine, coreg, and imdur -- ACEI on hold for cath and CABG  4. Hyperlipidemia with LDL goal < 70: LDL 46 in 12/2018 -- continue 80 mg lipitor  5. DM2: A1c 7.6% -- metformin on hold, continue glimepiride  -- SSI  6. Chronic diastolic dysfunction: grade 1 DD. Appears euvolemic on exam -- he takes 20 mg lasix every other day at home   For questions or updates, please contact [place] Please consult [email or web address] for contact info under        Signed, Reino Bellis, NP  01/23/2020, 9:31 AM     Patient seen and examined. Agree with assessment and plan.  No recurrent anginal symptoms.  Telemetry reveals sinus rhythm in the 70s.  He needs to be on heparin intravenously pending CABG revascularization for severe multivessel CAD.  Consider P2 Y12 test tomorrow for further assessment of 5 Plavix washout.  Timing of CABG surgery per Dr. Roxan Hockey.   Troy Sine, MD, [facility] 01/23/2020 11:12 AM

## 2020-01-23 NOTE — Progress Notes (Signed)
[id number] Gave pt OHS booklet, care guide and staying in the tube handout. Wrote down how to view pre op video. Discussed staying in the tube and sternal precautions. Gave IS and pt demonstrated 2000 ml correctly. Discussed importance of IS and mobility after surgery. Pt stated wife will be available after discharge to assist with care. Will follow up after surgery. Luetta Nutting RN BSN 01/23/2020 1:43 PM

## 2020-01-23 NOTE — Progress Notes (Signed)
ANTICOAGULATION CONSULT NOTE - Follow Up Consult  Pharmacy Consult for heparin Indication: CAD awaiting CABG  Labs: Recent Labs    01/20/20 2045 01/20/20 2245 01/22/20 0637 01/22/20 1808 01/23/20 0353  HGB 15.4  --  14.3  --  13.7  HCT 45.1  --  42.8  --  40.4  PLT 183  --  189  --  162  HEPARINUNFRC  --   --  0.21* 0.35 0.24*  CREATININE 0.86  --  0.81  --   --   TROPONINIHS 8 10  --   --   --     Assessment: 79yo male subtherapeutic on heparin after one level at lower end of goal; no gtt issues or signs of bleeding per RN.  Goal of Therapy:  Heparin level 0.3-0.7 units/ml   Plan:  Will increase heparin gtt by 2 units/kg/hr to 1500 units/hr and check level in 6 hours.    Vernard Gambles, PharmD, BCPS  01/23/2020,5:01 AM

## 2020-01-23 NOTE — Progress Notes (Signed)
ANTICOAGULATION CONSULT NOTE - Follow Up Consult  Pharmacy Consult for heparin Indication: CAD awaiting CABG  Labs: Recent Labs    01/20/20 2045 01/20/20 2045 01/20/20 2245 01/22/20 0637 01/22/20 1808 01/23/20 0353 01/23/20 1709  HGB 15.4  --   --  14.3  --  13.7  --   HCT 45.1  --   --  42.8  --  40.4  --   PLT 183  --   --  189  --  162  --   HEPARINUNFRC  --    < >  --  0.21* 0.35 0.24* 0.49  CREATININE 0.86  --   --  0.81  --   --   --   TROPONINIHS 8  --  10  --   --   --   --    < > = values in this interval not displayed.    Assessment: 79yo male with Heparin level = 0.49 , therapeutic approximately 8 hour after heparin drip increased to 1500 units/hour.  No signs of bleeding reported.  TCTS planning CABG tomorrow 01/24/20  Goal of Therapy:  Heparin level 0.3-0.7 units/ml   Plan:  Continue Heparin drip at 1500 units/hr Check heparin level and CBC daily.   TCTS planning CABG tomorrow 01/24/20   Noah Delaine, RPh Clinical Pharmacist  Please check AMION for all [facility] Pharmacy phone numbers After 10:00 PM, call Main Pharmacy [phone]  01/23/2020,5:56 PM

## 2020-01-23 NOTE — Progress Notes (Signed)
2 Days Post-Op Procedure(s) (LRB): LEFT HEART CATH AND CORONARY ANGIOGRAPHY (N/A) Subjective: No complaints at present. Had brief episodes of subscapular pain each of the last 2 nights.  Objective: Vital signs in last 24 hours: Temp:  [97.7 F (36.5 C)-98.2 F (36.8 C)] 98.2 F (36.8 C) (12/29 0331) Pulse Rate:  [76-79] 76 (12/29 0331) Cardiac Rhythm: Normal sinus rhythm (12/29 0916) Resp:  [17-19] 17 (12/29 0331) BP: (130-133)/(76-77) 130/76 (12/29 0331) SpO2:  [97 %-99 %] 97 % (12/29 0331) Weight:  [95.1 kg] 95.1 kg (12/29 0331)  Hemodynamic parameters for last 24 hours:    Intake/Output from previous day: 12/28 0701 - 12/29 0700 In: 1070.6 [P.O.:1020; I.V.:50.6] Out: 1700 [Urine:1700] Intake/Output this shift: No intake/output data recorded.  General appearance: alert, cooperative and no distress Neurologic: intact Heart: regular rate and rhythm Lungs: clear to auscultation bilaterally  Lab Results: Recent Labs    01/22/20 0637 01/23/20 0353  WBC 17.9* 14.0*  HGB 14.3 13.7  HCT 42.8 40.4  PLT 189 162   BMET:  Recent Labs    01/20/20 2045 01/22/20 0637  NA 141 139  K 3.5 3.4*  CL 101 104  CO2 27 24  GLUCOSE 161* 266*  BUN 13 11  CREATININE 0.86 0.81  CALCIUM 9.0 8.2*    PT/INR: No results for input(s): LABPROT, INR in the last 72 hours. ABG No results found for: PHART, HCO3, TCO2, ACIDBASEDEF, O2SAT CBG (last 3)  Recent Labs    01/22/20 1624 01/22/20 2132 01/23/20 0612  GLUCAP 185* 215* 180*    Assessment/Plan: S/P Procedure(s) (LRB): LEFT HEART CATH AND CORONARY ANGIOGRAPHY (N/A) - Severe 2 vessel CAD with unstable coronary syndrome  He has had episodes of pain c/w his anginal symptoms each of the past 2 evenings. Both time relieved with Tylenol and time. He has severe in stent restenosis in proximal LAD and circumflex. Concerned about trying to wait until Monday (5 more days) for full Plavix washout. His P2Y12 was 235 so bleeding risk is  relatively low. In my opinion the risk of bleeding is outweighed by the risk of cardiac event. I think we should go ahead with CABG tomorrow, albeit with a small increase in risk of bleeding/ transfusions. I discussed these issues with Sean Villarreal and he is in agreement with proceeding.  Plan CABG tomorrow  Sean Villarreal. Dorris Fetch, MD [facility] [phone]    LOS: 2 days    Loreli Slot 01/23/2020

## 2020-01-23 NOTE — Anesthesia Preprocedure Evaluation (Addendum)
Anesthesia Evaluation  Patient identified by MRN, date of birth, ID band Patient awake    Reviewed: Allergy & Precautions, NPO status , Patient's Chart, lab work & pertinent test results  Airway Mallampati: II  TM Distance: >3 FB Neck ROM: Full    Dental  (+) Edentulous Upper, Edentulous Lower   Pulmonary shortness of breath, former smoker,    Pulmonary exam normal breath sounds clear to auscultation       Cardiovascular hypertension, Pt. on home beta blockers and Pt. on medications + angina + CAD and + Past MI  Normal cardiovascular exam Rhythm:Regular Rate:Normal  Echo   1. Left ventricular ejection fraction, by estimation, is 55 to 60%. The left ventricle has normal function. The left ventricle has no regional wall motion abnormalities. There is mild concentric left ventricular hypertrophy. Left ventricular diastolic parameters are consistent with Grade I diastolic dysfunction (impaired relaxation).  2. Right ventricular systolic function is normal. The right ventricular size is normal.  3. Left atrial size was mildly dilated.  4. The mitral valve is abnormal. Trivial mitral valve regurgitation. Moderate mitral annular calcification.  5. The aortic valve is abnormal. There is moderate calcification of the aortic valve. There is moderate thickening of the aortic valve. Aortic valve regurgitation is not visualized. Mild aortic valve stenosis.    Neuro/Psych negative neurological ROS     GI/Hepatic Neg liver ROS, GERD  ,  Endo/Other  negative endocrine ROSdiabetes  Renal/GU Renal disease     Musculoskeletal  (+) Arthritis ,   Abdominal (+) + obese,   Peds  Hematology negative hematology ROS (+)   Anesthesia Other Findings   Reproductive/Obstetrics                            Anesthesia Physical Anesthesia Plan  ASA: IV  Anesthesia Plan: General   Post-op Pain Management:     Induction: Intravenous  PONV Risk Score and Plan: 3 and Treatment may vary due to age or medical condition and Midazolam  Airway Management Planned: Oral ETT  Additional Equipment: Arterial line, CVP, TEE, PA Cath and Ultrasound Guidance Line Placement  Intra-op Plan: Utilization Of Total Body Hypothermia per surgeon request  Post-operative Plan: Post-operative intubation/ventilation  Informed Consent: I have reviewed the patients History and Physical, chart, labs and discussed the procedure including the risks, benefits and alternatives for the proposed anesthesia with the patient or authorized representative who has indicated his/her understanding and acceptance.     Dental advisory given  Plan Discussed with: CRNA  Anesthesia Plan Comments:        Anesthesia Quick Evaluation

## 2020-01-24 ENCOUNTER — Inpatient Hospital Stay (HOSPITAL_COMMUNITY)
Admission: EM | Disposition: A | Discharge status: Home / Self Care | Payer: Self-pay | Source: Home / Self Care | Attending: Internal Medicine

## 2020-01-24 ENCOUNTER — Inpatient Hospital Stay (HOSPITAL_COMMUNITY): Payer: Medicare Other | Admitting: Certified Registered"

## 2020-01-24 ENCOUNTER — Inpatient Hospital Stay (HOSPITAL_COMMUNITY): Payer: Medicare Other

## 2020-01-24 DIAGNOSIS — I2510 Atherosclerotic heart disease of native coronary artery without angina pectoris: Secondary | ICD-10-CM

## 2020-01-24 HISTORY — PX: TEE WITHOUT CARDIOVERSION: SHX5443

## 2020-01-24 HISTORY — PX: CORONARY ARTERY BYPASS GRAFT: SHX141

## 2020-01-24 LAB — POCT I-STAT 7, (LYTES, BLD GAS, ICA,H+H)
Acid-Base Excess: 3 mmol/L — ABNORMAL HIGH (ref 0.0–2.0)
Acid-Base Excess: 1 mmol/L (ref 0.0–2.0)
Acid-Base Excess: 3 mmol/L — ABNORMAL HIGH (ref 0.0–2.0)
Acid-Base Excess: 0 mmol/L (ref 0.0–2.0)
Acid-base deficit: 1 mmol/L (ref 0.0–2.0)
Acid-base deficit: 1 mmol/L (ref 0.0–2.0)
Bicarbonate: 28.4 mmol/L — ABNORMAL HIGH (ref 20.0–28.0)
Bicarbonate: 26.7 mmol/L (ref 20.0–28.0)
Bicarbonate: 26.7 mmol/L (ref 20.0–28.0)
Bicarbonate: 26 mmol/L (ref 20.0–28.0)
Bicarbonate: 24.6 mmol/L (ref 20.0–28.0)
Bicarbonate: 24.3 mmol/L (ref 20.0–28.0)
Calcium, Ion: 1.19 mmol/L (ref 1.15–1.40)
Calcium, Ion: 1.01 mmol/L — ABNORMAL LOW (ref 1.15–1.40)
Calcium, Ion: 1.12 mmol/L — ABNORMAL LOW (ref 1.15–1.40)
Calcium, Ion: 1.14 mmol/L — ABNORMAL LOW (ref 1.15–1.40)
Calcium, Ion: 1.13 mmol/L — ABNORMAL LOW (ref 1.15–1.40)
Calcium, Ion: 1.15 mmol/L (ref 1.15–1.40)
HCT: 40 % (ref 39.0–52.0)
HCT: 31 % — ABNORMAL LOW (ref 39.0–52.0)
HCT: 33 % — ABNORMAL LOW (ref 39.0–52.0)
HCT: 36 % — ABNORMAL LOW (ref 39.0–52.0)
HCT: 33 % — ABNORMAL LOW (ref 39.0–52.0)
HCT: 33 % — ABNORMAL LOW (ref 39.0–52.0)
Hemoglobin: 13.6 g/dL (ref 13.0–17.0)
Hemoglobin: 10.5 g/dL — ABNORMAL LOW (ref 13.0–17.0)
Hemoglobin: 11.2 g/dL — ABNORMAL LOW (ref 13.0–17.0)
Hemoglobin: 12.2 g/dL — ABNORMAL LOW (ref 13.0–17.0)
Hemoglobin: 11.2 g/dL — ABNORMAL LOW (ref 13.0–17.0)
Hemoglobin: 11.2 g/dL — ABNORMAL LOW (ref 13.0–17.0)
O2 Saturation: 100 %
O2 Saturation: 100 %
O2 Saturation: 100 %
O2 Saturation: 96 %
O2 Saturation: 98 %
O2 Saturation: 97 %
Patient temperature: 36.3
Patient temperature: 36.8
Patient temperature: 37
Potassium: 3.6 mmol/L (ref 3.5–5.1)
Potassium: 3.5 mmol/L (ref 3.5–5.1)
Potassium: 4.2 mmol/L (ref 3.5–5.1)
Potassium: 3.6 mmol/L (ref 3.5–5.1)
Potassium: 4.3 mmol/L (ref 3.5–5.1)
Potassium: 4.2 mmol/L (ref 3.5–5.1)
Sodium: 138 mmol/L (ref 135–145)
Sodium: 139 mmol/L (ref 135–145)
Sodium: 139 mmol/L (ref 135–145)
Sodium: 141 mmol/L (ref 135–145)
Sodium: 140 mmol/L (ref 135–145)
Sodium: 140 mmol/L (ref 135–145)
TCO2: 30 mmol/L (ref 22–32)
TCO2: 28 mmol/L (ref 22–32)
TCO2: 28 mmol/L (ref 22–32)
TCO2: 27 mmol/L (ref 22–32)
TCO2: 26 mmol/L (ref 22–32)
TCO2: 26 mmol/L (ref 22–32)
pCO2 arterial: 47.8 mmHg (ref 32.0–48.0)
pCO2 arterial: 44.1 mmHg (ref 32.0–48.0)
pCO2 arterial: 38.3 mmHg (ref 32.0–48.0)
pCO2 arterial: 47.1 mmHg (ref 32.0–48.0)
pCO2 arterial: 41.3 mmHg (ref 32.0–48.0)
pCO2 arterial: 41.7 mmHg (ref 32.0–48.0)
pH, Arterial: 7.382 (ref 7.350–7.450)
pH, Arterial: 7.39 (ref 7.350–7.450)
pH, Arterial: 7.452 — ABNORMAL HIGH (ref 7.350–7.450)
pH, Arterial: 7.346 — ABNORMAL LOW (ref 7.350–7.450)
pH, Arterial: 7.383 (ref 7.350–7.450)
pH, Arterial: 7.374 (ref 7.350–7.450)
pO2, Arterial: 406 mmHg — ABNORMAL HIGH (ref 83.0–108.0)
pO2, Arterial: 279 mmHg — ABNORMAL HIGH (ref 83.0–108.0)
pO2, Arterial: 280 mmHg — ABNORMAL HIGH (ref 83.0–108.0)
pO2, Arterial: 88 mmHg (ref 83.0–108.0)
pO2, Arterial: 114 mmHg — ABNORMAL HIGH (ref 83.0–108.0)
pO2, Arterial: 94 mmHg (ref 83.0–108.0)

## 2020-01-24 LAB — POCT I-STAT, CHEM 8
BUN: 12 mg/dL (ref 8–23)
BUN: 12 mg/dL (ref 8–23)
BUN: 11 mg/dL (ref 8–23)
BUN: 10 mg/dL (ref 8–23)
Calcium, Ion: 1.22 mmol/L (ref 1.15–1.40)
Calcium, Ion: 1.24 mmol/L (ref 1.15–1.40)
Calcium, Ion: 1.07 mmol/L — ABNORMAL LOW (ref 1.15–1.40)
Calcium, Ion: 1.11 mmol/L — ABNORMAL LOW (ref 1.15–1.40)
Chloride: 100 mmol/L (ref 98–111)
Chloride: 100 mmol/L (ref 98–111)
Chloride: 100 mmol/L (ref 98–111)
Chloride: 101 mmol/L (ref 98–111)
Creatinine, Ser: 0.5 mg/dL — ABNORMAL LOW (ref 0.61–1.24)
Creatinine, Ser: 0.5 mg/dL — ABNORMAL LOW (ref 0.61–1.24)
Creatinine, Ser: 0.5 mg/dL — ABNORMAL LOW (ref 0.61–1.24)
Creatinine, Ser: 0.4 mg/dL — ABNORMAL LOW (ref 0.61–1.24)
Glucose, Bld: 237 mg/dL — ABNORMAL HIGH (ref 70–99)
Glucose, Bld: 222 mg/dL — ABNORMAL HIGH (ref 70–99)
Glucose, Bld: 176 mg/dL — ABNORMAL HIGH (ref 70–99)
Glucose, Bld: 170 mg/dL — ABNORMAL HIGH (ref 70–99)
HCT: 42 % (ref 39.0–52.0)
HCT: 39 % (ref 39.0–52.0)
HCT: 31 % — ABNORMAL LOW (ref 39.0–52.0)
HCT: 31 % — ABNORMAL LOW (ref 39.0–52.0)
Hemoglobin: 14.3 g/dL (ref 13.0–17.0)
Hemoglobin: 13.3 g/dL (ref 13.0–17.0)
Hemoglobin: 10.5 g/dL — ABNORMAL LOW (ref 13.0–17.0)
Hemoglobin: 10.5 g/dL — ABNORMAL LOW (ref 13.0–17.0)
Potassium: 3.6 mmol/L (ref 3.5–5.1)
Potassium: 3.5 mmol/L (ref 3.5–5.1)
Potassium: 4 mmol/L (ref 3.5–5.1)
Potassium: 3.6 mmol/L (ref 3.5–5.1)
Sodium: 138 mmol/L (ref 135–145)
Sodium: 138 mmol/L (ref 135–145)
Sodium: 137 mmol/L (ref 135–145)
Sodium: 139 mmol/L (ref 135–145)
TCO2: 26 mmol/L (ref 22–32)
TCO2: 25 mmol/L (ref 22–32)
TCO2: 25 mmol/L (ref 22–32)
TCO2: 25 mmol/L (ref 22–32)

## 2020-01-24 LAB — CBC
HCT: 41.3 % (ref 39.0–52.0)
HCT: 35.9 % — ABNORMAL LOW (ref 39.0–52.0)
HCT: 34.9 % — ABNORMAL LOW (ref 39.0–52.0)
Hemoglobin: 14.6 g/dL (ref 13.0–17.0)
Hemoglobin: 12 g/dL — ABNORMAL LOW (ref 13.0–17.0)
Hemoglobin: 12 g/dL — ABNORMAL LOW (ref 13.0–17.0)
MCH: 32.7 pg (ref 26.0–34.0)
MCH: 31.5 pg (ref 26.0–34.0)
MCH: 32.1 pg (ref 26.0–34.0)
MCHC: 35.4 g/dL (ref 30.0–36.0)
MCHC: 33.4 g/dL (ref 30.0–36.0)
MCHC: 34.4 g/dL (ref 30.0–36.0)
MCV: 92.4 fL (ref 80.0–100.0)
MCV: 94.2 fL (ref 80.0–100.0)
MCV: 93.3 fL (ref 80.0–100.0)
Platelets: 154 10*3/uL (ref 150–400)
Platelets: 108 10*3/uL — ABNORMAL LOW (ref 150–400)
Platelets: 129 10*3/uL — ABNORMAL LOW (ref 150–400)
RBC: 4.47 MIL/uL (ref 4.22–5.81)
RBC: 3.81 MIL/uL — ABNORMAL LOW (ref 4.22–5.81)
RBC: 3.74 MIL/uL — ABNORMAL LOW (ref 4.22–5.81)
RDW: 12.5 % (ref 11.5–15.5)
RDW: 12.3 % (ref 11.5–15.5)
RDW: 12.5 % (ref 11.5–15.5)
WBC: 9.7 10*3/uL (ref 4.0–10.5)
WBC: 15.3 10*3/uL — ABNORMAL HIGH (ref 4.0–10.5)
WBC: 14.2 10*3/uL — ABNORMAL HIGH (ref 4.0–10.5)
nRBC: 0 % (ref 0.0–0.2)
nRBC: 0 % (ref 0.0–0.2)
nRBC: 0 % (ref 0.0–0.2)

## 2020-01-24 LAB — GLUCOSE, CAPILLARY
Glucose-Capillary: 185 mg/dL — ABNORMAL HIGH (ref 70–99)
Glucose-Capillary: 134 mg/dL — ABNORMAL HIGH (ref 70–99)
Glucose-Capillary: 140 mg/dL — ABNORMAL HIGH (ref 70–99)
Glucose-Capillary: 126 mg/dL — ABNORMAL HIGH (ref 70–99)
Glucose-Capillary: 129 mg/dL — ABNORMAL HIGH (ref 70–99)
Glucose-Capillary: 100 mg/dL — ABNORMAL HIGH (ref 70–99)
Glucose-Capillary: 138 mg/dL — ABNORMAL HIGH (ref 70–99)
Glucose-Capillary: 161 mg/dL — ABNORMAL HIGH (ref 70–99)
Glucose-Capillary: 145 mg/dL — ABNORMAL HIGH (ref 70–99)
Glucose-Capillary: 133 mg/dL — ABNORMAL HIGH (ref 70–99)

## 2020-01-24 LAB — POCT I-STAT EG7
Acid-Base Excess: 2 mmol/L (ref 0.0–2.0)
Bicarbonate: 27.4 mmol/L (ref 20.0–28.0)
Calcium, Ion: 1.09 mmol/L — ABNORMAL LOW (ref 1.15–1.40)
HCT: 32 % — ABNORMAL LOW (ref 39.0–52.0)
Hemoglobin: 10.9 g/dL — ABNORMAL LOW (ref 13.0–17.0)
O2 Saturation: 75 %
Potassium: 3.4 mmol/L — ABNORMAL LOW (ref 3.5–5.1)
Sodium: 140 mmol/L (ref 135–145)
TCO2: 29 mmol/L (ref 22–32)
pCO2, Ven: 46.9 mmHg (ref 44.0–60.0)
pH, Ven: 7.375 (ref 7.250–7.430)
pO2, Ven: 42 mmHg (ref 32.0–45.0)

## 2020-01-24 LAB — BASIC METABOLIC PANEL
Anion gap: 9 (ref 5–15)
Anion gap: 10 (ref 5–15)
BUN: 11 mg/dL (ref 8–23)
BUN: 9 mg/dL (ref 8–23)
CO2: 27 mmol/L (ref 22–32)
CO2: 21 mmol/L — ABNORMAL LOW (ref 22–32)
Calcium: 8.8 mg/dL — ABNORMAL LOW (ref 8.9–10.3)
Calcium: 7.7 mg/dL — ABNORMAL LOW (ref 8.9–10.3)
Chloride: 102 mmol/L (ref 98–111)
Chloride: 104 mmol/L (ref 98–111)
Creatinine, Ser: 0.78 mg/dL (ref 0.61–1.24)
Creatinine, Ser: 0.64 mg/dL (ref 0.61–1.24)
GFR, Estimated: >60 mL/min (ref >60)
GFR, Estimated: >60 mL/min (ref >60)
Glucose, Bld: 197 mg/dL — ABNORMAL HIGH (ref 70–99)
Glucose, Bld: 144 mg/dL — ABNORMAL HIGH (ref 70–99)
Potassium: 3.6 mmol/L (ref 3.5–5.1)
Potassium: 4.1 mmol/L (ref 3.5–5.1)
Sodium: 138 mmol/L (ref 135–145)
Sodium: 135 mmol/L (ref 135–145)

## 2020-01-24 LAB — PROTIME-INR
INR: 1.4 — ABNORMAL HIGH (ref 0.8–1.2)
Prothrombin Time: 16.3 seconds — ABNORMAL HIGH (ref 11.4–15.2)

## 2020-01-24 LAB — HEPARIN LEVEL (UNFRACTIONATED): Heparin Unfractionated: 0.41 IU/mL (ref 0.30–0.70)

## 2020-01-24 LAB — ECHO INTRAOPERATIVE TEE
Height: 68 in
Weight: 3276.8 oz

## 2020-01-24 LAB — PLATELET COUNT: Platelets: 134 10*3/uL — ABNORMAL LOW (ref 150–400)

## 2020-01-24 LAB — HEMOGLOBIN AND HEMATOCRIT, BLOOD
HCT: 32.7 % — ABNORMAL LOW (ref 39.0–52.0)
Hemoglobin: 11 g/dL — ABNORMAL LOW (ref 13.0–17.0)

## 2020-01-24 LAB — APTT: aPTT: 27 seconds (ref 24–36)

## 2020-01-24 LAB — PLATELET INHIBITION P2Y12: Platelet Function  P2Y12: 151 [PRU] — ABNORMAL LOW (ref 182–335)

## 2020-01-24 LAB — MAGNESIUM: Magnesium: 2.6 mg/dL — ABNORMAL HIGH (ref 1.7–2.4)

## 2020-01-24 SURGERY — CORONARY ARTERY BYPASS GRAFTING (CABG)
Anesthesia: General | Site: Chest

## 2020-01-24 MED ORDER — MIDAZOLAM HCL 2 MG/2ML IJ SOLN
INTRAMUSCULAR | Status: AC
  Filled 2020-01-24: qty 2

## 2020-01-24 MED ORDER — OXYCODONE HCL 5 MG PO TABS
5.0000 mg | ORAL_TABLET | ORAL | Status: DC | PRN
Start: 2020-01-24 — End: 2020-01-31

## 2020-01-24 MED ORDER — ACETAMINOPHEN 500 MG PO TABS
1000.0000 mg | ORAL_TABLET | Freq: Four times a day (QID) | ORAL | Status: AC
  Administered 2020-01-25 – 2020-01-29 (×19): 1000 mg via ORAL
  Filled 2020-01-24 (×19): qty 2

## 2020-01-24 MED ORDER — DOCUSATE SODIUM 100 MG PO CAPS
200.0000 mg | ORAL_CAPSULE | Freq: Every day | ORAL | Status: DC
  Administered 2020-01-25 – 2020-01-27 (×3): 200 mg via ORAL
  Filled 2020-01-24 (×5): qty 2

## 2020-01-24 MED ORDER — HEPARIN SODIUM (PORCINE) 1000 UNIT/ML IJ SOLN
INTRAMUSCULAR | Status: DC | PRN
  Administered 2020-01-24: 28000 [IU] via INTRAVENOUS
  Administered 2020-01-24: 2000 [IU] via INTRAVENOUS

## 2020-01-24 MED ORDER — POTASSIUM CHLORIDE 10 MEQ/50ML IV SOLN
10.0000 meq | INTRAVENOUS | Status: AC
Start: 2020-01-24 — End: 2020-01-24
  Administered 2020-01-24 (×3): 10 meq via INTRAVENOUS

## 2020-01-24 MED ORDER — PROPOFOL 10 MG/ML IV BOLUS
INTRAVENOUS | Status: DC | PRN
  Administered 2020-01-24: 50 mg via INTRAVENOUS

## 2020-01-24 MED ORDER — 0.9 % SODIUM CHLORIDE (POUR BTL) OPTIME
TOPICAL | Status: DC | PRN
  Administered 2020-01-24: 07:00:00 5000 mL

## 2020-01-24 MED ORDER — LEVOFLOXACIN IN D5W 750 MG/150ML IV SOLN
750.0000 mg | Freq: Once | INTRAVENOUS | Status: AC
  Administered 2020-01-25: 750 mg via INTRAVENOUS
  Filled 2020-01-24: qty 150

## 2020-01-24 MED ORDER — INSULIN REGULAR(HUMAN) IN NACL 100-0.9 UT/100ML-% IV SOLN: INTRAVENOUS | Status: DC

## 2020-01-24 MED ORDER — METOPROLOL TARTRATE 5 MG/5ML IV SOLN: 2.5000 mg | INTRAVENOUS | Status: DC | PRN

## 2020-01-24 MED ORDER — PROPOFOL 10 MG/ML IV BOLUS
INTRAVENOUS | Status: AC
  Filled 2020-01-24: qty 20

## 2020-01-24 MED ORDER — CHLORHEXIDINE GLUCONATE 0.12% ORAL RINSE (MEDLINE KIT): 15.0000 mL | Freq: Two times a day (BID) | OROMUCOSAL | Status: DC

## 2020-01-24 MED ORDER — LACTATED RINGERS IV SOLN: INTRAVENOUS | Status: DC

## 2020-01-24 MED ORDER — FENTANYL CITRATE (PF) 250 MCG/5ML IJ SOLN
INTRAMUSCULAR | Status: AC
  Filled 2020-01-24: qty 20

## 2020-01-24 MED ORDER — MIDAZOLAM HCL (PF) 10 MG/2ML IJ SOLN
INTRAMUSCULAR | Status: AC
  Filled 2020-01-24: qty 2

## 2020-01-24 MED ORDER — ROCURONIUM BROMIDE 10 MG/ML (PF) SYRINGE
PREFILLED_SYRINGE | INTRAVENOUS | Status: DC | PRN
  Administered 2020-01-24 (×3): 50 mg via INTRAVENOUS
  Administered 2020-01-24: 100 mg via INTRAVENOUS

## 2020-01-24 MED ORDER — ALBUMIN HUMAN 5 % IV SOLN
250.0000 mL | INTRAVENOUS | Status: AC | PRN
  Administered 2020-01-24 (×3): 12.5 g via INTRAVENOUS
  Filled 2020-01-24: qty 250

## 2020-01-24 MED ORDER — PHENYLEPHRINE HCL-NACL 20-0.9 MG/250ML-% IV SOLN: 0.0000 ug/min | INTRAVENOUS | Status: DC

## 2020-01-24 MED ORDER — PANTOPRAZOLE SODIUM 40 MG PO TBEC
40.0000 mg | DELAYED_RELEASE_TABLET | Freq: Every day | ORAL | Status: DC
  Administered 2020-01-26 – 2020-01-31 (×6): 40 mg via ORAL
  Filled 2020-01-24 (×6): qty 1

## 2020-01-24 MED ORDER — FENTANYL CITRATE (PF) 250 MCG/5ML IJ SOLN
INTRAMUSCULAR | Status: DC | PRN
  Administered 2020-01-24: 50 ug via INTRAVENOUS
  Administered 2020-01-24: 200 ug via INTRAVENOUS
  Administered 2020-01-24: 150 ug via INTRAVENOUS
  Administered 2020-01-24: 300 ug via INTRAVENOUS
  Administered 2020-01-24: 100 ug via INTRAVENOUS
  Administered 2020-01-24: 50 ug via INTRAVENOUS
  Administered 2020-01-24: 150 ug via INTRAVENOUS

## 2020-01-24 MED ORDER — DEXMEDETOMIDINE HCL IN NACL 400 MCG/100ML IV SOLN: 0.0000 ug/kg/h | INTRAVENOUS | Status: DC

## 2020-01-24 MED ORDER — CHLORHEXIDINE GLUCONATE CLOTH 2 % EX PADS
6.0000 | MEDICATED_PAD | Freq: Every day | CUTANEOUS | Status: DC
  Administered 2020-01-24 – 2020-01-27 (×4): 6 via TOPICAL

## 2020-01-24 MED ORDER — TRAMADOL HCL 50 MG PO TABS
50.0000 mg | ORAL_TABLET | ORAL | Status: DC | PRN
Start: 2020-01-24 — End: 2020-01-31
  Administered 2020-01-24: 50 mg via ORAL
  Administered 2020-01-25 (×3): 100 mg via ORAL
  Administered 2020-01-25: 50 mg via ORAL
  Administered 2020-01-26 – 2020-01-30 (×3): 100 mg via ORAL
  Filled 2020-01-24 (×2): qty 2
  Filled 2020-01-24 (×2): qty 1
  Filled 2020-01-24 (×4): qty 2

## 2020-01-24 MED ORDER — PHENYLEPHRINE 40 MCG/ML (10ML) SYRINGE FOR IV PUSH (FOR BLOOD PRESSURE SUPPORT)
PREFILLED_SYRINGE | INTRAVENOUS | Status: AC
  Filled 2020-01-24: qty 10

## 2020-01-24 MED ORDER — METOPROLOL TARTRATE 25 MG/10 ML ORAL SUSPENSION: 12.5000 mg | Freq: Two times a day (BID) | ORAL | Status: DC

## 2020-01-24 MED ORDER — LACTATED RINGERS IV SOLN: INTRAVENOUS | Status: DC | PRN

## 2020-01-24 MED ORDER — MIDAZOLAM HCL 5 MG/5ML IJ SOLN
INTRAMUSCULAR | Status: DC | PRN
  Administered 2020-01-24: 2 mg via INTRAVENOUS
  Administered 2020-01-24: 4 mg via INTRAVENOUS
  Administered 2020-01-24 (×2): 2 mg via INTRAVENOUS

## 2020-01-24 MED ORDER — METOCLOPRAMIDE HCL 5 MG/ML IJ SOLN
10.0000 mg | Freq: Four times a day (QID) | INTRAMUSCULAR | Status: AC
  Administered 2020-01-24 – 2020-01-25 (×3): 10 mg via INTRAVENOUS
  Filled 2020-01-24 (×3): qty 2

## 2020-01-24 MED ORDER — ACETAMINOPHEN 160 MG/5ML PO SOLN: 650.0000 mg | Freq: Once | ORAL | Status: AC

## 2020-01-24 MED ORDER — BISACODYL 10 MG RE SUPP: 10.0000 mg | Freq: Every day | RECTAL | Status: DC

## 2020-01-24 MED ORDER — SODIUM CHLORIDE 0.9 % IV SOLN: 1.5000 g | Freq: Two times a day (BID) | INTRAVENOUS | Status: DC

## 2020-01-24 MED ORDER — PLASMA-LYTE 148 IV SOLN
INTRAVENOUS | Status: DC | PRN
  Administered 2020-01-24: 07:00:00 500 mL

## 2020-01-24 MED ORDER — DEXTROSE 50 % IV SOLN: 0.0000 mL | INTRAVENOUS | Status: DC | PRN

## 2020-01-24 MED ORDER — SODIUM CHLORIDE 0.9 % IV SOLN: INTRAVENOUS | Status: DC

## 2020-01-24 MED ORDER — ONDANSETRON HCL 4 MG/2ML IJ SOLN
4.0000 mg | Freq: Four times a day (QID) | INTRAMUSCULAR | Status: DC | PRN
  Administered 2020-01-26: 4 mg via INTRAVENOUS
  Filled 2020-01-24: qty 2

## 2020-01-24 MED ORDER — SODIUM CHLORIDE 0.9% FLUSH: 3.0000 mL | INTRAVENOUS | Status: DC | PRN

## 2020-01-24 MED ORDER — MAGNESIUM SULFATE 4 GM/100ML IV SOLN
4.0000 g | Freq: Once | INTRAVENOUS | Status: AC
  Administered 2020-01-24: 13:00:00 4 g via INTRAVENOUS
  Filled 2020-01-24: qty 100

## 2020-01-24 MED ORDER — MORPHINE SULFATE (PF) 2 MG/ML IV SOLN
1.0000 mg | INTRAVENOUS | Status: DC | PRN
  Administered 2020-01-24 – 2020-01-25 (×4): 2 mg via INTRAVENOUS
  Filled 2020-01-24 (×4): qty 1

## 2020-01-24 MED ORDER — NITROGLYCERIN IN D5W 200-5 MCG/ML-% IV SOLN
0.0000 ug/min | INTRAVENOUS | Status: DC
  Administered 2020-01-24: 23:00:00 5 ug/min via INTRAVENOUS

## 2020-01-24 MED ORDER — SODIUM CHLORIDE 0.9 % IV SOLN: 250.0000 mL | INTRAVENOUS | Status: DC

## 2020-01-24 MED ORDER — BISACODYL 5 MG PO TBEC
10.0000 mg | DELAYED_RELEASE_TABLET | Freq: Every day | ORAL | Status: DC
  Administered 2020-01-25 – 2020-01-27 (×3): 10 mg via ORAL
  Filled 2020-01-24 (×5): qty 2

## 2020-01-24 MED ORDER — METOPROLOL TARTRATE 12.5 MG HALF TABLET
12.5000 mg | ORAL_TABLET | Freq: Two times a day (BID) | ORAL | Status: DC
  Administered 2020-01-24: 23:00:00 12.5 mg via ORAL
  Filled 2020-01-24: qty 1

## 2020-01-24 MED ORDER — ACETAMINOPHEN 160 MG/5ML PO SOLN: 1000.0000 mg | Freq: Four times a day (QID) | ORAL | Status: AC

## 2020-01-24 MED ORDER — SODIUM CHLORIDE 0.9% FLUSH
3.0000 mL | Freq: Two times a day (BID) | INTRAVENOUS | Status: DC
  Administered 2020-01-24 – 2020-01-26 (×5): 3 mL via INTRAVENOUS

## 2020-01-24 MED ORDER — ASPIRIN EC 325 MG PO TBEC
325.0000 mg | DELAYED_RELEASE_TABLET | Freq: Every day | ORAL | Status: DC
  Administered 2020-01-25 – 2020-01-28 (×4): 325 mg via ORAL
  Filled 2020-01-24 (×4): qty 1

## 2020-01-24 MED ORDER — FENTANYL CITRATE (PF) 250 MCG/5ML IJ SOLN
INTRAMUSCULAR | Status: AC
  Filled 2020-01-24: qty 5

## 2020-01-24 MED ORDER — SODIUM CHLORIDE (PF) 0.9 % IJ SOLN
OROMUCOSAL | Status: DC | PRN
  Administered 2020-01-24: 07:00:00 12 mL via TOPICAL

## 2020-01-24 MED ORDER — PROTAMINE SULFATE 10 MG/ML IV SOLN
INTRAVENOUS | Status: DC | PRN
  Administered 2020-01-24: 70 mg via INTRAVENOUS
  Administered 2020-01-24: 20 mg via INTRAVENOUS
  Administered 2020-01-24 (×3): 50 mg via INTRAVENOUS
  Administered 2020-01-24 (×2): 30 mg via INTRAVENOUS

## 2020-01-24 MED ORDER — FAMOTIDINE 20 MG IN NS 100 ML IVPB
20.0000 mg | Freq: Two times a day (BID) | INTRAVENOUS | Status: DC
  Administered 2020-01-24: 23:00:00 20 mg via INTRAVENOUS
  Filled 2020-01-24 (×2): qty 100

## 2020-01-24 MED ORDER — MIDAZOLAM HCL 2 MG/2ML IJ SOLN: 2.0000 mg | INTRAMUSCULAR | Status: DC | PRN

## 2020-01-24 MED ORDER — ORAL CARE MOUTH RINSE
15.0000 mL | Freq: Four times a day (QID) | OROMUCOSAL | Status: DC
  Administered 2020-01-24: 23:00:00 15 mL via OROMUCOSAL

## 2020-01-24 MED ORDER — CHLORHEXIDINE GLUCONATE 0.12 % MT SOLN
15.0000 mL | OROMUCOSAL | Status: AC
  Administered 2020-01-24: 13:00:00 15 mL via OROMUCOSAL

## 2020-01-24 MED ORDER — LACTATED RINGERS IV SOLN: 500.0000 mL | Freq: Once | INTRAVENOUS | Status: DC | PRN

## 2020-01-24 MED ORDER — LIDOCAINE 2% (20 MG/ML) 5 ML SYRINGE
INTRAMUSCULAR | Status: DC | PRN
  Administered 2020-01-24: 60 mg via INTRAVENOUS
  Administered 2020-01-24: 50 mg via INTRAVENOUS

## 2020-01-24 MED ORDER — SODIUM CHLORIDE 0.45 % IV SOLN: INTRAVENOUS | Status: DC | PRN

## 2020-01-24 MED ORDER — ORAL CARE MOUTH RINSE
15.0000 mL | OROMUCOSAL | Status: DC
  Administered 2020-01-24 (×3): 15 mL via OROMUCOSAL

## 2020-01-24 MED ORDER — PHENYLEPHRINE HCL (PRESSORS) 10 MG/ML IV SOLN
INTRAVENOUS | Status: DC | PRN
  Administered 2020-01-24 (×2): 40 ug via INTRAVENOUS

## 2020-01-24 MED ORDER — ACETAMINOPHEN 650 MG RE SUPP
650.0000 mg | Freq: Once | RECTAL | Status: AC
  Administered 2020-01-24: 14:00:00 650 mg via RECTAL

## 2020-01-24 MED ORDER — ASPIRIN 81 MG PO CHEW: 324.0000 mg | CHEWABLE_TABLET | Freq: Every day | ORAL | Status: DC

## 2020-01-24 MED ORDER — HEMOSTATIC AGENTS (NO CHARGE) OPTIME
TOPICAL | Status: DC | PRN
  Administered 2020-01-24: 1 via TOPICAL

## 2020-01-24 MED ORDER — VANCOMYCIN HCL IN DEXTROSE 1-5 GM/200ML-% IV SOLN
1000.0000 mg | Freq: Once | INTRAVENOUS | Status: AC
  Administered 2020-01-24: 19:00:00 1000 mg via INTRAVENOUS
  Filled 2020-01-24: qty 200

## 2020-01-24 MED ORDER — ALBUMIN HUMAN 5 % IV SOLN: INTRAVENOUS | Status: DC | PRN

## 2020-01-24 MED ORDER — SODIUM CHLORIDE 0.9% FLUSH
3.0000 mL | Freq: Two times a day (BID) | INTRAVENOUS | Status: DC
  Administered 2020-01-25 – 2020-01-27 (×5): 3 mL via INTRAVENOUS

## 2020-01-24 SURGICAL SUPPLY — 86 items
BAG DECANTER FOR FLEXI CONT (MISCELLANEOUS) ×3 IMPLANT
BLADE CLIPPER SURG (BLADE) ×3 IMPLANT
BLADE STERNUM SYSTEM 6 (BLADE) ×3 IMPLANT
BLADE SURG 11 STRL SS (BLADE) ×1 IMPLANT
BNDG ELASTIC 4X5.8 VLCR STR LF (GAUZE/BANDAGES/DRESSINGS) ×3 IMPLANT
BNDG ELASTIC 6X5.8 VLCR STR LF (GAUZE/BANDAGES/DRESSINGS) ×3 IMPLANT
BNDG GAUZE ELAST 4 BULKY (GAUZE/BANDAGES/DRESSINGS) ×3 IMPLANT
CANISTER SUCT 3000ML PPV (MISCELLANEOUS) ×3 IMPLANT
CANNULA EZ GLIDE AORTIC 21FR (CANNULA) ×3 IMPLANT
CATH CPB KIT HENDRICKSON (MISCELLANEOUS) ×3 IMPLANT
CATH ROBINSON RED A/P 18FR (CATHETERS) ×3 IMPLANT
CATH THORACIC 36FR (CATHETERS) ×3 IMPLANT
CATH THORACIC 36FR RT ANG (CATHETERS) ×3 IMPLANT
CLIP VESOCCLUDE MED 24/CT (CLIP) IMPLANT
CLIP VESOCCLUDE SM WIDE 24/CT (CLIP) ×2 IMPLANT
DEFOGGER ANTIFOG KIT (MISCELLANEOUS) ×1 IMPLANT
DRAPE CARDIOVASCULAR INCISE (DRAPES) ×1
DRAPE SLUSH/WARMER DISC (DRAPES) ×3 IMPLANT
DRAPE SRG 135X102X78XABS (DRAPES) ×2 IMPLANT
DRSG COVADERM 4X14 (GAUZE/BANDAGES/DRESSINGS) ×3 IMPLANT
ELECT REM PT RETURN 9FT ADLT (ELECTROSURGICAL) ×6
ELECTRODE REM PT RTRN 9FT ADLT (ELECTROSURGICAL) ×4 IMPLANT
FELT TEFLON 1X6 (MISCELLANEOUS) ×5 IMPLANT
GAUZE SPONGE 4X4 12PLY STRL (GAUZE/BANDAGES/DRESSINGS) ×6 IMPLANT
GAUZE SPONGE 4X4 12PLY STRL LF (GAUZE/BANDAGES/DRESSINGS) ×2 IMPLANT
GLOVE BIO SURGEON STRL SZ 6.5 (GLOVE) ×2 IMPLANT
GLOVE SURG SIGNA 7.5 PF LTX (GLOVE) ×9 IMPLANT
GOWN STRL REUS W/ TWL LRG LVL3 (GOWN DISPOSABLE) ×8 IMPLANT
GOWN STRL REUS W/ TWL XL LVL3 (GOWN DISPOSABLE) ×4 IMPLANT
GOWN STRL REUS W/TWL LRG LVL3 (GOWN DISPOSABLE) ×8
GOWN STRL REUS W/TWL XL LVL3 (GOWN DISPOSABLE) ×2
HEMOSTAT POWDER SURGIFOAM 1G (HEMOSTASIS) ×9 IMPLANT
HEMOSTAT SURGICEL 2X14 (HEMOSTASIS) ×3 IMPLANT
INSERT FOGARTY XLG (MISCELLANEOUS) IMPLANT
KIT BASIN OR (CUSTOM PROCEDURE TRAY) ×3 IMPLANT
KIT SUCTION CATH 14FR (SUCTIONS) ×6 IMPLANT
KIT TURNOVER KIT B (KITS) ×3 IMPLANT
KIT VASOVIEW ACCESSORY VH 2004 (KITS) ×1 IMPLANT
KIT VASOVIEW HEMOPRO 2 VH 4000 (KITS) ×3 IMPLANT
MARKER GRAFT CORONARY BYPASS (MISCELLANEOUS) ×9 IMPLANT
NS IRRIG 1000ML POUR BTL (IV SOLUTION) ×15 IMPLANT
PACK E OPEN HEART (SUTURE) ×3 IMPLANT
PACK OPEN HEART (CUSTOM PROCEDURE TRAY) ×3 IMPLANT
PAD ARMBOARD 7.5X6 YLW CONV (MISCELLANEOUS) ×6 IMPLANT
PAD ELECT DEFIB RADIOL ZOLL (MISCELLANEOUS) ×3 IMPLANT
PENCIL BUTTON HOLSTER BLD 10FT (ELECTRODE) ×3 IMPLANT
POSITIONER HEAD DONUT 9IN (MISCELLANEOUS) ×3 IMPLANT
PUNCH AORTIC ROTATE 4.0MM (MISCELLANEOUS) ×1 IMPLANT
PUNCH AORTIC ROTATE 4.5MM 8IN (MISCELLANEOUS) IMPLANT
PUNCH AORTIC ROTATE 5MM 8IN (MISCELLANEOUS) IMPLANT
SET CARDIOPLEGIA MPS 5001102 (MISCELLANEOUS) ×1 IMPLANT
SUPPORT HEART JANKE-BARRON (MISCELLANEOUS) ×3 IMPLANT
SUT BONE WAX W31G (SUTURE) ×3 IMPLANT
SUT ETHIBOND 2 0 SH (SUTURE) ×1
SUT ETHIBOND 2 0 SH 36X2 (SUTURE) IMPLANT
SUT MNCRL AB 4-0 PS2 18 (SUTURE) ×1 IMPLANT
SUT PROLENE 3 0 SH DA (SUTURE) ×4 IMPLANT
SUT PROLENE 4 0 RB 1 (SUTURE) ×2
SUT PROLENE 4 0 SH DA (SUTURE) IMPLANT
SUT PROLENE 4-0 RB1 .5 CRCL 36 (SUTURE) IMPLANT
SUT PROLENE 6 0 C 1 30 (SUTURE) ×6 IMPLANT
SUT PROLENE 7 0 BV1 MDA (SUTURE) ×3 IMPLANT
SUT PROLENE 8 0 BV175 6 (SUTURE) IMPLANT
SUT SILK  1 MH (SUTURE) ×1
SUT SILK 1 MH (SUTURE) IMPLANT
SUT STEEL 6MS V (SUTURE) ×3 IMPLANT
SUT STEEL STERNAL CCS#1 18IN (SUTURE) IMPLANT
SUT STEEL SZ 6 DBL 3X14 BALL (SUTURE) ×3 IMPLANT
SUT VIC AB 1 CTX 36 (SUTURE) ×2
SUT VIC AB 1 CTX36XBRD ANBCTR (SUTURE) ×4 IMPLANT
SUT VIC AB 2-0 CT1 27 (SUTURE) ×1
SUT VIC AB 2-0 CT1 TAPERPNT 27 (SUTURE) IMPLANT
SUT VIC AB 2-0 CTX 27 (SUTURE) IMPLANT
SUT VIC AB 3-0 SH 27 (SUTURE)
SUT VIC AB 3-0 SH 27X BRD (SUTURE) IMPLANT
SUT VIC AB 3-0 X1 27 (SUTURE) IMPLANT
SUT VICRYL 4-0 PS2 18IN ABS (SUTURE) IMPLANT
SYSTEM SAHARA CHEST DRAIN ATS (WOUND CARE) ×3 IMPLANT
TAPE CLOTH SURG 4X10 WHT LF (GAUZE/BANDAGES/DRESSINGS) ×1 IMPLANT
TAPE PAPER 2X10 WHT MICROPORE (GAUZE/BANDAGES/DRESSINGS) ×1 IMPLANT
TOWEL GREEN STERILE (TOWEL DISPOSABLE) ×3 IMPLANT
TOWEL GREEN STERILE FF (TOWEL DISPOSABLE) ×3 IMPLANT
TRAY FOLEY SLVR 16FR TEMP STAT (SET/KITS/TRAYS/PACK) ×3 IMPLANT
TUBING LAP HI FLOW INSUFFLATIO (TUBING) ×4 IMPLANT
UNDERPAD 30X36 HEAVY ABSORB (UNDERPADS AND DIAPERS) ×3 IMPLANT
WATER STERILE IRR 1000ML POUR (IV SOLUTION) ×6 IMPLANT

## 2020-01-24 NOTE — Hospital Course (Addendum)
History of Present Illness:  [name] is a 79 year old male with a past medical history of coronary artery disease (s/p DES to LAD and LCx in 04/2012, PTCA to ostial LCx in 03/2016 due to ISR, DES to ostial LCx in 09/2016 due to ISR, patent stents by repeat cath in 03/2018), hypertension, hyperlipidemia, Type 2 diabetes mellitus, and mild AS who presented to [facility] ED on 01/20/2020 with complaints of chest pressure/pain radiating to  shoulder blades and dizziness. Patient denied nausea, emesis, LE edema, or diaphoresis. Patient states that this has happened mostly with exertion, but has occurred recently while at rest. He also has complaints of shortness of breath at times as well with the aforementioned symptoms. EKG showed normal sinus rhythm, LVH. Initial Troponin high sensitivity was 8. It was previously recommend by Dr. Diona Browner to pursue a repeat cardiac catheterization if the patient had progressive symptoms. Dr. Tenny Craw reviewed with the patient on 01/21/2020 and he is in agreement to proceed. Patient was transferred to [facility] and underwent a cardiac catheterization earlier today with Dr. Katrinka Blazing. Results showed severe, calcific coronary artery disease involving the LAD, Circumflex, and left main (40-50% LM stenosis). Dr. Dorris Fetch has been consulted for the consideration of coronary artery bypass grafting surgery.  Hospital Course:  The patient was evaluated by Dr. Dorris Fetch who felt coronary bypass grafting would be the best treatment option.  Unfortunately the patient had previously been on Plavix and would require a washout prior to proceeding with surgery.  He developed complaints of scapular pain overnight on 2 occasions while waiting for surgery.  A P2Y12 level was checked and was 235.  Due to this his symptoms it was felt the patient should not wait the full 5 days prior to surgery.  His P2Y12 scored indicated a low risk of bleeding.  The risks and benefits of the procedure were  explained to the patient and he was agreeable to proceed.  He was taken to the operating room on 01/24/2020.  He underwent CABG x 2 utilizing LIMA to LAD and SVG to OM 1.  He also underwent endoscopic harvest of greater saphenous vein from his right thigh.  He tolerated the procedure without difficulty and was taken to the SICU in stable condition.  He was extubated early the afternoon of surgery. He was initially AAI paced. He was weaned off the Nitro drip. Theone Murdoch, a line, chest tubes, and foley were removed early in his hospital course. He we restarted on Coreg. He was volume overloaded and diuresed accordingly.  He had expected acute blood loss anemia. He did not require a transfusion post op. His last H and H was 10.9 and 32.7. He had thrombocytopenia post op. His platelet count went as low as 100,000 and was 101,000 on 01/02. He was weaned off the Insulin drip. His pre op HGA1C was 7.6. He will be restarted on Metformin and Glimepiride closer to discharge. He was felt surgically stable for transfer from the ICU to 4E for further convalescence on 01/26/2020. Epicardial pacing wires were removed on 01/03. He was tolerating a diet and given a laxative to help with bowel movement. He was ambulating on room air with good oxygenation. He was maintaining sinus rhythm and restarted on low dose Lisinopril for better BP control. Sternal and RLE wounds are clean, dry, and healing without signs of infection. He is felt surgically stable for discharge today.

## 2020-01-24 NOTE — Transfer of Care (Signed)
Immediate Anesthesia Transfer of Care Note  Patient: Sean Villarreal  Procedure(s) Performed: CORONARY ARTERY BYPASS GRAFTING (CABG), ON PUMP, TIMES TWO, USING LEFT INTERNAL MAMMARY ARTERY AND ENDOSCOPICALLY HARVESTED RIGHT GREATER SAPHENOUS VEIN (N/A Chest) TRANSESOPHAGEAL ECHOCARDIOGRAM (TEE) (N/A )  Patient Location: SICU  Anesthesia Type:General  Level of Consciousness: Patient remains intubated per anesthesia plan  Airway & Oxygen Therapy: Patient remains intubated per anesthesia plan and Patient placed on Ventilator (see vital sign flow sheet for setting)  Post-op Assessment: Post -op Vital signs reviewed and stable  Post vital signs: stable  Last Vitals:  Vitals Value Taken Time  BP    Temp    Pulse    Resp    SpO2      Last Pain:  Vitals:   01/24/20 0535  TempSrc:   PainSc: 0-No pain      Patients Stated Pain Goal: 2 (01/22/20 2159)  Complications: No complications documented.

## 2020-01-24 NOTE — Progress Notes (Signed)
  Echocardiogram Echocardiogram Transesophageal has been performed.  Gerda Diss 01/24/2020, 8:26 AM

## 2020-01-24 NOTE — Anesthesia Procedure Notes (Signed)
Arterial Line Insertion Start/End12/30/2021 6:45 AM, 01/24/2020 6:50 AM Performed by: Dorie Rank, CRNA, CRNA  Preanesthetic checklist: patient identified, IV checked, risks and benefits discussed, surgical consent, monitors and equipment checked, pre-op evaluation and timeout performed Lidocaine 1% used for infiltration and patient sedated Left, radial was placed Catheter size: 20 G Hand hygiene performed , maximum sterile barriers used  and Seldinger technique used Allen's test indicative of satisfactory collateral circulation Attempts: 1 Procedure performed without using ultrasound guided technique. Following insertion, Biopatch and dressing applied. Post procedure assessment: normal  Patient tolerated the procedure well with no immediate complications.

## 2020-01-24 NOTE — Procedures (Signed)
Extubation Procedure Note  Patient Details:   Name: Sean Villarreal DOB: 06/02/1940 MRN: [id number]   Airway Documentation:    Vent end date: 01/24/20 Vent end time: 1549   Evaluation  O2 sats: stable throughout Complications: No apparent complications Patient did tolerate procedure well. Bilateral Breath Sounds: Diminished,Clear   Yes   Positive cuff leak noted.  NIF -24, VC 770 mL.  Patient placed on 4L [place] with humidity, no stridor noted.  Patient able to reach 625 mL using the incentive spirometer.  [name] 01/24/2020, 4:04 PM

## 2020-01-24 NOTE — Anesthesia Postprocedure Evaluation (Addendum)
Anesthesia Post Note  Patient: Sean Villarreal  Procedure(s) Performed: CORONARY ARTERY BYPASS GRAFTING (CABG), ON PUMP, TIMES TWO, USING LEFT INTERNAL MAMMARY ARTERY AND ENDOSCOPICALLY HARVESTED RIGHT GREATER SAPHENOUS VEIN (N/A Chest) TRANSESOPHAGEAL ECHOCARDIOGRAM (TEE) (N/A )     Patient location during evaluation: SICU Anesthesia Type: General Level of consciousness: sedated and patient remains intubated per anesthesia plan Pain management: pain level controlled Vital Signs Assessment: post-procedure vital signs reviewed and stable Respiratory status: patient remains intubated per anesthesia plan and patient on ventilator - see flowsheet for VS Cardiovascular status: stable Anesthetic complications: no   No complications documented.  Last Vitals:  Vitals:   01/24/20 1830 01/24/20 1845  BP:    Pulse: 80 80  Resp: (!) 21 19  Temp: 37.3 C 37.4 C  SpO2: 94% 98%    Last Pain:  Vitals:   01/24/20 1845  TempSrc:   PainSc: 3                  Lewie Loron

## 2020-01-24 NOTE — Anesthesia Procedure Notes (Addendum)
Central Venous Catheter Insertion Performed by: Heather Roberts, MD, anesthesiologist Start/End12/30/2021 6:48 AM, 01/24/2020 6:58 AM Patient location: Pre-op. Preanesthetic checklist: patient identified, IV checked, site marked, risks and benefits discussed, surgical consent, monitors and equipment checked, pre-op evaluation, timeout performed and anesthesia consent Position: Trendelenburg Lidocaine 1% used for infiltration and patient sedated Hand hygiene performed , maximum sterile barriers used  and Seldinger technique used Catheter size: 8.5 Fr Total catheter length 8. PA cath was placed.Sheath introducer Swan type:thermodilution PA Cath depth:50 Procedure performed using ultrasound guided technique. Ultrasound Notes:anatomy identified, needle tip was noted to be adjacent to the nerve/plexus identified, no ultrasound evidence of intravascular and/or intraneural injection and image(s) printed for medical record Attempts: 1 Following insertion, line sutured, dressing applied and Biopatch. Post procedure assessment: free fluid flow, blood return through all ports and no air  Patient tolerated the procedure well with no immediate complications.

## 2020-01-24 NOTE — Progress Notes (Signed)
[facility] BRIEF SICU PROGRESS NOTE  Day of Surgery  S/P Procedure(s) (LRB): CORONARY ARTERY BYPASS GRAFTING (CABG), ON PUMP, TIMES TWO, USING LEFT INTERNAL MAMMARY ARTERY AND ENDOSCOPICALLY HARVESTED RIGHT GREATER SAPHENOUS VEIN (N/A) TRANSESOPHAGEAL ECHOCARDIOGRAM (TEE) (N/A)   Extubated uneventfully NSR w/ stable hemodynamics Breathing comfortably w/ O2 sats 97% on nasal cannula Chest tube output low UOP adequate Labs okay  Plan: Continue routine early postop  Purcell Nails, MD 01/24/2020 5:48 PM

## 2020-01-24 NOTE — Brief Op Note (Signed)
01/20/2020 - 01/24/2020  12:05 PM  PATIENT:  Sean Villarreal  79 y.o. male  PRE-OPERATIVE DIAGNOSIS:  2 vessel Coronary Artery Disease  POST-OPERATIVE DIAGNOSIS:  2 vessel Coronary Artery Disease  PROCEDURE:  Procedure(s):  CORONARY ARTERY BYPASS GRAFTING x 2 -LIMA to LAD -SVG to OM  ENDOSCOPIC HARVEST GREATER SAPHENOUS VEIN -Right Thigh ( 35/10)  TRANSESOPHAGEAL ECHOCARDIOGRAM (TEE) (N/A)  SURGEON:  Surgeon(s) and Role:    Loreli Slot, MD - Primary  PHYSICIAN ASSISTANT: Lowella Dandy PA-C  ANESTHESIA:   general  EBL:  700 mL   BLOOD ADMINISTERED: CELLSAVER  DRAINS: Left Pleural Chest Tube, Mediastinal Chest Drains   LOCAL MEDICATIONS USED:  NONE  SPECIMEN:  No Specimen  DISPOSITION OF SPECIMEN:  N/A  COUNTS:  YES  TOURNIQUET:  * No tourniquets in log *  DICTATION: .Dragon Dictation  PLAN OF CARE: Admit to inpatient   PATIENT DISPOSITION:  ICU - intubated and hemodynamically stable.   Delay start of Pharmacological VTE agent (>24hrs) due to surgical blood loss or risk of bleeding: yes

## 2020-01-24 NOTE — Interval H&P Note (Signed)
History and Physical Interval Note:  01/24/2020 7:43 AM  [name]  has presented today for surgery, with the diagnosis of 2 vessel Coronary Artery Disease.  The various methods of treatment have been discussed with the patient and family. After consideration of risks, benefits and other options for treatment, the patient has consented to  Procedure(s): CORONARY ARTERY BYPASS GRAFTING (CABG),TEE (N/A) TRANSESOPHAGEAL ECHOCARDIOGRAM (TEE) (N/A) as a surgical intervention.  The patient's history has been reviewed, patient examined, no change in status, stable for surgery.  I have reviewed the patient's chart and labs.  Questions were answered to the patient's satisfaction.     Sean Villarreal

## 2020-01-24 NOTE — Anesthesia Procedure Notes (Signed)
Procedure Name: Intubation Date/Time: 01/24/2020 8:36 AM Performed by: Lavell Luster, CRNA Pre-anesthesia Checklist: Patient identified, Emergency Drugs available, Suction available, Patient being monitored and Timeout performed Patient Re-evaluated:Patient Re-evaluated prior to induction Oxygen Delivery Method: Circle system utilized Preoxygenation: Pre-oxygenation with 100% oxygen Induction Type: IV induction Ventilation: Mask ventilation without difficulty and Oral airway inserted - appropriate to patient size Laryngoscope Size: Mac and 4 Grade View: Grade I Tube type: Oral Tube size: 7.5 mm Number of attempts: 1 Airway Equipment and Method: Stylet Placement Confirmation: ETT inserted through vocal cords under direct vision,  positive ETCO2 and breath sounds checked- equal and bilateral Secured at: 22 cm Tube secured with: Tape Dental Injury: Teeth and Oropharynx as per pre-operative assessment

## 2020-01-25 ENCOUNTER — Inpatient Hospital Stay (HOSPITAL_COMMUNITY): Payer: Medicare Other

## 2020-01-25 ENCOUNTER — Encounter (HOSPITAL_COMMUNITY): Payer: Self-pay | Admitting: Thoracic Surgery (Cardiothoracic Vascular Surgery)

## 2020-01-25 DIAGNOSIS — I25119 Atherosclerotic heart disease of native coronary artery with unspecified angina pectoris: Secondary | ICD-10-CM | POA: Diagnosis not present

## 2020-01-25 DIAGNOSIS — E785 Hyperlipidemia, unspecified: Secondary | ICD-10-CM | POA: Diagnosis not present

## 2020-01-25 DIAGNOSIS — I350 Nonrheumatic aortic (valve) stenosis: Secondary | ICD-10-CM | POA: Diagnosis not present

## 2020-01-25 DIAGNOSIS — E119 Type 2 diabetes mellitus without complications: Secondary | ICD-10-CM

## 2020-01-25 LAB — CBC
HCT: 33.9 % — ABNORMAL LOW (ref 39.0–52.0)
HCT: 34.3 % — ABNORMAL LOW (ref 39.0–52.0)
Hemoglobin: 11.3 g/dL — ABNORMAL LOW (ref 13.0–17.0)
Hemoglobin: 12.2 g/dL — ABNORMAL LOW (ref 13.0–17.0)
MCH: 31.1 pg (ref 26.0–34.0)
MCH: 32.9 pg (ref 26.0–34.0)
MCHC: 33.3 g/dL (ref 30.0–36.0)
MCHC: 35.6 g/dL (ref 30.0–36.0)
MCV: 93.4 fL (ref 80.0–100.0)
MCV: 92.5 fL (ref 80.0–100.0)
Platelets: 118 10*3/uL — ABNORMAL LOW (ref 150–400)
Platelets: 124 10*3/uL — ABNORMAL LOW (ref 150–400)
RBC: 3.63 MIL/uL — ABNORMAL LOW (ref 4.22–5.81)
RBC: 3.71 MIL/uL — ABNORMAL LOW (ref 4.22–5.81)
RDW: 12.3 % (ref 11.5–15.5)
RDW: 12.5 % (ref 11.5–15.5)
WBC: 12.6 10*3/uL — ABNORMAL HIGH (ref 4.0–10.5)
WBC: 15.3 10*3/uL — ABNORMAL HIGH (ref 4.0–10.5)
nRBC: 0 % (ref 0.0–0.2)
nRBC: 0 % (ref 0.0–0.2)

## 2020-01-25 LAB — BASIC METABOLIC PANEL
Anion gap: 10 (ref 5–15)
Anion gap: 10 (ref 5–15)
BUN: 7 mg/dL — ABNORMAL LOW (ref 8–23)
BUN: 9 mg/dL (ref 8–23)
CO2: 23 mmol/L (ref 22–32)
CO2: 23 mmol/L (ref 22–32)
Calcium: 7.7 mg/dL — ABNORMAL LOW (ref 8.9–10.3)
Calcium: 7.9 mg/dL — ABNORMAL LOW (ref 8.9–10.3)
Chloride: 103 mmol/L (ref 98–111)
Chloride: 100 mmol/L (ref 98–111)
Creatinine, Ser: 0.64 mg/dL (ref 0.61–1.24)
Creatinine, Ser: 0.68 mg/dL (ref 0.61–1.24)
GFR, Estimated: >60 mL/min (ref >60)
GFR, Estimated: >60 mL/min (ref >60)
Glucose, Bld: 116 mg/dL — ABNORMAL HIGH (ref 70–99)
Glucose, Bld: 202 mg/dL — ABNORMAL HIGH (ref 70–99)
Potassium: 3.5 mmol/L (ref 3.5–5.1)
Potassium: 3.9 mmol/L (ref 3.5–5.1)
Sodium: 136 mmol/L (ref 135–145)
Sodium: 133 mmol/L — ABNORMAL LOW (ref 135–145)

## 2020-01-25 LAB — GLUCOSE, CAPILLARY
Glucose-Capillary: 108 mg/dL — ABNORMAL HIGH (ref 70–99)
Glucose-Capillary: 126 mg/dL — ABNORMAL HIGH (ref 70–99)
Glucose-Capillary: 128 mg/dL — ABNORMAL HIGH (ref 70–99)
Glucose-Capillary: 132 mg/dL — ABNORMAL HIGH (ref 70–99)
Glucose-Capillary: 134 mg/dL — ABNORMAL HIGH (ref 70–99)
Glucose-Capillary: 144 mg/dL — ABNORMAL HIGH (ref 70–99)
Glucose-Capillary: 145 mg/dL — ABNORMAL HIGH (ref 70–99)
Glucose-Capillary: 164 mg/dL — ABNORMAL HIGH (ref 70–99)
Glucose-Capillary: 184 mg/dL — ABNORMAL HIGH (ref 70–99)
Glucose-Capillary: 203 mg/dL — ABNORMAL HIGH (ref 70–99)
Glucose-Capillary: 207 mg/dL — ABNORMAL HIGH (ref 70–99)
Glucose-Capillary: 243 mg/dL — ABNORMAL HIGH (ref 70–99)
Glucose-Capillary: 246 mg/dL — ABNORMAL HIGH (ref 70–99)

## 2020-01-25 LAB — MAGNESIUM
Magnesium: 1.9 mg/dL (ref 1.7–2.4)
Magnesium: 1.8 mg/dL (ref 1.7–2.4)

## 2020-01-25 SURGERY — Surgical Case
Anesthesia: *Unknown

## 2020-01-25 MED ORDER — POTASSIUM CHLORIDE 10 MEQ/50ML IV SOLN
10.0000 meq | INTRAVENOUS | Status: AC
  Administered 2020-01-25 (×3): 10 meq via INTRAVENOUS
  Filled 2020-01-25 (×3): qty 50

## 2020-01-25 MED ORDER — INSULIN ASPART 100 UNIT/ML ~~LOC~~ SOLN
0.0000 [IU] | SUBCUTANEOUS | Status: DC
  Administered 2020-01-25: 4 [IU] via SUBCUTANEOUS

## 2020-01-25 MED ORDER — POTASSIUM CHLORIDE 10 MEQ/50ML IV SOLN
10.0000 meq | INTRAVENOUS | Status: AC
  Administered 2020-01-25 (×2): 10 meq via INTRAVENOUS
  Filled 2020-01-25 (×2): qty 50

## 2020-01-25 MED ORDER — INSULIN ASPART 100 UNIT/ML ~~LOC~~ SOLN: 0.0000 [IU] | SUBCUTANEOUS | Status: DC

## 2020-01-25 MED ORDER — ORAL CARE MOUTH RINSE
15.0000 mL | Freq: Two times a day (BID) | OROMUCOSAL | Status: DC
  Administered 2020-01-25 – 2020-01-30 (×12): 15 mL via OROMUCOSAL

## 2020-01-25 MED ORDER — FUROSEMIDE 10 MG/ML IJ SOLN
20.0000 mg | Freq: Once | INTRAMUSCULAR | Status: AC
  Administered 2020-01-25: 20 mg via INTRAVENOUS
  Filled 2020-01-25: qty 2

## 2020-01-25 MED ORDER — ENOXAPARIN SODIUM 40 MG/0.4ML ~~LOC~~ SOLN
40.0000 mg | Freq: Every day | SUBCUTANEOUS | Status: DC
  Administered 2020-01-25 – 2020-01-29 (×5): 40 mg via SUBCUTANEOUS
  Filled 2020-01-25 (×5): qty 0.4

## 2020-01-25 MED ORDER — CARVEDILOL 25 MG PO TABS
25.0000 mg | ORAL_TABLET | Freq: Two times a day (BID) | ORAL | Status: DC
  Administered 2020-01-25 – 2020-01-31 (×12): 25 mg via ORAL
  Filled 2020-01-25 (×12): qty 1

## 2020-01-25 MED ORDER — INSULIN ASPART 100 UNIT/ML ~~LOC~~ SOLN
0.0000 [IU] | Freq: Three times a day (TID) | SUBCUTANEOUS | Status: DC
  Administered 2020-01-25: 4 [IU] via SUBCUTANEOUS
  Administered 2020-01-25 – 2020-01-26 (×2): 8 [IU] via SUBCUTANEOUS
  Administered 2020-01-26 (×2): 4 [IU] via SUBCUTANEOUS
  Administered 2020-01-26: 8 [IU] via SUBCUTANEOUS
  Administered 2020-01-27 (×2): 12 [IU] via SUBCUTANEOUS
  Administered 2020-01-27: 4 [IU] via SUBCUTANEOUS
  Administered 2020-01-28: 2 [IU] via SUBCUTANEOUS
  Administered 2020-01-28: 4 [IU] via SUBCUTANEOUS
  Administered 2020-01-28: 2 [IU] via SUBCUTANEOUS
  Administered 2020-01-29 – 2020-01-30 (×6): 4 [IU] via SUBCUTANEOUS
  Administered 2020-01-30: 2 [IU] via SUBCUTANEOUS
  Administered 2020-01-31: 4 [IU] via SUBCUTANEOUS

## 2020-01-25 MED ORDER — INSULIN DETEMIR 100 UNIT/ML ~~LOC~~ SOLN
15.0000 [IU] | Freq: Two times a day (BID) | SUBCUTANEOUS | Status: DC
  Administered 2020-01-25 – 2020-01-26 (×4): 15 [IU] via SUBCUTANEOUS
  Filled 2020-01-25 (×7): qty 0.15

## 2020-01-25 NOTE — Op Note (Signed)
NAME: Sean Villarreal MEDICAL RECORD [id number] ACCOUNT [id number] DATE OF BIRTH:[birth year] FACILITY: [facility] LOCATION: [facility] PHYSICIAN:Kesa Birky Chaya Jan, MD  OPERATIVE REPORT  DATE OF PROCEDURE:  01/24/2020  PREOPERATIVE DIAGNOSIS:  Severe 2-vessel coronary artery disease.  POSTOPERATIVE DIAGNOSIS:  Severe 2-vessel coronary artery disease.  PROCEDURE:   Median sternotomy, extracorporeal circulation, Coronary artery bypass grafting x 2  Left internal mammary artery to LAD,  Saphenous vein graft to OM1 Endoscopic vein harvest right thigh.  SURGEON:  Modesto Charon, MD  ASSISTANT:  Ellwood Handler, PA  ANESTHESIA:  General.  FINDINGS:  OM good quality target.  Vein and mammary good quality.  LAD diffusely diseased, fair quality target.  CLINICAL NOTE:  [name] is a 79 year old gentleman with known coronary artery disease with previous stenting of his LAD and circumflex.  He presented with recurrent angina.  He underwent a cardiac catheterization, which revealed in-stent restenosis  in the LAD and circumflex and also some moderate LAD stenosis.  He was advised to undergo coronary artery bypass grafting.  The indications, risks, benefits, and alternatives were discussed in detail with the patient.  He understood and accepted the  risks and agreed to proceed.  DESCRIPTION OF PROCEDURE:  [name] was brought to the preoperative holding area on 01/24/2020.  Anesthesia placed a Swan-Ganz catheter and an arterial blood pressure monitoring line.  He was taken to the operating room, anesthetized and intubated.  A  Foley catheter was placed.  Intravenous antibiotics were administered.  The chest, abdomen and legs were prepped and draped in the usual sterile fashion.  Dr. Lissa Hoard of Anesthesia performed a transesophageal echocardiography.  It revealed a preserved  left ventricular function.  There was mild aortic stenosis and mild aortic insufficiency.  A timeout was  performed.  A median sternotomy was performed and the left internal mammary artery was harvested using standard technique.  Simultaneously, an incision was made in the medial aspect of the right leg at the level of the knee.  Greater  saphenous vein was harvested from the right thigh endoscopically.  Two thousand units of heparin was administered during the vessel harvest.  The remainder of the full heparin dose was given prior to opening the pericardium.  The pericardium was opened.  The ascending aorta was inspected.  It was normal caliber with no evidence of atherosclerotic disease.  The aorta was cannulated via concentric 2-0 Ethibond pledgeted pursestring sutures.  A dual stage venous cannula was  placed via a pursestring suture in the right atrial appendage.  After confirming adequate anticoagulation with ACT measurement, cardiopulmonary bypass was initiated.  Flows were maintained per protocol.  The patient was cooled to 34 degrees Celsius.  The  coronary arteries were inspected and anastomotic sites were chosen.  Of note, the LAD was diffusely diseased with calcified plaque throughout its entire course until the very apical portion of the vessel.  The conduits were inspected and cut to length.   A foam pad was placed in the pericardium to insulate the heart.  A temperature probe was placed in the myocardial septum and a cardioplegia cannula was placed in the ascending aorta.  The aorta was crossclamped.  The left ventricle was emptied via the aortic root vent.  Cardiac arrest then was achieved with a combination of cold antegrade blood cardioplegia and topical iced saline.  One liter of cardioplegia was administered.  There  was a rapid diastolic arrest.  There was septal cooling to 10 degrees Celsius.  The following distal anastomoses were  performed.  First, a reversed saphenous vein graft was placed end-to-side to the first obtuse marginal branch of the circumflex.  This was a good quality  target.  It bifurcated just beyond the anastomosis and 1.5 mm probe passed into both limbs of the bifurcation.   The vein was anastomosed end-to-side with a running 7-0 Prolene suture.  A probe passed easily distally and proximally at the completion of the anastomosis, cardioplegia was administered down the graft and there was good flow and good hemostasis.   Additional cardioplegia was also administered down the aortic root.  The left internal mammary artery was brought through a window in the pericardium.  The distal end was bevelled.  It was a 2 mm good quality conduit, which had excellent flow.  An arteriotomy was made in the LAD and there was significant plaque proximal  to the arteriotomy.  The arteriotomy was lengthened to bridge this plaque to allow retrograde flow into the more proximal portion of the LAD.  A long anastomosis then was performed with a running 8-0 Prolene suture.  At the completion of the mammary to  LAD anastomosis, the bulldog clamp was briefly removed.  Septal rewarming was noted.  The bulldog clamp was replaced and the mammary pedicle was tacked to the epicardial surface of the heart with 6-0 Prolene sutures.  Additional cardioplegia was administered.  The cardioplegia cannula was removed from the ascending aorta.  The vein graft was cut to length and the proximal end bevelled.  It was then anastomosed end-to-side to a 4.0 mm punch aortotomy with a running 6-0  Prolene suture.  At the completion of the proximal anastomosis lidocaine was administered.  The aortic root was de-aired.  The bulldog clamp was again removed from the left mammary artery.  The aortic crossclamp was removed.  The total crossclamp time  was 44 minutes.  The patient initially fibrillated.  He was defibrillated with a single defibrillation with 10 joules.  He was having some PVCs and bigeminy initially which resolved with an additional 50 mg of lidocaine intravenously.  While rewarming was completed, all  proximal  and distal anastomoses were inspected for hemostasis.  Epicardial pacing wires were placed on the right ventricle and right atrium.  When the patient had rewarmed to a core temperature of 37 degrees Celsius, he was weaned from cardiopulmonary bypass on  the first attempt.  He was in sinus rhythm with no inotropic support at the time of separation from bypass.  Total bypass time was 77 minutes.  Post-bypass transesophageal echocardiography was unchanged from the pre-bypass study.  The initial cardiac  index was greater than 2 liters per minute per meter squared.  The patient remained hemodynamically stable throughout the post-bypass period.  A test dose of protamine was administered and was well tolerated.  The atrial and aortic cannulae were removed.  The remainder of the protamine was administered without incident.  The chest was irrigated with warm saline.  Hemostasis was achieved.  Left  pleural and mediastinal chest tubes were placed through separate subcostal incisions and secured with a #1 silk sutures.  The pericardium was reapproximated over the ascending aorta and base of the heart with interrupted 3-0 silk sutures.  It came  together easily without tension.  The sternum was closed with a combination of single and double heavy gauge stainless steel wires.  Pectoralis fascia, subcutaneous tissue and skin were closed in standard fashion.  All sponge, needle and instrument  counts were correct at the end  of the procedure.  The patient was taken from the operating room to the Surgical Intensive Care Unit, intubated and in good condition.  HN/NUANCE  D:01/24/2020 T:01/25/2020 JOB:[id number]

## 2020-01-25 NOTE — Plan of Care (Signed)
  Problem: Education: Goal: Ability to demonstrate management of disease process will improve Outcome: Progressing Goal: Ability to verbalize understanding of medication therapies will improve Outcome: Progressing Goal: Individualized Educational Video(s) Outcome: Progressing   Problem: Activity: Goal: Capacity to carry out activities will improve Outcome: Progressing   Problem: Cardiac: Goal: Ability to achieve and maintain adequate cardiopulmonary perfusion will improve Outcome: Progressing   Problem: Education: Goal: Knowledge of General Education information will improve Description: Including pain rating scale, medication(s)/side effects and non-pharmacologic comfort measures Outcome: Progressing   Problem: Health Behavior/Discharge Planning: Goal: Ability to manage health-related needs will improve Outcome: Progressing   Problem: Clinical Measurements: Goal: Ability to maintain clinical measurements within normal limits will improve Outcome: Progressing Goal: Will remain free from infection Outcome: Progressing Goal: Diagnostic test results will improve Outcome: Progressing Goal: Respiratory complications will improve Outcome: Progressing Goal: Cardiovascular complication will be avoided Outcome: Progressing   Problem: Activity: Goal: Risk for activity intolerance will decrease Outcome: Progressing

## 2020-01-25 NOTE — Plan of Care (Signed)
  Problem: Education: Goal: Ability to demonstrate management of disease process will improve Outcome: Progressing Goal: Ability to verbalize understanding of medication therapies will improve Outcome: Progressing   Problem: Activity: Goal: Capacity to carry out activities will improve Outcome: Progressing   Problem: Cardiac: Goal: Ability to achieve and maintain adequate cardiopulmonary perfusion will improve Outcome: Progressing   Problem: Education: Goal: Knowledge of General Education information will improve Description: Including pain rating scale, medication(s)/side effects and non-pharmacologic comfort measures Outcome: Progressing   Problem: Clinical Measurements: Goal: Ability to maintain clinical measurements within normal limits will improve Outcome: Progressing Goal: Will remain free from infection Outcome: Progressing Goal: Diagnostic test results will improve Outcome: Progressing Goal: Respiratory complications will improve Outcome: Progressing Goal: Cardiovascular complication will be avoided Outcome: Progressing   Problem: Clinical Measurements: Goal: Will remain free from infection Outcome: Progressing   Problem: Clinical Measurements: Goal: Diagnostic test results will improve Outcome: Progressing   Problem: Clinical Measurements: Goal: Respiratory complications will improve Outcome: Progressing

## 2020-01-25 NOTE — Progress Notes (Signed)
Progress Note  Patient Name: Sean Villarreal Date of Encounter: 01/25/2020  Primary Cardiologist: Rozann Lesches, MD   Subjective   79 yo M with hx of CAD and DES to LAD and  Multiple LCx system (2014, 2018); mild AS, DM with HTN and HLD who presented 12/27 for angina. 01/22/20 LHC showing new prox LAD and ISR of LCx stents- normal EF-> Recommended for CABG, performed 01/24/20 (LIMA-LAD, SVR to OM).    Patient notes that he is doing well, considering he just went through surgery.    No chest pain or pressure .  No SOB/DOE and no PND/Orthopnea.  No palpitations.  Notes chest tube site pain  Inpatient Medications    Scheduled Meds:  acetaminophen  1,000 mg Oral Q6H   Or   acetaminophen (TYLENOL) oral liquid 160 mg/5 mL  1,000 mg Per Tube Q6H   aspirin EC  325 mg Oral Daily   Or   aspirin  324 mg Per Tube Daily   atorvastatin  80 mg Oral QHS   bisacodyl  10 mg Oral Daily   Or   bisacodyl  10 mg Rectal Daily   carvedilol  25 mg Oral BID WC   Chlorhexidine Gluconate Cloth  6 each Topical Daily   docusate sodium  200 mg Oral Daily   enoxaparin (LOVENOX) injection  40 mg Subcutaneous QHS   famotidine (PEPCID) IV  20 mg Intravenous Q12H   furosemide  20 mg Intravenous Once   insulin aspart  0-24 Units Subcutaneous Q4H   insulin detemir  15 Units Subcutaneous BID   mouth rinse  15 mL Mouth Rinse QID   metoCLOPramide (REGLAN) injection  10 mg Intravenous Q6H   [START ON 01/26/2020] pantoprazole  40 mg Oral Daily   sodium chloride flush  3 mL Intravenous Q12H   sodium chloride flush  3 mL Intravenous Q12H   tamsulosin  0.4 mg Oral BID   Continuous Infusions:  sodium chloride Stopped (01/25/20 0649)   sodium chloride     sodium chloride 20 mL/hr at 01/24/20 1210   albumin human 12.5 g (01/24/20 1600)   dexmedetomidine (PRECEDEX) IV infusion Stopped (01/24/20 1625)   insulin 1.1 mL/hr at 01/25/20 0700   lactated ringers     lactated ringers     lactated ringers 20 mL/hr at  01/25/20 0700   levofloxacin (LEVAQUIN) IV     nitroGLYCERIN 20 mcg/min (01/25/20 0700)   phenylephrine (NEO-SYNEPHRINE) Adult infusion Stopped (01/24/20 2115)   potassium chloride 50 mL/hr at 01/25/20 0700   potassium chloride     PRN Meds: sodium chloride, albumin human, dextrose, lactated ringers, metoprolol tartrate, midazolam, morphine injection, ondansetron (ZOFRAN) IV, oxyCODONE, sodium chloride flush, traMADol   Vital Signs    Vitals:   01/25/20 0545 01/25/20 0600 01/25/20 0700 01/25/20 0715  BP:  135/62 132/73   Pulse: 92 87 98 78  Resp: 19 (!) 28 (!) 25 20  Temp: 99.14 F (37.3 C) 98.96 F (37.2 C) 98.78 F (37.1 C) 98.78 F (37.1 C)  TempSrc:      SpO2: 96% 97% 98% 97%  Weight:      Height:        Intake/Output Summary (Last 24 hours) at 01/25/2020 0746 Last data filed at 01/25/2020 0700 Gross per 24 hour  Intake 4214.73 ml  Output 4070 ml  Net 144.73 ml   Filed Weights   01/23/20 0331 01/24/20 0447 01/25/20 0500  Weight: 95.1 kg 92.9 kg 98.2 kg  Telemetry    Sinus rhythm with PVCs isolated and in couplets with occasional A pacing - Personally Reviewed  ECG    01/25/20: NSR, RBBB, Rate 91 - Personally Reviewed  Physical Exam   GEN: No acute distress.   Neck: R IJ Swan in place Cardiac: RRR, no murmurs, rubs, or gallops.  Respiratory: Clear to auscultation bilaterally. GI: Soft, nontender, non-distended  MS: No edema; No deformity. Neuro:  Nonfocal  Psych: Normal affect  INTEGUMENT: no erythema around chest tube  Labs    Chemistry Recent Labs  Lab 01/20/20 2045 01/22/20 0637 01/24/20 0441 01/24/20 0817 01/24/20 1119 01/24/20 1237 01/24/20 1717 01/24/20 1830 01/25/20 0426  NA 141   < > 138   < > 139   < > 140 135 136  K 3.5   < > 3.6   < > 3.6   < > 4.2 4.1 3.5  CL 101   < > 102   < > 101  --   --  104 103  CO2 27   < > 27  --   --   --   --  21* 23  GLUCOSE 161*   < > 197*   < > 170*  --   --  144* 116*  BUN 13   < > 11   < >  10  --   --  9 7*  CREATININE 0.86   < > 0.78   < > 0.40*  --   --  0.64 0.64  CALCIUM 9.0   < > 8.8*  --   --   --   --  7.7* 7.7*  PROT 7.6  --   --   --   --   --   --   --   --   ALBUMIN 4.3  --   --   --   --   --   --   --   --   AST 20  --   --   --   --   --   --   --   --   ALT 23  --   --   --   --   --   --   --   --   ALKPHOS 47  --   --   --   --   --   --   --   --   BILITOT 0.7  --   --   --   --   --   --   --   --   GFRNONAA >60   < > >60  --   --   --   --  >60 >60  ANIONGAP 13   < > 9  --   --   --   --  10 10   < > = values in this interval not displayed.     Hematology Recent Labs  Lab 01/24/20 1237 01/24/20 1534 01/24/20 1717 01/24/20 1830 01/25/20 0426  WBC 15.3*  --   --  14.2* 12.6*  RBC 3.81*  --   --  3.74* 3.63*  HGB 12.0*  12.2*   < > 11.2* 12.0* 11.3*  HCT 35.9*  36.0*   < > 33.0* 34.9* 33.9*  MCV 94.2  --   --  93.3 93.4  MCH 31.5  --   --  32.1 31.1  MCHC 33.4  --   --  34.4 33.3  RDW  12.3  --   --  12.5 12.3  PLT 108*  --   --  129* 118*   < > = values in this interval not displayed.    Cardiac EnzymesNo results for input(s): TROPONINI in the last 168 hours. No results for input(s): TROPIPOC in the last 168 hours.   BNPNo results for input(s): BNP, PROBNP in the last 168 hours.   DDimer No results for input(s): DDIMER in the last 168 hours.   Radiology    DG Chest Port 1 View  Result Date: 01/25/2020 CLINICAL DATA:  79 year old male postoperative day 1 status post CABG. EXAM: PORTABLE CHEST 1 VIEW COMPARISON:  Portable chest 01/24/2020 and earlier. FINDINGS: Portable AP semi upright view at 0523 hours. Extubated. Enteric tube removed. Right IJ Swan-Ganz catheter remains in place, tip near the right hilum now. Mediastinal and left chest tubes remain in place. Lower lung volumes. No pneumothorax or pulmonary edema. Small left pleural effusion and increased bibasilar atelectasis. Stable cardiac size and mediastinal contours. Negative  visible bowel gas pattern. IMPRESSION: 1. Extubated and enteric tube removed. Swan-Ganz catheter tip at the right hilum. Stable chest and mediastinal tubes. 2. No pneumothorax. Lower lung volumes with increased bibasilar atelectasis and small left pleural effusion. Electronically Signed   By: Genevie Ann M.D.   On: 01/25/2020 07:32   DG Chest Port 1 View  Result Date: 01/24/2020 CLINICAL DATA:  Post CABG. EXAM: PORTABLE CHEST 1 VIEW COMPARISON:  Chest x-ray dated January 20, 2020. FINDINGS: Endotracheal tube in position with the tip 3.5 cm above the level of the carina. Enteric tube in the stomach. Right internal jugular Swan-Ganz catheter with the tip in the main pulmonary outflow tract. Mediastinal and left chest tubes are in good position. Interval CABG. Normal heart size. Low lung volumes with bronchovascular crowding. Left basilar opacity with small left pleural effusion. No pneumothorax. No acute osseous abnormality. IMPRESSION: 1. Appropriately positioned lines and tubes. No pneumothorax. 2. Left basilar atelectasis with small left pleural effusion. Electronically Signed   By: Titus Dubin M.D.   On: 01/24/2020 12:40   ECHO INTRAOPERATIVE TEE  Result Date: 01/24/2020  *INTRAOPERATIVE TRANSESOPHAGEAL REPORT *  Patient Name:   KARMEL REHFELDT Date of Exam: 01/24/2020 Medical Rec #:  [id number]         Height:       68.0 in Accession #:    [id number]        Weight:       204.8 lb Date of Birth:  [birth year]          BSA:          2.06 m Patient Age:    74 years          BP:           138/79 mmHg Patient Gender: M                 HR:           71 bpm. Exam Location:  Inpatient Transesophogeal exam was perform intraoperatively during surgical procedure. Patient was closely monitored under general anesthesia during the entirety of examination. Indications:     CABG Performing Phys: [place] Diagnosing Phys: Nolon Nations MD Complications: No known complications during this procedure.  POST-OP IMPRESSIONS - Left Ventricle: The left ventricle is unchanged from pre-bypass. - Right Ventricle: The right ventricle appears unchanged from pre-bypass. - Aorta: The aorta appears unchanged from pre-bypass. - Left Atrium: The left  atrium appears unchanged from pre-bypass. - Left Atrial Appendage: The left atrial appendage appears unchanged from pre-bypass. - Aortic Valve: The aortic valve appears unchanged from pre-bypass. - Mitral Valve: The mitral valve appears unchanged from pre-bypass. - Tricuspid Valve: The tricuspid valve appears unchanged from pre-bypass. - Interatrial Septum: The interatrial septum appears unchanged from pre-bypass. - Interventricular Septum: The interventricular septum appears unchanged from pre-bypass. - Pericardium: The pericardium appears unchanged from pre-bypass. PRE-OP FINDINGS  Left Ventricle: The left ventricle has normal systolic function, with an ejection fraction of 55-60%. The cavity size was normal. There is moderate concentric left ventricular hypertrophy. Right Ventricle: The right ventricle has normal systolic function. The cavity was mildly enlarged. There is no increase in right ventricular wall thickness. Left Atrium: Left atrial size was normal in size. The left atrial appendage is well visualized and there is no evidence of thrombus present. Right Atrium: Right atrial size was normal in size. Interatrial Septum: No atrial level shunt detected by color flow Doppler. Pericardium: There is no evidence of pericardial effusion. Mitral Valve: The mitral valve is normal in structure. Mild thickening of the mitral valve leaflet. Mild calcification of the mitral valve leaflet. Mitral valve regurgitation is mild by color flow Doppler. There is no evidence of mitral valve vegetation. Tricuspid Valve: The tricuspid valve was normal in structure. Tricuspid valve regurgitation is mild by color flow Doppler. There is no evidence of tricuspid valve vegetation. Aortic Valve: The  aortic valve is tricuspid Aortic valve regurgitation is mild by color flow Doppler. There is mild stenosis of the aortic valve. There is no evidence of a vegetation on the aortic valve. Pulmonic Valve: The pulmonic valve was normal in structure. Pulmonic valve regurgitation is mild by color flow Doppler.  Nolon Nations MD Electronically signed by Nolon Nations MD Signature Date/Time: 01/24/2020/6:51:55 PM    Final     Cardiac Studies   Left heart cath 01/22/20: Severe calcific coronary disease involving the left anterior descending, circumflex, and left main. 40 to 50% eccentric distal left main Bulky eccentric 80% ostial to proximal LAD showing progression compared to 2020.  Moderate mid to distal diffuse LAD disease. Diffuse ostial to proximal in-stent restenosis in circumflex which has 2 layers of stent.  Moderate mid to distal circumflex 50% narrowing. Occlusion of ramus intermedius, very small and fills by left to left collaterals. Dominant right coronary with luminal irregularities but no high-grade obstruction. Normal anterior wall motion.  Estimated > EF 50%.  Inferior wall not well seen.  LVEDP is normal.     Diagnostic Dominance: Right        Echo 01/21/20:  1. Left ventricular ejection fraction, by estimation, is 55 to 60%. The  left ventricle has normal function. The left ventricle has no regional  wall motion abnormalities. There is mild concentric left ventricular  hypertrophy. Left ventricular diastolic  parameters are consistent with Grade I diastolic dysfunction (impaired  relaxation).   2. Right ventricular systolic function is normal. The right ventricular  size is normal.   3. Left atrial size was mildly dilated.   4. The mitral valve is abnormal. Trivial mitral valve regurgitation.  Moderate mitral annular calcification.   5. The aortic valve is abnormal. There is moderate calcification of the  aortic valve. There is moderate thickening of the aortic valve.  Aortic  valve regurgitation is not visualized. Mild aortic valve stenosis.   Patient Profile     79 y.o. male prior CAD with anginal CP now s/p CABG  Assessment & Plan    Coronary Artery Disease; Obstructive HLD DM with HTN - asymptomatic s/p CABG - anatomy: new LIMA to LAD and SVG to OM as above - continue ASA 81 mg; Plavix start per CT Surgery - continue statin, goal LDL < 70 (at goal) - Agree with Coreg restart - If CP resumes off nitro drip; would return home IMDUR 30 mg - 01/26/20 or if BP continue to be a problem would resume lisinopril 40 mg PO Daily as long as stable creatinine - planned for cardiac rehab  Aortic Stenosis Chronic Mild Diastolic Dysfunction - agree with lasix restart; home dose is q 48 hours - mild, stable  Discussed with patient and nursing  For questions or updates, please contact [facility] Please consult [email or web address] for contact info under Cardiology/STEMI.      Signed, Christell Constant, MD  01/25/2020, 7:46 AM

## 2020-01-25 NOTE — Progress Notes (Signed)
1 Day Post-Op Procedure(s) (LRB): CORONARY ARTERY BYPASS GRAFTING (CABG), ON PUMP, TIMES TWO, USING LEFT INTERNAL MAMMARY ARTERY AND ENDOSCOPICALLY HARVESTED RIGHT GREATER SAPHENOUS VEIN (N/A) TRANSESOPHAGEAL ECHOCARDIOGRAM (TEE) (N/A) Subjective: Some soreness, pain left back with deep inspiration (chest tube)  Objective: Vital signs in last 24 hours: Temp:  [97.34 F (36.3 C)-100.22 F (37.9 C)] 98.78 F (37.1 C) (12/31 0715) Pulse Rate:  [76-104] 78 (12/31 0715) Cardiac Rhythm: Atrial paced;Normal sinus rhythm (12/31 0000) Resp:  [12-28] 20 (12/31 0715) BP: (104-145)/(49-82) 132/73 (12/31 0700) SpO2:  [92 %-100 %] 97 % (12/31 0715) Arterial Line BP: (96-169)/(42-68) 151/51 (12/31 0715) FiO2 (%):  [40 %-50 %] 40 % (12/30 1501) Weight:  [98.2 kg] 98.2 kg (12/31 0500)  Hemodynamic parameters for last 24 hours: PAP: (13-35)/(-3-13) 24/8 CVP:  [4 mmHg] 4 mmHg CO:  [4.2 L/min-6.9 L/min] 6.5 L/min CI:  [2 L/min/m2-3.3 L/min/m2] 3.1 L/min/m2  Intake/Output from previous day: 12/30 0701 - 12/31 0700 In: 4614.7 [P.O.:150; I.V.:2785.3; Blood:425; IV Piggyback:1254.5] Out: 4070 [Urine:2670; Blood:700; Chest Tube:700] Intake/Output this shift: No intake/output data recorded.  General appearance: alert, cooperative and no distress Neurologic: intact Heart: regular rate and rhythm Lungs: diminished breath sounds bibasilar Abdomen: normal findings: soft, non-tender  Lab Results: Recent Labs    01/24/20 1830 01/25/20 0426  WBC 14.2* 12.6*  HGB 12.0* 11.3*  HCT 34.9* 33.9*  PLT 129* 118*   BMET:  Recent Labs    01/24/20 1830 01/25/20 0426  NA 135 136  K 4.1 3.5  CL 104 103  CO2 21* 23  GLUCOSE 144* 116*  BUN 9 7*  CREATININE 0.64 0.64  CALCIUM 7.7* 7.7*    PT/INR:  Recent Labs    01/24/20 1237  LABPROT 16.3*  INR 1.4*   ABG    Component Value Date/Time   PHART 7.374 01/24/2020 1717   HCO3 24.3 01/24/2020 1717   TCO2 26 01/24/2020 1717   ACIDBASEDEF 1.0  01/24/2020 1717   O2SAT 97.0 01/24/2020 1717   CBG (last 3)  Recent Labs    01/25/20 0011 01/25/20 0219 01/25/20 0423  GLUCAP 132* 128* 108*    Assessment/Plan: S/P Procedure(s) (LRB): CORONARY ARTERY BYPASS GRAFTING (CABG), ON PUMP, TIMES TWO, USING LEFT INTERNAL MAMMARY ARTERY AND ENDOSCOPICALLY HARVESTED RIGHT GREATER SAPHENOUS VEIN (N/A) TRANSESOPHAGEAL ECHOCARDIOGRAM (TEE) (N/A) -POD # 1 Doing well CV- in SR, hypertensive, still on NTG drip- restart coreg 25 BID  Cardiac index > 3- dc Swan and A line  ASA for now, resume Plavix after pacing wires out RESP- IS for basilar atelectasis RENAL- creatinine OK, supplement K  Will give low dose Lasix this AM  On Flomax ENDO - CBG well controlled, transition from insulin drip to levemir + SSI  Restart PO meds tomorrow Anemia- mild, follow Thrombocytopenia- mild, follow SCD + enoxaparin for DVT prophylaxis GI- advance diet as tolerated DC chest tubes Cardiac rehab, mobilize   LOS: 4 days    Loreli Slot 01/25/2020

## 2020-01-26 ENCOUNTER — Inpatient Hospital Stay (HOSPITAL_COMMUNITY): Payer: Medicare Other

## 2020-01-26 DIAGNOSIS — I25110 Atherosclerotic heart disease of native coronary artery with unstable angina pectoris: Secondary | ICD-10-CM | POA: Diagnosis not present

## 2020-01-26 LAB — CBC
HCT: 32.1 % — ABNORMAL LOW (ref 39.0–52.0)
Hemoglobin: 10.7 g/dL — ABNORMAL LOW (ref 13.0–17.0)
MCH: 31.6 pg (ref 26.0–34.0)
MCHC: 33.3 g/dL (ref 30.0–36.0)
MCV: 94.7 fL (ref 80.0–100.0)
Platelets: 100 10*3/uL — ABNORMAL LOW (ref 150–400)
RBC: 3.39 MIL/uL — ABNORMAL LOW (ref 4.22–5.81)
RDW: 12.5 % (ref 11.5–15.5)
WBC: 14.2 10*3/uL — ABNORMAL HIGH (ref 4.0–10.5)
nRBC: 0 % (ref 0.0–0.2)

## 2020-01-26 LAB — BASIC METABOLIC PANEL
Anion gap: 7 (ref 5–15)
BUN: 6 mg/dL — ABNORMAL LOW (ref 8–23)
CO2: 25 mmol/L (ref 22–32)
Calcium: 7.7 mg/dL — ABNORMAL LOW (ref 8.9–10.3)
Chloride: 100 mmol/L (ref 98–111)
Creatinine, Ser: 0.64 mg/dL (ref 0.61–1.24)
GFR, Estimated: >60 mL/min (ref >60)
Glucose, Bld: 185 mg/dL — ABNORMAL HIGH (ref 70–99)
Potassium: 3.5 mmol/L (ref 3.5–5.1)
Sodium: 132 mmol/L — ABNORMAL LOW (ref 135–145)

## 2020-01-26 LAB — GLUCOSE, CAPILLARY
Glucose-Capillary: 199 mg/dL — ABNORMAL HIGH (ref 70–99)
Glucose-Capillary: 191 mg/dL — ABNORMAL HIGH (ref 70–99)
Glucose-Capillary: 213 mg/dL — ABNORMAL HIGH (ref 70–99)
Glucose-Capillary: 205 mg/dL — ABNORMAL HIGH (ref 70–99)

## 2020-01-26 MED ORDER — POTASSIUM CHLORIDE CRYS ER 20 MEQ PO TBCR
20.0000 meq | EXTENDED_RELEASE_TABLET | ORAL | Status: AC
  Administered 2020-01-26 (×2): 20 meq via ORAL
  Filled 2020-01-26 (×2): qty 1

## 2020-01-26 MED ORDER — SODIUM CHLORIDE 0.9 % IV SOLN: 250.0000 mL | INTRAVENOUS | Status: DC | PRN

## 2020-01-26 MED ORDER — SODIUM CHLORIDE 0.9% FLUSH: 3.0000 mL | INTRAVENOUS | Status: DC | PRN

## 2020-01-26 MED ORDER — POTASSIUM CHLORIDE CRYS ER 20 MEQ PO TBCR
40.0000 meq | EXTENDED_RELEASE_TABLET | Freq: Every day | ORAL | Status: DC
  Administered 2020-01-26 – 2020-01-31 (×6): 40 meq via ORAL
  Filled 2020-01-26 (×6): qty 2

## 2020-01-26 MED ORDER — ~~LOC~~ CARDIAC SURGERY, PATIENT & FAMILY EDUCATION: Freq: Once | Status: AC

## 2020-01-26 MED ORDER — FUROSEMIDE 40 MG PO TABS
40.0000 mg | ORAL_TABLET | Freq: Every day | ORAL | Status: DC
  Administered 2020-01-26 – 2020-01-30 (×5): 40 mg via ORAL
  Filled 2020-01-26 (×5): qty 1

## 2020-01-26 MED ORDER — SODIUM CHLORIDE 0.9% FLUSH
3.0000 mL | Freq: Two times a day (BID) | INTRAVENOUS | Status: DC
  Administered 2020-01-26 – 2020-01-30 (×9): 3 mL via INTRAVENOUS

## 2020-01-26 NOTE — Progress Notes (Signed)
Mobility Specialist: Progress Note   01/26/20 1527  Mobility  Activity Ambulated in hall  Level of Assistance Modified independent, requires aide device or extra time  Assistive Device Front wheel walker  Distance Ambulated (ft) 430 ft  Mobility Response Tolerated well  Mobility performed by Mobility specialist  Bed Position Chair  $Mobility charge 1 Mobility   Pre-Mobility: 87 HR, 121/66 BP, 95% SpO2 During Mobility: 115 HR Post-Mobility: 93 HR, 115/72 BP, 96% SpO2  Pt asx during ambulation. Pt very eager to perform mobility and work with staff. Encouraged pt to walk at least one more time today.   [facility] Laetitia Schnepf Mobility Specialist

## 2020-01-26 NOTE — Progress Notes (Signed)
      [address]       [place] [address]             [phone]                 2 Days Post-Op Procedure(s) (LRB): CORONARY ARTERY BYPASS GRAFTING (CABG), ON PUMP, TIMES TWO, USING LEFT INTERNAL MAMMARY ARTERY AND ENDOSCOPICALLY HARVESTED RIGHT GREATER SAPHENOUS VEIN (N/A) TRANSESOPHAGEAL ECHOCARDIOGRAM (TEE) (N/A)    Events: No events overnight Run of afib overnight _______________________________________________________________ Vitals: BP 125/63   Pulse 91   Temp 98.9 F (37.2 C) (Oral)   Resp (!) 21   Ht 5\' 8"  (1.727 m)   Wt 97.8 kg   SpO2 93%   BMI 32.78 kg/m   - Neuro: alert NAD  - Cardiovascular: sinus  Drips: none.   PAP: (21-34)/(5-15) 34/13 CO:  [6.8 L/min] 6.8 L/min CI:  [3.3 L/min/m2] 3.3 L/min/m2  - Pulm: EWOB    ABG    Component Value Date/Time   PHART 7.374 01/24/2020 1717   PCO2ART 41.7 01/24/2020 1717   PO2ART 94 01/24/2020 1717   HCO3 24.3 01/24/2020 1717   TCO2 26 01/24/2020 1717   ACIDBASEDEF 1.0 01/24/2020 1717   O2SAT 97.0 01/24/2020 1717    - Abd: soft - Extremity: trace edema  .Intake/Output      12/31 0701 01/01 0700 01/01 0701 01/02 0700   P.O.     I.V. (mL/kg) 502.1 (5.1)    Blood     IV Piggyback 391.5    Total Intake(mL/kg) 893.7 (9.1)    Urine (mL/kg/hr) 1910 (0.8)    Blood     Chest Tube 80    Total Output 1990    Net -1096.4            _______________________________________________________________ Labs: CBC Latest Ref Rng & Units 01/26/2020 01/25/2020 01/25/2020  WBC 4.0 - 10.5 K/uL 14.2(H) 15.3(H) 12.6(H)  Hemoglobin 13.0 - 17.0 g/dL 10.7(L) 12.2(L) 11.3(L)  Hematocrit 39.0 - 52.0 % 32.1(L) 34.3(L) 33.9(L)  Platelets 150 - 400 K/uL 100(L) 124(L) 118(L)   CMP Latest Ref Rng & Units 01/26/2020 01/25/2020 01/25/2020  Glucose 70 - 99 mg/dL 01/27/2020) 540(G) 867(Y)  BUN 8 - 23 mg/dL 6(L) 9 7(L)  Creatinine 0.61 - 1.24 mg/dL 195(K 9.32 6.71  Sodium 135 - 145 mmol/L 132(L) 133(L) 136  Potassium  3.5 - 5.1 mmol/L 3.5 3.9 3.5  Chloride 98 - 111 mmol/L 100 100 103  CO2 22 - 32 mmol/L 25 23 23   Calcium 8.9 - 10.3 mg/dL 7.7(L) 7.9(L) 7.7(L)  Total Protein 6.5 - 8.1 g/dL - - -  Total Bilirubin 0.3 - 1.2 mg/dL - - -  Alkaline Phos 38 - 126 U/L - - -  AST 15 - 41 U/L - - -  ALT 0 - 44 U/L - - -    CXR: clear  _______________________________________________________________  Assessment and Plan: POD 2 s/p CABG  Neuro: pain controlled CV: on high dose coreg, asp, statin Pulm: continue pulm toilet Renal: will diurese today GI: on diet Heme: stable ID: [email or web address] Endo: SSI Dispo: floor today   2.45 01/26/2020 9:37 AM

## 2020-01-26 NOTE — Progress Notes (Signed)
Pt admitted to [facility] from [facility].  Pt is A&O X4 and neuro intact. Vitals taken and all within normal range.  CHG bath completed.  Pt placed on telemetry and CCMD notified.  Pt is currently comfortable and not in pain.

## 2020-01-26 NOTE — Progress Notes (Signed)
Progress Note  Patient Name: Sean Villarreal Date of Encounter: 01/26/2020  Primary Cardiologist: Rozann Lesches, MD  Subjective   Chest soreness of appropriate degree, no obvious angina, no breathlessness or palpitations.  Inpatient Medications    Scheduled Meds: . acetaminophen  1,000 mg Oral Q6H   Or  . acetaminophen (TYLENOL) oral liquid 160 mg/5 mL  1,000 mg Per Tube Q6H  . aspirin EC  325 mg Oral Daily   Or  . aspirin  324 mg Per Tube Daily  . atorvastatin  80 mg Oral QHS  . bisacodyl  10 mg Oral Daily   Or  . bisacodyl  10 mg Rectal Daily  . carvedilol  25 mg Oral BID WC  . Chlorhexidine Gluconate Cloth  6 each Topical Daily  . docusate sodium  200 mg Oral Daily  . enoxaparin (LOVENOX) injection  40 mg Subcutaneous QHS  . insulin aspart  0-24 Units Subcutaneous TID WC & HS  . insulin detemir  15 Units Subcutaneous BID  . mouth rinse  15 mL Mouth Rinse BID  . pantoprazole  40 mg Oral Daily  . potassium chloride  20 mEq Oral Q4H  . sodium chloride flush  3 mL Intravenous Q12H  . sodium chloride flush  3 mL Intravenous Q12H  . tamsulosin  0.4 mg Oral BID   Continuous Infusions: . sodium chloride    . lactated ringers    . lactated ringers    . lactated ringers 20 mL/hr at 01/26/20 0600  . nitroGLYCERIN Stopped (01/25/20 0921)  . phenylephrine (NEO-SYNEPHRINE) Adult infusion Stopped (01/24/20 2115)   PRN Meds: dextrose, lactated ringers, metoprolol tartrate, midazolam, morphine injection, ondansetron (ZOFRAN) IV, oxyCODONE, sodium chloride flush, traMADol   Vital Signs    Vitals:   01/26/20 0500 01/26/20 0600 01/26/20 0700 01/26/20 0741  BP: 125/75 127/70 125/63   Pulse: 97 95 91   Resp: (!) 24 (!) 21 (!) 21   Temp:    98.9 F (37.2 C)  TempSrc:    Oral  SpO2: 95% 93% 93%   Weight: 97.8 kg     Height:        Intake/Output Summary (Last 24 hours) at 01/26/2020 0759 Last data filed at 01/26/2020 0600 Gross per 24 hour  Intake 893.65 ml  Output 1990  ml  Net -1096.35 ml   Filed Weights   01/24/20 0447 01/25/20 0500 01/26/20 0500  Weight: 92.9 kg 98.2 kg 97.8 kg    Telemetry    Sinus rhythm and sinus tachycardia.  Personally reviewed.  ECG    An ECG dated 01/25/2020 was personally reviewed today and demonstrated:  Sinus rhythm with right bundle branch block.  Physical Exam   GEN:  Elderly male.  No acute distress.   Neck: No JVD. Cardiac: RRR, no gallop.  Thorax: Sternal dressing intact. Respiratory: Nonlabored. Clear to auscultation bilaterally. MS: No edema; No deformity.  Labs    Chemistry Recent Labs  Lab 01/20/20 2045 01/22/20 0637 01/25/20 0426 01/25/20 1630 01/26/20 0519  NA 141   < > 136 133* 132*  K 3.5   < > 3.5 3.9 3.5  CL 101   < > 103 100 100  CO2 27   < > 23 23 25   GLUCOSE 161*   < > 116* 202* 185*  BUN 13   < > 7* 9 6*  CREATININE 0.86   < > 0.64 0.68 0.64  CALCIUM 9.0   < > 7.7* 7.9* 7.7*  PROT  7.6  --   --   --   --   ALBUMIN 4.3  --   --   --   --   AST 20  --   --   --   --   ALT 23  --   --   --   --   ALKPHOS 47  --   --   --   --   BILITOT 0.7  --   --   --   --   GFRNONAA >60   < > >60 >60 >60  ANIONGAP 13   < > 10 10 7    < > = values in this interval not displayed.     Hematology Recent Labs  Lab 01/25/20 0426 01/25/20 1630 01/26/20 0519  WBC 12.6* 15.3* 14.2*  RBC 3.63* 3.71* 3.39*  HGB 11.3* 12.2* 10.7*  HCT 33.9* 34.3* 32.1*  MCV 93.4 92.5 94.7  MCH 31.1 32.9 31.6  MCHC 33.3 35.6 33.3  RDW 12.3 12.5 12.5  PLT 118* 124* 100*    Cardiac Enzymes Recent Labs  Lab 01/20/20 2045 01/20/20 2245  TROPONINIHS 8 10    Radiology    DG Chest Port 1 View  Result Date: 01/26/2020 CLINICAL DATA:  80 year old male postoperative day 2 status post CABG. EXAM: PORTABLE CHEST 1 VIEW COMPARISON:  Portable chest 01/25/2020 and earlier. FINDINGS: Portable AP semi upright view at 0558 hours. Swan-Ganz catheter removed. Right IJ introducer sheath remains. Mediastinal and chest  tubes removed. Stable somewhat low lung volumes. No pneumothorax identified. Small left pleural effusion. No pulmonary edema. Stable cardiac size and mediastinal contours. Negative visible bowel gas pattern. IMPRESSION: 1. Right IJ introducer sheath remains, other lines and tubes removed. 2. No pneumothorax identified.  Small left pleural effusion. Electronically Signed   By: Odessa FlemingH  Hall M.D.   On: 01/26/2020 07:49   DG Chest Port 1 View  Result Date: 01/25/2020 CLINICAL DATA:  80 year old male postoperative day 1 status post CABG. EXAM: PORTABLE CHEST 1 VIEW COMPARISON:  Portable chest 01/24/2020 and earlier. FINDINGS: Portable AP semi upright view at 0523 hours. Extubated. Enteric tube removed. Right IJ Swan-Ganz catheter remains in place, tip near the right hilum now. Mediastinal and left chest tubes remain in place. Lower lung volumes. No pneumothorax or pulmonary edema. Small left pleural effusion and increased bibasilar atelectasis. Stable cardiac size and mediastinal contours. Negative visible bowel gas pattern. IMPRESSION: 1. Extubated and enteric tube removed. Swan-Ganz catheter tip at the right hilum. Stable chest and mediastinal tubes. 2. No pneumothorax. Lower lung volumes with increased bibasilar atelectasis and small left pleural effusion. Electronically Signed   By: Odessa FlemingH  Hall M.D.   On: 01/25/2020 07:32   DG Chest Port 1 View  Result Date: 01/24/2020 CLINICAL DATA:  Post CABG. EXAM: PORTABLE CHEST 1 VIEW COMPARISON:  Chest x-ray dated January 20, 2020. FINDINGS: Endotracheal tube in position with the tip 3.5 cm above the level of the carina. Enteric tube in the stomach. Right internal jugular Swan-Ganz catheter with the tip in the main pulmonary outflow tract. Mediastinal and left chest tubes are in good position. Interval CABG. Normal heart size. Low lung volumes with bronchovascular crowding. Left basilar opacity with small left pleural effusion. No pneumothorax. No acute osseous abnormality.  IMPRESSION: 1. Appropriately positioned lines and tubes. No pneumothorax. 2. Left basilar atelectasis with small left pleural effusion. Electronically Signed   By: Obie DredgeWilliam T Derry M.D.   On: 01/24/2020 12:40    Cardiac Studies  Echocardiogram 01/21/2020: 1. Left ventricular ejection fraction, by estimation, is 55 to 60%. The  left ventricle has normal function. The left ventricle has no regional  wall motion abnormalities. There is mild concentric left ventricular  hypertrophy. Left ventricular diastolic  parameters are consistent with Grade I diastolic dysfunction (impaired  relaxation).  2. Right ventricular systolic function is normal. The right ventricular  size is normal.  3. Left atrial size was mildly dilated.  4. The mitral valve is abnormal. Trivial mitral valve regurgitation.  Moderate mitral annular calcification.  5. The aortic valve is abnormal. There is moderate calcification of the  aortic valve. There is moderate thickening of the aortic valve. Aortic  valve regurgitation is not visualized. Mild aortic valve stenosis.   Patient Profile     80 y.o. male with hx of CAD and DES to LAD and  Multiple LCx system (2014, 2018); mild AS, DM with HTN and HLD who presented 12/27 for angina. 01/22/20 LHC showing new prox LAD and ISR of LCx stents- normal EF-> Recommended for CABG, performed 01/24/20 (LIMA-LAD, SVR to OM).    Assessment & Plan    1.  Obstructive CAD involving the left main, LAD, and circumflex, presenting with unstable angina.  Now status post CABG with LIMA to LAD and SVG to OM on December 30.  LVEF 55 to 60%.  2.  Mild calcific aortic stenosis, asymptomatic.  3.  Essential hypertension, blood pressure currently stable.  4.  Postoperative atrial fibrillation, currently in sinus rhythm.  5.  History of chronic diastolic heart failure.  Approximately 1100 cc out more than in last 24 hours.  6.  Mild thrombocytopenia, platelets 100 at this  point.  Continue aspirin, Lipitor, Coreg, and Lovenox.  Agree with as needed use of Lasix for now.  Watch platelet count.  Consider amiodarone 200 mg twice daily if recurrent atrial fibrillation.  Follow blood pressure trend.  As an outpatient he was on lisinopril, Norvasc, also Imdur.  IV nitroglycerin was stopped in the meanwhile, no active angina.  Signed, Nona Dell, MD  01/26/2020, 7:59 AM

## 2020-01-26 NOTE — Progress Notes (Signed)
Report called to Judithann Sheen. Patient with no cpomplaints except mild nausea, zofran given pripr to transfer. Patient taken up to floor via WC by Phyllis Ginger.

## 2020-01-26 NOTE — Discharge Summary (Addendum)
Physician Discharge Summary       [address]       ,[place] [address]             [phone]    Patient ID: Sean Villarreal MRN: [id number] DOB/AGE: 1940-04-07 80 y.o.  Admit date: 01/20/2020 Discharge date: 01/31/2020  Admission Diagnoses: 1. Unstable angina ([place]) 2. Coronary artery disease Discharge Diagnoses:  1. S/p CABG x 2 2. Expected post op blood loss anemia 3. History of Type II diabetes mellitus ([place]) 4. History of essential hypertension 5. History of dyslipidemia 6. History of Class 1 obesity 7. History of remote tobacco abuse 8. History of GERD (gastroesophageal reflux disease) 9. History of kidney stones  Consults: None  Procedure (s): Median sternotomy, extracorporeal circulation, coronary artery bypass grafting x2 (left internal mammary artery to LAD, saphenous vein graft to OM1), endoscopic vein harvest, right thigh by Dr. Roxan Hockey on 01/24/2020.  History of Presenting Illness: This is a 80 year old male with a past medical history of coronary artery disease (s/p DES to LAD and LCx in 04/2012, PTCA to ostial LCx in 03/2016 due to ISR, DES to ostial LCx in 09/2016 due to ISR, patent stents by repeat cath in 03/2018), hypertension, hyperlipidemia, Type 2 diabetes mellitus, and mild AS who presented to [facility] ED on 01/20/2020 with complaints of chest pressure/pain radiating to  shoulder blades and dizziness. Patient denied nausea, emesis, LE edema, or diaphoresis. Patient states that this has happened mostly with exertion, but has occurred recently while at rest. He also has complaints of shortness of breath at times as well with the aforementioned symptoms. EKG showed normal sinus rhythm, LVH. Initial Troponin high sensitivity was 8. It waspreviouslyrecommend by Dr. Domenic Polite to pursue a repeat cardiac catheterization if the patient had progressive symptoms. Dr. Harrington Challenger reviewed with the patient on 01/21/2020 and he is in agreement to proceed.   Patient was transferred to [facility] and underwent a cardiac catheterization earlier today with Dr. Tamala Julian. Results showed severe, calcific coronary artery disease involving the LAD, Circumflex, and left main (40-50% LM stenosis). Dr. Roxan Hockey has been consulted for the consideration of coronary artery bypass grafting surgery.  Dr. Roxan Hockey discussed the need for coronary artery bypass grafting surgery. Potential risks, benefits, and complications of the surgery were discussed with the patient and he agreed to proceed with surgery. Pre operative carotid duplex US showed no significant internal carotid artery stenosis bilaterally. After P2Y12 was checked and was down to 151 he was able to undergo a CABG x 2 on 01/24/2020.  Brief Hospital Course:   The patient was extubated the evening of surgery.  During his stay in the SICU the patient was weaned off NTG drip for HTN.  He was restarted on his home Coreg dose to assist with drip wean.  His chest tubes and arterial lines were removed without difficulty.  He was maintaining NSR and was transferred to the progressive care unit on 01/26/2019.  The patient developed rapid Atrial Fibrillation with RVR.  He was treated with IV Amiodarone protocol.  He converted to NSR without difficulty.  His potassium and magnesium levels were supplemented.  His pacing wires were removed without difficulty.  His blood sugars were elevated and he was transitioned to his oral medication regimen and sliding scale insulin coverage was continued.  He remains hemodynamically stable in NSR.  His Lisinopril dose was titrated for HTN.  He developed brief episodes of Atrial Fibrillation.  He was started on  Eliquis for this.  He was kept in the hospital for an additional 24 hours.  He rhythm remained stable.   His surgical incisions are healing without evidence of infection.  He is ambulating without difficulty.  He is medically stable for discharge home today.     Latest Vital  Signs: Blood pressure 135/74, pulse 88, temperature 97.8 F (36.6 C), temperature source Oral, resp. rate 20, height 5\' 8"  (1.727 m), weight 92.4 kg, SpO2 97 %.  Physical Exam:  General appearance: alert, cooperative and no distress Heart: regular rate and rhythm and soft systolic murmur Lungs: clear to auscultation bilaterally Abdomen: benign Extremities: no edema Wound: incis healing well Discharge Condition: Stable and discharged to home.  Recent laboratory studies:  Lab Results  Component Value Date   WBC 14.1 (H) 01/27/2020   HGB 10.9 (L) 01/27/2020   HCT 32.7 (L) 01/27/2020   MCV 94.2 01/27/2020   PLT 101 (L) 01/27/2020   Lab Results  Component Value Date   NA 138 01/28/2020   K 3.8 01/28/2020   CL 102 01/28/2020   CO2 24 01/28/2020   CREATININE 0.78 01/28/2020   GLUCOSE 129 (H) 01/28/2020      Diagnostic Studies: DG Chest 2 View  Result Date: 01/28/2020 CLINICAL DATA:  Pleural effusion. EXAM: CHEST - 2 VIEW COMPARISON:  01/26/2020 FINDINGS: 0538 hours. The cardio pericardial silhouette is enlarged. Interval progression of left base atelectasis/infiltrate with small effusion. Right lung clear. Right IJ sheath has been removed in the interval. Telemetry leads overlie the chest. IMPRESSION: Interval progression of left base atelectasis/infiltrate with small effusion. Electronically Signed   By: 03/25/2020 M.D.   On: 01/28/2020 06:21   CARDIAC CATHETERIZATION  Result Date: 01/21/2020  Severe calcific coronary disease involving the left anterior descending, circumflex, and left main.  40 to 50% eccentric distal left main  Bulky eccentric 80% ostial to proximal LAD showing progression compared to 2020.  Moderate mid to distal diffuse LAD disease.  Diffuse ostial to proximal in-stent restenosis in circumflex which has 2 layers of stent.  Moderate mid to distal circumflex 50% narrowing.  Occlusion of ramus intermedius, very small and fills by left to left collaterals.   Dominant right coronary with luminal irregularities but no high-grade obstruction.  Normal anterior wall motion.  Estimated > EF 50%.  Inferior wall not well seen.  LVEDP is normal. RECOMMENDATIONS:  Discontinue Plavix  IV heparin  Recommend two-vessel coronary bypass surgery in the setting of significant proximal LAD disease and in-stent restenosis x3 of the ostial circumflex (2 layers of stent).  2D Doppler echocardiogram to fully assess LV function.  DG Chest Port 1 View  Result Date: 01/26/2020 CLINICAL DATA:  80 year old male postoperative day 2 status post CABG. EXAM: PORTABLE CHEST 1 VIEW COMPARISON:  Portable chest 01/25/2020 and earlier. FINDINGS: Portable AP semi upright view at 0558 hours. Swan-Ganz catheter removed. Right IJ introducer sheath remains. Mediastinal and chest tubes removed. Stable somewhat low lung volumes. No pneumothorax identified. Small left pleural effusion. No pulmonary edema. Stable cardiac size and mediastinal contours. Negative visible bowel gas pattern. IMPRESSION: 1. Right IJ introducer sheath remains, other lines and tubes removed. 2. No pneumothorax identified.  Small left pleural effusion. Electronically Signed   By: 01/27/2020 M.D.   On: 01/26/2020 07:49   DG Chest Port 1 View  Result Date: 01/25/2020 CLINICAL DATA:  80 year old male postoperative day 1 status post CABG. EXAM: PORTABLE CHEST 1 VIEW COMPARISON:  Portable chest 01/24/2020 and earlier.  FINDINGS: Portable AP semi upright view at 0523 hours. Extubated. Enteric tube removed. Right IJ Swan-Ganz catheter remains in place, tip near the right hilum now. Mediastinal and left chest tubes remain in place. Lower lung volumes. No pneumothorax or pulmonary edema. Small left pleural effusion and increased bibasilar atelectasis. Stable cardiac size and mediastinal contours. Negative visible bowel gas pattern. IMPRESSION: 1. Extubated and enteric tube removed. Swan-Ganz catheter tip at the right hilum. Stable chest  and mediastinal tubes. 2. No pneumothorax. Lower lung volumes with increased bibasilar atelectasis and small left pleural effusion. Electronically Signed   By: Genevie Ann M.D.   On: 01/25/2020 07:32   DG Chest Port 1 View  Result Date: 01/24/2020 CLINICAL DATA:  Post CABG. EXAM: PORTABLE CHEST 1 VIEW COMPARISON:  Chest x-ray dated January 20, 2020. FINDINGS: Endotracheal tube in position with the tip 3.5 cm above the level of the carina. Enteric tube in the stomach. Right internal jugular Swan-Ganz catheter with the tip in the main pulmonary outflow tract. Mediastinal and left chest tubes are in good position. Interval CABG. Normal heart size. Low lung volumes with bronchovascular crowding. Left basilar opacity with small left pleural effusion. No pneumothorax. No acute osseous abnormality. IMPRESSION: 1. Appropriately positioned lines and tubes. No pneumothorax. 2. Left basilar atelectasis with small left pleural effusion. Electronically Signed   By: Titus Dubin M.D.   On: 01/24/2020 12:40   DG Chest Port 1 View  Result Date: 01/20/2020 CLINICAL DATA:  Chest pain EXAM: PORTABLE CHEST 1 VIEW COMPARISON:  March 31, 2018 FINDINGS: The heart size and mediastinal contours are within normal limits. Both lungs are clear. The visualized skeletal structures are unremarkable. Aortic calcifications are noted. IMPRESSION: No active disease. Electronically Signed   By: Constance Holster M.D.   On: 01/20/2020 22:13   ECHOCARDIOGRAM COMPLETE  Result Date: 01/21/2020    ECHOCARDIOGRAM REPORT   Patient Name:   CHAO WILTGEN Date of Exam: 01/21/2020 Medical Rec #:  [id number]         Height:       68.0 in Accession #:    [id number]        Weight:       213.8 lb Date of Birth:  02/04/1940          BSA:          2.103 m Patient Age:    88 years          BP:           135/79 mmHg Patient Gender: M                 HR:           90 bpm. Exam Location:  Inpatient Procedure: 2D Echo Indications:    acute diastolic chf   History:        Patient has prior history of Echocardiogram examinations, most                 recent 06/28/2019. CAD, Signs/Symptoms:Chest Pain; Risk                 Factors:Hypertension, Dyslipidemia and Diabetes.  Sonographer:    Johny Chess Referring Phys: Anthony  1. Left ventricular ejection fraction, by estimation, is 55 to 60%. The left ventricle has normal function. The left ventricle has no regional wall motion abnormalities. There is mild concentric left ventricular hypertrophy. Left ventricular diastolic parameters are consistent with Grade I diastolic dysfunction (impaired  relaxation).  2. Right ventricular systolic function is normal. The right ventricular size is normal.  3. Left atrial size was mildly dilated.  4. The mitral valve is abnormal. Trivial mitral valve regurgitation. Moderate mitral annular calcification.  5. The aortic valve is abnormal. There is moderate calcification of the aortic valve. There is moderate thickening of the aortic valve. Aortic valve regurgitation is not visualized. Mild aortic valve stenosis. Comparison(s): A prior study was performed on 06/28/2019. No significant change from prior study. FINDINGS  Left Ventricle: Left ventricular ejection fraction, by estimation, is 55 to 60%. The left ventricle has normal function. The left ventricle has no regional wall motion abnormalities. The left ventricular internal cavity size was normal in size. There is  mild concentric left ventricular hypertrophy. Left ventricular diastolic parameters are consistent with Grade I diastolic dysfunction (impaired relaxation). Right Ventricle: The right ventricular size is normal. No increase in right ventricular wall thickness. Right ventricular systolic function is normal. Left Atrium: Left atrial size was mildly dilated. Right Atrium: Right atrial size was normal in size. Pericardium: There is no evidence of pericardial effusion. Mitral Valve: The mitral valve is  abnormal. There is moderate thickening of the mitral valve leaflet(s). There is mild calcification of the mitral valve leaflet(s). Moderate mitral annular calcification. Trivial mitral valve regurgitation. Tricuspid Valve: The tricuspid valve is grossly normal. Tricuspid valve regurgitation is trivial. Aortic Valve: The aortic valve is abnormal. There is moderate calcification of the aortic valve. There is moderate thickening of the aortic valve. Aortic valve regurgitation is not visualized. Mild aortic stenosis is present. Aortic valve mean gradient measures 13.0 mmHg. Aortic valve peak gradient measures 26.8 mmHg. Aortic valve area, by VTI measures 1.35 cm. Pulmonic Valve: The pulmonic valve was not well visualized. Pulmonic valve regurgitation is not visualized. Aorta: The aortic root is normal in size and structure. IAS/Shunts: The interatrial septum was not well visualized.  LEFT VENTRICLE PLAX 2D LVIDd:         4.50 cm  Diastology LVIDs:         3.50 cm  LV e' medial:    7.62 cm/s LV PW:         1.20 cm  LV E/e' medial:  14.3 LV IVS:        1.00 cm  LV e' lateral:   9.36 cm/s LVOT diam:     1.80 cm  LV E/e' lateral: 11.6 LV SV:         65 LV SV Index:   31 LVOT Area:     2.54 cm  RIGHT VENTRICLE             IVC RV S prime:     21.50 cm/s  IVC diam: 2.10 cm TAPSE (M-mode): 3.1 cm LEFT ATRIUM             Index       RIGHT ATRIUM           Index LA diam:        4.00 cm 1.90 cm/m  RA Area:     18.30 cm LA Vol (A2C):   71.9 ml 34.19 ml/m RA Volume:   51.40 ml  24.44 ml/m LA Vol (A4C):   63.9 ml 30.39 ml/m LA Biplane Vol: 69.4 ml 33.00 ml/m  AORTIC VALVE AV Area (Vmax):    1.12 cm AV Area (Vmean):   1.10 cm AV Area (VTI):     1.35 cm AV Vmax:  259.00 cm/s AV Vmean:          167.000 cm/s AV VTI:            0.481 m AV Peak Grad:      26.8 mmHg AV Mean Grad:      13.0 mmHg LVOT Vmax:         114.50 cm/s LVOT Vmean:        72.400 cm/s LVOT VTI:          0.255 m LVOT/AV VTI ratio: 0.53  AORTA Ao Root  diam: 3.00 cm MITRAL VALVE MV Area (PHT): 3.39 cm     SHUNTS MV Decel Time: 224 msec     Systemic VTI:  0.26 m MV E velocity: 109.00 cm/s  Systemic Diam: 1.80 cm MV A velocity: 132.00 cm/s MV E/A ratio:  0.83 Rudean Haskell MD Electronically signed by Rudean Haskell MD Signature Date/Time: 01/21/2020/6:17:01 PM    Final    ECHO INTRAOPERATIVE TEE  Result Date: 01/24/2020  *INTRAOPERATIVE TRANSESOPHAGEAL REPORT *  Patient Name:   HUNTLEY NARAIN Date of Exam: 01/24/2020 Medical Rec #:  [id number]         Height:       68.0 in Accession #:    [id number]        Weight:       204.8 lb Date of Birth:  [birth year]          BSA:          2.06 m Patient Age:    41 years          BP:           138/79 mmHg Patient Gender: M                 HR:           71 bpm. Exam Location:  Inpatient Transesophogeal exam was perform intraoperatively during surgical procedure. Patient was closely monitored under general anesthesia during the entirety of examination. Indications:     CABG Performing Phys: [place] Diagnosing Phys: Nolon Nations MD Complications: No known complications during this procedure. POST-OP IMPRESSIONS - Left Ventricle: The left ventricle is unchanged from pre-bypass. - Right Ventricle: The right ventricle appears unchanged from pre-bypass. - Aorta: The aorta appears unchanged from pre-bypass. - Left Atrium: The left atrium appears unchanged from pre-bypass. - Left Atrial Appendage: The left atrial appendage appears unchanged from pre-bypass. - Aortic Valve: The aortic valve appears unchanged from pre-bypass. - Mitral Valve: The mitral valve appears unchanged from pre-bypass. - Tricuspid Valve: The tricuspid valve appears unchanged from pre-bypass. - Interatrial Septum: The interatrial septum appears unchanged from pre-bypass. - Interventricular Septum: The interventricular septum appears unchanged from pre-bypass. - Pericardium: The pericardium appears unchanged from pre-bypass.  PRE-OP FINDINGS  Left Ventricle: The left ventricle has normal systolic function, with an ejection fraction of 55-60%. The cavity size was normal. There is moderate concentric left ventricular hypertrophy. Right Ventricle: The right ventricle has normal systolic function. The cavity was mildly enlarged. There is no increase in right ventricular wall thickness. Left Atrium: Left atrial size was normal in size. The left atrial appendage is well visualized and there is no evidence of thrombus present. Right Atrium: Right atrial size was normal in size. Interatrial Septum: No atrial level shunt detected by color flow Doppler. Pericardium: There is no evidence of pericardial effusion. Mitral Valve: The mitral valve is normal in structure. Mild thickening of the mitral valve leaflet. Mild  calcification of the mitral valve leaflet. Mitral valve regurgitation is mild by color flow Doppler. There is no evidence of mitral valve vegetation. Tricuspid Valve: The tricuspid valve was normal in structure. Tricuspid valve regurgitation is mild by color flow Doppler. There is no evidence of tricuspid valve vegetation. Aortic Valve: The aortic valve is tricuspid Aortic valve regurgitation is mild by color flow Doppler. There is mild stenosis of the aortic valve. There is no evidence of a vegetation on the aortic valve. Pulmonic Valve: The pulmonic valve was normal in structure. Pulmonic valve regurgitation is mild by color flow Doppler.  Nolon Nations MD Electronically signed by Nolon Nations MD Signature Date/Time: 01/24/2020/6:51:55 PM    Final    VAS US DOPPLER PRE CABG  Result Date: 01/22/2020 PREOPERATIVE VASCULAR EVALUATION  Indications:  Pre-CABG. Risk Factors: Hypertension, Diabetes, coronary artery disease. Performing Technologist: Vonzell Schlatter RVT  Examination Guidelines: A complete evaluation includes B-mode imaging, spectral Doppler, color Doppler, and power Doppler as needed of all accessible portions of each  vessel. Bilateral testing is considered an integral part of a complete examination. Limited examinations for reoccurring indications may be performed as noted.  Right Carotid Findings: +----------+--------+--------+--------+------------+--------+           PSV cm/sEDV cm/sStenosisDescribe    Comments +----------+--------+--------+--------+------------+--------+ CCA Prox  81      13                                   +----------+--------+--------+--------+------------+--------+ CCA Distal91      16                                   +----------+--------+--------+--------+------------+--------+ ICA Prox  58      13      1-39%   heterogenous         +----------+--------+--------+--------+------------+--------+ ICA Distal71      19                                   +----------+--------+--------+--------+------------+--------+ ECA       132                                          +----------+--------+--------+--------+------------+--------+ Portions of this table do not appear on this page. +----------+--------+-------+--------+------------+           PSV cm/sEDV cmsDescribeArm Pressure +----------+--------+-------+--------+------------+ Subclavian78                                  +----------+--------+-------+--------+------------+ +---------+--------+--+--------+--+ VertebralPSV cm/s35EDV cm/s10 +---------+--------+--+--------+--+ Left Carotid Findings: +----------+--------+--------+--------+------------+--------+           PSV cm/sEDV cm/sStenosisDescribe    Comments +----------+--------+--------+--------+------------+--------+ CCA Prox  86      11                                   +----------+--------+--------+--------+------------+--------+ CCA Distal87      16                                   +----------+--------+--------+--------+------------+--------+  ICA Prox  69      18      1-39%   heterogenous          +----------+--------+--------+--------+------------+--------+ ICA Distal60      18                                   +----------+--------+--------+--------+------------+--------+ ECA       107                                          +----------+--------+--------+--------+------------+--------+ +----------+--------+--------+--------+------------+ SubclavianPSV cm/sEDV cm/sDescribeArm Pressure +----------+--------+--------+--------+------------+           99                                   +----------+--------+--------+--------+------------+ +---------+--------+--+--------+--+ VertebralPSV cm/s60EDV cm/s13 +---------+--------+--+--------+--+  ABI Findings: +---------+------------------+-----+---------+--------+ Right    Rt Pressure (mmHg)IndexWaveform Comment  +---------+------------------+-----+---------+--------+ Brachial 125                    triphasic         +---------+------------------+-----+---------+--------+ PTA      199               1.59 triphasic         +---------+------------------+-----+---------+--------+ DP       188               1.50 biphasic          +---------+------------------+-----+---------+--------+ Great Toe120               0.96 Normal            +---------+------------------+-----+---------+--------+ +---------+------------------+-----+---------+-------+ Left     Lt Pressure (mmHg)IndexWaveform Comment +---------+------------------+-----+---------+-------+ Brachial 119                    triphasic        +---------+------------------+-----+---------+-------+ PTA      199               1.59 triphasic        +---------+------------------+-----+---------+-------+ DP       183               1.46 triphasic        +---------+------------------+-----+---------+-------+ Georgeanna Harrison               1.10 Normal           +---------+------------------+-----+---------+-------+ Arterial wall calcification  precludes accurate ankle pressures and ABIs.  Right Doppler Findings: +-----------+--------+-----+---------+--------+ Site       PressureIndexDoppler  Comments +-----------+--------+-----+---------+--------+ Brachial   125          triphasic         +-----------+--------+-----+---------+--------+ Radial                  triphasic         +-----------+--------+-----+---------+--------+ Ulnar                   triphasic         +-----------+--------+-----+---------+--------+ Palmar Arch             triphasic         +-----------+--------+-----+---------+--------+  Left Doppler Findings: +-----------+--------+-----+---------+--------+ Site       PressureIndexDoppler  Comments +-----------+--------+-----+---------+--------+ Brachial  119          triphasic         +-----------+--------+-----+---------+--------+ Radial                  triphasic         +-----------+--------+-----+---------+--------+ Ulnar                   triphasic         +-----------+--------+-----+---------+--------+ Palmar Arch             triphasic         +-----------+--------+-----+---------+--------+  Summary: Right Carotid: Velocities in the right ICA are consistent with a 1-39% stenosis. Left Carotid: Velocities in the left ICA are consistent with a 1-39% stenosis. Vertebrals:  Bilateral vertebral arteries demonstrate antegrade flow. Subclavians: Normal flow hemodynamics were seen in bilateral subclavian              arteries. Right ABI: The right toe-brachial index is normal. ABIs are unreliable. RT great toe pressure = 120 mmHg. Left ABI: The left toe-brachial index is normal. ABIs are unreliable. LT Great toe pressure = 138 mmHg. Right Upper Extremity: Normal PPG waveforms with radial artery compression suggest palmar arch patency. Doppler waveforms remain within normal limits with right radial compression. Doppler waveforms remain within normal limits with right ulnar  compression. Left Upper Extremity: Normal PPG waveforms with radial artery compression suggest palmar arch patency. Doppler waveforms remain within normal limits with left radial compression. Doppler waveforms remain within normal limits with left ulnar compression.  Electronically signed by Servando Snare MD on 01/22/2020 at 2:19:35 PM.    Final        Discharge Instructions    Amb Referral to Cardiac Rehabilitation   Complete by: As directed    Referring to [facility] CRP 2   Diagnosis: CABG   CABG X ___: 2   After initial evaluation and assessments completed: Virtual Based Care may be provided alone or in conjunction with Phase 2 Cardiac Rehab based on patient barriers.: Yes   Discharge patient   Complete by: As directed    Discharge disposition: 01-Home or Self Care   Discharge patient date: 01/31/2020      Discharge Medications: Allergies as of 01/31/2020      Reactions   Contrast Media [iodinated Diagnostic Agents] Hives, Other (See Comments)   Had "welts" on arm, and kidney issues after given dye in the past   Penicillins Other (See Comments)   Passed out as a child Has patient had a PCN reaction causing immediate rash, facial/tongue/throat swelling, SOB or lightheadedness with hypotension: Unk Has patient had a PCN reaction causing severe rash involving mucus membranes or skin necrosis: Unk Has patient had a PCN reaction that required hospitalization: Unk Has patient had a PCN reaction occurring within the last 10 years: No If all of the above answers are "NO", then may proceed with Cephalosporin use.      Medication List    STOP taking these medications   amLODipine 10 MG tablet Commonly known as: NORVASC   clopidogrel 75 MG tablet Commonly known as: PLAVIX   furosemide 20 MG tablet Commonly known as: LASIX   isosorbide mononitrate 30 MG 24 hr tablet Commonly known as: Imdur   potassium chloride SA 20 MEQ tablet Commonly known as: KLOR-CON     TAKE these  medications   acetaminophen 500 MG tablet Commonly known as: TYLENOL Take 2 tablets (1,000 mg total) by mouth every 6 (six) hours  as needed.   amiodarone 200 MG tablet Commonly known as: PACERONE Take 2 tablets (400 mg total) by mouth 2 (two) times daily. X 7 days, then decrease to 200 mg BID x 7 days, then decrease to 200 mg daily   apixaban 5 MG Tabs tablet Commonly known as: ELIQUIS Take 1 tablet (5 mg total) by mouth 2 (two) times daily.   aspirin 81 MG tablet Take 81 mg by mouth daily.   atorvastatin 80 MG tablet Commonly known as: LIPITOR Take 80 mg by mouth at bedtime.   carvedilol 25 MG tablet Commonly known as: COREG Take 1 tablet (25 mg total) by mouth 2 (two) times daily with a meal.   glimepiride 1 MG tablet Commonly known as: AMARYL Take 1 mg by mouth daily with breakfast.   ketoconazole 2 % cream Commonly known as: NIZORAL Apply 1 application topically 2 (two) times daily as needed (apply to face as needed (uses during the winter)). For dry skin   lisinopril 40 MG tablet Commonly known as: ZESTRIL Take 1 tablet (40 mg total) by mouth daily.   metFORMIN 1000 MG tablet Commonly known as: GLUCOPHAGE Take 1 tablet (1,000 mg total) by mouth 2 (two) times daily.   nitroGLYCERIN 0.4 MG SL tablet Commonly known as: NITROSTAT DISSOLVE 1 TABLET UNDER THE TONGUE EVERY 5 MINUTES AS  NEEDED FOR CHEST PAIN. MAX  OF 3 TABLETS IN 15 MINUTES. CALL 911 IF PAIN PERSISTS. What changed: See the new instructions.   psyllium 28 % packet Commonly known as: Metamucil Smooth Texture Take 1 packet by mouth at bedtime.   tamsulosin 0.4 MG Caps capsule Commonly known as: FLOMAX Take 0.4 mg by mouth 2 (two) times daily.   traMADol 50 MG tablet Commonly known as: ULTRAM Take 1-2 tablets (50-100 mg total) by mouth every 4 (four) hours as needed for moderate pain.      The patient has been discharged on:   1.Beta Blocker:  Yes [ y  ]                              No   [    ]                              If No, reason:  2.Ace Inhibitor/ARB: Yes [ y  ]                                     No  [    ]                                     If No, reason:  3.Statin:   Yes [  y ]                  No  [   ]                  If No, reason:  4.Ecasa:  Yes  [ y  ]                  No   [   ]  If No, reason:  Follow Up Appointments:  Follow-up Information    Melrose Nakayama, MD. Go on 02/26/2020.   Specialty: Cardiothoracic Surgery Why: PA/LAT CXR to be taken (at [facility] which is in the same building as Dr. Leonarda Salon office) on 02/01 at 10:00 am;Appointment time is at 10:30 am Contact information: [place] [address] [phone]        Verta Ellen., NP. Go on 02/08/2020.   Specialty: Cardiology Why: Appointment time is at 2:30 pm Contact information: [place] [address] [phone]               Signed: Gaspar Bidding 01/31/2020, 7:24 AM

## 2020-01-27 DIAGNOSIS — I200 Unstable angina: Secondary | ICD-10-CM | POA: Diagnosis not present

## 2020-01-27 LAB — BASIC METABOLIC PANEL
Anion gap: 10 (ref 5–15)
BUN: 10 mg/dL (ref 8–23)
CO2: 23 mmol/L (ref 22–32)
Calcium: 8.4 mg/dL — ABNORMAL LOW (ref 8.9–10.3)
Chloride: 102 mmol/L (ref 98–111)
Creatinine, Ser: 0.73 mg/dL (ref 0.61–1.24)
GFR, Estimated: >60 mL/min (ref >60)
Glucose, Bld: 126 mg/dL — ABNORMAL HIGH (ref 70–99)
Potassium: 3.9 mmol/L (ref 3.5–5.1)
Sodium: 135 mmol/L (ref 135–145)

## 2020-01-27 LAB — CBC
HCT: 32.7 % — ABNORMAL LOW (ref 39.0–52.0)
Hemoglobin: 10.9 g/dL — ABNORMAL LOW (ref 13.0–17.0)
MCH: 31.4 pg (ref 26.0–34.0)
MCHC: 33.3 g/dL (ref 30.0–36.0)
MCV: 94.2 fL (ref 80.0–100.0)
Platelets: 101 10*3/uL — ABNORMAL LOW (ref 150–400)
RBC: 3.47 MIL/uL — ABNORMAL LOW (ref 4.22–5.81)
RDW: 12.6 % (ref 11.5–15.5)
WBC: 14.1 10*3/uL — ABNORMAL HIGH (ref 4.0–10.5)
nRBC: 0 % (ref 0.0–0.2)

## 2020-01-27 LAB — GLUCOSE, CAPILLARY
Glucose-Capillary: 97 mg/dL (ref 70–99)
Glucose-Capillary: 197 mg/dL — ABNORMAL HIGH (ref 70–99)
Glucose-Capillary: 273 mg/dL — ABNORMAL HIGH (ref 70–99)
Glucose-Capillary: 255 mg/dL — ABNORMAL HIGH (ref 70–99)

## 2020-01-27 MED ORDER — AMIODARONE HCL IN DEXTROSE 360-4.14 MG/200ML-% IV SOLN
30.0000 mg/h | INTRAVENOUS | Status: DC
  Administered 2020-01-27 – 2020-01-28 (×2): 30 mg/h via INTRAVENOUS
  Filled 2020-01-27 (×2): qty 200

## 2020-01-27 MED ORDER — DIPHENHYDRAMINE HCL 25 MG PO CAPS: 25.0000 mg | ORAL_CAPSULE | Freq: Four times a day (QID) | ORAL | Status: DC | PRN

## 2020-01-27 MED ORDER — INSULIN DETEMIR 100 UNIT/ML ~~LOC~~ SOLN
18.0000 [IU] | Freq: Two times a day (BID) | SUBCUTANEOUS | Status: DC
  Administered 2020-01-27 (×2): 18 [IU] via SUBCUTANEOUS
  Filled 2020-01-27 (×4): qty 0.18

## 2020-01-27 MED ORDER — AMIODARONE HCL IN DEXTROSE 360-4.14 MG/200ML-% IV SOLN
60.0000 mg/h | INTRAVENOUS | Status: DC
  Administered 2020-01-27: 60 mg/h via INTRAVENOUS
  Filled 2020-01-27: qty 200

## 2020-01-27 MED ORDER — POTASSIUM CHLORIDE CRYS ER 20 MEQ PO TBCR
20.0000 meq | EXTENDED_RELEASE_TABLET | Freq: Once | ORAL | Status: AC
  Administered 2020-01-27: 20 meq via ORAL
  Filled 2020-01-27: qty 1

## 2020-01-27 MED ORDER — LACTULOSE 10 GM/15ML PO SOLN
20.0000 g | Freq: Once | ORAL | Status: DC
  Filled 2020-01-27: qty 30

## 2020-01-27 MED ORDER — LISINOPRIL 2.5 MG PO TABS
2.5000 mg | ORAL_TABLET | Freq: Every day | ORAL | Status: DC
  Administered 2020-01-27 – 2020-01-28 (×2): 2.5 mg via ORAL
  Filled 2020-01-27 (×2): qty 1

## 2020-01-27 NOTE — Progress Notes (Signed)
Mobility Specialist: Progress Note   01/27/20 1144  Mobility  Activity Ambulated in hall  Level of Assistance Modified independent, requires aide device or extra time  Assistive Device Front wheel walker  Distance Ambulated (ft) 430 ft  Mobility Response Tolerated well  Mobility performed by Mobility specialist  $Mobility charge 1 Mobility   Pre-Mobility: 91 HR, 123/65 BP, 97% SpO2 During Mobility: 140 HR Post-Mobility: 124 HR, 126/72 BP, 96% SpO2  Pt asx during ambulation. Pt's HR peaked at 140 bpm during ambulation. RN met [place] in the hallway to check on pt. Pt converted to afib, RN present. Pt back to bed per RN request.   Harrell Gave Audreana Hancox Mobility Specialist

## 2020-01-27 NOTE — Progress Notes (Signed)
Progress Note  Patient Name: Sean Villarreal Date of Encounter: 01/27/2020  Primary Cardiologist: Rozann Lesches, MD  Subjective   States that he slept well.  Had a bath and has ambulated already this morning.  No angina symptoms or palpitations.  Inpatient Medications    Scheduled Meds: . acetaminophen  1,000 mg Oral Q6H   Or  . acetaminophen (TYLENOL) oral liquid 160 mg/5 mL  1,000 mg Per Tube Q6H  . aspirin EC  325 mg Oral Daily   Or  . aspirin  324 mg Per Tube Daily  . atorvastatin  80 mg Oral QHS  . bisacodyl  10 mg Oral Daily   Or  . bisacodyl  10 mg Rectal Daily  . carvedilol  25 mg Oral BID WC  . Chlorhexidine Gluconate Cloth  6 each Topical Daily  . docusate sodium  200 mg Oral Daily  . enoxaparin (LOVENOX) injection  40 mg Subcutaneous QHS  . furosemide  40 mg Oral Daily  . insulin aspart  0-24 Units Subcutaneous TID WC & HS  . insulin detemir  15 Units Subcutaneous BID  . mouth rinse  15 mL Mouth Rinse BID  . pantoprazole  40 mg Oral Daily  . potassium chloride  40 mEq Oral Daily  . sodium chloride flush  3 mL Intravenous Q12H  . sodium chloride flush  3 mL Intravenous Q12H  . sodium chloride flush  3 mL Intravenous Q12H  . tamsulosin  0.4 mg Oral BID   Continuous Infusions: . sodium chloride    . sodium chloride    . lactated ringers    . lactated ringers    . lactated ringers 20 mL/hr at 01/26/20 0600   PRN Meds: sodium chloride, dextrose, lactated ringers, metoprolol tartrate, midazolam, morphine injection, ondansetron (ZOFRAN) IV, oxyCODONE, sodium chloride flush, sodium chloride flush, traMADol   Vital Signs    Vitals:   01/26/20 1627 01/26/20 1928 01/26/20 2323 01/27/20 0359  BP: 118/66 108/61 123/61 134/68  Pulse: 91 88 93 95  Resp: 19 19 18 18   Temp: 98.6 F (37 C) 98.7 F (37.1 C) 97.8 F (36.6 C) 98.2 F (36.8 C)  TempSrc: Oral Oral Oral Oral  SpO2: 96% 96% 97% 95%  Weight:    97.3 kg  Height:        Intake/Output Summary  (Last 24 hours) at 01/27/2020 0814 Last data filed at 01/27/2020 0400 Gross per 24 hour  Intake 680 ml  Output 1000 ml  Net -320 ml   Filed Weights   01/25/20 0500 01/26/20 0500 01/27/20 0359  Weight: 98.2 kg 97.8 kg 97.3 kg    Telemetry    Sinus rhythm.  Personally reviewed.  ECG    An ECG dated 01/25/2020 was personally reviewed today and demonstrated:  Sinus rhythm with right bundle branch block.  Physical Exam   GEN:  Elderly male.  No acute distress.   Neck: No JVD. Cardiac:  RRR without gallop.  Thorax: Sternal dressing intact. Respiratory: Nonlabored.  Clear to auscultation bilaterally. MS:  No edema; No deformity.  Labs    Chemistry Recent Labs  Lab 01/20/20 2045 01/22/20 0637 01/25/20 1630 01/26/20 0519 01/27/20 0213  NA 141   < > 133* 132* 135  K 3.5   < > 3.9 3.5 3.9  CL 101   < > 100 100 102  CO2 27   < > 23 25 23   GLUCOSE 161*   < > 202* 185* 126*  BUN 13   < >  9 6* 10  CREATININE 0.86   < > 0.68 0.64 0.73  CALCIUM 9.0   < > 7.9* 7.7* 8.4*  PROT 7.6  --   --   --   --   ALBUMIN 4.3  --   --   --   --   AST 20  --   --   --   --   ALT 23  --   --   --   --   ALKPHOS 47  --   --   --   --   BILITOT 0.7  --   --   --   --   GFRNONAA >60   < > >60 >60 >60  ANIONGAP 13   < > 10 7 10    < > = values in this interval not displayed.     Hematology Recent Labs  Lab 01/25/20 1630 01/26/20 0519 01/27/20 0213  WBC 15.3* 14.2* 14.1*  RBC 3.71* 3.39* 3.47*  HGB 12.2* 10.7* 10.9*  HCT 34.3* 32.1* 32.7*  MCV 92.5 94.7 94.2  MCH 32.9 31.6 31.4  MCHC 35.6 33.3 33.3  RDW 12.5 12.5 12.6  PLT 124* 100* 101*    Cardiac Enzymes Recent Labs  Lab 01/20/20 2045 01/20/20 2245  TROPONINIHS 8 10    Radiology    DG Chest Port 1 View  Result Date: 01/26/2020 CLINICAL DATA:  80 year old male postoperative day 2 status post CABG. EXAM: PORTABLE CHEST 1 VIEW COMPARISON:  Portable chest 01/25/2020 and earlier. FINDINGS: Portable AP semi upright view at 0558  hours. Swan-Ganz catheter removed. Right IJ introducer sheath remains. Mediastinal and chest tubes removed. Stable somewhat low lung volumes. No pneumothorax identified. Small left pleural effusion. No pulmonary edema. Stable cardiac size and mediastinal contours. Negative visible bowel gas pattern. IMPRESSION: 1. Right IJ introducer sheath remains, other lines and tubes removed. 2. No pneumothorax identified.  Small left pleural effusion. Electronically Signed   By: Odessa Fleming M.D.   On: 01/26/2020 07:49    Cardiac Studies   Echocardiogram 01/21/2020: 1. Left ventricular ejection fraction, by estimation, is 55 to 60%. The  left ventricle has normal function. The left ventricle has no regional  wall motion abnormalities. There is mild concentric left ventricular  hypertrophy. Left ventricular diastolic  parameters are consistent with Grade I diastolic dysfunction (impaired  relaxation).  2. Right ventricular systolic function is normal. The right ventricular  size is normal.  3. Left atrial size was mildly dilated.  4. The mitral valve is abnormal. Trivial mitral valve regurgitation.  Moderate mitral annular calcification.  5. The aortic valve is abnormal. There is moderate calcification of the  aortic valve. There is moderate thickening of the aortic valve. Aortic  valve regurgitation is not visualized. Mild aortic valve stenosis.   Patient Profile     80 y.o. male with hx of CAD and DES to LAD and  Multiple LCx system (2014, 2018); mild AS, DM with HTN and HLD who presented 12/27 for angina. 01/22/20 LHC showing new prox LAD and ISR of LCx stents- normal EF-> Recommended for CABG, performed 01/24/20 (LIMA-LAD, SVR to OM).    Assessment & Plan    1.  Obstructive CAD involving the left main, LAD, and circumflex, presenting with unstable angina.  Now status post CABG with LIMA to LAD and SVG to OM on December 30.  LVEF 55 to 60%.  Remains hemodynamically stable and tolerating increasing  activity.  No active angina.  2.  Mild  calcific aortic stenosis, asymptomatic.  3.  Essential hypertension, blood pressure currently stable with systolics running 110-130.  4.  Postoperative atrial fibrillation, currently in sinus rhythm.  5.  History of chronic diastolic heart failure.  Started on oral Lasix with potassium supplement.  Approximately 300 cc out more than in last 24 hours.  6.  Mild thrombocytopenia, platelets stable at 101.  Continue aspirin, Coreg, Lipitor, and oral Lasix with potassium supplement.  Rhythm has been stable, continue to follow.  Increase activity as tolerated with cardiac rehabilitation.  Signed, Nona Dell, MD  01/27/2020, 8:14 AM

## 2020-01-27 NOTE — Progress Notes (Signed)
  Amiodarone Drug - Drug Interaction Consult Note  Recommendations: Monitor myopathy on atorvastatin, bradycardia on carvedilol, and hypokalemia on Lasix.   Amiodarone is metabolized by the cytochrome P450 system and therefore has the potential to cause many drug interactions. Amiodarone has an average plasma half-life of 50 days (range 20 to 100 days).   There is potential for drug interactions to occur several weeks or months after stopping treatment and the onset of drug interactions may be slow after initiating amiodarone.   []  Statins: Increased risk of myopathy. Simvastatin- restrict dose to 20mg  daily. Other statins: counsel patients to report any muscle pain or weakness immediately.  []  Anticoagulants: Amiodarone can increase anticoagulant effect. Consider warfarin dose reduction. Patients should be monitored closely and the dose of anticoagulant altered accordingly, remembering that amiodarone levels take several weeks to stabilize.  []  Antiepileptics: Amiodarone can increase plasma concentration of phenytoin, the dose should be reduced. Note that small changes in phenytoin dose can result in large changes in levels. Monitor patient and counsel on signs of toxicity.  [x]  Beta blockers: increased risk of bradycardia, AV block and myocardial depression. Sotalol - avoid concomitant use.  []   Calcium channel blockers (diltiazem and verapamil): increased risk of bradycardia, AV block and myocardial depression.  []   Cyclosporine: Amiodarone increases levels of cyclosporine. Reduced dose of cyclosporine is recommended.  []  Digoxin dose should be halved when amiodarone is started.  [x]  Diuretics: increased risk of cardiotoxicity if hypokalemia occurs.  []  Oral hypoglycemic agents (glyburide, glipizide, glimepiride): increased risk of hypoglycemia. Patient's glucose levels should be monitored closely when initiating amiodarone therapy.   []  Drugs that prolong the QT interval:  Torsades de  pointes risk may be increased with concurrent use - avoid if possible.  Monitor QTc, also keep magnesium/potassium WNL if concurrent therapy can't be avoided. Antibiotics: e.g. fluoroquinolones, erythromycin. . Antiarrhythmics: e.g. quinidine, procainamide, disopyramide, sotalol. . Antipsychotics: e.g. phenothiazines, haloperidol.  . Lithium, tricyclic antidepressants, and methadone.  Thank You,   , PharmD PGY1 Acute Care Pharmacy Resident Phone: [phone] 01/27/2020 12:36 PM  Please check [email or web address] for unit specific pharmacy phone numbers.

## 2020-01-27 NOTE — Progress Notes (Signed)
While mobilizing in hall Pt had a couple short bursts of tachycardia getting as high as HR 140.  Pt is now presenting on telemetry as Atrial fibrillation with a steady HR in 90's to low 110's.  Pt is asymptomatic and states that he doesn't feel any different.  Joycelyn Man PA notified.  New orders to start Amio drip.

## 2020-01-27 NOTE — Progress Notes (Signed)
Nurse notified me patient went into a fib with RVR. Will put on Amiodarone drip and pull wires in am.

## 2020-01-27 NOTE — Progress Notes (Addendum)
      [address]       [place] [address]             [phone]        3 Days Post-Op Procedure(s) (LRB): CORONARY ARTERY BYPASS GRAFTING (CABG), ON PUMP, TIMES TWO, USING LEFT INTERNAL MAMMARY ARTERY AND ENDOSCOPICALLY HARVESTED RIGHT GREATER SAPHENOUS VEIN (N/A) TRANSESOPHAGEAL ECHOCARDIOGRAM (TEE) (N/A)  Subjective: Patient has not had a bowel movement yet.  Objective: Vital signs in last 24 hours: Temp:  [97.8 F (36.6 C)-98.9 F (37.2 C)] 98.2 F (36.8 C) (01/02 0359) Pulse Rate:  [83-100] 95 (01/02 0359) Cardiac Rhythm: Normal sinus rhythm;Bundle branch block (01/01 1900) Resp:  [17-23] 18 (01/02 0359) BP: (100-134)/(56-68) 134/68 (01/02 0359) SpO2:  [94 %-97 %] 95 % (01/02 0359) Weight:  [97.3 kg] 97.3 kg (01/02 0359)  Pre op weight 92.9 kg Current Weight  01/27/20 97.3 kg      Intake/Output from previous day: 01/01 0701 - 01/02 0700 In: 680 [P.O.:680] Out: 1000 [Urine:1000]   Physical Exam:  Cardiovascular: RRR Pulmonary: Slightly diminished bibasilar breath sounds Abdomen: Soft, non tender, bowel sounds present. Extremities: Mild bilateral lower extremity edema. Mild right thigh ecchymosis Wounds: Sternal dressing is clean and dry.  No erythema or signs of infection.  Lab Results: CBC: Recent Labs    01/26/20 0519 01/27/20 0213  WBC 14.2* 14.1*  HGB 10.7* 10.9*  HCT 32.1* 32.7*  PLT 100* 101*   BMET:  Recent Labs    01/26/20 0519 01/27/20 0213  NA 132* 135  K 3.5 3.9  CL 100 102  CO2 25 23  GLUCOSE 185* 126*  BUN 6* 10  CREATININE 0.64 0.73  CALCIUM 7.7* 8.4*    PT/INR:  Lab Results  Component Value Date   INR 1.4 (H) 01/24/2020   INR 1.0 03/31/2018   INR 1.06 10/09/2016   ABG:  INR: Will add last result for INR, ABG once components are confirmed Will add last 4 CBG results once components are confirmed  Assessment/Plan:  1. CV - Maintaining SR. On Carvedilol 25 mg bid. Will restart low dose  Lisinopril. 2.  Pulmonary - On room air. Check PA/LAT CXR in am. Encourage incentive spirometer 3. Volume Overload - On Lasix 40 mg daily 4.  Expected post op acute blood loss anemia - H and H this am stable at 10.9 and 32.7 5. CBGs 213/205/97. On Insulin but will increase for better glucose control. He was on Metformin and Glimepiride pre op, which will restart in am. Pre op HGA1C 7.6. He will need follow up with his medical doctor after discharge. 6. Supplement potassium 7. Remove EPW 8. LOC constipation 9. Mild thrombocytopenia-platelets this am 101,000 9. Likely home 1-2 days  Donielle M ZimmermanPA-C 01/27/2020,7:56 AM   Agree with above Improve BP control Continue ambulation dispo planning  Rossana Molchan O Amyr Sluder

## 2020-01-28 ENCOUNTER — Inpatient Hospital Stay (HOSPITAL_COMMUNITY): Payer: Medicare Other

## 2020-01-28 DIAGNOSIS — Z951 Presence of aortocoronary bypass graft: Secondary | ICD-10-CM

## 2020-01-28 DIAGNOSIS — E785 Hyperlipidemia, unspecified: Secondary | ICD-10-CM | POA: Diagnosis not present

## 2020-01-28 DIAGNOSIS — I259 Chronic ischemic heart disease, unspecified: Secondary | ICD-10-CM | POA: Diagnosis not present

## 2020-01-28 DIAGNOSIS — I480 Paroxysmal atrial fibrillation: Secondary | ICD-10-CM

## 2020-01-28 DIAGNOSIS — I25110 Atherosclerotic heart disease of native coronary artery with unstable angina pectoris: Secondary | ICD-10-CM | POA: Diagnosis not present

## 2020-01-28 LAB — GLUCOSE, CAPILLARY
Glucose-Capillary: 87 mg/dL (ref 70–99)
Glucose-Capillary: 160 mg/dL — ABNORMAL HIGH (ref 70–99)
Glucose-Capillary: 181 mg/dL — ABNORMAL HIGH (ref 70–99)
Glucose-Capillary: 145 mg/dL — ABNORMAL HIGH (ref 70–99)

## 2020-01-28 LAB — BASIC METABOLIC PANEL
Anion gap: 12 (ref 5–15)
BUN: 13 mg/dL (ref 8–23)
CO2: 24 mmol/L (ref 22–32)
Calcium: 8.5 mg/dL — ABNORMAL LOW (ref 8.9–10.3)
Chloride: 102 mmol/L (ref 98–111)
Creatinine, Ser: 0.78 mg/dL (ref 0.61–1.24)
GFR, Estimated: >60 mL/min (ref >60)
Glucose, Bld: 129 mg/dL — ABNORMAL HIGH (ref 70–99)
Potassium: 3.8 mmol/L (ref 3.5–5.1)
Sodium: 138 mmol/L (ref 135–145)

## 2020-01-28 MED ORDER — AMIODARONE HCL 200 MG PO TABS
400.0000 mg | ORAL_TABLET | Freq: Two times a day (BID) | ORAL | Status: DC
  Administered 2020-01-28 – 2020-01-31 (×7): 400 mg via ORAL
  Filled 2020-01-28 (×7): qty 2

## 2020-01-28 MED ORDER — ASPIRIN EC 81 MG PO TBEC
81.0000 mg | DELAYED_RELEASE_TABLET | Freq: Every day | ORAL | Status: DC
  Administered 2020-01-29 – 2020-01-31 (×3): 81 mg via ORAL
  Filled 2020-01-28 (×3): qty 1

## 2020-01-28 MED ORDER — CLOPIDOGREL BISULFATE 75 MG PO TABS
75.0000 mg | ORAL_TABLET | Freq: Every day | ORAL | Status: DC
  Administered 2020-01-29 – 2020-01-30 (×2): 75 mg via ORAL
  Filled 2020-01-28 (×2): qty 1

## 2020-01-28 MED ORDER — METFORMIN HCL 500 MG PO TABS
1000.0000 mg | ORAL_TABLET | Freq: Two times a day (BID) | ORAL | Status: DC
  Administered 2020-01-28 – 2020-01-31 (×7): 1000 mg via ORAL
  Filled 2020-01-28 (×7): qty 2

## 2020-01-28 MED ORDER — MAGNESIUM OXIDE 400 (241.3 MG) MG PO TABS
400.0000 mg | ORAL_TABLET | Freq: Two times a day (BID) | ORAL | Status: AC
  Administered 2020-01-28 – 2020-01-30 (×6): 400 mg via ORAL
  Filled 2020-01-28 (×6): qty 1

## 2020-01-28 MED ORDER — INSULIN ASPART 100 UNIT/ML ~~LOC~~ SOLN
4.0000 [IU] | Freq: Three times a day (TID) | SUBCUTANEOUS | Status: DC
  Administered 2020-01-28 – 2020-01-31 (×8): 4 [IU] via SUBCUTANEOUS

## 2020-01-28 MED ORDER — GLIMEPIRIDE 1 MG PO TABS
1.0000 mg | ORAL_TABLET | Freq: Every day | ORAL | Status: DC
  Administered 2020-01-28 – 2020-01-31 (×4): 1 mg via ORAL
  Filled 2020-01-28 (×4): qty 1

## 2020-01-28 MED FILL — Magnesium Sulfate Inj 50%: INTRAMUSCULAR | Qty: 10 | Status: AC

## 2020-01-28 MED FILL — Mannitol IV Soln 20%: INTRAVENOUS | Qty: 500 | Status: AC

## 2020-01-28 MED FILL — Lidocaine HCl Local Soln Prefilled Syringe 100 MG/5ML (2%): INTRAMUSCULAR | Qty: 5 | Status: AC

## 2020-01-28 MED FILL — Heparin Sodium (Porcine) Inj 1000 Unit/ML: INTRAMUSCULAR | Qty: 30 | Status: AC

## 2020-01-28 MED FILL — Potassium Chloride Inj 2 mEq/ML: INTRAVENOUS | Qty: 40 | Status: AC

## 2020-01-28 MED FILL — Sodium Bicarbonate IV Soln 8.4%: INTRAVENOUS | Qty: 50 | Status: AC

## 2020-01-28 MED FILL — Electrolyte-R (PH 7.4) Solution: INTRAVENOUS | Qty: 4000 | Status: AC

## 2020-01-28 MED FILL — Sodium Chloride IV Soln 0.9%: INTRAVENOUS | Qty: 2000 | Status: AC

## 2020-01-28 NOTE — Progress Notes (Signed)
Mobility Specialist: Progress Note   01/28/20 1145  Mobility  Activity Ambulated in hall  Level of Assistance Contact guard assist, steadying assist  Assistive Device None  Distance Ambulated (ft) 470 ft  Mobility Response Tolerated well  Mobility performed by Mobility specialist  $Mobility charge 1 Mobility   Pre-Mobility: 91 HR, 98% SpO2 During Mobility: 108 HR Post-Mobility: 91 HR, 147/74 BP, 98% SpO2  Pt asx during ambulation. Pt back to bed per request.   Cristal Deer Gloria Ricardo Mobility Specialist

## 2020-01-28 NOTE — Progress Notes (Addendum)
      [address]       [place] [address]             [phone]      4 Days Post-Op Procedure(s) (LRB): CORONARY ARTERY BYPASS GRAFTING (CABG), ON PUMP, TIMES TWO, USING LEFT INTERNAL MAMMARY ARTERY AND ENDOSCOPICALLY HARVESTED RIGHT GREATER SAPHENOUS VEIN (N/A) TRANSESOPHAGEAL ECHOCARDIOGRAM (TEE) (N/A)   Subjective:  No new complaints.  Wants to know when he can go home.  + ambulation  + BM  Objective: Vital signs in last 24 hours: Temp:  [97.1 F (36.2 C)-98.7 F (37.1 C)] 98.7 F (37.1 C) (01/03 0400) Pulse Rate:  [79-103] 79 (01/03 0400) Cardiac Rhythm: Normal sinus rhythm (01/02 1900) Resp:  [16-22] 16 (01/03 0400) BP: (104-135)/(62-77) 104/65 (01/03 0400) SpO2:  [96 %-98 %] 96 % (01/03 0400) Weight:  [96.1 kg] 96.1 kg (01/03 0500)  Intake/Output from previous day: 01/02 0701 - 01/03 0700 In: 720 [P.O.:720] Out: 1070 [Urine:1070]  General appearance: alert, cooperative and no distress Heart: regular rate and rhythm Lungs: clear to auscultation bilaterally Abdomen: soft, non-tender; bowel sounds normal; no masses,  no organomegaly Extremities: edema trace Wound: clean and dry  Lab Results: Recent Labs    01/26/20 0519 01/27/20 0213  WBC 14.2* 14.1*  HGB 10.7* 10.9*  HCT 32.1* 32.7*  PLT 100* 101*   BMET:  Recent Labs    01/27/20 0213 01/28/20 0104  NA 135 138  K 3.9 3.8  CL 102 102  CO2 23 24  GLUCOSE 126* 129*  BUN 10 13  CREATININE 0.73 0.78  CALCIUM 8.4* 8.5*    PT/INR: No results for input(s): LABPROT, INR in the last 72 hours. ABG    Component Value Date/Time   PHART 7.374 01/24/2020 1717   HCO3 24.3 01/24/2020 1717   TCO2 26 01/24/2020 1717   ACIDBASEDEF 1.0 01/24/2020 1717   O2SAT 97.0 01/24/2020 1717   CBG (last 3)  Recent Labs    01/27/20 1620 01/27/20 2114 01/28/20 0637  GLUCAP 273* 255* 87    Assessment/Plan: S/P Procedure(s) (LRB): CORONARY ARTERY BYPASS GRAFTING (CABG), ON PUMP, TIMES TWO,  USING LEFT INTERNAL MAMMARY ARTERY AND ENDOSCOPICALLY HARVESTED RIGHT GREATER SAPHENOUS VEIN (N/A) TRANSESOPHAGEAL ECHOCARDIOGRAM (TEE) (N/A)  1. CV- PAF, currently NSR- will continue Coreg, Zestril, will transition to Amiodarone 400 mg BID 2. Pulm- no acute issues, off oxygen continue IS 3. Renal- creatinine WNL, K is at 3.8 continue Lasix, potassium, supplement Mag 4. DM- sugars are elevated, will restart home medications, continue SSIP 5. Dispo- patient stable, converted to NSR will transition to oral Amiodarone, supplement Mg, convert to home diabetic medications, d/c EPW if remains stable, possibly for d/c home tomorrow   LOS: 7 days    Lowella Dandy, PA-C 01/28/2020  Pt seen and examined, agree with above Restart PO meds  Viviann Spare C. Dorris Fetch, MD [facility] [phone]

## 2020-01-28 NOTE — Progress Notes (Signed)
D/c pacing wire per order. Pacing wire ends intact. Pt tolerated well. VSS.  Will continue to monitor the pt.   Lawson Radar, RN

## 2020-01-28 NOTE — Progress Notes (Signed)
CARDIAC REHAB PHASE I   PRE:  Rate/Rhythm: 122 ST  BP:  Supine:   Sitting:   Standing:    SaO2:   To bathroom quickly  MODE:  Ambulation: 470 ft   POST:  Rate/Rhythm: 126 ST PACs  BP:  Supine:   Sitting: 143/78  Standing:    SaO2: 97%RA [id number] Pt assisted to bathroom quickly and then assisted to walk. Pt walked 470 ft on RA with rolling walker and minimal asst. Encouraged to adhere to sternal precautions. To recliner after walk. Tolerated well. Encouraged two more walks today.    Luetta Nutting, RN BSN  01/28/2020 8:51 AM

## 2020-01-28 NOTE — Care Management Important Message (Signed)
Important Message  Patient Details  Name: Sean Villarreal MRN: [id number] Date of Birth: 1940/02/18   Medicare Important Message Given:  Yes     Renie Ora 01/28/2020, 10:03 AM

## 2020-01-28 NOTE — Progress Notes (Addendum)
Progress Note  Patient Name: Sean Villarreal Date of Encounter: 01/28/2020  [facility] Cardiologist: Nona Dell, MD   Subjective   HR went up with ambulation with cardiac rehab this morning. No chest pain or dyspnea.   Inpatient Medications    Scheduled Meds: . acetaminophen  1,000 mg Oral Q6H   Or  . acetaminophen (TYLENOL) oral liquid 160 mg/5 mL  1,000 mg Per Tube Q6H  . amiodarone  400 mg Oral BID  . aspirin EC  325 mg Oral Daily   Or  . aspirin  324 mg Per Tube Daily  . atorvastatin  80 mg Oral QHS  . bisacodyl  10 mg Oral Daily   Or  . bisacodyl  10 mg Rectal Daily  . carvedilol  25 mg Oral BID WC  . Chlorhexidine Gluconate Cloth  6 each Topical Daily  . [START ON 01/29/2020] clopidogrel  75 mg Oral Daily  . docusate sodium  200 mg Oral Daily  . enoxaparin (LOVENOX) injection  40 mg Subcutaneous QHS  . furosemide  40 mg Oral Daily  . glimepiride  1 mg Oral Q breakfast  . insulin aspart  0-24 Units Subcutaneous TID WC & HS  . insulin aspart  4 Units Subcutaneous TID WC  . lactulose  20 g Oral Once  . lisinopril  2.5 mg Oral Daily  . magnesium oxide  400 mg Oral BID  . mouth rinse  15 mL Mouth Rinse BID  . metFORMIN  1,000 mg Oral BID WC  . pantoprazole  40 mg Oral Daily  . potassium chloride  40 mEq Oral Daily  . sodium chloride flush  3 mL Intravenous Q12H  . tamsulosin  0.4 mg Oral BID   Continuous Infusions: . sodium chloride    . sodium chloride    . lactated ringers    . lactated ringers    . lactated ringers 20 mL/hr at 01/26/20 0600   PRN Meds: sodium chloride, dextrose, diphenhydrAMINE, lactated ringers, metoprolol tartrate, midazolam, morphine injection, ondansetron (ZOFRAN) IV, oxyCODONE, sodium chloride flush, traMADol   Vital Signs    Vitals:   01/27/20 2356 01/28/20 0400 01/28/20 0500 01/28/20 0835  BP: 121/72 104/65  (!) 143/78  Pulse: 90 79    Resp: (!) 22 16  18   Temp: 98.7 F (37.1 C) 98.7 F (37.1 C)  98.4 F (36.9 C)   TempSrc: Oral Oral  Oral  SpO2: 97% 96%  97%  Weight:   96.1 kg   Height:        Intake/Output Summary (Last 24 hours) at 01/28/2020 0857 Last data filed at 01/27/2020 2355 Gross per 24 hour  Intake 480 ml  Output 1070 ml  Net -590 ml   Last 3 Weights 01/28/2020 01/27/2020 01/26/2020  Weight (lbs) 211 lb 14.4 oz 214 lb 9.6 oz 215 lb 9.8 oz  Weight (kg) 96.117 kg 97.342 kg 97.8 kg      Telemetry    Sinus tachycardia at 100-120s - Personally Reviewed  ECG    N/A  Physical Exam   GEN: No acute distress.   Neck: No JVD Cardiac: RRR, no murmurs, rubs, or gallops.  Respiratory: Clear to auscultation bilaterally. GI: Soft, nontender, non-distended  MS: No edema; No deformity. Neuro:  Nonfocal  Psych: Normal affect   Labs    High Sensitivity Troponin:   Recent Labs  Lab 01/20/20 2045 01/20/20 2245  TROPONINIHS 8 10      Chemistry Recent Labs  Lab 01/26/20  [id number] 01/27/20 0213 01/28/20 0104  NA 132* 135 138  K 3.5 3.9 3.8  CL 100 102 102  CO2 25 23 24   GLUCOSE 185* 126* 129*  BUN 6* 10 13  CREATININE 0.64 0.73 0.78  CALCIUM 7.7* 8.4* 8.5*  GFRNONAA >60 >60 >60  ANIONGAP 7 10 12      Hematology Recent Labs  Lab 01/25/20 1630 01/26/20 0519 01/27/20 0213  WBC 15.3* 14.2* 14.1*  RBC 3.71* 3.39* 3.47*  HGB 12.2* 10.7* 10.9*  HCT 34.3* 32.1* 32.7*  MCV 92.5 94.7 94.2  MCH 32.9 31.6 31.4  MCHC 35.6 33.3 33.3  RDW 12.5 12.5 12.6  PLT 124* 100* 101*    BNPNo results for input(s): BNP, PROBNP in the last 168 hours.   DDimer No results for input(s): DDIMER in the last 168 hours.   Radiology    DG Chest 2 View  Result Date: 01/28/2020 CLINICAL DATA:  Pleural effusion. EXAM: CHEST - 2 VIEW COMPARISON:  01/26/2020 FINDINGS: 0538 hours. The cardio pericardial silhouette is enlarged. Interval progression of left base atelectasis/infiltrate with small effusion. Right lung clear. Right IJ sheath has been removed in the interval. Telemetry leads overlie the chest.  IMPRESSION: Interval progression of left base atelectasis/infiltrate with small effusion. Electronically Signed   By: Misty Stanley M.D.   On: 01/28/2020 06:21    Cardiac Studies   Left heart cath 01/22/20:  Severe calcific coronary disease involving the left anterior descending, circumflex, and left main.  40 to 50% eccentric distal left main  Bulky eccentric 80% ostial to proximal LAD showing progression compared to 2020. Moderate mid to distal diffuse LAD disease.  Diffuse ostial to proximal in-stent restenosis in circumflex which has 2 layers of stent. Moderate mid to distal circumflex 50% narrowing.  Occlusion of ramus intermedius, very small and fills by left to left collaterals.  Dominant right coronary with luminal irregularities but no high-grade obstruction.  Normal anterior wall motion. Estimated >EF 50%. Inferior wall not well seen. LVEDP is normal.   Diagnostic Dominance: Right     Echo 01/21/20: 1. Left ventricular ejection fraction, by estimation, is 55 to 60%. The  left ventricle has normal function. The left ventricle has no regional  wall motion abnormalities. There is mild concentric left ventricular  hypertrophy. Left ventricular diastolic  parameters are consistent with Grade I diastolic dysfunction (impaired  relaxation).  2. Right ventricular systolic function is normal. The right ventricular  size is normal.  3. Left atrial size was mildly dilated.  4. The mitral valve is abnormal. Trivial mitral valve regurgitation.  Moderate mitral annular calcification.  5. The aortic valve is abnormal. There is moderate calcification of the  aortic valve. There is moderate thickening of the aortic valve. Aortic  valve regurgitation is not visualized. Mild aortic valve stenosis.   Patient Profile     80 y.o. male with hx of CAD s/p stenting, mild AS, HTN, HLD and Type 2 DM who presented 12/27 for angina. Underwent 01/22/20 LHC showing new prox  LAD and ISR of LCx stents- normal EF-> Recommended for CABG, performed 01/24/20 (LIMA-LAD, SVR to OM).  Had post op afib.   Assessment & Plan    1. CAD s/p CABG x 2 - Ambulating without chest pain. LVEF of 55-60% by echo.  - Continue ASA, Plan to start Plavix tomorrow.  - Continue Statin, Coreg and Lisinopril  2. Post op atrial fibrillation - Converted to sinus rhythm on IV amiodarone.  - HR elevated this morning  after ambulation - Continue Amiodarone 400mg  BID (review length of therapy during outpatient follow up) and Coreg 25mg  qd  3. HLD - 01/22/2020: Cholesterol 132; HDL 52; LDL Cholesterol 67; Triglycerides 67; VLDL 13  - Continue high intensity statin   4. HTN - Continue Coreg and Lisinopril  5. Mild AS 6. DM For questions or updates, please contact [place] Please consult [email or web address] for contact info under        Signed, Leanor Kail, PA  01/28/2020, 8:57 AM    Agree with note by Robbie Lis PA-C  Recovery #4 CABG x2.  He had PAF on IV amnio male in sinus rhythm.  Converted to p.o. amnio load (4 mg p.o. twice daily) also on beta-blocker.  Patient of Dr. Domenic Polite.  I suspect he will be on amiodarone for up to 3 months after which it can be discontinued at the discretion of his primary cardiologist.  Exam is benign today.  We will sign off and arrange outpatient follow-up.  Lorretta Harp, M.D., [place], [facility], [place] [address] [place], [place]  [address]  [phone] 01/28/2020 10:31 AM

## 2020-01-29 ENCOUNTER — Other Ambulatory Visit (HOSPITAL_COMMUNITY): Payer: Self-pay | Admitting: Physician Assistant

## 2020-01-29 LAB — GLUCOSE, CAPILLARY
Glucose-Capillary: 178 mg/dL — ABNORMAL HIGH (ref 70–99)
Glucose-Capillary: 196 mg/dL — ABNORMAL HIGH (ref 70–99)
Glucose-Capillary: 171 mg/dL — ABNORMAL HIGH (ref 70–99)
Glucose-Capillary: 197 mg/dL — ABNORMAL HIGH (ref 70–99)

## 2020-01-29 MED ORDER — AMIODARONE IV BOLUS ONLY 150 MG/100ML
150.0000 mg | Freq: Once | INTRAVENOUS | Status: AC
  Administered 2020-01-29: 150 mg via INTRAVENOUS
  Filled 2020-01-29: qty 100

## 2020-01-29 MED ORDER — LISINOPRIL 10 MG PO TABS
20.0000 mg | ORAL_TABLET | Freq: Every day | ORAL | Status: DC
  Administered 2020-01-29: 20 mg via ORAL
  Filled 2020-01-29: qty 2

## 2020-01-29 MED ORDER — TRAMADOL HCL 50 MG PO TABS: 50.0000 mg | ORAL_TABLET | ORAL | 0 refills | Status: DC | PRN

## 2020-01-29 MED ORDER — ACETAMINOPHEN 500 MG PO TABS: 1000.0000 mg | ORAL_TABLET | Freq: Four times a day (QID) | ORAL | 0 refills | Status: DC | PRN

## 2020-01-29 MED ORDER — LISINOPRIL 5 MG PO TABS: 2.5000 mg | ORAL_TABLET | Freq: Every day | ORAL | 3 refills | Status: DC

## 2020-01-29 MED ORDER — AMIODARONE HCL 200 MG PO TABS: 400.0000 mg | ORAL_TABLET | Freq: Two times a day (BID) | ORAL | 0 refills | Status: DC

## 2020-01-29 MED FILL — traMADol HCL 50 MG TABS: 50 | 4 days supply | Qty: 30 | Fill #0

## 2020-01-29 MED FILL — AMIODARONE HCL 200 MG TABS: 200 | 60 days supply | Qty: 90 | Fill #0

## 2020-01-29 NOTE — Progress Notes (Signed)
Mobility Specialist - Progress Note   01/29/20 1339  Mobility  Activity Ambulated in hall  Level of Assistance Minimal assist, patient does 75% or more  Assistive Device None  Distance Ambulated (ft) 450 ft  Mobility Response Tolerated well  Mobility performed by Mobility specialist  $Mobility charge 1 Mobility   Pre-mobility: 92 HR, 96% SpO2 During mobility: 114 HR Post-mobility: 104 HR, 98% SpO2  Asx throughout ambulation. To recliner after walk. Pt was min assist to elevate trunk when getting out of bed, otherwise standby assist.   Mamie Levers Mobility Specialist Mobility Specialist Phone: [phone]

## 2020-01-29 NOTE — Progress Notes (Signed)
Patient HR went up to 150s while ambulating. EKG obtained Afib RVR. Paged PA. Ordered Amiodarone 150 IV  bolus x1 and to DC discharge. Will continue to monitor.  Sabra Heck, RN

## 2020-01-29 NOTE — Consult Note (Signed)
   [facility] Inpatient Consult   01/29/2020  Sean Villarreal 10/16/1940 [id number]   [facility] [[facility]]  Accountable Care Organization [ACO] Patient:   Patient screened for length of stay hospitalization to check for potential [facility]  [[facility]] Care Management service needs.  Review of patient's medical record reveals patient is being recommended for Cardiac Rehab Phase II noted by Cardiac rehab nurse notes.  Primary Care Provider is Selinda Flavin, MD, [facility],  this provider is listed to provide the transition of care [TOC] for post hospital follow up.   Plan:  Continue to follow progress and disposition to assess for post hospital care management needs.  Patient's transition of care needs currently is with General EMMI Discharge follow up.  Please place a [facility] Care Management consult as appropriate and for questions contact:   Charlesetta Shanks, RN BSN CCM [facility]  [phone] business mobile phone Toll free office [phone]  Fax number: [phone] [email or web address]

## 2020-01-29 NOTE — Progress Notes (Signed)
CARDIAC REHAB PHASE I   PRE:  Rate/Rhythm: 106 ST  BP:  Supine: 155/80  Sitting:  Standing:    SaO2: 95%RA  MODE:  Ambulation: 350 ft   POST:  Rate/Rhythm: 150's afib    120's with rest  BP:  Supine: 151/87  Sitting:   Standing:    SaO2: 97%RA [id number] Education completed with pt who voiced understanding. Stressed importance of adhering to sternal precautions and staying in the tube. Encouraged IS. Discussed carb counting and heart healthy food choices. Gave walking instructions for exercise. Discussed CRP 2 and referral letter will be sent to [facility]. Offered to walk with pt since he was not going to be discharged for a few hours. Pt walked 350 ft on RA with hand held asst and went into atrial fib. Pt was not in Afib prior to walk. HR to 150's. Pt denied palpitations but stated he felt tired. To bed for EKG by RN. Pt notified wife that he might not be going home.    Luetta Nutting, RN BSN  01/29/2020 9:33 AM

## 2020-01-29 NOTE — Progress Notes (Signed)
      [address]       [place] [address]             [phone]      5 Days Post-Op Procedure(s) (LRB): CORONARY ARTERY BYPASS GRAFTING (CABG), ON PUMP, TIMES TWO, USING LEFT INTERNAL MAMMARY ARTERY AND ENDOSCOPICALLY HARVESTED RIGHT GREATER SAPHENOUS VEIN (N/A) TRANSESOPHAGEAL ECHOCARDIOGRAM (TEE) (N/A)   Subjective:  Patient feels great.  He is hoping to go home today.  Objective: Vital signs in last 24 hours: Temp:  [98.3 F (36.8 C)-98.8 F (37.1 C)] 98.7 F (37.1 C) (01/04 0725) Pulse Rate:  [89-95] 95 (01/04 0725) Cardiac Rhythm: Normal sinus rhythm (01/03 1900) Resp:  [15-22] 20 (01/04 0725) BP: (104-159)/(62-84) 152/77 (01/04 0725) SpO2:  [96 %-100 %] 96 % (01/04 0725) Weight:  [94.5 kg] 94.5 kg (01/04 0500)  Intake/Output from previous day: 01/03 0701 - 01/04 0700 In: 720 [P.O.:720] Out: 902 [Urine:900; Stool:2]  General appearance: alert, cooperative and no distress Heart: regular rate and rhythm Lungs: clear to auscultation bilaterally Abdomen: soft, non-tender; bowel sounds normal; no masses,  no organomegaly Extremities: edema trace Wound: clean and dry  Lab Results: Recent Labs    01/27/20 0213  WBC 14.1*  HGB 10.9*  HCT 32.7*  PLT 101*   BMET:  Recent Labs    01/27/20 0213 01/28/20 0104  NA 135 138  K 3.9 3.8  CL 102 102  CO2 23 24  GLUCOSE 126* 129*  BUN 10 13  CREATININE 0.73 0.78  CALCIUM 8.4* 8.5*    PT/INR: No results for input(s): LABPROT, INR in the last 72 hours. ABG    Component Value Date/Time   PHART 7.374 01/24/2020 1717   HCO3 24.3 01/24/2020 1717   TCO2 26 01/24/2020 1717   ACIDBASEDEF 1.0 01/24/2020 1717   O2SAT 97.0 01/24/2020 1717   CBG (last 3)  Recent Labs    01/28/20 1605 01/28/20 2124 01/29/20 0651  GLUCAP 181* 145* 178*    Assessment/Plan: S/P Procedure(s) (LRB): CORONARY ARTERY BYPASS GRAFTING (CABG), ON PUMP, TIMES TWO, USING LEFT INTERNAL MAMMARY ARTERY AND ENDOSCOPICALLY  HARVESTED RIGHT GREATER SAPHENOUS VEIN (N/A) TRANSESOPHAGEAL ECHOCARDIOGRAM (TEE) (N/A)  1. CV- NSR with occasional PVCs- continue Amiodarone, Coreg, BP is creeping up will increase Lisinopril to 5 mg daily 2. Pulm- off oxygen, no acute issues, continue IS 3. Renal- creatinine WNL, weight is trending down, edema remains present continue Lasix, potassium 4. DM- sugar control improved, continue home medications 5. dispo- patient doing well, titrate Lisinopril, will continue Lasix daily for 7 days, then patient may resume home reigmen of EOD, will d/c home today if okay with Dr. Dorris Fetch   LOS: 8 days   Lowella Dandy, PA-C 01/29/2020

## 2020-01-30 ENCOUNTER — Other Ambulatory Visit (HOSPITAL_COMMUNITY): Payer: Self-pay | Admitting: Physician Assistant

## 2020-01-30 LAB — GLUCOSE, CAPILLARY
Glucose-Capillary: 179 mg/dL — ABNORMAL HIGH (ref 70–99)
Glucose-Capillary: 168 mg/dL — ABNORMAL HIGH (ref 70–99)
Glucose-Capillary: 116 mg/dL — ABNORMAL HIGH (ref 70–99)
Glucose-Capillary: 155 mg/dL — ABNORMAL HIGH (ref 70–99)

## 2020-01-30 MED ORDER — APIXABAN 5 MG PO TABS
5.0000 mg | ORAL_TABLET | Freq: Two times a day (BID) | ORAL | Status: DC
  Administered 2020-01-30 – 2020-01-31 (×3): 5 mg via ORAL
  Filled 2020-01-30 (×3): qty 1

## 2020-01-30 MED ORDER — APIXABAN 5 MG PO TABS: 5.0000 mg | ORAL_TABLET | Freq: Two times a day (BID) | ORAL | 3 refills | Status: DC

## 2020-01-30 MED ORDER — LISINOPRIL 40 MG PO TABS
40.0000 mg | ORAL_TABLET | Freq: Every day | ORAL | Status: DC
  Administered 2020-01-30 – 2020-01-31 (×2): 40 mg via ORAL
  Filled 2020-01-30 (×2): qty 1

## 2020-01-30 MED FILL — ELIQUIS 5 MG TABLET: 5 | 30 days supply | Qty: 60 | Fill #0

## 2020-01-30 NOTE — Discharge Instructions (Signed)
Discharge Instructions:  1. You may shower, please wash incisions daily with soap and water and keep dry.  If you wish to cover wounds with dressing you may do so but please keep clean and change daily.  No tub baths or swimming until incisions have completely healed.  If your incisions become red or develop any drainage please call our office at [phone]  2. No Driving until cleared by Dr. Hendrickson's office and you are no longer using narcotic pain medications  3. Monitor your weight daily.. Please use the same scale and weigh at same time... If you gain 5-10 lbs in 48 hours with associated lower extremity swelling, please contact our office at [phone]  4. Fever of 101.5 for at least 24 hours with no source, please contact our office at [phone]  5. Activity- up as tolerated, please walk at least 3 times per day.  Avoid strenuous activity, no lifting, pushing, or pulling with your arms over 8-10 lbs for a minimum of 6 weeks  6. If any questions or concerns arise, please do not hesitate to contact our office at [phone]   Information on my medicine - ELIQUIS (apixaban)  Why was Eliquis prescribed for you? Eliquis was prescribed for you to reduce the risk of a blood clot forming that can cause a stroke if you have a medical condition called atrial fibrillation (a type of irregular heartbeat).  What do You need to know about Eliquis ? Take your Eliquis TWICE DAILY - one tablet in the morning and one tablet in the evening with or without food. If you have difficulty swallowing the tablet whole please discuss with your pharmacist how to take the medication safely.  Take Eliquis exactly as prescribed by your doctor and DO NOT stop taking Eliquis without talking to the doctor who prescribed the medication.  Stopping may increase your risk of developing a stroke.  Refill your prescription before you run out.  After discharge, you should have regular check-up appointments  with your healthcare provider that is prescribing your Eliquis.  In the future your dose may need to be changed if your kidney function or weight changes by a significant amount or as you get older.  What do you do if you miss a dose? If you miss a dose, take it as soon as you remember on the same day and resume taking twice daily.  Do not take more than one dose of ELIQUIS at the same time to make up a missed dose.  Important Safety Information A possible side effect of Eliquis is bleeding. You should call your healthcare provider right away if you experience any of the following: Bleeding from an injury or your nose that does not stop. Unusual colored urine (red or dark brown) or unusual colored stools (red or black). Unusual bruising for unknown reasons. A serious fall or if you hit your head (even if there is no bleeding).  Some medicines may interact with Eliquis and might increase your risk of bleeding or clotting while on Eliquis. To help avoid this, consult your healthcare provider or pharmacist prior to using any new prescription or non-prescription medications, including herbals, vitamins, non-steroidal anti-inflammatory drugs (NSAIDs) and supplements.  This website has more information on Eliquis (apixaban): http://www.eliquis.com/eliquis/home   

## 2020-01-30 NOTE — Progress Notes (Signed)
Mobility Specialist - Progress Note   01/30/20 1112  Mobility  Activity Ambulated in hall  Level of Assistance Standby assist, set-up cues, supervision of patient - no hands on  Assistive Device None  Distance Ambulated (ft) 380 ft  Mobility Response Tolerated well  Mobility performed by Mobility specialist  $Mobility charge 1 Mobility   Pre-mobility: 92 HR During mobility: 109 HR Post-mobility: 96 HR  Pt asx throughout ambulation. No assistance w/ bed mobility once head of bed was elevated. Pt back in bed after walk.   Mamie Levers Mobility Specialist Mobility Specialist Phone: [phone]

## 2020-01-30 NOTE — Progress Notes (Signed)
Mobility Specialist - Progress Note   01/30/20 1437  Mobility  Activity Ambulated in hall  Level of Assistance Standby assist, set-up cues, supervision of patient - no hands on  Assistive Device None  Distance Ambulated (ft) 400 ft  Mobility Response Tolerated well  Mobility performed by Mobility specialist  $Mobility charge 1 Mobility   Pre-mobility: 92 HR During mobility: 108 HR Post-mobility: 95 HR  Asx throughout ambulation. Pt back in bed after walk. He says he is feeling more confident w/ ambulation.   Mamie Levers Mobility Specialist Mobility Specialist Phone: [phone]

## 2020-01-30 NOTE — Progress Notes (Addendum)
      [address]       [place] [address]             [phone]      6 Days Post-Op Procedure(s) (LRB): CORONARY ARTERY BYPASS GRAFTING (CABG), ON PUMP, TIMES TWO, USING LEFT INTERNAL MAMMARY ARTERY AND ENDOSCOPICALLY HARVESTED RIGHT GREATER SAPHENOUS VEIN (N/A) TRANSESOPHAGEAL ECHOCARDIOGRAM (TEE) (N/A)   Subjective:  No new complaints.  Continues to feel pretty good.  His right leg is more sore this morning, he thinks he may have moved it the wrong way  Objective: Vital signs in last 24 hours: Temp:  [98.3 F (36.8 C)-99.1 F (37.3 C)] 98.3 F (36.8 C) (01/05 0418) Pulse Rate:  [86-97] 93 (01/05 0418) Cardiac Rhythm: Normal sinus rhythm;Bundle branch block (01/04 1900) Resp:  [19-20] 20 (01/05 0418) BP: (113-140)/(65-81) 140/72 (01/05 0418) SpO2:  [95 %-99 %] 99 % (01/05 0418) Weight:  [93.7 kg] 93.7 kg (01/05 0418)  Intake/Output from previous day: 01/04 0701 - 01/05 0700 In: 960 [P.O.:960] Out: 776 [Urine:775; Stool:1]  General appearance: alert, cooperative and no distress Heart: regular rate and rhythm Lungs: clear to auscultation bilaterally Abdomen: soft, non-tender; bowel sounds normal; no masses,  no organomegaly Extremities: edema trace Wound: clean and dry, ecchymosis RLE  Lab Results: No results for input(s): WBC, HGB, HCT, PLT in the last 72 hours. BMET: Recent Labs    01/28/20 0104  NA 138  K 3.8  CL 102  CO2 24  GLUCOSE 129*  BUN 13  CREATININE 0.78  CALCIUM 8.5*    PT/INR: No results for input(s): LABPROT, INR in the last 72 hours. ABG    Component Value Date/Time   PHART 7.374 01/24/2020 1717   HCO3 24.3 01/24/2020 1717   TCO2 26 01/24/2020 1717   ACIDBASEDEF 1.0 01/24/2020 1717   O2SAT 97.0 01/24/2020 1717   CBG (last 3)  Recent Labs    01/29/20 1614 01/29/20 2122 01/30/20 0611  GLUCAP 171* 197* 179*    Assessment/Plan: S/P Procedure(s) (LRB): CORONARY ARTERY BYPASS GRAFTING (CABG), ON PUMP, TIMES TWO,  USING LEFT INTERNAL MAMMARY ARTERY AND ENDOSCOPICALLY HARVESTED RIGHT GREATER SAPHENOUS VEIN (N/A) TRANSESOPHAGEAL ECHOCARDIOGRAM (TEE) (N/A)  1. CV- PAF, tachy arrhytmias overnight- currently in NSR- on Coreg 25 mg BI, BP remains elevated, will increase Lisinopril to home regimen 2. Pulm- no acute issues, continue IS 3. Renal- weight is trending down, continue lasix which he took prior to admission 4. RLE pain- ecchymosis present, I suspect this is the reason for his discomfort 5. DM- cbgs controlled, continue home medications 6. Dispo- patient stable, in NSR this morning... his CHADs VASC score is 5, may benefit from Eliquis temporarily with brief episodes of A. Fib and tachy arrythmias overnight.. will defer to Dr. Dorris Fetch.. patient remains stable this morning, ? Possibly discharge home today   LOS: 9 days    Lowella Dandy, PA-C 01/30/2020   Patient seen and examined, agree with above Given that he continues to have intermittent A fib, will start Eliquis  Steven C. Dorris Fetch, MD [facility] [phone]

## 2020-01-30 NOTE — Progress Notes (Signed)
ANTICOAGULATION CONSULT NOTE - Follow Up Consult  Pharmacy Consult for heparin Indication:   Labs: Recent Labs    01/28/20 0104  CREATININE 0.78    Assessment: 80yo male now s/p cabg 12/30. Experienced post-op afib yesterday prior to planned discharge. Patient continues on po amiodarone. Surgery now recommending placing patient on apixaban for anticoagulation. Does not meet any criteria for dose adjustments.    Plan:  Start apixaban 5mg  bid  PharmD., BCPS Clinical Pharmacist 01/30/2020 9:42 AM

## 2020-01-30 NOTE — Progress Notes (Signed)
CARDIAC REHAB PHASE I   PRE:  Rate/Rhythm: 96 SR  BP:  Supine: 140/75  Sitting:   Standing:    SaO2: 94%RA  MODE:  Ambulation: 400 ft   POST:  Rate/Rhythm: 125 ST PACs     112 sitting in recliner BP:  Supine:   Sitting: 158/96  Standing:    SaO2: 97%RA 0832-0857 Pt walked 400 ft on RA with hand held asst with slow steady gait. Tolerated well. HR to 125 with PACs. To recliner with call bell. HR to 112 ST with rest. Hoping to go home today.   Luetta Nutting, RN BSN  01/30/2020 8:53 AM

## 2020-01-30 NOTE — TOC Benefit Eligibility Note (Signed)
Transition of Care [facility]) Benefit Eligibility Note    Patient Details  Name: Sean Villarreal MRN: [id number] Date of Birth: [birth year]   Medication/Dose: Eliquis 5mg . bid 30 day supply  Covered?: Yes  Tier: 3 Drug  Prescription Coverage Preferred Pharmacy: Any Pharmacy  Spoke with Person/Company/Phone Number:: [phone]. [facility] [facility]arm[facility] [phone]  Co-Pay: $72.00  Prior Approval: No  Deductible:  (no deductible)       #[phone] Phone Number: 01/30/2020, 12:17 PM

## 2020-01-30 NOTE — Plan of Care (Signed)
  Problem: Education: Goal: Ability to demonstrate management of disease process will improve Outcome: Progressing Goal: Ability to verbalize understanding of medication therapies will improve Outcome: Progressing Goal: Individualized Educational Video(s) Outcome: Progressing   Problem: Activity: Goal: Capacity to carry out activities will improve Outcome: Progressing   Problem: Cardiac: Goal: Ability to achieve and maintain adequate cardiopulmonary perfusion will improve Outcome: Progressing   Problem: Education: Goal: Knowledge of General Education information will improve Description: Including pain rating scale, medication(s)/side effects and non-pharmacologic comfort measures Outcome: Progressing   Problem: Health Behavior/Discharge Planning: Goal: Ability to manage health-related needs will improve Outcome: Progressing   Problem: Clinical Measurements: Goal: Ability to maintain clinical measurements within normal limits will improve Outcome: Progressing Goal: Will remain free from infection Outcome: Progressing Goal: Diagnostic test results will improve Outcome: Progressing Goal: Respiratory complications will improve Outcome: Progressing Goal: Cardiovascular complication will be avoided Outcome: Progressing   Problem: Activity: Goal: Risk for activity intolerance will decrease Outcome: Progressing   Problem: Nutrition: Goal: Adequate nutrition will be maintained Outcome: Progressing   Problem: Coping: Goal: Level of anxiety will decrease Outcome: Progressing   Problem: Elimination: Goal: Will not experience complications related to bowel motility Outcome: Progressing Goal: Will not experience complications related to urinary retention Outcome: Progressing   Problem: Pain Managment: Goal: General experience of comfort will improve Outcome: Progressing   Problem: Safety: Goal: Ability to remain free from injury will improve Outcome: Progressing    Problem: Skin Integrity: Goal: Risk for impaired skin integrity will decrease Outcome: Progressing   Problem: Education: Goal: Will demonstrate proper wound care and an understanding of methods to prevent future damage Outcome: Progressing Goal: Knowledge of disease or condition will improve Outcome: Progressing Goal: Knowledge of the prescribed therapeutic regimen will improve Outcome: Progressing Goal: Individualized Educational Video(s) Outcome: Progressing   Problem: Activity: Goal: Risk for activity intolerance will decrease Outcome: Progressing   Problem: Cardiac: Goal: Will achieve and/or maintain hemodynamic stability Outcome: Progressing   Problem: Clinical Measurements: Goal: Postoperative complications will be avoided or minimized Outcome: Progressing   Problem: Respiratory: Goal: Respiratory status will improve Outcome: Progressing   Problem: Skin Integrity: Goal: Wound healing without signs and symptoms of infection Outcome: Progressing Goal: Risk for impaired skin integrity will decrease Outcome: Progressing   Problem: Urinary Elimination: Goal: Ability to achieve and maintain adequate renal perfusion and functioning will improve Outcome: Progressing

## 2020-01-31 LAB — GLUCOSE, CAPILLARY: Glucose-Capillary: 167 mg/dL — ABNORMAL HIGH (ref 70–99)

## 2020-01-31 NOTE — Progress Notes (Signed)
[address]       [place] [address]             [phone]      7 Days Post-Op Procedure(s) (LRB): CORONARY ARTERY BYPASS GRAFTING (CABG), ON PUMP, TIMES TWO, USING LEFT INTERNAL MAMMARY ARTERY AND ENDOSCOPICALLY HARVESTED RIGHT GREATER SAPHENOUS VEIN (N/A) TRANSESOPHAGEAL ECHOCARDIOGRAM (TEE) (N/A) Subjective: Maintaining sinus rhythm  Objective: Vital signs in last 24 hours: Temp:  [97.8 F (36.6 C)-98.7 F (37.1 C)] 97.8 F (36.6 C) (01/06 0526) Pulse Rate:  [88-94] 88 (01/06 0526) Cardiac Rhythm: Normal sinus rhythm;Bundle branch block (01/05 1900) Resp:  [17-20] 20 (01/06 0526) BP: (109-140)/(64-83) 135/74 (01/06 0526) SpO2:  [94 %-97 %] 97 % (01/06 0526) Weight:  [92.4 kg] 92.4 kg (01/06 0526)  Hemodynamic parameters for last 24 hours:    Intake/Output from previous day: 01/05 0701 - 01/06 0700 In: 340 [P.O.:240] Out: 1350 [Urine:1350] Intake/Output this shift: No intake/output data recorded.  General appearance: alert, cooperative and no distress Heart: regular rate and rhythm and soft systolic murmur Lungs: clear to auscultation bilaterally Abdomen: benign Extremities: no edema Wound: incis healing well  Lab Results: No results for input(s): WBC, HGB, HCT, PLT in the last 72 hours. BMET: No results for input(s): NA, K, CL, CO2, GLUCOSE, BUN, CREATININE, CALCIUM in the last 72 hours.  PT/INR: No results for input(s): LABPROT, INR in the last 72 hours. ABG    Component Value Date/Time   PHART 7.374 01/24/2020 1717   HCO3 24.3 01/24/2020 1717   TCO2 26 01/24/2020 1717   ACIDBASEDEF 1.0 01/24/2020 1717   O2SAT 97.0 01/24/2020 1717   CBG (last 3)  Recent Labs    01/30/20 1541 01/30/20 2113 01/31/20 0613  GLUCAP 116* 155* 167*    Meds Scheduled Meds: . amiodarone  400 mg Oral BID  . apixaban  5 mg Oral BID  . aspirin EC  81 mg Oral Daily  . atorvastatin  80 mg Oral QHS  . bisacodyl  10 mg Oral Daily   Or  .  bisacodyl  10 mg Rectal Daily  . carvedilol  25 mg Oral BID WC  . Chlorhexidine Gluconate Cloth  6 each Topical Daily  . docusate sodium  200 mg Oral Daily  . furosemide  40 mg Oral Daily  . glimepiride  1 mg Oral Q breakfast  . insulin aspart  0-24 Units Subcutaneous TID WC & HS  . insulin aspart  4 Units Subcutaneous TID WC  . lactulose  20 g Oral Once  . lisinopril  40 mg Oral Daily  . mouth rinse  15 mL Mouth Rinse BID  . metFORMIN  1,000 mg Oral BID WC  . pantoprazole  40 mg Oral Daily  . potassium chloride  40 mEq Oral Daily  . sodium chloride flush  3 mL Intravenous Q12H  . tamsulosin  0.4 mg Oral BID   Continuous Infusions: . sodium chloride    . sodium chloride    . lactated ringers    . lactated ringers    . lactated ringers 20 mL/hr at 01/26/20 0600   PRN Meds:.sodium chloride, dextrose, diphenhydrAMINE, lactated ringers, metoprolol tartrate, ondansetron (ZOFRAN) IV, oxyCODONE, sodium chloride flush, traMADol  Xrays No results found.  Assessment/Plan: S/P Procedure(s) (LRB): CORONARY ARTERY BYPASS GRAFTING (CABG), ON PUMP, TIMES TWO, USING LEFT INTERNAL MAMMARY ARTERY AND ENDOSCOPICALLY HARVESTED RIGHT GREATER SAPHENOUS VEIN (N/A) TRANSESOPHAGEAL ECHOCARDIOGRAM (TEE) (N/A)  1 afeb, VSS, BP control improving 2  sats good on RA 3 BS well controlled for hospitalized patient 4 good UOP- weight below preop- will d/c lasix 5 maintaining sinus on current RX 6 stable for d/c       LOS: 10 days    John Giovanni PA-C Pager [phone] 01/31/2020

## 2020-01-31 NOTE — Progress Notes (Signed)
CARDIAC REHAB PHASE I   PRE:  Rate/Rhythm: 96 SR    BP: sitting 113/64    SaO2: 96 RA  MODE:  Ambulation: 430 ft   POST:  Rate/Rhythm: 124 ST    BP: sitting 123/70     SaO2: 95 RA  Pt got out of bed with min assist and ambulated independently. No c/o. HR maintained ST. PACs after walk. Reviewed ed, answered questions, gave pt Off the Beat book at his request.  [phone]   Harriet Masson CES, ACSM 01/31/2020 8:47 AM

## 2020-01-31 NOTE — Progress Notes (Signed)
Discharge paperwork reviewed with patient. Medication changes, follow-up appointments and incision care discussed. All questions answered. IV removed and TOC pharmacy medications delivered to room. Patient to be escorted home by wife and family friend.   Allegra Grana RN

## 2020-02-04 ENCOUNTER — Telehealth (HOSPITAL_COMMUNITY): Payer: Self-pay

## 2020-02-04 NOTE — Telephone Encounter (Signed)
Cardiac rehab referral for Ph.II faxed to [place]. 

## 2020-02-06 ENCOUNTER — Telehealth: Payer: Self-pay | Admitting: *Deleted

## 2020-02-06 NOTE — Telephone Encounter (Signed)
Returned [name]'s telephone call regarding swelling in bilateral lower extremities. Pt states he noticed slight swelling of his legs last night. He states his weight is up slightly around 2-3 pounds since discharge from the hospital on 1/6, however he states he is still below preoperative weight. Pt states his appetite has increased and he is eating well. He denies any SOB. Pt encouraged to keep his legs elevated during the day and to use compression stockings if able. Pt to call back if swelling or weight gain becomes worse or if he becomes SOB. Pt verbalized understanding.

## 2020-02-07 NOTE — Progress Notes (Deleted)
Cardiology Office Note  Date: 02/07/2020   ID: Sean Villarreal, DOB 1940/02/23, MRN [id number]  PCP:  Rory Percy, MD  Cardiologist:  Rozann Lesches, MD Electrophysiologist:  None   Chief Complaint: Status post CABG x4  History of Present Illness: Sean Villarreal is a 80 y.o. male with a history of CAD, HTN, NSTEMI, unstable angina, GERD, type 2 diabetes, hyperlipidemia.  Previous history of CAD with DES to LAD and LCx in 2014, PTCA to ostial LCx 2018 due to ISR.  DES to ostial LCx September 2018 due to ISR.  Patent stents by repeat cath March 2020.  Last encounter with Dr. Domenic Polite 12/11/2019.  He has been having recurrent anginal symptoms and discomfort in his shoulder blades not requiring nitroglycerin.  Follow-up echo in June with EF of 65 to 70%.  Mild left atrial enlargement, mildly stenotic aortic valve mean gradient 11 mmHg.  Plan was to increase Imdur to 30 mg.  Plan to continue aspirin, Norvasc, Lipitor, Coreg, Plavix, lisinopril.  Advised to symptoms escalated would need to consider a follow-up cardiac catheterization to reevaluate for revascularization options.  Had mild calcific aortic stenosis by follow-up echo in June which was asymptomatic.  He remained on Lipitor with good LDL control.  Presented to [facility] ED on 12/262021 with complaints of chest pressure and pain radiating to his shoulder blades and dizziness.  Also had accompanying shortness of breath at times. Patient underwent CABG with bypass grafting x2 (LIMA to LAD, SVG to OM1) endoscopic vein harvest right thigh by Dr. Roxan Hockey 01/24/2020.   Past Medical History:  Diagnosis Date  . Arthritis   . Basal cell carcinoma   . Coronary artery disease    DES to LAD and circumflex April 2014, PTCA ostial circumflex 03/2016 due to ISR, 9/18 PCI/DESx2 osital Lcx (ISR), p/mLCx   . Dyslipidemia   . Essential hypertension   . GERD (gastroesophageal reflux disease)   . Hematuria   . History of blood  transfusion 09/2012  . History of kidney stones   . NSTEMI (non-ST elevated myocardial infarction) ([facility]) 08/2012  . Type II diabetes mellitus ([place])     Past Surgical History:  Procedure Laterality Date  . BASAL CELL CARCINOMA EXCISION     "right cheek; both shoulders"  . CARDIAC CATHETERIZATION    . CATARACT EXTRACTION W/ INTRAOCULAR LENS  IMPLANT, BILATERAL Bilateral   . COLONOSCOPY N/A 03/19/2016   Procedure: COLONOSCOPY;  Surgeon: Rogene Houston, MD;  [facility];  Service: Endoscopy;  Laterality: N/A;  9:15  . CORONARY ARTERY BYPASS GRAFT N/A 01/24/2020   Procedure: CORONARY ARTERY BYPASS GRAFTING (CABG), ON PUMP, TIMES TWO, USING LEFT INTERNAL MAMMARY ARTERY AND ENDOSCOPICALLY HARVESTED RIGHT GREATER SAPHENOUS VEIN;  Surgeon: Melrose Nakayama, MD;  Location: [facility];  Service: Open Heart Surgery;  Laterality: N/A;  . CORONARY BALLOON ANGIOPLASTY N/A 04/15/2016   Procedure: Coronary Balloon Angioplasty;  Surgeon: Peter M Martinique, MD;  Location: [facility];  Service: Cardiovascular;  Laterality: N/A;  . CORONARY STENT INTERVENTION N/A 10/11/2016   Procedure: CORONARY STENT INTERVENTION;  Surgeon: Jettie Booze, MD;  Location: [facility];  Service: Cardiovascular;  Laterality: N/A;  . CYSTOSCOPY W/ URETEROSCOPY W/ LITHOTRIPSY  10/2012   Archie Endo 11/10/2012  . CYSTOSCOPY WITH STENT PLACEMENT  08/2012; 09/2012  . EYE SURGERY Left ~ 02/2016   "for crinkled lens"  . LEFT HEART CATH AND CORONARY ANGIOGRAPHY N/A 04/15/2016   Procedure: Left Heart Cath and  Coronary Angiography;  Surgeon: Peter M Martinique, MD;  Location: [facility];  Service: Cardiovascular;  Laterality: N/A;  . LEFT HEART CATH AND CORONARY ANGIOGRAPHY N/A 10/11/2016   Procedure: LEFT HEART CATH AND CORONARY ANGIOGRAPHY;  Surgeon: Jettie Booze, MD;  Location: [facility];  Service: Cardiovascular;  Laterality: N/A;  . LEFT HEART CATH AND CORONARY ANGIOGRAPHY N/A 03/31/2018    Procedure: LEFT HEART CATH AND CORONARY ANGIOGRAPHY;  Surgeon: Troy Sine, MD;  Location: [facility];  Service: Cardiovascular;  Laterality: N/A;  . LEFT HEART CATH AND CORONARY ANGIOGRAPHY N/A 01/21/2020   Procedure: LEFT HEART CATH AND CORONARY ANGIOGRAPHY;  Surgeon: Belva Crome, MD;  Location: [facility];  Service: Cardiovascular;  Laterality: N/A;  . LEFT HEART CATHETERIZATION WITH CORONARY ANGIOGRAM N/A 05/05/2012   Procedure: LEFT HEART CATHETERIZATION WITH CORONARY ANGIOGRAM;  Surgeon: Burnell Blanks, MD;  Location: [facility];  Service: Cardiovascular;  Laterality: N/A;  . LEFT HEART CATHETERIZATION WITH CORONARY ANGIOGRAM N/A 05/15/2012   Procedure: LEFT HEART CATHETERIZATION WITH CORONARY ANGIOGRAM;  Surgeon: Burnell Blanks, MD;  Location: [facility] CATH LAB;  Service: Cardiovascular;  Laterality: N/A;  . TEE WITHOUT CARDIOVERSION N/A 01/24/2020   Procedure: TRANSESOPHAGEAL ECHOCARDIOGRAM (TEE);  Surgeon: Melrose Nakayama, MD;  Location: [facility];  Service: Open Heart Surgery;  Laterality: N/A;  . TONSILLECTOMY  1947    Current Outpatient Medications  Medication Sig Dispense Refill  . acetaminophen (TYLENOL) 500 MG tablet Take 2 tablets (1,000 mg total) by mouth every 6 (six) hours as needed. 30 tablet 0  . amiodarone (PACERONE) 200 MG tablet Take 2 tablets (400 mg total) by mouth 2 (two) times daily. X 7 days, then decrease to 200 mg BID x 7 days, then decrease to 200 mg daily 90 tablet 0  . apixaban (ELIQUIS) 5 MG TABS tablet Take 1 tablet (5 mg total) by mouth 2 (two) times daily. 60 tablet 3  . aspirin 81 MG tablet Take 81 mg by mouth daily.    [name] atorvastatin (LIPITOR) 80 MG tablet Take 80 mg by mouth at bedtime.    . carvedilol (COREG) 25 MG tablet Take 1 tablet (25 mg total) by mouth 2 (two) times daily with a meal. 60 tablet 11  . glimepiride (AMARYL) 1 MG tablet Take 1 mg by mouth daily with breakfast.     . ketoconazole (NIZORAL) 2 % cream  Apply 1 application topically 2 (two) times daily as needed (apply to face as needed (uses during the winter)). For dry skin    . lisinopril (PRINIVIL,ZESTRIL) 40 MG tablet Take 1 tablet (40 mg total) by mouth daily. 30 tablet 3  . metFORMIN (GLUCOPHAGE) 1000 MG tablet Take 1 tablet (1,000 mg total) by mouth 2 (two) times daily.    . nitroGLYCERIN (NITROSTAT) 0.4 MG SL tablet DISSOLVE 1 TABLET UNDER THE TONGUE EVERY 5 MINUTES AS  NEEDED FOR CHEST PAIN. MAX  OF 3 TABLETS IN 15 MINUTES. CALL 911 IF PAIN PERSISTS. (Patient taking differently: Place 0.4 mg under the tongue every 5 (five) minutes as needed.) 100 tablet 3  . psyllium (METAMUCIL SMOOTH TEXTURE) 28 % packet Take 1 packet by mouth at bedtime.    . tamsulosin (FLOMAX) 0.4 MG CAPS capsule Take 0.4 mg by mouth 2 (two) times daily.     . traMADol (ULTRAM) 50 MG tablet Take 1-2 tablets (50-100 mg total) by mouth every 4 (four) hours as needed for moderate pain. [place]  tablet 0   No current facility-administered medications for this visit.   Allergies:  Contrast media [iodinated diagnostic agents] and Penicillins   Social History: The patient  reports that he quit smoking about 31 years ago. His smoking use included cigarettes. He started smoking about 38 years ago. He has a 22.50 pack-year smoking history. He has never used smokeless tobacco. He reports previous alcohol use. He reports that he does not use drugs.   Family History: The patient's family history includes Heart attack in his father; Heart disease in his father and mother; Heart failure in his mother.   ROS:  Please see the history of present illness. Otherwise, complete review of systems is positive for {NONE DEFAULTED:18576::"none"}.  All other systems are reviewed and negative.   Physical Exam: VS:  There were no vitals taken for this visit., BMI There is no height or weight on file to calculate BMI.  Wt Readings from Last 3 Encounters:  01/31/20 203 lb 11.3 oz (92.4 kg)   12/11/19 212 lb (96.2 kg)  06/07/19 225 lb (102.1 kg)    General: Patient appears comfortable at rest. HEENT: Conjunctiva and lids normal, oropharynx clear with moist mucosa. Neck: Supple, no elevated JVP or carotid bruits, no thyromegaly. Lungs: Clear to auscultation, nonlabored breathing at rest. Cardiac: Regular rate and rhythm, no S3 or significant systolic murmur, no pericardial rub. Abdomen: Soft, nontender, no hepatomegaly, bowel sounds present, no guarding or rebound. Extremities: No pitting edema, distal pulses 2+. Skin: Warm and dry. Musculoskeletal: No kyphosis. Neuropsychiatric: Alert and oriented x3, affect grossly appropriate.  ECG:  {EKG/Telemetry Strips [phone]}  Recent Labwork: 01/20/2020: ALT 23; AST 20 01/25/2020: Magnesium 1.8 01/27/2020: Hemoglobin 10.9; Platelets 101 01/28/2020: BUN 13; Creatinine, Ser 0.78; Potassium 3.8; Sodium 138     Component Value Date/Time   CHOL 132 01/22/2020 0637   TRIG 67 01/22/2020 0637   HDL 52 01/22/2020 0637   CHOLHDL 2.5 01/22/2020 0637   VLDL 13 01/22/2020 0637   LDLCALC 67 01/22/2020 0637    Other Studies Reviewed Today:   01/21/2020 LEFT HEART CATH AND CORONARY ANGIOGRAPHY   Conclusion   Severe calcific coronary disease involving the left anterior descending, circumflex, and left main.  40 to 50% eccentric distal left main  Bulky eccentric 80% ostial to proximal LAD showing progression compared to 2020.  Moderate mid to distal diffuse LAD disease.  Diffuse ostial to proximal in-stent restenosis in circumflex which has 2 layers of stent.  Moderate mid to distal circumflex 50% narrowing.  Occlusion of ramus intermedius, very small and fills by left to left collaterals.  Dominant right coronary with luminal irregularities but no high-grade obstruction.  Normal anterior wall motion.  Estimated > EF 50%.  Inferior wall not well seen.  LVEDP is normal.  RECOMMENDATIONS:   Discontinue Plavix  IV  heparin  Recommend two-vessel coronary bypass surgery in the setting of significant proximal LAD disease and in-stent restenosis x3 of the ostial circumflex (2 layers of stent).  2D Doppler echocardiogram to fully assess LV function.  Diagnostic Dominance: Right     Echocardiogram 01/21/2020  1. Left ventricular ejection fraction, by estimation, is 55 to 60%. The left ventricle has normal function. The left ventricle has no regional wall motion abnormalities. There is mild concentric left ventricular hypertrophy. Left ventricular diastolic parameters are consistent with Grade I diastolic dysfunction (impaired relaxation). 2. Right ventricular systolic function is normal. The right ventricular size is normal. 3. Left atrial size was mildly dilated. 4. The  mitral valve is abnormal. Trivial mitral valve regurgitation. Moderate mitral annular calcification. 5. The aortic valve is abnormal. There is moderate calcification of the aortic valve. There is moderate thickening of the aortic valve. Aortic valve regurgitation is not visualized. Mild aortic valve stenosis. Comparison(s): A prior study was performed on 06/28/2019. No significant change from prior study.  Assessment and Plan:  1. CAD in native artery   2. S/P 2-vessel coronary artery bypass   3. Essential hypertension   4. Mixed hyperlipidemia   5. Type 2 diabetes mellitus without complication, without long-term current use of insulin ([place])    1. CAD in native artery ***  2. S/P 2-vessel coronary artery bypass ***  3. Essential hypertension ***  4. Mixed hyperlipidemia ***  5. Type 2 diabetes mellitus without complication, without long-term current use of insulin ([facility]) ***  Medication Adjustments/Labs and Tests Ordered: Current medicines are reviewed at length with the patient today.  Concerns regarding medicines are outlined above.   Disposition: Follow-up with ***  Signed, Levell July, NP 02/07/2020 4:42 PM     [facility] at [facility] [address], [place], [place] [address] Phone: [phone]; Fax: [phone]

## 2020-02-08 ENCOUNTER — Telehealth: Payer: Self-pay | Admitting: Cardiology

## 2020-02-08 ENCOUNTER — Ambulatory Visit: Payer: Medicare Other | Admitting: Family Medicine

## 2020-02-08 DIAGNOSIS — I10 Essential (primary) hypertension: Secondary | ICD-10-CM

## 2020-02-08 DIAGNOSIS — I2510 Atherosclerotic heart disease of native coronary artery without angina pectoris: Secondary | ICD-10-CM

## 2020-02-08 DIAGNOSIS — Z951 Presence of aortocoronary bypass graft: Secondary | ICD-10-CM

## 2020-02-08 DIAGNOSIS — E782 Mixed hyperlipidemia: Secondary | ICD-10-CM

## 2020-02-08 DIAGNOSIS — E119 Type 2 diabetes mellitus without complications: Secondary | ICD-10-CM

## 2020-02-08 MED ORDER — FUROSEMIDE 20 MG PO TABS: 20.0000 mg | ORAL_TABLET | Freq: Every day | ORAL | Status: DC

## 2020-02-08 MED ORDER — POTASSIUM CHLORIDE CRYS ER 20 MEQ PO TBCR: 10.0000 meq | EXTENDED_RELEASE_TABLET | Freq: Every day | ORAL | Status: DC

## 2020-02-08 NOTE — Telephone Encounter (Signed)
Called pt to r/s his apt w/ AQ today and pt stated he's slowly gaining weight. It's going up a little each day, swelling in feet some and last night he had some chest pain.   Pt states he's still really sore.   Please call pt - [phone]

## 2020-02-08 NOTE — Telephone Encounter (Signed)
I reviewed his lab work.  Renal function and potassium were normal recently.  With progressive weight gain, would have him start Lasix 40 mg daily with KCl 10 mEq daily through the weekend, then reduce Lasix to 20 mg daily.  Continue to track weight.

## 2020-02-08 NOTE — Telephone Encounter (Signed)
Had cabg 01/24/20.  Noticed fluid gain since then.  Stopped fluid meds when he was discharged from hospital.  Has noticeable swelling in feet.  Did have some chest pain yesterday evening, was only hurting with breathing, but did do a lot of walking yesterday.  Did get some relief with lying down.  Slept good last night.  No c/o SOB.  SATs running 94-96%.  206 by his scales at home initially - this morning - 210.    Is due to see Dr. Roxan Hockey on 02/26/2020.    States that he does have Lasix 20mg  at home - was on this prior to hospitalization every other day. Was stopped at discharge.

## 2020-02-08 NOTE — Telephone Encounter (Signed)
Patient notified.  Stated that he does have Lasix 20mg  & Potassium 51meq already at home.  He will do the 40mg  Lasix through the weekend, then 20mg  daily thereafter, and 1/2 tab of the Potassium to make the 31meq daily.   Also, reminded him about contacting office if symptoms persist.  (3 pound weight gain in 1 day / 5 pound weight gain 1 week).  He verbalized understanding.

## 2020-02-12 ENCOUNTER — Ambulatory Visit: Payer: Medicare Other | Admitting: Family Medicine

## 2020-02-13 ENCOUNTER — Other Ambulatory Visit (HOSPITAL_COMMUNITY)
Admission: RE | Admit: 2020-02-13 | Discharge: 2020-02-13 | Disposition: A | Discharge status: Home / Self Care | Payer: Medicare Other | Source: Ambulatory Visit | Attending: Student | Admitting: Student

## 2020-02-13 ENCOUNTER — Encounter: Payer: Self-pay | Admitting: Student

## 2020-02-13 ENCOUNTER — Ambulatory Visit (INDEPENDENT_AMBULATORY_CARE_PROVIDER_SITE_OTHER): Payer: Medicare Other | Admitting: Student

## 2020-02-13 ENCOUNTER — Other Ambulatory Visit: Payer: Self-pay

## 2020-02-13 VITALS — BP 128/64 | HR 83 | Ht 68.0 in | Wt 202.0 lb

## 2020-02-13 DIAGNOSIS — I10 Essential (primary) hypertension: Secondary | ICD-10-CM | POA: Diagnosis not present

## 2020-02-13 DIAGNOSIS — Z951 Presence of aortocoronary bypass graft: Secondary | ICD-10-CM

## 2020-02-13 DIAGNOSIS — I2510 Atherosclerotic heart disease of native coronary artery without angina pectoris: Secondary | ICD-10-CM

## 2020-02-13 DIAGNOSIS — E782 Mixed hyperlipidemia: Secondary | ICD-10-CM | POA: Diagnosis not present

## 2020-02-13 DIAGNOSIS — I9789 Other postprocedural complications and disorders of the circulatory system, not elsewhere classified: Secondary | ICD-10-CM | POA: Diagnosis not present

## 2020-02-13 DIAGNOSIS — I350 Nonrheumatic aortic (valve) stenosis: Secondary | ICD-10-CM

## 2020-02-13 DIAGNOSIS — Z79899 Other long term (current) drug therapy: Secondary | ICD-10-CM

## 2020-02-13 DIAGNOSIS — I4891 Unspecified atrial fibrillation: Secondary | ICD-10-CM | POA: Diagnosis not present

## 2020-02-13 LAB — CBC
HCT: 39.8 % (ref 39.0–52.0)
Hemoglobin: 12.6 g/dL — ABNORMAL LOW (ref 13.0–17.0)
MCH: 31.2 pg (ref 26.0–34.0)
MCHC: 31.7 g/dL (ref 30.0–36.0)
MCV: 98.5 fL (ref 80.0–100.0)
Platelets: 356 10*3/uL (ref 150–400)
RBC: 4.04 MIL/uL — ABNORMAL LOW (ref 4.22–5.81)
RDW: 13.2 % (ref 11.5–15.5)
WBC: 8.8 10*3/uL (ref 4.0–10.5)
nRBC: 0 % (ref 0.0–0.2)

## 2020-02-13 LAB — BASIC METABOLIC PANEL
Anion gap: 11 (ref 5–15)
BUN: 10 mg/dL (ref 8–23)
CO2: 28 mmol/L (ref 22–32)
Calcium: 8.8 mg/dL — ABNORMAL LOW (ref 8.9–10.3)
Chloride: 101 mmol/L (ref 98–111)
Creatinine, Ser: 0.9 mg/dL (ref 0.61–1.24)
GFR, Estimated: >60 mL/min (ref >60)
Glucose, Bld: 109 mg/dL — ABNORMAL HIGH (ref 70–99)
Potassium: 3.3 mmol/L — ABNORMAL LOW (ref 3.5–5.1)
Sodium: 140 mmol/L (ref 135–145)

## 2020-02-13 MED ORDER — APIXABAN 5 MG PO TABS: 5.0000 mg | ORAL_TABLET | Freq: Two times a day (BID) | ORAL | 4 refills | Status: DC

## 2020-02-13 MED ORDER — AMIODARONE HCL 200 MG PO TABS: 200.0000 mg | ORAL_TABLET | Freq: Every day | ORAL | Status: DC

## 2020-02-13 NOTE — Patient Instructions (Signed)
Medication Instructions:  Reduce Amiodarone to 200mg  daily.   Labwork: CBC and BMET today.   Testing/Procedures: None  Follow-Up: Keep scheduled follow-up with Dr. Domenic Polite in 02/2020.  Any Other Special Instructions Will Be Listed Below (If Applicable).     If you need a refill on your cardiac medications before your next appointment, please call your pharmacy.

## 2020-02-13 NOTE — Progress Notes (Signed)
Cardiology Office Note    Date:  02/13/2020   ID:  Sean Villarreal, DOB [birth year], MRN [id number]  PCP:  Rory Percy, MD  Cardiologist: Rozann Lesches, MD    Chief Complaint  Patient presents with  . Hospitalization Follow-up    S/p CABG    History of Present Illness:    Sean Villarreal is a 80 y.o. male with past medical history of CAD (s/p DES to LAD and LCx in 04/2012, PTCA to ostial LCx in 03/2016 due to ISR, DES to ostial LCx in 09/2016 due to ISR, patent stents by repeat cath in 03/2018), mild AS, HTN, HLD and Type 2 DM who presents to the office today for hospital follow-up from recent CABG.   He most recently presented to [facility] ED on 01/20/2020 for evaluation of chest pain which radiated into his left arm and resembled his prior anginal symptoms. Troponin values were negative but given his presenting symptoms and cardiac history, he was sent to [facility] for a cardiac catheterization. This showed severe stenosis along the LAD, LCx and 40 to 50% distal left main disease. Given his ostial to proximal in-stent restenosis in the LCx and proximal LAD stenosis, CT Surgery consult was recommended. Ultimately underwent CABG by Dr. Roxan Hockey on 01/24/2020 with LIMA-LAD and SVG-OM. He did have some postoperative atrial fibrillation and required IV Amiodarone which was transitioned to 400 mg twice daily at the time of discharge with instructions to reduce to 200mg  BID in 7 days then 200mg  daily. Given he continued to have intermittent episodes of atrial fibrillation during admission, he was started on Eliquis for anticoagulation. Was discharged home on 01/31/2020 with Amiodarone, Eliquis 5mg  BID, ASA 81mg  daily, Atorvastatin 80mg  daily, Coreg 25mg  BID, Lisinopril 40mg  daily and PRN SL NTG.   He did call the office on 02/08/2020 reporting weight gain and associated lower extremity edema. Was instructed to take Lasix 40 mg daily over the weekend then resume 20 mg daily (had been  discontinued on his discharge summary).  In talking with the patient and his wife today, he reports overall doing well since his hospitalization. Still having sternal pain and some discomfort in his back but this continues to improve. No recent orthopnea or PND. His lower extremity edema improved following resumption of Lasix and his weight declined by approximately 10 lbs.    Past Medical History:  Diagnosis Date  . Arthritis   . Basal cell carcinoma   . Coronary artery disease    a. DES to LAD and LCx April 2014 b. PTCA ostial circumflex 03/2016 due to ISR c.  9/18 PCI/DESx2 osital Lcx (ISR), p/mLCx  d. 01/24/2020 with LIMA-LAD and SVG-OM.  [name] Dyslipidemia   . Essential hypertension   . GERD (gastroesophageal reflux disease)   . Hematuria   . History of blood transfusion 09/2012  . History of kidney stones   . NSTEMI (non-ST elevated myocardial infarction) ([place]) 08/2012  . Type II diabetes mellitus ([place])     Past Surgical History:  Procedure Laterality Date  . BASAL CELL CARCINOMA EXCISION     "right cheek; both shoulders"  . CARDIAC CATHETERIZATION    . CATARACT EXTRACTION W/ INTRAOCULAR LENS  IMPLANT, BILATERAL Bilateral   . COLONOSCOPY N/A 03/19/2016   Procedure: COLONOSCOPY;  Surgeon: Rogene Houston, MD;  [facility];  Service: Endoscopy;  Laterality: N/A;  9:15  . CORONARY ARTERY BYPASS GRAFT N/A 01/24/2020   Procedure: CORONARY ARTERY BYPASS GRAFTING (CABG), ON PUMP,  TIMES TWO, USING LEFT INTERNAL MAMMARY ARTERY AND ENDOSCOPICALLY HARVESTED RIGHT GREATER SAPHENOUS VEIN;  Surgeon: Melrose Nakayama, MD;  Location: [place];  Service: Open Heart Surgery;  Laterality: N/A;  . CORONARY BALLOON ANGIOPLASTY N/A 04/15/2016   Procedure: Coronary Balloon Angioplasty;  Surgeon: Peter M Martinique, MD;  Location: [place] CV LAB;  Service: Cardiovascular;  Laterality: N/A;  . CORONARY STENT INTERVENTION N/A 10/11/2016   Procedure: CORONARY STENT INTERVENTION;  Surgeon:  Jettie Booze, MD;  Location: [place] CV LAB;  Service: Cardiovascular;  Laterality: N/A;  . CYSTOSCOPY W/ URETEROSCOPY W/ LITHOTRIPSY  10/2012   Archie Endo 11/10/2012  . CYSTOSCOPY WITH STENT PLACEMENT  08/2012; 09/2012  . EYE SURGERY Left ~ 02/2016   "for crinkled lens"  . LEFT HEART CATH AND CORONARY ANGIOGRAPHY N/A 04/15/2016   Procedure: Left Heart Cath and Coronary Angiography;  Surgeon: Peter M Martinique, MD;  Location: [place] [facility];  Service: Cardiovascular;  Laterality: N/A;  . LEFT HEART CATH AND CORONARY ANGIOGRAPHY N/A 10/11/2016   Procedure: LEFT HEART CATH AND CORONARY ANGIOGRAPHY;  Surgeon: Jettie Booze, MD;  Location: [place] CV LAB;  Service: Cardiovascular;  Laterality: N/A;  . LEFT HEART CATH AND CORONARY ANGIOGRAPHY N/A 03/31/2018   Procedure: LEFT HEART CATH AND CORONARY ANGIOGRAPHY;  Surgeon: Troy Sine, MD;  Location: [place] [facility];  Service: Cardiovascular;  Laterality: N/A;  . LEFT HEART CATH AND CORONARY ANGIOGRAPHY N/A 01/21/2020   Procedure: LEFT HEART CATH AND CORONARY ANGIOGRAPHY;  Surgeon: Belva Crome, MD;  Location: [place] CV LAB;  Service: Cardiovascular;  Laterality: N/A;  . LEFT HEART CATHETERIZATION WITH CORONARY ANGIOGRAM N/A 05/05/2012   Procedure: LEFT HEART CATHETERIZATION WITH CORONARY ANGIOGRAM;  Surgeon: Burnell Blanks, MD;  Location: [facility] CATH LAB;  Service: Cardiovascular;  Laterality: N/A;  . LEFT HEART CATHETERIZATION WITH CORONARY ANGIOGRAM N/A 05/15/2012   Procedure: LEFT HEART CATHETERIZATION WITH CORONARY ANGIOGRAM;  Surgeon: Burnell Blanks, MD;  Location: [facility];  Service: Cardiovascular;  Laterality: N/A;  . TEE WITHOUT CARDIOVERSION N/A 01/24/2020   Procedure: TRANSESOPHAGEAL ECHOCARDIOGRAM (TEE);  Surgeon: Melrose Nakayama, MD;  Location: [place];  Service: Open Heart Surgery;  Laterality: N/A;  . TONSILLECTOMY  1947    Current Medications: Outpatient Medications Prior to Visit   Medication Sig Dispense Refill  . aspirin 81 MG tablet Take 81 mg by mouth daily.    [name] atorvastatin (LIPITOR) 80 MG tablet Take 80 mg by mouth at bedtime.    . carvedilol (COREG) 25 MG tablet Take 1 tablet (25 mg total) by mouth 2 (two) times daily with a meal. 60 tablet 11  . furosemide (LASIX) 20 MG tablet Take 1 tablet (20 mg total) by mouth daily.    [name] lisinopril (ZESTRIL) 40 MG tablet Take 40 mg by mouth daily.    . metFORMIN (GLUCOPHAGE) 1000 MG tablet Take 1,000 mg by mouth 2 (two) times daily with a meal.    . nitroGLYCERIN (NITROSTAT) 0.4 MG SL tablet Place 0.4 mg under the tongue every 5 (five) minutes as needed for chest pain.    [name] psyllium (METAMUCIL SMOOTH TEXTURE) 28 % packet Take 1 packet by mouth 2 (two) times daily.    . tamsulosin (FLOMAX) 0.4 MG CAPS capsule Take 0.4 mg by mouth.    . traMADol (ULTRAM) 50 MG tablet Take by mouth every 6 (six) hours as needed.    [name] amiodarone (PACERONE) 200 MG tablet Take 200 mg by mouth  2 (two) times daily.    [name] apixaban (ELIQUIS) 5 MG TABS tablet Take 5 mg by mouth 2 (two) times daily.    [name] acetaminophen (TYLENOL) 500 MG tablet Take 2 tablets (1,000 mg total) by mouth every 6 (six) hours as needed. 30 tablet 0  . amiodarone (PACERONE) 200 MG tablet Take 2 tablets (400 mg total) by mouth 2 (two) times daily. X 7 days, then decrease to 200 mg BID x 7 days, then decrease to 200 mg daily 90 tablet 0  . apixaban (ELIQUIS) 5 MG TABS tablet Take 1 tablet (5 mg total) by mouth 2 (two) times daily. 60 tablet 3  . glimepiride (AMARYL) 1 MG tablet Take 1 mg by mouth daily with breakfast.     . ketoconazole (NIZORAL) 2 % cream Apply 1 application topically 2 (two) times daily as needed (apply to face as needed (uses during the winter)). For dry skin    . lisinopril (PRINIVIL,ZESTRIL) 40 MG tablet Take 1 tablet (40 mg total) by mouth daily. 30 tablet 3  . metFORMIN (GLUCOPHAGE) 1000 MG tablet Take 1 tablet (1,000 mg total) by mouth 2 (two) times daily.     . nitroGLYCERIN (NITROSTAT) 0.4 MG SL tablet DISSOLVE 1 TABLET UNDER THE TONGUE EVERY 5 MINUTES AS  NEEDED FOR CHEST PAIN. MAX  OF 3 TABLETS IN 15 MINUTES. CALL 911 IF PAIN PERSISTS. (Patient taking differently: Place 0.4 mg under the tongue every 5 (five) minutes as needed.) 100 tablet 3  . potassium chloride SA (KLOR-CON) 20 MEQ tablet Take 0.5 tablets (10 mEq total) by mouth daily.    . psyllium (METAMUCIL SMOOTH TEXTURE) 28 % packet Take 1 packet by mouth at bedtime.    . tamsulosin (FLOMAX) 0.4 MG CAPS capsule Take 0.4 mg by mouth 2 (two) times daily.     . traMADol (ULTRAM) 50 MG tablet Take 1-2 tablets (50-100 mg total) by mouth every 4 (four) hours as needed for moderate pain. 30 tablet 0   No facility-administered medications prior to visit.     Allergies:   Contrast media [iodinated diagnostic agents] and Penicillins   Social History   Socioeconomic History  . Marital status: Married    Spouse name: Not on file  . Number of children: Not on file  . Years of education: Not on file  . Highest education level: Not on file  Occupational History  . Not on file  Tobacco Use  . Smoking status: Former Smoker    Packs/day: 0.75    Years: 30.00    Pack years: 22.50    Types: Cigarettes    Start date: 01/25/1982    Quit date: 11/05/1988    Years since quitting: 31.2  . Smokeless tobacco: Never Used  Vaping Use  . Vaping Use: Never used  Substance and Sexual Activity  . Alcohol use: Not Currently    Alcohol/week: 0.0 standard drinks  . Drug use: No  . Sexual activity: Not Currently  Other Topics Concern  . Not on file  Social History Narrative  . Not on file   Social Determinants of Health   Financial Resource Strain: Not on file  Food Insecurity: Not on file  Transportation Needs: Not on file  Physical Activity: Not on file  Stress: Not on file  Social Connections: Not on file     Family History:  The patient's family history includes Heart attack in his father;  Heart disease in his father and mother; Heart failure in his mother.  Review of Systems:   Please see the history of present illness.     General:  No chills, fever, night sweats or weight changes.  Cardiovascular:  No chest pain, dyspnea on exertion, orthopnea, palpitations, paroxysmal nocturnal dyspnea. Positive for edema (improved).  Dermatological: No rash, lesions/masses Respiratory: No cough, dyspnea Urologic: No hematuria, dysuria Abdominal:   No nausea, vomiting, diarrhea, bright red blood per rectum, melena, or hematemesis Neurologic:  No visual changes, wkns, changes in mental status. All other systems reviewed and are otherwise negative except as noted above.   Physical Exam:    VS:  BP 128/64   Pulse 83   Ht 5\' 8"  (1.727 m)   Wt 202 lb (91.6 kg)   SpO2 100%   BMI 30.71 kg/m    General: Well developed, well nourished,male appearing in no acute distress. Head: Normocephalic, atraumatic. Neck: No carotid bruits. JVD not elevated.  Lungs: Respirations regular and unlabored, without wheezes or rales.  Heart: Regular rate and rhythm. No S3 or S4.  2/6 SEM along RUSB.  Sternal incision appears well-healing without erythema or drainage.  Abdomen: Appears non-distended. No obvious abdominal masses. Msk:  Strength and tone appear normal for age. No obvious joint deformities or effusions. Extremities: No clubbing or cyanosis. No lower extremity edema.  Distal pedal pulses are 2+ bilaterally. Vein graft harvest site appears well healing without erythema or drainage. Ecchymosis noted.  Neuro: Alert and oriented X 3. Moves all extremities spontaneously. No focal deficits noted. Psych:  Responds to questions appropriately with a normal affect. Skin: No rashes or lesions noted  Wt Readings from Last 3 Encounters:  02/13/20 202 lb (91.6 kg)  01/31/20 203 lb 11.3 oz (92.4 kg)  12/11/19 212 lb (96.2 kg)     Studies/Labs Reviewed:   EKG:  EKG is ordered today.  The ekg ordered  today demonstrates NSR, HR 82 with known RBBB.   Recent Labs: 01/20/2020: ALT 23 01/25/2020: Magnesium 1.8 02/13/2020: BUN 10; Creatinine, Ser 0.90; Hemoglobin 12.6; Platelets 356; Potassium 3.3; Sodium 140   Lipid Panel    Component Value Date/Time   CHOL 132 01/22/2020 0637   TRIG 67 01/22/2020 0637   HDL 52 01/22/2020 0637   CHOLHDL 2.5 01/22/2020 0637   VLDL 13 01/22/2020 0637   LDLCALC 67 01/22/2020 0637    Additional studies/ records that were reviewed today include:   Echocardiogram: 12/2019 IMPRESSIONS    1. Left ventricular ejection fraction, by estimation, is 55 to 60%. The  left ventricle has normal function. The left ventricle has no regional  wall motion abnormalities. There is mild concentric left ventricular  hypertrophy. Left ventricular diastolic  parameters are consistent with Grade I diastolic dysfunction (impaired  relaxation).  2. Right ventricular systolic function is normal. The right ventricular  size is normal.  3. Left atrial size was mildly dilated.  4. The mitral valve is abnormal. Trivial mitral valve regurgitation.  Moderate mitral annular calcification.  5. The aortic valve is abnormal. There is moderate calcification of the  aortic valve. There is moderate thickening of the aortic valve. Aortic  valve regurgitation is not visualized. Mild aortic valve stenosis.   Cardiac Catheterization: 01/21/2020  Severe calcific coronary disease involving the left anterior descending, circumflex, and left main.  40 to 50% eccentric distal left main  Bulky eccentric 80% ostial to proximal LAD showing progression compared to 2020.  Moderate mid to distal diffuse LAD disease.  Diffuse ostial to proximal in-stent restenosis in circumflex which has 2  layers of stent.  Moderate mid to distal circumflex 50% narrowing.  Occlusion of ramus intermedius, very small and fills by left to left collaterals.  Dominant right coronary with luminal irregularities  but no high-grade obstruction.  Normal anterior wall motion.  Estimated > EF 50%.  Inferior wall not well seen.  LVEDP is normal.  RECOMMENDATIONS:   Discontinue Plavix  IV heparin  Recommend two-vessel coronary bypass surgery in the setting of significant proximal LAD disease and in-stent restenosis x3 of the ostial circumflex (2 layers of stent).  2D Doppler echocardiogram to fully assess LV function.    Assessment:    1. Coronary artery disease involving native coronary artery of native heart without angina pectoris   2. S/P CABG (coronary artery bypass graft)   3. Medication management   4. Postoperative atrial fibrillation (HCC)   5. Aortic valve stenosis, etiology of cardiac valve disease unspecified   6. Essential hypertension   7. Mixed hyperlipidemia      Plan:   In order of problems listed above:  1. CAD/CABG - He is now s/p CABG on 01/24/2020 with LIMA-LAD and SVG-OM. His sternal discomfort continues to improve and incisions appear well-healing on examination today. He did have some post-operative fluid overload and this has since improved. He is currently taking Lasix 20mg  daily but was on 20mg  every other day prior to admission. Will recheck a BMET today along with CBC given his post-operative anemia.  - Continue ASA 81mg  daily, Coreg 25mg  BID and Atorvastatin 80mg  daily.   2. Post-operative Atrial Fibrillation - He denies any recent palpitations since hospital discharge. Says he was symptomatic with his atrial fibrillation during admission. Currently on Amiodarone 200mg  BID. Will reduce dosing to 200mg  daily and anticipate this can be discontinued at his next visit in 02/2020 if no palpitations in the interm. Continue Coreg 25mg  BID for rate-control.  - He denies any evidence of active bleeding. Continue Eliquis 5mg  BID for anticoagulation. Would not anticipate long-term anticoagulation given this occurred in the post-operative setting but will continue for now  given his multiple episodes during admission.   3. Aortic Stenosis - Mild by most recent echocardiogram. Will continue to follow.   4. HTN - BP is well-controlled at 128/64 during today's visit. Continue current medication regimen.   5. HLD - Recent FLP showed LDL at 67. Continue Atorvastatin 80mg  daily.    Medication Adjustments/Labs and Tests Ordered: Current medicines are reviewed at length with the patient today.  Concerns regarding medicines are outlined above.  Medication changes, Labs and Tests ordered today are listed in the Patient Instructions below. Patient Instructions  Medication Instructions:  Reduce Amiodarone to 200mg  daily.   Labwork: CBC and BMET today.   Testing/Procedures: None  Follow-Up: Keep scheduled follow-up with Dr. Domenic Polite in 02/2020.  Any Other Special Instructions Will Be Listed Below (If Applicable).     If you need a refill on your cardiac medications before your next appointment, please call your pharmacy.      Signed, Erma Heritage, PA-C  02/13/2020 8:00 PM    [facility] [place], [place] [address] Phone: [phone] Fax: [phone] ,s

## 2020-02-14 ENCOUNTER — Telehealth: Payer: Self-pay

## 2020-02-14 NOTE — Telephone Encounter (Signed)
Patient was notified of results and verbalized understanding. Patient to only take lasix every other day. Patient also stated that he does have a potassium supplement at home and that he would start back taking it.

## 2020-02-14 NOTE — Telephone Encounter (Signed)
-----   Message from Erma Heritage, [place] sent at 02/13/2020  4:08 PM EST ----- Please let the patient know that his hemoglobin has significantly improved as compared to his post-operative values. This was previously down to 10.7 and is now up to 12.6 which is close to normal. His kidney function has overall remained stable but has started to trend upwards, therefore would recommend reducing his Lasix to every other day. His potassium is slightly low at 3.3. Does he have potassium supplementation at home and if so, is he currently taking this?

## 2020-02-14 NOTE — Progress Notes (Signed)
Order(s) created erroneously. Erroneous order ID: [id number]  Order canceled by: Milas Hock  Order cancel date/time: 02/14/2020 9:47 AM

## 2020-02-25 ENCOUNTER — Telehealth: Payer: Self-pay | Admitting: Cardiology

## 2020-02-25 ENCOUNTER — Ambulatory Visit (INDEPENDENT_AMBULATORY_CARE_PROVIDER_SITE_OTHER): Payer: Medicare Other | Admitting: Physician Assistant

## 2020-02-25 ENCOUNTER — Other Ambulatory Visit: Payer: Self-pay

## 2020-02-25 ENCOUNTER — Encounter: Payer: Self-pay | Admitting: Physician Assistant

## 2020-02-25 ENCOUNTER — Other Ambulatory Visit: Payer: Self-pay | Admitting: Thoracic Surgery (Cardiothoracic Vascular Surgery)

## 2020-02-25 ENCOUNTER — Telehealth: Payer: Self-pay

## 2020-02-25 VITALS — BP 182/90 | HR 97 | Ht 68.0 in | Wt 208.6 lb

## 2020-02-25 DIAGNOSIS — Z951 Presence of aortocoronary bypass graft: Secondary | ICD-10-CM

## 2020-02-25 DIAGNOSIS — E8779 Other fluid overload: Secondary | ICD-10-CM

## 2020-02-25 DIAGNOSIS — I2510 Atherosclerotic heart disease of native coronary artery without angina pectoris: Secondary | ICD-10-CM

## 2020-02-25 DIAGNOSIS — I10 Essential (primary) hypertension: Secondary | ICD-10-CM

## 2020-02-25 DIAGNOSIS — Z79899 Other long term (current) drug therapy: Secondary | ICD-10-CM

## 2020-02-25 MED ORDER — AMLODIPINE BESYLATE 10 MG PO TABS: ORAL_TABLET | ORAL | 3 refills | Status: DC

## 2020-02-25 NOTE — Telephone Encounter (Signed)
Please give pt a call concerning his BP -- it's going up and he's recently had bypass   Please call [phone]

## 2020-02-25 NOTE — Patient Instructions (Addendum)
WEIGH DAILY, every am, wearing the same amount of clothing Record weights, and bring it to office appointments Contact Rozann Lesches, MD for weight gain of 3 lbs in a day or 5 lbs in a week Limit sodium to 500 mg per meal, total 2000 mg per day Limit all liquids to 1.5-2 liters/quarts per day Medication Instructions:  RESTART Amlodipine 10 mg tablets:  Take 1.5 once a day tablets for 7 days then take ONLY 1 tablet once a day after.  INCREASE Lasix to 40 mg for 3 days then decrease to 20 mg daily.   *If you need a refill on your cardiac medications before your next appointment, please call your pharmacy*   Lab Work: BMET If you have labs (blood work) drawn today and your tests are completely normal, you will receive your results only by: [name] MyChart Message (if you have MyChart) OR . A paper copy in the mail If you have any lab test that is abnormal or we need to change your treatment, we will call you to review the results.   Testing/Procedures: None Today   Follow-Up: At [facility], you and your health needs are our priority.  As part of our continuing mission to provide you with exceptional heart care, we have created designated Provider Care Teams.  These Care Teams include your primary Cardiologist (physician) and Advanced Practice Providers (APPs -  Physician Assistants and Nurse Practitioners) who all work together to provide you with the care you need, when you need it.  We recommend signing up for the patient portal called "MyChart".  Sign up information is provided on this After Visit Summary.  MyChart is used to connect with patients for Virtual Visits (Telemedicine).  Patients are able to view lab/test results, encounter notes, upcoming appointments, etc.  Non-urgent messages can be sent to your provider as well.   To learn more about what you can do with MyChart, go to [email or web address].    Your next appointment:   Keep your follow up apt with Dr. Domenic Polite in  February.   Other Instructions None Today

## 2020-02-25 NOTE — Telephone Encounter (Signed)
Patient will see R.Barrett, PA-C today at 3 pm,he will bring his home BP monitor.

## 2020-02-25 NOTE — Progress Notes (Signed)
Cardiology Office Note   Date:  02/25/2020   ID:  Sean Villarreal, DOB 1940-02-23, MRN [id number]  PCP:  Rory Percy, MD Cardiologist:  Rozann Lesches, MD 12/11/2019 Electrphysiologist: None Demondre Aguas, PA-C  B. Ahmed Prima, [facility] 02/13/2020   History of Present Illness: Sean Villarreal is a 80 y.o. male with a history of CAD (s/p DES to LAD and LCx in 04/2012, PTCA to ostial LCx in 03/2016 due to ISR, DES to ostial LCx in 09/2016 due to ISR, patent stents by repeat cath in 03/2018), CP>>cath>>CABG 01/24/2020 w/ LIMA-LAD & AVG-OM1, mild AS, HTN, HLD and Type 2 DM.  Phone notes 01/31 re: high BP, appt made 01/19 office visit, Lasix had been restarted for LE edema, wt down 10 lbs. amio decreased to 200 mg qd, continue Eliquis for now, f/u SM 02/28 Admit 01/20/2020-01/31/2020 for USAP, s/p cath>>CABG w/ LIMA-LAD & SVG-OM1, had brief episodes Afib post-op >> Amio and Eliquis  Sean Villarreal presents for cardiology follow up  He is concerned because his BP has been running high. He is compliant with the BB, ACE at max doses. Lasix is 20 mg qod now. The 40 mg x 3 days helped the edema, then was 20 mg qd, now 20 mg qod. He is up 8 lbs today, but feels most of that is heavier clothes and shoes, plus lunch.   Today, he had fatigue and felt a little weak. He felt awful. No chest pain or SOB.  He feels the symptoms came from his abnormally high blood pressure.  Occasionally light-headed, not dizzy, no presyncope. No blurred vision. Had a HA yesterday, relieved by Tylenol.   Denies palpitations, does not think has had any atrial fib. HR has been normal on pulse-Ox.  Still w/ some chest soreness, but that is gradually getting better. No signs of infection, no fevers or chills.   No anginal chest pain.   COVID status: vaccinated, did not have COVID Past Medical History:  Diagnosis Date  . Arthritis   . Basal cell carcinoma   . Coronary artery disease    a. DES to LAD and LCx  April 2014 b. PTCA ostial circumflex 03/2016 due to ISR c.  9/18 PCI/DESx2 osital Lcx (ISR), p/mLCx  d. s/p CABG on 01/24/2020 with LIMA-LAD and SVG-OM.  [name] Dyslipidemia   . Essential hypertension   . GERD (gastroesophageal reflux disease)   . Hematuria   . History of blood transfusion 09/2012  . History of kidney stones   . NSTEMI (non-ST elevated myocardial infarction) ([place]) 08/2012  . Type II diabetes mellitus ([place])     Past Surgical History:  Procedure Laterality Date  . BASAL CELL CARCINOMA EXCISION     "right cheek; both shoulders"  . CARDIAC CATHETERIZATION    . CATARACT EXTRACTION W/ INTRAOCULAR LENS  IMPLANT, BILATERAL Bilateral   . COLONOSCOPY N/A 03/19/2016   Procedure: COLONOSCOPY;  Surgeon: Rogene Houston, MD;  [facility];  Service: Endoscopy;  Laterality: N/A;  9:15  . CORONARY ARTERY BYPASS GRAFT N/A 01/24/2020   Procedure: CORONARY ARTERY BYPASS GRAFTING (CABG), ON PUMP, TIMES TWO, USING LEFT INTERNAL MAMMARY ARTERY AND ENDOSCOPICALLY HARVESTED RIGHT GREATER SAPHENOUS VEIN;  Surgeon: Melrose Nakayama, MD;  Location: [place];  Service: Open Heart Surgery;  Laterality: N/A;  . CORONARY BALLOON ANGIOPLASTY N/A 04/15/2016   Procedure: Coronary Balloon Angioplasty;  Surgeon: Peter M Martinique, MD;  Location: [place] [facility];  Service: Cardiovascular;  Laterality: N/A;  .  CORONARY STENT INTERVENTION N/A 10/11/2016   Procedure: CORONARY STENT INTERVENTION;  Surgeon: Jettie Booze, MD;  Location: [place] [facility];  Service: Cardiovascular;  Laterality: N/A;  . CYSTOSCOPY W/ URETEROSCOPY W/ LITHOTRIPSY  10/2012   Archie Endo 11/10/2012  . CYSTOSCOPY WITH STENT PLACEMENT  08/2012; 09/2012  . EYE SURGERY Left ~ 02/2016   "for crinkled lens"  . LEFT HEART CATH AND CORONARY ANGIOGRAPHY N/A 04/15/2016   Procedure: Left Heart Cath and Coronary Angiography;  Surgeon: Peter M Martinique, MD;  Location: [place] [facility];  Service: Cardiovascular;  Laterality: N/A;  . LEFT  HEART CATH AND CORONARY ANGIOGRAPHY N/A 10/11/2016   Procedure: LEFT HEART CATH AND CORONARY ANGIOGRAPHY;  Surgeon: Jettie Booze, MD;  Location: [place] [facility];  Service: Cardiovascular;  Laterality: N/A;  . LEFT HEART CATH AND CORONARY ANGIOGRAPHY N/A 03/31/2018   Procedure: LEFT HEART CATH AND CORONARY ANGIOGRAPHY;  Surgeon: Troy Sine, MD;  Location: [place] [facility];  Service: Cardiovascular;  Laterality: N/A;  . LEFT HEART CATH AND CORONARY ANGIOGRAPHY N/A 01/21/2020   Procedure: LEFT HEART CATH AND CORONARY ANGIOGRAPHY;  Surgeon: Belva Crome, MD;  Location: [place] [facility];  Service: Cardiovascular;  Laterality: N/A;  . LEFT HEART CATHETERIZATION WITH CORONARY ANGIOGRAM N/A 05/05/2012   Procedure: LEFT HEART CATHETERIZATION WITH CORONARY ANGIOGRAM;  Surgeon: Burnell Blanks, MD;  Location: [facility] C[facility];  Service: Cardiovascular;  Laterality: N/A;  . LEFT HEART CATHETERIZATION WITH CORONARY ANGIOGRAM N/A 05/15/2012   Procedure: LEFT HEART CATHETERIZATION WITH CORONARY ANGIOGRAM;  Surgeon: Burnell Blanks, MD;  Location: [facility] CATH LAB;  Service: Cardiovascular;  Laterality: N/A;  . TEE WITHOUT CARDIOVERSION N/A 01/24/2020   Procedure: TRANSESOPHAGEAL ECHOCARDIOGRAM (TEE);  Surgeon: Melrose Nakayama, MD;  Location: [place];  Service: Open Heart Surgery;  Laterality: N/A;  . TONSILLECTOMY  1947    Current Outpatient Medications  Medication Sig Dispense Refill  . amiodarone (PACERONE) 200 MG tablet Take 1 tablet (200 mg total) by mouth daily.    [name] apixaban (ELIQUIS) 5 MG TABS tablet Take 1 tablet (5 mg total) by mouth 2 (two) times daily. 60 tablet 4  . aspirin 81 MG tablet Take 81 mg by mouth daily.    [name] atorvastatin (LIPITOR) 80 MG tablet Take 80 mg by mouth at bedtime.    . carvedilol (COREG) 25 MG tablet Take 1 tablet (25 mg total) by mouth 2 (two) times daily with a meal. 60 tablet 11  . furosemide (LASIX) 20 MG tablet Take 1 tablet (20 mg total) by mouth  daily.    [name] glimepiride (AMARYL) 1 MG tablet Take 1 mg by mouth daily with breakfast.    . lisinopril (ZESTRIL) 40 MG tablet Take 40 mg by mouth daily.    . metFORMIN (GLUCOPHAGE) 1000 MG tablet Take 1,000 mg by mouth 2 (two) times daily with a meal.    . nitroGLYCERIN (NITROSTAT) 0.4 MG SL tablet Place 0.4 mg under the tongue every 5 (five) minutes as needed for chest pain.    [name] Potassium Chloride Crys ER (KLOR-CON M20 PO) Take 10 mEq by mouth daily.    . psyllium (METAMUCIL SMOOTH TEXTURE) 28 % packet Take 1 packet by mouth 2 (two) times daily.    . tamsulosin (FLOMAX) 0.4 MG CAPS capsule Take 0.4 mg by mouth.    . traMADol (ULTRAM) 50 MG tablet Take by mouth every 6 (six) hours as needed.     No current facility-administered medications  for this visit.    Allergies:   Contrast media [iodinated diagnostic agents] and Penicillins    Social History:  The patient  reports that he quit smoking about 31 years ago. His smoking use included cigarettes. He started smoking about 38 years ago. He has a 22.50 pack-year smoking history. He has never used smokeless tobacco. He reports previous alcohol use. He reports that he does not use drugs.   Family History:  The patient's family history includes Heart attack in his father; Heart disease in his father and mother; Heart failure in his mother.  He indicated that his mother is deceased. He indicated that his father is deceased. He indicated that his child is alive. He indicated that the status of his neg hx is unknown. He indicated that his other is alive.    ROS:  Please see the history of present illness. All other systems are reviewed and negative.    PHYSICAL EXAM: VS:  BP (!) 182/90   Pulse 97   Ht 5\' 8"  (1.727 m)   Wt 208 lb 9.6 oz (94.6 kg)   SpO2 98%   BMI 31.72 kg/m  , BMI Body mass index is 31.72 kg/m. GEN: Well nourished, well developed, male in no acute distress HEENT: normal for age  Neck: JVD 8-9 cm, no carotid bruit, no  masses Cardiac: RRR; 2/6 murmur, no rubs, or gallops Respiratory: Decreased breath sounds bases with some rales bilaterally, normal work of breathing GI: soft, nontender, nondistended, + BS MS: no deformity or atrophy; no edema; distal pulses are 2+ in all 4 extremities  Skin: warm and dry, no rash; all incisions/wounds are healing well with no signs of infection Neuro:  Strength and sensation are intact Psych: euthymic mood, full affect   EKG:  EKG is not ordered today  ECHO: 01/21/2020 IMPRESSIONS  1. Left ventricular ejection fraction, by estimation, is 55 to 60%. The left ventricle has normal function. The left ventricle has no regional wall motion abnormalities. There is mild concentric left ventricular hypertrophy. Left ventricular diastolic  parameters are consistent with Grade I diastolic dysfunction (impaired relaxation).  2. Right ventricular systolic function is normal. The right ventricular size is normal.  3. Left atrial size was mildly dilated.  4. The mitral valve is abnormal. Trivial mitral valve regurgitation.  Moderate mitral annular calcification.  5. The aortic valve is abnormal. There is moderate calcification of the aortic valve. There is moderate thickening of the aortic valve. Aortic valve regurgitation is not visualized. Mild aortic valve stenosis.   Cardiac Catheterization: 01/21/2020  Severe calcific coronary disease involving the left anterior descending, circumflex, and left main.  40 to 50% eccentric distal left main  Bulky eccentric 80% ostial to proximal LAD showing progression compared to 2020. Moderate mid to distal diffuse LAD disease.  Diffuse ostial to proximal in-stent restenosis in circumflex which has 2 layers of stent. Moderate mid to distal circumflex 50% narrowing.  Occlusion of ramus intermedius, very small and fills by left to left collaterals.  Dominant right coronary with luminal irregularities but no high-grade  obstruction.  Normal anterior wall motion. Estimated >EF 50%. Inferior wall not well seen. LVEDP is normal.  RECOMMENDATIONS:   Discontinue Plavix  IV heparin  Recommend two-vessel coronary bypass surgery in the setting of significant proximal LAD disease and in-stent restenosis x3 of the ostial circumflex (2 layers of stent).  2D Doppler echocardiogram to fully assess LV function.  Recent Labs: 01/20/2020: ALT 23 01/25/2020: Magnesium 1.8 02/13/2020: BUN  10; Creatinine, Ser 0.90; Hemoglobin 12.6; Platelets 356; Potassium 3.3; Sodium 140  CBC    Component Value Date/Time   WBC 8.8 02/13/2020 1424   RBC 4.04 (L) 02/13/2020 1424   HGB 12.6 (L) 02/13/2020 1424   HCT 39.8 02/13/2020 1424   PLT 356 02/13/2020 1424   MCV 98.5 02/13/2020 1424   MCH 31.2 02/13/2020 1424   MCHC 31.7 02/13/2020 1424   RDW 13.2 02/13/2020 1424   LYMPHSABS 2.5 10/09/2016 1952   MONOABS 1.2 (H) 10/09/2016 1952   EOSABS 0.2 10/09/2016 1952   BASOSABS 0.0 10/09/2016 1952   CMP Latest Ref Rng & Units 02/13/2020 01/28/2020 01/27/2020  Glucose 70 - 99 mg/dL 109(H) 129(H) 126(H)  BUN 8 - 23 mg/dL 10 13 10   Creatinine 0.61 - 1.24 mg/dL 0.90 0.78 0.73  Sodium 135 - 145 mmol/L 140 138 135  Potassium 3.5 - 5.1 mmol/L 3.3(L) 3.8 3.9  Chloride 98 - 111 mmol/L 101 102 102  CO2 22 - 32 mmol/L 28 24 23   Calcium 8.9 - 10.3 mg/dL 8.8(L) 8.5(L) 8.4(L)  Total Protein 6.5 - 8.1 g/dL - - -  Total Bilirubin 0.3 - 1.2 mg/dL - - -  Alkaline Phos 38 - 126 U/L - - -  AST 15 - 41 U/L - - -  ALT 0 - 44 U/L - - -     Lipid Panel Lab Results  Component Value Date   CHOL 132 01/22/2020   HDL 52 01/22/2020   LDLCALC 67 01/22/2020   TRIG 67 01/22/2020   CHOLHDL 2.5 01/22/2020      Wt Readings from Last 3 Encounters:  02/25/20 208 lb 9.6 oz (94.6 kg)  02/13/20 202 lb (91.6 kg)  01/31/20 203 lb 11.3 oz (92.4 kg)     Other studies Reviewed: Additional studies/ records that were reviewed today include: Office  notes, hospital records and testing.  ASSESSMENT AND PLAN:  1.  CAD s/p CABG: -He is recovering well after bypass surgery. -Continue ASA, beta-blocker, statin, ACE inhibitor -Keep appointment tomorrow with Dr. Roxan Hockey  2.  Volume overload: -He has grade 1 diastolic dysfunction on his echocardiogram. -He was put on Lasix and lost 10 pounds, but has gained 6 with him back -He has some volume overload by exam -Increase Lasix to 40 mg daily for 3 days, then go back to 20 mg a day -Check a BMET today -Keep appointment with Dr. Roxan Hockey tomorrow, he will get a chest x-ray prior to that -He was given instructions on weights, sodium and liquids  3.  Hypertension: -His blood pressure cuff readings are very elevated, but correlate well with the readings we obtained in the office. -He was previously on amlodipine and tolerated it well -Restart amlodipine at 1/2 tablet daily for 7 days, then 1 tablet daily -Continue to monitor blood pressure and communicate readings via MyChart  Current medicines are reviewed at length with the patient today.  The patient has concerns regarding medicines.  The following changes have been made: increase Lasix dose temporarily, restart amlodipine  Labs/ tests ordered today include:   Orders Placed This Encounter  Procedures  . Basic Metabolic Panel (BMET)     Disposition:   FU with Rozann Lesches, MD  Signed, Rosaria Ferries, PA-C  02/25/2020 7:11 PM    [facility] Phone: [phone]; Fax: [phone]

## 2020-02-25 NOTE — Telephone Encounter (Signed)
Patient contacted the office to state that his blood pressure has been running high.  He could feel that it was elevated.  He stated that when he checked it this morning it was 199/109.  Took his medications and rechecked it again at 0900 it was 181/91 and then again at 1000 176/90.  He is s/p CABG x2 with Dr. Roxan Hockey on 01/24/20.  Advised that he needed to contact Dr. Myles Gip office for medication advise. He acknowledged receipt.

## 2020-02-25 NOTE — Telephone Encounter (Signed)
I reviewed the chart.  He was seen by Ms. Strader PA-C on January 19 at which point blood pressure was 128/64.  He is on lisinopril 40 mg daily and Coreg 25 mg twice daily.  Had been on Norvasc previously.  Please have him come in for nurse blood pressure check, bring home cuff to verify accuracy.  May want to start back on Norvasc if blood pressure truly is elevated, although would like to get him off amiodarone if that is the case.

## 2020-02-25 NOTE — Telephone Encounter (Signed)
Patient post CABG who had not been taking home BP readings decided to take BP yesterday. Recorded 200/90's. Today has recorded readings 181/91, 176/90,199/109   They only change to meds are he is taking amiodarne 200 mg qd now and he states he reduced his Lasix to 20 mg every other day vs qd   At last office visit 02/13/20 he was 128/64.

## 2020-02-26 ENCOUNTER — Other Ambulatory Visit: Payer: Self-pay

## 2020-02-26 ENCOUNTER — Ambulatory Visit
Admission: RE | Admit: 2020-02-26 | Discharge: 2020-02-26 | Disposition: A | Discharge status: Home / Self Care | Payer: Medicare Other | Source: Ambulatory Visit | Attending: Thoracic Surgery (Cardiothoracic Vascular Surgery) | Admitting: Thoracic Surgery (Cardiothoracic Vascular Surgery)

## 2020-02-26 ENCOUNTER — Other Ambulatory Visit (HOSPITAL_COMMUNITY)
Admission: RE | Admit: 2020-02-26 | Discharge: 2020-02-26 | Disposition: A | Discharge status: Home / Self Care | Payer: Medicare Other | Source: Ambulatory Visit | Attending: Physician Assistant | Admitting: Physician Assistant

## 2020-02-26 ENCOUNTER — Ambulatory Visit (INDEPENDENT_AMBULATORY_CARE_PROVIDER_SITE_OTHER): Payer: Self-pay | Admitting: Thoracic Surgery (Cardiothoracic Vascular Surgery)

## 2020-02-26 VITALS — BP 138/80 | HR 62 | Temp 97.6°F | Resp 20 | Ht 68.0 in | Wt 203.0 lb

## 2020-02-26 DIAGNOSIS — I2510 Atherosclerotic heart disease of native coronary artery without angina pectoris: Secondary | ICD-10-CM

## 2020-02-26 DIAGNOSIS — Z9889 Other specified postprocedural states: Secondary | ICD-10-CM | POA: Diagnosis not present

## 2020-02-26 DIAGNOSIS — Z951 Presence of aortocoronary bypass graft: Secondary | ICD-10-CM

## 2020-02-26 DIAGNOSIS — Z79899 Other long term (current) drug therapy: Secondary | ICD-10-CM | POA: Diagnosis not present

## 2020-02-26 LAB — BASIC METABOLIC PANEL
Anion gap: 12 (ref 5–15)
BUN: 10 mg/dL (ref 8–23)
CO2: 27 mmol/L (ref 22–32)
Calcium: 8.7 mg/dL — ABNORMAL LOW (ref 8.9–10.3)
Chloride: 101 mmol/L (ref 98–111)
Creatinine, Ser: 0.72 mg/dL (ref 0.61–1.24)
GFR, Estimated: >60 mL/min (ref >60)
Glucose, Bld: 150 mg/dL — ABNORMAL HIGH (ref 70–99)
Potassium: 3.1 mmol/L — ABNORMAL LOW (ref 3.5–5.1)
Sodium: 140 mmol/L (ref 135–145)

## 2020-02-26 NOTE — Progress Notes (Signed)
[address]       Villarreal,[place] [address]             [phone]     HPI: Sean Villarreal returns for a scheduled postoperative follow-up visit.  Sean Villarreal is a 80 year old man with a history of coronary disease with previous stents in 2014 and 2018, hypertension, hyperlipidemia, type 2 diabetes, and mild AS. He presented with unstable angina. At catheterization he was found to have moderate left main disease and severe disease in the proximal LAD and circumflex.  He underwent coronary bypass grafting x2 on 01/24/2020. His LAD was diffusely diseased. His postoperative course was notable for intermittent atrial fibrillation. He did go home on Eliquis and amiodarone.  Since his blood pressure had been running high. He was also having some swelling of his legs. Rhonda Barrett increased his Lasix as he was only on 20 mg every other day. He felt really poorly yesterday but feels much better today. He is having minimal discomfort and is not taking any narcotics. He has not had any recurrent angina.  Past Medical History:  Diagnosis Date  . Arthritis   . Basal cell carcinoma   . Coronary artery disease    a. DES to LAD and LCx April 2014 b. PTCA ostial circumflex 03/2016 due to ISR c.  9/18 PCI/DESx2 osital Lcx (ISR), p/mLCx  d. s/p CABG on 01/24/2020 with LIMA-LAD and SVG-OM.  [name] Dyslipidemia   . Essential hypertension   . GERD (gastroesophageal reflux disease)   . Hematuria   . History of blood transfusion 09/2012  . History of kidney stones   . NSTEMI (non-ST elevated myocardial infarction) ([place]) 08/2012  . Type II diabetes mellitus ([place])     Current Outpatient Medications  Medication Sig Dispense Refill  . amiodarone (PACERONE) 200 MG tablet Take 1 tablet (200 mg total) by mouth daily.    [name] amLODipine (NORVASC) 10 MG tablet Take 1 tablet once daily or as directed. 90 tablet 3  . apixaban (ELIQUIS) 5 MG TABS tablet Take 1 tablet (5 mg total) by mouth 2 (two) times  daily. 60 tablet 4  . aspirin 81 MG tablet Take 81 mg by mouth daily.    [name] atorvastatin (LIPITOR) 80 MG tablet Take 80 mg by mouth at bedtime.    . carvedilol (COREG) 25 MG tablet Take 1 tablet (25 mg total) by mouth 2 (two) times daily with a meal. 60 tablet 11  . furosemide (LASIX) 20 MG tablet Take 1 tablet (20 mg total) by mouth daily.    [name] glimepiride (AMARYL) 1 MG tablet Take 1 mg by mouth daily with breakfast.    . lisinopril (ZESTRIL) 40 MG tablet Take 40 mg by mouth daily.    . metFORMIN (GLUCOPHAGE) 1000 MG tablet Take 1,000 mg by mouth 2 (two) times daily with a meal.    . nitroGLYCERIN (NITROSTAT) 0.4 MG SL tablet Place 0.4 mg under the tongue every 5 (five) minutes as needed for chest pain.    [name] Potassium Chloride Crys ER (KLOR-CON M20 PO) Take 10 mEq by mouth daily.    . psyllium (METAMUCIL SMOOTH TEXTURE) 28 % packet Take 1 packet by mouth 2 (two) times daily.    . tamsulosin (FLOMAX) 0.4 MG CAPS capsule Take 0.4 mg by mouth.    . traMADol (ULTRAM) 50 MG tablet Take by mouth every 6 (six) hours as needed.     No current facility-administered medications for this visit.  Physical Exam BP 138/80   Pulse 62   Temp 97.6 F (36.4 C) (Skin)   Resp 20   Ht 5\' 8"  (1.727 m)   Wt 203 lb (92.1 kg)   SpO2 95% Comment: RA  BMI 30.36 kg/m  80 year old man in no acute distress Alert and oriented x3 with no focal deficits Lungs clear with equal breath sounds bilaterally Cardiac regular rate and rhythm no rub or murmur Sternum stable, incision healing well Leg incision well-healed, ecchymosis in the thigh Trace pretibial edema  Diagnostic Tests: CHEST - 2 VIEW  COMPARISON:  01/28/2020  FINDINGS: Lungs are clear. Possible trace left pleural effusion, improved. No pneumothorax.  The heart is normal in size. Postsurgical changes related to prior CABG.  Median sternotomy.  IMPRESSION: Possible trace left pleural effusion, improved.   Electronically Signed    By: Julian Hy M.D.   On: 02/26/2020 10:17 I personally reviewed the chest x-ray. Shows postoperative changes. No concerning findings.  Impression: Sean Villarreal is a 80 year old man with multiple cardiac risk factors and known coronary disease. He presented with unstable angina and was found to have moderate left main disease and severe disease in the proximal LAD and circumflex. He underwent coronary bypass grafting x2 on 01/24/2020. Postoperatively he had some atrial fibrillation but overall did well.  Status post coronary bypass grafting-no recurrent angina. Seems to be progressing at a normal rate.  Postoperative atrial fibrillation-regular rhythm today. He is on low-dose amiodarone and Eliquis. Will defer decision on discontinuing those medications to Dr. Domenic Polite. He has a follow-up with him later this month.  He may begin driving. Appropriate precautions were discussed. He should not lift anything over 10 pounds for another 2 weeks and nothing over 20 pounds for another 4 weeks. Beyond that his activities are unrestricted.  Plan: Follow-up with Dr. Domenic Polite as scheduled I will be happy to see Sean Villarreal back anytime in the future if I can be of any further assistance with his care.  Melrose Nakayama, MD [facility] [phone]

## 2020-03-05 DIAGNOSIS — L570 Actinic keratosis: Secondary | ICD-10-CM | POA: Diagnosis not present

## 2020-03-05 DIAGNOSIS — D1801 Hemangioma of skin and subcutaneous tissue: Secondary | ICD-10-CM | POA: Diagnosis not present

## 2020-03-05 DIAGNOSIS — L219 Seborrheic dermatitis, unspecified: Secondary | ICD-10-CM | POA: Diagnosis not present

## 2020-03-05 DIAGNOSIS — Z85828 Personal history of other malignant neoplasm of skin: Secondary | ICD-10-CM | POA: Diagnosis not present

## 2020-03-05 DIAGNOSIS — L821 Other seborrheic keratosis: Secondary | ICD-10-CM | POA: Diagnosis not present

## 2020-03-05 DIAGNOSIS — L905 Scar conditions and fibrosis of skin: Secondary | ICD-10-CM | POA: Diagnosis not present

## 2020-03-23 NOTE — Progress Notes (Signed)
Cardiology Office Note  Date: 03/24/2020   ID: Sean Villarreal, DOB Jan 13, 1941, MRN [id number]  PCP:  [facility]  Cardiologist:  Rozann Lesches, MD Electrophysiologist:  None   Chief Complaint  Patient presents with  . Cardiac follow-up    History of Present Illness: Sean Villarreal is a 80 y.o. male last seen in January by Ms. Barrett PA-C.  He is status post CABG in December 2021 as noted below.  Follow-up visit with Dr. Roxan Hockey noted in early February.  He states that he is doing reasonably well, still not back to baseline, but functional with ADLs, has been doing some work in his garage, also walking.  He was not able to start cardiac rehabilitation in [place].  Also tells with his wife has had recurrent breast cancer.  He did have postoperative atrial fibrillation after bypass surgery, has been on Eliquis and low-dose amiodarone since that time.  CHA2DS2-VASc score is 4.  He has no prior history of atrial fibrillation. He reports no palpitations and is in sinus rhythm today by ECG.  Norvasc was resumed at the last visit for better control of blood pressure.  Home systolic blood pressures have been in the 120s to 130s.  I reviewed his medications which are outlined below.  He has been on Lasix 20 mg daily, we are increasing his potassium supplement, noted to be hypokalemic by lab work in early February.  Past Medical History:  Diagnosis Date  . Arthritis   . Basal cell carcinoma   . Coronary artery disease    a. DES to LAD and LCx April 2014 b. PTCA ostial circumflex 03/2016 due to ISR c.  9/18 PCI/DESx2 osital Lcx (ISR), p/mLCx  d. s/p CABG on 01/24/2020 with LIMA-LAD and SVG-OM.  [name] Dyslipidemia   . Essential hypertension   . GERD (gastroesophageal reflux disease)   . Hematuria   . History of blood transfusion 09/2012  . History of kidney stones   . NSTEMI (non-ST elevated myocardial infarction) ([place]) 08/2012  . Type II diabetes mellitus  ([place])     Past Surgical History:  Procedure Laterality Date  . BASAL CELL CARCINOMA EXCISION     "right cheek; both shoulders"  . CARDIAC CATHETERIZATION    . CATARACT EXTRACTION W/ INTRAOCULAR LENS  IMPLANT, BILATERAL Bilateral   . COLONOSCOPY N/A 03/19/2016   Procedure: COLONOSCOPY;  Surgeon: Rogene Houston, MD;  [facility];  Service: Endoscopy;  Laterality: N/A;  9:15  . CORONARY ARTERY BYPASS GRAFT N/A 01/24/2020   Procedure: CORONARY ARTERY BYPASS GRAFTING (CABG), ON PUMP, TIMES TWO, USING LEFT INTERNAL MAMMARY ARTERY AND ENDOSCOPICALLY HARVESTED RIGHT GREATER SAPHENOUS VEIN;  Surgeon: Melrose Nakayama, MD;  Location: [facility];  Service: Open Heart Surgery;  Laterality: N/A;  . CORONARY BALLOON ANGIOPLASTY N/A 04/15/2016   Procedure: Coronary Balloon Angioplasty;  Surgeon: Peter M Martinique, MD;  Location: [facility];  Service: Cardiovascular;  Laterality: N/A;  . CORONARY STENT INTERVENTION N/A 10/11/2016   Procedure: CORONARY STENT INTERVENTION;  Surgeon: Jettie Booze, MD;  Location: [facility];  Service: Cardiovascular;  Laterality: N/A;  . CYSTOSCOPY W/ URETEROSCOPY W/ LITHOTRIPSY  10/2012   Ar[facility] Endo 11/10/2012  . CYSTOSCOPY WITH STENT PLACEMENT  08/2012; 09/2012  . EYE SURGERY Left ~ 02/2016   "for crinkled lens"  . LEFT HEART CATH AND CORONARY ANGIOGRAPHY N/A 04/15/2016   Procedure: Left Heart Cath and Coronary Angiography;  Surgeon: Peter M Martinique, MD;  Location:  [facility] LAB;  Service: Cardiovascular;  Laterality: N/A;  . LEFT HEART CATH AND CORONARY ANGIOGRAPHY N/A 10/11/2016   Procedure: LEFT HEART CATH AND CORONARY ANGIOGRAPHY;  Surgeon: Jettie Booze, MD;  Location: [facility];  Service: Cardiovascular;  Laterality: N/A;  . LEFT HEART CATH AND CORONARY ANGIOGRAPHY N/A 03/31/2018   Procedure: LEFT HEART CATH AND CORONARY ANGIOGRAPHY;  Surgeon: Troy Sine, MD;  Location: [facility] LAB;  Service: Cardiovascular;   Laterality: N/A;  . LEFT HEART CATH AND CORONARY ANGIOGRAPHY N/A 01/21/2020   Procedure: LEFT HEART CATH AND CORONARY ANGIOGRAPHY;  Surgeon: Belva Crome, MD;  Location: [place] [facility];  Service: Cardiovascular;  Laterality: N/A;  . LEFT HEART CATHETERIZATION WITH CORONARY ANGIOGRAM N/A 05/05/2012   Procedure: LEFT HEART CATHETERIZATION WITH CORONARY ANGIOGRAM;  Surgeon: Burnell Blanks, MD;  Location: [facility] C[facility] LAB;  Service: Cardiovascular;  Laterality: N/A;  . LEFT HEART CATHETERIZATION WITH CORONARY ANGIOGRAM N/A 05/15/2012   Procedure: LEFT HEART CATHETERIZATION WITH CORONARY ANGIOGRAM;  Surgeon: Burnell Blanks, MD;  Location: [facility] CATH LAB;  Service: Cardiovascular;  Laterality: N/A;  . TEE WITHOUT CARDIOVERSION N/A 01/24/2020   Procedure: TRANSESOPHAGEAL ECHOCARDIOGRAM (TEE);  Surgeon: Melrose Nakayama, MD;  Location: [facility];  Service: Open Heart Surgery;  Laterality: N/A;  . TONSILLECTOMY  1948    Current Outpatient Medications  Medication Sig Dispense Refill  . amLODipine (NORVASC) 10 MG tablet Take 1 tablet once daily or as directed. 90 tablet 3  . aspirin 81 MG tablet Take 81 mg by mouth daily.    [name] atorvastatin (LIPITOR) 80 MG tablet Take 80 mg by mouth at bedtime.    . carvedilol (COREG) 25 MG tablet Take 1 tablet (25 mg total) by mouth 2 (two) times daily with a meal. 60 tablet 11  . glimepiride (AMARYL) 1 MG tablet Take 1 mg by mouth daily with breakfast.    . lisinopril (ZESTRIL) 40 MG tablet Take 40 mg by mouth daily.    . metFORMIN (GLUCOPHAGE) 1000 MG tablet Take 1,000 mg by mouth 2 (two) times daily with a meal.    . nitroGLYCERIN (NITROSTAT) 0.4 MG SL tablet Place 0.4 mg under the tongue every 5 (five) minutes as needed for chest pain.    . potassium chloride SA (KLOR-CON M20) 20 MEQ tablet Take 1 tablet (20 mEq total) by mouth daily. 90 tablet 1  . psyllium (METAMUCIL SMOOTH TEXTURE) 28 % packet Take 1 packet by mouth 2 (two) times daily.    .  tamsulosin (FLOMAX) 0.4 MG CAPS capsule Take 0.4 mg by mouth.    . furosemide (LASIX) 20 MG tablet Take 1 tablet (20 mg total) by mouth daily. 90 tablet 1  . traMADol (ULTRAM) 50 MG tablet Take by mouth every 6 (six) hours as needed.     No current facility-administered medications for this visit.   Allergies:  Contrast media [iodinated diagnostic agents] and Penicillins  ROS: No palpitations or syncope.  Physical Exam: VS:  BP (!) 148/82   Pulse 74   Ht 5\' 8"  (1.727 m)   Wt 211 lb 12.8 oz (96.1 kg)   SpO2 99%   BMI 32.20 kg/m , BMI Body mass index is 32.2 kg/m.  Wt Readings from Last 3 Encounters:  03/24/20 211 lb 12.8 oz (96.1 kg)  02/26/20 203 lb (92.1 kg)  02/25/20 208 lb 9.6 oz (94.6 kg)    General: Elderly male, appears comfortable at rest. HEENT: Conjunctiva and  lids normal, wearing a mask. Neck: Supple, no elevated JVP or carotid bruits, no thyromegaly. Thorax: Well-healed sternal incision. Lungs: Clear to auscultation, nonlabored breathing at rest. Cardiac: Regular rate and rhythm, no S3, 3/6 systolic murmur, no pericardial rub. Abdomen: Soft, nontender, bowel sounds present. Extremities: No pitting edema.  ECG:  An ECG dated 02/13/2019 was personally reviewed today and demonstrated:  Sinus rhythm with right bundle branch block, left anterior fascicular block versus old inferior infarct pattern.  Recent Labwork: 01/20/2020: ALT 23; AST 20 01/25/2020: Magnesium 1.8 02/13/2020: Hemoglobin 12.6; Platelets 356 02/26/2020: BUN 10; Creatinine, Ser 0.72; Potassium 3.1; Sodium 140     Component Value Date/Time   CHOL 132 01/22/2020 0637   TRIG 67 01/22/2020 0637   HDL 52 01/22/2020 0637   CHOLHDL 2.5 01/22/2020 0637   VLDL 13 01/22/2020 0637   LDLCALC 67 01/22/2020 0637    Other Studies Reviewed Today:  Cardiac catheterization 01/21/2020:  Severe calcific coronary disease involving the left anterior descending, circumflex, and left main.  40 to 50% eccentric  distal left main  Bulky eccentric 80% ostial to proximal LAD showing progression compared to 2020.  Moderate mid to distal diffuse LAD disease.  Diffuse ostial to proximal in-stent restenosis in circumflex which has 2 layers of stent.  Moderate mid to distal circumflex 50% narrowing.  Occlusion of ramus intermedius, very small and fills by left to left collaterals.  Dominant right coronary with luminal irregularities but no high-grade obstruction.  Normal anterior wall motion.  Estimated > EF 50%.  Inferior wall not well seen.  LVEDP is normal.  Echocardiogram 01/21/2020: 1. Left ventricular ejection fraction, by estimation, is 55 to 60%. The  left ventricle has normal function. The left ventricle has no regional  wall motion abnormalities. There is mild concentric left ventricular  hypertrophy. Left ventricular diastolic  parameters are consistent with Grade I diastolic dysfunction (impaired  relaxation).  2. Right ventricular systolic function is normal. The right ventricular  size is normal.  3. Left atrial size was mildly dilated.  4. The mitral valve is abnormal. Trivial mitral valve regurgitation.  Moderate mitral annular calcification.  5. The aortic valve is abnormal. There is moderate calcification of the  aortic valve. There is moderate thickening of the aortic valve. Aortic  valve regurgitation is not visualized. Mild aortic valve stenosis.   Carotid Dopplers 01/22/2020: Summary:  Right Carotid: Velocities in the right ICA are consistent with a 1-39%  stenosis.   Left Carotid: Velocities in the left ICA are consistent with a 1-39%  stenosis.  Vertebrals: Bilateral vertebral arteries demonstrate antegrade flow.  Subclavians: Normal flow hemodynamics were seen in bilateral subclavian        arteries.   Assessment and Plan:  1.  Multivessel CAD status post CABG in December 2021.  No active angina at this time.  Interval follow-up noted with Dr.  Roxan Hockey.  Continue aspirin, Lipitor, Coreg, Norvasc, lisinopril, and as needed nitroglycerin.  Encouraged continued walking plan.  He was not able to participate in cardiac rehabilitation in [place].  2.  Mild calcific aortic stenosis, asymptomatic at this time.  Echocardiogram report from December 2021 reviewed.  3.  Mild nonobstructive carotid artery disease.  Continue aspirin and statin.  4.  Postoperative atrial fibrillation.  There have been no obvious recurrences.  ECG reviewed today showing sinus rhythm with right bundle branch block.  For now we will stop Eliquis and amiodarone.  If he has recurrent atrial fibrillation, anticoagulation will need to be resumed  with CHA2DS2-VASc score of 4.  5.  Mixed hyperlipidemia, tolerating high-dose Lipitor.  LDL 67.  6.  Postoperative volume overload, LVEF 55 to 60% with mild diastolic dysfunction and normal RV contraction.  He is tolerating Lasix 20 mg daily, increase KCl to 20 mEq daily.  Recheck BMET in 2 weeks.  Medication Adjustments/Labs and Tests Ordered: Current medicines are reviewed at length with the patient today.  Concerns regarding medicines are outlined above.   Tests Ordered: Orders Placed This Encounter  Procedures  . Basic Metabolic Panel (BMET)  . EKG 12-Lead    Medication Changes: Meds ordered this encounter  Medications  . furosemide (LASIX) 20 MG tablet    Sig: Take 1 tablet (20 mg total) by mouth daily.    Dispense:  90 tablet    Refill:  1  . potassium chloride SA (KLOR-CON M20) 20 MEQ tablet    Sig: Take 1 tablet (20 mEq total) by mouth daily.    Dispense:  90 tablet    Refill:  1    Disposition:  Follow up 6 months in the [place] office.  Signed, Satira Sark, MD, [facility] 03/24/2020 9:12 AM    [facility] [place], [place], [place] [address] Phone: [phone]; Fax: [phone]

## 2020-03-24 ENCOUNTER — Ambulatory Visit (INDEPENDENT_AMBULATORY_CARE_PROVIDER_SITE_OTHER): Payer: Medicare Other | Admitting: Cardiology

## 2020-03-24 ENCOUNTER — Encounter: Payer: Self-pay | Admitting: Cardiology

## 2020-03-24 VITALS — BP 148/82 | HR 74 | Ht 68.0 in | Wt 211.8 lb

## 2020-03-24 DIAGNOSIS — I2510 Atherosclerotic heart disease of native coronary artery without angina pectoris: Secondary | ICD-10-CM | POA: Diagnosis not present

## 2020-03-24 DIAGNOSIS — I4891 Unspecified atrial fibrillation: Secondary | ICD-10-CM

## 2020-03-24 DIAGNOSIS — Z951 Presence of aortocoronary bypass graft: Secondary | ICD-10-CM

## 2020-03-24 DIAGNOSIS — E782 Mixed hyperlipidemia: Secondary | ICD-10-CM

## 2020-03-24 DIAGNOSIS — I9789 Other postprocedural complications and disorders of the circulatory system, not elsewhere classified: Secondary | ICD-10-CM

## 2020-03-24 DIAGNOSIS — I25119 Atherosclerotic heart disease of native coronary artery with unspecified angina pectoris: Secondary | ICD-10-CM | POA: Diagnosis not present

## 2020-03-24 MED ORDER — FUROSEMIDE 20 MG PO TABS: 20.0000 mg | ORAL_TABLET | Freq: Every day | ORAL | 1 refills | Status: DC

## 2020-03-24 MED ORDER — FUROSEMIDE 20 MG PO TABS
20.0000 mg | ORAL_TABLET | Freq: Every day | ORAL | 1 refills | Status: DC
Start: 2020-03-24 — End: 2020-03-24

## 2020-03-24 MED ORDER — POTASSIUM CHLORIDE CRYS ER 20 MEQ PO TBCR: 20.0000 meq | EXTENDED_RELEASE_TABLET | Freq: Every day | ORAL | 1 refills | Status: DC

## 2020-03-24 NOTE — Patient Instructions (Signed)
Your physician recommends that you schedule a follow-up appointment in: Emily has recommended you make the following change in your medication:   STOP ELIQUIS AND AMIODARONE   INCREASE POTASSIUM 20 [place] physician recommends that you return for lab work in 2 WEEKS BMP  Thank you for choosing [facility]!!

## 2020-03-24 NOTE — Addendum Note (Signed)
Addended by: Julian Hy T on: 03/24/2020 09:23 AM   Modules accepted: Orders

## 2020-03-31 DIAGNOSIS — Z85828 Personal history of other malignant neoplasm of skin: Secondary | ICD-10-CM | POA: Diagnosis not present

## 2020-03-31 DIAGNOSIS — Z91041 Radiographic dye allergy status: Secondary | ICD-10-CM | POA: Diagnosis not present

## 2020-03-31 DIAGNOSIS — E785 Hyperlipidemia, unspecified: Secondary | ICD-10-CM | POA: Diagnosis not present

## 2020-03-31 DIAGNOSIS — Z7982 Long term (current) use of aspirin: Secondary | ICD-10-CM | POA: Diagnosis not present

## 2020-03-31 DIAGNOSIS — Z951 Presence of aortocoronary bypass graft: Secondary | ICD-10-CM | POA: Diagnosis not present

## 2020-03-31 DIAGNOSIS — Z8249 Family history of ischemic heart disease and other diseases of the circulatory system: Secondary | ICD-10-CM | POA: Diagnosis not present

## 2020-03-31 DIAGNOSIS — Z87891 Personal history of nicotine dependence: Secondary | ICD-10-CM | POA: Diagnosis not present

## 2020-03-31 DIAGNOSIS — Z79899 Other long term (current) drug therapy: Secondary | ICD-10-CM | POA: Diagnosis not present

## 2020-03-31 DIAGNOSIS — Z95828 Presence of other vascular implants and grafts: Secondary | ICD-10-CM | POA: Diagnosis not present

## 2020-03-31 DIAGNOSIS — I119 Hypertensive heart disease without heart failure: Secondary | ICD-10-CM | POA: Diagnosis not present

## 2020-03-31 DIAGNOSIS — I2510 Atherosclerotic heart disease of native coronary artery without angina pectoris: Secondary | ICD-10-CM | POA: Diagnosis not present

## 2020-03-31 DIAGNOSIS — E119 Type 2 diabetes mellitus without complications: Secondary | ICD-10-CM | POA: Diagnosis not present

## 2020-03-31 DIAGNOSIS — Z7984 Long term (current) use of oral hypoglycemic drugs: Secondary | ICD-10-CM | POA: Diagnosis not present

## 2020-03-31 DIAGNOSIS — K219 Gastro-esophageal reflux disease without esophagitis: Secondary | ICD-10-CM | POA: Diagnosis not present

## 2020-03-31 DIAGNOSIS — I252 Old myocardial infarction: Secondary | ICD-10-CM | POA: Diagnosis not present

## 2020-03-31 DIAGNOSIS — M1990 Unspecified osteoarthritis, unspecified site: Secondary | ICD-10-CM | POA: Diagnosis not present

## 2020-03-31 DIAGNOSIS — Z96641 Presence of right artificial hip joint: Secondary | ICD-10-CM | POA: Diagnosis not present

## 2020-03-31 DIAGNOSIS — Z880 Allergy status to penicillin: Secondary | ICD-10-CM | POA: Diagnosis not present

## 2020-04-02 DIAGNOSIS — E119 Type 2 diabetes mellitus without complications: Secondary | ICD-10-CM | POA: Diagnosis not present

## 2020-04-02 DIAGNOSIS — I252 Old myocardial infarction: Secondary | ICD-10-CM | POA: Diagnosis not present

## 2020-04-02 DIAGNOSIS — I119 Hypertensive heart disease without heart failure: Secondary | ICD-10-CM | POA: Diagnosis not present

## 2020-04-02 DIAGNOSIS — Z95828 Presence of other vascular implants and grafts: Secondary | ICD-10-CM | POA: Diagnosis not present

## 2020-04-02 DIAGNOSIS — I2510 Atherosclerotic heart disease of native coronary artery without angina pectoris: Secondary | ICD-10-CM | POA: Diagnosis not present

## 2020-04-02 DIAGNOSIS — Z951 Presence of aortocoronary bypass graft: Secondary | ICD-10-CM | POA: Diagnosis not present

## 2020-04-04 DIAGNOSIS — I252 Old myocardial infarction: Secondary | ICD-10-CM | POA: Diagnosis not present

## 2020-04-04 DIAGNOSIS — E119 Type 2 diabetes mellitus without complications: Secondary | ICD-10-CM | POA: Diagnosis not present

## 2020-04-04 DIAGNOSIS — Z95828 Presence of other vascular implants and grafts: Secondary | ICD-10-CM | POA: Diagnosis not present

## 2020-04-04 DIAGNOSIS — Z951 Presence of aortocoronary bypass graft: Secondary | ICD-10-CM | POA: Diagnosis not present

## 2020-04-04 DIAGNOSIS — I2510 Atherosclerotic heart disease of native coronary artery without angina pectoris: Secondary | ICD-10-CM | POA: Diagnosis not present

## 2020-04-04 DIAGNOSIS — I119 Hypertensive heart disease without heart failure: Secondary | ICD-10-CM | POA: Diagnosis not present

## 2020-04-07 DIAGNOSIS — Z951 Presence of aortocoronary bypass graft: Secondary | ICD-10-CM | POA: Diagnosis not present

## 2020-04-07 DIAGNOSIS — Z95828 Presence of other vascular implants and grafts: Secondary | ICD-10-CM | POA: Diagnosis not present

## 2020-04-07 DIAGNOSIS — I119 Hypertensive heart disease without heart failure: Secondary | ICD-10-CM | POA: Diagnosis not present

## 2020-04-07 DIAGNOSIS — E119 Type 2 diabetes mellitus without complications: Secondary | ICD-10-CM | POA: Diagnosis not present

## 2020-04-07 DIAGNOSIS — I252 Old myocardial infarction: Secondary | ICD-10-CM | POA: Diagnosis not present

## 2020-04-07 DIAGNOSIS — I2510 Atherosclerotic heart disease of native coronary artery without angina pectoris: Secondary | ICD-10-CM | POA: Diagnosis not present

## 2020-04-08 ENCOUNTER — Other Ambulatory Visit: Payer: Self-pay

## 2020-04-08 ENCOUNTER — Other Ambulatory Visit (HOSPITAL_COMMUNITY)
Admission: RE | Admit: 2020-04-08 | Discharge: 2020-04-08 | Disposition: A | Discharge status: Home / Self Care | Payer: Medicare Other | Source: Ambulatory Visit | Attending: Cardiology | Admitting: Cardiology

## 2020-04-08 DIAGNOSIS — I25119 Atherosclerotic heart disease of native coronary artery with unspecified angina pectoris: Secondary | ICD-10-CM | POA: Insufficient documentation

## 2020-04-08 LAB — BASIC METABOLIC PANEL
Anion gap: 12 (ref 5–15)
BUN: 12 mg/dL (ref 8–23)
CO2: 26 mmol/L (ref 22–32)
Calcium: 8.2 mg/dL — ABNORMAL LOW (ref 8.9–10.3)
Chloride: 102 mmol/L (ref 98–111)
Creatinine, Ser: 0.76 mg/dL (ref 0.61–1.24)
GFR, Estimated: >60 mL/min (ref >60)
Glucose, Bld: 159 mg/dL — ABNORMAL HIGH (ref 70–99)
Potassium: 3.3 mmol/L — ABNORMAL LOW (ref 3.5–5.1)
Sodium: 140 mmol/L (ref 135–145)

## 2020-04-09 ENCOUNTER — Telehealth: Payer: Self-pay | Admitting: *Deleted

## 2020-04-09 DIAGNOSIS — I2510 Atherosclerotic heart disease of native coronary artery without angina pectoris: Secondary | ICD-10-CM | POA: Diagnosis not present

## 2020-04-09 DIAGNOSIS — Z951 Presence of aortocoronary bypass graft: Secondary | ICD-10-CM | POA: Diagnosis not present

## 2020-04-09 DIAGNOSIS — I252 Old myocardial infarction: Secondary | ICD-10-CM | POA: Diagnosis not present

## 2020-04-09 DIAGNOSIS — I119 Hypertensive heart disease without heart failure: Secondary | ICD-10-CM | POA: Diagnosis not present

## 2020-04-09 DIAGNOSIS — E119 Type 2 diabetes mellitus without complications: Secondary | ICD-10-CM | POA: Diagnosis not present

## 2020-04-09 DIAGNOSIS — Z79899 Other long term (current) drug therapy: Secondary | ICD-10-CM

## 2020-04-09 DIAGNOSIS — Z95828 Presence of other vascular implants and grafts: Secondary | ICD-10-CM | POA: Diagnosis not present

## 2020-04-09 MED ORDER — POTASSIUM CHLORIDE CRYS ER 20 MEQ PO TBCR: 40.0000 meq | EXTENDED_RELEASE_TABLET | Freq: Every day | ORAL | 1 refills | Status: DC

## 2020-04-09 NOTE — Telephone Encounter (Signed)
-----   Message from Satira Sark, MD sent at 04/08/2020 10:31 AM EDT ----- Results reviewed.  Potassium remains low at 3.3 although has increased from 3.1.  Increase KCl to 40 mEq daily.  Please recheck BMET in 2 weeks.

## 2020-04-09 NOTE — Telephone Encounter (Signed)
Patient informed and verbalized understanding of plan. Say he has plenty of potassium pills on hand and does not need a new rx sent. Lab order faxed to [facility]. Copy sent to PCP

## 2020-04-11 DIAGNOSIS — I252 Old myocardial infarction: Secondary | ICD-10-CM | POA: Diagnosis not present

## 2020-04-11 DIAGNOSIS — I2510 Atherosclerotic heart disease of native coronary artery without angina pectoris: Secondary | ICD-10-CM | POA: Diagnosis not present

## 2020-04-11 DIAGNOSIS — Z95828 Presence of other vascular implants and grafts: Secondary | ICD-10-CM | POA: Diagnosis not present

## 2020-04-11 DIAGNOSIS — I119 Hypertensive heart disease without heart failure: Secondary | ICD-10-CM | POA: Diagnosis not present

## 2020-04-11 DIAGNOSIS — E119 Type 2 diabetes mellitus without complications: Secondary | ICD-10-CM | POA: Diagnosis not present

## 2020-04-11 DIAGNOSIS — Z951 Presence of aortocoronary bypass graft: Secondary | ICD-10-CM | POA: Diagnosis not present

## 2020-04-14 DIAGNOSIS — Z95828 Presence of other vascular implants and grafts: Secondary | ICD-10-CM | POA: Diagnosis not present

## 2020-04-14 DIAGNOSIS — I252 Old myocardial infarction: Secondary | ICD-10-CM | POA: Diagnosis not present

## 2020-04-14 DIAGNOSIS — I119 Hypertensive heart disease without heart failure: Secondary | ICD-10-CM | POA: Diagnosis not present

## 2020-04-14 DIAGNOSIS — E119 Type 2 diabetes mellitus without complications: Secondary | ICD-10-CM | POA: Diagnosis not present

## 2020-04-14 DIAGNOSIS — I2510 Atherosclerotic heart disease of native coronary artery without angina pectoris: Secondary | ICD-10-CM | POA: Diagnosis not present

## 2020-04-14 DIAGNOSIS — Z951 Presence of aortocoronary bypass graft: Secondary | ICD-10-CM | POA: Diagnosis not present

## 2020-04-17 DIAGNOSIS — S6992XA Unspecified injury of left wrist, hand and finger(s), initial encounter: Secondary | ICD-10-CM | POA: Diagnosis not present

## 2020-04-17 DIAGNOSIS — T148XXA Other injury of unspecified body region, initial encounter: Secondary | ICD-10-CM | POA: Diagnosis not present

## 2020-04-17 DIAGNOSIS — S61412A Laceration without foreign body of left hand, initial encounter: Secondary | ICD-10-CM | POA: Diagnosis not present

## 2020-04-18 DIAGNOSIS — E119 Type 2 diabetes mellitus without complications: Secondary | ICD-10-CM | POA: Diagnosis not present

## 2020-04-18 DIAGNOSIS — I119 Hypertensive heart disease without heart failure: Secondary | ICD-10-CM | POA: Diagnosis not present

## 2020-04-18 DIAGNOSIS — I252 Old myocardial infarction: Secondary | ICD-10-CM | POA: Diagnosis not present

## 2020-04-18 DIAGNOSIS — Z95828 Presence of other vascular implants and grafts: Secondary | ICD-10-CM | POA: Diagnosis not present

## 2020-04-18 DIAGNOSIS — Z951 Presence of aortocoronary bypass graft: Secondary | ICD-10-CM | POA: Diagnosis not present

## 2020-04-18 DIAGNOSIS — I2510 Atherosclerotic heart disease of native coronary artery without angina pectoris: Secondary | ICD-10-CM | POA: Diagnosis not present

## 2020-04-21 DIAGNOSIS — I2510 Atherosclerotic heart disease of native coronary artery without angina pectoris: Secondary | ICD-10-CM | POA: Diagnosis not present

## 2020-04-21 DIAGNOSIS — I119 Hypertensive heart disease without heart failure: Secondary | ICD-10-CM | POA: Diagnosis not present

## 2020-04-21 DIAGNOSIS — E119 Type 2 diabetes mellitus without complications: Secondary | ICD-10-CM | POA: Diagnosis not present

## 2020-04-21 DIAGNOSIS — Z951 Presence of aortocoronary bypass graft: Secondary | ICD-10-CM | POA: Diagnosis not present

## 2020-04-21 DIAGNOSIS — Z95828 Presence of other vascular implants and grafts: Secondary | ICD-10-CM | POA: Diagnosis not present

## 2020-04-21 DIAGNOSIS — I252 Old myocardial infarction: Secondary | ICD-10-CM | POA: Diagnosis not present

## 2020-04-23 ENCOUNTER — Other Ambulatory Visit: Payer: Self-pay

## 2020-04-23 ENCOUNTER — Other Ambulatory Visit (HOSPITAL_COMMUNITY)
Admission: RE | Admit: 2020-04-23 | Discharge: 2020-04-23 | Disposition: A | Discharge status: Home / Self Care | Payer: Medicare Other | Source: Ambulatory Visit | Attending: Cardiology | Admitting: Cardiology

## 2020-04-23 DIAGNOSIS — Z951 Presence of aortocoronary bypass graft: Secondary | ICD-10-CM | POA: Diagnosis not present

## 2020-04-23 DIAGNOSIS — Z95828 Presence of other vascular implants and grafts: Secondary | ICD-10-CM | POA: Diagnosis not present

## 2020-04-23 DIAGNOSIS — I2510 Atherosclerotic heart disease of native coronary artery without angina pectoris: Secondary | ICD-10-CM | POA: Diagnosis not present

## 2020-04-23 DIAGNOSIS — I119 Hypertensive heart disease without heart failure: Secondary | ICD-10-CM | POA: Diagnosis not present

## 2020-04-23 DIAGNOSIS — Z79899 Other long term (current) drug therapy: Secondary | ICD-10-CM | POA: Diagnosis not present

## 2020-04-23 DIAGNOSIS — E119 Type 2 diabetes mellitus without complications: Secondary | ICD-10-CM | POA: Diagnosis not present

## 2020-04-23 DIAGNOSIS — I252 Old myocardial infarction: Secondary | ICD-10-CM | POA: Diagnosis not present

## 2020-04-23 LAB — BASIC METABOLIC PANEL
Anion gap: 12 (ref 5–15)
BUN: 12 mg/dL (ref 8–23)
CO2: 24 mmol/L (ref 22–32)
Calcium: 9.1 mg/dL (ref 8.9–10.3)
Chloride: 101 mmol/L (ref 98–111)
Creatinine, Ser: 0.86 mg/dL (ref 0.61–1.24)
GFR, Estimated: >60 mL/min (ref >60)
Glucose, Bld: 204 mg/dL — ABNORMAL HIGH (ref 70–99)
Potassium: 4 mmol/L (ref 3.5–5.1)
Sodium: 137 mmol/L (ref 135–145)

## 2020-04-25 DIAGNOSIS — Z87891 Personal history of nicotine dependence: Secondary | ICD-10-CM | POA: Diagnosis not present

## 2020-04-25 DIAGNOSIS — K219 Gastro-esophageal reflux disease without esophagitis: Secondary | ICD-10-CM | POA: Diagnosis not present

## 2020-04-25 DIAGNOSIS — M1990 Unspecified osteoarthritis, unspecified site: Secondary | ICD-10-CM | POA: Diagnosis not present

## 2020-04-25 DIAGNOSIS — I252 Old myocardial infarction: Secondary | ICD-10-CM | POA: Diagnosis not present

## 2020-04-25 DIAGNOSIS — I2510 Atherosclerotic heart disease of native coronary artery without angina pectoris: Secondary | ICD-10-CM | POA: Diagnosis not present

## 2020-04-25 DIAGNOSIS — Z880 Allergy status to penicillin: Secondary | ICD-10-CM | POA: Diagnosis not present

## 2020-04-25 DIAGNOSIS — Z951 Presence of aortocoronary bypass graft: Secondary | ICD-10-CM | POA: Diagnosis not present

## 2020-04-25 DIAGNOSIS — Z7982 Long term (current) use of aspirin: Secondary | ICD-10-CM | POA: Diagnosis not present

## 2020-04-25 DIAGNOSIS — E119 Type 2 diabetes mellitus without complications: Secondary | ICD-10-CM | POA: Diagnosis not present

## 2020-04-25 DIAGNOSIS — E785 Hyperlipidemia, unspecified: Secondary | ICD-10-CM | POA: Diagnosis not present

## 2020-04-25 DIAGNOSIS — I119 Hypertensive heart disease without heart failure: Secondary | ICD-10-CM | POA: Diagnosis not present

## 2020-04-25 DIAGNOSIS — Z95828 Presence of other vascular implants and grafts: Secondary | ICD-10-CM | POA: Diagnosis not present

## 2020-04-25 DIAGNOSIS — Z79899 Other long term (current) drug therapy: Secondary | ICD-10-CM | POA: Diagnosis not present

## 2020-04-25 DIAGNOSIS — Z7984 Long term (current) use of oral hypoglycemic drugs: Secondary | ICD-10-CM | POA: Diagnosis not present

## 2020-04-25 DIAGNOSIS — Z96641 Presence of right artificial hip joint: Secondary | ICD-10-CM | POA: Diagnosis not present

## 2020-04-25 DIAGNOSIS — Z85828 Personal history of other malignant neoplasm of skin: Secondary | ICD-10-CM | POA: Diagnosis not present

## 2020-04-25 DIAGNOSIS — Z8249 Family history of ischemic heart disease and other diseases of the circulatory system: Secondary | ICD-10-CM | POA: Diagnosis not present

## 2020-04-25 DIAGNOSIS — Z91041 Radiographic dye allergy status: Secondary | ICD-10-CM | POA: Diagnosis not present

## 2020-04-28 DIAGNOSIS — I119 Hypertensive heart disease without heart failure: Secondary | ICD-10-CM | POA: Diagnosis not present

## 2020-04-28 DIAGNOSIS — Z95828 Presence of other vascular implants and grafts: Secondary | ICD-10-CM | POA: Diagnosis not present

## 2020-04-28 DIAGNOSIS — I2510 Atherosclerotic heart disease of native coronary artery without angina pectoris: Secondary | ICD-10-CM | POA: Diagnosis not present

## 2020-04-28 DIAGNOSIS — E119 Type 2 diabetes mellitus without complications: Secondary | ICD-10-CM | POA: Diagnosis not present

## 2020-04-28 DIAGNOSIS — Z951 Presence of aortocoronary bypass graft: Secondary | ICD-10-CM | POA: Diagnosis not present

## 2020-04-28 DIAGNOSIS — I252 Old myocardial infarction: Secondary | ICD-10-CM | POA: Diagnosis not present

## 2020-04-30 DIAGNOSIS — L03114 Cellulitis of left upper limb: Secondary | ICD-10-CM | POA: Diagnosis not present

## 2020-05-02 DIAGNOSIS — Z95828 Presence of other vascular implants and grafts: Secondary | ICD-10-CM | POA: Diagnosis not present

## 2020-05-02 DIAGNOSIS — I252 Old myocardial infarction: Secondary | ICD-10-CM | POA: Diagnosis not present

## 2020-05-02 DIAGNOSIS — I119 Hypertensive heart disease without heart failure: Secondary | ICD-10-CM | POA: Diagnosis not present

## 2020-05-02 DIAGNOSIS — I2510 Atherosclerotic heart disease of native coronary artery without angina pectoris: Secondary | ICD-10-CM | POA: Diagnosis not present

## 2020-05-02 DIAGNOSIS — Z951 Presence of aortocoronary bypass graft: Secondary | ICD-10-CM | POA: Diagnosis not present

## 2020-05-02 DIAGNOSIS — E119 Type 2 diabetes mellitus without complications: Secondary | ICD-10-CM | POA: Diagnosis not present

## 2020-05-07 DIAGNOSIS — I252 Old myocardial infarction: Secondary | ICD-10-CM | POA: Diagnosis not present

## 2020-05-07 DIAGNOSIS — I2510 Atherosclerotic heart disease of native coronary artery without angina pectoris: Secondary | ICD-10-CM | POA: Diagnosis not present

## 2020-05-07 DIAGNOSIS — Z95828 Presence of other vascular implants and grafts: Secondary | ICD-10-CM | POA: Diagnosis not present

## 2020-05-07 DIAGNOSIS — Z951 Presence of aortocoronary bypass graft: Secondary | ICD-10-CM | POA: Diagnosis not present

## 2020-05-07 DIAGNOSIS — E119 Type 2 diabetes mellitus without complications: Secondary | ICD-10-CM | POA: Diagnosis not present

## 2020-05-07 DIAGNOSIS — I119 Hypertensive heart disease without heart failure: Secondary | ICD-10-CM | POA: Diagnosis not present

## 2020-05-12 DIAGNOSIS — I119 Hypertensive heart disease without heart failure: Secondary | ICD-10-CM | POA: Diagnosis not present

## 2020-05-12 DIAGNOSIS — I252 Old myocardial infarction: Secondary | ICD-10-CM | POA: Diagnosis not present

## 2020-05-12 DIAGNOSIS — E119 Type 2 diabetes mellitus without complications: Secondary | ICD-10-CM | POA: Diagnosis not present

## 2020-05-12 DIAGNOSIS — Z951 Presence of aortocoronary bypass graft: Secondary | ICD-10-CM | POA: Diagnosis not present

## 2020-05-12 DIAGNOSIS — I2510 Atherosclerotic heart disease of native coronary artery without angina pectoris: Secondary | ICD-10-CM | POA: Diagnosis not present

## 2020-05-12 DIAGNOSIS — Z95828 Presence of other vascular implants and grafts: Secondary | ICD-10-CM | POA: Diagnosis not present

## 2020-05-14 DIAGNOSIS — I2510 Atherosclerotic heart disease of native coronary artery without angina pectoris: Secondary | ICD-10-CM | POA: Diagnosis not present

## 2020-05-14 DIAGNOSIS — I119 Hypertensive heart disease without heart failure: Secondary | ICD-10-CM | POA: Diagnosis not present

## 2020-05-14 DIAGNOSIS — E119 Type 2 diabetes mellitus without complications: Secondary | ICD-10-CM | POA: Diagnosis not present

## 2020-05-14 DIAGNOSIS — I252 Old myocardial infarction: Secondary | ICD-10-CM | POA: Diagnosis not present

## 2020-05-14 DIAGNOSIS — Z951 Presence of aortocoronary bypass graft: Secondary | ICD-10-CM | POA: Diagnosis not present

## 2020-05-14 DIAGNOSIS — Z95828 Presence of other vascular implants and grafts: Secondary | ICD-10-CM | POA: Diagnosis not present

## 2020-05-16 DIAGNOSIS — I252 Old myocardial infarction: Secondary | ICD-10-CM | POA: Diagnosis not present

## 2020-05-16 DIAGNOSIS — I119 Hypertensive heart disease without heart failure: Secondary | ICD-10-CM | POA: Diagnosis not present

## 2020-05-16 DIAGNOSIS — E119 Type 2 diabetes mellitus without complications: Secondary | ICD-10-CM | POA: Diagnosis not present

## 2020-05-16 DIAGNOSIS — Z951 Presence of aortocoronary bypass graft: Secondary | ICD-10-CM | POA: Diagnosis not present

## 2020-05-16 DIAGNOSIS — Z95828 Presence of other vascular implants and grafts: Secondary | ICD-10-CM | POA: Diagnosis not present

## 2020-05-16 DIAGNOSIS — I2510 Atherosclerotic heart disease of native coronary artery without angina pectoris: Secondary | ICD-10-CM | POA: Diagnosis not present

## 2020-05-19 DIAGNOSIS — Z951 Presence of aortocoronary bypass graft: Secondary | ICD-10-CM | POA: Diagnosis not present

## 2020-05-19 DIAGNOSIS — E119 Type 2 diabetes mellitus without complications: Secondary | ICD-10-CM | POA: Diagnosis not present

## 2020-05-19 DIAGNOSIS — I2510 Atherosclerotic heart disease of native coronary artery without angina pectoris: Secondary | ICD-10-CM | POA: Diagnosis not present

## 2020-05-19 DIAGNOSIS — I119 Hypertensive heart disease without heart failure: Secondary | ICD-10-CM | POA: Diagnosis not present

## 2020-05-19 DIAGNOSIS — Z95828 Presence of other vascular implants and grafts: Secondary | ICD-10-CM | POA: Diagnosis not present

## 2020-05-19 DIAGNOSIS — I252 Old myocardial infarction: Secondary | ICD-10-CM | POA: Diagnosis not present

## 2020-05-21 DIAGNOSIS — Z951 Presence of aortocoronary bypass graft: Secondary | ICD-10-CM | POA: Diagnosis not present

## 2020-05-21 DIAGNOSIS — I119 Hypertensive heart disease without heart failure: Secondary | ICD-10-CM | POA: Diagnosis not present

## 2020-05-21 DIAGNOSIS — I2510 Atherosclerotic heart disease of native coronary artery without angina pectoris: Secondary | ICD-10-CM | POA: Diagnosis not present

## 2020-05-21 DIAGNOSIS — E119 Type 2 diabetes mellitus without complications: Secondary | ICD-10-CM | POA: Diagnosis not present

## 2020-05-21 DIAGNOSIS — Z95828 Presence of other vascular implants and grafts: Secondary | ICD-10-CM | POA: Diagnosis not present

## 2020-05-21 DIAGNOSIS — I252 Old myocardial infarction: Secondary | ICD-10-CM | POA: Diagnosis not present

## 2020-05-23 DIAGNOSIS — I252 Old myocardial infarction: Secondary | ICD-10-CM | POA: Diagnosis not present

## 2020-05-23 DIAGNOSIS — Z95828 Presence of other vascular implants and grafts: Secondary | ICD-10-CM | POA: Diagnosis not present

## 2020-05-23 DIAGNOSIS — E119 Type 2 diabetes mellitus without complications: Secondary | ICD-10-CM | POA: Diagnosis not present

## 2020-05-23 DIAGNOSIS — I119 Hypertensive heart disease without heart failure: Secondary | ICD-10-CM | POA: Diagnosis not present

## 2020-05-23 DIAGNOSIS — Z951 Presence of aortocoronary bypass graft: Secondary | ICD-10-CM | POA: Diagnosis not present

## 2020-05-23 DIAGNOSIS — I2510 Atherosclerotic heart disease of native coronary artery without angina pectoris: Secondary | ICD-10-CM | POA: Diagnosis not present

## 2020-05-24 DIAGNOSIS — I10 Essential (primary) hypertension: Secondary | ICD-10-CM | POA: Diagnosis not present

## 2020-05-24 DIAGNOSIS — E1165 Type 2 diabetes mellitus with hyperglycemia: Secondary | ICD-10-CM | POA: Diagnosis not present

## 2020-05-24 DIAGNOSIS — M160 Bilateral primary osteoarthritis of hip: Secondary | ICD-10-CM | POA: Diagnosis not present

## 2020-05-24 DIAGNOSIS — Z7984 Long term (current) use of oral hypoglycemic drugs: Secondary | ICD-10-CM | POA: Diagnosis not present

## 2020-05-26 DIAGNOSIS — Z91041 Radiographic dye allergy status: Secondary | ICD-10-CM | POA: Diagnosis not present

## 2020-05-26 DIAGNOSIS — M1990 Unspecified osteoarthritis, unspecified site: Secondary | ICD-10-CM | POA: Diagnosis not present

## 2020-05-26 DIAGNOSIS — E119 Type 2 diabetes mellitus without complications: Secondary | ICD-10-CM | POA: Diagnosis not present

## 2020-05-26 DIAGNOSIS — Z85828 Personal history of other malignant neoplasm of skin: Secondary | ICD-10-CM | POA: Diagnosis not present

## 2020-05-26 DIAGNOSIS — I119 Hypertensive heart disease without heart failure: Secondary | ICD-10-CM | POA: Diagnosis not present

## 2020-05-26 DIAGNOSIS — Z95828 Presence of other vascular implants and grafts: Secondary | ICD-10-CM | POA: Diagnosis not present

## 2020-05-26 DIAGNOSIS — I252 Old myocardial infarction: Secondary | ICD-10-CM | POA: Diagnosis not present

## 2020-05-26 DIAGNOSIS — E785 Hyperlipidemia, unspecified: Secondary | ICD-10-CM | POA: Diagnosis not present

## 2020-05-26 DIAGNOSIS — Z79899 Other long term (current) drug therapy: Secondary | ICD-10-CM | POA: Diagnosis not present

## 2020-05-26 DIAGNOSIS — K219 Gastro-esophageal reflux disease without esophagitis: Secondary | ICD-10-CM | POA: Diagnosis not present

## 2020-05-26 DIAGNOSIS — Z951 Presence of aortocoronary bypass graft: Secondary | ICD-10-CM | POA: Diagnosis not present

## 2020-05-26 DIAGNOSIS — Z96641 Presence of right artificial hip joint: Secondary | ICD-10-CM | POA: Diagnosis not present

## 2020-05-26 DIAGNOSIS — Z880 Allergy status to penicillin: Secondary | ICD-10-CM | POA: Diagnosis not present

## 2020-05-26 DIAGNOSIS — Z7982 Long term (current) use of aspirin: Secondary | ICD-10-CM | POA: Diagnosis not present

## 2020-05-26 DIAGNOSIS — Z87891 Personal history of nicotine dependence: Secondary | ICD-10-CM | POA: Diagnosis not present

## 2020-05-26 DIAGNOSIS — Z7984 Long term (current) use of oral hypoglycemic drugs: Secondary | ICD-10-CM | POA: Diagnosis not present

## 2020-05-26 DIAGNOSIS — Z8249 Family history of ischemic heart disease and other diseases of the circulatory system: Secondary | ICD-10-CM | POA: Diagnosis not present

## 2020-05-26 DIAGNOSIS — I2510 Atherosclerotic heart disease of native coronary artery without angina pectoris: Secondary | ICD-10-CM | POA: Diagnosis not present

## 2020-05-28 DIAGNOSIS — I119 Hypertensive heart disease without heart failure: Secondary | ICD-10-CM | POA: Diagnosis not present

## 2020-05-28 DIAGNOSIS — I2510 Atherosclerotic heart disease of native coronary artery without angina pectoris: Secondary | ICD-10-CM | POA: Diagnosis not present

## 2020-05-28 DIAGNOSIS — E119 Type 2 diabetes mellitus without complications: Secondary | ICD-10-CM | POA: Diagnosis not present

## 2020-05-28 DIAGNOSIS — Z951 Presence of aortocoronary bypass graft: Secondary | ICD-10-CM | POA: Diagnosis not present

## 2020-05-28 DIAGNOSIS — Z95828 Presence of other vascular implants and grafts: Secondary | ICD-10-CM | POA: Diagnosis not present

## 2020-05-28 DIAGNOSIS — I252 Old myocardial infarction: Secondary | ICD-10-CM | POA: Diagnosis not present

## 2020-06-02 DIAGNOSIS — I2510 Atherosclerotic heart disease of native coronary artery without angina pectoris: Secondary | ICD-10-CM | POA: Diagnosis not present

## 2020-06-02 DIAGNOSIS — Z951 Presence of aortocoronary bypass graft: Secondary | ICD-10-CM | POA: Diagnosis not present

## 2020-06-02 DIAGNOSIS — I119 Hypertensive heart disease without heart failure: Secondary | ICD-10-CM | POA: Diagnosis not present

## 2020-06-02 DIAGNOSIS — E119 Type 2 diabetes mellitus without complications: Secondary | ICD-10-CM | POA: Diagnosis not present

## 2020-06-02 DIAGNOSIS — I252 Old myocardial infarction: Secondary | ICD-10-CM | POA: Diagnosis not present

## 2020-06-02 DIAGNOSIS — Z95828 Presence of other vascular implants and grafts: Secondary | ICD-10-CM | POA: Diagnosis not present

## 2020-07-14 DIAGNOSIS — R600 Localized edema: Secondary | ICD-10-CM | POA: Diagnosis not present

## 2020-07-14 DIAGNOSIS — S7011XA Contusion of right thigh, initial encounter: Secondary | ICD-10-CM | POA: Diagnosis not present

## 2020-07-14 DIAGNOSIS — Z951 Presence of aortocoronary bypass graft: Secondary | ICD-10-CM | POA: Diagnosis not present

## 2020-07-14 DIAGNOSIS — I10 Essential (primary) hypertension: Secondary | ICD-10-CM | POA: Diagnosis not present

## 2020-07-14 DIAGNOSIS — Z6831 Body mass index (BMI) 31.0-31.9, adult: Secondary | ICD-10-CM | POA: Diagnosis not present

## 2020-07-14 DIAGNOSIS — E1165 Type 2 diabetes mellitus with hyperglycemia: Secondary | ICD-10-CM | POA: Diagnosis not present

## 2020-07-24 DIAGNOSIS — M160 Bilateral primary osteoarthritis of hip: Secondary | ICD-10-CM | POA: Diagnosis not present

## 2020-07-24 DIAGNOSIS — I10 Essential (primary) hypertension: Secondary | ICD-10-CM | POA: Diagnosis not present

## 2020-07-24 DIAGNOSIS — Z7984 Long term (current) use of oral hypoglycemic drugs: Secondary | ICD-10-CM | POA: Diagnosis not present

## 2020-07-24 DIAGNOSIS — E1165 Type 2 diabetes mellitus with hyperglycemia: Secondary | ICD-10-CM | POA: Diagnosis not present

## 2020-08-24 DIAGNOSIS — M160 Bilateral primary osteoarthritis of hip: Secondary | ICD-10-CM | POA: Diagnosis not present

## 2020-08-24 DIAGNOSIS — E1165 Type 2 diabetes mellitus with hyperglycemia: Secondary | ICD-10-CM | POA: Diagnosis not present

## 2020-08-24 DIAGNOSIS — Z7984 Long term (current) use of oral hypoglycemic drugs: Secondary | ICD-10-CM | POA: Diagnosis not present

## 2020-08-24 DIAGNOSIS — I10 Essential (primary) hypertension: Secondary | ICD-10-CM | POA: Diagnosis not present

## 2020-09-03 DIAGNOSIS — L821 Other seborrheic keratosis: Secondary | ICD-10-CM | POA: Diagnosis not present

## 2020-09-03 DIAGNOSIS — Z85828 Personal history of other malignant neoplasm of skin: Secondary | ICD-10-CM | POA: Diagnosis not present

## 2020-09-03 DIAGNOSIS — L820 Inflamed seborrheic keratosis: Secondary | ICD-10-CM | POA: Diagnosis not present

## 2020-09-03 DIAGNOSIS — L219 Seborrheic dermatitis, unspecified: Secondary | ICD-10-CM | POA: Diagnosis not present

## 2020-09-03 DIAGNOSIS — D1801 Hemangioma of skin and subcutaneous tissue: Secondary | ICD-10-CM | POA: Diagnosis not present

## 2020-09-03 DIAGNOSIS — L570 Actinic keratosis: Secondary | ICD-10-CM | POA: Diagnosis not present

## 2020-09-03 DIAGNOSIS — D692 Other nonthrombocytopenic purpura: Secondary | ICD-10-CM | POA: Diagnosis not present

## 2020-09-03 DIAGNOSIS — L905 Scar conditions and fibrosis of skin: Secondary | ICD-10-CM | POA: Diagnosis not present

## 2020-09-23 ENCOUNTER — Other Ambulatory Visit: Payer: Self-pay | Admitting: Cardiology

## 2020-09-27 NOTE — Progress Notes (Signed)
Cardiology Office Note  Date: 09/30/2020   ID: Sean Villarreal, DOB September 08, 1940, MRN [id number]  PCP:  [facility]  Cardiologist:  Rozann Lesches, MD Electrophysiologist:  None   Chief Complaint  Patient presents with   Cardiac follow-up    History of Present Illness: Sean Villarreal is a 80 y.o. male last seen in February.  He is here for a routine visit.  He does not report any angina symptoms or palpitations in the interim.  Sadly, his wife passed away back in Jul 03, 2022, they were married for 58 years.  He does have family for support but is still obviously dealing with grief.  He does go out for a mile walk 5 to 6 days a week.  He was taken off Eliquis and Amiodarone at the last visit.  Heart rate is regular today.  I reviewed his medications which are noted below.  We discussed reducing frequency of Lasix with potassium supplement, also starting Jardiance.  He has not had to use any nitroglycerin.  Past Medical History:  Diagnosis Date   Arthritis    Basal cell carcinoma    Coronary artery disease    a. DES to LAD and LCx April 2014 b. PTCA ostial circumflex 03/2016 due to ISR c.  9/18 PCI/DESx2 osital Lcx (ISR), p/mLCx  d. s/p CABG on 01/24/2020 with LIMA-LAD and SVG-OM.   Dyslipidemia    Essential hypertension    GERD (gastroesophageal reflux disease)    Hematuria    History of blood transfusion 09/2012   History of kidney stones    NSTEMI (non-ST elevated myocardial infarction) ([place]) 08/2012   Type II diabetes mellitus ([place])     Past Surgical History:  Procedure Laterality Date   BASAL CELL CARCINOMA EXCISION     "right cheek; both shoulders"   CARDIAC CATHETERIZATION     CATARACT EXTRACTION W/ INTRAOCULAR LENS  IMPLANT, BILATERAL Bilateral    COLONOSCOPY N/A 03/19/2016   Procedure: COLONOSCOPY;  Surgeon: Rogene Houston, MD;  [facility];  Service: Endoscopy;  Laterality: N/A;  9:15   CORONARY ARTERY BYPASS GRAFT N/A 01/24/2020    Procedure: CORONARY ARTERY BYPASS GRAFTING (CABG), ON PUMP, TIMES TWO, USING LEFT INTERNAL MAMMARY ARTERY AND ENDOSCOPICALLY HARVESTED RIGHT GREATER SAPHENOUS VEIN;  Surgeon: Melrose Nakayama, MD;  Location: [facility];  Service: Open Heart Surgery;  Laterality: N/A;   CORONARY BALLOON ANGIOPLASTY N/A 04/15/2016   Procedure: Coronary Balloon Angioplasty;  Surgeon: Peter M Martinique, MD;  Location: [place] [facility];  Service: Cardiovascular;  Laterality: N/A;   CORONARY STENT INTERVENTION N/A 10/11/2016   Procedure: CORONARY STENT INTERVENTION;  Surgeon: Jettie Booze, MD;  Location: [place] [facility];  Service: Cardiovascular;  Laterality: N/A;   CYSTOSCOPY W/ URETEROSCOPY W/ LITHOTRIPSY  10/2012   /notes 11/10/2012   CYSTOSCOPY WITH STENT PLACEMENT  08/2012; 09/2012   EYE SURGERY Left ~ 02/2016   "for crinkled lens"   LEFT HEART CATH AND CORONARY ANGIOGRAPHY N/A 04/15/2016   Procedure: Left Heart Cath and Coronary Angiography;  Surgeon: Peter M Martinique, MD;  Location: [place] CV LAB;  Service: Cardiovascular;  Laterality: N/A;   LEFT HEART CATH AND CORONARY ANGIOGRAPHY N/A 10/11/2016   Procedure: LEFT HEART CATH AND CORONARY ANGIOGRAPHY;  Surgeon: Jettie Booze, MD;  Location: [place] [facility] LAB;  Service: Cardiovascular;  Laterality: N/A;   LEFT HEART CATH AND CORONARY ANGIOGRAPHY N/A 03/31/2018   Procedure: LEFT HEART CATH AND CORONARY ANGIOGRAPHY;  Surgeon:  Troy Sine, MD;  Location: [place] [facility] LAB;  Service: Cardiovascular;  Laterality: N/A;   LEFT HEART CATH AND CORONARY ANGIOGRAPHY N/A 01/21/2020   Procedure: LEFT HEART CATH AND CORONARY ANGIOGRAPHY;  Surgeon: Belva Crome, MD;  Location: [place] [facility] LAB;  Service: Cardiovascular;  Laterality: N/A;   LEFT HEART CATHETERIZATION WITH CORONARY ANGIOGRAM N/A 05/05/2012   Procedure: LEFT HEART CATHETERIZATION WITH CORONARY ANGIOGRAM;  Surgeon: Burnell Blanks, MD;  Location: [facility] CATH LAB;  Service: Cardiovascular;   Laterality: N/A;   LEFT HEART CATHETERIZATION WITH CORONARY ANGIOGRAM N/A 05/15/2012   Procedure: LEFT HEART CATHETERIZATION WITH CORONARY ANGIOGRAM;  Surgeon: Burnell Blanks, MD;  Location: [facility] CATH LAB;  Service: Cardiovascular;  Laterality: N/A;   TEE WITHOUT CARDIOVERSION N/A 01/24/2020   Procedure: TRANSESOPHAGEAL ECHOCARDIOGRAM (TEE);  Surgeon: Melrose Nakayama, MD;  Location: [place];  Service: Open Heart Surgery;  Laterality: N/A;   TONSILLECTOMY  1948    Current Outpatient Medications  Medication Sig Dispense Refill   amLODipine (NORVASC) 10 MG tablet Take 1 tablet once daily or as directed. 90 tablet 3   aspirin 81 MG tablet Take 81 mg by mouth daily.     atorvastatin (LIPITOR) 80 MG tablet Take 80 mg by mouth at bedtime.     carvedilol (COREG) 25 MG tablet Take 1 tablet (25 mg total) by mouth 2 (two) times daily with a meal. 60 tablet 11   empagliflozin (JARDIANCE) 10 MG TABS tablet Take 1 tablet (10 mg total) by mouth daily before breakfast. 30 tablet 6   glimepiride (AMARYL) 1 MG tablet Take 1 mg by mouth daily with breakfast.     lisinopril (ZESTRIL) 40 MG tablet Take 40 mg by mouth daily.     metFORMIN (GLUCOPHAGE) 1000 MG tablet Take 1,000 mg by mouth 2 (two) times daily with a meal.     nitroGLYCERIN (NITROSTAT) 0.4 MG SL tablet Place 0.4 mg under the tongue every 5 (five) minutes as needed for chest pain.     psyllium (METAMUCIL SMOOTH TEXTURE) 28 % packet Take 1 packet by mouth 2 (two) times daily.     tamsulosin (FLOMAX) 0.4 MG CAPS capsule Take 0.4 mg by mouth.     traMADol (ULTRAM) 50 MG tablet Take by mouth every 6 (six) hours as needed.     furosemide (LASIX) 20 MG tablet Take 1 tablet (20 mg total) by mouth every other day. 45 tablet 2   potassium chloride SA (KLOR-CON M20) 20 MEQ tablet Take 2 tablets (40 mEq total) by mouth every other day. 90 tablet 2   No current facility-administered medications for this visit.   Allergies:  Contrast media [iodinated  diagnostic agents] and Penicillins   ROS: No orthopnea or PND.  Occasional leg swelling.  Physical Exam: VS:  BP 128/74   Pulse 92   Ht '5\' 8"'$  (1.727 m)   Wt 209 lb 12.8 oz (95.2 kg)   SpO2 97%   BMI 31.90 kg/m , BMI Body mass index is 31.9 kg/m.  Wt Readings from Last 3 Encounters:  09/30/20 209 lb 12.8 oz (95.2 kg)  03/24/20 211 lb 12.8 oz (96.1 kg)  02/26/20 203 lb (92.1 kg)    General: Elderly male, appears comfortable at rest. HEENT: Conjunctiva and lids normal, wearing a mask. Neck: Supple, no elevated JVP or carotid bruits, no thyromegaly. Lungs: Clear to auscultation, nonlabored breathing at rest. Cardiac: Regular rate and rhythm, no S3, 3/6 systolic murmur, no pericardial rub. Extremities: No  pitting edema.  ECG:  An ECG dated 03/24/2020 was personally reviewed today and demonstrated:  Sinus rhythm with right bundle branch block.  Recent Labwork: 01/20/2020: ALT 23; AST 20 01/25/2020: Magnesium 1.8 02/13/2020: Hemoglobin 12.6; Platelets 356 04/23/2020: BUN 12; Creatinine, Ser 0.86; Potassium 4.0; Sodium 137     Component Value Date/Time   CHOL 132 01/22/2020 0637   TRIG 67 01/22/2020 0637   HDL 52 01/22/2020 0637   CHOLHDL 2.5 01/22/2020 0637   VLDL 13 01/22/2020 0637   LDLCALC 67 01/22/2020 0637    Other Studies Reviewed Today:  Echocardiogram 01/21/2020:  1. Left ventricular ejection fraction, by estimation, is 55 to 60%. The  left ventricle has normal function. The left ventricle has no regional  wall motion abnormalities. There is mild concentric left ventricular  hypertrophy. Left ventricular diastolic  parameters are consistent with Grade I diastolic dysfunction (impaired  relaxation).   2. Right ventricular systolic function is normal. The right ventricular  size is normal.   3. Left atrial size was mildly dilated.   4. The mitral valve is abnormal. Trivial mitral valve regurgitation.  Moderate mitral annular calcification.   5. The aortic valve is  abnormal. There is moderate calcification of the  aortic valve. There is moderate thickening of the aortic valve. Aortic  valve regurgitation is not visualized. Mild aortic valve stenosis.    Carotid Dopplers 01/22/2020: Summary:  Right Carotid: Velocities in the right ICA are consistent with a 1-39%  stenosis.   Left Carotid: Velocities in the left ICA are consistent with a 1-39%  stenosis.  Vertebrals:  Bilateral vertebral arteries demonstrate antegrade flow.  Subclavians: Normal flow hemodynamics were seen in bilateral subclavian               arteries.   Assessment and Plan:  1.  Multivessel CAD status post CABG in December 2021.  He is doing well without active angina, walking for regular exercise.  LVEF 55 to 60%.  Current Lasix with potassium supplement every other day, may be able to go to an as needed regimen.  Continue aspirin, Coreg, Lipitor, lisinopril, and Norvasc.  Also adding Jardiance 10 mg daily.  Check BMET in 1 month.  2.  Postoperative atrial fibrillation without recurrences.  Already taken off Eliquis and amiodarone.  Continue observation.  CHA2DS2-VASc score is 4, if he has further events, would resume anticoagulation.  3.  Mild aortic stenosis by echocardiogram in December 2021, no change in cardiac murmur.  Continue observation.  Medication Adjustments/Labs and Tests Ordered: Current medicines are reviewed at length with the patient today.  Concerns regarding medicines are outlined above.   Tests Ordered: Orders Placed This Encounter  Procedures   Basic metabolic panel     Medication Changes: Meds ordered this encounter  Medications   empagliflozin (JARDIANCE) 10 MG TABS tablet    Sig: Take 1 tablet (10 mg total) by mouth daily before breakfast.    Dispense:  30 tablet    Refill:  6    09/30/2020 NEW   furosemide (LASIX) 20 MG tablet    Sig: Take 1 tablet (20 mg total) by mouth every other day.    Dispense:  45 tablet    Refill:  2    09/30/2020  dose decrease   potassium chloride SA (KLOR-CON M20) 20 MEQ tablet    Sig: Take 2 tablets (40 mEq total) by mouth every other day.    Dispense:  90 tablet    Refill:  2  09/30/2020 dose decrease     Disposition:  Follow up  6 months.  Signed, Satira Sark, MD, [facility] 09/30/2020 8:42 AM    [facility] [place], [place], [place] [address] Phone: [phone]; Fax: [phone]

## 2020-09-30 ENCOUNTER — Encounter: Payer: Self-pay | Admitting: Cardiology

## 2020-09-30 ENCOUNTER — Ambulatory Visit (INDEPENDENT_AMBULATORY_CARE_PROVIDER_SITE_OTHER): Payer: Medicare Other | Admitting: Cardiology

## 2020-09-30 ENCOUNTER — Other Ambulatory Visit: Payer: Self-pay

## 2020-09-30 ENCOUNTER — Other Ambulatory Visit: Payer: Self-pay | Admitting: *Deleted

## 2020-09-30 VITALS — BP 128/74 | HR 92 | Ht 68.0 in | Wt 209.8 lb

## 2020-09-30 DIAGNOSIS — I4891 Unspecified atrial fibrillation: Secondary | ICD-10-CM

## 2020-09-30 DIAGNOSIS — I25119 Atherosclerotic heart disease of native coronary artery with unspecified angina pectoris: Secondary | ICD-10-CM

## 2020-09-30 DIAGNOSIS — Z79899 Other long term (current) drug therapy: Secondary | ICD-10-CM

## 2020-09-30 DIAGNOSIS — I2510 Atherosclerotic heart disease of native coronary artery without angina pectoris: Secondary | ICD-10-CM

## 2020-09-30 DIAGNOSIS — I9789 Other postprocedural complications and disorders of the circulatory system, not elsewhere classified: Secondary | ICD-10-CM | POA: Diagnosis not present

## 2020-09-30 DIAGNOSIS — I350 Nonrheumatic aortic (valve) stenosis: Secondary | ICD-10-CM

## 2020-09-30 MED ORDER — EMPAGLIFLOZIN 10 MG PO TABS
10.0000 mg | ORAL_TABLET | Freq: Every day | ORAL | 1 refills | Status: DC
Start: 2020-09-30 — End: 2020-09-30

## 2020-09-30 MED ORDER — POTASSIUM CHLORIDE CRYS ER 20 MEQ PO TBCR: 40.0000 meq | EXTENDED_RELEASE_TABLET | ORAL | 2 refills | Status: DC

## 2020-09-30 MED ORDER — EMPAGLIFLOZIN 10 MG PO TABS: 10.0000 mg | ORAL_TABLET | Freq: Every day | ORAL | 0 refills | Status: DC

## 2020-09-30 MED ORDER — FUROSEMIDE 20 MG PO TABS: 20.0000 mg | ORAL_TABLET | ORAL | 2 refills | Status: DC

## 2020-09-30 MED ORDER — EMPAGLIFLOZIN 10 MG PO TABS: 10.0000 mg | ORAL_TABLET | Freq: Every day | ORAL | 6 refills | Status: DC

## 2020-09-30 NOTE — Patient Instructions (Addendum)
Medication Instructions:  Your physician has recommended you make the following change in your medication:  Start jardiance 10 mg daily Decrease furosemide to 20 mg every other day Decrease potassium to 40 meq every other day Continue other medications the same  Labwork: BMET in [place] appointment needed  Testing/Procedures: none  Follow-Up: Your physician recommends that you schedule a follow-up appointment in: 6 months  Any Other Special Instructions Will Be Listed Below (If Applicable).  If you need a refill on your cardiac medications before your next appointment, please call your pharmacy.

## 2020-10-24 DIAGNOSIS — I10 Essential (primary) hypertension: Secondary | ICD-10-CM | POA: Diagnosis not present

## 2020-10-24 DIAGNOSIS — M160 Bilateral primary osteoarthritis of hip: Secondary | ICD-10-CM | POA: Diagnosis not present

## 2020-10-24 DIAGNOSIS — Z7984 Long term (current) use of oral hypoglycemic drugs: Secondary | ICD-10-CM | POA: Diagnosis not present

## 2020-10-24 DIAGNOSIS — E1165 Type 2 diabetes mellitus with hyperglycemia: Secondary | ICD-10-CM | POA: Diagnosis not present

## 2020-10-30 DIAGNOSIS — Z79899 Other long term (current) drug therapy: Secondary | ICD-10-CM | POA: Diagnosis not present

## 2020-11-03 ENCOUNTER — Telehealth: Payer: Self-pay | Admitting: *Deleted

## 2020-11-03 NOTE — Telephone Encounter (Signed)
Patient informed. Copy sent to PCP °

## 2020-11-03 NOTE — Telephone Encounter (Signed)
-----   Message from Satira Sark, MD sent at 10/30/2020  2:33 PM EDT ----- Results reviewed.  Potassium and renal function normal.  Continue with current medications and follow-up plan.

## 2020-11-04 DIAGNOSIS — Z23 Encounter for immunization: Secondary | ICD-10-CM | POA: Diagnosis not present

## 2020-11-20 ENCOUNTER — Telehealth: Payer: Self-pay | Admitting: Cardiology

## 2020-11-20 NOTE — Telephone Encounter (Signed)
Patient informed and verbalized understanding of plan. 

## 2020-11-20 NOTE — Telephone Encounter (Signed)
  Pt c/o medication issue:  1. Name of Medication: empagliflozin (JARDIANCE) 10 MG TABS tablet  2. How are you currently taking this medication (dosage and times per day)? Take 1 tablet (10 mg total) by mouth daily before breakfast.  3. Are you having a reaction (difficulty breathing--STAT)?   4. What is your medication issue? Pt said he's been taking this med for 6 weeks and he is having diarrhea with it, he stopped taking it for few days and the diarrhea stopped and when he tried to resume taking jardiance he gets the diarrhea again. He wanted to know if Dr. Domenic Polite would like to change it

## 2020-11-29 DIAGNOSIS — K5792 Diverticulitis of intestine, part unspecified, without perforation or abscess without bleeding: Secondary | ICD-10-CM | POA: Diagnosis not present

## 2020-12-05 ENCOUNTER — Emergency Department (HOSPITAL_COMMUNITY)
Admission: EM | Admit: 2020-12-05 | Discharge: 2020-12-05 | Disposition: A | Discharge status: Home / Self Care | Payer: Medicare Other | Attending: Emergency Medicine | Admitting: Emergency Medicine

## 2020-12-05 ENCOUNTER — Emergency Department (HOSPITAL_COMMUNITY): Payer: Medicare Other

## 2020-12-05 ENCOUNTER — Encounter (HOSPITAL_COMMUNITY): Payer: Self-pay | Admitting: *Deleted

## 2020-12-05 DIAGNOSIS — K5730 Diverticulosis of large intestine without perforation or abscess without bleeding: Secondary | ICD-10-CM | POA: Diagnosis not present

## 2020-12-05 DIAGNOSIS — Z7984 Long term (current) use of oral hypoglycemic drugs: Secondary | ICD-10-CM | POA: Diagnosis not present

## 2020-12-05 DIAGNOSIS — Z79899 Other long term (current) drug therapy: Secondary | ICD-10-CM | POA: Insufficient documentation

## 2020-12-05 DIAGNOSIS — I10 Essential (primary) hypertension: Secondary | ICD-10-CM | POA: Insufficient documentation

## 2020-12-05 DIAGNOSIS — Z7982 Long term (current) use of aspirin: Secondary | ICD-10-CM | POA: Insufficient documentation

## 2020-12-05 DIAGNOSIS — I2510 Atherosclerotic heart disease of native coronary artery without angina pectoris: Secondary | ICD-10-CM | POA: Insufficient documentation

## 2020-12-05 DIAGNOSIS — Z7901 Long term (current) use of anticoagulants: Secondary | ICD-10-CM | POA: Insufficient documentation

## 2020-12-05 DIAGNOSIS — K4090 Unilateral inguinal hernia, without obstruction or gangrene, not specified as recurrent: Secondary | ICD-10-CM

## 2020-12-05 DIAGNOSIS — Z951 Presence of aortocoronary bypass graft: Secondary | ICD-10-CM | POA: Diagnosis not present

## 2020-12-05 DIAGNOSIS — Z6831 Body mass index (BMI) 31.0-31.9, adult: Secondary | ICD-10-CM | POA: Diagnosis not present

## 2020-12-05 DIAGNOSIS — Z85828 Personal history of other malignant neoplasm of skin: Secondary | ICD-10-CM | POA: Insufficient documentation

## 2020-12-05 DIAGNOSIS — Z87891 Personal history of nicotine dependence: Secondary | ICD-10-CM | POA: Insufficient documentation

## 2020-12-05 DIAGNOSIS — R197 Diarrhea, unspecified: Secondary | ICD-10-CM | POA: Diagnosis not present

## 2020-12-05 DIAGNOSIS — I700 Atherosclerosis of aorta: Secondary | ICD-10-CM | POA: Diagnosis not present

## 2020-12-05 DIAGNOSIS — E119 Type 2 diabetes mellitus without complications: Secondary | ICD-10-CM | POA: Insufficient documentation

## 2020-12-05 DIAGNOSIS — K5792 Diverticulitis of intestine, part unspecified, without perforation or abscess without bleeding: Secondary | ICD-10-CM | POA: Diagnosis not present

## 2020-12-05 DIAGNOSIS — K4091 Unilateral inguinal hernia, without obstruction or gangrene, recurrent: Secondary | ICD-10-CM | POA: Insufficient documentation

## 2020-12-05 DIAGNOSIS — R1032 Left lower quadrant pain: Secondary | ICD-10-CM | POA: Diagnosis not present

## 2020-12-05 LAB — URINALYSIS, ROUTINE W REFLEX MICROSCOPIC
Bacteria, UA: NONE SEEN
Bilirubin Urine: NEGATIVE
Glucose, UA: NEGATIVE mg/dL
Ketones, ur: 20 mg/dL — AB
Nitrite: NEGATIVE
Protein, ur: NEGATIVE mg/dL
Specific Gravity, Urine: 1.026 (ref 1.005–1.030)
pH: 5 (ref 5.0–8.0)

## 2020-12-05 LAB — COMPREHENSIVE METABOLIC PANEL
ALT: 31 U/L (ref 0–44)
AST: 23 U/L (ref 15–41)
Albumin: 4.1 g/dL (ref 3.5–5.0)
Alkaline Phosphatase: 44 U/L (ref 38–126)
Anion gap: 9 (ref 5–15)
BUN: 11 mg/dL (ref 8–23)
CO2: 27 mmol/L (ref 22–32)
Calcium: 8.9 mg/dL (ref 8.9–10.3)
Chloride: 103 mmol/L (ref 98–111)
Creatinine, Ser: 0.77 mg/dL (ref 0.61–1.24)
GFR, Estimated: >60 mL/min (ref >60)
Glucose, Bld: 146 mg/dL — ABNORMAL HIGH (ref 70–99)
Potassium: 3.5 mmol/L (ref 3.5–5.1)
Sodium: 139 mmol/L (ref 135–145)
Total Bilirubin: 1.4 mg/dL — ABNORMAL HIGH (ref 0.3–1.2)
Total Protein: 7.1 g/dL (ref 6.5–8.1)

## 2020-12-05 LAB — CBC
HCT: 46.5 % (ref 39.0–52.0)
Hemoglobin: 15.5 g/dL (ref 13.0–17.0)
MCH: 31.3 pg (ref 26.0–34.0)
MCHC: 33.3 g/dL (ref 30.0–36.0)
MCV: 93.9 fL (ref 80.0–100.0)
Platelets: 179 10*3/uL (ref 150–400)
RBC: 4.95 MIL/uL (ref 4.22–5.81)
RDW: 12.5 % (ref 11.5–15.5)
WBC: 9.9 10*3/uL (ref 4.0–10.5)
nRBC: 0 % (ref 0.0–0.2)

## 2020-12-05 LAB — POC OCCULT BLOOD, ED: Fecal Occult Bld: NEGATIVE

## 2020-12-05 LAB — LIPASE, BLOOD: Lipase: 48 U/L (ref 11–51)

## 2020-12-05 MED ORDER — DICYCLOMINE HCL 20 MG PO TABS: 20.0000 mg | ORAL_TABLET | Freq: Two times a day (BID) | ORAL | 0 refills | Status: DC | PRN

## 2020-12-05 NOTE — ED Provider Notes (Signed)
[facility] EMERGENCY DEPARTMENT Provider Note   CSN: [id number] Arrival date & time: 12/05/20  1347     History Chief Complaint  Patient presents with   Abdominal Pain    Sean Villarreal is a 80 y.o. male.  HPI  Patient with a significant medical history of CAD's status post CABG 2021, hypertension, A. fib currently not on anticoagulant, type 2 diabetes presents with chief complaint of left lower quadrant pain.  Pain has been going on for last 2 weeks, states the pain is intermittent, worsened after eating and improves after bowel movements, states he has intermittent diarrhea and soft stools denies melena or hematochezia, denies any urinary symptoms, flank pain, side pain or vomiting.  He denies  fevers or chills, congestion, sore throat, cough, general body aches.  He has no significant abdominal history, had a colonoscopy back in 2018 shows diverticulosis, states he went to his primary care office last week they started him on Flagyl and Cipro he has been taking this but still has pain, he went to the primary care today and he was told to come here for further evaluation.  Past Medical History:  Diagnosis Date   Arthritis    Basal cell carcinoma    Coronary artery disease    a. DES to LAD and LCx April 2014 b. PTCA ostial circumflex 03/2016 due to ISR c.  9/18 PCI/DESx2 osital Lcx (ISR), p/mLCx  d. s/p CABG on 01/24/2020 with LIMA-LAD and SVG-OM.   Dyslipidemia    Essential hypertension    GERD (gastroesophageal reflux disease)    Hematuria    History of blood transfusion 09/2012   History of kidney stones    NSTEMI (non-ST elevated myocardial infarction) ([place]) 08/2012   Type II diabetes mellitus ([place])     Patient Active Problem List   Diagnosis Date Noted   Hypomagnesemia 01/21/2020   Class 1 obesity 01/21/2020   Unstable angina ([place]) 01/21/2020   Chest pain 01/20/2020   Non-ST elevation (NSTEMI) myocardial infarction ([place]) 10/11/2016   Guaiac positive stools  02/23/2016   Preop cardiovascular exam 11/05/2013   GERD (gastroesophageal reflux disease) 05/23/2013   Ejection fraction    Edema    Shortness of breath    Hematuria    Nephrolithiasis    Hypokalemia 05/14/2012   Dyslipidemia 05/14/2012   Hypertension    Coronary artery disease 05/06/2012   Type II diabetes mellitus ([place]) 05/06/2012    Past Surgical History:  Procedure Laterality Date   BASAL CELL CARCINOMA EXCISION     "right cheek; both shoulders"   CARDIAC CATHETERIZATION     CATARACT EXTRACTION W/ INTRAOCULAR LENS  IMPLANT, BILATERAL Bilateral    COLONOSCOPY N/A 03/19/2016   Procedure: COLONOSCOPY;  Surgeon: Rogene Houston, MD;  [facility];  Service: Endoscopy;  Laterality: N/A;  9:15   CORONARY ARTERY BYPASS GRAFT N/A 01/24/2020   Procedure: CORONARY ARTERY BYPASS GRAFTING (CABG), ON PUMP, TIMES TWO, USING LEFT INTERNAL MAMMARY ARTERY AND ENDOSCOPICALLY HARVESTED RIGHT GREATER SAPHENOUS VEIN;  Surgeon: Melrose Nakayama, MD;  Location: [place];  Service: Open Heart Surgery;  Laterality: N/A;   CORONARY BALLOON ANGIOPLASTY N/A 04/15/2016   Procedure: Coronary Balloon Angioplasty;  Surgeon: Peter M Martinique, MD;  Location: [place] [facility] LAB;  Service: Cardiovascular;  Laterality: N/A;   CORONARY STENT INTERVENTION N/A 10/11/2016   Procedure: CORONARY STENT INTERVENTION;  Surgeon: Jettie Booze, MD;  Location: [place] [facility];  Service: Cardiovascular;  Laterality: N/A;   CYSTOSCOPY  W/ URETEROSCOPY W/ LITHOTRIPSY  10/2012   /notes 11/10/2012   CYSTOSCOPY WITH STENT PLACEMENT  08/2012; 09/2012   EYE SURGERY Left ~ 02/2016   "for crinkled lens"   LEFT HEART CATH AND CORONARY ANGIOGRAPHY N/A 04/15/2016   Procedure: Left Heart Cath and Coronary Angiography;  Surgeon: Peter M Martinique, MD;  Location: [place] CV LAB;  Service: Cardiovascular;  Laterality: N/A;   LEFT HEART CATH AND CORONARY ANGIOGRAPHY N/A 10/11/2016   Procedure: LEFT HEART CATH AND CORONARY  ANGIOGRAPHY;  Surgeon: Jettie Booze, MD;  Location: [place] CV LAB;  Service: Cardiovascular;  Laterality: N/A;   LEFT HEART CATH AND CORONARY ANGIOGRAPHY N/A 03/31/2018   Procedure: LEFT HEART CATH AND CORONARY ANGIOGRAPHY;  Surgeon: Troy Sine, MD;  Location: [place] [facility] LAB;  Service: Cardiovascular;  Laterality: N/A;   LEFT HEART CATH AND CORONARY ANGIOGRAPHY N/A 01/21/2020   Procedure: LEFT HEART CATH AND CORONARY ANGIOGRAPHY;  Surgeon: Belva Crome, MD;  Location: [place] [facility] LAB;  Service: Cardiovascular;  Laterality: N/A;   LEFT HEART CATHETERIZATION WITH CORONARY ANGIOGRAM N/A 05/05/2012   Procedure: LEFT HEART CATHETERIZATION WITH CORONARY ANGIOGRAM;  Surgeon: Burnell Blanks, MD;  Location: [facility] CATH LAB;  Service: Cardiovascular;  Laterality: N/A;   LEFT HEART CATHETERIZATION WITH CORONARY ANGIOGRAM N/A 05/15/2012   Procedure: LEFT HEART CATHETERIZATION WITH CORONARY ANGIOGRAM;  Surgeon: Burnell Blanks, MD;  Location: [facility] CATH LAB;  Service: Cardiovascular;  Laterality: N/A;   TEE WITHOUT CARDIOVERSION N/A 01/24/2020   Procedure: TRANSESOPHAGEAL ECHOCARDIOGRAM (TEE);  Surgeon: Melrose Nakayama, MD;  Location: [place];  Service: Open Heart Surgery;  Laterality: N/A;   TONSILLECTOMY  1947       Family History  Problem Relation Age of Onset   Heart disease Mother    Heart failure Mother    Heart disease Father    Heart attack Father    Colon cancer Neg Hx     Social History   Tobacco Use   Smoking status: Former    Packs/day: 0.75    Years: 30.00    Pack years: 22.50    Types: Cigarettes    Start date: 01/25/1982    Quit date: 11/05/1988    Years since quitting: 32.1   Smokeless tobacco: Never  Vaping Use   Vaping Use: Never used  Substance Use Topics   Alcohol use: Not Currently    Alcohol/week: 0.0 standard drinks   Drug use: No    Home Medications Prior to Admission medications   Medication Sig Start Date End Date Taking?  Authorizing Provider  dicyclomine (BENTYL) 20 MG tablet Take 1 tablet (20 mg total) by mouth 2 (two) times daily as needed for spasms. 12/05/20  Yes Marcello Fennel, PA-C  amLODipine (NORVASC) 10 MG tablet Take 1 tablet once daily or as directed. 02/25/20   Barrett, Evelene Croon, PA-C  aspirin 81 MG tablet Take 81 mg by mouth daily.    [provider]  atorvastatin (LIPITOR) 80 MG tablet Take 80 mg by mouth at bedtime. 05/06/12   Edmisten, Azzie Roup, PA-C  carvedilol (COREG) 25 MG tablet Take 1 tablet (25 mg total) by mouth 2 (two) times daily with a meal. 05/15/12   Barrett, Evelene Croon, PA-C  furosemide (LASIX) 20 MG tablet Take 1 tablet (20 mg total) by mouth every other day. 09/30/20   Satira Sark, MD  glimepiride (AMARYL) 1 MG tablet Take 1 mg by mouth daily with breakfast.  [provider]  lisinopril (ZESTRIL) 40 MG tablet Take 40 mg by mouth daily.    [provider]  metFORMIN (GLUCOPHAGE) 1000 MG tablet Take 1,000 mg by mouth 2 (two) times daily with a meal.    [provider]  nitroGLYCERIN (NITROSTAT) 0.4 MG SL tablet Place 0.4 mg under the tongue every 5 (five) minutes as needed for chest pain.    [provider]  potassium chloride SA (KLOR-CON M20) 20 MEQ tablet Take 2 tablets (40 mEq total) by mouth every other day. 09/30/20   Satira Sark, MD  psyllium (METAMUCIL SMOOTH TEXTURE) 28 % packet Take 1 packet by mouth 2 (two) times daily.    [provider]  tamsulosin (FLOMAX) 0.4 MG CAPS capsule Take 0.4 mg by mouth.    [provider]  traMADol (ULTRAM) 50 MG tablet Take by mouth every 6 (six) hours as needed.    [provider]  amiodarone (PACERONE) 200 MG tablet Take 1 tablet (200 mg total) by mouth daily. 02/13/20 03/24/20  Ahmed Prima, Fransisco Hertz, PA-C  apixaban (ELIQUIS) 5 MG TABS tablet Take 1 tablet (5 mg total) by mouth 2 (two) times daily. 02/13/20 03/24/20  Erma Heritage, PA-C    Allergies     Contrast media [iodinated diagnostic agents], Penicillins, and Jardiance [empagliflozin]  Review of Systems   Review of Systems  Constitutional:  Negative for chills and fever.  HENT:  Negative for congestion.   Respiratory:  Negative for shortness of breath.   Cardiovascular:  Negative for chest pain.  Gastrointestinal:  Positive for abdominal pain, diarrhea and nausea. Negative for vomiting.  Genitourinary:  Negative for dysuria, enuresis, flank pain and frequency.  Musculoskeletal:  Negative for back pain.  Skin:  Negative for rash.  Neurological:  Negative for dizziness.  Hematological:  Does not bruise/bleed easily.   Physical Exam Updated Vital Signs BP (!) 147/84   Pulse 84   Temp 97.8 F (36.6 C) (Oral)   Resp 14   Wt 91.9 kg   SpO2 99%   BMI 30.81 kg/m   Physical Exam Vitals and nursing note reviewed.  Constitutional:      General: He is not in acute distress.    Appearance: He is not ill-appearing.  HENT:     Head: Normocephalic and atraumatic.     Nose: No congestion.  Eyes:     Conjunctiva/sclera: Conjunctivae normal.  Cardiovascular:     Rate and Rhythm: Normal rate and regular rhythm.     Pulses: Normal pulses.     Heart sounds: No murmur heard.   No friction rub. No gallop.  Pulmonary:     Effort: No respiratory distress.     Breath sounds: No wheezing, rhonchi or rales.  Abdominal:     Palpations: Abdomen is soft.     Tenderness: There is abdominal tenderness. There is no right CVA tenderness or left CVA tenderness.     Comments: 9 abdomen nondistended, normal bowel sounds, dull to percussion, he had slight tenderness to palpation in his left lower quadrant, no guarding, rebound times, peritoneal sign, he had no flank tenderness, no CVA tenderness present.  Musculoskeletal:     Right lower leg: No edema.     Left lower leg: No edema.  Skin:    General: Skin is warm and dry.  Neurological:     Mental Status: He is alert.  Psychiatric:         Mood and Affect: Mood normal.  ED Results / Procedures / Treatments   Labs (all labs ordered are listed, but only abnormal results are displayed) Labs Reviewed  COMPREHENSIVE METABOLIC PANEL - Abnormal; Notable for the following components:      Result Value   Glucose, Bld 146 (*)    Total Bilirubin 1.4 (*)    All other components within normal limits  URINALYSIS, ROUTINE W REFLEX MICROSCOPIC - Abnormal; Notable for the following components:   Hgb urine dipstick SMALL (*)    Ketones, ur 20 (*)    Leukocytes,Ua TRACE (*)    All other components within normal limits  LIPASE, BLOOD  CBC  POC OCCULT BLOOD, ED    EKG None  Radiology CT ABDOMEN PELVIS WO CONTRAST  Result Date: 12/05/2020 CLINICAL DATA:  Diverticulitis suspected. Left lower quadrant pain and diarrhea. EXAM: CT ABDOMEN AND PELVIS WITHOUT CONTRAST TECHNIQUE: Multidetector CT imaging of the abdomen and pelvis was performed following the standard protocol without IV contrast. COMPARISON:  None. FINDINGS: Lower chest: No acute abnormality. Hepatobiliary: No focal liver abnormality is seen. No gallstones, gallbladder wall thickening, or biliary dilatation. Pancreas: There is a rounded lesion in the neck of the pancreas measuring up to 18 mm, likely cystic. There is a rounded lesion in the head of the pancreas measuring 15 mm, likely cystic. There is no bile duct dilatation or surrounding inflammation. Spleen: Normal in size without focal abnormality. Adrenals/Urinary Tract: Adrenal glands are within normal limits. Kidneys and mid/proximal ureters are within normal limits. The bladder and distal ureters are not well seen secondary to streak artifact in the pelvis, but appear grossly within normal limits. Stomach/Bowel: Stomach is within normal limits. Appendix appears normal. No evidence of bowel wall thickening, distention, or inflammatory changes. There is sigmoid colon diverticulosis without evidence for acute diverticulitis.  Vascular/Lymphatic: Aortic atherosclerosis. No enlarged abdominal or pelvic lymph nodes. Reproductive: Prostate is unremarkable. Other: There is a large fat containing left inguinal hernia. There is no free fluid. Musculoskeletal: No acute fractures are seen. Degenerative changes affect the spine. Sternotomy wires are present. There is a right hip arthroplasty. IMPRESSION: 1. Large fat containing left inguinal hernia. 2. Sigmoid colon diverticulosis without evidence for acute diverticulitis. 3. There are 2 low-attenuation lesions in the pancreas which are incompletely characterize, but likely cystic. Recommend further evaluation with MRI. 4.  Aortic Atherosclerosis (ICD10-I70.0). Electronically Signed   By: Ronney Asters M.D.   On: 12/05/2020 17:10    Procedures Procedures   Medications Ordered in ED Medications - No data to display  ED Course  I have reviewed the triage vital signs and the nursing notes.  Pertinent labs & imaging results that were available during my care of the patient were reviewed by me and considered in my medical decision making (see chart for details).    MDM Rules/Calculators/A&P                          Initial impression-presents with left lower quadrant pain.  He is alert, does not appear in acute stress, vital signs are reassuring.  Unclear etiology will obtain basic lab work-up and reassess.  Work-up-CBC unremarkable, CMP shows hyperglycemia 146, T bili 1.4, UA shows trace leukocytes, no red blood cells white blood cells or bacteria, Hemoccult is negative.  Lipase is 48.  CT abdomen pelvis reveals large fat-containing left inguinal hernia, sigmoid diverticulosis without evidence of diverticulitis, too low agitated lesions in the pancreas which are incomplete characterize recommend MRI for further  eval.  Rule out-low suspicion for systemic infection as patient is nontoxic-appearing, vital signs are reassuring, no leukocytosis on CBC.  I have low suspicion for  pancreatitis as lipase is within normal limits.  low suspicion for UTI, [place], kidney stone UA is negative for signs of infection, hematuria, no flank tenderness or CVA tenderness.  I have low suspicion for bowel obstruction, complicated diverticulitis, intra-abdominal infection, CT imaging is negative for these findings.  Low suspicion for dissection of the aorta as presentation a atypical of etiology.  Low suspicion for incarcerated hernia as he has no significant abdominal tenderness, no leukocytosis, he is nontoxic-appearing, still passing gas and having normal bowel movements.  Plan-  Left-sided abdominal pain-likely secondary due to the left inguinal hernia, will have him follow-up with general surgery for further evaluation.  Given strict return precautions. Lesion seen in pancreas-we will have him follow-up with PCP for further evaluation.  Vital signs have remained stable, no indication for hospital admission.   Patient given at home care as well strict return precautions.  Patient verbalized that they understood agreed to said plan.  Final Clinical Impression(s) / ED Diagnoses Final diagnoses:  Left lower quadrant abdominal pain  Unilateral inguinal hernia without obstruction or gangrene, recurrence not specified    Rx / DC Orders ED Discharge Orders          Ordered    dicyclomine (BENTYL) 20 MG tablet  2 times daily PRN        12/05/20 1744             Marcello Fennel, PA-C 12/05/20 1745    Davonna Belling, MD 12/06/20 1025

## 2020-12-05 NOTE — ED Triage Notes (Signed)
Abdominal pain

## 2020-12-05 NOTE — Discharge Instructions (Addendum)
Imaging shows that you have a left-sided hernia, this is most likely the cause of your pain.  I would like you to follow-up with general surgery for further evaluation given contact information above.  if you start to have severe stomach pain uncontrolled nausea, vomiting unable to have bowel movements or pass gas you must come back for further evaluation.  An incidental finding, CT scan found some lesions in your pancreas likely these are benign  cysts, recommend further follow-up with an MRI please schedule this with your primary care provider.  Come back to the emergency department if you develop chest pain, shortness of breath, severe abdominal pain, uncontrolled nausea, vomiting, diarrhea.

## 2020-12-16 ENCOUNTER — Other Ambulatory Visit: Payer: Self-pay

## 2020-12-16 ENCOUNTER — Encounter: Payer: Self-pay | Admitting: General Surgery

## 2020-12-16 ENCOUNTER — Ambulatory Visit (INDEPENDENT_AMBULATORY_CARE_PROVIDER_SITE_OTHER): Payer: Medicare Other | Admitting: General Surgery

## 2020-12-16 VITALS — BP 160/91 | HR 86 | Temp 98.3°F | Resp 16 | Ht 69.0 in | Wt 205.0 lb

## 2020-12-16 DIAGNOSIS — K4090 Unilateral inguinal hernia, without obstruction or gangrene, not specified as recurrent: Secondary | ICD-10-CM | POA: Insufficient documentation

## 2020-12-16 NOTE — Patient Instructions (Signed)
Open Hernia Repair, Adult °Open hernia repair is a surgical procedure to fix a hernia. A hernia occurs when an internal organ or tissue pushes through a weak spot in the muscles along the wall of the abdomen. Hernias commonly occur in the groin and around the belly button. °Most hernias tend to get worse over time. Often, surgery is done to prevent the hernia from becoming bigger, uncomfortable, or an emergency. Emergency surgery may be needed if contents of the abdomen get stuck in the opening (incarcerated hernia) or if the blood supply gets cut off (strangulated hernia). In an open repair, an incision is made in the abdomen to perform the surgery. °Tell a health care provider about: °Any allergies you have. °All medicines you are taking, including vitamins, herbs, eye drops, creams, and over-the-counter medicines. °Any problems you or family members have had with anesthetic medicines. °Any blood or bone disorders you have. °Any surgeries you have had. °Any medical conditions you have, including any recent cold or flu (influenza)symptoms. °Whether you are pregnant or may be pregnant. °What are the risks? °Generally, this is a safe procedure. However, problems may occur, including: °Long-lasting (chronic) pain. °Bleeding. °Infection. °Damage to the testicles. This can cause shrinking or swelling. °Damage to nearby structures or organs, including the bladder, blood vessels, intestines, or nerves near the hernia. °Blood clots. °Trouble passing urine. °Return of the hernia. °What happens before the procedure? °Medicines °Ask your health care provider about: °Changing or stopping your regular medicines. This is especially important if you are taking diabetes medicines or blood thinners. °Taking medicines such as aspirin and ibuprofen. These medicines can thin your blood. Do not take these medicines unless your health care provider tells you to take them. °Taking over-the-counter medicines, vitamins, herbs, and  supplements. °Surgery safety °Ask your health care provider: °How your surgery site will be marked. °What steps will be taken to help prevent infection. These steps may include: °Removing hair at the surgery site. °Washing skin with a germ-killing soap. °Receiving antibiotic medicine. °General instructions °You may have an exam or testing, such as blood tests or imaging studies. °Do not use any products that contain nicotine or tobacco for at least 4 weeks before the procedure. These products include cigarettes, chewing tobacco, and vaping devices, such as e-cigarettes. If you need help quitting, ask your health care provider. °Let your health care provider know if you develop a cold or any infection before your surgery. If you get an infection before surgery, you may receive antibiotics to treat it. °Plan to have a responsible adult take you home from the hospital or clinic. °If you will be going home right after the procedure, plan to have a responsible adult care for you for the time you are told. This is important. °What happens during the procedure? ° °An IV will be inserted into one of your veins. °You will be given one or more of the following: °A medicine to help you relax (sedative). °A medicine to numb the area (local anesthetic). °A medicine to make you fall asleep (general anesthetic). °Your surgeon will make an incision over the hernia. °The tissues of the hernia will be moved back into place. °The edges of the hernia may be stitched (sutured) together. °The opening in the abdominal muscles will be closed with stitches (sutures). Or, your surgeon will place a mesh patch made of artificial (synthetic) material over the opening. °The incision will be closed with sutures, skin glue, or adhesive strips. °A bandage (dressing) may be   placed over the incision. °The procedure may vary among health care providers and hospitals. °What happens after the procedure? °Your blood pressure, heart rate, breathing rate,  and blood oxygen level will be monitored until you leave the hospital or clinic. °You may be given medicine for pain. °If you were given a sedative during the procedure, it can affect you for several hours. Do not drive or operate machinery until your health care provider says that it is safe. °Summary °Open hernia repair is a surgical procedure to fix a hernia. Hernias commonly occur in the groin and around the belly button. °Emergency surgery may be needed if contents of the abdomen get stuck in the opening (incarcerated hernia) or if the blood supply gets cut off (strangulated hernia). °In this procedure, an incision is made in the abdomen to perform the surgery. °After the procedure, you may be given medicine for pain. °This information is not intended to replace advice given to you by your health care provider. Make sure you discuss any questions you have with your health care provider. °Document Revised: 08/27/2019 Document Reviewed: 08/27/2019 °Elsevier Patient Education © 2022 [employer] ° °

## 2020-12-16 NOTE — Progress Notes (Signed)
[facility] History and Physical  Reason for Referral: Left inguinal hernia  Referring Physician: ED   Chief Complaint   New Patient (Initial Visit)     Sean Villarreal is a 80 y.o. male.  HPI: Sean Villarreal is a 80 yo who has prior CAD s./p stents and CABG. He has been having left groin and side pain and was seen in the ED. His PCP was worried about diverticulitis given his pain. CT done in the ED identified a large fat containing left inguinal hernia. He has noticed a bulge and more pain at night after he has been up doing work. 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 If he lays down he is more comfortable and by morning has no symptoms. The bulge has not been hard. He has no obstructive symptoms.  He has no chest pain or SOB. Since his CABG he has had no issues and is stable. He saw cardiology 09/2020 and was doing great.   Past Medical History:  Diagnosis Date   Arthritis    Basal cell carcinoma    Coronary artery disease    a. DES to LAD and LCx April 2014 b. PTCA ostial circumflex 03/2016 due to ISR c.  9/18 PCI/DESx2 osital Lcx (ISR), p/mLCx  d. s/p CABG on 01/24/2020 with LIMA-LAD and SVG-OM.   Dyslipidemia    Essential hypertension    GERD (gastroesophageal reflux disease)    Hematuria    History of blood transfusion 09/2012   History of kidney stones    NSTEMI (non-ST elevated myocardial infarction) ([place]) 08/2012   Type II diabetes mellitus ([place])     Past Surgical History:  Procedure Laterality Date   BASAL CELL CARCINOMA EXCISION     "right cheek; both shoulders"   CARDIAC CATHETERIZATION     CATARACT EXTRACTION W/ INTRAOCULAR LENS  IMPLANT, BILATERAL Bilateral    COLONOSCOPY N/A 03/19/2016   Procedure: COLONOSCOPY;  Surgeon: Rogene Houston, MD;  [facility];  Service: Endoscopy;  Laterality: N/A;  9:15   CORONARY ARTERY BYPASS GRAFT N/A 01/24/2020   Procedure: CORONARY ARTERY BYPASS GRAFTING  (CABG), ON PUMP, TIMES TWO, USING LEFT INTERNAL MAMMARY ARTERY AND ENDOSCOPICALLY HARVESTED RIGHT GREATER SAPHENOUS VEIN;  Surgeon: Melrose Nakayama, MD;  Location: [place];  Service: Open Heart Surgery;  Laterality: N/A;   CORONARY BALLOON ANGIOPLASTY N/A 04/15/2016   Procedure: Coronary Balloon Angioplasty;  Surgeon: Peter M Martinique, MD;  Location: [place] [facility];  Service: Cardiovascular;  Laterality: N/A;   CORONARY STENT INTERVENTION N/A 10/11/2016   Procedure: CORONARY STENT INTERVENTION;  Surgeon: Jettie Booze, MD;  Location: [place] [facility];  Service: Cardiovascular;  Laterality: N/A;   CYSTOSCOPY W/ URETEROSCOPY W/ LITHOTRIPSY  10/2012   /notes 11/10/2012   CYSTOSCOPY WITH STENT PLACEMENT  08/2012; 09/2012   EYE SURGERY Left ~ 02/2016   "for crinkled lens"   LEFT HEART CATH AND CORONARY ANGIOGRAPHY N/A 04/15/2016   Procedure: Left Heart Cath and Coronary Angiography;  Surgeon: Peter M Martinique, MD;  Location: [place] [facility];  Service: Cardiovascular;  Laterality: N/A;   LEFT HEART CATH AND CORONARY ANGIOGRAPHY N/A 10/11/2016   Procedure: LEFT HEART CATH AND CORONARY ANGIOGRAPHY;  Surgeon: Jettie Booze, MD;  Location: [place] [facility];  Service: Cardiovascular;  Laterality: N/A;   LEFT HEART CATH AND CORONARY ANGIOGRAPHY N/A 03/31/2018   Procedure: LEFT HEART CATH AND CORONARY ANGIOGRAPHY;  Surgeon: Troy Sine, MD;  Location: [place] [facility];  Service: Cardiovascular;  Laterality: N/A;   LEFT HEART CATH AND CORONARY ANGIOGRAPHY N/A 01/21/2020   Procedure: LEFT HEART CATH AND CORONARY ANGIOGRAPHY;  Surgeon: Belva Crome, MD;  Location: [place] [facility];  Service: Cardiovascular;  Laterality: N/A;   LEFT HEART CATHETERIZATION WITH CORONARY ANGIOGRAM N/A 05/05/2012   Procedure: LEFT HEART CATHETERIZATION WITH CORONARY ANGIOGRAM;  Surgeon: Burnell Blanks, MD;  Location: [facility] CATH LAB;  Service: Cardiovascular;  Laterality: N/A;   LEFT HEART CATHETERIZATION  WITH CORONARY ANGIOGRAM N/A 05/15/2012   Procedure: LEFT HEART CATHETERIZATION WITH CORONARY ANGIOGRAM;  Surgeon: Burnell Blanks, MD;  Location: [facility] CATH LAB;  Service: Cardiovascular;  Laterality: N/A;   TEE WITHOUT CARDIOVERSION N/A 01/24/2020   Procedure: TRANSESOPHAGEAL ECHOCARDIOGRAM (TEE);  Surgeon: Melrose Nakayama, MD;  Location: [place];  Service: Open Heart Surgery;  Laterality: N/A;   TONSILLECTOMY  1947    Family History  Problem Relation Age of Onset   Heart disease Mother    Heart failure Mother    Heart disease Father    Heart attack Father    Colon cancer Neg Hx     Social History   Tobacco Use   Smoking status: Former    Packs/day: 0.75    Years: 30.00    Pack years: 22.50    Types: Cigarettes    Start date: 01/25/1982    Quit date: 11/05/1988    Years since quitting: 32.1   Smokeless tobacco: Never  Vaping Use   Vaping Use: Never used  Substance Use Topics   Alcohol use: Not Currently    Alcohol/week: 0.0 standard drinks   Drug use: No    Medications: I have reviewed the patient's current medications. Allergies as of 12/16/2020       Reactions   Contrast Media [iodinated Diagnostic Agents] Hives, Other (See Comments)   Had "welts" on arm, and kidney issues after given dye in the past   Penicillins Other (See Comments)   Passed out as a child Has patient had a PCN reaction causing immediate rash, facial/tongue/throat swelling, SOB or lightheadedness with hypotension: Unk Has patient had a PCN reaction causing severe rash involving mucus membranes or skin necrosis: Unk Has patient had a PCN reaction that required hospitalization: Unk Has patient had a PCN reaction occurring within the last 10 years: No If all of the above answers are "NO", then may proceed with Cephalosporin use.   Jardiance [empagliflozin] Diarrhea        Medication List        Accurate as of December 16, 2020 10:58 AM. If you have any questions, ask your nurse or  doctor.          STOP taking these medications    traMADol 50 MG tablet Commonly known as: ULTRAM Stopped by: Virl Cagey, MD       TAKE these medications    amLODipine 10 MG tablet Commonly known as: NORVASC Take 1 tablet once daily or as directed.   aspirin 81 MG tablet Take 81 mg by mouth daily.   atorvastatin 80 MG tablet Commonly known as: LIPITOR Take 80 mg by mouth at bedtime.   carvedilol 25 MG tablet Commonly known as: COREG Take 1 tablet (25 mg total) by mouth 2 (two) times daily with a meal.   dicyclomine 20 MG tablet Commonly known as: BENTYL Take 1 tablet (20 mg total) by mouth 2 (two) times daily  as needed for spasms.   furosemide 20 MG tablet Commonly known as: LASIX Take 1 tablet (20 mg total) by mouth every other day.   glimepiride 1 MG tablet Commonly known as: AMARYL Take 1 mg by mouth daily with breakfast.   lisinopril 40 MG tablet Commonly known as: ZESTRIL Take 40 mg by mouth daily.   metFORMIN 1000 MG tablet Commonly known as: GLUCOPHAGE Take 1,000 mg by mouth 2 (two) times daily with a meal.   nitroGLYCERIN 0.4 MG SL tablet Commonly known as: NITROSTAT Place 0.4 mg under the tongue every 5 (five) minutes as needed for chest pain.   potassium chloride SA 20 MEQ tablet Commonly known as: Klor-Con M20 Take 2 tablets (40 mEq total) by mouth every other day.   psyllium 28 % packet Commonly known as: METAMUCIL SMOOTH TEXTURE Take 1 packet by mouth 2 (two) times daily.   tamsulosin 0.4 MG Caps capsule Commonly known as: FLOMAX Take 0.4 mg by mouth.         ROS:  A comprehensive review of systems was negative except for: Gastrointestinal: positive for abdominal pain  Blood pressure (!) 160/91, pulse 86, temperature 98.3 F (36.8 C), temperature source Other (Comment), resp. rate 16, height 5\' 9"  (1.753 m), weight 205 lb (93 kg), SpO2 98 %. Physical Exam Vitals reviewed.  Constitutional:      Appearance: Normal  appearance.  HENT:     Head: Normocephalic.     Nose: Nose normal.     Mouth/Throat:     Mouth: Mucous membranes are moist.  Eyes:     Extraocular Movements: Extraocular movements intact.  Cardiovascular:     Rate and Rhythm: Normal rate and regular rhythm.  Pulmonary:     Effort: Pulmonary effort is normal.  Abdominal:     General: There is no distension.     Palpations: Abdomen is soft.     Tenderness: There is no abdominal tenderness.     Hernia: A hernia is present. Hernia is present in the left inguinal area.     Comments: Reducible hernia   Musculoskeletal:        General: Normal range of motion.     Cervical back: Normal range of motion.  Skin:    General: Skin is warm.  Neurological:     General: No focal deficit present.     Mental Status: He is alert and oriented to person, place, and time.  Psychiatric:        Mood and Affect: Mood normal.        Behavior: Behavior normal.        Thought Content: Thought content normal.        Judgment: Judgment normal.    Results:  CLINICAL DATA:  Diverticulitis suspected. Left lower quadrant pain and diarrhea.   EXAM: CT ABDOMEN AND PELVIS WITHOUT CONTRAST   TECHNIQUE: Multidetector CT imaging of the abdomen and pelvis was performed following the standard protocol without IV contrast.   COMPARISON:  None.   FINDINGS: Lower chest: No acute abnormality.   Hepatobiliary: No focal liver abnormality is seen. No gallstones, gallbladder wall thickening, or biliary dilatation.   Pancreas: There is a rounded lesion in the neck of the pancreas measuring up to 18 mm, likely cystic. There is a rounded lesion in the head of the pancreas measuring 15 mm, likely cystic. There is no bile duct dilatation or surrounding inflammation.   Spleen: Normal in size without focal abnormality.   Adrenals/Urinary Tract: Adrenal glands  are within normal limits. Kidneys and mid/proximal ureters are within normal limits. The bladder and  distal ureters are not well seen secondary to streak artifact in the pelvis, but appear grossly within normal limits.   Stomach/Bowel: Stomach is within normal limits. Appendix appears normal. No evidence of bowel wall thickening, distention, or inflammatory changes. There is sigmoid colon diverticulosis without evidence for acute diverticulitis.   Vascular/Lymphatic: Aortic atherosclerosis. No enlarged abdominal or pelvic lymph nodes.   Reproductive: Prostate is unremarkable.   Other: There is a large fat containing left inguinal hernia. There is no free fluid.   Musculoskeletal: No acute fractures are seen. Degenerative changes affect the spine. Sternotomy wires are present. There is a right hip arthroplasty.   IMPRESSION: 1. Large fat containing left inguinal hernia. 2. Sigmoid colon diverticulosis without evidence for acute diverticulitis. 3. There are 2 low-attenuation lesions in the pancreas which are incompletely characterize, but likely cystic. Recommend further evaluation with MRI. 4.  Aortic Atherosclerosis (ICD10-I70.0).     Electronically Signed   By: Ronney Asters M.D.   On: 12/05/2020 17:10  Assessment & Plan:  Sean Villarreal is a 80 y.o. male with left inguinal hernia that is symptomatic. He wants to proceed with repair. Dr. Domenic Polite cardiology, says the patient is doing well from a cardiac standpoint.   Discussed the risk and benefits including, bleeding, infection, use of mesh, risk of recurrence, risk of nerve damage causing numbness or changes in sensation, risk of damage to the cord structures. The patient understands the risk and benefits of repair with mesh, and has decided to proceed.  We also discussed open versus laparoscopic surgery and the use of mesh. We discussed that I do open repairs with mesh, and that this is considered equivalent to laparoscopic surgery. We discussed reasons for opting for laparoscopic surgery including if a bilateral repair  is needed or if a patient has a recurrence after an open repair.   All questions were answered to the satisfaction of the patient.    Virl Cagey 12/16/2020, 10:58 AM

## 2020-12-16 NOTE — H&P (View-Only) (Signed)
[facility] History and Physical  Reason for Referral: Left inguinal hernia  Referring Physician: ED   Chief Complaint   New Patient (Initial Visit)     Sean Villarreal is a 80 y.o. male.  HPI: Sean Villarreal is a 80 yo who has prior CAD s./p stents and CABG. He has been having left groin and side pain and was seen in the ED. His PCP was worried about diverticulitis given his pain. CT done in the ED identified a large fat containing left inguinal hernia. He has noticed a bulge and more pain at night after he has been up doing work. 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 If he lays down he is more comfortable and by morning has no symptoms. The bulge has not been hard. He has no obstructive symptoms.  He has no chest pain or SOB. Since his CABG he has had no issues and is stable. He saw cardiology 09/2020 and was doing great.   Past Medical History:  Diagnosis Date   Arthritis    Basal cell carcinoma    Coronary artery disease    a. DES to LAD and LCx April 2014 b. PTCA ostial circumflex 03/2016 due to ISR c.  9/18 PCI/DESx2 osital Lcx (ISR), p/mLCx  d. s/p CABG on 01/24/2020 with LIMA-LAD and SVG-OM.   Dyslipidemia    Essential hypertension    GERD (gastroesophageal reflux disease)    Hematuria    History of blood transfusion 09/2012   History of kidney stones    NSTEMI (non-ST elevated myocardial infarction) ([place]) 08/2012   Type II diabetes mellitus ([place])     Past Surgical History:  Procedure Laterality Date   BASAL CELL CARCINOMA EXCISION     "right cheek; both shoulders"   CARDIAC CATHETERIZATION     CATARACT EXTRACTION W/ INTRAOCULAR LENS  IMPLANT, BILATERAL Bilateral    COLONOSCOPY N/A 03/19/2016   Procedure: COLONOSCOPY;  Surgeon: Rogene Houston, MD;  [facility];  Service: Endoscopy;  Laterality: N/A;  9:15   CORONARY ARTERY BYPASS GRAFT N/A 01/24/2020   Procedure: CORONARY ARTERY BYPASS GRAFTING  (CABG), ON PUMP, TIMES TWO, USING LEFT INTERNAL MAMMARY ARTERY AND ENDOSCOPICALLY HARVESTED RIGHT GREATER SAPHENOUS VEIN;  Surgeon: Melrose Nakayama, MD;  Location: [place];  Service: Open Heart Surgery;  Laterality: N/A;   CORONARY BALLOON ANGIOPLASTY N/A 04/15/2016   Procedure: Coronary Balloon Angioplasty;  Surgeon: Peter M Martinique, MD;  Location: [place] [facility];  Service: Cardiovascular;  Laterality: N/A;   CORONARY STENT INTERVENTION N/A 10/11/2016   Procedure: CORONARY STENT INTERVENTION;  Surgeon: Jettie Booze, MD;  Location: [place] [facility];  Service: Cardiovascular;  Laterality: N/A;   CYSTOSCOPY W/ URETEROSCOPY W/ LITHOTRIPSY  10/2012   /notes 11/10/2012   CYSTOSCOPY WITH STENT PLACEMENT  08/2012; 09/2012   EYE SURGERY Left ~ 02/2016   "for crinkled lens"   LEFT HEART CATH AND CORONARY ANGIOGRAPHY N/A 04/15/2016   Procedure: Left Heart Cath and Coronary Angiography;  Surgeon: Peter M Martinique, MD;  Location: [place] [facility];  Service: Cardiovascular;  Laterality: N/A;   LEFT HEART CATH AND CORONARY ANGIOGRAPHY N/A 10/11/2016   Procedure: LEFT HEART CATH AND CORONARY ANGIOGRAPHY;  Surgeon: Jettie Booze, MD;  Location: [place] [facility];  Service: Cardiovascular;  Laterality: N/A;   LEFT HEART CATH AND CORONARY ANGIOGRAPHY N/A 03/31/2018   Procedure: LEFT HEART CATH AND CORONARY ANGIOGRAPHY;  Surgeon: Troy Sine, MD;  Location: [place] [facility];  Service: Cardiovascular;  Laterality: N/A;   LEFT HEART CATH AND CORONARY ANGIOGRAPHY N/A 01/21/2020   Procedure: LEFT HEART CATH AND CORONARY ANGIOGRAPHY;  Surgeon: Belva Crome, MD;  Location: [place] [facility];  Service: Cardiovascular;  Laterality: N/A;   LEFT HEART CATHETERIZATION WITH CORONARY ANGIOGRAM N/A 05/05/2012   Procedure: LEFT HEART CATHETERIZATION WITH CORONARY ANGIOGRAM;  Surgeon: Burnell Blanks, MD;  Location: [facility] CATH LAB;  Service: Cardiovascular;  Laterality: N/A;   LEFT HEART CATHETERIZATION  WITH CORONARY ANGIOGRAM N/A 05/15/2012   Procedure: LEFT HEART CATHETERIZATION WITH CORONARY ANGIOGRAM;  Surgeon: Burnell Blanks, MD;  Location: [facility] CATH LAB;  Service: Cardiovascular;  Laterality: N/A;   TEE WITHOUT CARDIOVERSION N/A 01/24/2020   Procedure: TRANSESOPHAGEAL ECHOCARDIOGRAM (TEE);  Surgeon: Melrose Nakayama, MD;  Location: [place];  Service: Open Heart Surgery;  Laterality: N/A;   TONSILLECTOMY  1947    Family History  Problem Relation Age of Onset   Heart disease Mother    Heart failure Mother    Heart disease Father    Heart attack Father    Colon cancer Neg Hx     Social History   Tobacco Use   Smoking status: Former    Packs/day: 0.75    Years: 30.00    Pack years: 22.50    Types: Cigarettes    Start date: 01/25/1982    Quit date: 11/05/1988    Years since quitting: 32.1   Smokeless tobacco: Never  Vaping Use   Vaping Use: Never used  Substance Use Topics   Alcohol use: Not Currently    Alcohol/week: 0.0 standard drinks   Drug use: No    Medications: I have reviewed the patient's current medications. Allergies as of 12/16/2020       Reactions   Contrast Media [iodinated Diagnostic Agents] Hives, Other (See Comments)   Had "welts" on arm, and kidney issues after given dye in the past   Penicillins Other (See Comments)   Passed out as a child Has patient had a PCN reaction causing immediate rash, facial/tongue/throat swelling, SOB or lightheadedness with hypotension: Unk Has patient had a PCN reaction causing severe rash involving mucus membranes or skin necrosis: Unk Has patient had a PCN reaction that required hospitalization: Unk Has patient had a PCN reaction occurring within the last 10 years: No If all of the above answers are "NO", then may proceed with Cephalosporin use.   Jardiance [empagliflozin] Diarrhea        Medication List        Accurate as of December 16, 2020 10:58 AM. If you have any questions, ask your nurse or  doctor.          STOP taking these medications    traMADol 50 MG tablet Commonly known as: ULTRAM Stopped by: Virl Cagey, MD       TAKE these medications    amLODipine 10 MG tablet Commonly known as: NORVASC Take 1 tablet once daily or as directed.   aspirin 81 MG tablet Take 81 mg by mouth daily.   atorvastatin 80 MG tablet Commonly known as: LIPITOR Take 80 mg by mouth at bedtime.   carvedilol 25 MG tablet Commonly known as: COREG Take 1 tablet (25 mg total) by mouth 2 (two) times daily with a meal.   dicyclomine 20 MG tablet Commonly known as: BENTYL Take 1 tablet (20 mg total) by mouth 2 (two) times daily  as needed for spasms.   furosemide 20 MG tablet Commonly known as: LASIX Take 1 tablet (20 mg total) by mouth every other day.   glimepiride 1 MG tablet Commonly known as: AMARYL Take 1 mg by mouth daily with breakfast.   lisinopril 40 MG tablet Commonly known as: ZESTRIL Take 40 mg by mouth daily.   metFORMIN 1000 MG tablet Commonly known as: GLUCOPHAGE Take 1,000 mg by mouth 2 (two) times daily with a meal.   nitroGLYCERIN 0.4 MG SL tablet Commonly known as: NITROSTAT Place 0.4 mg under the tongue every 5 (five) minutes as needed for chest pain.   potassium chloride SA 20 MEQ tablet Commonly known as: Klor-Con M20 Take 2 tablets (40 mEq total) by mouth every other day.   psyllium 28 % packet Commonly known as: METAMUCIL SMOOTH TEXTURE Take 1 packet by mouth 2 (two) times daily.   tamsulosin 0.4 MG Caps capsule Commonly known as: FLOMAX Take 0.4 mg by mouth.         ROS:  A comprehensive review of systems was negative except for: Gastrointestinal: positive for abdominal pain  Blood pressure (!) 160/91, pulse 86, temperature 98.3 F (36.8 C), temperature source Other (Comment), resp. rate 16, height 5\' 9"  (1.753 m), weight 205 lb (93 kg), SpO2 98 %. Physical Exam Vitals reviewed.  Constitutional:      Appearance: Normal  appearance.  HENT:     Head: Normocephalic.     Nose: Nose normal.     Mouth/Throat:     Mouth: Mucous membranes are moist.  Eyes:     Extraocular Movements: Extraocular movements intact.  Cardiovascular:     Rate and Rhythm: Normal rate and regular rhythm.  Pulmonary:     Effort: Pulmonary effort is normal.  Abdominal:     General: There is no distension.     Palpations: Abdomen is soft.     Tenderness: There is no abdominal tenderness.     Hernia: A hernia is present. Hernia is present in the left inguinal area.     Comments: Reducible hernia   Musculoskeletal:        General: Normal range of motion.     Cervical back: Normal range of motion.  Skin:    General: Skin is warm.  Neurological:     General: No focal deficit present.     Mental Status: He is alert and oriented to person, place, and time.  Psychiatric:        Mood and Affect: Mood normal.        Behavior: Behavior normal.        Thought Content: Thought content normal.        Judgment: Judgment normal.    Results:  CLINICAL DATA:  Diverticulitis suspected. Left lower quadrant pain and diarrhea.   EXAM: CT ABDOMEN AND PELVIS WITHOUT CONTRAST   TECHNIQUE: Multidetector CT imaging of the abdomen and pelvis was performed following the standard protocol without IV contrast.   COMPARISON:  None.   FINDINGS: Lower chest: No acute abnormality.   Hepatobiliary: No focal liver abnormality is seen. No gallstones, gallbladder wall thickening, or biliary dilatation.   Pancreas: There is a rounded lesion in the neck of the pancreas measuring up to 18 mm, likely cystic. There is a rounded lesion in the head of the pancreas measuring 15 mm, likely cystic. There is no bile duct dilatation or surrounding inflammation.   Spleen: Normal in size without focal abnormality.   Adrenals/Urinary Tract: Adrenal glands  are within normal limits. Kidneys and mid/proximal ureters are within normal limits. The bladder and  distal ureters are not well seen secondary to streak artifact in the pelvis, but appear grossly within normal limits.   Stomach/Bowel: Stomach is within normal limits. Appendix appears normal. No evidence of bowel wall thickening, distention, or inflammatory changes. There is sigmoid colon diverticulosis without evidence for acute diverticulitis.   Vascular/Lymphatic: Aortic atherosclerosis. No enlarged abdominal or pelvic lymph nodes.   Reproductive: Prostate is unremarkable.   Other: There is a large fat containing left inguinal hernia. There is no free fluid.   Musculoskeletal: No acute fractures are seen. Degenerative changes affect the spine. Sternotomy wires are present. There is a right hip arthroplasty.   IMPRESSION: 1. Large fat containing left inguinal hernia. 2. Sigmoid colon diverticulosis without evidence for acute diverticulitis. 3. There are 2 low-attenuation lesions in the pancreas which are incompletely characterize, but likely cystic. Recommend further evaluation with MRI. 4.  Aortic Atherosclerosis (ICD10-I70.0).     Electronically Signed   By: Ronney Asters M.D.   On: 12/05/2020 17:10  Assessment & Plan:  Sean Villarreal is a 80 y.o. male with left inguinal hernia that is symptomatic. He wants to proceed with repair. Dr. Domenic Polite cardiology, says the patient is doing well from a cardiac standpoint.   Discussed the risk and benefits including, bleeding, infection, use of mesh, risk of recurrence, risk of nerve damage causing numbness or changes in sensation, risk of damage to the cord structures. The patient understands the risk and benefits of repair with mesh, and has decided to proceed.  We also discussed open versus laparoscopic surgery and the use of mesh. We discussed that I do open repairs with mesh, and that this is considered equivalent to laparoscopic surgery. We discussed reasons for opting for laparoscopic surgery including if a bilateral repair  is needed or if a patient has a recurrence after an open repair.   All questions were answered to the satisfaction of the patient.    Virl Cagey 12/16/2020, 10:58 AM

## 2020-12-17 ENCOUNTER — Encounter (HOSPITAL_COMMUNITY)
Admission: RE | Admit: 2020-12-17 | Discharge: 2020-12-17 | Disposition: A | Discharge status: Home / Self Care | Payer: Medicare Other | Source: Ambulatory Visit | Attending: General Surgery | Admitting: General Surgery

## 2020-12-17 DIAGNOSIS — E1159 Type 2 diabetes mellitus with other circulatory complications: Secondary | ICD-10-CM

## 2020-12-24 ENCOUNTER — Ambulatory Visit (HOSPITAL_COMMUNITY)
Admission: RE | Admit: 2020-12-24 | Discharge: 2020-12-24 | Disposition: A | Discharge status: Home / Self Care | Payer: Medicare Other | Attending: General Surgery | Admitting: General Surgery

## 2020-12-24 ENCOUNTER — Encounter (HOSPITAL_COMMUNITY): Payer: Self-pay | Admitting: General Surgery

## 2020-12-24 ENCOUNTER — Ambulatory Visit (HOSPITAL_COMMUNITY): Payer: Medicare Other | Admitting: Anesthesiology

## 2020-12-24 ENCOUNTER — Other Ambulatory Visit: Payer: Self-pay

## 2020-12-24 ENCOUNTER — Encounter (HOSPITAL_COMMUNITY)
Admission: RE | Disposition: A | Discharge status: Home / Self Care | Payer: Self-pay | Source: Home / Self Care | Attending: General Surgery

## 2020-12-24 DIAGNOSIS — Z87891 Personal history of nicotine dependence: Secondary | ICD-10-CM | POA: Insufficient documentation

## 2020-12-24 DIAGNOSIS — K4090 Unilateral inguinal hernia, without obstruction or gangrene, not specified as recurrent: Secondary | ICD-10-CM | POA: Diagnosis not present

## 2020-12-24 DIAGNOSIS — E1159 Type 2 diabetes mellitus with other circulatory complications: Secondary | ICD-10-CM

## 2020-12-24 DIAGNOSIS — Z951 Presence of aortocoronary bypass graft: Secondary | ICD-10-CM | POA: Diagnosis not present

## 2020-12-24 DIAGNOSIS — I10 Essential (primary) hypertension: Secondary | ICD-10-CM | POA: Insufficient documentation

## 2020-12-24 DIAGNOSIS — I252 Old myocardial infarction: Secondary | ICD-10-CM | POA: Insufficient documentation

## 2020-12-24 DIAGNOSIS — Z955 Presence of coronary angioplasty implant and graft: Secondary | ICD-10-CM | POA: Diagnosis not present

## 2020-12-24 DIAGNOSIS — I2510 Atherosclerotic heart disease of native coronary artery without angina pectoris: Secondary | ICD-10-CM | POA: Diagnosis not present

## 2020-12-24 DIAGNOSIS — I25119 Atherosclerotic heart disease of native coronary artery with unspecified angina pectoris: Secondary | ICD-10-CM | POA: Diagnosis not present

## 2020-12-24 DIAGNOSIS — K219 Gastro-esophageal reflux disease without esophagitis: Secondary | ICD-10-CM | POA: Diagnosis not present

## 2020-12-24 HISTORY — PX: INGUINAL HERNIA REPAIR: SHX194

## 2020-12-24 LAB — GLUCOSE, CAPILLARY: Glucose-Capillary: 207 mg/dL — ABNORMAL HIGH (ref 70–99)

## 2020-12-24 SURGERY — REPAIR, HERNIA, INGUINAL, ADULT
Anesthesia: General | Site: Inguinal | Laterality: Left

## 2020-12-24 MED ORDER — OXYCODONE HCL 5 MG PO TABS
5.0000 mg | ORAL_TABLET | Freq: Once | ORAL | Status: AC
  Administered 2020-12-24: 5 mg via ORAL

## 2020-12-24 MED ORDER — OXYCODONE HCL 5 MG PO TABS
ORAL_TABLET | ORAL | Status: AC
  Filled 2020-12-24: qty 1

## 2020-12-24 MED ORDER — ACETAMINOPHEN 500 MG PO TABS
ORAL_TABLET | ORAL | Status: AC
  Filled 2020-12-24: qty 2

## 2020-12-24 MED ORDER — CHLORHEXIDINE GLUCONATE 0.12 % MT SOLN
OROMUCOSAL | Status: AC
  Filled 2020-12-24: qty 15

## 2020-12-24 MED ORDER — OXYCODONE HCL 5 MG PO TABS: 5.0000 mg | ORAL_TABLET | ORAL | 0 refills | Status: DC | PRN

## 2020-12-24 MED ORDER — FENTANYL CITRATE (PF) 100 MCG/2ML IJ SOLN
INTRAMUSCULAR | Status: DC | PRN
  Administered 2020-12-24: 100 ug via INTRAVENOUS
  Administered 2020-12-24 (×2): 50 ug via INTRAVENOUS

## 2020-12-24 MED ORDER — ORAL CARE MOUTH RINSE: 15.0000 mL | Freq: Once | OROMUCOSAL | Status: AC

## 2020-12-24 MED ORDER — ONDANSETRON HCL 4 MG PO TABS: 4.0000 mg | ORAL_TABLET | Freq: Three times a day (TID) | ORAL | 1 refills | Status: DC | PRN

## 2020-12-24 MED ORDER — FENTANYL CITRATE (PF) 100 MCG/2ML IJ SOLN
INTRAMUSCULAR | Status: AC
  Filled 2020-12-24: qty 2

## 2020-12-24 MED ORDER — CHLORHEXIDINE GLUCONATE CLOTH 2 % EX PADS: 6.0000 | MEDICATED_PAD | Freq: Once | CUTANEOUS | Status: DC

## 2020-12-24 MED ORDER — LACTATED RINGERS IV SOLN: INTRAVENOUS | Status: DC

## 2020-12-24 MED ORDER — PROPOFOL 10 MG/ML IV BOLUS
INTRAVENOUS | Status: DC | PRN
  Administered 2020-12-24: 30 mg via INTRAVENOUS
  Administered 2020-12-24: 40 mg via INTRAVENOUS
  Administered 2020-12-24: 100 mg via INTRAVENOUS
  Administered 2020-12-24: 30 mg via INTRAVENOUS

## 2020-12-24 MED ORDER — ACETAMINOPHEN 500 MG PO TABS
1000.0000 mg | ORAL_TABLET | Freq: Once | ORAL | Status: AC
  Administered 2020-12-24: 1000 mg via ORAL

## 2020-12-24 MED ORDER — BUPIVACAINE HCL (300 MG DOSE) 3 X 100 MG IL IMPL
DRUG_IMPLANT | Status: DC | PRN
  Administered 2020-12-24: 300 mg

## 2020-12-24 MED ORDER — SODIUM CHLORIDE 0.9 % IR SOLN
Status: DC | PRN
  Administered 2020-12-24: 1000 mL

## 2020-12-24 MED ORDER — CEFAZOLIN SODIUM-DEXTROSE 2-4 GM/100ML-% IV SOLN
2.0000 g | INTRAVENOUS | Status: AC
  Administered 2020-12-24: 2 g via INTRAVENOUS
  Filled 2020-12-24: qty 100

## 2020-12-24 MED ORDER — LIDOCAINE HCL (CARDIAC) PF 100 MG/5ML IV SOSY
PREFILLED_SYRINGE | INTRAVENOUS | Status: DC | PRN
  Administered 2020-12-24: 50 mg via INTRAVENOUS

## 2020-12-24 MED ORDER — ONDANSETRON HCL 4 MG/2ML IJ SOLN: 4.0000 mg | Freq: Once | INTRAMUSCULAR | Status: DC | PRN

## 2020-12-24 MED ORDER — CHLORHEXIDINE GLUCONATE 0.12 % MT SOLN
15.0000 mL | Freq: Once | OROMUCOSAL | Status: AC
  Administered 2020-12-24: 15 mL via OROMUCOSAL

## 2020-12-24 MED ORDER — PROPOFOL 10 MG/ML IV BOLUS
INTRAVENOUS | Status: AC
  Filled 2020-12-24: qty 20

## 2020-12-24 MED ORDER — BUPIVACAINE HCL (300 MG DOSE) 3 X 100 MG IL IMPL
DRUG_IMPLANT | Status: AC
  Filled 2020-12-24: qty 100

## 2020-12-24 MED ORDER — ONDANSETRON HCL 4 MG/2ML IJ SOLN
INTRAMUSCULAR | Status: AC
  Filled 2020-12-24: qty 2

## 2020-12-24 MED ORDER — FENTANYL CITRATE PF 50 MCG/ML IJ SOSY
25.0000 ug | PREFILLED_SYRINGE | INTRAMUSCULAR | Status: DC | PRN
  Administered 2020-12-24: 50 ug via INTRAVENOUS
  Filled 2020-12-24: qty 1

## 2020-12-24 SURGICAL SUPPLY — 32 items
ADH SKN CLS APL DERMABOND .7 (GAUZE/BANDAGES/DRESSINGS) ×1
CLOTH BEACON ORANGE TIMEOUT ST (SAFETY) ×2 IMPLANT
COVER LIGHT HANDLE STERIS (MISCELLANEOUS) ×4 IMPLANT
DERMABOND ADVANCED (GAUZE/BANDAGES/DRESSINGS) ×1
DERMABOND ADVANCED .7 DNX12 (GAUZE/BANDAGES/DRESSINGS) ×1 IMPLANT
DRAIN PENROSE 0.5X18 (DRAIN) ×2 IMPLANT
ELECT REM PT RETURN 9FT ADLT (ELECTROSURGICAL) ×2
ELECTRODE REM PT RTRN 9FT ADLT (ELECTROSURGICAL) ×1 IMPLANT
GAUZE 4X4 16PLY ~~LOC~~+RFID DBL (SPONGE) ×2 IMPLANT
GAUZE SPONGE 4X4 12PLY STRL (GAUZE/BANDAGES/DRESSINGS) ×2 IMPLANT
GLOVE SURG ENC MOIS LTX SZ6.5 (GLOVE) ×2 IMPLANT
GLOVE SURG UNDER POLY LF SZ7 (GLOVE) ×8 IMPLANT
GOWN STRL REUS W/TWL LRG LVL3 (GOWN DISPOSABLE) ×6 IMPLANT
INST SET MINOR GENERAL (KITS) ×2 IMPLANT
KIT TURNOVER KIT A (KITS) ×2 IMPLANT
LIGASURE IMPACT 36 18CM CVD LR (INSTRUMENTS) ×2 IMPLANT
MANIFOLD NEPTUNE II (INSTRUMENTS) ×2 IMPLANT
MESH MARLEX PLUG MEDIUM (Mesh General) ×2 IMPLANT
NEEDLE HYPO 18GX1.5 BLUNT FILL (NEEDLE) ×2 IMPLANT
NEEDLE HYPO 21X1.5 SAFETY (NEEDLE) ×2 IMPLANT
NS IRRIG 1000ML POUR BTL (IV SOLUTION) ×2 IMPLANT
PACK MINOR (CUSTOM PROCEDURE TRAY) ×2 IMPLANT
PAD ARMBOARD 7.5X6 YLW CONV (MISCELLANEOUS) ×2 IMPLANT
SET BASIN LINEN APH (SET/KITS/TRAYS/PACK) ×2 IMPLANT
SOL PREP PROV IODINE SCRUB 4OZ (MISCELLANEOUS) ×2 IMPLANT
SUT MNCRL AB 4-0 PS2 18 (SUTURE) ×2 IMPLANT
SUT NOVA NAB GS-22 2 2-0 T-19 (SUTURE) ×8 IMPLANT
SUT VIC AB 2-0 CT1 27 (SUTURE) ×8
SUT VIC AB 2-0 CT1 TAPERPNT 27 (SUTURE) ×4 IMPLANT
SUT VIC AB 3-0 SH 27 (SUTURE) ×2
SUT VIC AB 3-0 SH 27X BRD (SUTURE) ×1 IMPLANT
SUT VICRYL AB 3 0 TIES (SUTURE) ×2 IMPLANT

## 2020-12-24 NOTE — Interval H&P Note (Signed)
History and Physical Interval Note:  12/24/2020 10:36 AM  Sean Villarreal  has presented today for surgery, with the diagnosis of Left inguinal hernia.  The various methods of treatment have been discussed with the patient and family. After consideration of risks, benefits and other options for treatment, the patient has consented to  Procedure(s): HERNIA REPAIR INGUINAL ADULT (Left) as a surgical intervention.  The patient's history has been reviewed, patient examined, no change in status, stable for surgery.  I have reviewed the patient's chart and labs.  Questions were answered to the patient's satisfaction.     Virl Cagey

## 2020-12-24 NOTE — Anesthesia Postprocedure Evaluation (Signed)
Anesthesia Post Note  Patient: Sean Villarreal  Procedure(s) Performed: HERNIA REPAIR INGUINAL WITH MESH (Left: Inguinal)  Patient location during evaluation: Phase II Anesthesia Type: General Level of consciousness: awake Pain management: pain level controlled Vital Signs Assessment: post-procedure vital signs reviewed and stable Respiratory status: spontaneous breathing and respiratory function stable Cardiovascular status: blood pressure returned to baseline and stable Postop Assessment: no headache and no apparent nausea or vomiting Anesthetic complications: no Comments: Late entry   No notable events documented.   Last Vitals:  Vitals:   12/24/20 1300 12/24/20 1319  BP: 114/64 131/75  Pulse: (!) 58 62  Resp: 19 17  Temp: 36.5 C (!) 36.4 C  SpO2: 92% 94%    Last Pain:  Vitals:   12/24/20 1319  TempSrc: Oral  PainSc: [place]

## 2020-12-24 NOTE — Discharge Instructions (Signed)
Discharge Instructions Hernia:  Common Complaints: Pain at the incision site is common. This will improve with time. Take your pain medications as described below. Swelling and bruising is common. Use a ice pack over the groin area, prop the testicles up with a towel under them to help with swelling.  Some nausea is common and poor appetite. The main goal is to stay hydrated the first few days after surgery.  Numbness at the incision or the thigh is common.  If you start to have burning or tingling pain in your groin, this is from a nerve being pinched. Please call and we can prescribe you a different type of pain medication for nerve pain.   Diet/ Activity: Diet as tolerated. You may not have an appetite, but it is important to stay hydrated. Drink 64 ounces of water a day. Your appetite will return with time.  Shower per your regular routine daily.  Do not take hot showers. Take warm showers that are less than 10 minutes. Rest and listen to your body, but do not remain in bed all day.  Walk everyday for at least 15-20 minutes. Deep cough and move around every 1-2 hours in the first few days after surgery.  Do not pick at the dermabond glue on your incision sites.  This glue film will remain in place for 1-2 weeks and will start to peel off.  Do not place lotions or balms on your incision unless instructed to specifically by Dr. Constance Haw.  Do not lift > 10 lbs, perform excessive bending, pushing, pulling, squatting for 6-8 weeks after surgery.   Pain Expectations and Narcotics: -After surgery you will have pain associated with your incisions and this is normal. The pain is muscular and nerve pain, and will get better with time. -You are encouraged and expected to take non narcotic medications like tylenol and ibuprofen (when able) to treat pain as multiple modalities can aid with pain treatment. -Narcotics are only used when pain is severe or there is breakthrough pain. -You are not expected to  have a pain score of 0 after surgery, as we cannot prevent pain. A pain score of 3-4 that allows you to be functional, move, walk, and tolerate some activity is the goal. The pain will continue to improve over the days after surgery and is dependent on your surgery. -Due to [place] law, we are only able to give a certain amount of pain medication to treat post operative pain, and we only give additional narcotics on a patient by patient basis.  -For most laparoscopic surgery, studies have shown that the majority of patients only need 10-15 narcotic pills, and for open surgeries most patients only need 15-20.   -Having appropriate expectations of pain and knowledge of pain management with non narcotics is important as we do not want anyone to become addicted to narcotic pain medication.  -Using ice packs in the first 48 hours and heating pads after 48 hours, wearing an abdominal binder (when recommended), and using over the counter medications are all ways to help with pain management.   -Simple acts like meditation and mindfulness practices after surgery can also help with pain control and research has proven the benefit of these practices.  Medication: Take tylenol and ibuprofen as needed for pain control, alternating every 4-6 hours.  Example:  Tylenol 1000mg  @ 6am, 12noon, 6pm, 81midnight (Do not exceed 4000mg  of tylenol a day). Ibuprofen 800mg  @ 9am, 3pm, 9pm, 3am (Do not exceed 3600mg  of ibuprofen  a day).  Take Roxicodone for breakthrough pain every 4 hours.  Take Colace for constipation related to narcotic pain medication. If you do not have a bowel movement in 2 days, take Miralax over the counter.  Drink plenty of water to also prevent constipation.   Contact Information: If you have questions or concerns, please call our office, [phone], Monday- Thursday 8AM-5PM and Friday 8AM-12Noon.  If it is after hours or on the weekend, please call [facility], [phone], [phone], and  ask to speak to the surgeon on call for Dr. Constance Haw at [facility].

## 2020-12-24 NOTE — Op Note (Signed)
[facility] Operative Note  12/24/20  Preoperative Diagnosis: Left inguinal hernia    Postoperative Diagnosis: Same   Procedure(s) Performed: Left inguinal hernia repair with mesh   Surgeon: [name], MD   Assistants: No qualified resident was available   Anesthesia: General endotracheal   Anesthesiologist: Dr. Briant Cedar, MD    Specimens: none    Estimated Blood Loss: Minimal   Blood Replacement: None    Complications: None   Wound Class: Clean    Operative Indications: Sean Villarreal is a very sweet 80 yo with a large fat containing hernia. He had been to the ED and had imaging.   Findings: Large amount of fat in hernia with thin sac, suture ligated to close thin sac   Procedure: The patient was taken to the operating room and placed supine. General endotracheal anesthesia was induced. Intravenous antibiotics were  administered per protocol.  A time out was preformed verifying the correct patient, procedure, site, positioning and implants.  The left groin and scrotum were prepared and draped in the usual sterile fashion.   An incision was made in a natural skin crease between the pubic tubercle and the anterior superior iliac spine.  The incision was deepened with electrocautery through Scarpa's and Camper's fascia until the aponeurosis of the external oblique was encountered.  This was cleaned and the external ring was exposed.  An incision was made in the midportion of the external oblique aponeurosis in the direction of its fibers. The ilioinguinal nerve was identified and was protected throughout the dissection.  Flaps of the external oblique were developed cephalad and inferiorly.    The cord was identified and it was gently dissented free at the pubic tubercle and encircled with a Penrose drain.  Attention was then directed at the anteromedial aspect of the cord, where an indirect hernia sac was identified.  The sac was carefully dissected free from  the cord down to the level of the internal ring.  The vas and testicular vessels were identified and protected from harm.  Once the sac was dissected free from the cords, the Penrose was placed around the cord which was retracted inferiorly out of the field of view.  The hernia was very large with significant fatty tissue and a thin sac. I could not reduce it all into the ring. I could place my finger into the inguinal canal between the fat and no other tissue or hernia sac was noted. I ensured it was only fat and proceeded to take the fat with the ligasure and suture ligated the area thought to be the sac with a 2-0 Vicryl. This was reduced back into the internal ring. Given that the sac was so thin, I did not use a Plug as I was afraid of it eroding into the peritoneal cavity. Attention was then turned to the floor of the canal, which was grossly weakened without any defined defect or sac.  The Perfix Mesh Patch was sutured to the inguinal ligament inferiorly starting at the pubic tubercle using 2-0 Novafil interrupted sutures.  The mesh was sutured superiorly to the conjoint tendon using 2-0 Novafil interrupted sutures.  Care was taken to ensure the mesh was placed in a relaxed fashion to avoid excessive tension and no neurovascular structures were caught in the repair.  I had to cut the opening of the tails longer to account fo the location of the cord. The tails were then closed and crossed to recreate the internal ring, allowing for passage  of cords without tension.   Hemostasis was adequate.  The Penrose was removed.  Xaracoll was placed over and under the cord. The external oblique aponeurosis was closed with a 2-0 Vicryl suture in a running fashion, taking care to not catch the ilioinguinal nerve in the suture line.  Anotehr patch of Xaracoll was placed and Scarpa's fashion was closed with 3-0 Vicryl interrupted sutures. Another patch Xaracoll was placed. The skin was closed with a subcuticular 4-0  Monocryl suture.  Dermabond was applied.   The testis was gently pulled down into its anatomic position in the scrotum.  The patient tolerated the procedure well and was taken to the PACU in stable condition. All counts were correct at the end of the case.        Curlene Labrum, MD [facility] [address] [place], [place] [address] [phone] (office)

## 2020-12-24 NOTE — Anesthesia Preprocedure Evaluation (Addendum)
Anesthesia Evaluation  Patient identified by MRN, date of birth, ID band Patient awake    Reviewed: Allergy & Precautions, H&P , NPO status , Patient's Chart, lab work & pertinent test results, reviewed documented beta blocker date and time   Airway Mallampati: II  TM Distance: >3 FB Neck ROM: full    Dental no notable dental hx.    Pulmonary shortness of breath, former smoker,    Pulmonary exam normal breath sounds clear to auscultation       Cardiovascular Exercise Tolerance: Good hypertension, + angina + CAD, + Past MI and + CABG   Rhythm:regular Rate:Normal     Neuro/Psych negative neurological ROS  negative psych ROS   GI/Hepatic Neg liver ROS, GERD  Medicated,  Endo/Other  negative endocrine ROSdiabetes  Renal/GU negative Renal ROS  negative genitourinary   Musculoskeletal   Abdominal   Peds  Hematology negative hematology ROS (+)   Anesthesia Other Findings   Reproductive/Obstetrics negative OB ROS                             Anesthesia Physical Anesthesia Plan  ASA: 3  Anesthesia Plan: General   Post-op Pain Management:    Induction:   PONV Risk Score and Plan: Ondansetron  Airway Management Planned:   Additional Equipment:   Intra-op Plan:   Post-operative Plan:   Informed Consent: I have reviewed the patients History and Physical, chart, labs and discussed the procedure including the risks, benefits and alternatives for the proposed anesthesia with the patient or authorized representative who has indicated his/her understanding and acceptance.     Dental Advisory Given  Plan Discussed with: CRNA  Anesthesia Plan Comments:        Anesthesia Quick Evaluation

## 2020-12-24 NOTE — Transfer of Care (Signed)
Immediate Anesthesia Transfer of Care Note  Patient: Sean Villarreal  Procedure(s) Performed: HERNIA REPAIR INGUINAL ADULT (Left: Inguinal)  Patient Location: PACU  Anesthesia Type:General  Level of Consciousness: awake, alert , oriented and patient cooperative  Airway & Oxygen Therapy: Patient Spontanous Breathing and Patient connected to nasal cannula oxygen  Post-op Assessment: Report given to RN, Post -op Vital signs reviewed and stable and Patient moving all extremities X 4  Post vital signs: Reviewed and stable  Last Vitals:  Vitals Value Taken Time  BP 123/56 12/24/20 1232  Temp 36.5 C 12/24/20 1232  Pulse 71 12/24/20 1236  Resp 18 12/24/20 1236  SpO2 93 % 12/24/20 1236  Vitals shown include unvalidated device data.  Last Pain:  Vitals:   12/24/20 1005  TempSrc: Oral  PainSc: 0-No pain         Complications: No notable events documented.

## 2020-12-24 NOTE — Anesthesia Procedure Notes (Signed)
Procedure Name: LMA Insertion Date/Time: 12/24/2020 10:49 AM Performed by: Jonna Munro, CRNA Pre-anesthesia Checklist: Patient identified, Emergency Drugs available, Suction available, Patient being monitored and Timeout performed Patient Re-evaluated:Patient Re-evaluated prior to induction Oxygen Delivery Method: Circle system utilized Preoxygenation: Pre-oxygenation with 100% oxygen Induction Type: IV induction LMA: LMA inserted LMA Size: 4.0 Number of attempts: 1 Placement Confirmation: positive ETCO2 and breath sounds checked- equal and bilateral Tube secured with: Tape Dental Injury: Teeth and Oropharynx as per pre-operative assessment

## 2020-12-24 NOTE — Progress Notes (Signed)
[facility]  Updated daughter. Rx sent to [facility] in [place]. Will see in 4 weeks. Needs to urinate before leaving.   Curlene Labrum, MD [facility] [address] [place], [place] [address] [phone] (office)

## 2020-12-25 ENCOUNTER — Encounter (HOSPITAL_COMMUNITY): Payer: Self-pay | Admitting: General Surgery

## 2020-12-25 LAB — HEMOGLOBIN A1C
Hgb A1c MFr Bld: 7.7 % — ABNORMAL HIGH (ref 4.8–5.6)
Mean Plasma Glucose: 174 mg/dL

## 2021-01-02 DIAGNOSIS — B3742 Candidal balanitis: Secondary | ICD-10-CM | POA: Diagnosis not present

## 2021-01-02 DIAGNOSIS — Z6830 Body mass index (BMI) 30.0-30.9, adult: Secondary | ICD-10-CM | POA: Diagnosis not present

## 2021-01-05 ENCOUNTER — Encounter (HOSPITAL_COMMUNITY): Payer: Self-pay | Admitting: Radiology

## 2021-01-13 DIAGNOSIS — E7801 Familial hypercholesterolemia: Secondary | ICD-10-CM | POA: Diagnosis not present

## 2021-01-13 DIAGNOSIS — I10 Essential (primary) hypertension: Secondary | ICD-10-CM | POA: Diagnosis not present

## 2021-01-13 DIAGNOSIS — E1165 Type 2 diabetes mellitus with hyperglycemia: Secondary | ICD-10-CM | POA: Diagnosis not present

## 2021-01-13 DIAGNOSIS — Z1329 Encounter for screening for other suspected endocrine disorder: Secondary | ICD-10-CM | POA: Diagnosis not present

## 2021-01-13 DIAGNOSIS — E7800 Pure hypercholesterolemia, unspecified: Secondary | ICD-10-CM | POA: Diagnosis not present

## 2021-01-16 DIAGNOSIS — Z6831 Body mass index (BMI) 31.0-31.9, adult: Secondary | ICD-10-CM | POA: Diagnosis not present

## 2021-01-16 DIAGNOSIS — I10 Essential (primary) hypertension: Secondary | ICD-10-CM | POA: Diagnosis not present

## 2021-01-16 DIAGNOSIS — Z0001 Encounter for general adult medical examination with abnormal findings: Secondary | ICD-10-CM | POA: Diagnosis not present

## 2021-01-16 DIAGNOSIS — E1165 Type 2 diabetes mellitus with hyperglycemia: Secondary | ICD-10-CM | POA: Diagnosis not present

## 2021-01-22 ENCOUNTER — Encounter: Payer: Self-pay | Admitting: General Surgery

## 2021-01-22 ENCOUNTER — Other Ambulatory Visit: Payer: Self-pay

## 2021-01-22 ENCOUNTER — Ambulatory Visit (INDEPENDENT_AMBULATORY_CARE_PROVIDER_SITE_OTHER): Payer: Medicare Other | Admitting: General Surgery

## 2021-01-22 VITALS — BP 130/77 | HR 79 | Temp 98.7°F | Resp 16 | Ht 69.0 in | Wt 201.0 lb

## 2021-01-22 DIAGNOSIS — K4090 Unilateral inguinal hernia, without obstruction or gangrene, not specified as recurrent: Secondary | ICD-10-CM

## 2021-01-22 NOTE — Progress Notes (Signed)
[facility]  Healing after left inguinal hernia repair with mesh. Glue is still peeling. Was sore but doing much better now.  BP 130/77    Pulse 79    Temp 98.7 F (37.1 C) (Oral)    Resp 16    Ht 5\' 9"  (1.753 m)    Wt 201 lb (91.2 kg)    SpO2 95%    BMI 29.68 kg/m  Left inguinal incision without erythema or drainage, lateral aspect with some irritation of the incision itself but not extending out  Patient s/p Left inguinal hernia repair with mesh. Doing well. PRN follow up No heavy lifting > 10 lbs, excessive bending, pushing, pulling, or squatting for 6-8 weeks after surgery.   Curlene Labrum, MD [facility] [address] [place], [place] [address] [phone] (office)

## 2021-01-22 NOTE — Patient Instructions (Signed)
No heavy lifting > 10 lbs, excessive bending, pushing, pulling, or squatting for 6-8 weeks after surgery.  Call with issues

## 2021-03-03 DIAGNOSIS — L820 Inflamed seborrheic keratosis: Secondary | ICD-10-CM | POA: Diagnosis not present

## 2021-03-03 DIAGNOSIS — L219 Seborrheic dermatitis, unspecified: Secondary | ICD-10-CM | POA: Diagnosis not present

## 2021-03-03 DIAGNOSIS — L821 Other seborrheic keratosis: Secondary | ICD-10-CM | POA: Diagnosis not present

## 2021-03-03 DIAGNOSIS — Z85828 Personal history of other malignant neoplasm of skin: Secondary | ICD-10-CM | POA: Diagnosis not present

## 2021-03-03 DIAGNOSIS — L570 Actinic keratosis: Secondary | ICD-10-CM | POA: Diagnosis not present

## 2021-03-03 DIAGNOSIS — L905 Scar conditions and fibrosis of skin: Secondary | ICD-10-CM | POA: Diagnosis not present

## 2021-04-07 NOTE — Progress Notes (Signed)
? ? ?Cardiology Office Note ? ?Date: 04/08/2021  ? ?ID: Sean Villarreal, DOB [birth year], MRN [id number] ? ?PCP:  Denny Levy, PA  ?Cardiologist:  Rozann Lesches, MD ?Electrophysiologist:  None  ? ?Chief Complaint  ?Patient presents with  ? Cardiac follow-up  ? ? ?History of Present Illness: ?Sean Villarreal is a 81 y.o. male last seen in September 2022.  He is here for a follow-up visit.  Reports no angina symptoms or nitroglycerin use.  Stays busy with ADLs and chores, NYHA class II dyspnea, no palpitations or syncope.  He has had some trouble with intermittent lower leg and ankle swelling, using his Lasix more frequently. ? ?He did undergo left inguinal hernia repair with mesh by Dr. Constance Haw in November 2022.  No obvious perioperative cardiac complications. ? ?I reviewed his most recent lab work which is outlined below.  LDL was well controlled at 54 and renal function is stable.  We went over his medications, discussed increasing Lasix and potassium supplement to daily use.  I personally reviewed his ECG today which shows sinus rhythm with right bundle branch block. ? ?Echocardiogram was in December 2021 at which point he had evidence of mild aortic stenosis. ? ?Past Medical History:  ?Diagnosis Date  ? Arthritis   ? Basal cell carcinoma   ? Coronary artery disease   ? a. DES to LAD and LCx April 2014 b. PTCA ostial circumflex 03/2016 due to ISR c.  9/18 PCI/DESx2 osital Lcx (ISR), p/mLCx  d. s/p CABG on 01/24/2020 with LIMA-LAD and SVG-OM.  ? Dyslipidemia   ? Essential hypertension   ? GERD (gastroesophageal reflux disease)   ? Hematuria   ? History of blood transfusion 09/2012  ? History of kidney stones   ? NSTEMI (non-ST elevated myocardial infarction) ([place]) 08/2012  ? Type II diabetes mellitus ([place])   ? ? ?Past Surgical History:  ?Procedure Laterality Date  ? BASAL CELL CARCINOMA EXCISION    ? "right cheek; both shoulders"  ? CARDIAC CATHETERIZATION    ? CATARACT EXTRACTION W/ INTRAOCULAR LENS  IMPLANT,  BILATERAL Bilateral   ? COLONOSCOPY N/A 03/19/2016  ? Procedure: COLONOSCOPY;  Surgeon: Rogene Houston, MD;  [facility];  Service: Endoscopy;  Laterality: N/A;  9:15  ? CORONARY ARTERY BYPASS GRAFT N/A 01/24/2020  ? Procedure: CORONARY ARTERY BYPASS GRAFTING (CABG), ON PUMP, TIMES TWO, USING LEFT INTERNAL MAMMARY ARTERY AND ENDOSCOPICALLY HARVESTED RIGHT GREATER SAPHENOUS VEIN;  Surgeon: Melrose Nakayama, MD;  Location: [facility];  Service: Open Heart Surgery;  Laterality: N/A;  ? CORONARY BALLOON ANGIOPLASTY N/A 04/15/2016  ? Procedure: Coronary Balloon Angioplasty;  Surgeon: Peter M Martinique, MD;  Location: [place] CV LAB;  Service: Cardiovascular;  Laterality: N/A;  ? CORONARY STENT INTERVENTION N/A 10/11/2016  ? Procedure: CORONARY STENT INTERVENTION;  Surgeon: Jettie Booze, MD;  Location: [place] CV LAB;  Service: Cardiovascular;  Laterality: N/A;  ? CYSTOSCOPY W/ URETEROSCOPY W/ LITHOTRIPSY  10/2012  ? /notes 11/10/2012  ? CYSTOSCOPY WITH STENT PLACEMENT  08/2012; 09/2012  ? EYE SURGERY Left ~ 02/2016  ? "for crinkled lens"  ? INGUINAL HERNIA REPAIR Left 12/24/2020  ? Procedure: HERNIA REPAIR INGUINAL WITH MESH;  Surgeon: Virl Cagey, MD;  Location: [facility];  Service: General;  Laterality: Left;  ? LEFT HEART CATH AND CORONARY ANGIOGRAPHY N/A 04/15/2016  ? Procedure: Left Heart Cath and Coronary Angiography;  Surgeon: Peter M Martinique, MD;  Location: [place] [facility];  Service: Cardiovascular;  Laterality: N/A;  ? LEFT HEART CATH AND CORONARY ANGIOGRAPHY N/A 10/11/2016  ? Procedure: LEFT HEART CATH AND CORONARY ANGIOGRAPHY;  Surgeon: Jettie Booze, MD;  Location: [place] [facility] LAB;  Service: Cardiovascular;  Laterality: N/A;  ? LEFT HEART CATH AND CORONARY ANGIOGRAPHY N/A 03/31/2018  ? Procedure: LEFT HEART CATH AND CORONARY ANGIOGRAPHY;  Surgeon: Troy Sine, MD;  Location: [place] CV LAB;  Service: Cardiovascular;  Laterality: N/A;  ? LEFT HEART CATH AND CORONARY  ANGIOGRAPHY N/A 01/21/2020  ? Procedure: LEFT HEART CATH AND CORONARY ANGIOGRAPHY;  Surgeon: Belva Crome, MD;  Location: [place] [facility];  Service: Cardiovascular;  Laterality: N/A;  ? LEFT HEART CATHETERIZATION WITH CORONARY ANGIOGRAM N/A 05/05/2012  ? Procedure: LEFT HEART CATHETERIZATION WITH CORONARY ANGIOGRAM;  Surgeon: Burnell Blanks, MD;  Location: [facility] CATH LAB;  Service: Cardiovascular;  Laterality: N/A;  ? LEFT HEART CATHETERIZATION WITH CORONARY ANGIOGRAM N/A 05/15/2012  ? Procedure: LEFT HEART CATHETERIZATION WITH CORONARY ANGIOGRAM;  Surgeon: Burnell Blanks, MD;  Location: [facility] CATH LAB;  Service: Cardiovascular;  Laterality: N/A;  ? TEE WITHOUT CARDIOVERSION N/A 01/24/2020  ? Procedure: TRANSESOPHAGEAL ECHOCARDIOGRAM (TEE);  Surgeon: Melrose Nakayama, MD;  Location: [facility];  Service: Open Heart Surgery;  Laterality: N/A;  ? TONSILLECTOMY  1947  ? ? ?Current Outpatient Medications  ?Medication Sig Dispense Refill  ? acetaminophen (TYLENOL) 500 MG tablet Take 500-1,000 mg by mouth every 6 (six) hours as needed (headaches).    ? amLODipine (NORVASC) 10 MG tablet Take 1 tablet once daily or as directed. 90 tablet 3  ? aspirin EC 81 MG tablet Take 81 mg by mouth in the morning. Swallow whole.    ? atorvastatin (LIPITOR) 80 MG tablet Take 80 mg by mouth at bedtime.    ? carvedilol (COREG) 25 MG tablet Take 1 tablet (25 mg total) by mouth 2 (two) times daily with a meal. 60 tablet 11  ? glimepiride (AMARYL) 1 MG tablet Take 1 mg by mouth daily with breakfast.    ? lisinopril (ZESTRIL) 40 MG tablet Take 40 mg by mouth daily.    ? metFORMIN (GLUCOPHAGE) 1000 MG tablet Take 1,000 mg by mouth 2 (two) times daily with a meal.    ? nitroGLYCERIN (NITROSTAT) 0.4 MG SL tablet Place 0.4 mg under the tongue every 5 (five) minutes x 3 doses as needed for chest pain.    ? Polyethyl Glycol-Propyl Glycol (LUBRICANT EYE DROPS) 0.4-0.3 % SOLN Place 1-2 drops into both eyes 3 (three) times daily as needed  (dry/irritated eyes).    ? psyllium (METAMUCIL SMOOTH TEXTURE) 28 % packet Take 1 packet by mouth at bedtime.    ? tamsulosin (FLOMAX) 0.4 MG CAPS capsule Take 0.4 mg by mouth 2 (two) times daily.    ? furosemide (LASIX) 20 MG tablet Take 1 tablet (20 mg total) by mouth daily. 90 tablet 3  ? potassium chloride SA (KLOR-CON M20) 20 MEQ tablet Take 2 tablets (40 mEq total) by mouth daily. 180 tablet 3  ? ?No current facility-administered medications for this visit.  ? ?Allergies:  Contrast media [iodinated contrast media], Penicillins, and Jardiance [empagliflozin]  ? ?ROS: No orthopnea or PND. ? ?Physical Exam: ?VS:  BP (!) 142/78   Pulse 89   Ht '5\' 9"'$  (1.753 m)   Wt 209 lb 6.4 oz (95 kg)   SpO2 98%   BMI 30.92 kg/m? , BMI Body mass index is 30.92 kg/m?. ? ?Wt Readings from Last 3 Encounters:  ?  04/08/21 209 lb 6.4 oz (95 kg)  ?01/22/21 201 lb (91.2 kg)  ?12/16/20 205 lb (93 kg)  ?  ?General: Patient appears comfortable at rest. ?HEENT: Conjunctiva and lids normal, wearing a mask. ?Neck: Supple, no elevated JVP or carotid bruits, no thyromegaly. ?Lungs: Clear to auscultation, nonlabored breathing at rest. ?Cardiac: Regular rate and rhythm, no S3, 3/6 systolic murmur, no pericardial rub. ?Extremities: Mild ankle edema. ? ?ECG:  An ECG dated 03/24/2020 was personally reviewed today and demonstrated:  Sinus rhythm with right bundle branch block. ? ?Recent Labwork: ?12/05/2020: ALT 31; AST 23; BUN 11; Creatinine, Ser 0.77; Hemoglobin 15.5; Platelets 179; Potassium 3.5; Sodium 139  ?   ?Component Value Date/Time  ? CHOL 132 01/22/2020 0637  ? TRIG 67 01/22/2020 0637  ? HDL 52 01/22/2020 0637  ? CHOLHDL 2.5 01/22/2020 8325  ? VLDL 13 01/22/2020 0637  ? [place] 67 01/22/2020 0637  ?December 2022: Hgb 14.4, platelets 227, BUN 17, creatinine 0.72, potassium 3.6, AST 18, ALT 21, cholesterol 118, TG 56, HDL 49, LDL 54, TSH 2.05, HgbA1c 7.9% ? ?Other Studies Reviewed Today: ? ?Echocardiogram 01/21/2020: ? 1. Left ventricular  ejection fraction, by estimation, is 55 to 60%. The  ?left ventricle has normal function. The left ventricle has no regional  ?wall motion abnormalities. There is mild concentric left ventricular  ?hypertroph

## 2021-04-08 ENCOUNTER — Encounter: Payer: Self-pay | Admitting: Cardiology

## 2021-04-08 ENCOUNTER — Ambulatory Visit (INDEPENDENT_AMBULATORY_CARE_PROVIDER_SITE_OTHER): Payer: Medicare Other | Admitting: Cardiology

## 2021-04-08 VITALS — BP 142/78 | HR 89 | Ht 69.0 in | Wt 209.4 lb

## 2021-04-08 DIAGNOSIS — I350 Nonrheumatic aortic (valve) stenosis: Secondary | ICD-10-CM | POA: Diagnosis not present

## 2021-04-08 DIAGNOSIS — E782 Mixed hyperlipidemia: Secondary | ICD-10-CM

## 2021-04-08 DIAGNOSIS — I25119 Atherosclerotic heart disease of native coronary artery with unspecified angina pectoris: Secondary | ICD-10-CM

## 2021-04-08 DIAGNOSIS — R600 Localized edema: Secondary | ICD-10-CM | POA: Diagnosis not present

## 2021-04-08 MED ORDER — POTASSIUM CHLORIDE CRYS ER 20 MEQ PO TBCR: 40.0000 meq | EXTENDED_RELEASE_TABLET | Freq: Every day | ORAL | 3 refills | Status: DC

## 2021-04-08 MED ORDER — FUROSEMIDE 20 MG PO TABS: 20.0000 mg | ORAL_TABLET | Freq: Every day | ORAL | 3 refills | Status: DC

## 2021-04-08 NOTE — Patient Instructions (Signed)
Medication Instructions:  ?Your physician has recommended you make the following change in your medication:  ?Start taking lasix once a day ?Start taking potassium once a day ?Continue all other medications as directed. ? ?Labwork: ?none ? ?Testing/Procedures: ?Your physician has requested that you have an echocardiogram. Echocardiography is a painless test that uses sound waves to create images of your heart. It provides your doctor with information about the size and shape of your heart and how well your heart?s chambers and valves are working. This procedure takes approximately one hour. There are no restrictions for this procedure.  ? ?Follow-Up: ?Your physician recommends that you schedule a follow-up appointment in: 6 months ? ?Any Other Special Instructions Will Be Listed Below (If Applicable). ? ?If you need a refill on your cardiac medications before your next appointment, please call your pharmacy. ? ?

## 2021-07-21 DIAGNOSIS — Z6830 Body mass index (BMI) 30.0-30.9, adult: Secondary | ICD-10-CM | POA: Diagnosis not present

## 2021-07-21 DIAGNOSIS — I2510 Atherosclerotic heart disease of native coronary artery without angina pectoris: Secondary | ICD-10-CM | POA: Diagnosis not present

## 2021-07-21 DIAGNOSIS — I10 Essential (primary) hypertension: Secondary | ICD-10-CM | POA: Diagnosis not present

## 2021-07-21 DIAGNOSIS — E1165 Type 2 diabetes mellitus with hyperglycemia: Secondary | ICD-10-CM | POA: Diagnosis not present

## 2021-07-21 DIAGNOSIS — R300 Dysuria: Secondary | ICD-10-CM | POA: Diagnosis not present

## 2021-07-31 DIAGNOSIS — E1165 Type 2 diabetes mellitus with hyperglycemia: Secondary | ICD-10-CM | POA: Diagnosis not present

## 2021-08-04 DIAGNOSIS — Z9849 Cataract extraction status, unspecified eye: Secondary | ICD-10-CM | POA: Diagnosis not present

## 2021-08-04 DIAGNOSIS — E119 Type 2 diabetes mellitus without complications: Secondary | ICD-10-CM | POA: Diagnosis not present

## 2021-08-04 DIAGNOSIS — H43813 Vitreous degeneration, bilateral: Secondary | ICD-10-CM | POA: Diagnosis not present

## 2021-08-04 DIAGNOSIS — Z961 Presence of intraocular lens: Secondary | ICD-10-CM | POA: Diagnosis not present

## 2021-08-04 DIAGNOSIS — H524 Presbyopia: Secondary | ICD-10-CM | POA: Diagnosis not present

## 2021-08-04 DIAGNOSIS — H5203 Hypermetropia, bilateral: Secondary | ICD-10-CM | POA: Diagnosis not present

## 2021-08-04 DIAGNOSIS — H1045 Other chronic allergic conjunctivitis: Secondary | ICD-10-CM | POA: Diagnosis not present

## 2021-08-04 DIAGNOSIS — H52223 Regular astigmatism, bilateral: Secondary | ICD-10-CM | POA: Diagnosis not present

## 2021-08-04 DIAGNOSIS — H401121 Primary open-angle glaucoma, left eye, mild stage: Secondary | ICD-10-CM | POA: Diagnosis not present

## 2021-08-26 ENCOUNTER — Ambulatory Visit (INDEPENDENT_AMBULATORY_CARE_PROVIDER_SITE_OTHER): Payer: Medicare Other

## 2021-08-26 DIAGNOSIS — I350 Nonrheumatic aortic (valve) stenosis: Secondary | ICD-10-CM

## 2021-08-27 LAB — ECHOCARDIOGRAM COMPLETE
AR max vel: 1.1 cm2
AV Area VTI: 1.17 cm2
AV Area mean vel: 1.02 cm2
AV Mean grad: 20 mmHg
AV Peak grad: 27.8 mmHg
Ao pk vel: 2.63 m/s
Area-P 1/2: 2.74 cm2
Calc EF: 61.5 %
MV M vel: 3.79 m/s
MV Peak grad: 57.5 mmHg
S' Lateral: 2.89 cm
Single Plane A2C EF: 59.9 %
Single Plane A4C EF: 64.5 %

## 2021-09-01 DIAGNOSIS — L570 Actinic keratosis: Secondary | ICD-10-CM | POA: Diagnosis not present

## 2021-09-01 DIAGNOSIS — C44622 Squamous cell carcinoma of skin of right upper limb, including shoulder: Secondary | ICD-10-CM | POA: Diagnosis not present

## 2021-09-01 DIAGNOSIS — D485 Neoplasm of uncertain behavior of skin: Secondary | ICD-10-CM | POA: Diagnosis not present

## 2021-09-01 DIAGNOSIS — L821 Other seborrheic keratosis: Secondary | ICD-10-CM | POA: Diagnosis not present

## 2021-09-01 DIAGNOSIS — L905 Scar conditions and fibrosis of skin: Secondary | ICD-10-CM | POA: Diagnosis not present

## 2021-09-01 DIAGNOSIS — Z85828 Personal history of other malignant neoplasm of skin: Secondary | ICD-10-CM | POA: Diagnosis not present

## 2021-09-01 DIAGNOSIS — L919 Hypertrophic disorder of the skin, unspecified: Secondary | ICD-10-CM | POA: Diagnosis not present

## 2021-09-09 DIAGNOSIS — C44622 Squamous cell carcinoma of skin of right upper limb, including shoulder: Secondary | ICD-10-CM | POA: Diagnosis not present

## 2021-10-11 NOTE — Progress Notes (Unsigned)
Cardiology Office Note  Date: 10/12/2021   ID: Sean Villarreal, DOB 09/30/1940, MRN [id number]  PCP:  Denny Levy, PA  Cardiologist:  Rozann Lesches, MD Electrophysiologist:  None   Chief Complaint  Patient presents with   Cardiac follow-up    History of Present Illness: Sean Villarreal is an 81 y.o. male last seen in March.  He is here for a routine visit.  No change in stamina, still walks about a mile each day.  Reports no angina and stable NYHA class II dyspnea.  No palpitations or syncope.  Recent follow-up echocardiogram showed LVEF 60 to 65%, normal estimated RVSP, and moderate calcific aortic stenosis with mean gradient 20 mmHg with dimensionless index 0.46.  We discussed these results today and will continue with observation for now.  I reviewed his medications.  He will have a physical with lab work per PCP in December.  He has not used any nitroglycerin.  Past Medical History:  Diagnosis Date   Arthritis    Basal cell carcinoma    Coronary artery disease    a. DES to LAD and LCx April 2014 b. PTCA ostial circumflex 03/2016 due to ISR c.  9/18 PCI/DESx2 osital Lcx (ISR), p/mLCx  d. s/p CABG on 01/24/2020 with LIMA-LAD and SVG-OM.   Dyslipidemia    Essential hypertension    GERD (gastroesophageal reflux disease)    Hematuria    History of blood transfusion 09/2012   History of kidney stones    NSTEMI (non-ST elevated myocardial infarction) ([place]) 08/2012   Type II diabetes mellitus ([facility])     Past Surgical History:  Procedure Laterality Date   BASAL CELL CARCINOMA EXCISION     "right cheek; both shoulders"   CARDIAC CATHETERIZATION     CATARACT EXTRACTION W/ INTRAOCULAR LENS  IMPLANT, BILATERAL Bilateral    COLONOSCOPY N/A 03/19/2016   Procedure: COLONOSCOPY;  Surgeon: Rogene Houston, MD;  [facility];  Service: Endoscopy;  Laterality: N/A;  9:15   CORONARY ARTERY BYPASS GRAFT N/A 01/24/2020   Procedure: CORONARY ARTERY BYPASS GRAFTING  (CABG), ON PUMP, TIMES TWO, USING LEFT INTERNAL MAMMARY ARTERY AND ENDOSCOPICALLY HARVESTED RIGHT GREATER SAPHENOUS VEIN;  Surgeon: Melrose Nakayama, MD;  Location: [facility];  Service: Open Heart Surgery;  Laterality: N/A;   CORONARY BALLOON ANGIOPLASTY N/A 04/15/2016   Procedure: Coronary Balloon Angioplasty;  Surgeon: Peter M Martinique, MD;  Location: [facility];  Service: Cardiovascular;  Laterality: N/A;   CORONARY STENT INTERVENTION N/A 10/11/2016   Procedure: CORONARY STENT INTERVENTION;  Surgeon: Jettie Booze, MD;  Location: [facility];  Service: Cardiovascular;  Laterality: N/A;   CYSTOSCOPY W/ URETEROSCOPY W/ LITHOTRIPSY  10/2012   /notes 11/10/2012   CYSTOSCOPY WITH STENT PLACEMENT  08/2012; 09/2012   EYE SURGERY Left ~ 02/2016   "for crinkled lens"   INGUINAL HERNIA REPAIR Left 12/24/2020   Procedure: HERNIA REPAIR INGUINAL WITH MESH;  Surgeon: Virl Cagey, MD;  Location: [facility];  Service: General;  Laterality: Left;   LEFT HEART CATH AND CORONARY ANGIOGRAPHY N/A 04/15/2016   Procedure: Left Heart Cath and Coronary Angiography;  Surgeon: Peter M Martinique, MD;  Location: [facility];  Service: Cardiovascular;  Laterality: N/A;   LEFT HEART CATH AND CORONARY ANGIOGRAPHY N/A 10/11/2016   Procedure: LEFT HEART CATH AND CORONARY ANGIOGRAPHY;  Surgeon: Jettie Booze, MD;  Location: [facility];  Service: Cardiovascular;  Laterality: N/A;   LEFT HEART CATH  AND CORONARY ANGIOGRAPHY N/A 03/31/2018   Procedure: LEFT HEART CATH AND CORONARY ANGIOGRAPHY;  Surgeon: Troy Sine, MD;  Location: [facility];  Service: Cardiovascular;  Laterality: N/A;   LEFT HEART CATH AND CORONARY ANGIOGRAPHY N/A 01/21/2020   Procedure: LEFT HEART CATH AND CORONARY ANGIOGRAPHY;  Surgeon: Belva Crome, MD;  Location: [facility];  Service: Cardiovascular;  Laterality: N/A;   LEFT HEART CATHETERIZATION WITH CORONARY ANGIOGRAM N/A 05/05/2012   Procedure: LEFT  HEART CATHETERIZATION WITH CORONARY ANGIOGRAM;  Surgeon: Burnell Blanks, MD;  Location: [facility];  Service: Cardiovascular;  Laterality: N/A;   LEFT HEART CATHETERIZATION WITH CORONARY ANGIOGRAM N/A 05/15/2012   Procedure: LEFT HEART CATHETERIZATION WITH CORONARY ANGIOGRAM;  Surgeon: Burnell Blanks, MD;  Location: [facility] CATH LAB;  Service: Cardiovascular;  Laterality: N/A;   TEE WITHOUT CARDIOVERSION N/A 01/24/2020   Procedure: TRANSESOPHAGEAL ECHOCARDIOGRAM (TEE);  Surgeon: Melrose Nakayama, MD;  Location: [facility];  Service: Open Heart Surgery;  Laterality: N/A;   TONSILLECTOMY  1948    Current Outpatient Medications  Medication Sig Dispense Refill   acetaminophen (TYLENOL) 500 MG tablet Take 500-1,000 mg by mouth every 6 (six) hours as needed (headaches).     amLODipine (NORVASC) 10 MG tablet Take 1 tablet once daily or as directed. 90 tablet 3   aspirin EC 81 MG tablet Take 81 mg by mouth in the morning. Swallow whole.     atorvastatin (LIPITOR) 80 MG tablet Take 80 mg by mouth at bedtime.     carvedilol (COREG) 25 MG tablet Take 1 tablet (25 mg total) by mouth 2 (two) times daily with a meal. 60 tablet 11   furosemide (LASIX) 20 MG tablet Take 1 tablet (20 mg total) by mouth daily. 90 tablet 3   glimepiride (AMARYL) 1 MG tablet Take 1 mg by mouth daily with breakfast.     latanoprost (XALATAN) 0.005 % ophthalmic solution SMARTSIG:In Eye(s)     lisinopril (ZESTRIL) 40 MG tablet Take 40 mg by mouth daily.     metFORMIN (GLUCOPHAGE) 1000 MG tablet Take 1,000 mg by mouth 2 (two) times daily with a meal.     nitroGLYCERIN (NITROSTAT) 0.4 MG SL tablet Place 0.4 mg under the tongue every 5 (five) minutes x 3 doses as needed for chest pain.     Polyethyl Glycol-Propyl Glycol (LUBRICANT EYE DROPS) 0.4-0.3 % SOLN Place 1-2 drops into both eyes 3 (three) times daily as needed (dry/irritated eyes).     potassium chloride SA (KLOR-CON M20) 20 MEQ tablet Take 2 tablets (40 mEq total) by  mouth daily. 180 tablet 3   psyllium (METAMUCIL SMOOTH TEXTURE) 28 % packet Take 1 packet by mouth at bedtime.     tamsulosin (FLOMAX) 0.4 MG CAPS capsule Take 0.4 mg by mouth 2 (two) times daily.     TRULICITY 2.13 YQ/6.5HQ SOPN Inject into the skin.     No current facility-administered medications for this visit.   Allergies:  Contrast media [iodinated contrast media], Penicillins, and Jardiance [empagliflozin]   ROS:  No syncope.  Physical Exam: VS:  BP 138/70   Pulse 86   Ht '5\' 10"'$  (1.778 m)   Wt 203 lb (92.1 kg)   SpO2 97%   BMI 29.13 kg/m , BMI Body mass index is 29.13 kg/m.  Wt Readings from Last 3 Encounters:  10/12/21 203 lb (92.1 kg)  04/08/21 209 lb 6.4 oz (95 kg)  01/22/21 201 lb (91.2 kg)    General:  Patient appears comfortable at rest. HEENT: Conjunctiva and lids normal. Neck: Supple, no elevated JVP or carotid bruits, no thyromegaly. Lungs: Clear to auscultation, nonlabored breathing at rest. Cardiac: Regular rate and rhythm, no S3, 3/6 systolic murmur. Abdomen: Soft, nontender, bowel sounds present. Extremities: No pitting edema.  ECG:  An ECG dated 04/08/2021 was personally reviewed today and demonstrated:  Sinus rhythm with right bundle branch block.  Recent Labwork: 12/05/2020: ALT 31; AST 23; BUN 11; Creatinine, Ser 0.77; Hemoglobin 15.5; Platelets 179; Potassium 3.5; Sodium 139   Other Studies Reviewed Today:  Echocardiogram 08/26/2021:  1. Left ventricular ejection fraction, by estimation, is 60 to 65%. The  left ventricle has normal function. The left ventricle has no regional  wall motion abnormalities. Left ventricular diastolic parameters were  normal. The average left ventricular  global longitudinal strain is -18.0 %. The global longitudinal strain is  normal.   2. Right ventricular systolic function is normal. The right ventricular  size is normal. There is normal pulmonary artery systolic pressure.   3. The mitral valve is normal in  structure. No evidence of mitral valve  regurgitation. No evidence of mitral stenosis.   4. The tricuspid valve is abnormal.   5. Highest mean gradient is with Pedhoff probe, mean gradient 20 mmHg. .  The aortic valve has an indeterminant number of cusps. There is severe  calcifcation of the aortic valve. There is severe thickening of the aortic  valve. Aortic valve regurgitation  is not visualized. Moderate aortic valve stenosis. Aortic valve mean  gradient measures 20.0 mmHg. Aortic valve peak gradient measures 27.8  mmHg. Aortic valve area, by VTI measures 1.17 cm.   6. The inferior vena cava is normal in size with greater than 50%  respiratory variability, suggesting right atrial pressure of 3 mmHg.   Assessment and Plan:  1.  Aortic stenosis, moderate range with mean gradient 20 mmHg and dimensionless index 0.46.  Continue to follow with repeat echocardiogram in 1 year, sooner if symptoms intervene.  We did discuss possibility of TAVR referral in the future.  2.  Multivessel CAD status post CABG in December 2021.  He is doing well without active angina, no nitroglycerin use.  Continue aspirin, Norvasc, Coreg, lisinopril, Lipitor, and as needed nitroglycerin.  3.  Mixed hyperlipidemia, tolerating Lipitor.  He will have follow-up lab work with PCP in December.  LDL has been controlled, previously 54.  Medication Adjustments/Labs and Tests Ordered: Current medicines are reviewed at length with the patient today.  Concerns regarding medicines are outlined above.   Tests Ordered: No orders of the defined types were placed in this encounter.   Medication Changes: No orders of the defined types were placed in this encounter.   Disposition:  Follow up  6 months.  Signed, Satira Sark, MD, [facility] 10/12/2021 8:33 AM    [facility], [place], [place] [address] Phone: [phone]; Fax: [phone]

## 2021-10-12 ENCOUNTER — Ambulatory Visit: Payer: Medicare Other | Attending: Cardiology | Admitting: Cardiology

## 2021-10-12 ENCOUNTER — Encounter: Payer: Self-pay | Admitting: Cardiology

## 2021-10-12 VITALS — BP 138/70 | HR 86 | Ht 70.0 in | Wt 203.0 lb

## 2021-10-12 DIAGNOSIS — I350 Nonrheumatic aortic (valve) stenosis: Secondary | ICD-10-CM | POA: Diagnosis not present

## 2021-10-12 DIAGNOSIS — E782 Mixed hyperlipidemia: Secondary | ICD-10-CM | POA: Insufficient documentation

## 2021-10-12 DIAGNOSIS — I25119 Atherosclerotic heart disease of native coronary artery with unspecified angina pectoris: Secondary | ICD-10-CM | POA: Diagnosis not present

## 2021-10-12 NOTE — Patient Instructions (Signed)

## 2021-11-03 DIAGNOSIS — Z961 Presence of intraocular lens: Secondary | ICD-10-CM | POA: Diagnosis not present

## 2021-11-03 DIAGNOSIS — Z9849 Cataract extraction status, unspecified eye: Secondary | ICD-10-CM | POA: Diagnosis not present

## 2021-11-03 DIAGNOSIS — E119 Type 2 diabetes mellitus without complications: Secondary | ICD-10-CM | POA: Diagnosis not present

## 2021-11-03 DIAGNOSIS — H401121 Primary open-angle glaucoma, left eye, mild stage: Secondary | ICD-10-CM | POA: Diagnosis not present

## 2021-11-03 DIAGNOSIS — H43813 Vitreous degeneration, bilateral: Secondary | ICD-10-CM | POA: Diagnosis not present

## 2021-11-03 DIAGNOSIS — H1045 Other chronic allergic conjunctivitis: Secondary | ICD-10-CM | POA: Diagnosis not present

## 2021-11-18 DIAGNOSIS — Z23 Encounter for immunization: Secondary | ICD-10-CM | POA: Diagnosis not present

## 2021-12-23 IMAGING — DX DG CHEST 1V PORT
2 series · 2 of 2 positions shown · non-contrast
Comparison: Chest x-ray dated January 20, 2020.

CLINICAL DATA: Post CABG.

EXAM:
PORTABLE CHEST 1 VIEW

[chest ap (1 of 2)]
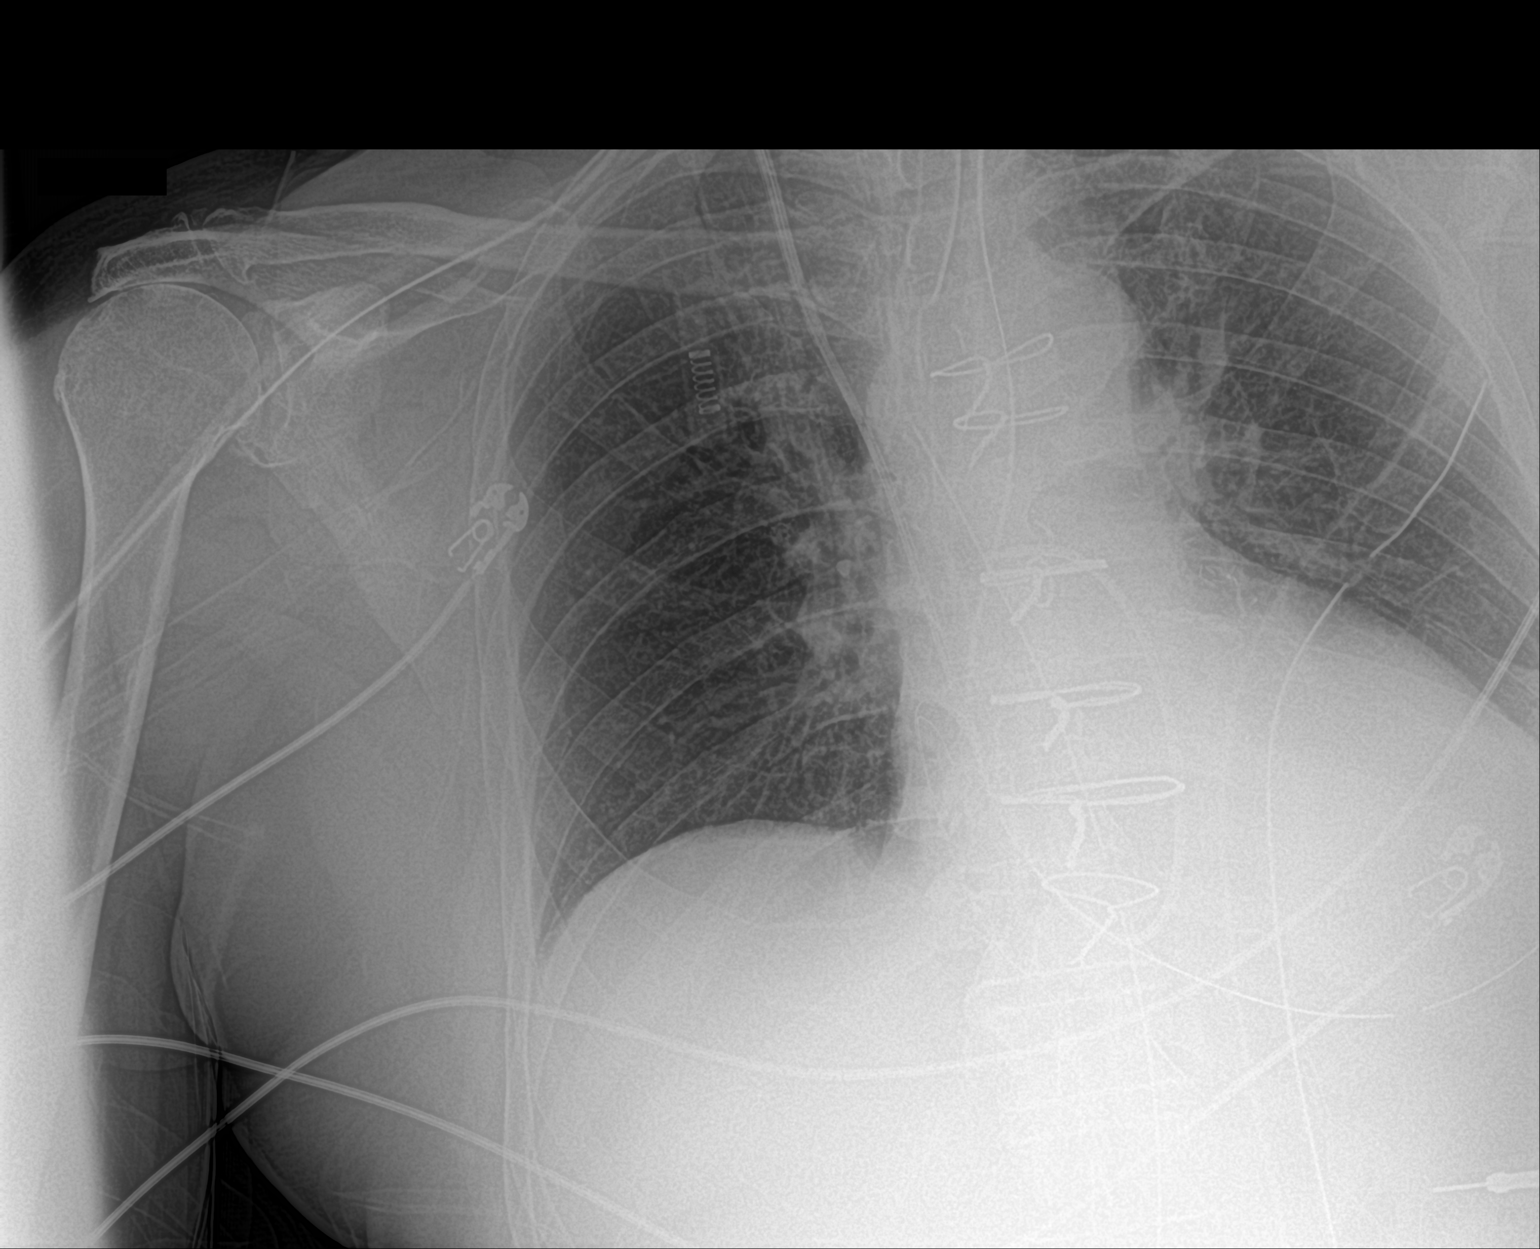

[chest ap (2 of 2)]
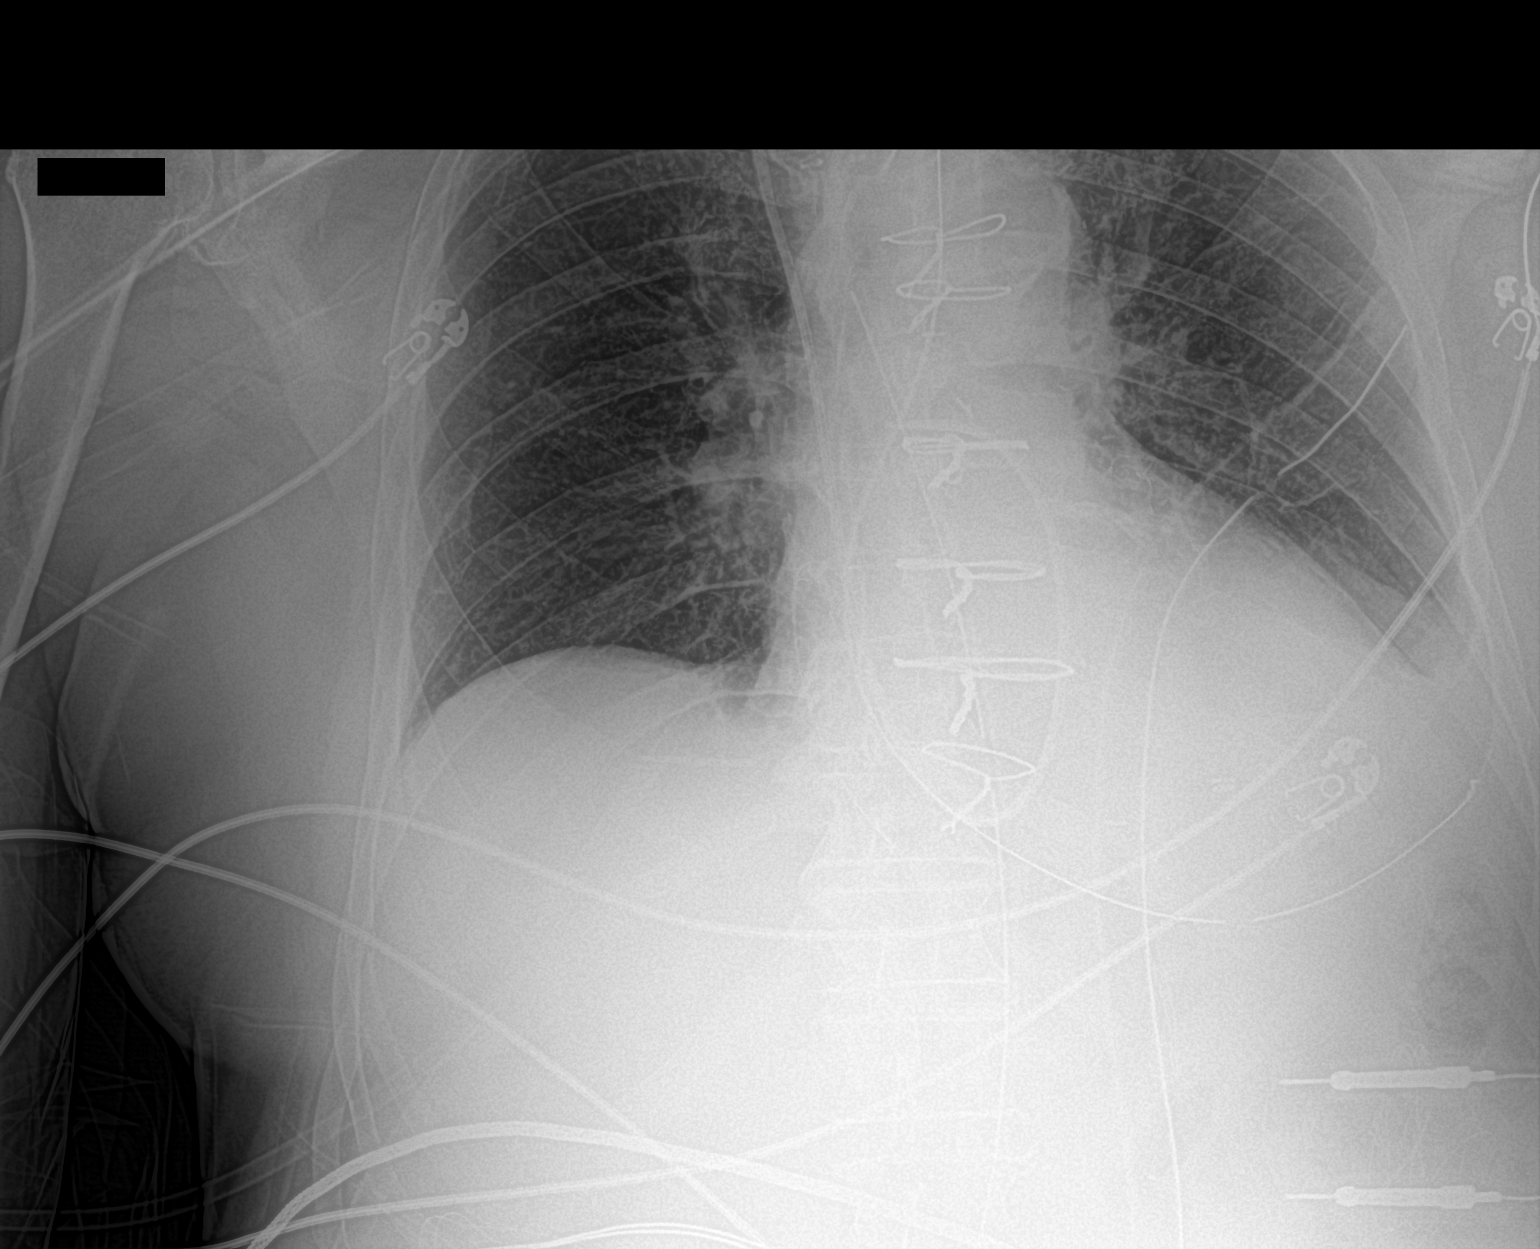

[2 of 2 positions shown; findings below may reference images not displayed]

FINDINGS: Endotracheal tube in position with the tip 3.5 cm above the level of
the carina. Enteric tube in the stomach. Right internal jugular
Swan-Ganz catheter with the tip in the main pulmonary outflow tract.
Mediastinal and left chest tubes are in good position.

Interval CABG. Normal heart size. Low lung volumes with
bronchovascular crowding. Left basilar opacity with small left
pleural effusion. No pneumothorax. No acute osseous abnormality.
IMPRESSION: 1. Appropriately positioned lines and tubes. No pneumothorax.
2. Left basilar atelectasis with small left pleural effusion.

## 2021-12-30 DIAGNOSIS — Z85828 Personal history of other malignant neoplasm of skin: Secondary | ICD-10-CM | POA: Diagnosis not present

## 2021-12-30 DIAGNOSIS — L219 Seborrheic dermatitis, unspecified: Secondary | ICD-10-CM | POA: Diagnosis not present

## 2021-12-30 DIAGNOSIS — L570 Actinic keratosis: Secondary | ICD-10-CM | POA: Diagnosis not present

## 2021-12-30 DIAGNOSIS — L905 Scar conditions and fibrosis of skin: Secondary | ICD-10-CM | POA: Diagnosis not present

## 2021-12-30 DIAGNOSIS — L821 Other seborrheic keratosis: Secondary | ICD-10-CM | POA: Diagnosis not present

## 2021-12-30 DIAGNOSIS — D1801 Hemangioma of skin and subcutaneous tissue: Secondary | ICD-10-CM | POA: Diagnosis not present

## 2022-01-14 DIAGNOSIS — E7800 Pure hypercholesterolemia, unspecified: Secondary | ICD-10-CM | POA: Diagnosis not present

## 2022-01-14 DIAGNOSIS — E1165 Type 2 diabetes mellitus with hyperglycemia: Secondary | ICD-10-CM | POA: Diagnosis not present

## 2022-01-14 DIAGNOSIS — I10 Essential (primary) hypertension: Secondary | ICD-10-CM | POA: Diagnosis not present

## 2022-01-14 DIAGNOSIS — N400 Enlarged prostate without lower urinary tract symptoms: Secondary | ICD-10-CM | POA: Diagnosis not present

## 2022-01-14 DIAGNOSIS — Z0001 Encounter for general adult medical examination with abnormal findings: Secondary | ICD-10-CM | POA: Diagnosis not present

## 2022-01-14 DIAGNOSIS — R739 Hyperglycemia, unspecified: Secondary | ICD-10-CM | POA: Diagnosis not present

## 2022-01-20 DIAGNOSIS — Z0000 Encounter for general adult medical examination without abnormal findings: Secondary | ICD-10-CM | POA: Diagnosis not present

## 2022-01-20 DIAGNOSIS — I10 Essential (primary) hypertension: Secondary | ICD-10-CM | POA: Diagnosis not present

## 2022-01-20 DIAGNOSIS — Z6830 Body mass index (BMI) 30.0-30.9, adult: Secondary | ICD-10-CM | POA: Diagnosis not present

## 2022-01-20 DIAGNOSIS — R109 Unspecified abdominal pain: Secondary | ICD-10-CM | POA: Diagnosis not present

## 2022-01-20 DIAGNOSIS — E1165 Type 2 diabetes mellitus with hyperglycemia: Secondary | ICD-10-CM | POA: Diagnosis not present

## 2022-01-29 DIAGNOSIS — Z87442 Personal history of urinary calculi: Secondary | ICD-10-CM | POA: Diagnosis not present

## 2022-01-29 DIAGNOSIS — R109 Unspecified abdominal pain: Secondary | ICD-10-CM | POA: Diagnosis not present

## 2022-03-03 ENCOUNTER — Other Ambulatory Visit: Payer: Self-pay | Admitting: Cardiology

## 2022-03-03 ENCOUNTER — Encounter: Payer: Self-pay | Admitting: *Deleted

## 2022-03-03 DIAGNOSIS — Z9849 Cataract extraction status, unspecified eye: Secondary | ICD-10-CM | POA: Diagnosis not present

## 2022-03-03 DIAGNOSIS — H401121 Primary open-angle glaucoma, left eye, mild stage: Secondary | ICD-10-CM | POA: Diagnosis not present

## 2022-03-03 DIAGNOSIS — H1045 Other chronic allergic conjunctivitis: Secondary | ICD-10-CM | POA: Diagnosis not present

## 2022-03-03 DIAGNOSIS — Z961 Presence of intraocular lens: Secondary | ICD-10-CM | POA: Diagnosis not present

## 2022-03-31 ENCOUNTER — Other Ambulatory Visit: Payer: Self-pay

## 2022-03-31 ENCOUNTER — Observation Stay (HOSPITAL_COMMUNITY)
Admission: EM | Admit: 2022-03-31 | Discharge: 2022-04-01 | Disposition: A | Discharge status: Home / Self Care | Payer: Medicare Other | Attending: Internal Medicine | Admitting: Internal Medicine

## 2022-03-31 ENCOUNTER — Observation Stay (HOSPITAL_BASED_OUTPATIENT_CLINIC_OR_DEPARTMENT_OTHER): Payer: Medicare Other

## 2022-03-31 ENCOUNTER — Encounter (HOSPITAL_COMMUNITY): Payer: Self-pay | Admitting: Pharmacy Technician

## 2022-03-31 ENCOUNTER — Emergency Department (HOSPITAL_COMMUNITY): Payer: Medicare Other

## 2022-03-31 DIAGNOSIS — Z87891 Personal history of nicotine dependence: Secondary | ICD-10-CM | POA: Insufficient documentation

## 2022-03-31 DIAGNOSIS — Z7901 Long term (current) use of anticoagulants: Secondary | ICD-10-CM | POA: Diagnosis not present

## 2022-03-31 DIAGNOSIS — Z85828 Personal history of other malignant neoplasm of skin: Secondary | ICD-10-CM | POA: Diagnosis not present

## 2022-03-31 DIAGNOSIS — R0602 Shortness of breath: Secondary | ICD-10-CM | POA: Insufficient documentation

## 2022-03-31 DIAGNOSIS — Z7982 Long term (current) use of aspirin: Secondary | ICD-10-CM | POA: Diagnosis not present

## 2022-03-31 DIAGNOSIS — R0600 Dyspnea, unspecified: Secondary | ICD-10-CM | POA: Diagnosis not present

## 2022-03-31 DIAGNOSIS — E785 Hyperlipidemia, unspecified: Secondary | ICD-10-CM | POA: Diagnosis present

## 2022-03-31 DIAGNOSIS — Z7984 Long term (current) use of oral hypoglycemic drugs: Secondary | ICD-10-CM | POA: Insufficient documentation

## 2022-03-31 DIAGNOSIS — R079 Chest pain, unspecified: Secondary | ICD-10-CM

## 2022-03-31 DIAGNOSIS — E119 Type 2 diabetes mellitus without complications: Secondary | ICD-10-CM | POA: Diagnosis not present

## 2022-03-31 DIAGNOSIS — I2510 Atherosclerotic heart disease of native coronary artery without angina pectoris: Secondary | ICD-10-CM | POA: Insufficient documentation

## 2022-03-31 DIAGNOSIS — R0789 Other chest pain: Principal | ICD-10-CM | POA: Insufficient documentation

## 2022-03-31 DIAGNOSIS — Z79899 Other long term (current) drug therapy: Secondary | ICD-10-CM | POA: Diagnosis not present

## 2022-03-31 DIAGNOSIS — Z951 Presence of aortocoronary bypass graft: Secondary | ICD-10-CM | POA: Insufficient documentation

## 2022-03-31 DIAGNOSIS — I10 Essential (primary) hypertension: Secondary | ICD-10-CM | POA: Diagnosis not present

## 2022-03-31 HISTORY — DX: Paroxysmal atrial fibrillation: I48.0

## 2022-03-31 HISTORY — DX: Nonrheumatic aortic (valve) stenosis: I35.0

## 2022-03-31 HISTORY — DX: Hyperlipidemia, unspecified: E78.5

## 2022-03-31 LAB — COMPREHENSIVE METABOLIC PANEL
ALT: 23 U/L (ref 0–44)
AST: 22 U/L (ref 15–41)
Albumin: 3.7 g/dL (ref 3.5–5.0)
Alkaline Phosphatase: 39 U/L (ref 38–126)
Anion gap: 8 (ref 5–15)
BUN: 11 mg/dL (ref 8–23)
CO2: 24 mmol/L (ref 22–32)
Calcium: 8.4 mg/dL — ABNORMAL LOW (ref 8.9–10.3)
Chloride: 107 mmol/L (ref 98–111)
Creatinine, Ser: 0.72 mg/dL (ref 0.61–1.24)
GFR, Estimated: >60 mL/min (ref >60)
Glucose, Bld: 151 mg/dL — ABNORMAL HIGH (ref 70–99)
Potassium: 3.5 mmol/L (ref 3.5–5.1)
Sodium: 139 mmol/L (ref 135–145)
Total Bilirubin: 1.3 mg/dL — ABNORMAL HIGH (ref 0.3–1.2)
Total Protein: 6.8 g/dL (ref 6.5–8.1)

## 2022-03-31 LAB — CBC WITH DIFFERENTIAL/PLATELET
Abs Immature Granulocytes: 0.04 10*3/uL (ref 0.00–0.07)
Basophils Absolute: 0 10*3/uL (ref 0.0–0.1)
Basophils Relative: 0 %
Eosinophils Absolute: 0.3 10*3/uL (ref 0.0–0.5)
Eosinophils Relative: 3 %
HCT: 43 % (ref 39.0–52.0)
Hemoglobin: 14.4 g/dL (ref 13.0–17.0)
Immature Granulocytes: 0 %
Lymphocytes Relative: 23 %
Lymphs Abs: 2 10*3/uL (ref 0.7–4.0)
MCH: 32.1 pg (ref 26.0–34.0)
MCHC: 33.5 g/dL (ref 30.0–36.0)
MCV: 96 fL (ref 80.0–100.0)
Monocytes Absolute: 0.9 10*3/uL (ref 0.1–1.0)
Monocytes Relative: 10 %
Neutro Abs: 5.8 10*3/uL (ref 1.7–7.7)
Neutrophils Relative %: 64 %
Platelets: 151 10*3/uL (ref 150–400)
RBC: 4.48 MIL/uL (ref 4.22–5.81)
RDW: 12.6 % (ref 11.5–15.5)
WBC: 9 10*3/uL (ref 4.0–10.5)
nRBC: 0 % (ref 0.0–0.2)

## 2022-03-31 LAB — MAGNESIUM: Magnesium: 1.3 mg/dL — ABNORMAL LOW (ref 1.7–2.4)

## 2022-03-31 LAB — TROPONIN I (HIGH SENSITIVITY)
Troponin I (High Sensitivity): 7 ng/L (ref <18)
Troponin I (High Sensitivity): 8 ng/L (ref <18)

## 2022-03-31 LAB — BRAIN NATRIURETIC PEPTIDE: B Natriuretic Peptide: 143 pg/mL — ABNORMAL HIGH (ref 0.0–100.0)

## 2022-03-31 LAB — CBG MONITORING, ED: Glucose-Capillary: 154 mg/dL — ABNORMAL HIGH (ref 70–99)

## 2022-03-31 LAB — GLUCOSE, CAPILLARY: Glucose-Capillary: 170 mg/dL — ABNORMAL HIGH (ref 70–99)

## 2022-03-31 MED ORDER — POLYVINYL ALCOHOL 1.4 % OP SOLN: 1.0000 [drp] | Freq: Three times a day (TID) | OPHTHALMIC | Status: DC | PRN

## 2022-03-31 MED ORDER — ACETAMINOPHEN 325 MG PO TABS: 650.0000 mg | ORAL_TABLET | Freq: Four times a day (QID) | ORAL | Status: DC | PRN

## 2022-03-31 MED ORDER — ENOXAPARIN SODIUM 40 MG/0.4ML IJ SOSY
40.0000 mg | PREFILLED_SYRINGE | INTRAMUSCULAR | Status: DC
  Administered 2022-03-31: 40 mg via SUBCUTANEOUS
  Filled 2022-03-31: qty 0.4

## 2022-03-31 MED ORDER — DIPHENHYDRAMINE HCL 25 MG PO CAPS
50.0000 mg | ORAL_CAPSULE | Freq: Once | ORAL | Status: AC
  Administered 2022-04-01: 50 mg via ORAL
  Filled 2022-03-31: qty 2

## 2022-03-31 MED ORDER — ATORVASTATIN CALCIUM 40 MG PO TABS: 80.0000 mg | ORAL_TABLET | Freq: Every day | ORAL | Status: DC

## 2022-03-31 MED ORDER — ASPIRIN 81 MG PO TBEC
81.0000 mg | DELAYED_RELEASE_TABLET | Freq: Every day | ORAL | Status: DC
  Administered 2022-04-01: 81 mg via ORAL
  Filled 2022-03-31: qty 1

## 2022-03-31 MED ORDER — DORZOLAMIDE HCL-TIMOLOL MAL 2-0.5 % OP SOLN
1.0000 [drp] | Freq: Two times a day (BID) | OPHTHALMIC | Status: DC
  Administered 2022-03-31 – 2022-04-01 (×2): 1 [drp] via OPHTHALMIC
  Filled 2022-03-31: qty 10

## 2022-03-31 MED ORDER — PREDNISONE 20 MG PO TABS
50.0000 mg | ORAL_TABLET | Freq: Four times a day (QID) | ORAL | Status: AC
  Administered 2022-03-31 – 2022-04-01 (×3): 50 mg via ORAL
  Filled 2022-03-31 (×3): qty 1

## 2022-03-31 MED ORDER — NITROGLYCERIN 0.4 MG SL SUBL
0.4000 mg | SUBLINGUAL_TABLET | Freq: Once | SUBLINGUAL | Status: DC
  Filled 2022-03-31: qty 1

## 2022-03-31 MED ORDER — FUROSEMIDE 10 MG/ML IJ SOLN
20.0000 mg | Freq: Once | INTRAMUSCULAR | Status: AC
  Administered 2022-03-31: 20 mg via INTRAVENOUS
  Filled 2022-03-31: qty 2

## 2022-03-31 MED ORDER — TAMSULOSIN HCL 0.4 MG PO CAPS
0.4000 mg | ORAL_CAPSULE | Freq: Two times a day (BID) | ORAL | Status: DC
  Administered 2022-03-31 – 2022-04-01 (×2): 0.4 mg via ORAL
  Filled 2022-03-31 (×2): qty 1

## 2022-03-31 MED ORDER — ONDANSETRON HCL 4 MG PO TABS: 4.0000 mg | ORAL_TABLET | Freq: Four times a day (QID) | ORAL | Status: DC | PRN

## 2022-03-31 MED ORDER — ACETAMINOPHEN 650 MG RE SUPP: 650.0000 mg | Freq: Four times a day (QID) | RECTAL | Status: DC | PRN

## 2022-03-31 MED ORDER — CARVEDILOL 12.5 MG PO TABS
25.0000 mg | ORAL_TABLET | Freq: Two times a day (BID) | ORAL | Status: DC
  Administered 2022-04-01: 25 mg via ORAL
  Filled 2022-03-31: qty 2

## 2022-03-31 MED ORDER — POTASSIUM CHLORIDE CRYS ER 20 MEQ PO TBCR
40.0000 meq | EXTENDED_RELEASE_TABLET | Freq: Every day | ORAL | Status: DC
  Administered 2022-04-01: 40 meq via ORAL
  Filled 2022-03-31: qty 2

## 2022-03-31 MED ORDER — INSULIN ASPART 100 UNIT/ML IJ SOLN
0.0000 [IU] | Freq: Three times a day (TID) | INTRAMUSCULAR | Status: DC
  Administered 2022-04-01 (×2): 3 [IU] via SUBCUTANEOUS

## 2022-03-31 MED ORDER — MAGNESIUM SULFATE 2 GM/50ML IV SOLN
2.0000 g | Freq: Once | INTRAVENOUS | Status: AC
  Administered 2022-03-31: 2 g via INTRAVENOUS
  Filled 2022-03-31: qty 50

## 2022-03-31 MED ORDER — DIPHENHYDRAMINE HCL 50 MG/ML IJ SOLN: 50.0000 mg | Freq: Once | INTRAMUSCULAR | Status: AC

## 2022-03-31 MED ORDER — ASPIRIN 81 MG PO CHEW
324.0000 mg | CHEWABLE_TABLET | Freq: Once | ORAL | Status: AC
  Administered 2022-03-31: 243 mg via ORAL
  Filled 2022-03-31: qty 4

## 2022-03-31 MED ORDER — FUROSEMIDE 40 MG PO TABS
20.0000 mg | ORAL_TABLET | Freq: Every day | ORAL | Status: DC
  Administered 2022-03-31 – 2022-04-01 (×2): 20 mg via ORAL
  Filled 2022-03-31 (×2): qty 1

## 2022-03-31 MED ORDER — ONDANSETRON HCL 4 MG/2ML IJ SOLN: 4.0000 mg | Freq: Four times a day (QID) | INTRAMUSCULAR | Status: DC | PRN

## 2022-03-31 NOTE — Progress Notes (Signed)
*  PRELIMINARY RESULTS* Echocardiogram 2D Echocardiogram has been performed.  Sean Villarreal 03/31/2022, 4:36 PM

## 2022-03-31 NOTE — ED Triage Notes (Signed)
Pt here POV with chest pain onset several days ago. States pain worsened last night. Pt with hx CABG and stents. States this feels similar to when he had to have stents placed. Pt endorses some shob and pain radiates into shoulders.

## 2022-03-31 NOTE — ED Provider Notes (Signed)
[facility] Provider Note   CSN: [id number] Arrival date & time: 03/31/22  0751     History  Chief Complaint  Patient presents with   Chest Pain    Sean Villarreal is a 82 y.o. male.  HPI Patient presents with his daughter who assists with the history.  Patient has history of CABG, heart failure, hypertension, takes his medication regularly has no recent changes in activity, diet.  However, the past few days patient has had chest tightness, radiating to both arms with occasional numbness in his left arm, occasional dyspnea, and intermittent lower extremity edema.    Home Medications Prior to Admission medications   Medication Sig Start Date End Date Taking? Authorizing Provider  amLODipine (NORVASC) 10 MG tablet Take 1 tablet once daily or as directed. 02/25/20  Yes Barrett, Evelene Croon, PA-C  aspirin EC 81 MG tablet Take 81 mg by mouth in the morning. Swallow whole.   Yes [provider]  atorvastatin (LIPITOR) 80 MG tablet Take 80 mg by mouth at bedtime. 05/06/12  Yes Edmisten, Azzie Roup, PA-C  carvedilol (COREG) 25 MG tablet Take 1 tablet (25 mg total) by mouth 2 (two) times daily with a meal. 05/15/12  Yes Barrett, Rhonda G, PA-C  COSOPT 2-0.5 % ophthalmic solution Place 1 drop into the left eye 2 (two) times daily. 03/03/22  Yes [provider]  furosemide (LASIX) 20 MG tablet TAKE 1 TABLET BY MOUTH DAILY 03/03/22  Yes Satira Sark, MD  glimepiride (AMARYL) 1 MG tablet Take 1 mg by mouth daily with breakfast.   Yes [provider]  latanoprost (XALATAN) 0.005 % ophthalmic solution SMARTSIG:In Eye(s) 10/06/21  Yes [provider]  lisinopril (ZESTRIL) 40 MG tablet Take 40 mg by mouth daily.   Yes [provider]  metFORMIN (GLUCOPHAGE) 1000 MG tablet Take 1,000 mg by mouth 2 (two) times daily with a meal.   Yes [provider]  nitroGLYCERIN (NITROSTAT) 0.4 MG SL tablet Place 0.4 mg  under the tongue every 5 (five) minutes x 3 doses as needed for chest pain.   Yes [provider]  Polyethyl Glycol-Propyl Glycol (LUBRICANT EYE DROPS) 0.4-0.3 % SOLN Place 1-2 drops into both eyes 3 (three) times daily as needed (dry/irritated eyes).   Yes [provider]  potassium chloride SA (KLOR-CON M20) 20 MEQ tablet Take 2 tablets (40 mEq total) by mouth daily. 04/08/21  Yes Satira Sark, MD  tamsulosin (FLOMAX) 0.4 MG CAPS capsule Take 0.4 mg by mouth 2 (two) times daily.   Yes [provider]  TRULICITY [id number] SOPN Inject into the skin. 09/29/21  Yes [provider]  acetaminophen (TYLENOL) 500 MG tablet Take 500-1,000 mg by mouth every 6 (six) hours as needed (headaches). Patient not taking: Reported on 03/31/2022    [provider]  psyllium (METAMUCIL SMOOTH TEXTURE) 28 % packet Take 1 packet by mouth at bedtime. Patient not taking: Reported on 03/31/2022    [provider]  amiodarone (PACERONE) 200 MG tablet Take 1 tablet (200 mg total) by mouth daily. 02/13/20 03/24/20  Ahmed Prima, Fransisco Hertz, PA-C  apixaban (ELIQUIS) 5 MG TABS tablet Take 1 tablet (5 mg total) by mouth 2 (two) times daily. 02/13/20 03/24/20  Erma Heritage, PA-C      Allergies    Contrast media [iodinated contrast media], Penicillins, and Jardiance [empagliflozin]    Review of Systems   Review of Systems  All other  systems reviewed and are negative.   Physical Exam Updated Vital Signs BP 115/66   Pulse 73   Temp 97.9 F (36.6 C) (Oral)   Resp 12   SpO2 96%  Physical Exam Vitals and nursing note reviewed.  Constitutional:      General: He is not in acute distress.    Appearance: He is well-developed.  HENT:     Head: Normocephalic and atraumatic.  Eyes:     Conjunctiva/sclera: Conjunctivae normal.  Cardiovascular:     Rate and Rhythm: Normal rate and regular rhythm.  Pulmonary:     Effort: Pulmonary effort is normal. No respiratory  distress.     Breath sounds: No stridor.  Abdominal:     General: There is no distension.  Musculoskeletal:     Right lower leg: Edema present.     Left lower leg: Edema present.  Skin:    General: Skin is warm and dry.  Neurological:     Mental Status: He is alert and oriented to person, place, and time.     ED Results / Procedures / Treatments   Labs (all labs ordered are listed, but only abnormal results are displayed) Labs Reviewed  COMPREHENSIVE METABOLIC PANEL - Abnormal; Notable for the following components:      Result Value   Glucose, Bld 151 (*)    Calcium 8.4 (*)    Total Bilirubin 1.3 (*)    All other components within normal limits  MAGNESIUM - Abnormal; Notable for the following components:   Magnesium 1.3 (*)    All other components within normal limits  BRAIN NATRIURETIC PEPTIDE - Abnormal; Notable for the following components:   B Natriuretic Peptide 143.0 (*)    All other components within normal limits  CBG MONITORING, ED - Abnormal; Notable for the following components:   Glucose-Capillary 154 (*)    All other components within normal limits  CBC WITH DIFFERENTIAL/PLATELET  TROPONIN I (HIGH SENSITIVITY)  TROPONIN I (HIGH SENSITIVITY)    EKG EKG Interpretation  Date/Time:  Wednesday March 31 2022 08:02:51 EST Ventricular Rate:  93 PR Interval:  167 QRS Duration: 139 QT Interval:  395 QTC Calculation: 492 R Axis:   -3 Text Interpretation: Sinus rhythm Right bundle branch block T wave abnormality Abnormal ECG Confirmed by Carmin Muskrat [phone]) on 03/31/2022 8:10:07 AM  Radiology DG Chest Portable 1 View  Result Date: 03/31/2022 CLINICAL DATA:  Chest pain EXAM: PORTABLE CHEST 1 VIEW COMPARISON:  02/26/2020 FINDINGS: Normal heart size status post sternotomy and CABG. Aortic atherosclerosis. Slightly low lung volumes. No focal airspace consolidation, pleural effusion, or pneumothorax. IMPRESSION: No active disease. Electronically Signed   By: Davina Poke D.O.   On: 03/31/2022 08:21    Procedures Procedures    Medications Ordered in ED Medications  nitroGLYCERIN (NITROSTAT) SL tablet 0.4 mg (0 mg Sublingual Hold 03/31/22 1148)  aspirin chewable tablet 324 mg (243 mg Oral Given 03/31/22 0818)  magnesium sulfate IVPB 2 g 50 mL (0 g Intravenous Stopped 03/31/22 1032)  furosemide (LASIX) injection 20 mg (20 mg Intravenous Given 03/31/22 [id number])    ED Course/ Medical Decision Making/ A&P                             Medical Decision Making Patient with history of CABG, hypertension, heart failure presents with chest pain.  Differential includes heart failure exacerbation, ACS, infection, medication interaction, infection.  Cardiac 90 sinus normal Pulse ox  99% room air normal   Amount and/or Complexity of Data Reviewed Independent Historian: caregiver External Data Reviewed: notes. Labs: ordered. Decision-making details documented in ED Course. Radiology: ordered and independent interpretation performed. Decision-making details documented in ED Course. ECG/medicine tests: ordered and independent interpretation performed. Decision-making details documented in ED Course. Discussion of management or test interpretation with external provider(s): I discussed the patient's case with Dr. Harl Bowie, one of the patient's cardiologist's colleagues.  With concern for heart score of 6, elevated BNP, hypomagnesemia patient will be admitted overnight with cardiology consulting.  Risk OTC drugs. Prescription drug management. Decision regarding hospitalization.   2:42 PM Patient in similar condition, has had successful diuresis. This adult male with a heart score of 6, multiple other medical problems presents with chest pain.  Patient's troponins are normal, there is no early evidence for acute ischemia, but given his ongoing pain, concern for fluid overload status, hypomagnesemia he received interventions as above, was admitted for further monitoring,  management.        Final Clinical Impression(s) / ED Diagnoses Final diagnoses:  Atypical chest pain     Carmin Muskrat, MD 03/31/22 1443

## 2022-03-31 NOTE — H&P (Signed)
History and Physical    Patient: Sean Villarreal [id number] DOB: Mar 05, 1940 DOA: 03/31/2022 DOS: the patient was seen and examined on 03/31/2022 PCP: Denny Levy, [place]  Patient coming from: Home  Chief Complaint:  Chief Complaint  Patient presents with   Chest Pain   HPI: WHIT TONES is a 82 year old male with a history of coronary artery disease status post CABG December 2021, hypertension, diabetes mellitus type 2, hyperlipidemia presenting with substernal chest pain.  The patient states that he has had some intermittent substernal chest pain that began on 03/27/22.  He stated that initial episode lasted about an hour while he was driving.  Since then, he has had intermittent episodes of sharp substernal chest pain lasting about a minute not necessarily worsening with exertion.  However he has had some dyspnea on exertion in the past week.  Patient states that his chest pain would radiate straight to his back between his shoulder blades.  He has had some intermittent left arm discomfort.  He denies any fevers, chills, headache, cough, hemoptysis, nausea, vomiting, direct abdominal pain.  He took 1 nitroglycerin sublingual on the morning of 03/31/22 with some relief of his chest discomfort.  He has noted some pedal edema but denies any frank orthopnea or increasing abdominal girth. In the ED, the patient was afebrile and hemodynamically stable with oxygen saturation 96% on room air.  WBC 9.0, hemoglobin 14.4, platelets 151,000.  Sodium 139, potassium 3.5, bicarbonate 24, serum creatinine 0.72.  LFTs were unremarkable.  Magnesium 1.3.  Troponin 7>> 8.  BNP was 143.  Chest x-ray was negative for any pulmonary edema or consolidation.  Pt was given lasix 20 mg IV x 1 in ED.  Cardiology was consulted to assist with management.  Review of Systems: As mentioned in the history of present illness. All other systems reviewed and are negative. Past Medical History:  Diagnosis Date   Arthritis     Basal cell carcinoma    Coronary artery disease    a. DES to LAD and LCx April 2014 b. PTCA ostial circumflex 03/2016 due to ISR c.  9/18 PCI/DESx2 osital Lcx (ISR), p/mLCx  d. s/p CABG on 01/24/2020 with LIMA-LAD and SVG-OM.   Dyslipidemia    Essential hypertension    GERD (gastroesophageal reflux disease)    Hematuria    History of blood transfusion 09/2012   History of kidney stones    NSTEMI (non-ST elevated myocardial infarction) ([place]) 08/2012   Type II diabetes mellitus ([place])    Past Surgical History:  Procedure Laterality Date   BASAL CELL CARCINOMA EXCISION     "right cheek; both shoulders"   CARDIAC CATHETERIZATION     CATARACT EXTRACTION W/ INTRAOCULAR LENS  IMPLANT, BILATERAL Bilateral    COLONOSCOPY N/A 03/19/2016   Procedure: COLONOSCOPY;  Surgeon: Rogene Houston, MD;  [facility];  Service: Endoscopy;  Laterality: N/A;  9:15   CORONARY ARTERY BYPASS GRAFT N/A 01/24/2020   Procedure: CORONARY ARTERY BYPASS GRAFTING (CABG), ON PUMP, TIMES TWO, USING LEFT INTERNAL MAMMARY ARTERY AND ENDOSCOPICALLY HARVESTED RIGHT GREATER SAPHENOUS VEIN;  Surgeon: Melrose Nakayama, MD;  Location: [facility];  Service: Open Heart Surgery;  Laterality: N/A;   CORONARY BALLOON ANGIOPLASTY N/A 04/15/2016   Procedure: Coronary Balloon Angioplasty;  Surgeon: Peter M Martinique, MD;  Location: [facility];  Service: Cardiovascular;  Laterality: N/A;   CORONARY STENT INTERVENTION N/A 10/11/2016   Procedure: CORONARY STENT INTERVENTION;  Surgeon: Jettie Booze, MD;  Location:  [facility];  Service: Cardiovascular;  Laterality: N/A;   CYSTOSCOPY W/ URETEROSCOPY W/ LITHOTRIPSY  10/2012   /notes 11/10/2012   CYSTOSCOPY WITH STENT PLACEMENT  08/2012; 09/2012   EYE SURGERY Left ~ 02/2016   "for crinkled lens"   INGUINAL HERNIA REPAIR Left 12/24/2020   Procedure: HERNIA REPAIR INGUINAL WITH MESH;  Surgeon: Virl Cagey, MD;  Location: [facility];  Service: General;  Laterality:  Left;   LEFT HEART CATH AND CORONARY ANGIOGRAPHY N/A 04/15/2016   Procedure: Left Heart Cath and Coronary Angiography;  Surgeon: Peter M Martinique, MD;  Location: [place] [facility];  Service: Cardiovascular;  Laterality: N/A;   LEFT HEART CATH AND CORONARY ANGIOGRAPHY N/A 10/11/2016   Procedure: LEFT HEART CATH AND CORONARY ANGIOGRAPHY;  Surgeon: Jettie Booze, MD;  Location: [place] [facility];  Service: Cardiovascular;  Laterality: N/A;   LEFT HEART CATH AND CORONARY ANGIOGRAPHY N/A 03/31/2018   Procedure: LEFT HEART CATH AND CORONARY ANGIOGRAPHY;  Surgeon: Troy Sine, MD;  Location: [place] [facility];  Service: Cardiovascular;  Laterality: N/A;   LEFT HEART CATH AND CORONARY ANGIOGRAPHY N/A 01/21/2020   Procedure: LEFT HEART CATH AND CORONARY ANGIOGRAPHY;  Surgeon: Belva Crome, MD;  Location: [place] [facility];  Service: Cardiovascular;  Laterality: N/A;   LEFT HEART CATHETERIZATION WITH CORONARY ANGIOGRAM N/A 05/05/2012   Procedure: LEFT HEART CATHETERIZATION WITH CORONARY ANGIOGRAM;  Surgeon: Burnell Blanks, MD;  Location: [facility];  Service: Cardiovascular;  Laterality: N/A;   LEFT HEART CATHETERIZATION WITH CORONARY ANGIOGRAM N/A 05/15/2012   Procedure: LEFT HEART CATHETERIZATION WITH CORONARY ANGIOGRAM;  Surgeon: Burnell Blanks, MD;  Location: [facility];  Service: Cardiovascular;  Laterality: N/A;   TEE WITHOUT CARDIOVERSION N/A 01/24/2020   Procedure: TRANSESOPHAGEAL ECHOCARDIOGRAM (TEE);  Surgeon: Melrose Nakayama, MD;  Location: [place];  Service: Open Heart Surgery;  Laterality: N/A;   TONSILLECTOMY  1947   Social History:  reports that he quit smoking about 33 years ago. His smoking use included cigarettes. He started smoking about 40 years ago. He has a 22.50 pack-year smoking history. He has never used smokeless tobacco. He reports that he does not currently use alcohol. He reports that he does not use drugs.  Allergies  Allergen Reactions    Contrast Media [Iodinated Contrast Media] Hives and Other (See Comments)    Had "welts" on arm, and kidney issues after given dye in the past   Penicillins Other (See Comments)    Passed out as a child Has patient had a PCN reaction causing immediate rash, facial/tongue/throat swelling, SOB or lightheadedness with hypotension: Unk Has patient had a PCN reaction causing severe rash involving mucus membranes or skin necrosis: Unk Has patient had a PCN reaction that required hospitalization: Unk Has patient had a PCN reaction occurring within the last 10 years: No If all of the above answers are "NO", then may proceed with Cephalosporin use.    Jardiance [Empagliflozin] Diarrhea    Family History  Problem Relation Age of Onset   Heart disease Mother    Heart failure Mother    Heart disease Father    Heart attack Father    Colon cancer Neg Hx     Prior to Admission medications   Medication Sig Start Date End Date Taking? Authorizing Provider  amLODipine (NORVASC) 10 MG tablet Take 1 tablet once daily or as directed. 02/25/20  Yes Barrett, Evelene Croon, PA-C  aspirin EC 81 MG tablet Take  81 mg by mouth in the morning. Swallow whole.   Yes [provider]  atorvastatin (LIPITOR) 80 MG tablet Take 80 mg by mouth at bedtime. 05/06/12  Yes Edmisten, Azzie Roup, PA-C  carvedilol (COREG) 25 MG tablet Take 1 tablet (25 mg total) by mouth 2 (two) times daily with a meal. 05/15/12  Yes Barrett, Rhonda G, PA-C  COSOPT 2-0.5 % ophthalmic solution Place 1 drop into the left eye 2 (two) times daily. 03/03/22  Yes [provider]  furosemide (LASIX) 20 MG tablet TAKE 1 TABLET BY MOUTH DAILY 03/03/22  Yes Satira Sark, MD  glimepiride (AMARYL) 1 MG tablet Take 1 mg by mouth daily with breakfast.   Yes [provider]  latanoprost (XALATAN) 0.005 % ophthalmic solution SMARTSIG:In Eye(s) 10/06/21  Yes [provider]  lisinopril (ZESTRIL) 40 MG tablet Take 40 mg by mouth daily.    Yes [provider]  metFORMIN (GLUCOPHAGE) 1000 MG tablet Take 1,000 mg by mouth 2 (two) times daily with a meal.   Yes [provider]  nitroGLYCERIN (NITROSTAT) 0.4 MG SL tablet Place 0.4 mg under the tongue every 5 (five) minutes x 3 doses as needed for chest pain.   Yes [provider]  Polyethyl Glycol-Propyl Glycol (LUBRICANT EYE DROPS) 0.4-0.3 % SOLN Place 1-2 drops into both eyes 3 (three) times daily as needed (dry/irritated eyes).   Yes [provider]  potassium chloride SA (KLOR-CON M20) 20 MEQ tablet Take 2 tablets (40 mEq total) by mouth daily. 04/08/21  Yes Satira Sark, MD  tamsulosin (FLOMAX) 0.4 MG CAPS capsule Take 0.4 mg by mouth 2 (two) times daily.   Yes [provider]  TRULICITY [id number] [id number] SOPN Inject into the skin. 09/29/21  Yes [provider]  acetaminophen (TYLENOL) 500 MG tablet Take 500-1,000 mg by mouth every 6 (six) hours as needed (headaches). Patient not taking: Reported on 03/31/2022    [provider]  psyllium (METAMUCIL SMOOTH TEXTURE) 28 % packet Take 1 packet by mouth at bedtime. Patient not taking: Reported on 03/31/2022    [provider]  amiodarone (PACERONE) 200 MG tablet Take 1 tablet (200 mg total) by mouth daily. 02/13/20 03/24/20  Ahmed Prima, Fransisco Hertz, PA-C  apixaban (ELIQUIS) 5 MG TABS tablet Take 1 tablet (5 mg total) by mouth 2 (two) times daily. 02/13/20 03/24/20  Erma Heritage, PA-C    Physical Exam: Vitals:   03/31/22 1219 03/31/22 1230 03/31/22 1300 03/31/22 1430  BP:  122/75 115/66 137/79  Pulse:  74 73 73  Resp:  '15 12 16  '$ Temp: 97.9 F (36.6 C)     TempSrc: Oral     SpO2:  95% 96% 97%   GENERAL:  A&O x 3, NAD, well developed, cooperative, follows commands HEENT: [place]/AT, No thrush, No icterus, No oral ulcers Neck:  No neck mass, No meningismus, soft, supple CV: RRR, no S3, no S4, no rub, no JVD Lungs:  CTA, no wheeze, no rhonchi, good air movement Abd:  soft/NT +BS, nondistended Ext: No edema, no lymphangitis, no cyanosis, no rashes Neuro:  CN II-XII intact, strength 4/5 in RUE, RLE, strength 4/5 LUE, LLE; sensation intact bilateral; no dysmetria; babinski equivocal  Data Reviewed: Results reviewed above in history  Assessment and Plan: Chest pain coronary disease -Concerned about anginal -Troponins unremarkable -Personally reviewed EKG--sinus, RBBB -CTA chest rule out dissection -Continue aspirin  Essential hypertension -Continue carvedilol -Holding amlodipine and lisinopril  Diabetes mellitus type 2 -Hemoglobin A1c -  NovoLog sliding scale -Holding Trulicity, glipizide, metformin  Mixed hyperlipidemia -Continues statin   Advance Care Planning: FULL CODE  Consults: cardiology  Family Communication: none  Severity of Illness: The appropriate patient status for this patient is OBSERVATION. Observation status is judged to be reasonable and necessary in order to provide the required intensity of service to ensure the patient's safety. The patient's presenting symptoms, physical exam findings, and initial radiographic and laboratory data in the context of their medical condition is felt to place them at decreased risk for further clinical deterioration. Furthermore, it is anticipated that the patient will be medically stable for discharge from the hospital within 2 midnights of admission.   Author: Orson Eva, MD 03/31/2022 3:07 PM  For on call review [email or web address].

## 2022-03-31 NOTE — ED Notes (Signed)
Pt took 1 '81mg'$  aspirin pta.

## 2022-03-31 NOTE — Hospital Course (Addendum)
82 year old male with a history of coronary artery disease status post CABG December 2021, hypertension, diabetes mellitus type 2, hyperlipidemia presenting with substernal chest pain.  The patient states that he has had some intermittent substernal chest pain that began on 03/27/22.  He stated that initial episode lasted about an hour while he was driving.  Since then, he has had intermittent episodes of sharp substernal chest pain lasting about a minute not necessarily worsening with exertion.  However he has had some dyspnea on exertion in the past week.  Patient states that his chest pain would radiate straight to his back between his shoulder blades.  He has had some intermittent left arm discomfort.  He denies any fevers, chills, headache, cough, hemoptysis, nausea, vomiting, direct abdominal pain.  He took 1 nitroglycerin sublingual on the morning of 03/31/22 with some relief of his chest discomfort.  He has noted some pedal edema but denies any frank orthopnea or increasing abdominal girth. In the ED, the patient was afebrile and hemodynamically stable with oxygen saturation 96% on room air.  WBC 9.0, hemoglobin 14.4, platelets 151,000.  Sodium 139, potassium 3.5, bicarbonate 24, serum creatinine 0.72.  LFTs were unremarkable.  Magnesium 1.3.  Troponin 7>> 8.  BNP was 143.  Chest x-ray was negative for any pulmonary edema or consolidation.  Pt was given lasix 20 mg IV x 1 in ED.  Cardiology was consulted to assist with management.

## 2022-04-01 ENCOUNTER — Observation Stay (HOSPITAL_COMMUNITY): Payer: Medicare Other

## 2022-04-01 ENCOUNTER — Observation Stay (HOSPITAL_BASED_OUTPATIENT_CLINIC_OR_DEPARTMENT_OTHER): Payer: Medicare Other

## 2022-04-01 ENCOUNTER — Encounter (HOSPITAL_COMMUNITY): Payer: Self-pay | Admitting: Internal Medicine

## 2022-04-01 DIAGNOSIS — E785 Hyperlipidemia, unspecified: Secondary | ICD-10-CM | POA: Diagnosis not present

## 2022-04-01 DIAGNOSIS — I10 Essential (primary) hypertension: Secondary | ICD-10-CM | POA: Diagnosis not present

## 2022-04-01 DIAGNOSIS — R000 Tachycardia, unspecified: Secondary | ICD-10-CM | POA: Diagnosis not present

## 2022-04-01 DIAGNOSIS — R072 Precordial pain: Secondary | ICD-10-CM | POA: Diagnosis not present

## 2022-04-01 DIAGNOSIS — I350 Nonrheumatic aortic (valve) stenosis: Secondary | ICD-10-CM

## 2022-04-01 DIAGNOSIS — R0789 Other chest pain: Secondary | ICD-10-CM | POA: Diagnosis not present

## 2022-04-01 DIAGNOSIS — R079 Chest pain, unspecified: Secondary | ICD-10-CM | POA: Diagnosis not present

## 2022-04-01 LAB — NM MYOCAR MULTI W/SPECT W/WALL MOTION / EF
Estimated workload: 1
Exercise duration (min): 0 min
Exercise duration (sec): 0 s
LV dias vol: 50 mL (ref 62–150)
LV sys vol: 17 mL
MPHR: 140 {beats}/min
Nuc Stress EF: 67 %
Peak HR: 110 {beats}/min
Percent HR: 78 %
RATE: 0.3
Rest HR: 86 {beats}/min
Rest Nuclear Isotope Dose: 10.6 mCi
SDS: 0
SRS: 0
SSS: 0
ST Depression (mm): 0 mm
Stress Nuclear Isotope Dose: 32 mCi
TID: 0.68

## 2022-04-01 LAB — BASIC METABOLIC PANEL
Anion gap: 12 (ref 5–15)
BUN: 14 mg/dL (ref 8–23)
CO2: 26 mmol/L (ref 22–32)
Calcium: 8.4 mg/dL — ABNORMAL LOW (ref 8.9–10.3)
Chloride: 99 mmol/L (ref 98–111)
Creatinine, Ser: 0.81 mg/dL (ref 0.61–1.24)
GFR, Estimated: >60 mL/min (ref >60)
Glucose, Bld: 212 mg/dL — ABNORMAL HIGH (ref 70–99)
Potassium: 3.6 mmol/L (ref 3.5–5.1)
Sodium: 137 mmol/L (ref 135–145)

## 2022-04-01 LAB — ECHOCARDIOGRAM COMPLETE
AR max vel: 2.64 cm2
AV Area VTI: 2.8 cm2
AV Area mean vel: 2.54 cm2
AV Mean grad: 16.5 mmHg
AV Peak grad: 28.7 mmHg
Ao pk vel: 2.68 m/s
Area-P 1/2: 2.34 cm2
MV VTI: 3.93 cm2
S' Lateral: 3.1 cm

## 2022-04-01 LAB — HEMOGLOBIN A1C
Hgb A1c MFr Bld: 7.1 % — ABNORMAL HIGH (ref 4.8–5.6)
Mean Plasma Glucose: 157 mg/dL

## 2022-04-01 LAB — GLUCOSE, CAPILLARY
Glucose-Capillary: 229 mg/dL — ABNORMAL HIGH (ref 70–99)
Glucose-Capillary: 240 mg/dL — ABNORMAL HIGH (ref 70–99)

## 2022-04-01 MED ORDER — TECHNETIUM TC 99M TETROFOSMIN IV KIT
32.0000 | PACK | Freq: Once | INTRAVENOUS | Status: AC | PRN
  Administered 2022-04-01: 32 via INTRAVENOUS

## 2022-04-01 MED ORDER — IOHEXOL 350 MG/ML SOLN
75.0000 mL | Freq: Once | INTRAVENOUS | Status: AC | PRN
  Administered 2022-04-01: 75 mL via INTRAVENOUS

## 2022-04-01 MED ORDER — REGADENOSON 0.4 MG/5ML IV SOLN
INTRAVENOUS | Status: AC
  Filled 2022-04-01: qty 5

## 2022-04-01 MED ORDER — ISOSORBIDE MONONITRATE ER 30 MG PO TB24: 15.0000 mg | ORAL_TABLET | Freq: Every day | ORAL | 1 refills | Status: DC

## 2022-04-01 MED ORDER — SODIUM CHLORIDE FLUSH 0.9 % IV SOLN
INTRAVENOUS | Status: AC
  Administered 2022-04-01: 10 mL via INTRAVENOUS
  Filled 2022-04-01: qty 10

## 2022-04-01 MED ORDER — TECHNETIUM TC 99M TETROFOSMIN IV KIT
10.6000 | PACK | Freq: Once | INTRAVENOUS | Status: AC | PRN
  Administered 2022-04-01: 10.6 via INTRAVENOUS

## 2022-04-01 MED ORDER — ISOSORBIDE MONONITRATE ER 30 MG PO TB24: 15.0000 mg | ORAL_TABLET | Freq: Every day | ORAL | Status: DC

## 2022-04-01 NOTE — Hospital Course (Signed)
BNP 143. HsTrops 7  9

## 2022-04-01 NOTE — Discharge Summary (Signed)
Physician Discharge Summary   Patient: Sean Villarreal MRN: [id number] DOB: 1940-05-08  Admit date:     03/31/2022  Discharge date: 04/01/22  Discharge Physician: Shanon Brow Frimet Durfee   PCP: Denny Levy, PA   Recommendations at discharge:   Please follow up with primary care provider within 1-2 weeks  Please repeat BMP and CBC in one week   Hospital Course: 82 year old male with a history of coronary artery disease status post CABG December 2021, hypertension, diabetes mellitus type 2, hyperlipidemia presenting with substernal chest pain.  The patient states that he has had some intermittent substernal chest pain that began on 03/27/22.  He stated that initial episode lasted about an hour while he was driving.  Since then, he has had intermittent episodes of sharp substernal chest pain lasting about a minute not necessarily worsening with exertion.  However he has had some dyspnea on exertion in the past week.  Patient states that his chest pain would radiate straight to his back between his shoulder blades.  He has had some intermittent left arm discomfort.  He denies any fevers, chills, headache, cough, hemoptysis, nausea, vomiting, direct abdominal pain.  He took 1 nitroglycerin sublingual on the morning of 03/31/22 with some relief of his chest discomfort.  He has noted some pedal edema but denies any frank orthopnea or increasing abdominal girth. In the ED, the patient was afebrile and hemodynamically stable with oxygen saturation 96% on room air.  WBC 9.0, hemoglobin 14.4, platelets 151,000.  Sodium 139, potassium 3.5, bicarbonate 24, serum creatinine 0.72.  LFTs were unremarkable.  Magnesium 1.3.  Troponin 7>> 8.  BNP was 143.  Chest x-ray was negative for any pulmonary edema or consolidation.  Pt was given lasix 20 mg IV x 1 in ED.  Cardiology was consulted to assist with management.  Assessment and Plan: Chest pain/ coronary disease -Concerned about angina -Troponins unremarkable -Personally  reviewed EKG--sinus, RBBB -CTA chest --negative PE, negative dissection -Continue aspirin -03/31/22 Echo--EF 60-65%, no WMA, mild/mod AS -appreciate cardiology -04/01/22 NM stress test--low risk, 67% -added imdur   Essential hypertension -Continue carvedilol -Holding amlodipine and lisinopril -will not restart amlodipine due well controlled BP and LE edema -instructed pt to check BPs at home -he has follow up with Dr. Domenic Polite on 4/18   Diabetes mellitus type 2 -Hemoglobin A1c--pending at time of d/c -NovoLog sliding scale -Holding Trulicity, glipizide, metformin>>restart after d/c   Mixed hyperlipidemia -Continues statin         Consultants: cardiology Procedures performed: NM stress test  Disposition: Home Diet recommendation:  Carb modified diet DISCHARGE MEDICATION: Allergies as of 04/01/2022       Reactions   Contrast Media [iodinated Contrast Media] Hives, Other (See Comments)   Had "welts" on arm, and kidney issues after given dye in the past   Penicillins Other (See Comments)   Passed out as a child Has patient had a PCN reaction causing immediate rash, facial/tongue/throat swelling, SOB or lightheadedness with hypotension: Unk Has patient had a PCN reaction causing severe rash involving mucus membranes or skin necrosis: Unk Has patient had a PCN reaction that required hospitalization: Unk Has patient had a PCN reaction occurring within the last 10 years: No If all of the above answers are "NO", then may proceed with Cephalosporin use.   Jardiance [empagliflozin] Diarrhea        Medication List     STOP taking these medications    acetaminophen 500 MG tablet Commonly known as: TYLENOL   amLODipine  10 MG tablet Commonly known as: NORVASC   psyllium 28 % packet Commonly known as: METAMUCIL SMOOTH TEXTURE       TAKE these medications    aspirin EC 81 MG tablet Take 81 mg by mouth in the morning. Swallow whole.   atorvastatin 80 MG  tablet Commonly known as: LIPITOR Take 80 mg by mouth at bedtime.   carvedilol 25 MG tablet Commonly known as: COREG Take 1 tablet (25 mg total) by mouth 2 (two) times daily with a meal.   Cosopt 2-0.5 % ophthalmic solution Generic drug: dorzolamide-timolol Place 1 drop into the left eye 2 (two) times daily.   furosemide 20 MG tablet Commonly known as: LASIX TAKE 1 TABLET BY MOUTH DAILY   glimepiride 1 MG tablet Commonly known as: AMARYL Take 1 mg by mouth daily with breakfast.   isosorbide mononitrate 30 MG 24 hr tablet Commonly known as: IMDUR Take 0.5 tablets (15 mg total) by mouth daily.   latanoprost 0.005 % ophthalmic solution Commonly known as: XALATAN SMARTSIG:In Eye(s)   lisinopril 40 MG tablet Commonly known as: ZESTRIL Take 40 mg by mouth daily.   Lubricant Eye Drops 0.4-0.3 % Soln Generic drug: Polyethyl Glycol-Propyl Glycol Place 1-2 drops into both eyes 3 (three) times daily as needed (dry/irritated eyes).   metFORMIN 1000 MG tablet Commonly known as: GLUCOPHAGE Take 1,000 mg by mouth 2 (two) times daily with a meal.   nitroGLYCERIN 0.4 MG SL tablet Commonly known as: NITROSTAT Place 0.4 mg under the tongue every 5 (five) minutes x 3 doses as needed for chest pain.   potassium chloride SA 20 MEQ tablet Commonly known as: Klor-Con M20 Take 2 tablets (40 mEq total) by mouth daily.   tamsulosin 0.4 MG Caps capsule Commonly known as: FLOMAX Take 0.4 mg by mouth 2 (two) times daily.   Trulicity [id number] Sopn Generic drug: Dulaglutide Inject into the skin.        Discharge Exam: Filed Weights   03/31/22 1538  Weight: 89.4 kg   HEENT:  [place]/AT, No thrush, no icterus CV:  RRR, no rub, no S3, no S4 Lung:  CTA, no wheeze, no rhonchi Abd:  soft/+BS, NT Ext:  No edema, no lymphangitis, no synovitis, no rash   Condition at discharge: stable  The results of significant diagnostics from this hospitalization (including imaging, microbiology,  ancillary and laboratory) are listed below for reference.   Imaging Studies: NM Myocar Multi W/Spect W/Wall Motion / EF  Result Date: 04/01/2022   The study is normal. There are no perfusion defects  The study is low risk.   No ST deviation was noted.   LV perfusion is normal.   Left ventricular function is normal. Nuclear stress EF: 67 %. The left ventricular ejection fraction is hyperdynamic (>65%). End diastolic cavity size is normal.   CT ANGIO CHEST AORTA W/CM & OR WO/CM  Result Date: 04/01/2022 CLINICAL DATA:  Chest pain. EXAM: CT ANGIOGRAPHY CHEST WITH CONTRAST TECHNIQUE: Multidetector CT imaging of the chest was performed using the standard protocol during bolus administration of intravenous contrast. Multiplanar CT image reconstructions and MIPs were obtained to evaluate the vascular anatomy. RADIATION DOSE REDUCTION: This exam was performed according to the departmental dose-optimization program which includes automated exposure control, adjustment of the mA and/or kV according to patient size and/or use of iterative reconstruction technique. CONTRAST:  21m OMNIPAQUE IOHEXOL 350 MG/ML SOLN COMPARISON:  None Available. FINDINGS: Cardiovascular: Satisfactory opacification of the pulmonary arteries to the segmental level.  No evidence of pulmonary embolism. Normal heart size. No pericardial effusion. Atherosclerosis of thoracic aorta is noted without aneurysm or dissection. Status post coronary artery bypass graft. Mediastinum/Nodes: No enlarged mediastinal, hilar, or axillary lymph nodes. Thyroid gland, trachea, and esophagus demonstrate no significant findings. Lungs/Pleura: Lungs are clear. No pleural effusion or pneumothorax. Upper Abdomen: No acute abnormality. Musculoskeletal: No chest wall abnormality. No acute or significant osseous findings. Review of the MIP images confirms the above findings. IMPRESSION: No definite evidence of pulmonary embolus. No acute pulmonary disease is noted. Status  post coronary artery bypass graft. Aortic Atherosclerosis (ICD10-I70.0). Electronically Signed   By: Marijo Conception M.D.   On: 04/01/2022 10:10   ECHOCARDIOGRAM COMPLETE  Result Date: 04/01/2022    ECHOCARDIOGRAM REPORT   Patient Name:   EZELLE DELASHMUTT Date of Exam: 03/31/2022 Medical Rec #:  [id number]         Height:       69.0 in Accession #:    [id number]        Weight:       197.1 lb Date of Birth:  1940-08-31          BSA:          2.053 m Patient Age:    36 years          BP:           137/79 mmHg Patient Gender: M                 HR:           73 bpm. Exam Location:  [facility] Procedure: 2D Echo, Cardiac Doppler and Color Doppler Indications:     R07.9 Chest Pain  History:         Patient has prior history of Echocardiogram examinations, most                  recent 08/26/2021. CAD, Aortic Valve Disease; Risk Factors:Former                  Smoker, Dyslipidemia, Diabetes and Hypertension. NSTEMI.  Sonographer:     Alvino Chapel RCS Referring Phys:  [id number] [place] Diagnosing Phys: Carlyle Dolly MD IMPRESSIONS  1. Left ventricular ejection fraction, by estimation, is 60 to 65%. The left ventricle has normal function. The left ventricle has no regional wall motion abnormalities. There is mild left ventricular hypertrophy. Left ventricular diastolic parameters are indeterminate.  2. Right ventricular systolic function is normal. The right ventricular size is normal. Tricuspid regurgitation signal is inadequate for assessing PA pressure.  3. The mitral valve is normal in structure. No evidence of mitral valve regurgitation. No evidence of mitral stenosis. Moderate mitral annular calcification.  4. The aortic valve is tricuspid. There is moderate calcification of the aortic valve. There is moderate thickening of the aortic valve. Aortic valve regurgitation is not visualized. Mild to moderate aortic valve stenosis. Aortic valve mean gradient measures 16.5 mmHg. Aortic valve peak gradient measures  28.7 mmHg. Aortic valve area, by VTI measures 2.80 cm.  5. The inferior vena cava is normal in size with greater than 50% respiratory variability, suggesting right atrial pressure of 3 mmHg. FINDINGS  Left Ventricle: Left ventricular ejection fraction, by estimation, is 60 to 65%. The left ventricle has normal function. The left ventricle has no regional wall motion abnormalities. The left ventricular internal cavity size was normal in size. There is  mild left ventricular hypertrophy. Left ventricular diastolic parameters  are indeterminate. Right Ventricle: The right ventricular size is normal. Right vetricular wall thickness was not well visualized. Right ventricular systolic function is normal. Tricuspid regurgitation signal is inadequate for assessing PA pressure. Left Atrium: Left atrial size was normal in size. Right Atrium: Right atrial size was normal in size. Pericardium: There is no evidence of pericardial effusion. Mitral Valve: The mitral valve is normal in structure. There is moderate thickening of the mitral valve leaflet(s). There is moderate calcification of the mitral valve leaflet(s). Moderate mitral annular calcification. No evidence of mitral valve regurgitation. No evidence of mitral valve stenosis. MV peak gradient, 5.5 mmHg. The mean mitral valve gradient is 2.0 mmHg. Tricuspid Valve: The tricuspid valve is normal in structure. Tricuspid valve regurgitation is trivial. No evidence of tricuspid stenosis. Aortic Valve: The aortic valve is tricuspid. There is moderate calcification of the aortic valve. There is moderate thickening of the aortic valve. There is moderate aortic valve annular calcification. Aortic valve regurgitation is not visualized. Mild aortic stenosis is present. Aortic valve mean gradient measures 16.5 mmHg. Aortic valve peak gradient measures 28.7 mmHg. Aortic valve area, by VTI measures 2.80 cm. Pulmonic Valve: The pulmonic valve was not well visualized. Pulmonic valve  regurgitation is not visualized. No evidence of pulmonic stenosis. Aorta: The aortic root is normal in size and structure. Venous: The inferior vena cava is normal in size with greater than 50% respiratory variability, suggesting right atrial pressure of 3 mmHg. IAS/Shunts: No atrial level shunt detected by color flow Doppler.  LEFT VENTRICLE PLAX 2D LVIDd:         5.00 cm   Diastology LVIDs:         3.10 cm   LV e' medial:    5.66 cm/s LV PW:         1.10 cm   LV E/e' medial:  20.1 LV IVS:        1.10 cm   LV e' lateral:   11.50 cm/s LVOT diam:     2.00 cm   LV E/e' lateral: 9.9 LV SV:         146 LV SV Index:   71 LVOT Area:     3.14 cm  RIGHT VENTRICLE RV S prime:     8.92 cm/s TAPSE (M-mode): 1.8 cm LEFT ATRIUM             Index        RIGHT ATRIUM           Index LA diam:        4.30 cm 2.09 cm/m   RA Area:     16.50 cm LA Vol (A2C):   52.3 ml 25.47 ml/m  RA Volume:   45.30 ml  22.06 ml/m LA Vol (A4C):   84.1 ml 40.96 ml/m LA Biplane Vol: 69.1 ml 33.66 ml/m  AORTIC VALVE AV Area (Vmax):    2.64 cm AV Area (Vmean):   2.54 cm AV Area (VTI):     2.80 cm AV Vmax:           268.00 cm/s AV Vmean:          187.500 cm/s AV VTI:            0.521 m AV Peak Grad:      28.7 mmHg AV Mean Grad:      16.5 mmHg LVOT Vmax:         225.00 cm/s LVOT Vmean:        151.667 cm/s LVOT  VTI:          0.465 m LVOT/AV VTI ratio: 0.89  AORTA Ao Root diam: 3.40 cm MITRAL VALVE MV Area (PHT): 2.34 cm     SHUNTS MV Area VTI:   3.93 cm     Systemic VTI:  0.46 m MV Peak grad:  5.5 mmHg     Systemic Diam: 2.00 cm MV Mean grad:  2.0 mmHg MV Vmax:       1.17 m/s MV Vmean:      62.8 cm/s MV Decel Time: 324 msec MV E velocity: 114.00 cm/s MV A velocity: 119.00 cm/s MV E/A ratio:  0.96 Carlyle Dolly MD Electronically signed by Carlyle Dolly MD Signature Date/Time: 03/31/2022/4:44:30 PM    Final (Updated)    DG Chest Portable 1 View  Result Date: 03/31/2022 CLINICAL DATA:  Chest pain EXAM: PORTABLE CHEST 1 VIEW COMPARISON:   02/26/2020 FINDINGS: Normal heart size status post sternotomy and CABG. Aortic atherosclerosis. Slightly low lung volumes. No focal airspace consolidation, pleural effusion, or pneumothorax. IMPRESSION: No active disease. Electronically Signed   By: Davina Poke D.O.   On: 03/31/2022 08:21    Microbiology: Results for orders placed or performed during the hospital encounter of 01/20/20  Resp Panel by RT-PCR (Flu A&B, Covid) Nasopharyngeal Swab     Status: None   Collection Time: 01/20/20 10:02 PM   Specimen: Nasopharyngeal Swab; Nasopharyngeal(NP) swabs in vial transport medium  Result Value Ref Range Status   SARS Coronavirus 2 by RT PCR NEGATIVE NEGATIVE Final    Comment: (NOTE) SARS-CoV-2 target nucleic acids are NOT DETECTED.  The SARS-CoV-2 RNA is generally detectable in upper respiratory specimens during the acute phase of infection. The lowest concentration of SARS-CoV-2 viral copies this assay can detect is 138 copies/mL. A negative result does not preclude SARS-Cov-2 infection and should not be used as the sole basis for treatment or other patient management decisions. A negative result may occur with  improper specimen collection/handling, submission of specimen other than nasopharyngeal swab, presence of viral mutation(s) within the areas targeted by this assay, and inadequate number of viral copies(<138 copies/mL). A negative result must be combined with clinical observations, patient history, and epidemiological information. The expected result is Negative.  Fact Sheet for Patients:  EntrepreneurPulse.com.au  Fact Sheet for Healthcare Providers:  IncredibleEmployment.be  This test is no t yet approved or cleared by the [place] FDA and  has been authorized for detection and/or diagnosis of SARS-CoV-2 by FDA under an Emergency Use Authorization (EUA). This EUA will remain  in effect (meaning this test can be used) for the  duration of the COVID-19 declaration under Section 564(b)(1) of the Act, 21 U.S.C.section 360bbb-3(b)(1), unless the authorization is terminated  or revoked sooner.       Influenza A by PCR NEGATIVE NEGATIVE Final   Influenza B by PCR NEGATIVE NEGATIVE Final    Comment: (NOTE) The Xpert Xpress SARS-CoV-2/FLU/RSV plus assay is intended as an aid in the diagnosis of influenza from Nasopharyngeal swab specimens and should not be used as a sole basis for treatment. Nasal washings and aspirates are unacceptable for Xpert Xpress SARS-CoV-2/FLU/RSV testing.  Fact Sheet for Patients: EntrepreneurPulse.com.au  Fact Sheet for Healthcare Providers: IncredibleEmployment.be  This test is not yet approved or cleared by the [place] FDA and has been authorized for detection and/or diagnosis of SARS-CoV-2 by FDA under an Emergency Use Authorization (EUA). This EUA will remain in effect (meaning this test can be used) for the duration  of the COVID-19 declaration under Section 564(b)(1) of the Act, 21 U.S.C. section 360bbb-3(b)(1), unless the authorization is terminated or revoked.  Performed at [facility], [address], [place], [place] [address]   Surgical pcr screen     Status: None   Collection Time: 01/23/20  6:30 PM   Specimen: Nasal Mucosa; Nasal Swab  Result Value Ref Range Status   MRSA, PCR NEGATIVE NEGATIVE Final   Staphylococcus aureus NEGATIVE NEGATIVE Final    Comment: (NOTE) The Xpert SA Assay (FDA approved for NASAL specimens in patients 97 years of age and older), is one component of a comprehensive surveillance program. It is not intended to diagnose infection nor to guide or monitor treatment. Performed at [facility], [place] [address], [place], [place] [address]     Labs: CBC: Recent Labs  Lab 03/31/22 0824  WBC 9.0  NEUTROABS 5.8  HGB 14.4  HCT 43.0  MCV 96.0  PLT 123XX123   Basic Metabolic Panel: Recent Labs   Lab 03/31/22 0824 04/01/22 0448  NA 139 137  K 3.5 3.6  CL 107 99  CO2 24 26  GLUCOSE 151* 212*  BUN 11 14  CREATININE 0.72 0.81  CALCIUM 8.4* 8.4*  MG 1.3*  --    Liver Function Tests: Recent Labs  Lab 03/31/22 0824  AST 22  ALT 23  ALKPHOS 39  BILITOT 1.3*  PROT 6.8  ALBUMIN 3.7   CBG: Recent Labs  Lab 03/31/22 0824 03/31/22 2142 04/01/22 0802 04/01/22 1129  GLUCAP 154* 170* 229* 240*    Discharge time spent: greater than 30 minutes.  Signed: Orson Eva, MD [facility] 04/01/2022

## 2022-04-01 NOTE — Progress Notes (Signed)
Nsg Discharge Note  Admit Date:  03/31/2022 Discharge date: 04/01/2022   Sean Villarreal to be D/C'd Home per MD order.  AVS completed.   Patient/caregiver able to verbalize understanding.  Discharge Medication: Allergies as of 04/01/2022       Reactions   Contrast Media [iodinated Contrast Media] Hives, Other (See Comments)   Had "welts" on arm, and kidney issues after given dye in the past   Penicillins Other (See Comments)   Passed out as a child Has patient had a PCN reaction causing immediate rash, facial/tongue/throat swelling, SOB or lightheadedness with hypotension: Unk Has patient had a PCN reaction causing severe rash involving mucus membranes or skin necrosis: Unk Has patient had a PCN reaction that required hospitalization: Unk Has patient had a PCN reaction occurring within the last 10 years: No If all of the above answers are "NO", then may proceed with Cephalosporin use.   Jardiance [empagliflozin] Diarrhea        Medication List     STOP taking these medications    acetaminophen 500 MG tablet Commonly known as: TYLENOL   amLODipine 10 MG tablet Commonly known as: NORVASC   psyllium 28 % packet Commonly known as: METAMUCIL SMOOTH TEXTURE       TAKE these medications    aspirin EC 81 MG tablet Take 81 mg by mouth in the morning. Swallow whole.   atorvastatin 80 MG tablet Commonly known as: LIPITOR Take 80 mg by mouth at bedtime.   carvedilol 25 MG tablet Commonly known as: COREG Take 1 tablet (25 mg total) by mouth 2 (two) times daily with a meal.   Cosopt 2-0.5 % ophthalmic solution Generic drug: dorzolamide-timolol Place 1 drop into the left eye 2 (two) times daily.   furosemide 20 MG tablet Commonly known as: LASIX TAKE 1 TABLET BY MOUTH DAILY   glimepiride 1 MG tablet Commonly known as: AMARYL Take 1 mg by mouth daily with breakfast.   isosorbide mononitrate 30 MG 24 hr tablet Commonly known as: IMDUR Take 0.5 tablets (15 mg total)  by mouth daily.   latanoprost 0.005 % ophthalmic solution Commonly known as: XALATAN SMARTSIG:In Eye(s)   lisinopril 40 MG tablet Commonly known as: ZESTRIL Take 40 mg by mouth daily.   Lubricant Eye Drops 0.4-0.3 % Soln Generic drug: Polyethyl Glycol-Propyl Glycol Place 1-2 drops into both eyes 3 (three) times daily as needed (dry/irritated eyes).   metFORMIN 1000 MG tablet Commonly known as: GLUCOPHAGE Take 1,000 mg by mouth 2 (two) times daily with a meal.   nitroGLYCERIN 0.4 MG SL tablet Commonly known as: NITROSTAT Place 0.4 mg under the tongue every 5 (five) minutes x 3 doses as needed for chest pain.   potassium chloride SA 20 MEQ tablet Commonly known as: Klor-Con M20 Take 2 tablets (40 mEq total) by mouth daily.   tamsulosin 0.4 MG Caps capsule Commonly known as: FLOMAX Take 0.4 mg by mouth 2 (two) times daily.   Trulicity [id number] 0000000 Sopn Generic drug: Dulaglutide Inject into the skin.        Discharge Assessment: Vitals:   04/01/22 0021 04/01/22 0513  BP: 111/74 117/79  Pulse: 92 95  Resp:  19  Temp: 98.1 F (36.7 C) 98.2 F (36.8 C)  SpO2: 96% 96%   Skin clean, dry and intact without evidence of skin break down, no evidence of skin tears noted. IV catheter discontinued intact. Site without signs and symptoms of complications - no redness or edema noted at insertion  site, patient denies c/o pain - only slight tenderness at site.  Dressing with slight pressure applied.  D/c Instructions-Education: Discharge instructions given to patient/family with verbalized understanding. D/c education completed with patient/family including follow up instructions, medication list, d/c activities limitations if indicated, with other d/c instructions as indicated by MD - patient able to verbalize understanding, all questions fully answered. Patient instructed to return to ED, call 911, or call MD for any changes in condition.  Patient escorted via [place], and D/C home  via private auto.  Kathie Rhodes, RN 04/01/2022 3:44 PM

## 2022-04-01 NOTE — Plan of Care (Signed)

## 2022-04-01 NOTE — Consult Note (Addendum)
Cardiology Consult    Patient ID: [name] MRN: [id number], DOB/AGE: 81/09/1940   Admit date: 03/31/2022 Date of Consult: 04/01/2022  Primary Physician: Denny Levy, [place] Primary Cardiologist: Rozann Lesches, MD Requesting Provider: D. Tat, MD  Patient Profile    Sean Villarreal is a 82 y.o. male with a history of CAD status post multiple percutaneous interventions and subsequent CABG x 2 in December 2021, postoperative atrial fibrillation, aortic stenosis, hypertension, hyperlipidemia, diabetes, and GERD, who is being seen today for the evaluation of chest pain at the request of Dr. Carles Collet.  Past Medical History   Past Medical History:  Diagnosis Date   Aortic stenosis    a. 03/2022 Echo: EF 60-65%, no rwma, mild LVH, nl RV fxn, mild-mod AS.   Arthritis    Basal cell carcinoma    Coronary artery disease    a. DES to LAD and LCx April 2014 b. PTCA ostial circumflex 03/2016 due to ISR c.  9/18 PCI/DESx2 osital Lcx (ISR), p/mLCx  d. s/p CABG on 01/24/2020 with LIMA-LAD and SVG-OM.   Essential hypertension    GERD (gastroesophageal reflux disease)    Hematuria    History of blood transfusion 09/2012   History of kidney stones    Hyperlipidemia LDL goal <70    NSTEMI (non-ST elevated myocardial infarction) ([place]) 08/2012   Post-op Afib ([place])    a. 12/2019-01/2020 following CABG. Briefly on amio.   Type II diabetes mellitus ([place])     Past Surgical History:  Procedure Laterality Date   BASAL CELL CARCINOMA EXCISION     "right cheek; both shoulders"   CARDIAC CATHETERIZATION     CATARACT EXTRACTION W/ INTRAOCULAR LENS  IMPLANT, BILATERAL Bilateral    COLONOSCOPY N/A 03/19/2016   Procedure: COLONOSCOPY;  Surgeon: Rogene Houston, MD;  [facility];  Service: Endoscopy;  Laterality: N/A;  9:15   CORONARY ARTERY BYPASS GRAFT N/A 01/24/2020   Procedure: CORONARY ARTERY BYPASS GRAFTING (CABG), ON PUMP, TIMES TWO, USING LEFT INTERNAL MAMMARY ARTERY AND ENDOSCOPICALLY  HARVESTED RIGHT GREATER SAPHENOUS VEIN;  Surgeon: Melrose Nakayama, MD;  Location: [facility];  Service: Open Heart Surgery;  Laterality: N/A;   CORONARY BALLOON ANGIOPLASTY N/A 04/15/2016   Procedure: Coronary Balloon Angioplasty;  Surgeon: Peter M Martinique, MD;  Location: [facility];  Service: Cardiovascular;  Laterality: N/A;   CORONARY STENT INTERVENTION N/A 10/11/2016   Procedure: CORONARY STENT INTERVENTION;  Surgeon: Jettie Booze, MD;  Location: [facility];  Service: Cardiovascular;  Laterality: N/A;   CYSTOSCOPY W/ URETEROSCOPY W/ LITHOTRIPSY  10/2012   /notes 11/10/2012   CYSTOSCOPY WITH STENT PLACEMENT  08/2012; 09/2012   EYE SURGERY Left ~ 02/2016   "for crinkled lens"   INGUINAL HERNIA REPAIR Left 12/24/2020   Procedure: HERNIA REPAIR INGUINAL WITH MESH;  Surgeon: Virl Cagey, MD;  Location: [facility];  Service: General;  Laterality: Left;   LEFT HEART CATH AND CORONARY ANGIOGRAPHY N/A 04/15/2016   Procedure: Left Heart Cath and Coronary Angiography;  Surgeon: Peter M Martinique, MD;  Location: [facility];  Service: Cardiovascular;  Laterality: N/A;   LEFT HEART CATH AND CORONARY ANGIOGRAPHY N/A 10/11/2016   Procedure: LEFT HEART CATH AND CORONARY ANGIOGRAPHY;  Surgeon: Jettie Booze, MD;  Location: [facility];  Service: Cardiovascular;  Laterality: N/A;   LEFT HEART CATH AND CORONARY ANGIOGRAPHY N/A 03/31/2018   Procedure: LEFT HEART CATH AND CORONARY ANGIOGRAPHY;  Surgeon: Troy Sine, MD;  Location: [facility];  Service: Cardiovascular;  Laterality: N/A;   LEFT HEART CATH AND CORONARY ANGIOGRAPHY N/A 01/21/2020   Procedure: LEFT HEART CATH AND CORONARY ANGIOGRAPHY;  Surgeon: Belva Crome, MD;  Location: [facility];  Service: Cardiovascular;  Laterality: N/A;   LEFT HEART CATHETERIZATION WITH CORONARY ANGIOGRAM N/A 05/05/2012   Procedure: LEFT HEART CATHETERIZATION WITH CORONARY ANGIOGRAM;  Surgeon: Burnell Blanks,  MD;  Location: [facility] CATH LAB;  Service: Cardiovascular;  Laterality: N/A;   LEFT HEART CATHETERIZATION WITH CORONARY ANGIOGRAM N/A 05/15/2012   Procedure: LEFT HEART CATHETERIZATION WITH CORONARY ANGIOGRAM;  Surgeon: Burnell Blanks, MD;  Location: [facility] CATH LAB;  Service: Cardiovascular;  Laterality: N/A;   TEE WITHOUT CARDIOVERSION N/A 01/24/2020   Procedure: TRANSESOPHAGEAL ECHOCARDIOGRAM (TEE);  Surgeon: Melrose Nakayama, MD;  Location: [facility];  Service: Open Heart Surgery;  Laterality: N/A;   TONSILLECTOMY  1948     Allergies  Allergies  Allergen Reactions   Contrast Media [Iodinated Contrast Media] Hives and Other (See Comments)    Had "welts" on arm, and kidney issues after given dye in the past   Penicillins Other (See Comments)    Passed out as a child Has patient had a PCN reaction causing immediate rash, facial/tongue/throat swelling, SOB or lightheadedness with hypotension: Unk Has patient had a PCN reaction causing severe rash involving mucus membranes or skin necrosis: Unk Has patient had a PCN reaction that required hospitalization: Unk Has patient had a PCN reaction occurring within the last 10 years: No If all of the above answers are "NO", then may proceed with Cephalosporin use.    Jardiance [Empagliflozin] Diarrhea    History of Present Illness    82 year old male with above past medical history including CAD, postoperative atrial fibrillation, aortic stenosis, hypertension, hyperlipidemia, diabetes, and GERD.  He previous underwent drug-eluting stent placement to the LAD and the circumflex in April 2014 followed by PTCA of the ostial circumflex due to in-stent restenosis in March 2018.  He had recurrent in-stent restenosis in September 2018 requiring drug-eluting stent placement to the proximal and mid left circumflex.  Finally, in December 2021, he underwent CABG x 2 with placement of a LIMA to the LAD and vein graft to the obtuse marginal.  He did experience  postoperative atrial fibrillation and was managed with a brief course of amiodarone.  Follow-up echo in August 2023 showed normal LV function with moderate aortic stenosis.    Mr. Thompkins was doing well at his September 2023 office visit and since then, has continued to walk 1 mile a day without symptoms or limitations.  Approximate 1 week ago, he noted a knot like discomfort in the center of his chest upon awakening in the morning.  He had to drive to [place] and noted that symptoms persisted throughout the drive but resolved within about an hour.  There were no associated symptoms.  He otherwise went about his day without any recurrence.  On a few occasions since then, he has noted discomfort between his shoulder blades.  This symptom occurred while watching TV and might have been associated with mild dyspnea, he does not really remember, and was somewhat reminiscent of prior anginal equivalent.  He placed a heating pad between his shoulder blades and within an hour or 2, symptoms resolved.  On the morning of March 6, he awoke and noted intermittent sharp and fleeting discomfort moving across his left chest, without associated symptoms.  Because of the constellation of symptoms  over the past week, he presented to the emergency department.  Here, ECG was unremarkable.  BNP was mildly elevated at BNP 143, and he was given a dose of Lasix. HsTrops normal at 7  9.  Lab work otherwise unremarkable and chest x-ray without acute disease.  He was admitted for further evaluation.  Echo performed yesterday showed an EF of 60 to 65% without regional wall motion abnormalities, mild LVH, and mild to moderate aortic stenosis.  He has had no recurrent chest pain this morning.  On telemetry, he is predominantly in sinus rhythm with some sinus tachycardia after getting up this morning.  He has otherwise been hemodynamically stable.  Inpatient Medications     aspirin EC  81 mg Oral Daily   atorvastatin  80 mg Oral QHS    carvedilol  25 mg Oral BID WC   dorzolamide-timolol  1 drop Left Eye BID   enoxaparin (LOVENOX) injection  40 mg Subcutaneous Q24H   furosemide  20 mg Oral Daily   insulin aspart  0-9 Units Subcutaneous TID WC   nitroGLYCERIN  0.4 mg Sublingual Once   potassium chloride SA  40 mEq Oral Daily   tamsulosin  0.4 mg Oral BID    Family History    Family History  Problem Relation Age of Onset   Heart disease Mother    Heart failure Mother    Heart disease Father    Heart attack Father    Colon cancer Neg Hx    He indicated that his mother is deceased. He indicated that his father is deceased. He indicated that his child is alive. He indicated that the status of his neg hx is unknown. He indicated that his other is alive.   Social History    Social History   Socioeconomic History   Marital status: Widowed    Spouse name: Not on file   Number of children: Not on file   Years of education: Not on file   Highest education level: Not on file  Occupational History   Not on file  Tobacco Use   Smoking status: Former    Packs/day: 0.75    Years: 30.00    Total pack years: 22.50    Types: Cigarettes    Start date: 01/25/1982    Quit date: 11/05/1988    Years since quitting: 33.4   Smokeless tobacco: Never  Vaping Use   Vaping Use: Never used  Substance and Sexual Activity   Alcohol use: Not Currently    Alcohol/week: 0.0 standard drinks of alcohol   Drug use: No   Sexual activity: Not Currently  Other Topics Concern   Not on file  Social History Narrative   Not on file   Social Determinants of Health   Financial Resource Strain: Not on file  Food Insecurity: No Food Insecurity (03/31/2022)   Hunger Vital Sign    Worried About Running Out of Food in the Last Year: Never true    Ran Out of Food in the Last Year: Never true  Transportation Needs: No Transportation Needs (03/31/2022)   PRAPARE - Hydrologist (Medical): No    Lack of Transportation  (Non-Medical): No  Physical Activity: Not on file  Stress: Not on file  Social Connections: Not on file  Intimate Partner Violence: Not At Risk (03/31/2022)   Humiliation, Afraid, Rape, and Kick questionnaire    Fear of Current or Ex-Partner: No    Emotionally Abused: No  Physically Abused: No    Sexually Abused: No     Review of Systems    General:  No chills, fever, night sweats or weight changes.  Cardiovascular:  +++ chest pain, +++ pain between his shoulder blades a few days ago.  No dyspnea on exertion, edema, orthopnea, palpitations, paroxysmal nocturnal dyspnea. Dermatological: No rash, lesions/masses Respiratory: No cough, dyspnea Urologic: No hematuria, dysuria Abdominal:   No nausea, vomiting, diarrhea, bright red blood per rectum, melena, or hematemesis Neurologic:  No visual changes, wkns, changes in mental status. All other systems reviewed and are otherwise negative except as noted above.  Physical Exam    Blood pressure 117/79, pulse 95, temperature 98.2 F (36.8 C), temperature source Oral, resp. rate 19, height '5\' 9"'$  (1.753 m), weight 89.4 kg, SpO2 96 %.  General: Pleasant, NAD Psych: Normal affect. Neuro: Alert and oriented X 3. Moves all extremities spontaneously. HEENT: Normal  Neck: Supple without bruits or JVD. Lungs:  Resp regular and unlabored, CTA. Heart: RRR no s3, s4.  2/6 systolic ejection murmur heard throughout. Abdomen: Soft, non-tender, non-distended, BS + x 4.  Extremities: No clubbing, cyanosis or edema. DP/PT2+, Radials 2+ and equal bilaterally.  Labs    Cardiac Enzymes Recent Labs  Lab 03/31/22 0824 03/31/22 1013  TROPONINIHS 7 8     BNP    Component Value Date/Time   BNP 143.0 (H) 03/31/2022 0824   Lab Results  Component Value Date   WBC 9.0 03/31/2022   HGB 14.4 03/31/2022   HCT 43.0 03/31/2022   MCV 96.0 03/31/2022   PLT 151 03/31/2022    Recent Labs  Lab 03/31/22 0824 04/01/22 0448  NA 139 137  K 3.5 3.6  CL 107  99  CO2 24 26  BUN 11 14  CREATININE 0.72 0.81  CALCIUM 8.4* 8.4*  PROT 6.8  --   BILITOT 1.3*  --   ALKPHOS 39  --   ALT 23  --   AST 22  --   GLUCOSE 151* 212*   Lab Results  Component Value Date   CHOL 132 01/22/2020   HDL 52 01/22/2020   LDLCALC 67 01/22/2020   TRIG 67 01/22/2020   No results found for: "DDIMER"    Radiology Studies    DG Chest Portable 1 View  Result Date: 03/31/2022 CLINICAL DATA:  Chest pain EXAM: PORTABLE CHEST 1 VIEW COMPARISON:  02/26/2020 FINDINGS: Normal heart size status post sternotomy and CABG. Aortic atherosclerosis. Slightly low lung volumes. No focal airspace consolidation, pleural effusion, or pneumothorax. IMPRESSION: No active disease. Electronically Signed   By: Davina Poke D.O.   On: 03/31/2022 08:21    ECG & Cardiac Imaging    Regular sinus rhythm, 93, right bundle Midge Momon block.  No acute ST or T changes- personally reviewed.  Assessment & Plan    1.  Precordial chest pain/CAD: Prior history of multiple PCI's in the setting of in-stent restenosis in the left circumflex followed by CABG x 2 with a LIMA to the LAD and vein graft to the obtuse marginal and December 2021.  He has been followed closely as an outpatient and has generally done well.  He has been walking 1 mile a day without symptoms or limitations.  He had 1 hour of a knot-like chest pain a week ago, which resolved spontaneously, and since then, has had a few occasions of discomfort between his shoulder blades (reminiscent of anginal equivalent), and then sharp, shooting, and fleeting chest pain yesterday morning,  prompting presentation.  Troponins are normal.  ECG without acute changes.  CT of the chest is pending.  He is chest pain-free this morning.  Will plan on Lexiscan Myoview to rule out ischemia provided that this is normal, he should be able to be safely discharged this afternoon.  Continue aspirin, statin, beta-blocker.  2.  Essential hypertension: Stable.  Continue  beta-blocker.  3.  Hyperlipidemia: On statin therapy with an LDL of 67 in December 2021.  Will need outpatient follow-up.  4.  Mild to moderate aortic stenosis: Stable by echo yesterday.  5.  Type 2 diabetes mellitus: On metformin at home.  Per internal medicine.  6.  Sinus tachycardia: Patient more tachycardic this morning.  No distress, clinically nontoxic.  Labs unremarkable.  I note he is getting prednisone 50 mg every 6 hours in preparation for CT of the chest related to contrast allergy, suspect this is playing a role.  Continue beta-blocker.   Risk Assessment/Risk Scores:     TIMI Risk Score for Unstable Angina or Non-ST Elevation MI:   The patient's TIMI risk score is 4, which indicates a 20% risk of all cause mortality, new or recurrent myocardial infarction or need for urgent revascularization in the next 14 days.      Signed, Murray Hodgkins, NP 04/01/2022, 9:00 AM  For questions or updates, please contact   Please consult [email or web address] for contact info under Cardiology/STEMI.    Attending note Patient seen and discussed with NP Sharolyn Douglas, I agree with his documentation. 82 yo male history of CAD with prior interventions as described above, HTN, HL, DM2, aortic stenosis presents with chest pain. Intermittent left sided pain over the last several days.    K 3.5 Cr 0.72 BUN 11 Mg 1.3 BNP 143 WBC 9Hgb 14.4 Plt 151  Trop 7-->8 EKG SR, RBBB, no acute ischemic changes CXR no acute process CTA pending  Echo: LVEF 60-65%, no WMAs, indet diastolic fxn, mild AS mean grad 16.5  1.CAD/chest pain - history of CAD with prior CABG and PCIs as documented above - presents with chest pain, no objective evidence of ACS by EKG, enzymes, echo - given history and symptoms plan for lexiscan today   Carlyle Dolly MD

## 2022-04-01 NOTE — TOC Progression Note (Signed)
  Transition of Care [facility]) Screening Note   Patient Details  Name: Sean Villarreal Date of Birth: 03-30-1940   Transition of [facility]) CM/SW Contact:    Shade Flood, LCSW Phone Number: 04/01/2022, 10:41 AM    Transition of Care Department [facility]) has reviewed patient and no TOC needs have been identified at this time. We will continue to monitor patient advancement through interdisciplinary progression rounds. If new patient transition needs arise, please place a TOC consult.

## 2022-04-01 NOTE — Progress Notes (Addendum)
PROGRESS NOTE  Sean Villarreal [id number] DOB: 1940/12/13 DOA: 03/31/2022 PCP: Denny Levy, PA  Brief History:  82 year old male with a history of coronary artery disease status post CABG December 2021, hypertension, diabetes mellitus type 2, hyperlipidemia presenting with substernal chest pain.  The patient states that he has had some intermittent substernal chest pain that began on 03/27/22.  He stated that initial episode lasted about an hour while he was driving.  Since then, he has had intermittent episodes of sharp substernal chest pain lasting about a minute not necessarily worsening with exertion.  However he has had some dyspnea on exertion in the past week.  Patient states that his chest pain would radiate straight to his back between his shoulder blades.  He has had some intermittent left arm discomfort.  He denies any fevers, chills, headache, cough, hemoptysis, nausea, vomiting, direct abdominal pain.  He took 1 nitroglycerin sublingual on the morning of 03/31/22 with some relief of his chest discomfort.  He has noted some pedal edema but denies any frank orthopnea or increasing abdominal girth. In the ED, the patient was afebrile and hemodynamically stable with oxygen saturation 96% on room air.  WBC 9.0, hemoglobin 14.4, platelets 151,000.  Sodium 139, potassium 3.5, bicarbonate 24, serum creatinine 0.72.  LFTs were unremarkable.  Magnesium 1.3.  Troponin 7>> 8.  BNP was 143.  Chest x-ray was negative for any pulmonary edema or consolidation.  Pt was given lasix 20 mg IV x 1 in ED.  Cardiology was consulted to assist with management.   Assessment/Plan:  Chest pain/ coronary disease -Concerned about angina -Troponins unremarkable -Personally reviewed EKG--sinus, RBBB -CTA chest rule out dissection -Continue aspirin -03/31/22 Echo--EF 60-65%, no WMA, mild/mod AS   Essential hypertension -Continue carvedilol -Holding amlodipine and lisinopril   Diabetes mellitus type  2 -Hemoglobin A1c -NovoLog sliding scale -Holding Trulicity, glipizide, metformin   Mixed hyperlipidemia -Continues statin       Family Communication:  no Family at bedside  Consultants:  cardiology  Code Status:  FULL  DVT Prophylaxis:  [place] Lovenox   Procedures: As Listed in Progress Note Above  Antibiotics: None     Subjective: Patient had short lasting episodes of chest discomfort last night lasting a minute.  He denies any fevers, chills, shortness of breath, nausea, vomiting, diarrhea, abdominal pain.  Objective: Vitals:   03/31/22 1538 03/31/22 2040 04/01/22 0021 04/01/22 0513  BP:  134/71 111/74 117/79  Pulse:  80 92 95  Resp:  15  19  Temp:  98.2 F (36.8 C) 98.1 F (36.7 C) 98.2 F (36.8 C)  TempSrc:  Oral Oral Oral  SpO2:  97% 96% 96%  Weight: 89.4 kg     Height: '5\' 9"'$  (1.753 m)       Intake/Output Summary (Last 24 hours) at 04/01/2022 0703 Last data filed at 04/01/2022 [id number] Gross per 24 hour  Intake --  Output 1100 ml  Net -1100 ml   Weight change:  Exam:  General:  Pt is alert, follows commands appropriately, not in acute distress HEENT: No icterus, No thrush, No neck mass, [place]/AT Cardiovascular: RRR, S1/S2, no rubs, no gallops Respiratory: CTA bilaterally, no wheezing, no crackles, no rhonchi Abdomen: Soft/+BS, non tender, non distended, no guarding Extremities: No edema, No lymphangitis, No petechiae, No rashes, no synovitis   Data Reviewed: I have personally reviewed following labs and imaging studies Basic Metabolic Panel: Recent Labs  Lab 03/31/22 0824 04/01/22  0448  NA 139 137  K 3.5 3.6  CL 107 99  CO2 24 26  GLUCOSE 151* 212*  BUN 11 14  CREATININE 0.72 0.81  CALCIUM 8.4* 8.4*  MG 1.3*  --    Liver Function Tests: Recent Labs  Lab 03/31/22 0824  AST 22  ALT 23  ALKPHOS 39  BILITOT 1.3*  PROT 6.8  ALBUMIN 3.7   No results for input(s): "LIPASE", "AMYLASE" in the last 168 hours. No results for input(s):  "AMMONIA" in the last 168 hours. Coagulation Profile: No results for input(s): "INR", "PROTIME" in the last 168 hours. CBC: Recent Labs  Lab 03/31/22 0824  WBC 9.0  NEUTROABS 5.8  HGB 14.4  HCT 43.0  MCV 96.0  PLT 151   Cardiac Enzymes: No results for input(s): "CKTOTAL", "CKMB", "CKMBINDEX", "TROPONINI" in the last 168 hours. BNP: Invalid input(s): "POCBNP" CBG: Recent Labs  Lab 03/31/22 0824 03/31/22 2142  GLUCAP 154* 170*   HbA1C: No results for input(s): "HGBA1C" in the last 72 hours. Urine analysis:    Component Value Date/Time   COLORURINE YELLOW 12/05/2020 [place] 12/05/2020 1449   LABSPEC 1.026 12/05/2020 1449   PHURINE 5.0 12/05/2020 1449   GLUCOSEU NEGATIVE 12/05/2020 1449   HGBUR SMALL (A) 12/05/2020 1449   BILIRUBINUR NEGATIVE 12/05/2020 1449   KETONESUR 20 (A) 12/05/2020 1449   PROTEINUR NEGATIVE 12/05/2020 1449   NITRITE NEGATIVE 12/05/2020 1449   LEUKOCYTESUR TRACE (A) 12/05/2020 1449   Sepsis Labs: '@LABRCNTIP'$ (procalcitonin:4,lacticidven:4) )No results found for this or any previous visit (from the past 240 hour(s)).   Scheduled Meds:  aspirin EC  81 mg Oral Daily   atorvastatin  80 mg Oral QHS   carvedilol  25 mg Oral BID WC   diphenhydrAMINE  50 mg Oral Once   Or   diphenhydrAMINE  50 mg Intravenous Once   dorzolamide-timolol  1 drop Left Eye BID   enoxaparin (LOVENOX) injection  40 mg Subcutaneous Q24H   furosemide  20 mg Oral Daily   insulin aspart  0-9 Units Subcutaneous TID WC   nitroGLYCERIN  0.4 mg Sublingual Once   potassium chloride SA  40 mEq Oral Daily   predniSONE  50 mg Oral Q6H   tamsulosin  0.4 mg Oral BID   Continuous Infusions:  Procedures/Studies: ECHOCARDIOGRAM COMPLETE  Result Date: 03/31/2022    ECHOCARDIOGRAM REPORT   Patient Name:   Sean Villarreal Date of Exam: 03/31/2022 Medical Rec #:  [id number]         Height:       69.0 in Accession #:    [id number]        Weight:       197.1 lb Date of  Birth:  05/28/1940          BSA:          2.053 m Patient Age:    78 years          BP:           137/79 mmHg Patient Gender: M                 HR:           73 bpm. Exam Location:  [facility] Procedure: 2D Echo, Cardiac Doppler and Color Doppler Indications:    R07.9 Chest Pain  History:        Patient has prior history of Echocardiogram examinations, most  recent 08/26/2021. CAD, Aortic Valve Disease; Risk Factors:Former                 Smoker, Dyslipidemia, Diabetes and Hypertension. NSTEMI.  Sonographer:    Alvino Chapel RCS Referring Phys: [id number] [place]  1. Left ventricular ejection fraction, by estimation, is 60 to 65%. The left ventricle has normal function. The left ventricle has no regional wall motion abnormalities. There is mild left ventricular hypertrophy. Left ventricular diastolic parameters are indeterminate.  2. Right ventricular systolic function is normal. The right ventricular size is normal. Tricuspid regurgitation signal is inadequate for assessing PA pressure.  3. The mitral valve is normal in structure. No evidence of mitral valve regurgitation. No evidence of mitral stenosis. Moderate mitral annular calcification.  4. The aortic valve is tricuspid. There is moderate calcification of the aortic valve. There is moderate thickening of the aortic valve. Aortic valve regurgitation is not visualized. Mild to moderate aortic valve stenosis. Aortic valve mean gradient measures 16.5 mmHg. Aortic valve peak gradient measures 28.7 mmHg. Aortic valve area, by VTI measures 2.80 cm.  5. The inferior vena cava is normal in size with greater than 50% respiratory variability, suggesting right atrial pressure of 3 mmHg. FINDINGS  Left Ventricle: Left ventricular ejection fraction, by estimation, is 60 to 65%. The left ventricle has normal function. The left ventricle has no regional wall motion abnormalities. The left ventricular internal cavity size was normal in size.  There is  mild left ventricular hypertrophy. Left ventricular diastolic parameters are indeterminate. Right Ventricle: The right ventricular size is normal. Right vetricular wall thickness was not well visualized. Right ventricular systolic function is normal. Tricuspid regurgitation signal is inadequate for assessing PA pressure. Left Atrium: Left atrial size was normal in size. Right Atrium: Right atrial size was normal in size. Pericardium: There is no evidence of pericardial effusion. Mitral Valve: The mitral valve is normal in structure. There is moderate thickening of the mitral valve leaflet(s). There is moderate calcification of the mitral valve leaflet(s). Moderate mitral annular calcification. No evidence of mitral valve regurgitation. No evidence of mitral valve stenosis. MV peak gradient, 5.5 mmHg. The mean mitral valve gradient is 2.0 mmHg. Tricuspid Valve: The tricuspid valve is normal in structure. Tricuspid valve regurgitation is trivial. No evidence of tricuspid stenosis. Aortic Valve: The aortic valve is tricuspid. There is moderate calcification of the aortic valve. There is moderate thickening of the aortic valve. There is moderate aortic valve annular calcification. Aortic valve regurgitation is not visualized. Mild to moderate aortic stenosis is present. Aortic valve mean gradient measures 16.5 mmHg. Aortic valve peak gradient measures 28.7 mmHg. Aortic valve area, by VTI measures 2.80 cm. Pulmonic Valve: The pulmonic valve was not well visualized. Pulmonic valve regurgitation is not visualized. No evidence of pulmonic stenosis. Aorta: The aortic root is normal in size and structure. Venous: The inferior vena cava is normal in size with greater than 50% respiratory variability, suggesting right atrial pressure of 3 mmHg. IAS/Shunts: No atrial level shunt detected by color flow Doppler.  LEFT VENTRICLE PLAX 2D LVIDd:         5.00 cm   Diastology LVIDs:         3.10 cm   LV e' medial:    5.66  cm/s LV PW:         1.10 cm   LV E/e' medial:  20.1 LV IVS:        1.10 cm   LV e' lateral:  11.50 cm/s LVOT diam:     2.00 cm   LV E/e' lateral: 9.9 LV SV:         146 LV SV Index:   71 LVOT Area:     3.14 cm  RIGHT VENTRICLE RV S prime:     8.92 cm/s TAPSE (M-mode): 1.8 cm LEFT ATRIUM             Index        RIGHT ATRIUM           Index LA diam:        4.30 cm 2.09 cm/m   RA Area:     16.50 cm LA Vol (A2C):   52.3 ml 25.47 ml/m  RA Volume:   45.30 ml  22.06 ml/m LA Vol (A4C):   84.1 ml 40.96 ml/m LA Biplane Vol: 69.1 ml 33.66 ml/m  AORTIC VALVE AV Area (Vmax):    2.64 cm AV Area (Vmean):   2.54 cm AV Area (VTI):     2.80 cm AV Vmax:           268.00 cm/s AV Vmean:          187.500 cm/s AV VTI:            0.521 m AV Peak Grad:      28.7 mmHg AV Mean Grad:      16.5 mmHg LVOT Vmax:         225.00 cm/s LVOT Vmean:        151.667 cm/s LVOT VTI:          0.465 m LVOT/AV VTI ratio: 0.89  AORTA Ao Root diam: 3.40 cm MITRAL VALVE MV Area (PHT): 2.34 cm     SHUNTS MV Area VTI:   3.93 cm     Systemic VTI:  0.46 m MV Peak grad:  5.5 mmHg     Systemic Diam: 2.00 cm MV Mean grad:  2.0 mmHg MV Vmax:       1.17 m/s MV Vmean:      62.8 cm/s MV Decel Time: 324 msec MV E velocity: 114.00 cm/s MV A velocity: 119.00 cm/s MV E/A ratio:  0.96 Carlyle Dolly MD Electronically signed by Carlyle Dolly MD Signature Date/Time: 03/31/2022/4:44:30 PM    Final    DG Chest Portable 1 View  Result Date: 03/31/2022 CLINICAL DATA:  Chest pain EXAM: PORTABLE CHEST 1 VIEW COMPARISON:  02/26/2020 FINDINGS: Normal heart size status post sternotomy and CABG. Aortic atherosclerosis. Slightly low lung volumes. No focal airspace consolidation, pleural effusion, or pneumothorax. IMPRESSION: No active disease. Electronically Signed   By: Davina Poke D.O.   On: 03/31/2022 08:21    Orson Eva, DO  [facility]  If 7PM-7AM, please contact night-coverage [email or web address] Password [email or web address] 04/01/2022, 7:03 AM   LOS: 0 days

## 2022-04-01 NOTE — Care Management Obs Status (Signed)
[facility] NOTIFICATION   Patient Details  Name: Sean Villarreal MRN: [id number] Date of Birth: 09/26/1940   Medicare Observation Status Notification Given:  Yes    Tommy Medal 04/01/2022, 3:54 PM

## 2022-04-08 DIAGNOSIS — R0789 Other chest pain: Secondary | ICD-10-CM | POA: Diagnosis not present

## 2022-04-08 DIAGNOSIS — E1165 Type 2 diabetes mellitus with hyperglycemia: Secondary | ICD-10-CM | POA: Diagnosis not present

## 2022-04-08 DIAGNOSIS — Z6830 Body mass index (BMI) 30.0-30.9, adult: Secondary | ICD-10-CM | POA: Diagnosis not present

## 2022-04-10 NOTE — Progress Notes (Unsigned)
Cardiology Office Note  Date: 04/12/2022   ID: KYJUAN STOLLEY, DOB 17-Aug-1940, MRN [id number]  History of Present Illness: Sean Villarreal is an 82 y.o. male last seen in September 2023.  I reviewed interval records, he was hospitalized recently for evaluation of chest discomfort, ruled out for ACS.  He was seen in consultation by Dr. Harl Bowie and follow-up Myoview was ultimately low risk with no significant ischemia and LVEF 60 to 65% by echocardiography.  Medications were adjusted, he was taken off Norvasc and started on Imdur.  Since discharge he states that he has done reasonably well.  No recurring symptoms of similar intensity.  He is tolerating Imdur which we discussed increasing to 30 mg daily for now.  He saw his PCP late last week and had follow-up lab work which is reviewed below.  Physical Exam: VS:  BP 134/78   Pulse 87   Ht 5\' 9"  (1.753 m)   Wt 200 lb 3.2 oz (90.8 kg)   SpO2 99%   BMI 29.56 kg/m , BMI Body mass index is 29.56 kg/m.  Wt Readings from Last 3 Encounters:  04/12/22 200 lb 3.2 oz (90.8 kg)  03/31/22 197 lb 1.5 oz (89.4 kg)  10/12/21 203 lb (92.1 kg)    General: Patient appears comfortable at rest. HEENT: Conjunctiva and lids normal. Neck: Supple, no elevated JVP or carotid bruits. Lungs: Clear to auscultation, nonlabored breathing at rest. Cardiac: Regular rate and rhythm, no S3, 3/6 systolic murmur. Extremities: No pitting edema..  ECG:  An ECG dated 03/31/2022 was personally reviewed today and demonstrated:  Sinus rhythm with right bundle rash block and nonspecific T wave changes.  Labwork: December 2022: Cholesterol 116, triglycerides 56, HDL 49, LDL 54 03/31/2022: ALT 23; AST 22; B Natriuretic Peptide 143.0; Hemoglobin 14.4; Magnesium 1.3; Platelets 151 04/01/2022: BUN 14; Creatinine, Ser 0.81; Potassium 3.6; Sodium 137  04/08/2022: Hemoglobin 14.0, platelets 179, BUN 17, creatinine 0.9, potassium 4.1, AST 18, ALT 24, magnesium 1.3  Other Studies  Reviewed Today:  Echocardiogram 03/31/2022:  1. Left ventricular ejection fraction, by estimation, is 60 to 65%. The  left ventricle has normal function. The left ventricle has no regional  wall motion abnormalities. There is mild left ventricular hypertrophy.  Left ventricular diastolic parameters  are indeterminate.   2. Right ventricular systolic function is normal. The right ventricular  size is normal. Tricuspid regurgitation signal is inadequate for assessing  PA pressure.   3. The mitral valve is normal in structure. No evidence of mitral valve  regurgitation. No evidence of mitral stenosis. Moderate mitral annular  calcification.   4. The aortic valve is tricuspid. There is moderate calcification of the  aortic valve. There is moderate thickening of the aortic valve. Aortic  valve regurgitation is not visualized. Mild to moderate aortic valve  stenosis. Aortic valve mean gradient  measures 16.5 mmHg. Aortic valve peak gradient measures 28.7 mmHg. Aortic  valve area, by VTI measures 2.80 cm.   5. The inferior vena cava is normal in size with greater than 50%  respiratory variability, suggesting right atrial pressure of 3 mmHg.   Lexiscan Myoview 04/01/2022:   The study is normal. There are no perfusion defects  The study is low risk.   No ST deviation was noted.   LV perfusion is normal.   Left ventricular function is normal. Nuclear stress EF: 67 %. The left ventricular ejection fraction is hyperdynamic (>65%). End diastolic cavity size is normal.  Assessment and Plan:  1.  CAD status post DES to the LAD and circumflex in 2014, angioplasty of the circumflex for treatment of in-stent restenosis in 2018, DES x 2 to the proximal to mid circumflex in 2018, and ultimately CABG with LIMA to LAD and SVG to OM in December 2021.  Recent ischemic workup was reassuring as discussed above and LVEF remains normal at 60 to 65%.  Plan to continue medical therapy and observation for now in the  absence of accelerating symptoms.  Continue aspirin, Coreg, Lipitor, lisinopril, and increase Imdur to 30 mg daily.  2.  Degenerative calcific aortic stenosis, mild to moderate with mean AV gradient 16.5 mmHg by recent echocardiogram.  Asymptomatic at this time.  We will continue to follow, likely echocardiogram in March of next year.  3.  Essential hypertension.  No changes to current regimen.  4.  Hypomagnesemia.  He is on Lasix with potassium supplement.  Adding magnesium supplement.  Disposition:  Follow up  6 months.  Signed, Satira Sark, M.D., F.A.C.C.

## 2022-04-12 ENCOUNTER — Ambulatory Visit: Payer: Medicare Other | Attending: Cardiology | Admitting: Cardiology

## 2022-04-12 ENCOUNTER — Encounter: Payer: Self-pay | Admitting: Cardiology

## 2022-04-12 VITALS — BP 134/78 | HR 87 | Ht 69.0 in | Wt 200.2 lb

## 2022-04-12 DIAGNOSIS — I350 Nonrheumatic aortic (valve) stenosis: Secondary | ICD-10-CM | POA: Diagnosis not present

## 2022-04-12 DIAGNOSIS — I25119 Atherosclerotic heart disease of native coronary artery with unspecified angina pectoris: Secondary | ICD-10-CM | POA: Diagnosis not present

## 2022-04-12 DIAGNOSIS — E782 Mixed hyperlipidemia: Secondary | ICD-10-CM | POA: Diagnosis not present

## 2022-04-12 MED ORDER — MAGNESIUM 400 MG PO TABS: 1.0000 | ORAL_TABLET | Freq: Every day | ORAL | 3 refills | Status: DC

## 2022-04-12 MED ORDER — ISOSORBIDE MONONITRATE ER 30 MG PO TB24: 30.0000 mg | ORAL_TABLET | Freq: Every day | ORAL | 3 refills | Status: DC

## 2022-04-12 NOTE — Patient Instructions (Addendum)
Medication Instructions:  Your physician has recommended you make the following change in your medication:  Start magnesium 400 mg daily Increase isosorbide mononitrate to 30 mg daily Continue other medications the same  Labwork: none  Testing/Procedures: none  Follow-Up: Your physician recommends that you schedule a follow-up appointment in: 6 months  Any Other Special Instructions Will Be Listed Below (If Applicable).  If you need a refill on your cardiac medications before your next appointment, please call your pharmacy.

## 2022-04-21 ENCOUNTER — Telehealth: Payer: Self-pay | Admitting: Cardiology

## 2022-04-21 MED ORDER — MAGNESIUM 400 MG PO TABS: 1.0000 | ORAL_TABLET | Freq: Every day | ORAL | 3 refills | Status: DC

## 2022-04-21 NOTE — Telephone Encounter (Signed)
*  STAT* If patient is at the pharmacy, call can be transferred to refill team.   1. Which medications need to be refilled? (please list name of each medication and dose if known)  Magnesium 400 MG TABS  2. Which pharmacy/location (including street and city if local pharmacy) is medication to be sent to? [facility] ([place], [place]    3. Do they need a 30 day or 90 day supply?   Patient states he contacted [facility] and they are saying they have no record of receiving the prescription. He would like to know if it can be sent again.

## 2022-05-01 DIAGNOSIS — E1165 Type 2 diabetes mellitus with hyperglycemia: Secondary | ICD-10-CM | POA: Diagnosis not present

## 2022-05-01 DIAGNOSIS — Z6830 Body mass index (BMI) 30.0-30.9, adult: Secondary | ICD-10-CM | POA: Diagnosis not present

## 2022-05-01 DIAGNOSIS — R0789 Other chest pain: Secondary | ICD-10-CM | POA: Diagnosis not present

## 2022-05-04 ENCOUNTER — Other Ambulatory Visit: Payer: Self-pay | Admitting: Cardiology

## 2022-05-31 DIAGNOSIS — Z961 Presence of intraocular lens: Secondary | ICD-10-CM | POA: Diagnosis not present

## 2022-05-31 DIAGNOSIS — H1045 Other chronic allergic conjunctivitis: Secondary | ICD-10-CM | POA: Diagnosis not present

## 2022-05-31 DIAGNOSIS — Z9849 Cataract extraction status, unspecified eye: Secondary | ICD-10-CM | POA: Diagnosis not present

## 2022-05-31 DIAGNOSIS — H401121 Primary open-angle glaucoma, left eye, mild stage: Secondary | ICD-10-CM | POA: Diagnosis not present

## 2022-05-31 DIAGNOSIS — E119 Type 2 diabetes mellitus without complications: Secondary | ICD-10-CM | POA: Diagnosis not present

## 2022-05-31 DIAGNOSIS — H43813 Vitreous degeneration, bilateral: Secondary | ICD-10-CM | POA: Diagnosis not present

## 2022-06-29 DIAGNOSIS — D692 Other nonthrombocytopenic purpura: Secondary | ICD-10-CM | POA: Diagnosis not present

## 2022-06-29 DIAGNOSIS — L720 Epidermal cyst: Secondary | ICD-10-CM | POA: Diagnosis not present

## 2022-06-29 DIAGNOSIS — L905 Scar conditions and fibrosis of skin: Secondary | ICD-10-CM | POA: Diagnosis not present

## 2022-06-29 DIAGNOSIS — D1801 Hemangioma of skin and subcutaneous tissue: Secondary | ICD-10-CM | POA: Diagnosis not present

## 2022-06-29 DIAGNOSIS — L570 Actinic keratosis: Secondary | ICD-10-CM | POA: Diagnosis not present

## 2022-06-29 DIAGNOSIS — Z85828 Personal history of other malignant neoplasm of skin: Secondary | ICD-10-CM | POA: Diagnosis not present

## 2022-06-29 DIAGNOSIS — L821 Other seborrheic keratosis: Secondary | ICD-10-CM | POA: Diagnosis not present

## 2022-07-01 DIAGNOSIS — N400 Enlarged prostate without lower urinary tract symptoms: Secondary | ICD-10-CM | POA: Diagnosis not present

## 2022-07-01 DIAGNOSIS — R030 Elevated blood-pressure reading, without diagnosis of hypertension: Secondary | ICD-10-CM | POA: Diagnosis not present

## 2022-07-01 DIAGNOSIS — Z6830 Body mass index (BMI) 30.0-30.9, adult: Secondary | ICD-10-CM | POA: Diagnosis not present

## 2022-07-23 DIAGNOSIS — E1165 Type 2 diabetes mellitus with hyperglycemia: Secondary | ICD-10-CM | POA: Diagnosis not present

## 2022-07-23 DIAGNOSIS — I2510 Atherosclerotic heart disease of native coronary artery without angina pectoris: Secondary | ICD-10-CM | POA: Diagnosis not present

## 2022-07-23 DIAGNOSIS — I10 Essential (primary) hypertension: Secondary | ICD-10-CM | POA: Diagnosis not present

## 2022-07-23 DIAGNOSIS — Z6830 Body mass index (BMI) 30.0-30.9, adult: Secondary | ICD-10-CM | POA: Diagnosis not present

## 2022-07-26 DIAGNOSIS — R300 Dysuria: Secondary | ICD-10-CM | POA: Diagnosis not present

## 2022-07-30 ENCOUNTER — Emergency Department (HOSPITAL_COMMUNITY)
Admission: EM | Admit: 2022-07-30 | Discharge: 2022-07-30 | Disposition: A | Discharge status: Home / Self Care | Payer: Medicare Other | Source: Home / Self Care | Attending: Emergency Medicine | Admitting: Emergency Medicine

## 2022-07-30 ENCOUNTER — Other Ambulatory Visit: Payer: Self-pay

## 2022-07-30 ENCOUNTER — Encounter (HOSPITAL_COMMUNITY): Payer: Self-pay

## 2022-07-30 DIAGNOSIS — R339 Retention of urine, unspecified: Secondary | ICD-10-CM | POA: Diagnosis not present

## 2022-07-30 DIAGNOSIS — R338 Other retention of urine: Secondary | ICD-10-CM

## 2022-07-30 DIAGNOSIS — Z7984 Long term (current) use of oral hypoglycemic drugs: Secondary | ICD-10-CM | POA: Diagnosis not present

## 2022-07-30 DIAGNOSIS — I10 Essential (primary) hypertension: Secondary | ICD-10-CM | POA: Diagnosis not present

## 2022-07-30 DIAGNOSIS — Z7982 Long term (current) use of aspirin: Secondary | ICD-10-CM | POA: Diagnosis not present

## 2022-07-30 DIAGNOSIS — I2510 Atherosclerotic heart disease of native coronary artery without angina pectoris: Secondary | ICD-10-CM | POA: Insufficient documentation

## 2022-07-30 DIAGNOSIS — E119 Type 2 diabetes mellitus without complications: Secondary | ICD-10-CM | POA: Insufficient documentation

## 2022-07-30 DIAGNOSIS — Z79899 Other long term (current) drug therapy: Secondary | ICD-10-CM | POA: Insufficient documentation

## 2022-07-30 LAB — CBC WITH DIFFERENTIAL/PLATELET
Abs Immature Granulocytes: 0.02 10*3/uL (ref 0.00–0.07)
Basophils Absolute: 0 10*3/uL (ref 0.0–0.1)
Basophils Relative: 0 %
Eosinophils Absolute: 0.1 10*3/uL (ref 0.0–0.5)
Eosinophils Relative: 2 %
HCT: 42.5 % (ref 39.0–52.0)
Hemoglobin: 14.1 g/dL (ref 13.0–17.0)
Immature Granulocytes: 0 %
Lymphocytes Relative: 27 %
Lymphs Abs: 1.7 10*3/uL (ref 0.7–4.0)
MCH: 31.5 pg (ref 26.0–34.0)
MCHC: 33.2 g/dL (ref 30.0–36.0)
MCV: 94.9 fL (ref 80.0–100.0)
Monocytes Absolute: 0.6 10*3/uL (ref 0.1–1.0)
Monocytes Relative: 9 %
Neutro Abs: 3.9 10*3/uL (ref 1.7–7.7)
Neutrophils Relative %: 62 %
Platelets: 148 10*3/uL — ABNORMAL LOW (ref 150–400)
RBC: 4.48 MIL/uL (ref 4.22–5.81)
RDW: 12.3 % (ref 11.5–15.5)
WBC: 6.3 10*3/uL (ref 4.0–10.5)
nRBC: 0 % (ref 0.0–0.2)

## 2022-07-30 LAB — COMPREHENSIVE METABOLIC PANEL
ALT: 27 U/L (ref 0–44)
AST: 24 U/L (ref 15–41)
Albumin: 3.6 g/dL (ref 3.5–5.0)
Alkaline Phosphatase: 40 U/L (ref 38–126)
Anion gap: 7 (ref 5–15)
BUN: 15 mg/dL (ref 8–23)
CO2: 26 mmol/L (ref 22–32)
Calcium: 8.4 mg/dL — ABNORMAL LOW (ref 8.9–10.3)
Chloride: 104 mmol/L (ref 98–111)
Creatinine, Ser: 0.84 mg/dL (ref 0.61–1.24)
GFR, Estimated: >60 mL/min (ref >60)
Glucose, Bld: 147 mg/dL — ABNORMAL HIGH (ref 70–99)
Potassium: 3.8 mmol/L (ref 3.5–5.1)
Sodium: 137 mmol/L (ref 135–145)
Total Bilirubin: 1 mg/dL (ref 0.3–1.2)
Total Protein: 6.6 g/dL (ref 6.5–8.1)

## 2022-07-30 LAB — URINALYSIS, ROUTINE W REFLEX MICROSCOPIC
Bilirubin Urine: NEGATIVE
Glucose, UA: NEGATIVE mg/dL
Hgb urine dipstick: NEGATIVE
Ketones, ur: NEGATIVE mg/dL
Leukocytes,Ua: NEGATIVE
Nitrite: NEGATIVE
Protein, ur: NEGATIVE mg/dL
Specific Gravity, Urine: 1.021 (ref 1.005–1.030)
pH: 6 (ref 5.0–8.0)

## 2022-07-30 NOTE — ED Provider Notes (Signed)
[place] EMERGENCY DEPARTMENT AT [facility] Provider Note   CSN: [id number] Arrival date & time: 07/30/22  4098     History  Chief Complaint  Patient presents with   Urine Retension    Sean Villarreal is a 82 y.o. male.  HPI     Sean Villarreal is a 82 y.o. male with past medical history of hypertension, NSTEMI, coronary artery disease, kidney stones, type 2 diabetes, who presents to the Emergency Department complaining of urinary retention for several days.  States he is been "dribbling" for a few days, this morning woke up and unable to urinate.  Feels some discomfort of his bladder but no significant pain.  States he has been to his PCP for this and told that he had "prostate problem" had multiple urine test without evidence of infection.  He is awaiting follow-up appointment with urology.  States his PCP started on him on antibiotic but caused his blood pressure to become elevated.  That antibiotic was discontinued and he was started on Bactrim, has been taking twice daily.  Home Medications Prior to Admission medications   Medication Sig Start Date End Date Taking? Authorizing Provider  aspirin EC 81 MG tablet Take 81 mg by mouth in the morning. Swallow whole.    [provider]  atorvastatin (LIPITOR) 80 MG tablet Take 80 mg by mouth at bedtime. 05/06/12   Edmisten, Herby Abraham, PA-C  carvedilol (COREG) 25 MG tablet Take 1 tablet (25 mg total) by mouth 2 (two) times daily with a meal. 05/15/12   Barrett, Rhonda G, PA-C  COSOPT 2-0.5 % ophthalmic solution Place 1 drop into the left eye 2 (two) times daily. 03/03/22   [provider]  furosemide (LASIX) 20 MG tablet TAKE 1 TABLET BY MOUTH DAILY 03/03/22   Jonelle Sidle, MD  glimepiride (AMARYL) 1 MG tablet Take 1 mg by mouth daily with breakfast.    [provider]  isosorbide mononitrate (IMDUR) 30 MG 24 hr tablet Take 1 tablet (30 mg total) by mouth daily. 04/12/22   Jonelle Sidle, MD   latanoprost (XALATAN) 0.005 % ophthalmic solution SMARTSIG:In Eye(s) 10/06/21   [provider]  lisinopril (ZESTRIL) 40 MG tablet Take 40 mg by mouth daily.    [provider]  Magnesium 400 MG TABS Take 1 tablet by mouth daily. 04/21/22   Jonelle Sidle, MD  metFORMIN (GLUCOPHAGE) 1000 MG tablet Take 1,000 mg by mouth 2 (two) times daily with a meal.    [provider]  nitroGLYCERIN (NITROSTAT) 0.4 MG SL tablet Place 0.4 mg under the tongue every 5 (five) minutes x 3 doses as needed for chest pain.    [provider]  Polyethyl Glycol-Propyl Glycol (LUBRICANT EYE DROPS) 0.4-0.3 % SOLN Place 1-2 drops into both eyes 3 (three) times daily as needed (dry/irritated eyes).    [provider]  Potassium Chloride ER 20 MEQ TBCR TAKE 2 TABLETS BY MOUTH DAILY 05/04/22   Jonelle Sidle, MD  tamsulosin (FLOMAX) 0.4 MG CAPS capsule Take 0.4 mg by mouth 2 (two) times daily.    [provider]  TRULICITY 0.75 MG/0.5ML SOPN Inject into the skin. 09/29/21   [provider]  amiodarone (PACERONE) 200 MG tablet Take 1 tablet (200 mg total) by mouth daily. 02/13/20 03/24/20  Iran Ouch, Lennart Pall, PA-C  apixaban (ELIQUIS) 5 MG TABS tablet Take 1 tablet (5 mg total) by mouth 2 (two) times daily. 02/13/20 03/24/20  Randall An  M, PA-C      Allergies    Contrast media [iodinated contrast media], Penicillins, and Jardiance [empagliflozin]    Review of Systems   Review of Systems  Constitutional:  Negative for appetite change, chills and fever.  Respiratory:  Negative for shortness of breath.   Cardiovascular:  Negative for chest pain.  Gastrointestinal:  Negative for nausea.  Genitourinary:  Positive for decreased urine volume and difficulty urinating. Negative for flank pain, hematuria, penile pain, penile swelling, scrotal swelling and testicular pain.  Neurological:  Negative for dizziness, weakness and numbness.  Psychiatric/Behavioral:   Negative for confusion.     Physical Exam Updated Vital Signs Ht 5\' 9"  (1.753 m)   Wt 90.8 kg   BMI 29.56 kg/m  Physical Exam Vitals and nursing note reviewed.  Constitutional:      General: He is not in acute distress.    Appearance: Normal appearance. He is not ill-appearing or toxic-appearing.  HENT:     Mouth/Throat:     Mouth: Mucous membranes are moist.  Cardiovascular:     Rate and Rhythm: Normal rate and regular rhythm.     Pulses: Normal pulses.  Pulmonary:     Effort: Pulmonary effort is normal.  Abdominal:     Palpations: Abdomen is soft.     Tenderness: There is no abdominal tenderness.  Musculoskeletal:        General: Normal range of motion.     Right lower leg: No edema.     Left lower leg: No edema.  Skin:    General: Skin is warm.     Capillary Refill: Capillary refill takes less than 2 seconds.  Neurological:     General: No focal deficit present.     Mental Status: He is alert.     Sensory: No sensory deficit.     Motor: No weakness.     ED Results / Procedures / Treatments   Labs (all labs ordered are listed, but only abnormal results are displayed) Labs Reviewed  CBC WITH DIFFERENTIAL/PLATELET - Abnormal; Notable for the following components:      Result Value   Platelets 148 (*)    All other components within normal limits  COMPREHENSIVE METABOLIC PANEL - Abnormal; Notable for the following components:   Glucose, Bld 147 (*)    Calcium 8.4 (*)    All other components within normal limits  URINALYSIS, ROUTINE W REFLEX MICROSCOPIC    EKG None  Radiology No results found.  Procedures Procedures    Medications Ordered in ED Medications - No data to display  ED Course/ Medical Decision Making/ A&P                             Medical Decision Making Patient here with acute urinary retention, voiding smaller amounts of urine in the last several days.  Unable to urinate this morning upon waking.  Denies hematuria or abdominal  pain.  States he has been told his prostate is enlarged and he is awaiting urology appointment.  Currently on antibiotic from PCPs office.  Differential would include but not limited to acute urinary retention secondary to enlarged prostate, obstructive uropathy, infection, neoplasm  On review of medical records, patient was noted to have renal ultrasound in January of this year that showed prominent prostate measuring 3.8 x 3.1 x 5.2 cm  Amount and/or Complexity of Data Reviewed Labs: ordered.    Details: Labs today unremarkable, specifically urine without  evidence of hematuria or infection Discussion of management or test interpretation with external provider(s):   Bladder scan revealed 261 cc   Foley catheter placed by nursing staff, currently draining straw-colored urine approximately 100 cc in the Foley bag.  On recheck, patient resting comfortably.  Acute urinary retention likely related to enlarged prostate.  Already taking Flomax.  Will provide leg bag and ambulatory referral to urology.  Patient agreeable to plan.  No evidence of hematuria or infection to urine.  Will have patient discontinue his current antibiotic.           Final Clinical Impression(s) / ED Diagnoses Final diagnoses:  Acute urinary retention    Rx / DC Orders ED Discharge Orders     None         Rosey Bath 07/30/22 1154    Rondel Baton, MD 08/01/22 1925

## 2022-07-30 NOTE — ED Notes (Signed)
Changed to leg bag Long conversation with pt and family on care and cleaning Pt to f/u with urology

## 2022-07-30 NOTE — Discharge Instructions (Signed)
Keep the Foley catheter in place until you are seen by urology.  Someone from the urology office will likely contact you to arrange follow-up appointment.  Your urine test today did not show any evidence of infection.  You may discontinue the antibiotic.  Return to the emergency department for any new or worsening symptoms.

## 2022-07-30 NOTE — ED Triage Notes (Signed)
Pt comes in with complaints of having off and on urine retention problems for the past 3 days. Pt states no urge to urinate this morning with no pain is awaiting a phone call from urology for a referral appointment made by PCP.

## 2022-07-30 NOTE — ED Notes (Signed)
Introduced self to pt Pt stated that he has had a problem with urination on and off x1 year More frequently over the last 3 days Foley placed by previous shift Denies pain at this time Vitals assessed Waiting on urology referral

## 2022-08-03 ENCOUNTER — Encounter (HOSPITAL_COMMUNITY): Payer: Self-pay | Admitting: Emergency Medicine

## 2022-08-03 ENCOUNTER — Emergency Department (HOSPITAL_COMMUNITY)
Admission: EM | Admit: 2022-08-03 | Discharge: 2022-08-03 | Disposition: A | Discharge status: Home / Self Care | Payer: Medicare Other | Attending: Emergency Medicine | Admitting: Emergency Medicine

## 2022-08-03 DIAGNOSIS — I110 Hypertensive heart disease with heart failure: Secondary | ICD-10-CM | POA: Diagnosis not present

## 2022-08-03 DIAGNOSIS — Z7984 Long term (current) use of oral hypoglycemic drugs: Secondary | ICD-10-CM | POA: Insufficient documentation

## 2022-08-03 DIAGNOSIS — R1030 Lower abdominal pain, unspecified: Secondary | ICD-10-CM | POA: Diagnosis not present

## 2022-08-03 DIAGNOSIS — Z7901 Long term (current) use of anticoagulants: Secondary | ICD-10-CM | POA: Diagnosis not present

## 2022-08-03 DIAGNOSIS — Z79899 Other long term (current) drug therapy: Secondary | ICD-10-CM | POA: Insufficient documentation

## 2022-08-03 DIAGNOSIS — I509 Heart failure, unspecified: Secondary | ICD-10-CM | POA: Diagnosis not present

## 2022-08-03 DIAGNOSIS — R102 Pelvic and perineal pain: Secondary | ICD-10-CM

## 2022-08-03 DIAGNOSIS — E119 Type 2 diabetes mellitus without complications: Secondary | ICD-10-CM | POA: Insufficient documentation

## 2022-08-03 DIAGNOSIS — Z7982 Long term (current) use of aspirin: Secondary | ICD-10-CM | POA: Diagnosis not present

## 2022-08-03 LAB — CBC
HCT: 44.7 % (ref 39.0–52.0)
Hemoglobin: 14.8 g/dL (ref 13.0–17.0)
MCH: 31.9 pg (ref 26.0–34.0)
MCHC: 33.1 g/dL (ref 30.0–36.0)
MCV: 96.3 fL (ref 80.0–100.0)
Platelets: 170 10*3/uL (ref 150–400)
RBC: 4.64 MIL/uL (ref 4.22–5.81)
RDW: 12.6 % (ref 11.5–15.5)
WBC: 10.6 10*3/uL — ABNORMAL HIGH (ref 4.0–10.5)
nRBC: 0 % (ref 0.0–0.2)

## 2022-08-03 LAB — BASIC METABOLIC PANEL
Anion gap: 10 (ref 5–15)
BUN: 14 mg/dL (ref 8–23)
CO2: 26 mmol/L (ref 22–32)
Calcium: 9.2 mg/dL (ref 8.9–10.3)
Chloride: 104 mmol/L (ref 98–111)
Creatinine, Ser: 0.9 mg/dL (ref 0.61–1.24)
GFR, Estimated: >60 mL/min (ref >60)
Glucose, Bld: 131 mg/dL — ABNORMAL HIGH (ref 70–99)
Potassium: 4.4 mmol/L (ref 3.5–5.1)
Sodium: 140 mmol/L (ref 135–145)

## 2022-08-03 MED ORDER — OXYBUTYNIN CHLORIDE ER 5 MG PO TB24: 10.0000 mg | ORAL_TABLET | Freq: Every day | ORAL | 0 refills | Status: DC

## 2022-08-03 NOTE — ED Triage Notes (Signed)
Pt via POV c/o bladder pain and penile pain r/t indwelling foley catheter. He states the cath was inserted on Friday and has become increasingly uncomfortable. Catheter is still draining urine and does not seem to be occluded; pt states it feels like he has to constantly urinate. Pain at meatus w/o bleeding or discharge noted.

## 2022-08-03 NOTE — ED Provider Triage Note (Signed)
Emergency Medicine Provider Triage Evaluation Note  LYNDEN FLEMMER , a 82 y.o. male  was evaluated in triage.  Pt complains of discomfort at the tip of his penis and bladder spasms since having catheter placed 4 days ago due to her urinary retention.  Review of Systems  Positive: Irritation at tip of penis and bladder spasms Negative: Fever, vomiting, hematuria, back pain  Physical Exam  BP (!) 150/90 (BP Location: Right Arm)   Pulse 90   Temp 98.6 F (37 C)   Resp 16   Ht 5\' 9"  (1.753 m)   Wt 90.7 kg   SpO2 97%   BMI 29.53 kg/m  Gen:   Awake, no distress   Resp:  Normal effort  MSK:   Moves extremities without difficulty  Other:    Medical Decision Making  Medically screening exam initiated at 4:07 PM.  Appropriate orders placed.  CHICK COUSINS was informed that the remainder of the evaluation will be completed by another provider, this initial triage assessment does not replace that evaluation, and the importance of remaining in the ED until their evaluation is complete.     Carmel Sacramento A, [place] 08/03/22 [id number]

## 2022-08-03 NOTE — ED Provider Notes (Signed)
[place] EMERGENCY DEPARTMENT AT [facility] Provider Note   CSN: [id number] Arrival date & time: 08/03/22  1442     History  Chief Complaint  Patient presents with   Bladder pain   Penis Pain    Sean Villarreal is a 82 y.o. male.  PMH of BPH, hypertension, diabetes, CHF.  Presents the ER for bladder spasms.  He had a Foley catheter placed for urinary retention 4 days ago and states he had some intermittent bladder spasms.  He states that sharp and usually occur when he is standing.  No fevers or chills, no hematuria.  He states overall is uncomfortable, has some discomfort at the tip of his penis as well.  Denies any drainage.  Reports he has been cleaning his Foley catheter site as instructed.  It has been draining well.   Penis Pain       Home Medications Prior to Admission medications   Medication Sig Start Date End Date Taking? Authorizing Provider  oxybutynin (DITROPAN XL) 5 MG 24 hr tablet Take 2 tablets (10 mg total) by mouth at bedtime. 08/03/22  Yes Lillias Difrancesco A, PA-C  aspirin EC 81 MG tablet Take 81 mg by mouth in the morning. Swallow whole.    [provider]  atorvastatin (LIPITOR) 80 MG tablet Take 80 mg by mouth at bedtime. 05/06/12   Edmisten, Herby Abraham, PA-C  carvedilol (COREG) 25 MG tablet Take 1 tablet (25 mg total) by mouth 2 (two) times daily with a meal. 05/15/12   Barrett, Rhonda G, PA-C  COSOPT 2-0.5 % ophthalmic solution Place 1 drop into the left eye 2 (two) times daily. 03/03/22   [provider]  furosemide (LASIX) 20 MG tablet TAKE 1 TABLET BY MOUTH DAILY 03/03/22   Jonelle Sidle, MD  glimepiride (AMARYL) 1 MG tablet Take 1 mg by mouth daily with breakfast.    [provider]  isosorbide mononitrate (IMDUR) 30 MG 24 hr tablet Take 1 tablet (30 mg total) by mouth daily. 04/12/22   Jonelle Sidle, MD  latanoprost (XALATAN) 0.005 % ophthalmic solution SMARTSIG:In Eye(s) 10/06/21   [provider]   lisinopril (ZESTRIL) 40 MG tablet Take 40 mg by mouth daily.    [provider]  Magnesium 400 MG TABS Take 1 tablet by mouth daily. 04/21/22   Jonelle Sidle, MD  metFORMIN (GLUCOPHAGE) 1000 MG tablet Take 1,000 mg by mouth 2 (two) times daily with a meal.    [provider]  nitroGLYCERIN (NITROSTAT) 0.4 MG SL tablet Place 0.4 mg under the tongue every 5 (five) minutes x 3 doses as needed for chest pain.    [provider]  Polyethyl Glycol-Propyl Glycol (LUBRICANT EYE DROPS) 0.4-0.3 % SOLN Place 1-2 drops into both eyes 3 (three) times daily as needed (dry/irritated eyes).    [provider]  Potassium Chloride ER 20 MEQ TBCR TAKE 2 TABLETS BY MOUTH DAILY 05/04/22   Jonelle Sidle, MD  tamsulosin (FLOMAX) 0.4 MG CAPS capsule Take 0.4 mg by mouth 2 (two) times daily.    [provider]  TRULICITY 0.75 MG/0.5ML SOPN Inject into the skin. 09/29/21   [provider]  amiodarone (PACERONE) 200 MG tablet Take 1 tablet (200 mg total) by mouth daily. 02/13/20 03/24/20  [place] Ouch, Lennart Pall, PA-C  apixaban (ELIQUIS) 5 MG TABS tablet Take 1 tablet (5 mg total) by mouth 2 (two) times daily. 02/13/20 03/24/20  Ellsworth Lennox, PA-C  Allergies    Contrast media [iodinated contrast media], Penicillins, and Jardiance [empagliflozin]    Review of Systems   Review of Systems  Genitourinary:  Positive for penile pain.    Physical Exam Updated Vital Signs BP (!) 160/86 (BP Location: Right Arm)   Pulse 87   Temp 98.9 F (37.2 C) (Oral)   Resp 14   Ht 5\' 9"  (1.753 m)   Wt 90.7 kg   SpO2 99%   BMI 29.53 kg/m  Physical Exam Vitals and nursing note reviewed. Exam conducted with a chaperone present.  Constitutional:      General: He is not in acute distress.    Appearance: He is well-developed.  HENT:     Head: Normocephalic and atraumatic.  Eyes:     Conjunctiva/sclera: Conjunctivae normal.  Cardiovascular:     Rate and Rhythm: Normal  rate and regular rhythm.     Heart sounds: No murmur heard. Pulmonary:     Effort: Pulmonary effort is normal. No respiratory distress.     Breath sounds: Normal breath sounds.  Abdominal:     Palpations: Abdomen is soft.     Tenderness: There is no abdominal tenderness.  Genitourinary:    Penis: Normal and uncircumcised. No discharge or lesions.   Musculoskeletal:        General: No swelling. Normal range of motion.     Cervical back: Neck supple.  Skin:    General: Skin is warm and dry.     Capillary Refill: Capillary refill takes less than 2 seconds.  Neurological:     General: No focal deficit present.     Mental Status: He is alert and oriented to person, place, and time.     Motor: No weakness.     Gait: Gait normal.  Psychiatric:        Mood and Affect: Mood normal.     ED Results / Procedures / Treatments   Labs (all labs ordered are listed, but only abnormal results are displayed) Labs Reviewed  BASIC METABOLIC PANEL - Abnormal; Notable for the following components:      Result Value   Glucose, Bld 131 (*)    All other components within normal limits  CBC - Abnormal; Notable for the following components:   WBC 10.6 (*)    All other components within normal limits    EKG None  Radiology No results found.  Procedures Procedures    Medications Ordered in ED Medications - No data to display  ED Course/ Medical Decision Making/ A&P                             Medical Decision Making Dx: UTI, Foley catheter malfunction, bladder spasms, other ED course: Patient presents with intermittent spasms of his bladder related to his Foley catheter placed 5 days ago, states today they are actually better than they have been but he was worried because his appointment is not until July 29 at urology.  Foley catheter is draining well, draining clear urine, no fevers or feeling of dysuria, no bladder tenderness just has intermittent sharp pains.  Primarily when he stands  up, RN noticed that the Foley leg bag was not attached correctly so she educated the patient on this as she feels that is likely what is causing the catheter to pull in causing the spasms.  Discussed with patient I prescribed oxybutynin in case he gets continual spasms but having no infectious symptoms,  urine is clear and is draining well, bedside bladder scan showed 0 mL in his bladder, Foley catheter bulb noted in the bladder ultrasound.  Advised on urology follow-up and strict return precautions.  Amount and/or Complexity of Data Reviewed Labs: ordered.  Risk Prescription drug management.           Final Clinical Impression(s) / ED Diagnoses Final diagnoses:  Suprapubic pain    Rx / DC Orders ED Discharge Orders          Ordered    oxybutynin (DITROPAN XL) 5 MG 24 hr tablet  Daily at bedtime        08/03/22 1717              Ma Rings, PA-C 08/03/22 1731    Eber Hong, MD 08/04/22 1222

## 2022-08-03 NOTE — Discharge Instructions (Signed)
We have prescribed a medication to help with bladder spasms, and adjusted the attachment of your leg bag which should help with your bladder spasms as well.  Follow-up with urology as scheduled.  Come back to the ER if you have new or worsening symptoms.

## 2022-08-03 NOTE — ED Notes (Signed)
Introduced self to pt Pt complains of bladder spasms when standing Noted leg bag not in clamp correctly, weight of urine in bag tugging on foley Instructed pt on the proper set up and emptying of foley.   Skin on penis clean and intact PA at bedside did bladder scan 0mL  100cc emptied from leg bag Pt stated that he emptied PTA

## 2022-08-05 ENCOUNTER — Telehealth: Payer: Self-pay

## 2022-08-06 ENCOUNTER — Telehealth: Payer: Self-pay

## 2022-08-06 NOTE — Telephone Encounter (Signed)
Transition Care Management Follow-up Telephone Call Date of discharge and from where: [facility] 7/5 How have you been since you were released from the hospital? Alright  Any questions or concerns? No  Items Reviewed: Did the pt receive and understand the discharge instructions provided? Yes  Medications obtained and verified? Yes  Other? No  Any new allergies since your discharge? No  Dietary orders reviewed? Yes   Follow up appointments reviewed:  PCP Hospital f/u appt confirmed? No  Scheduled to see on  @ . Specialist Hospital f/u appt confirmed? Yes  Scheduled to see 7/29 on  @ . Are transportation arrangements needed? No  If their condition worsens, is the pt aware to call PCP or go to the Emergency Dept.? Yes Was the patient provided with contact information for the PCP's office or ED? Yes Was to pt encouraged to call back with questions or concerns? Yes

## 2022-08-20 NOTE — Progress Notes (Unsigned)
Name: Sean Villarreal DOB: [birth year] MRN: [id number]  History of Present Illness: Sean Villarreal is a 82 y.o. male who presents today as a new patient at [facility]. All available relevant medical records have been reviewed. - GU History: 1. BPH with LUTS. - ***Taking Flomax. 2. Kidney stones.  Recent history:  - 07/30/2022: Seen in ER for urinary retention after a few days of weak / dribbling urinary stream. Bladder scan revealed 261 ml. Foley catheter placed.  - 08/03/2022: Seen in ER for intermittent painful bladder spasms. Was prescribed Oxybutynin XL 5 mg daily.  Today: He reports chief complaint of ***urinary retention.  He {Actions; denies-reports:120008} history of urinary retention prior to current episode. He reports the catheter is draining***. He {Actions; denies-reports:120008} gross hematuria. He {Actions; denies-reports:120008} flank pain. He {Actions; denies-reports:120008} abdominal pain. He {Actions; denies-reports:120008} fevers/chills/sweats. He {Actions; denies-reports:120008} nausea/vomiting.  He {Actions; denies-reports:120008} taking alpha blocker at this time (*** started ***). He {Actions; denies-reports:120008} current use of narcotic pain control. He {Actions; denies-reports:120008} current use of medications with anticholinergic potential (***).  He {Actions; denies-reports:120008} constipation. He *** taking laxatives, stool softeners, or fiber supplements.   Fall Screening: Do you usually have a device to assist in your mobility? {yes/no:20286} ***cane / ***walker / ***wheelchair   Medications: Current Outpatient Medications  Medication Sig Dispense Refill   aspirin EC 81 MG tablet Take 81 mg by mouth in the morning. Swallow whole.     atorvastatin (LIPITOR) 80 MG tablet Take 80 mg by mouth at bedtime.     carvedilol (COREG) 25 MG tablet Take 1 tablet (25 mg total) by mouth 2 (two) times daily with a meal. 60 tablet 11   COSOPT 2-0.5  % ophthalmic solution Place 1 drop into the left eye 2 (two) times daily.     furosemide (LASIX) 20 MG tablet TAKE 1 TABLET BY MOUTH DAILY 90 tablet 3   glimepiride (AMARYL) 1 MG tablet Take 1 mg by mouth daily with breakfast.     isosorbide mononitrate (IMDUR) 30 MG 24 hr tablet Take 1 tablet (30 mg total) by mouth daily. 90 tablet 3   latanoprost (XALATAN) 0.005 % ophthalmic solution SMARTSIG:In Eye(s)     lisinopril (ZESTRIL) 40 MG tablet Take 40 mg by mouth daily.     Magnesium 400 MG TABS Take 1 tablet by mouth daily. 90 tablet 3   metFORMIN (GLUCOPHAGE) 1000 MG tablet Take 1,000 mg by mouth 2 (two) times daily with a meal.     nitroGLYCERIN (NITROSTAT) 0.4 MG SL tablet Place 0.4 mg under the tongue every 5 (five) minutes x 3 doses as needed for chest pain.     oxybutynin (DITROPAN XL) 5 MG 24 hr tablet Take 2 tablets (10 mg total) by mouth at bedtime. 20 tablet 0   Polyethyl Glycol-Propyl Glycol (LUBRICANT EYE DROPS) 0.4-0.3 % SOLN Place 1-2 drops into both eyes 3 (three) times daily as needed (dry/irritated eyes).     Potassium Chloride ER 20 MEQ TBCR TAKE 2 TABLETS BY MOUTH DAILY 180 tablet 1   tamsulosin (FLOMAX) 0.4 MG CAPS capsule Take 0.4 mg by mouth 2 (two) times daily.     TRULICITY 0.75 MG/0.5ML SOPN Inject into the skin.     No current facility-administered medications for this visit.    Allergies: Allergies  Allergen Reactions   Contrast Media [Iodinated Contrast Media] Hives and Other (See Comments)    Had "welts" on arm, and kidney issues after given dye in the  past   Penicillins Other (See Comments)    Passed out as a child Has patient had a PCN reaction causing immediate rash, facial/tongue/throat swelling, SOB or lightheadedness with hypotension: Unk Has patient had a PCN reaction causing severe rash involving mucus membranes or skin necrosis: Unk Has patient had a PCN reaction that required hospitalization: Unk Has patient had a PCN reaction occurring within the  last 10 years: No If all of the above answers are "NO", then may proceed with Cephalosporin use.    Jardiance [Empagliflozin] Diarrhea    Past Medical History:  Diagnosis Date   Aortic stenosis    a. 03/2022 Echo: EF 60-65%, no rwma, mild LVH, nl RV fxn, mild-mod AS.   Arthritis    Basal cell carcinoma    Coronary artery disease    a. DES to LAD and LCx April 2014 b. PTCA ostial circumflex 03/2016 due to ISR c.  9/18 PCI/DESx2 osital Lcx (ISR), p/mLCx  d. s/p CABG on 01/24/2020 with LIMA-LAD and SVG-OM.   Essential hypertension    GERD (gastroesophageal reflux disease)    Hematuria    History of blood transfusion 09/2012   History of kidney stones    Hyperlipidemia LDL goal <70    NSTEMI (non-ST elevated myocardial infarction) (HCC) 08/2012   Post-op Afib (HCC)    a. 12/2019-01/2020 following CABG. Briefly on amio.   Type II diabetes mellitus (HCC)    Past Surgical History:  Procedure Laterality Date   BASAL CELL CARCINOMA EXCISION     "right cheek; both shoulders"   CARDIAC CATHETERIZATION     CATARACT EXTRACTION W/ INTRAOCULAR LENS  IMPLANT, BILATERAL Bilateral    COLONOSCOPY N/A 03/19/2016   Procedure: COLONOSCOPY;  Surgeon: Malissa Hippo, MD;  Location: [facility];  Service: Endoscopy;  Laterality: N/A;  9:15   CORONARY ARTERY BYPASS GRAFT N/A 01/24/2020   Procedure: CORONARY ARTERY BYPASS GRAFTING (CABG), ON PUMP, TIMES TWO, USING LEFT INTERNAL MAMMARY ARTERY AND ENDOSCOPICALLY HARVESTED RIGHT GREATER SAPHENOUS VEIN;  Surgeon: Loreli Slot, MD;  Location: [facility] OR;  Service: Open Heart Surgery;  Laterality: N/A;   CORONARY BALLOON ANGIOPLASTY N/A 04/15/2016   Procedure: Coronary Balloon Angioplasty;  Surgeon: Peter M Swaziland, MD;  Location: [facility] INVASIVE CV LAB;  Service: Cardiovascular;  Laterality: N/A;   CORONARY STENT INTERVENTION N/A 10/11/2016   Procedure: CORONARY STENT INTERVENTION;  Surgeon: Corky Crafts, MD;  Location: [facility] INVASIVE CV LAB;  Service:  Cardiovascular;  Laterality: N/A;   CYSTOSCOPY W/ URETEROSCOPY W/ LITHOTRIPSY  10/2012   /notes 11/10/2012   CYSTOSCOPY WITH STENT PLACEMENT  08/2012; 09/2012   EYE SURGERY Left ~ 02/2016   "for crinkled lens"   INGUINAL HERNIA REPAIR Left 12/24/2020   Procedure: HERNIA REPAIR INGUINAL WITH MESH;  Surgeon: Lucretia Roers, MD;  Location: [facility] ORS;  Service: General;  Laterality: Left;   LEFT HEART CATH AND CORONARY ANGIOGRAPHY N/A 04/15/2016   Procedure: Left Heart Cath and Coronary Angiography;  Surgeon: Peter M Swaziland, MD;  Location: [facility] INVASIVE CV LAB;  Service: Cardiovascular;  Laterality: N/A;   LEFT HEART CATH AND CORONARY ANGIOGRAPHY N/A 10/11/2016   Procedure: LEFT HEART CATH AND CORONARY ANGIOGRAPHY;  Surgeon: Corky Crafts, MD;  Location: [facility] INVASIVE CV LAB;  Service: Cardiovascular;  Laterality: N/A;   LEFT HEART CATH AND CORONARY ANGIOGRAPHY N/A 03/31/2018   Procedure: LEFT HEART CATH AND CORONARY ANGIOGRAPHY;  Surgeon: Lennette Bihari, MD;  Location: [facility] INVASIVE CV LAB;  Service: Cardiovascular;  Laterality:  N/A;   LEFT HEART CATH AND CORONARY ANGIOGRAPHY N/A 01/21/2020   Procedure: LEFT HEART CATH AND CORONARY ANGIOGRAPHY;  Surgeon: Lyn Records, MD;  Location: [facility] INVASIVE CV LAB;  Service: Cardiovascular;  Laterality: N/A;   LEFT HEART CATHETERIZATION WITH CORONARY ANGIOGRAM N/A 05/05/2012   Procedure: LEFT HEART CATHETERIZATION WITH CORONARY ANGIOGRAM;  Surgeon: Kathleene Hazel, MD;  Location: [facility] CATH LAB;  Service: Cardiovascular;  Laterality: N/A;   LEFT HEART CATHETERIZATION WITH CORONARY ANGIOGRAM N/A 05/15/2012   Procedure: LEFT HEART CATHETERIZATION WITH CORONARY ANGIOGRAM;  Surgeon: Kathleene Hazel, MD;  Location: [facility] CATH LAB;  Service: Cardiovascular;  Laterality: N/A;   TEE WITHOUT CARDIOVERSION N/A 01/24/2020   Procedure: TRANSESOPHAGEAL ECHOCARDIOGRAM (TEE);  Surgeon: Loreli Slot, MD;  Location: [facility] OR;  Service: Open Heart Surgery;  Laterality:  N/A;   TONSILLECTOMY  1948   Family History  Problem Relation Age of Onset   Heart disease Mother    Heart failure Mother    Heart disease Father    Heart attack Father    Colon cancer Neg Hx    Social History   Socioeconomic History   Marital status: Widowed    Spouse name: Not on file   Number of children: Not on file   Years of education: Not on file   Highest education level: Not on file  Occupational History   Not on file  Tobacco Use   Smoking status: Former    Current packs/day: 0.00    Average packs/day: 0.8 packs/day for 30.0 years (22.5 ttl pk-yrs)    Types: Cigarettes    Start date: 01/25/1982    Quit date: 11/05/1988    Years since quitting: 33.8   Smokeless tobacco: Never  Vaping Use   Vaping status: Never Used  Substance and Sexual Activity   Alcohol use: Not Currently    Alcohol/week: 0.0 standard drinks of alcohol   Drug use: No   Sexual activity: Not Currently  Other Topics Concern   Not on file  Social History Narrative   Not on file   Social Determinants of Health   Financial Resource Strain: Not on file  Food Insecurity: No Food Insecurity (03/31/2022)   Hunger Vital Sign    Worried About Running Out of Food in the Last Year: Never true    Ran Out of Food in the Last Year: Never true  Transportation Needs: No Transportation Needs (03/31/2022)   PRAPARE - Administrator, Civil Service (Medical): No    Lack of Transportation (Non-Medical): No  Physical Activity: Not on file  Stress: Not on file  Social Connections: Not on file  Intimate Partner Violence: Not At Risk (03/31/2022)   Humiliation, Afraid, Rape, and Kick questionnaire    Fear of Current or Ex-Partner: No    Emotionally Abused: No    Physically Abused: No    Sexually Abused: No    SUBJECTIVE  Review of Systems Constitutional: Patient ***denies any unintentional weight loss or change in strength lntegumentary: Patient ***denies any rashes or pruritus Eyes: Patient  denies ***dry eyes ENT: Patient ***denies dry mouth Cardiovascular: Patient ***denies chest pain or syncope Respiratory: Patient ***denies shortness of breath Gastrointestinal: Patient ***denies nausea, vomiting, constipation, or diarrhea Musculoskeletal: Patient ***denies muscle cramps or weakness Neurologic: Patient ***denies convulsions or seizures Psychiatric: Patient ***denies memory problems Allergic/Immunologic: Patient ***denies recent allergic reaction(s) Hematologic/Lymphatic: Patient denies bleeding tendencies Endocrine: Patient ***denies heat/cold intolerance  GU: As per HPI.  OBJECTIVE There were no vitals  filed for this visit. There is no height or weight on file to calculate BMI.  Physical Examination  Constitutional: ***No obvious distress; patient is ***non-toxic appearing  Cardiovascular: ***No visible lower extremity edema.  Respiratory: The patient does ***not have audible wheezing/stridor; respirations do ***not appear labored  Gastrointestinal: Abdomen ***non-distended Musculoskeletal: ***Normal ROM of UEs  Skin: ***No obvious rashes/open sores  Neurologic: CN 2-12 grossly ***intact Psychiatric: Answered questions ***appropriately with ***normal affect  Hematologic/Lymphatic/Immunologic: ***No obvious bruises or sites of spontaneous bleeding  ***GU: Catheter draining ***clear yellow urine.   ASSESSMENT No diagnosis found. *** We discussed pt's urinary retention and possible etiologies including temporary detrusor areflexia, neurogenic bladder, ***BPH, constipation, anticholinergic medication use. Voiding trial was offered.  *** Voiding trial was done and pt was able to successfully empty bladder of the contents instilled. Foley catheter to remain out. Discussion was had about increasing water intake for the next few day to insure good voiding. Pt advised to return if unable to void, or go to ER if after clinic hours.  *** Voiding trial was performed and pt  was unable to successfully empty bladder of the contents instilled. Will attempt voiding trial again in 2 weeks. If unable to pass voiding trial at that time, we will pursue further evaluation with urodynamic testing.  ***Foley catheter to remain in place. ***CIC teaching was performed by nursing staff and supplies were given/ordered from [employer]. Pt is aware they will need to perform CIC ***x/day. *** Advised pt to CIC as needed and pt is aware to call if needing to CIC regularly so we can order supplies for them. *** Topical lidocaine jelly ordered for use as lubricant for comfort; may also be applied to urethra prior to CIC.  Will plan for follow up in ***months or sooner if needed. Pt verbalized understanding and agreement. All questions were answered.  PLAN Advised the following: Foley catheter ***discontinued. ***No follow-ups on file.  No orders of the defined types were placed in this encounter.   It has been explained that the patient is to follow regularly with their PCP in addition to all other providers involved in their care and to follow instructions provided by these respective offices. Patient advised to contact urology clinic if any urologic-pertaining questions, concerns, new symptoms or problems arise in the interim period.  There are no Patient Instructions on file for this visit.  Electronically signed by:  Donnita Falls, MSN, FNP-C, CUNP 08/20/2022 10:26 AM

## 2022-08-23 ENCOUNTER — Encounter: Payer: Self-pay | Admitting: Urology

## 2022-08-23 ENCOUNTER — Ambulatory Visit (INDEPENDENT_AMBULATORY_CARE_PROVIDER_SITE_OTHER): Payer: Medicare Other | Admitting: Urology

## 2022-08-23 VITALS — BP 172/90 | HR 91 | Temp 98.3°F

## 2022-08-23 DIAGNOSIS — N3289 Other specified disorders of bladder: Secondary | ICD-10-CM | POA: Diagnosis not present

## 2022-08-23 DIAGNOSIS — R339 Retention of urine, unspecified: Secondary | ICD-10-CM

## 2022-08-23 DIAGNOSIS — N401 Enlarged prostate with lower urinary tract symptoms: Secondary | ICD-10-CM | POA: Diagnosis not present

## 2022-08-23 DIAGNOSIS — Z978 Presence of other specified devices: Secondary | ICD-10-CM

## 2022-08-23 MED ORDER — CIPROFLOXACIN HCL 500 MG PO TABS
500.0000 mg | ORAL_TABLET | Freq: Once | ORAL | Status: AC
Start: 2022-08-23 — End: 2022-08-23
  Administered 2022-08-23: 500 mg via ORAL

## 2022-08-23 MED ORDER — PHENAZOPYRIDINE HCL 100 MG PO TABS
100.0000 mg | ORAL_TABLET | Freq: Three times a day (TID) | ORAL | 0 refills | Status: DC | PRN
Start: 2022-08-23 — End: 2023-04-19

## 2022-08-23 NOTE — Patient Instructions (Signed)
    Step 1 Get all of your supplies ready and place them near you. Step 2 Wash your hands with warm, soapy water or put on gloves. Step 3 Wash around the tip of your penis with warm, soapy water or a castille soap towelette. Step 4 Take catheter out of package and drain the lubricant over toilet. Step 5 Hold the penis at a 45 degree angle from the stomach in one hand and the catheter in the other hand. Step 6 Insert the catheter slowly into your urethra. If there is resistance when the catheter reaches the sphincter muscle,              take a deep breath and gently apply steady pressure.              DO NOT FORCE THE CATHETER Step 7 When the urine begins to flow, insert another inch and lower penis. Allow the urine to flow into the toilet. Step 8 When the flow of urine stops, slowly remove the catheter.

## 2022-08-23 NOTE — Progress Notes (Signed)
Simple Catheter Placement  Due to urinary retention patient is present today for a foley cath placement.  Patient was cleaned and prepped in a sterile fashion with betadine and 2% lidocaine jelly was instilled into the urethra. A 16 coude FR foley catheter was inserted, urine return was noted  5 ml, urine was yellow in color.  The balloon was filled with 10cc of sterile water.  A leg bag was attached for drainage. Patient was also given a night bag to take home and was given instruction on how to change from one bag to another.  Patient was given instruction on proper catheter care.  Patient tolerated well, no complications were noted   Performed by: Kennyth Lose, CMA  Additional notes/ Follow up: Return for 1st available cystoscopy with Dr. Ronne Binning.

## 2022-08-23 NOTE — Progress Notes (Signed)
Fill and Pull Catheter Removal  Patient is present today for a catheter removal.  Patient was cleaned and prepped in a sterile fashion 60ml of sterile water/ saline was instilled into the bladder when the patient felt the urge to urinate. Patient then began to have bladder spasms and the water began to come back up through syringe. This step was repeated and pt continued to have bladder spasms. 10ml of water was then drained from the balloon.  A 16FR foley cath was removed from the bladder no complications were noted per FNP.  Patient tolerated well.  Performed by: Platon Arocho LPn  Follow up/ Additional notes: Per FNP note

## 2022-08-26 ENCOUNTER — Telehealth: Payer: Self-pay

## 2022-08-26 ENCOUNTER — Ambulatory Visit: Payer: Medicare Other

## 2022-08-26 NOTE — Telephone Encounter (Signed)
Patient left voice message that he is leaking around the catheter. Patient is made aware that the leaking is due to bladder spasm. Patient voiced understanding

## 2022-09-23 ENCOUNTER — Ambulatory Visit (INDEPENDENT_AMBULATORY_CARE_PROVIDER_SITE_OTHER): Payer: Medicare Other

## 2022-09-23 DIAGNOSIS — R339 Retention of urine, unspecified: Secondary | ICD-10-CM | POA: Diagnosis not present

## 2022-09-23 MED ORDER — CIPROFLOXACIN HCL 500 MG PO TABS
500.0000 mg | ORAL_TABLET | Freq: Once | ORAL | Status: AC
Start: 2022-09-23 — End: 2022-09-23
  Administered 2022-09-23: 500 mg via ORAL

## 2022-09-23 NOTE — Progress Notes (Signed)
Cath Change/ Replacement  Patient is present today for a catheter change due to urinary retention.  10ml of water was removed from the balloon, a 16FR coude foley cath was removed without difficulty.  Patient was cleaned and prepped in a sterile fashion with betadine and 2% lidocaine jelly was instilled into the urethra. A 16 FR foley cath was replaced into the bladder, no complications were noted. Urine return was noted 50ml and urine was yellow in color. The balloon was filled with 10ml of sterile water. A leg bag was attached for drainage.  Patient was given proper instruction on catheter care.    Performed by: Braydin Aloi LPN  Follow up: keep scheduled NV

## 2022-09-24 DIAGNOSIS — N400 Enlarged prostate without lower urinary tract symptoms: Secondary | ICD-10-CM | POA: Diagnosis not present

## 2022-09-24 DIAGNOSIS — R600 Localized edema: Secondary | ICD-10-CM | POA: Diagnosis not present

## 2022-09-24 DIAGNOSIS — E7801 Familial hypercholesterolemia: Secondary | ICD-10-CM | POA: Diagnosis not present

## 2022-09-24 DIAGNOSIS — I10 Essential (primary) hypertension: Secondary | ICD-10-CM | POA: Diagnosis not present

## 2022-09-24 DIAGNOSIS — E1165 Type 2 diabetes mellitus with hyperglycemia: Secondary | ICD-10-CM | POA: Diagnosis not present

## 2022-09-28 DIAGNOSIS — H52223 Regular astigmatism, bilateral: Secondary | ICD-10-CM | POA: Diagnosis not present

## 2022-09-28 DIAGNOSIS — H5203 Hypermetropia, bilateral: Secondary | ICD-10-CM | POA: Diagnosis not present

## 2022-09-28 DIAGNOSIS — Z961 Presence of intraocular lens: Secondary | ICD-10-CM | POA: Diagnosis not present

## 2022-09-28 DIAGNOSIS — Z9849 Cataract extraction status, unspecified eye: Secondary | ICD-10-CM | POA: Diagnosis not present

## 2022-09-28 DIAGNOSIS — H401122 Primary open-angle glaucoma, left eye, moderate stage: Secondary | ICD-10-CM | POA: Diagnosis not present

## 2022-09-28 DIAGNOSIS — H43813 Vitreous degeneration, bilateral: Secondary | ICD-10-CM | POA: Diagnosis not present

## 2022-09-28 DIAGNOSIS — H524 Presbyopia: Secondary | ICD-10-CM | POA: Diagnosis not present

## 2022-09-28 DIAGNOSIS — E119 Type 2 diabetes mellitus without complications: Secondary | ICD-10-CM | POA: Diagnosis not present

## 2022-09-28 DIAGNOSIS — H1045 Other chronic allergic conjunctivitis: Secondary | ICD-10-CM | POA: Diagnosis not present

## 2022-10-12 DIAGNOSIS — J309 Allergic rhinitis, unspecified: Secondary | ICD-10-CM | POA: Diagnosis not present

## 2022-10-12 DIAGNOSIS — N390 Urinary tract infection, site not specified: Secondary | ICD-10-CM | POA: Diagnosis not present

## 2022-10-12 DIAGNOSIS — Z6829 Body mass index (BMI) 29.0-29.9, adult: Secondary | ICD-10-CM | POA: Diagnosis not present

## 2022-10-12 DIAGNOSIS — I10 Essential (primary) hypertension: Secondary | ICD-10-CM | POA: Diagnosis not present

## 2022-10-20 NOTE — Progress Notes (Unsigned)
Cardiology Office Note  Date: 10/21/2022   ID: DEMARIE ZEHR, DOB January 01, 1941, MRN [id number]  History of Present Illness: Sean Villarreal is an 82 y.o. male last seen in March.  He is here for a routine visit.  Reports no angina or increasing dyspnea on exertion with typical activities.  No palpitations or syncope.  He tells me that he has had trouble with an enlarged prostate and difficulty urinating, has an indwelling Foley catheter with pending follow-up per Urology.  I reviewed his medications and we also went over his home blood pressure checks.  He continues on aspirin, Lipitor, Coreg, Imdur, lisinopril, Lasix with potassium supplement, and as needed nitroglycerin.  I went over his lab work from July.  Physical Exam: VS:  BP (!) 158/88 Comment: took meds around 7 am  Pulse 80   Ht 5\' 9"  (1.753 m)   Wt 200 lb 9.6 oz (91 kg)   SpO2 98%   BMI 29.62 kg/m , BMI Body mass index is 29.62 kg/m.  Wt Readings from Last 3 Encounters:  10/21/22 200 lb 9.6 oz (91 kg)  08/03/22 200 lb (90.7 kg)  07/30/22 200 lb 2.8 oz (90.8 kg)    General: Patient appears comfortable at rest. HEENT: Conjunctiva and lids normal. Neck: Supple, no elevated JVP or carotid bruits. Lungs: Clear to auscultation, nonlabored breathing at rest. Cardiac: Regular rate and rhythm, no S3, 2/6 systolic murmur. Extremities: No pitting edema.  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  Labwork: 03/31/2022: B Natriuretic Peptide 143.0; Magnesium 1.3 07/30/2022: ALT 27; AST 24 08/03/2022: BUN 14; Creatinine, Ser 0.90; Hemoglobin 14.8; Platelets 170; Potassium 4.4; Sodium 140   Other Studies Reviewed Today:  Echocardiogram 03/31/2022:  1. Left ventricular ejection fraction, by estimation, is 60 to 65%. The  left ventricle has normal function. The left ventricle has no regional  wall motion  abnormalities. There is mild left ventricular hypertrophy.  Left ventricular diastolic parameters  are indeterminate.   2. Right ventricular systolic function is normal. The right ventricular  size is normal. Tricuspid regurgitation signal is inadequate for assessing  PA pressure.   3. The mitral valve is normal in structure. No evidence of mitral valve  regurgitation. No evidence of mitral stenosis. Moderate mitral annular  calcification.   4. The aortic valve is tricuspid. There is moderate calcification of the  aortic valve. There is moderate thickening of the aortic valve. Aortic  valve regurgitation is not visualized. Mild to moderate aortic valve  stenosis. Aortic valve mean gradient  measures 16.5 mmHg. Aortic valve peak gradient measures 28.7 mmHg. Aortic  valve area, by VTI measures 2.80 cm.   5. The inferior vena cava is normal in size with greater than 50%  respiratory variability, suggesting right atrial pressure of 3 mmHg.    Lexiscan Myoview 04/01/2022:   The study is normal. There are no perfusion defects  The study is low risk.   No ST deviation was noted.   LV perfusion is normal.   Left ventricular function is normal. Nuclear stress EF: 67 %. The left ventricular ejection fraction is hyperdynamic (>65%). End diastolic cavity size is normal.  Assessment and Plan:  1.  CAD status post DES to the LAD and circumflex in 2014, angioplasty of the circumflex for treatment of in-stent restenosis in 2018, DES x 2 to the proximal to mid circumflex in 2018,  and ultimately CABG with LIMA to LAD and SVG to OM in December 2021.  Follow-up Lexiscan Myoview in March of this year was low risk as outlined above.  LVEF 60 to 65% by echocardiogram at that time.  He does not report any active angina at this time.  Continue aspirin, Lipitor, Imdur, and as needed nitroglycerin.   2.  Degenerative calcific aortic stenosis, mild to moderate with mean AV gradient 16.5 mmHg by echocardiogram in  March.  No significant change in cardiac murmur.  Continue observation.   3.  Essential hypertension.  Plan to start Aldactone 12.5 mg daily.  Continue lisinopril and Coreg.  Change Lasix with potassium supplement to as needed use.  Check BMET in 10 days.  Disposition:  Follow up  6 months.  Signed, Jonelle Sidle, M.D., F.A.C.C. [facility] at [facility]

## 2022-10-21 ENCOUNTER — Ambulatory Visit: Payer: Medicare Other | Attending: Cardiology | Admitting: Cardiology

## 2022-10-21 ENCOUNTER — Encounter: Payer: Self-pay | Admitting: Cardiology

## 2022-10-21 VITALS — BP 158/88 | HR 80 | Ht 69.0 in | Wt 200.6 lb

## 2022-10-21 DIAGNOSIS — I10 Essential (primary) hypertension: Secondary | ICD-10-CM | POA: Diagnosis not present

## 2022-10-21 DIAGNOSIS — I25119 Atherosclerotic heart disease of native coronary artery with unspecified angina pectoris: Secondary | ICD-10-CM | POA: Insufficient documentation

## 2022-10-21 DIAGNOSIS — Z79899 Other long term (current) drug therapy: Secondary | ICD-10-CM | POA: Insufficient documentation

## 2022-10-21 DIAGNOSIS — I350 Nonrheumatic aortic (valve) stenosis: Secondary | ICD-10-CM | POA: Insufficient documentation

## 2022-10-21 MED ORDER — FUROSEMIDE 20 MG PO TABS: 20.0000 mg | ORAL_TABLET | Freq: Every day | ORAL | Status: DC | PRN

## 2022-10-21 MED ORDER — POTASSIUM CHLORIDE ER 20 MEQ PO TBCR: 2.0000 | EXTENDED_RELEASE_TABLET | Freq: Every day | ORAL | Status: DC | PRN

## 2022-10-21 MED ORDER — SPIRONOLACTONE 25 MG PO TABS: 12.5000 mg | ORAL_TABLET | Freq: Every day | ORAL | 1 refills | Status: DC

## 2022-10-21 NOTE — Patient Instructions (Addendum)
Medication Instructions:  Your physician has recommended you make the following change in your medication:  Start spironolactone 12.5 mg daily Change furosemide 20 mg to daily as needed for swelling Change potassium 20 meq to daily as needed when taking furosemide Continue all other medications as prescribed  Labwork: BMET in 10 days (11/01/2022) @[facility] Non-fasting  Testing/Procedures: none  Follow-Up: Your physician recommends that you schedule a follow-up appointment in: 6 months  Any Other Special Instructions Will Be Listed Below (If Applicable).  If you need a refill on your cardiac medications before your next appointment, please call your pharmacy.

## 2022-10-25 ENCOUNTER — Ambulatory Visit (INDEPENDENT_AMBULATORY_CARE_PROVIDER_SITE_OTHER): Payer: Medicare Other

## 2022-10-25 DIAGNOSIS — R339 Retention of urine, unspecified: Secondary | ICD-10-CM

## 2022-10-25 NOTE — Progress Notes (Signed)
Cath Change/ Replacement  Patient is present today for a catheter change due to urinary retention.  10ml of water was removed from the balloon, a 16FR foley cath was removed without difficulty.  Patient was cleaned and prepped in a sterile fashion with betadine and 2% lidocaine jelly was instilled into the urethra. A 16 FR foley cath was replaced into the bladder, no complications were noted. Urine return was noted 20ml and urine was yellow in color. The balloon was filled with 10ml of sterile water. A leg bag was attached for drainage.  Patient was given proper instruction on catheter care.    Performed by: Guss Bunde, CMA  Follow up: Follow up with Dr. Ronne Binning for Cystoscopy on 10/16

## 2022-11-01 DIAGNOSIS — I25119 Atherosclerotic heart disease of native coronary artery with unspecified angina pectoris: Secondary | ICD-10-CM | POA: Diagnosis not present

## 2022-11-10 ENCOUNTER — Ambulatory Visit: Payer: Medicare Other | Admitting: Urology

## 2022-11-10 ENCOUNTER — Other Ambulatory Visit: Payer: Self-pay | Admitting: Cardiology

## 2022-11-10 VITALS — BP 157/84 | HR 79

## 2022-11-10 DIAGNOSIS — R338 Other retention of urine: Secondary | ICD-10-CM

## 2022-11-10 DIAGNOSIS — N401 Enlarged prostate with lower urinary tract symptoms: Secondary | ICD-10-CM

## 2022-11-10 DIAGNOSIS — R39198 Other difficulties with micturition: Secondary | ICD-10-CM | POA: Diagnosis not present

## 2022-11-10 DIAGNOSIS — R339 Retention of urine, unspecified: Secondary | ICD-10-CM

## 2022-11-10 MED ORDER — CIPROFLOXACIN HCL 500 MG PO TABS
500.0000 mg | ORAL_TABLET | Freq: Once | ORAL | Status: DC
Start: 2022-11-10 — End: 2023-03-28

## 2022-11-10 NOTE — Progress Notes (Signed)
Simple Catheter Placement  Due to urinary retention patient is present today for a foley cath placement.  Patient was cleaned and prepped in a sterile fashion with betadine.  A 16 FR foley catheter was inserted, urine return was noted  , urine was yellow in color.  The balloon was filled with 10cc of sterile water.  A leg bag was attached for drainage. Patient was also given a night bag to take home and was given instruction on how to change from one bag to another.  Patient was given instruction on proper catheter care.  Patient tolerated well, no complications were noted   Performed by: Cannen Dupras LPN  Additional notes/ Follow up: surgery/possible cath change

## 2022-11-10 NOTE — Progress Notes (Signed)
   11/10/22  CC: difficulty urinating   HPI: [name] is a 82yo here for cystoscopy for difficulty urinating Blood pressure (!) 157/84, pulse 79. NED. A&Ox3.   No respiratory distress   Abd soft, NT, ND Normal phallus with bilateral descended testicles  Cystoscopy Procedure Note  Patient identification was confirmed, informed consent was obtained, and patient was prepped using Betadine solution.  Lidocaine jelly was administered per urethral meatus.     Pre-Procedure: - Inspection reveals a normal caliber ureteral meatus.  Procedure: The flexible cystoscope was introduced without difficulty - No urethral strictures/lesions are present. - Enlarged prostate no median lobe - Normal bladder neck - Bilateral ureteral orifices identified - Bladder mucosa  reveals no ulcers, tumors, or lesions - No bladder stones - No trabeculation    Post-Procedure: - Patient tolerated the procedure well  Assessment/ Plan: We discussed the management of his BPH including continued medical therapy, Rezum, Urolift, TURP and simple prostatectomy. After discussing the options the patient has elected to proceed with TURP. Risks/benefits/alternatives discussed.   No follow-ups on file.  Sean Villarreal Aye, MD

## 2022-11-18 NOTE — Patient Instructions (Signed)
Transurethral Resection of the Prostate Transurethral resection of the prostate (TURP) is the removal, or resection, of part of the prostate tissue. This procedure is done to treat an enlarged prostate gland (benign prostatic hyperplasia). The goal of TURP is to remove enough prostate tissue to allow for a normal flow of urine. The procedure will allow you to empty your bladder more completely when you urinate so that you can urinate less often. In a transurethral resection, a thin telescope with a light, a camera, and an electric cutting edge (resectoscope) is passed through the urethra and into the prostate. The opening of the urethra is at the end of the penis. Tell a health care provider about: Any allergies you have. All medicines you are taking, including vitamins, herbs, eye drops, creams, and over-the-counter medicines. Any problems you or family members have had with anesthetic medicines. Any bleeding problems you have. Any surgeries you have had. Any medical conditions you have. Any prostate infections you have had. What are the risks? Generally, this is a safe procedure. However, problems may occur, including: Infection. Bleeding. Allergic reactions to medicines. Blood in the urine (hematuria). Damage to nearby structures or organs. Other problems may occur, but they are rare. They include: Dry ejaculation, or having no semen come out during orgasm. Erectile dysfunction, or being unable to have or keep an erection. Scarring that leads to narrowing of the urethra. This narrowing may block the flow of urine. Inability to control when you urinate (incontinence). Deep vein thrombosis. This is a blood clot that can develop in your leg. TURP syndrome. This can happen when you lose too much sodium during or after the procedure. Some signs and symptoms of this condition include: Weakness. Headaches. Nausea or vomiting. Muscle cramping. What happens before the procedure? When to stop  eating and drinking Follow instructions from your health care provider about what you may eat and drink before your procedure. These may include: 8 hours before your procedure Stop eating most foods. Do not eat meat, fried foods, or fatty foods. Eat only light foods, such as toast or crackers. All liquids are okay except energy drinks and alcohol. 6 hours before your procedure Stop eating. Drink only clear liquids, such as water, clear fruit juice, black coffee, plain tea, and sports drinks. Do not drink energy drinks or alcohol. 2 hours before your procedure Stop drinking all liquids. You may be allowed to take medicines with small sips of water. If you do not follow your health care provider's instructions, your procedure may be delayed or canceled. Medicines Ask your health care provider about: Changing or stopping your regular medicines. This is especially important if you are taking diabetes medicines or blood thinners. Taking medicines such as aspirin and ibuprofen. These medicines can thin your blood. Do not take these medicines unless your health care provider tells you to take them. Taking over-the-counter medicines, vitamins, herbs, and supplements. Surgery safety Ask your health care provider what steps will be taken to help prevent infection. These steps may include: Removing hair at the surgery site. Washing skin with a germ-killing soap. Taking antibiotic medicine. General instructions Do not use any products that contain nicotine or tobacco for at least 4 weeks before the procedure. These products include cigarettes, chewing tobacco, and vaping devices, such as e-cigarettes. If you need help quitting, ask your health care provider. If you will be going home right after the procedure, plan to have a responsible adult: Take you home from the hospital or clinic. You  will not be allowed to drive. Care for you for the time you are told. What happens during the procedure?  An  IV will be inserted into one of your veins. You will be given one or more of the following: A medicine to help you relax (sedative). A medicine to make you fall asleep (general anesthetic). A medicine that is injected into your spine to numb the area below and slightly above the injection site (spinal anesthetic). Your legs will be placed in foot rests (stirrups) so that your legs are apart and your knees are bent. The resectoscope will be passed through your urethra to your prostate. Parts of your prostate will be resected using the cutting edge of the resectoscope. Fluid will be passed to rinse out the cut tissues (irrigation). The resectoscope will be removed. A small, thin tube (catheter) will be passed through your urethra and into your bladder. The catheter will drain urine into a bag outside of your body. The procedure may vary among health care providers and hospitals. What happens after the procedure? Your blood pressure, heart rate, breathing rate, and blood oxygen level will be monitored until you leave the hospital or clinic. You will be given fluids through the IV. The IV will be removed when you start eating and drinking normally. You may have some pain. Pain medicine will be available to help you. You will have a catheter draining your urine. You may have blood in your urine. Your catheter may be kept in until your urine is clear. Your urinary drainage will be monitored. If necessary, your bladder may be rinsed out (irrigated) through your catheter. You will be encouraged to walk around as soon as possible. You may have to wear compression stockings. These stockings help to prevent blood clots and reduce swelling in your legs. If you were given a sedative during the procedure, it can affect you for several hours. Do not drive or operate machinery until your health care provider says that it is safe. Summary Transurethral resection of the prostate (TURP) is the removal  (resection) of part of the prostate tissue. The goal of this procedure is to remove enough prostate tissue to allow for a normal flow of urine. Follow instructions from your health care provider about taking medicines and about eating and drinking before the procedure. This information is not intended to replace advice given to you by your health care provider. Make sure you discuss any questions you have with your health care provider. Document Revised: 10/07/2020 Document Reviewed: 10/07/2020 Elsevier Patient Education  2024 [employer].

## 2022-11-26 ENCOUNTER — Telehealth: Payer: Self-pay

## 2022-11-26 NOTE — Telephone Encounter (Signed)
   Pre-operative Risk Assessment    Patient Name: Sean Villarreal  DOB: 17-Jul-1940 MRN: [id number]  Last office visit : 10/21/2022 Upcoming appointment : 04/19/2022      Request for Surgical Clearance    Procedure:   Cystoscopy with TURP  Date of Surgery:  Clearance 12/30/22                                 Surgeon:  Dr. Wilkie Aye Surgeon's Group or Practice Name:  [facility] [place] Phone number:  [phone] Fax number:  [phone]   Type of Clearance Requested:   - Medical  - Pharmacy:  Hold Aspirin Not indicated   Type of Anesthesia:  General    Additional requests/questions:    Elyse Jarvis   11/26/2022, 4:59 PM

## 2022-11-29 NOTE — Telephone Encounter (Signed)
   Name: Sean Villarreal  DOB: 1940/10/11  MRN: [id number]   Primary Cardiologist: Nona Dell, MD  Chart reviewed as part of pre-operative protocol coverage. RUDI BUNYARD was last seen on 10/21/2022 by Dr. Diona Browner.  Per Dr. Diona Browner "Patient clinically stable from a cardiac perspective at his most recent office visit.  The planned surgery is not in a high risk category.  RCRI perioperative cardiac risk index is class II, 6% chance of major adverse cardiac event.  He underwent both ischemic and structural cardiac testing this year as reviewed in my last note.  Unless there has been a significant change in his cardiovascular status, he should be able to proceed as planned."  Ideally aspirin should be continued without interruption, however if the bleeding risk is too great, aspirin may be held for 5-7 days prior to surgery. Please resume aspirin post operatively when it is felt to be safe from a bleeding standpoint.    I will route this recommendation to the requesting party via Epic fax function and remove from pre-op pool. Please call with questions.  Carlos Levering, NP 11/29/2022, 4:34 PM

## 2022-11-29 NOTE — Telephone Encounter (Signed)
Dr. Diona Browner,  You saw this patient on 10/21/2022. Per office protocol, will you please comment on medical clearance for Cystoscopy with TURP on 12/5?  Please route your response to [facility] Preop. I will communicate with requesting office once you have given recommendations.   Thank you!  Carlos Levering, NP

## 2022-12-03 ENCOUNTER — Telehealth: Payer: Self-pay | Admitting: Urology

## 2022-12-03 MED ORDER — MIRABEGRON ER 25 MG PO TB24: 25.0000 mg | ORAL_TABLET | Freq: Every day | ORAL | 0 refills | Status: DC

## 2022-12-03 NOTE — Telephone Encounter (Signed)
Verbal from Dr. Ronne Binning so give myrbetriq- pt started on myrbetriq

## 2022-12-03 NOTE — Telephone Encounter (Signed)
Patient thinks he has an infection in his cath, he is having leaking around cath, and sharp pains in penis from tip to half way up .

## 2022-12-09 ENCOUNTER — Ambulatory Visit: Payer: Medicare Other

## 2022-12-09 DIAGNOSIS — R339 Retention of urine, unspecified: Secondary | ICD-10-CM

## 2022-12-09 NOTE — Progress Notes (Signed)
Cath Change/ Replacement  Patient is present today for a catheter change due to urinary retention.  10ml of water was removed from the balloon, a 16FR foley cath was removed without difficulty.  Patient was cleaned and prepped in a sterile fashion with betadine and 2% lidocaine jelly was instilled into the urethra. A 16 FR foley cath was replaced into the bladder, no complications were noted. Urine return was noted 20ml and urine was yellow in color. The balloon was filled with 10ml of sterile water. A leg bag was attached for drainage. Patient was given proper instruction on catheter care.    Performed by: Guss Bunde, CMA  Follow up: surgery scheduled for 12/05 with Dr. Ronne Binning

## 2022-12-24 DIAGNOSIS — E1165 Type 2 diabetes mellitus with hyperglycemia: Secondary | ICD-10-CM | POA: Diagnosis not present

## 2022-12-24 DIAGNOSIS — I10 Essential (primary) hypertension: Secondary | ICD-10-CM | POA: Diagnosis not present

## 2022-12-28 NOTE — Patient Instructions (Signed)
Sean Villarreal  12/28/2022     @PREFPERIOPPHARMACY @   Your procedure is scheduled on  12/30/2022.   Report to [facility] at  0930  A.M.   Call this number if you have problems the morning of surgery:  [phone]  If you experience any cold or flu symptoms such as cough, fever, chills, shortness of breath, etc. between now and your scheduled surgery, please notify us at the above number.   Remember:         Your last dose of trulicity should have been on 12/22/2022.   Do not eat after midnight.    You may drink clear liquids until 0730 am on 12/30/2022.   Clear liquids allowed are:                    Water, Juice (No red color; non-citric and without pulp; diabetics please choose diet or no sugar options), Carbonated beverages (diabetics please choose diet or no sugar options), Clear Tea (No creamer, milk, or cream, including half & half and powdered creamer), Black Coffee Only (No creamer, milk or cream, including half & half and powdered creamer), and Clear Sports drink (No red color; diabetics please choose diet or no sugar options)    Take these medicines the morning of surgery with A SIP OF WATER                          carvedilol, isosorbide, tamsulosin.     Do not wear jewelry, make-up or nail polish, including gel polish,  artificial nails, or any other type of covering on natural nails (fingers and  toes).  Do not wear lotions, powders, or perfumes, or deodorant.  Do not shave 48 hours prior to surgery.  Men may shave face and neck.  Do not bring valuables to the hospital.  [facility] is not responsible for any belongings or valuables.  Contacts, dentures or bridgework may not be worn into surgery.  Leave your suitcase in the car.  After surgery it may be brought to your room.  For patients admitted to the hospital, discharge time will be determined by your treatment team.  Patients discharged the day of surgery will not be allowed to drive home and  must have someone with them for 24 hours.    Special instructions:   DO NOT smoke tobacco or vape for 24 hours before your procedure.  Please read over the following fact sheets that you were given. Surgical Site Infection Prevention, Anesthesia Post-op Instructions, and Care and Recovery After Surgery        Transurethral Resection of the Prostate, Care After The following information offers guidance on how to care for yourself after your procedure. Your health care provider may also give you more specific instructions. If you have problems or questions, contact your health care provider. What can I expect after the procedure? After the procedure, it is common to have: Mild pain in your lower abdomen. Soreness or mild discomfort in your penis or when you urinate. This is from having the catheter inserted during the procedure. A sudden urge to urinate (urgency). A need to urinate often. A small amount of blood in your urine. You may notice some small blood clots in your urine. These are normal. Follow these instructions at home: Medicines Take over-the-counter and prescription medicines only as told by your health care provider. If you were prescribed an antibiotic medicine,  take it as told by your health care provider. Do not stop taking the antibiotic even if you start to feel better. Activity  Rest as told by your health care provider. Avoid sitting for a long time without moving. Get up to take short walks every 1-2 hours. This is important to improve blood flow and breathing. Ask for help if you feel weak or unsteady. You may increase your physical activity gradually as you start to feel better. Do not drive or operate machinery until your health care provider says that it is safe. Do not ride in a car for long periods of time, or as told by your health care provider. Avoid intense physical activity for as long as told by your health care provider. Do not lift anything that is  heavier than 10 lb (4.5 kg), or the limit that you are told, until your health care provider says that it is safe. Do not have sex until your health care provider approves. Return to your normal activities as told by your health care provider. Ask your health care provider what activities are safe for you. Preventing constipation You may need to take these actions to prevent or treat constipation: Drink enough fluid to keep your urine pale yellow. Take over-the-counter or prescription medicines. Eat foods that are high in fiber, such as beans, whole grains, and fresh fruits and vegetables. Limit foods that are high in fat and processed sugars, such as fried or sweet foods.  General instructions Do not strain when you have a bowel movement. Straining may lead to bleeding from the prostate. This may cause blood clots and trouble urinating. Do not use any products that contain nicotine or tobacco. These products include cigarettes, chewing tobacco, and vaping devices, such as e-cigarettes. If you need help quitting, ask your health care provider. If you go home with a tube draining your urine (urinary catheter), care for the catheter as told by your health care provider. Wear compression stockings as told by your health care provider. These stockings help to prevent blood clots and reduce swelling in your legs. Keep all follow-up visits. This is important. Contact a health care provider if: You have signs of infection, such as: Fever or chills. Urine that smells very bad. Swelling around your urethra that is getting worse. Swelling in your penis or testicles. You have difficulty urinating. You have pain that gets worse or does not improve with medicine. You have blood in your urine that does not go away after 1 week of resting and drinking more fluids. You have trouble having a bowel movement. You have trouble having or keeping an erection. No semen comes out during orgasm (dry  ejaculation). You have a urinary catheter in place, and you have: Spasms or pain. Problems with your catheter or your catheter is blocked. Get help right away if: You are unable to urinate. You are having more blood clots in your urine instead of fewer. You have: Large blood clots. A lot of blood in your urine. Pain in your back or lower abdomen. You have difficulty breathing or shortness of breath. You develop swelling or pain in your leg. These symptoms may be an emergency. Get help right away. Call 911. Do not wait to see if the symptoms will go away. Do not drive yourself to the hospital. Summary After the procedure, it is common to have a small amount of blood in your urine. Follow restrictions about lifting and sexual activity as told by your health care  provider. Ask what activities are safe for you. Keep all follow-up visits. This is important. This information is not intended to replace advice given to you by your health care provider. Make sure you discuss any questions you have with your health care provider. Document Revised: 10/07/2020 Document Reviewed: 10/07/2020 Elsevier Patient Education  2024 [employer] General Anesthesia, Adult, Care After The following information offers guidance on how to care for yourself after your procedure. Your health care provider may also give you more specific instructions. If you have problems or questions, contact your health care provider. What can I expect after the procedure? After the procedure, it is common for people to: Have pain or discomfort at the IV site. Have nausea or vomiting. Have a sore throat or hoarseness. Have trouble concentrating. Feel cold or chills. Feel weak, sleepy, or tired (fatigue). Have soreness and body aches. These can affect parts of the body that were not involved in surgery. Follow these instructions at home: For the time period you were told by your health care provider:  Rest. Do not  participate in activities where you could fall or become injured. Do not drive or use machinery. Do not drink alcohol. Do not take sleeping pills or medicines that cause drowsiness. Do not make important decisions or sign legal documents. Do not take care of children on your own. General instructions Drink enough fluid to keep your urine pale yellow. If you have sleep apnea, surgery and certain medicines can increase your risk for breathing problems. Follow instructions from your health care provider about wearing your sleep device: Anytime you are sleeping, including during daytime naps. While taking prescription pain medicines, sleeping medicines, or medicines that make you drowsy. Return to your normal activities as told by your health care provider. Ask your health care provider what activities are safe for you. Take over-the-counter and prescription medicines only as told by your health care provider. Do not use any products that contain nicotine or tobacco. These products include cigarettes, chewing tobacco, and vaping devices, such as e-cigarettes. These can delay incision healing after surgery. If you need help quitting, ask your health care provider. Contact a health care provider if: You have nausea or vomiting that does not get better with medicine. You vomit every time you eat or drink. You have pain that does not get better with medicine. You cannot urinate or have bloody urine. You develop a skin rash. You have a fever. Get help right away if: You have trouble breathing. You have chest pain. You vomit blood. These symptoms may be an emergency. Get help right away. Call 911. Do not wait to see if the symptoms will go away. Do not drive yourself to the hospital. Summary After the procedure, it is common to have a sore throat, hoarseness, nausea, vomiting, or to feel weak, sleepy, or fatigue. For the time period you were told by your health care provider, do not drive or use  machinery. Get help right away if you have difficulty breathing, have chest pain, or vomit blood. These symptoms may be an emergency. This information is not intended to replace advice given to you by your health care provider. Make sure you discuss any questions you have with your health care provider. Document Revised: 04/10/2021 Document Reviewed: 04/10/2021 Elsevier Patient Education  2024 [employer] How to Use Chlorhexidine Before Surgery Chlorhexidine gluconate (CHG) is a germ-killing (antiseptic) solution that is used to clean the skin. It can get rid of the bacteria that normally  live on the skin and can keep them away for about 24 hours. To clean your skin with CHG, you may be given: A CHG solution to use in the shower or as part of a sponge bath. A prepackaged cloth that contains CHG. Cleaning your skin with CHG may help lower the risk for infection: While you are staying in the intensive care unit of the hospital. If you have a vascular access, such as a central line, to provide short-term or long-term access to your veins. If you have a catheter to drain urine from your bladder. If you are on a ventilator. A ventilator is a machine that helps you breathe by moving air in and out of your lungs. After surgery. What are the risks? Risks of using CHG include: A skin reaction. Hearing loss, if CHG gets in your ears and you have a perforated eardrum. Eye injury, if CHG gets in your eyes and is not rinsed out. The CHG product catching fire. Make sure that you avoid smoking and flames after applying CHG to your skin. Do not use CHG: If you have a chlorhexidine allergy or have previously reacted to chlorhexidine. On babies younger than 21 months of age. How to use CHG solution Use CHG only as told by your health care provider, and follow the instructions on the label. Use the full amount of CHG as directed. Usually, this is one bottle. During a shower Follow these steps when using  CHG solution during a shower (unless your health care provider gives you different instructions): Start the shower. Use your normal soap and shampoo to wash your face and hair. Turn off the shower or move out of the shower stream. Pour the CHG onto a clean washcloth. Do not use any type of brush or rough-edged sponge. Starting at your neck, lather your body down to your toes. Make sure you follow these instructions: If you will be having surgery, pay special attention to the part of your body where you will be having surgery. Scrub this area for at least 1 minute. Do not use CHG on your head or face. If the solution gets into your ears or eyes, rinse them well with water. Avoid your genital area. Avoid any areas of skin that have broken skin, cuts, or scrapes. Scrub your back and under your arms. Make sure to wash skin folds. Let the lather sit on your skin for 1-2 minutes or as long as told by your health care provider. Thoroughly rinse your entire body in the shower. Make sure that all body creases and crevices are rinsed well. Dry off with a clean towel. Do not put any substances on your body afterward--such as powder, lotion, or perfume--unless you are told to do so by your health care provider. Only use lotions that are recommended by the manufacturer. Put on clean clothes or pajamas. If it is the night before your surgery, sleep in clean sheets.  During a sponge bath Follow these steps when using CHG solution during a sponge bath (unless your health care provider gives you different instructions): Use your normal soap and shampoo to wash your face and hair. Pour the CHG onto a clean washcloth. Starting at your neck, lather your body down to your toes. Make sure you follow these instructions: If you will be having surgery, pay special attention to the part of your body where you will be having surgery. Scrub this area for at least 1 minute. Do not use CHG on your head  or face. If the  solution gets into your ears or eyes, rinse them well with water. Avoid your genital area. Avoid any areas of skin that have broken skin, cuts, or scrapes. Scrub your back and under your arms. Make sure to wash skin folds. Let the lather sit on your skin for 1-2 minutes or as long as told by your health care provider. Using a different clean, wet washcloth, thoroughly rinse your entire body. Make sure that all body creases and crevices are rinsed well. Dry off with a clean towel. Do not put any substances on your body afterward--such as powder, lotion, or perfume--unless you are told to do so by your health care provider. Only use lotions that are recommended by the manufacturer. Put on clean clothes or pajamas. If it is the night before your surgery, sleep in clean sheets. How to use CHG prepackaged cloths Only use CHG cloths as told by your health care provider, and follow the instructions on the label. Use the CHG cloth on clean, dry skin. Do not use the CHG cloth on your head or face unless your health care provider tells you to. When washing with the CHG cloth: Avoid your genital area. Avoid any areas of skin that have broken skin, cuts, or scrapes. Before surgery Follow these steps when using a CHG cloth to clean before surgery (unless your health care provider gives you different instructions): Using the CHG cloth, vigorously scrub the part of your body where you will be having surgery. Scrub using a back-and-forth motion for 3 minutes. The area on your body should be completely wet with CHG when you are done scrubbing. Do not rinse. Discard the cloth and let the area air-dry. Do not put any substances on the area afterward, such as powder, lotion, or perfume. Put on clean clothes or pajamas. If it is the night before your surgery, sleep in clean sheets.  For general bathing Follow these steps when using CHG cloths for general bathing (unless your health care provider gives you  different instructions). Use a separate CHG cloth for each area of your body. Make sure you wash between any folds of skin and between your fingers and toes. Wash your body in the following order, switching to a new cloth after each step: The front of your neck, shoulders, and chest. Both of your arms, under your arms, and your hands. Your stomach and groin area, avoiding the genitals. Your right leg and foot. Your left leg and foot. The back of your neck, your back, and your buttocks. Do not rinse. Discard the cloth and let the area air-dry. Do not put any substances on your body afterward--such as powder, lotion, or perfume--unless you are told to do so by your health care provider. Only use lotions that are recommended by the manufacturer. Put on clean clothes or pajamas. Contact a health care provider if: Your skin gets irritated after scrubbing. You have questions about using your solution or cloth. You swallow any chlorhexidine. Call your local poison control center ([phone] in the [place] Get help right away if: Your eyes itch badly, or they become very red or swollen. Your skin itches badly and is red or swollen. Your hearing changes. You have trouble seeing. You have swelling or tingling in your mouth or throat. You have trouble breathing. These symptoms may represent a serious problem that is an emergency. Do not wait to see if the symptoms will go away. Get medical help right away. Call your local  emergency services (911 in the [place] Do not drive yourself to the hospital. Summary Chlorhexidine gluconate (CHG) is a germ-killing (antiseptic) solution that is used to clean the skin. Cleaning your skin with CHG may help to lower your risk for infection. You may be given CHG to use for bathing. It may be in a bottle or in a prepackaged cloth to use on your skin. Carefully follow your health care provider's instructions and the instructions on the product label. Do not use CHG if  you have a chlorhexidine allergy. Contact your health care provider if your skin gets irritated after scrubbing. This information is not intended to replace advice given to you by your health care provider. Make sure you discuss any questions you have with your health care provider. Document Revised: 05/11/2021 Document Reviewed: 03/24/2020 Elsevier Patient Education  2023 [employer].

## 2022-12-29 ENCOUNTER — Encounter (HOSPITAL_COMMUNITY): Payer: Self-pay

## 2022-12-29 ENCOUNTER — Encounter (HOSPITAL_COMMUNITY)
Admission: RE | Admit: 2022-12-29 | Discharge: 2022-12-29 | Disposition: A | Discharge status: Home / Self Care | Payer: Medicare Other | Source: Ambulatory Visit | Attending: Urology | Admitting: Urology

## 2022-12-29 VITALS — BP 132/82 | HR 75 | Temp 97.8°F | Resp 18 | Ht 69.0 in | Wt 200.6 lb

## 2022-12-29 DIAGNOSIS — E1159 Type 2 diabetes mellitus with other circulatory complications: Secondary | ICD-10-CM | POA: Diagnosis not present

## 2022-12-29 DIAGNOSIS — Z01812 Encounter for preprocedural laboratory examination: Secondary | ICD-10-CM | POA: Diagnosis not present

## 2022-12-29 DIAGNOSIS — Z01818 Encounter for other preprocedural examination: Secondary | ICD-10-CM

## 2022-12-29 LAB — CBC WITH DIFFERENTIAL/PLATELET
Abs Immature Granulocytes: 0.03 10*3/uL (ref 0.00–0.07)
Basophils Absolute: 0 10*3/uL (ref 0.0–0.1)
Basophils Relative: 0 %
Eosinophils Absolute: 0.3 10*3/uL (ref 0.0–0.5)
Eosinophils Relative: 3 %
HCT: 43 % (ref 39.0–52.0)
Hemoglobin: 14.1 g/dL (ref 13.0–17.0)
Immature Granulocytes: 0 %
Lymphocytes Relative: 21 %
Lymphs Abs: 2 10*3/uL (ref 0.7–4.0)
MCH: 31.7 pg (ref 26.0–34.0)
MCHC: 32.8 g/dL (ref 30.0–36.0)
MCV: 96.6 fL (ref 80.0–100.0)
Monocytes Absolute: 0.8 10*3/uL (ref 0.1–1.0)
Monocytes Relative: 8 %
Neutro Abs: 6.1 10*3/uL (ref 1.7–7.7)
Neutrophils Relative %: 68 %
Platelets: 168 10*3/uL (ref 150–400)
RBC: 4.45 MIL/uL (ref 4.22–5.81)
RDW: 12.5 % (ref 11.5–15.5)
WBC: 9.2 10*3/uL (ref 4.0–10.5)
nRBC: 0 % (ref 0.0–0.2)

## 2022-12-29 LAB — HEMOGLOBIN A1C
Hgb A1c MFr Bld: 7.6 % — ABNORMAL HIGH (ref 4.8–5.6)
Mean Plasma Glucose: 171.42 mg/dL

## 2022-12-29 LAB — BASIC METABOLIC PANEL
Anion gap: 11 (ref 5–15)
BUN: 20 mg/dL (ref 8–23)
CO2: 25 mmol/L (ref 22–32)
Calcium: 9.4 mg/dL (ref 8.9–10.3)
Chloride: 103 mmol/L (ref 98–111)
Creatinine, Ser: 0.88 mg/dL (ref 0.61–1.24)
GFR, Estimated: >60 mL/min (ref >60)
Glucose, Bld: 205 mg/dL — ABNORMAL HIGH (ref 70–99)
Potassium: 4.1 mmol/L (ref 3.5–5.1)
Sodium: 139 mmol/L (ref 135–145)

## 2022-12-30 ENCOUNTER — Observation Stay (HOSPITAL_COMMUNITY)
Admission: RE | Admit: 2022-12-30 | Discharge: 2022-12-31 | Disposition: A | Discharge status: Home / Self Care | Payer: Medicare Other | Attending: Urology | Admitting: Urology

## 2022-12-30 ENCOUNTER — Other Ambulatory Visit: Payer: Self-pay

## 2022-12-30 ENCOUNTER — Ambulatory Visit (HOSPITAL_COMMUNITY): Payer: Medicare Other | Admitting: Anesthesiology

## 2022-12-30 ENCOUNTER — Encounter (HOSPITAL_COMMUNITY)
Admission: RE | Disposition: A | Discharge status: Home / Self Care | Payer: Self-pay | Source: Home / Self Care | Attending: Urology

## 2022-12-30 ENCOUNTER — Ambulatory Visit (HOSPITAL_BASED_OUTPATIENT_CLINIC_OR_DEPARTMENT_OTHER): Payer: Medicare Other | Admitting: Anesthesiology

## 2022-12-30 ENCOUNTER — Encounter (HOSPITAL_COMMUNITY): Payer: Self-pay | Admitting: Urology

## 2022-12-30 DIAGNOSIS — N401 Enlarged prostate with lower urinary tract symptoms: Principal | ICD-10-CM | POA: Insufficient documentation

## 2022-12-30 DIAGNOSIS — Z87891 Personal history of nicotine dependence: Secondary | ICD-10-CM | POA: Insufficient documentation

## 2022-12-30 DIAGNOSIS — R339 Retention of urine, unspecified: Secondary | ICD-10-CM | POA: Diagnosis not present

## 2022-12-30 DIAGNOSIS — N138 Other obstructive and reflux uropathy: Secondary | ICD-10-CM

## 2022-12-30 DIAGNOSIS — Z955 Presence of coronary angioplasty implant and graft: Secondary | ICD-10-CM | POA: Insufficient documentation

## 2022-12-30 DIAGNOSIS — I10 Essential (primary) hypertension: Secondary | ICD-10-CM | POA: Insufficient documentation

## 2022-12-30 DIAGNOSIS — I4891 Unspecified atrial fibrillation: Secondary | ICD-10-CM | POA: Diagnosis not present

## 2022-12-30 DIAGNOSIS — R399 Unspecified symptoms and signs involving the genitourinary system: Secondary | ICD-10-CM | POA: Diagnosis not present

## 2022-12-30 DIAGNOSIS — Z85828 Personal history of other malignant neoplasm of skin: Secondary | ICD-10-CM | POA: Diagnosis not present

## 2022-12-30 DIAGNOSIS — I2510 Atherosclerotic heart disease of native coronary artery without angina pectoris: Secondary | ICD-10-CM | POA: Insufficient documentation

## 2022-12-30 DIAGNOSIS — N400 Enlarged prostate without lower urinary tract symptoms: Secondary | ICD-10-CM | POA: Diagnosis present

## 2022-12-30 DIAGNOSIS — E119 Type 2 diabetes mellitus without complications: Principal | ICD-10-CM | POA: Insufficient documentation

## 2022-12-30 DIAGNOSIS — Z951 Presence of aortocoronary bypass graft: Secondary | ICD-10-CM | POA: Insufficient documentation

## 2022-12-30 HISTORY — PX: TRANSURETHRAL RESECTION OF PROSTATE: SHX73

## 2022-12-30 HISTORY — PX: CYSTOSCOPY: SHX5120

## 2022-12-30 LAB — CBC
HCT: 42.8 % (ref 39.0–52.0)
Hemoglobin: 14 g/dL (ref 13.0–17.0)
MCH: 31.7 pg (ref 26.0–34.0)
MCHC: 32.7 g/dL (ref 30.0–36.0)
MCV: 96.8 fL (ref 80.0–100.0)
Platelets: 140 10*3/uL — ABNORMAL LOW (ref 150–400)
RBC: 4.42 MIL/uL (ref 4.22–5.81)
RDW: 12.5 % (ref 11.5–15.5)
WBC: 9.3 10*3/uL (ref 4.0–10.5)
nRBC: 0 % (ref 0.0–0.2)

## 2022-12-30 LAB — BASIC METABOLIC PANEL
Anion gap: 11 (ref 5–15)
BUN: 17 mg/dL (ref 8–23)
CO2: 26 mmol/L (ref 22–32)
Calcium: 8.9 mg/dL (ref 8.9–10.3)
Chloride: 105 mmol/L (ref 98–111)
Creatinine, Ser: 0.8 mg/dL (ref 0.61–1.24)
GFR, Estimated: >60 mL/min (ref >60)
Glucose, Bld: 144 mg/dL — ABNORMAL HIGH (ref 70–99)
Potassium: 4.4 mmol/L (ref 3.5–5.1)
Sodium: 142 mmol/L (ref 135–145)

## 2022-12-30 LAB — GLUCOSE, CAPILLARY
Glucose-Capillary: 124 mg/dL — ABNORMAL HIGH (ref 70–99)
Glucose-Capillary: 170 mg/dL — ABNORMAL HIGH (ref 70–99)
Glucose-Capillary: 172 mg/dL — ABNORMAL HIGH (ref 70–99)

## 2022-12-30 SURGERY — CYSTOSCOPY
Anesthesia: General | Site: Prostate

## 2022-12-30 MED ORDER — CHLORHEXIDINE GLUCONATE 0.12 % MT SOLN
15.0000 mL | Freq: Once | OROMUCOSAL | Status: AC
Start: 2022-12-30 — End: 2022-12-30

## 2022-12-30 MED ORDER — SODIUM CHLORIDE 0.9 % IV SOLN: INTRAVENOUS | Status: DC

## 2022-12-30 MED ORDER — SUCCINYLCHOLINE CHLORIDE 200 MG/10ML IV SOSY
PREFILLED_SYRINGE | INTRAVENOUS | Status: DC | PRN
  Administered 2022-12-30: 120 mg via INTRAVENOUS

## 2022-12-30 MED ORDER — FENTANYL CITRATE (PF) 250 MCG/5ML IJ SOLN
INTRAMUSCULAR | Status: DC | PRN
  Administered 2022-12-30: 100 ug via INTRAVENOUS

## 2022-12-30 MED ORDER — ONDANSETRON HCL 4 MG/2ML IJ SOLN
INTRAMUSCULAR | Status: AC
Start: 2022-12-30 — End: ?
  Filled 2022-12-30: qty 2

## 2022-12-30 MED ORDER — SPIRONOLACTONE 12.5 MG HALF TABLET
12.5000 mg | ORAL_TABLET | Freq: Every day | ORAL | Status: DC
  Administered 2022-12-30 – 2022-12-31 (×2): 12.5 mg via ORAL
  Filled 2022-12-30 (×2): qty 1

## 2022-12-30 MED ORDER — INSULIN ASPART 100 UNIT/ML IJ SOLN
0.0000 [IU] | Freq: Three times a day (TID) | INTRAMUSCULAR | Status: DC
  Administered 2022-12-30: 3 [IU] via SUBCUTANEOUS
  Administered 2022-12-31: 2 [IU] via SUBCUTANEOUS

## 2022-12-30 MED ORDER — ORAL CARE MOUTH RINSE
15.0000 mL | Freq: Once | OROMUCOSAL | Status: AC
Start: 2022-12-30 — End: 2022-12-30

## 2022-12-30 MED ORDER — ISOSORBIDE MONONITRATE ER 30 MG PO TB24
30.0000 mg | ORAL_TABLET | Freq: Every day | ORAL | Status: DC
  Administered 2022-12-31: 30 mg via ORAL
  Filled 2022-12-30: qty 1

## 2022-12-30 MED ORDER — STERILE WATER FOR IRRIGATION IR SOLN
Status: DC | PRN
  Administered 2022-12-30: 500 mL

## 2022-12-30 MED ORDER — PROPOFOL 10 MG/ML IV BOLUS
INTRAVENOUS | Status: AC
  Filled 2022-12-30: qty 20

## 2022-12-30 MED ORDER — LISINOPRIL 10 MG PO TABS
40.0000 mg | ORAL_TABLET | Freq: Every day | ORAL | Status: DC
  Administered 2022-12-31: 40 mg via ORAL
  Filled 2022-12-30: qty 4

## 2022-12-30 MED ORDER — FENTANYL CITRATE (PF) 100 MCG/2ML IJ SOLN
INTRAMUSCULAR | Status: AC
  Filled 2022-12-30: qty 2

## 2022-12-30 MED ORDER — ONDANSETRON HCL 4 MG/2ML IJ SOLN: 4.0000 mg | INTRAMUSCULAR | Status: DC | PRN

## 2022-12-30 MED ORDER — DEXTROSE-SODIUM CHLORIDE 5-0.45 % IV SOLN: INTRAVENOUS | Status: DC

## 2022-12-30 MED ORDER — SODIUM CHLORIDE 0.9 % IR SOLN
Status: DC | PRN
  Administered 2022-12-30 (×5): 3000 mL via INTRAVESICAL

## 2022-12-30 MED ORDER — OXYCODONE HCL 5 MG PO TABS: 5.0000 mg | ORAL_TABLET | Freq: Once | ORAL | Status: DC | PRN

## 2022-12-30 MED ORDER — SODIUM CHLORIDE 0.9 % IV SOLN
2.0000 g | INTRAVENOUS | Status: DC
  Administered 2022-12-31: 2 g via INTRAVENOUS
  Filled 2022-12-30: qty 20

## 2022-12-30 MED ORDER — BRINZOLAMIDE 1 % OP SUSP
1.0000 [drp] | Freq: Three times a day (TID) | OPHTHALMIC | Status: DC
  Administered 2022-12-31: 1 [drp] via OPHTHALMIC
  Filled 2022-12-30: qty 10

## 2022-12-30 MED ORDER — GLYCOPYRROLATE PF 0.2 MG/ML IJ SOSY
PREFILLED_SYRINGE | INTRAMUSCULAR | Status: DC | PRN
  Administered 2022-12-30: .2 mg via INTRAVENOUS

## 2022-12-30 MED ORDER — CARVEDILOL 12.5 MG PO TABS
25.0000 mg | ORAL_TABLET | Freq: Two times a day (BID) | ORAL | Status: DC
  Administered 2022-12-30 – 2022-12-31 (×2): 25 mg via ORAL
  Filled 2022-12-30: qty 8
  Filled 2022-12-30: qty 2

## 2022-12-30 MED ORDER — FENTANYL CITRATE PF 50 MCG/ML IJ SOSY: 25.0000 ug | PREFILLED_SYRINGE | INTRAMUSCULAR | Status: DC | PRN

## 2022-12-30 MED ORDER — GLYCOPYRROLATE PF 0.2 MG/ML IJ SOSY
PREFILLED_SYRINGE | INTRAMUSCULAR | Status: AC
  Filled 2022-12-30: qty 1

## 2022-12-30 MED ORDER — SODIUM CHLORIDE 0.9 % IV SOLN
2.0000 g | INTRAVENOUS | Status: AC
  Administered 2022-12-30: 2 g via INTRAVENOUS

## 2022-12-30 MED ORDER — HYOSCYAMINE SULFATE 0.125 MG SL SUBL
0.1250 mg | SUBLINGUAL_TABLET | SUBLINGUAL | Status: DC | PRN
  Administered 2022-12-31: 0.125 mg via SUBLINGUAL
  Filled 2022-12-30: qty 1

## 2022-12-30 MED ORDER — ZOLPIDEM TARTRATE 5 MG PO TABS: 5.0000 mg | ORAL_TABLET | Freq: Every evening | ORAL | Status: DC | PRN

## 2022-12-30 MED ORDER — DIPHENHYDRAMINE HCL 50 MG/ML IJ SOLN: 12.5000 mg | Freq: Four times a day (QID) | INTRAMUSCULAR | Status: DC | PRN

## 2022-12-30 MED ORDER — LATANOPROST 0.005 % OP SOLN
1.0000 [drp] | Freq: Every day | OPHTHALMIC | Status: DC
  Filled 2022-12-30: qty 2.5

## 2022-12-30 MED ORDER — LIDOCAINE HCL (PF) 2 % IJ SOLN
INTRAMUSCULAR | Status: DC | PRN
  Administered 2022-12-30: 100 mg via INTRADERMAL

## 2022-12-30 MED ORDER — CHLORHEXIDINE GLUCONATE 0.12 % MT SOLN
OROMUCOSAL | Status: AC
  Administered 2022-12-30: 15 mL via OROMUCOSAL
  Filled 2022-12-30: qty 15

## 2022-12-30 MED ORDER — FENTANYL CITRATE PF 50 MCG/ML IJ SOSY
25.0000 ug | PREFILLED_SYRINGE | INTRAMUSCULAR | Status: DC | PRN
  Administered 2022-12-30 (×2): 50 ug via INTRAVENOUS
  Filled 2022-12-30 (×2): qty 1

## 2022-12-30 MED ORDER — ATORVASTATIN CALCIUM 40 MG PO TABS
80.0000 mg | ORAL_TABLET | Freq: Every day | ORAL | Status: DC
  Administered 2022-12-30: 80 mg via ORAL
  Filled 2022-12-30: qty 2

## 2022-12-30 MED ORDER — LIDOCAINE 2% (20 MG/ML) 5 ML SYRINGE: INTRAMUSCULAR | Status: DC | PRN

## 2022-12-30 MED ORDER — ASPIRIN 81 MG PO TBEC
81.0000 mg | DELAYED_RELEASE_TABLET | Freq: Every morning | ORAL | Status: DC
  Administered 2022-12-31: 81 mg via ORAL
  Filled 2022-12-30: qty 1

## 2022-12-30 MED ORDER — ONDANSETRON HCL 4 MG/2ML IJ SOLN: 4.0000 mg | Freq: Once | INTRAMUSCULAR | Status: DC | PRN

## 2022-12-30 MED ORDER — ROCURONIUM BROMIDE 10 MG/ML (PF) SYRINGE
PREFILLED_SYRINGE | INTRAVENOUS | Status: DC | PRN
  Administered 2022-12-30: 50 mg via INTRAVENOUS

## 2022-12-30 MED ORDER — INSULIN ASPART 100 UNIT/ML IJ SOLN: 0.0000 [IU] | Freq: Every day | INTRAMUSCULAR | Status: DC

## 2022-12-30 MED ORDER — LACTATED RINGERS IV SOLN
INTRAVENOUS | Status: DC
Start: 2022-12-30 — End: 2022-12-30

## 2022-12-30 MED ORDER — DIPHENHYDRAMINE HCL 12.5 MG/5ML PO ELIX: 12.5000 mg | ORAL_SOLUTION | Freq: Four times a day (QID) | ORAL | Status: DC | PRN

## 2022-12-30 MED ORDER — FLUTICASONE PROPIONATE 50 MCG/ACT NA SUSP: 1.0000 | Freq: Every day | NASAL | Status: DC | PRN

## 2022-12-30 MED ORDER — WATER FOR IRRIGATION, STERILE IR SOLN: Status: DC | PRN

## 2022-12-30 MED ORDER — OXYCODONE HCL 5 MG/5ML PO SOLN: 5.0000 mg | Freq: Once | ORAL | Status: DC | PRN

## 2022-12-30 MED ORDER — ONDANSETRON HCL 4 MG/2ML IJ SOLN
INTRAMUSCULAR | Status: DC | PRN
  Administered 2022-12-30: 4 mg via INTRAVENOUS

## 2022-12-30 MED ORDER — PROPOFOL 10 MG/ML IV BOLUS
INTRAVENOUS | Status: DC | PRN
  Administered 2022-12-30: 100 mg via INTRAVENOUS

## 2022-12-30 MED ORDER — ACETAMINOPHEN 325 MG PO TABS
650.0000 mg | ORAL_TABLET | ORAL | Status: DC | PRN
Start: 2022-12-30 — End: 2022-12-31
  Administered 2022-12-30: 650 mg via ORAL
  Filled 2022-12-30: qty 2

## 2022-12-30 MED ORDER — LIDOCAINE HCL (PF) 2 % IJ SOLN
INTRAMUSCULAR | Status: AC
  Filled 2022-12-30: qty 5

## 2022-12-30 MED ORDER — SODIUM CHLORIDE 0.9 % IV SOLN
INTRAVENOUS | Status: AC
  Filled 2022-12-30: qty 20

## 2022-12-30 MED ORDER — HYDROCODONE-ACETAMINOPHEN 5-325 MG PO TABS
1.0000 | ORAL_TABLET | ORAL | Status: DC | PRN
  Administered 2022-12-31: 1 via ORAL
  Filled 2022-12-30: qty 1

## 2022-12-30 MED ORDER — SODIUM CHLORIDE 0.9 % IR SOLN
3000.0000 mL | Status: DC
  Administered 2022-12-30: 3000 mL

## 2022-12-30 MED ORDER — SENNOSIDES-DOCUSATE SODIUM 8.6-50 MG PO TABS
2.0000 | ORAL_TABLET | Freq: Every day | ORAL | Status: DC
  Administered 2022-12-30: 2 via ORAL
  Filled 2022-12-30: qty 2

## 2022-12-30 MED ORDER — SUGAMMADEX SODIUM 200 MG/2ML IV SOLN
INTRAVENOUS | Status: DC | PRN
  Administered 2022-12-30: 200 mg via INTRAVENOUS

## 2022-12-30 SURGICAL SUPPLY — 23 items
BAG DRAIN URO TABLE W/ADPT NS (BAG) ×2 IMPLANT
BAG HAMPER (MISCELLANEOUS) ×2 IMPLANT
BAG URINE DRAIN TURP 4L (OSTOMY) ×2 IMPLANT
CATH FOLEY 3WAY 30CC 22F (CATHETERS) ×2 IMPLANT
CLOTH BEACON ORANGE TIMEOUT ST (SAFETY) ×2 IMPLANT
ELECT REM PT RETURN 9FT ADLT (ELECTROSURGICAL) ×2
ELECTRODE REM PT RTRN 9FT ADLT (ELECTROSURGICAL) ×2 IMPLANT
GLOVE BIO SURGEON STRL SZ8 (GLOVE) ×2 IMPLANT
GLOVE BIOGEL PI IND STRL 7.0 (GLOVE) ×4 IMPLANT
GOWN STRL REUS W/TWL LRG LVL3 (GOWN DISPOSABLE) ×4 IMPLANT
GOWN STRL REUS W/TWL XL LVL3 (GOWN DISPOSABLE) ×2 IMPLANT
IV NS IRRIG 3000ML ARTHROMATIC (IV SOLUTION) ×8 IMPLANT
KIT TURNOVER CYSTO (KITS) ×2 IMPLANT
LOOP CUT BIPOLAR 24F LRG (ELECTROSURGICAL) ×2 IMPLANT
MANIFOLD NEPTUNE II (INSTRUMENTS) ×2 IMPLANT
PACK CYSTO (CUSTOM PROCEDURE TRAY) ×2 IMPLANT
PAD ARMBOARD 7.5X6 YLW CONV (MISCELLANEOUS) ×2 IMPLANT
POSITIONER HEAD 8X9X4 ADT (SOFTGOODS) ×2 IMPLANT
SYR 30ML LL (SYRINGE) ×2 IMPLANT
SYR TOOMEY IRRIG 70ML (MISCELLANEOUS) ×2
SYRINGE TOOMEY IRRIG 70ML (MISCELLANEOUS) ×2 IMPLANT
TOWEL OR 17X26 4PK STRL BLUE (TOWEL DISPOSABLE) ×2 IMPLANT
WATER STERILE IRR 500ML POUR (IV SOLUTION) ×2 IMPLANT

## 2022-12-30 NOTE — Transfer of Care (Signed)
Immediate Anesthesia Transfer of Care Note  Patient: Sean Villarreal  Procedure(s) Performed: CYSTOSCOPY (Bladder) TRANSURETHRAL RESECTION OF THE PROSTATE (TURP) (Prostate)  Patient Location: PACU  Anesthesia Type:General  Level of Consciousness: awake and alert   Airway & Oxygen Therapy: Patient Spontanous Breathing and Patient connected to face mask oxygen  Post-op Assessment: Report given to RN and Post -op Vital signs reviewed and stable  Post vital signs: Reviewed and stable  Last Vitals:  Vitals Value Taken Time  BP 146/89   Temp 98.3   Pulse 97 12/30/22 1216  Resp 29 12/30/22 1216  SpO2 99 % 12/30/22 1216  Vitals shown include unfiled device data.  Last Pain:  Vitals:   12/30/22 1036  PainSc: 0-No pain         Complications: No notable events documented.

## 2022-12-30 NOTE — Anesthesia Preprocedure Evaluation (Signed)
Anesthesia Evaluation  Patient identified by MRN, date of birth, ID band Patient awake    Reviewed: Allergy & Precautions, H&P , NPO status , Patient's Chart, lab work & pertinent test results, reviewed documented beta blocker date and time   Airway Mallampati: II  TM Distance: >3 FB Neck ROM: full    Dental no notable dental hx.    Pulmonary neg pulmonary ROS, shortness of breath, former smoker   Pulmonary exam normal breath sounds clear to auscultation       Cardiovascular Exercise Tolerance: Good hypertension, + angina  + CAD and + Past MI  negative cardio ROS  Rhythm:regular Rate:Normal     Neuro/Psych negative neurological ROS  negative psych ROS   GI/Hepatic negative GI ROS, Neg liver ROS,GERD  ,,  Endo/Other  negative endocrine ROSdiabetes    Renal/GU Renal diseasenegative Renal ROS  negative genitourinary   Musculoskeletal   Abdominal   Peds  Hematology negative hematology ROS (+)   Anesthesia Other Findings   Reproductive/Obstetrics negative OB ROS                             Anesthesia Physical Anesthesia Plan  ASA: 2  Anesthesia Plan: General and General LMA   Post-op Pain Management:    Induction:   PONV Risk Score and Plan: Ondansetron  Airway Management Planned:   Additional Equipment:   Intra-op Plan:   Post-operative Plan:   Informed Consent: I have reviewed the patients History and Physical, chart, labs and discussed the procedure including the risks, benefits and alternatives for the proposed anesthesia with the patient or authorized representative who has indicated his/her understanding and acceptance.     Dental Advisory Given  Plan Discussed with: CRNA  Anesthesia Plan Comments:        Anesthesia Quick Evaluation

## 2022-12-30 NOTE — Plan of Care (Signed)
  Problem: Education: Goal: Knowledge of General Education information will improve Description: Including pain rating scale, medication(s)/side effects and non-pharmacologic comfort measures Outcome: Progressing   Problem: Health Behavior/Discharge Planning: Goal: Ability to manage health-related needs will improve Outcome: Progressing   Problem: Clinical Measurements: Goal: Ability to maintain clinical measurements within normal limits will improve Outcome: Progressing Goal: Will remain free from infection Outcome: Progressing Goal: Diagnostic test results will improve Outcome: Progressing Goal: Respiratory complications will improve Outcome: Progressing Goal: Cardiovascular complication will be avoided Outcome: Progressing   Problem: Activity: Goal: Risk for activity intolerance will decrease Outcome: Progressing   Problem: Nutrition: Goal: Adequate nutrition will be maintained Outcome: Progressing   Problem: Coping: Goal: Level of anxiety will decrease Outcome: Progressing   Problem: Elimination: Goal: Will not experience complications related to bowel motility Outcome: Progressing Goal: Will not experience complications related to urinary retention Outcome: Progressing   Problem: Pain Management: Goal: General experience of comfort will improve Outcome: Progressing   Problem: Safety: Goal: Ability to remain free from injury will improve Outcome: Progressing   Problem: Skin Integrity: Goal: Risk for impaired skin integrity will decrease Outcome: Progressing   Problem: Education: Goal: Ability to describe self-care measures that may prevent or decrease complications (Diabetes Survival Skills Education) will improve Outcome: Progressing Goal: Individualized Educational Video(s) Outcome: Progressing   Problem: Coping: Goal: Ability to adjust to condition or change in health will improve Outcome: Progressing   Problem: Fluid Volume: Goal: Ability to  maintain a balanced intake and output will improve Outcome: Progressing   Problem: Health Behavior/Discharge Planning: Goal: Ability to identify and utilize available resources and services will improve Outcome: Progressing Goal: Ability to manage health-related needs will improve Outcome: Progressing   Problem: Metabolic: Goal: Ability to maintain appropriate glucose levels will improve Outcome: Progressing   Problem: Nutritional: Goal: Maintenance of adequate nutrition will improve Outcome: Progressing Goal: Progress toward achieving an optimal weight will improve Outcome: Progressing   Problem: Skin Integrity: Goal: Risk for impaired skin integrity will decrease Outcome: Progressing   Problem: Tissue Perfusion: Goal: Adequacy of tissue perfusion will improve Outcome: Progressing   Problem: Education: Goal: Knowledge of the prescribed therapeutic regimen will improve Outcome: Progressing   Problem: Bowel/Gastric: Goal: Gastrointestinal status for postoperative course will improve Outcome: Progressing   Problem: Health Behavior/Discharge Planning: Goal: Identification of resources available to assist in meeting health care needs will improve Outcome: Progressing   Problem: Skin Integrity: Goal: Demonstration of wound healing without infection will improve Outcome: Progressing   Problem: Urinary Elimination: Goal: Ability to avoid or minimize complications of infection will improve Outcome: Progressing

## 2022-12-30 NOTE — Anesthesia Procedure Notes (Signed)
Procedure Name: Intubation Date/Time: 12/30/2022 11:35 AM  Performed by: Izola Price., CRNAPre-anesthesia Checklist: Patient identified, Emergency Drugs available, Suction available and Patient being monitored Patient Re-evaluated:Patient Re-evaluated prior to induction Oxygen Delivery Method: Circle system utilized Preoxygenation: Pre-oxygenation with 100% oxygen Induction Type: IV induction Ventilation: Mask ventilation without difficulty Laryngoscope Size: Mac and 4 Grade View: Grade I Tube type: Oral Tube size: 7.5 mm Number of attempts: 1 Airway Equipment and Method: Stylet and Oral airway Placement Confirmation: ETT inserted through vocal cords under direct vision, positive ETCO2 and breath sounds checked- equal and bilateral Tube secured with: Tape Dental Injury: Teeth and Oropharynx as per pre-operative assessment  Comments: Procedure performed by EMT Student

## 2022-12-30 NOTE — H&P (Signed)
Urology Admission H&P  Chief Complaint: urinary retention  History of Present Illness: Sean Villarreal is a 82yo here for TURP for BPH. He is currently managed with an indwelling foley. No other complaints today  Past Medical History:  Diagnosis Date   Aortic stenosis    a. 03/2022 Echo: EF 60-65%, no rwma, mild LVH, nl RV fxn, mild-mod AS.   Arthritis    Basal cell carcinoma    Coronary artery disease    a. DES to LAD and LCx April 2014 b. PTCA ostial circumflex 03/2016 due to ISR c.  9/18 PCI/DESx2 osital Lcx (ISR), p/mLCx  d. s/p CABG on 01/24/2020 with LIMA-LAD and SVG-OM.   Essential hypertension    GERD (gastroesophageal reflux disease)    Hematuria    History of blood transfusion 09/2012   History of kidney stones    Hyperlipidemia LDL goal <70    NSTEMI (non-ST elevated myocardial infarction) (HCC) 08/2012   Post-op Afib (HCC)    a. 12/2019-01/2020 following CABG. Briefly on amio.   Type II diabetes mellitus (HCC)    Past Surgical History:  Procedure Laterality Date   BASAL CELL CARCINOMA EXCISION     "right cheek; both shoulders"   CARDIAC CATHETERIZATION     CATARACT EXTRACTION W/ INTRAOCULAR LENS  IMPLANT, BILATERAL Bilateral    COLONOSCOPY N/A 03/19/2016   Procedure: COLONOSCOPY;  Surgeon: Malissa Hippo, MD;  Location: [facility];  Service: Endoscopy;  Laterality: N/A;  9:15   CORONARY ARTERY BYPASS GRAFT N/A 01/24/2020   Procedure: CORONARY ARTERY BYPASS GRAFTING (CABG), ON PUMP, TIMES TWO, USING LEFT INTERNAL MAMMARY ARTERY AND ENDOSCOPICALLY HARVESTED RIGHT GREATER SAPHENOUS VEIN;  Surgeon: Loreli Slot, MD;  Location: [facility] OR;  Service: Open Heart Surgery;  Laterality: N/A;   CORONARY BALLOON ANGIOPLASTY N/A 04/15/2016   Procedure: Coronary Balloon Angioplasty;  Surgeon: Peter M Swaziland, MD;  Location: [facility] INVASIVE CV LAB;  Service: Cardiovascular;  Laterality: N/A;   CORONARY STENT INTERVENTION N/A 10/11/2016   Procedure: CORONARY STENT INTERVENTION;  Surgeon:  Corky Crafts, MD;  Location: [facility] INVASIVE CV LAB;  Service: Cardiovascular;  Laterality: N/A;   CYSTOSCOPY W/ URETEROSCOPY W/ LITHOTRIPSY  10/2012   /notes 11/10/2012   CYSTOSCOPY WITH STENT PLACEMENT  08/2012; 09/2012   EYE SURGERY Left ~ 02/2016   "for crinkled lens"   INGUINAL HERNIA REPAIR Left 12/24/2020   Procedure: HERNIA REPAIR INGUINAL WITH MESH;  Surgeon: Lucretia Roers, MD;  Location: [facility] ORS;  Service: General;  Laterality: Left;   LEFT HEART CATH AND CORONARY ANGIOGRAPHY N/A 04/15/2016   Procedure: Left Heart Cath and Coronary Angiography;  Surgeon: Peter M Swaziland, MD;  Location: [facility] INVASIVE CV LAB;  Service: Cardiovascular;  Laterality: N/A;   LEFT HEART CATH AND CORONARY ANGIOGRAPHY N/A 10/11/2016   Procedure: LEFT HEART CATH AND CORONARY ANGIOGRAPHY;  Surgeon: Corky Crafts, MD;  Location: [facility] INVASIVE CV LAB;  Service: Cardiovascular;  Laterality: N/A;   LEFT HEART CATH AND CORONARY ANGIOGRAPHY N/A 03/31/2018   Procedure: LEFT HEART CATH AND CORONARY ANGIOGRAPHY;  Surgeon: Lennette Bihari, MD;  Location: [facility] INVASIVE CV LAB;  Service: Cardiovascular;  Laterality: N/A;   LEFT HEART CATH AND CORONARY ANGIOGRAPHY N/A 01/21/2020   Procedure: LEFT HEART CATH AND CORONARY ANGIOGRAPHY;  Surgeon: Lyn Records, MD;  Location: [facility] INVASIVE CV LAB;  Service: Cardiovascular;  Laterality: N/A;   LEFT HEART CATHETERIZATION WITH CORONARY ANGIOGRAM N/A 05/05/2012   Procedure: LEFT HEART CATHETERIZATION WITH CORONARY ANGIOGRAM;  Surgeon:  Kathleene Hazel, MD;  Location: [facility] CATH LAB;  Service: Cardiovascular;  Laterality: N/A;   LEFT HEART CATHETERIZATION WITH CORONARY ANGIOGRAM N/A 05/15/2012   Procedure: LEFT HEART CATHETERIZATION WITH CORONARY ANGIOGRAM;  Surgeon: Kathleene Hazel, MD;  Location: [facility] CATH LAB;  Service: Cardiovascular;  Laterality: N/A;   TEE WITHOUT CARDIOVERSION N/A 01/24/2020   Procedure: TRANSESOPHAGEAL ECHOCARDIOGRAM (TEE);  Surgeon: Loreli Slot, MD;  Location: [facility] OR;  Service: Open Heart Surgery;  Laterality: N/A;   TONSILLECTOMY  1947    Home Medications:  Current Facility-Administered Medications  Medication Dose Route Frequency Provider Last Rate Last Admin   0.9 %  sodium chloride infusion   Intravenous Continuous Windell Norfolk, MD 40 mL/hr at 12/30/22 1055 New Bag at 12/30/22 1055   cefTRIAXone (ROCEPHIN) 2 g in sodium chloride 0.9 % 100 mL IVPB  2 g Intravenous 30 min Pre-Op Avantika Shere, Mardene Celeste, MD       sodium chloride 0.9 % with cefTRIAXone (ROCEPHIN) ADS Med            Allergies:  Allergies  Allergen Reactions   Contrast Media [Iodinated Contrast Media] Hives and Other (See Comments)    Had "welts" on arm, and kidney issues after given dye in the past   Penicillins Other (See Comments)    Passed out as a child Has patient had a PCN reaction causing immediate rash, facial/tongue/throat swelling, SOB or lightheadedness with hypotension: Unk Has patient had a PCN reaction causing severe rash involving mucus membranes or skin necrosis: Unk Has patient had a PCN reaction that required hospitalization: Unk Has patient had a PCN reaction occurring within the last 10 years: No If all of the above answers are "NO", then may proceed with Cephalosporin use.    Jardiance [Empagliflozin] Diarrhea    Family History  Problem Relation Age of Onset   Heart disease Mother    Heart failure Mother    Heart disease Father    Heart attack Father    Colon cancer Neg Hx    Social History:  reports that he quit smoking about 34 years ago. His smoking use included cigarettes. He started smoking about 40 years ago. He has a 22.5 pack-year smoking history. He has never used smokeless tobacco. He reports that he does not currently use alcohol. He reports that he does not use drugs.  Review of Systems  Genitourinary:  Positive for difficulty urinating.  All other systems reviewed and are negative.   Physical Exam:  Vital signs in  last 24 hours: Temp:  [97.8 F (36.6 C)-98.4 F (36.9 C)] 98.4 F (36.9 C) (12/05 1037) Pulse Rate:  [75-81] 81 (12/05 1037) Resp:  [16-18] 16 (12/05 1037) BP: (127-132)/(78-82) 127/78 (12/05 1037) SpO2:  [98 %-99 %] 98 % (12/05 1037) Weight:  [91 kg] 91 kg (12/05 1036) Physical Exam Vitals reviewed.  Constitutional:      Appearance: Normal appearance.  HENT:     Head: Normocephalic and atraumatic.     Mouth/Throat:     Mouth: Mucous membranes are moist.  Eyes:     Extraocular Movements: Extraocular movements intact.     Pupils: Pupils are equal, round, and reactive to light.  Cardiovascular:     Rate and Rhythm: Normal rate and regular rhythm.  Pulmonary:     Effort: Pulmonary effort is normal. No respiratory distress.  Musculoskeletal:        General: Normal range of motion.     Cervical back: Normal range of  motion.  Skin:    General: Skin is warm and dry.  Neurological:     General: No focal deficit present.     Mental Status: He is alert and oriented to person, place, and time.  Psychiatric:        Mood and Affect: Mood normal.        Behavior: Behavior normal.        Thought Content: Thought content normal.        Judgment: Judgment normal.     Laboratory Data:  Results for orders placed or performed during the hospital encounter of 12/29/22 (from the past 24 hour(s))  Basic metabolic panel     Status: Abnormal   Collection Time: 12/29/22 11:27 AM  Result Value Ref Range   Sodium 139 135 - 145 mmol/L   Potassium 4.1 3.5 - 5.1 mmol/L   Chloride 103 98 - 111 mmol/L   CO2 25 22 - 32 mmol/L   Glucose, Bld 205 (H) 70 - 99 mg/dL   BUN 20 8 - 23 mg/dL   Creatinine, Ser 9.60 0.61 - 1.24 mg/dL   Calcium 9.4 8.9 - 45.4 mg/dL   GFR, Estimated >09 >81 mL/min   Anion gap 11 5 - 15  CBC with Differential/Platelet     Status: None   Collection Time: 12/29/22 11:27 AM  Result Value Ref Range   WBC 9.2 4.0 - 10.5 K/uL   RBC 4.45 4.22 - 5.81 MIL/uL   Hemoglobin 14.1  13.0 - 17.0 g/dL   HCT 19.1 47.8 - 29.5 %   MCV 96.6 80.0 - 100.0 fL   MCH 31.7 26.0 - 34.0 pg   MCHC 32.8 30.0 - 36.0 g/dL   RDW 62.1 30.8 - 65.7 %   Platelets 168 150 - 400 K/uL   nRBC 0.0 0.0 - 0.2 %   Neutrophils Relative % 68 %   Neutro Abs 6.1 1.7 - 7.7 K/uL   Lymphocytes Relative 21 %   Lymphs Abs 2.0 0.7 - 4.0 K/uL   Monocytes Relative 8 %   Monocytes Absolute 0.8 0.1 - 1.0 K/uL   Eosinophils Relative 3 %   Eosinophils Absolute 0.3 0.0 - 0.5 K/uL   Basophils Relative 0 %   Basophils Absolute 0.0 0.0 - 0.1 K/uL   Immature Granulocytes 0 %   Abs Immature Granulocytes 0.03 0.00 - 0.07 K/uL  Hemoglobin A1c     Status: Abnormal   Collection Time: 12/29/22 11:27 AM  Result Value Ref Range   Hgb A1c MFr Bld 7.6 (H) 4.8 - 5.6 %   Mean Plasma Glucose 171.42 mg/dL   No results found for this or any previous visit (from the past 240 hour(s)). Creatinine: Recent Labs    12/29/22 1127  CREATININE 0.88   Baseline Creatinine: 0.9  Impression/Assessment:  82yo with BPH and urinary retention  Plan:  We discussed the management of his BPH including continued medical therapy, Rezum, Urolift, TURP and simple prostatectomy. After discussing the options the patient has elected to proceed with TURP. Risks/benefits/alternatives discussed.   Sean Villarreal 12/30/2022, 11:04 AM

## 2022-12-30 NOTE — Op Note (Signed)
Preoperative diagnosis: BPH with urinary retention  Postoperative diagnosis: BPH  Procedure: 1 cystoscopy 2. Transurethral resection of the prostate  Attending: Wilkie Aye  Anesthesia: General  Estimated blood loss: Minimal  Drains: 22 French foley  Specimens: 1. Prostate Chips  Antibiotics: Rocephin  Findings: bilobar prostate enlargement. Ureteral orifices in normal anatomic location.   Indications: Patient is a 82 year old male with a history of BPH and urinary retention currently managed with a chronic foley catheter.  After discussing treatment options, they decided proceed with transurethral resection of the prostate.  Procedure in detail: The patient was brought to the operating room and a brief timeout was done to ensure correct patient, correct procedure, correct site.  General anesthesia was administered patient was placed in dorsal lithotomy position.  Their genitalia was then prepped and draped in usual sterile fashion.  A rigid 22 French cystoscope was passed in the urethra and the bladder.  Bladder was inspected and we noted no masses or lesions.  the ureteral orifices were in the normal orthotopic locations. removed the cystoscope and placed a resectoscope into the bladder. We then turned our attention to the prostate resection. Using the bipolar resectoscope we resected lateral lobes. We tarted at the 12 oclock position on the left lobe and resection to the 6 o'clock position from the bladder neck to the verumontanum. We then did the same resection of the right lobe. Once the resection was complete we then cauterized individual bleeders. We then removed the prostate chips and sent them for pathology.  We then re-inspected the prostatic fossa and found no residual bleeding.  the bladder was then drained, a 22 French foley was placed and this concluded the procedure which was well tolerated by patient.  Complications: None  Condition: Stable, extubated, transferred to  PACU  Plan: Patient is admitted overnight with continuous bladder irrigation. If their urine is clear tomorrow they will be discharged home and followup in 5 days for foley catheter removal and pathology discussion.

## 2022-12-31 DIAGNOSIS — I2510 Atherosclerotic heart disease of native coronary artery without angina pectoris: Secondary | ICD-10-CM | POA: Diagnosis not present

## 2022-12-31 DIAGNOSIS — I10 Essential (primary) hypertension: Secondary | ICD-10-CM | POA: Diagnosis not present

## 2022-12-31 DIAGNOSIS — E119 Type 2 diabetes mellitus without complications: Secondary | ICD-10-CM | POA: Diagnosis not present

## 2022-12-31 DIAGNOSIS — N401 Enlarged prostate with lower urinary tract symptoms: Secondary | ICD-10-CM | POA: Diagnosis not present

## 2022-12-31 DIAGNOSIS — Z85828 Personal history of other malignant neoplasm of skin: Secondary | ICD-10-CM | POA: Diagnosis not present

## 2022-12-31 DIAGNOSIS — R339 Retention of urine, unspecified: Secondary | ICD-10-CM | POA: Diagnosis not present

## 2022-12-31 LAB — CBC
HCT: 40.7 % (ref 39.0–52.0)
Hemoglobin: 13.7 g/dL (ref 13.0–17.0)
MCH: 31.7 pg (ref 26.0–34.0)
MCHC: 33.7 g/dL (ref 30.0–36.0)
MCV: 94.2 fL (ref 80.0–100.0)
Platelets: 138 10*3/uL — ABNORMAL LOW (ref 150–400)
RBC: 4.32 MIL/uL (ref 4.22–5.81)
RDW: 12.4 % (ref 11.5–15.5)
WBC: 11.7 10*3/uL — ABNORMAL HIGH (ref 4.0–10.5)
nRBC: 0 % (ref 0.0–0.2)

## 2022-12-31 LAB — BASIC METABOLIC PANEL
Anion gap: 9 (ref 5–15)
BUN: 15 mg/dL (ref 8–23)
CO2: 25 mmol/L (ref 22–32)
Calcium: 8.8 mg/dL — ABNORMAL LOW (ref 8.9–10.3)
Chloride: 105 mmol/L (ref 98–111)
Creatinine, Ser: 0.88 mg/dL (ref 0.61–1.24)
GFR, Estimated: >60 mL/min (ref >60)
Glucose, Bld: 156 mg/dL — ABNORMAL HIGH (ref 70–99)
Potassium: 3.6 mmol/L (ref 3.5–5.1)
Sodium: 139 mmol/L (ref 135–145)

## 2022-12-31 LAB — SURGICAL PATHOLOGY

## 2022-12-31 LAB — GLUCOSE, CAPILLARY: Glucose-Capillary: 142 mg/dL — ABNORMAL HIGH (ref 70–99)

## 2022-12-31 MED ORDER — TRAMADOL HCL 50 MG PO TABS: 50.0000 mg | ORAL_TABLET | Freq: Four times a day (QID) | ORAL | 0 refills | Status: DC | PRN

## 2022-12-31 MED ORDER — CHLORHEXIDINE GLUCONATE CLOTH 2 % EX PADS
6.0000 | MEDICATED_PAD | Freq: Every day | CUTANEOUS | Status: DC
  Administered 2022-12-31: 6 via TOPICAL

## 2022-12-31 NOTE — Progress Notes (Signed)
Nsg Discharge Note  Admit Date:  12/30/2022 Discharge date: 12/31/2022   Sean Villarreal to be D/C'd Home per MD order.  AVS completed.  Patient/caregiver able to verbalize understanding.  Discharge Medication: Allergies as of 12/31/2022       Reactions   Contrast Media [iodinated Contrast Media] Hives, Other (See Comments)   Had "welts" on arm, and kidney issues after given dye in the past   Penicillins Other (See Comments)   Passed out as a child Has patient had a PCN reaction causing immediate rash, facial/tongue/throat swelling, SOB or lightheadedness with hypotension: Unk Has patient had a PCN reaction causing severe rash involving mucus membranes or skin necrosis: Unk Has patient had a PCN reaction that required hospitalization: Unk Has patient had a PCN reaction occurring within the last 10 years: No If all of the above answers are "NO", then may proceed with Cephalosporin use.   Jardiance [empagliflozin] Diarrhea        Medication List     TAKE these medications    aspirin EC 81 MG tablet Take 81 mg by mouth in the morning. Swallow whole.   atorvastatin 80 MG tablet Commonly known as: LIPITOR Take 80 mg by mouth at bedtime.   carvedilol 25 MG tablet Commonly known as: COREG Take 1 tablet (25 mg total) by mouth 2 (two) times daily with a meal.   fluticasone 50 MCG/ACT nasal spray Commonly known as: FLONASE Place 1 spray into both nostrils daily as needed for allergies or rhinitis.   furosemide 20 MG tablet Commonly known as: LASIX Take 1 tablet (20 mg total) by mouth daily as needed (swelling).   glimepiride 1 MG tablet Commonly known as: AMARYL Take 1 mg by mouth daily with breakfast.   isosorbide mononitrate 30 MG 24 hr tablet Commonly known as: IMDUR Take 1 tablet (30 mg total) by mouth daily.   latanoprost 0.005 % ophthalmic solution Commonly known as: XALATAN Place 1 drop into the left eye at bedtime.   lisinopril 40 MG tablet Commonly known  as: ZESTRIL Take 40 mg by mouth daily.   Magnesium 400 MG Tabs Take 1 tablet by mouth daily.   metFORMIN 1000 MG tablet Commonly known as: GLUCOPHAGE Take 1,000 mg by mouth 2 (two) times daily with a meal.   mirabegron ER 25 MG Tb24 tablet Commonly known as: MYRBETRIQ Take 1 tablet (25 mg total) by mouth daily.   nitroGLYCERIN 0.4 MG SL tablet Commonly known as: NITROSTAT Place 0.4 mg under the tongue every 5 (five) minutes x 3 doses as needed for chest pain.   phenazopyridine 100 MG tablet Commonly known as: Pyridium Take 1 tablet (100 mg total) by mouth every 8 (eight) hours as needed.   Potassium Chloride ER 20 MEQ Tbcr TAKE 2 TABLETS BY MOUTH DAILY What changed:  how much to take when to take this reasons to take this   Simbrinza 1-0.2 % Susp Generic drug: Brinzolamide-Brimonidine Place 1 drop into the left eye 2 (two) times daily.   spironolactone 25 MG tablet Commonly known as: ALDACTONE Take 0.5 tablets (12.5 mg total) by mouth daily.   tamsulosin 0.4 MG Caps capsule Commonly known as: FLOMAX Take 0.4 mg by mouth 2 (two) times daily.   traMADol 50 MG tablet Commonly known as: Ultram Take 1 tablet (50 mg total) by mouth every 6 (six) hours as needed.   Trulicity 0.75 MG/0.5ML Soaj Generic drug: Dulaglutide Inject 0.75 mg into the skin once a week.  Discharge Assessment: Vitals:   12/30/22 2143 12/31/22 0132  BP: 138/74 (!) 140/73  Pulse: 76 81  Resp: 19 18  Temp: 98.3 F (36.8 C) 98.5 F (36.9 C)  SpO2: 98% 96%   Skin clean, dry and intact without evidence of skin break down, no evidence of skin tears noted. IV catheter discontinued intact. Site without signs and symptoms of complications - no redness or edema noted at insertion site, patient denies c/o pain - only slight tenderness at site.  Dressing with slight pressure applied.  D/c Instructions-Education: Discharge instructions given to patient/family with verbalized  understanding. D/c education completed with patient/family including follow up instructions, medication list, d/c activities limitations if indicated, with other d/c instructions as indicated by MD - patient able to verbalize understanding, all questions fully answered. Patient instructed to return to ED, call 911, or call MD for any changes in condition.  Patient escorted via WC, and D/C home via private auto.  Laurena Spies, RN 12/31/2022 10:00 AM

## 2022-12-31 NOTE — Progress Notes (Signed)
   12/31/22 0849  TOC Brief Assessment  Insurance and Status Reviewed  Patient has primary care physician Yes  Home environment has been reviewed from home  Prior level of function: independent  Prior/Current Home Services No current home services  Social Determinants of Health Reivew SDOH reviewed no interventions necessary  Readmission risk has been reviewed Yes  Transition of care needs no transition of care needs at this time    Transition of Care Department [facility]) has reviewed patient and no TOC needs have been identified at this time. We will continue to monitor patient advancement through interdisciplinary progression rounds. If new patient transition needs arise, please place a TOC consult.

## 2023-01-03 ENCOUNTER — Encounter (HOSPITAL_COMMUNITY): Payer: Self-pay | Admitting: Urology

## 2023-01-04 NOTE — Discharge Summary (Signed)
Physician Discharge Summary  Patient ID: Sean Villarreal MRN: [id number] DOB/AGE: 09-10-1940 82 y.o.  Admit date: 12/30/2022 Discharge date: 12/31/2022  Admission Diagnoses:  BPH (benign prostatic hyperplasia)  Discharge Diagnoses:  Principal Problem:   BPH (benign prostatic hyperplasia) Active Problems:   Benign prostatic hyperplasia with urinary obstruction   Past Medical History:  Diagnosis Date   Aortic stenosis    a. 03/2022 Echo: EF 60-65%, no rwma, mild LVH, nl RV fxn, mild-mod AS.   Arthritis    Basal cell carcinoma    Coronary artery disease    a. DES to LAD and LCx April 2014 b. PTCA ostial circumflex 03/2016 due to ISR c.  9/18 PCI/DESx2 osital Lcx (ISR), p/mLCx  d. s/p CABG on 01/24/2020 with LIMA-LAD and SVG-OM.   Essential hypertension    GERD (gastroesophageal reflux disease)    Hematuria    History of blood transfusion 09/2012   History of kidney stones    Hyperlipidemia LDL goal <70    NSTEMI (non-ST elevated myocardial infarction) (HCC) 08/2012   Post-op Afib (HCC)    a. 12/2019-01/2020 following CABG. Briefly on amio.   Type II diabetes mellitus (HCC)     Surgeries: Procedure(s): CYSTOSCOPY TRANSURETHRAL RESECTION OF THE PROSTATE (TURP) on 12/30/2022   Consultants (if any):   Discharged Condition: Improved  Hospital Course: Sean Villarreal is an 82 y.o. male who was admitted 12/30/2022 with a diagnosis of BPH (benign prostatic hyperplasia) and went to the operating room on 12/30/2022 and underwent the above named procedures.    He was given perioperative antibiotics:  Anti-infectives (From admission, onward)    Start     Dose/Rate Route Frequency Ordered Stop   12/31/22 0800  cefTRIAXone (ROCEPHIN) 2 g in sodium chloride 0.9 % 100 mL IVPB  Status:  Discontinued        2 g 200 mL/hr over 30 Minutes Intravenous Every 24 hours 12/30/22 1454 12/31/22 1532   12/30/22 0955  sodium chloride 0.9 % with cefTRIAXone (ROCEPHIN) ADS Med       Note to Pharmacy:  Laureen Ochs R: cabinet override      12/30/22 0955 12/30/22 1142   12/30/22 0952  cefTRIAXone (ROCEPHIN) 2 g in sodium chloride 0.9 % 100 mL IVPB        2 g 200 mL/hr over 30 Minutes Intravenous 30 min pre-op 12/30/22 0952 12/30/22 1629     .  He was given sequential compression devices, early ambulation for DVT prophylaxis.  He benefited maximally from the hospital stay and there were no complications.    Recent vital signs:  Vitals:   12/30/22 2143 12/31/22 0132  BP: 138/74 (!) 140/73  Pulse: 76 81  Resp: 19 18  Temp: 98.3 F (36.8 C) 98.5 F (36.9 C)  SpO2: 98% 96%    Recent laboratory studies:  Lab Results  Component Value Date   HGB 13.7 12/31/2022   HGB 14.0 12/30/2022   HGB 14.1 12/29/2022   Lab Results  Component Value Date   WBC 11.7 (H) 12/31/2022   PLT 138 (L) 12/31/2022   Lab Results  Component Value Date   INR 1.4 (H) 01/24/2020   Lab Results  Component Value Date   NA 139 12/31/2022   K 3.6 12/31/2022   CL 105 12/31/2022   CO2 25 12/31/2022   BUN 15 12/31/2022   CREATININE 0.88 12/31/2022   GLUCOSE 156 (H) 12/31/2022    Discharge Medications:   Allergies as of 12/31/2022  Reactions   Contrast Media [iodinated Contrast Media] Hives, Other (See Comments)   Had "welts" on arm, and kidney issues after given dye in the past   Penicillins Other (See Comments)   Passed out as a child Has patient had a PCN reaction causing immediate rash, facial/tongue/throat swelling, SOB or lightheadedness with hypotension: Unk Has patient had a PCN reaction causing severe rash involving mucus membranes or skin necrosis: Unk Has patient had a PCN reaction that required hospitalization: Unk Has patient had a PCN reaction occurring within the last 10 years: No If all of the above answers are "NO", then may proceed with Cephalosporin use.   Jardiance [empagliflozin] Diarrhea        Medication List     TAKE these medications    aspirin EC 81 MG  tablet Take 81 mg by mouth in the morning. Swallow whole.   atorvastatin 80 MG tablet Commonly known as: LIPITOR Take 80 mg by mouth at bedtime.   carvedilol 25 MG tablet Commonly known as: COREG Take 1 tablet (25 mg total) by mouth 2 (two) times daily with a meal.   fluticasone 50 MCG/ACT nasal spray Commonly known as: FLONASE Place 1 spray into both nostrils daily as needed for allergies or rhinitis.   furosemide 20 MG tablet Commonly known as: LASIX Take 1 tablet (20 mg total) by mouth daily as needed (swelling).   glimepiride 1 MG tablet Commonly known as: AMARYL Take 1 mg by mouth daily with breakfast.   isosorbide mononitrate 30 MG 24 hr tablet Commonly known as: IMDUR Take 1 tablet (30 mg total) by mouth daily.   latanoprost 0.005 % ophthalmic solution Commonly known as: XALATAN Place 1 drop into the left eye at bedtime.   lisinopril 40 MG tablet Commonly known as: ZESTRIL Take 40 mg by mouth daily.   Magnesium 400 MG Tabs Take 1 tablet by mouth daily.   metFORMIN 1000 MG tablet Commonly known as: GLUCOPHAGE Take 1,000 mg by mouth 2 (two) times daily with a meal.   mirabegron ER 25 MG Tb24 tablet Commonly known as: MYRBETRIQ Take 1 tablet (25 mg total) by mouth daily.   nitroGLYCERIN 0.4 MG SL tablet Commonly known as: NITROSTAT Place 0.4 mg under the tongue every 5 (five) minutes x 3 doses as needed for chest pain.   phenazopyridine 100 MG tablet Commonly known as: Pyridium Take 1 tablet (100 mg total) by mouth every 8 (eight) hours as needed.   Potassium Chloride ER 20 MEQ Tbcr TAKE 2 TABLETS BY MOUTH DAILY What changed:  how much to take when to take this reasons to take this   Simbrinza 1-0.2 % Susp Generic drug: Brinzolamide-Brimonidine Place 1 drop into the left eye 2 (two) times daily.   spironolactone 25 MG tablet Commonly known as: ALDACTONE Take 0.5 tablets (12.5 mg total) by mouth daily.   tamsulosin 0.4 MG Caps capsule Commonly  known as: FLOMAX Take 0.4 mg by mouth 2 (two) times daily.   traMADol 50 MG tablet Commonly known as: Ultram Take 1 tablet (50 mg total) by mouth every 6 (six) hours as needed.   Trulicity 0.75 MG/0.5ML Soaj Generic drug: Dulaglutide Inject 0.75 mg into the skin once a week.        Diagnostic Studies: No results found.  Disposition: Discharge disposition: 01-Home or Self Care       Discharge Instructions     Discharge patient   Complete by: As directed    Discharge disposition: 01-Home or  Self Care   Discharge patient date: 12/31/2022        Follow-up Information     [facility] [place], Mardene Celeste, MD. Call.   Specialty: Urology Contact information: [address]  [place] [address] [phone]                  Signed: Wilkie Aye 01/04/2023, 10:19 AM

## 2023-01-05 ENCOUNTER — Ambulatory Visit (INDEPENDENT_AMBULATORY_CARE_PROVIDER_SITE_OTHER): Payer: Medicare Other | Admitting: Urology

## 2023-01-05 VITALS — BP 182/116 | HR 101

## 2023-01-05 DIAGNOSIS — R339 Retention of urine, unspecified: Secondary | ICD-10-CM

## 2023-01-05 MED ORDER — CIPROFLOXACIN HCL 500 MG PO TABS: 500.0000 mg | ORAL_TABLET | Freq: Two times a day (BID) | ORAL | 0 refills | Status: DC

## 2023-01-05 NOTE — Progress Notes (Signed)
01/05/2023 9:45 AM   Sean Villarreal 02-07-1940 [id number]  Referring provider: Lawerance Sabal, PA [address] [place],  [place] [address]  Followup after TURP   HPI: Sean Villarreal is 73ZD here for followup after TURP. Pathology benign. Voiding trial passed today   PMH: Past Medical History:  Diagnosis Date   Aortic stenosis    a. 03/2022 Echo: EF 60-65%, no rwma, mild LVH, nl RV fxn, mild-mod AS.   Arthritis    Basal cell carcinoma    Coronary artery disease    a. DES to LAD and LCx April 2014 b. PTCA ostial circumflex 03/2016 due to ISR c.  9/18 PCI/DESx2 osital Lcx (ISR), p/mLCx  d. s/p CABG on 01/24/2020 with LIMA-LAD and SVG-OM.   Essential hypertension    GERD (gastroesophageal reflux disease)    Hematuria    History of blood transfusion 09/2012   History of kidney stones    Hyperlipidemia LDL goal <70    NSTEMI (non-ST elevated myocardial infarction) (HCC) 08/2012   Post-op Afib (HCC)    a. 12/2019-01/2020 following CABG. Briefly on amio.   Type II diabetes mellitus (HCC)     Surgical History: Past Surgical History:  Procedure Laterality Date   BASAL CELL CARCINOMA EXCISION     "right cheek; both shoulders"   CARDIAC CATHETERIZATION     CATARACT EXTRACTION W/ INTRAOCULAR LENS  IMPLANT, BILATERAL Bilateral    COLONOSCOPY N/A 03/19/2016   Procedure: COLONOSCOPY;  Surgeon: Malissa Hippo, MD;  Location: [facility];  Service: Endoscopy;  Laterality: N/A;  9:15   CORONARY ARTERY BYPASS GRAFT N/A 01/24/2020   Procedure: CORONARY ARTERY BYPASS GRAFTING (CABG), ON PUMP, TIMES TWO, USING LEFT INTERNAL MAMMARY ARTERY AND ENDOSCOPICALLY HARVESTED RIGHT GREATER SAPHENOUS VEIN;  Surgeon: Loreli Slot, MD;  Location: [facility] OR;  Service: Open Heart Surgery;  Laterality: N/A;   CORONARY BALLOON ANGIOPLASTY N/A 04/15/2016   Procedure: Coronary Balloon Angioplasty;  Surgeon: Peter M Swaziland, MD;  Location: [facility] INVASIVE CV LAB;  Service: Cardiovascular;  Laterality: N/A;    CORONARY STENT INTERVENTION N/A 10/11/2016   Procedure: CORONARY STENT INTERVENTION;  Surgeon: Corky Crafts, MD;  Location: [facility] INVASIVE CV LAB;  Service: Cardiovascular;  Laterality: N/A;   CYSTOSCOPY N/A 12/30/2022   Procedure: CYSTOSCOPY;  Surgeon: Malen Gauze, MD;  Location: [facility] ORS;  Service: Urology;  Laterality: N/A;   CYSTOSCOPY W/ URETEROSCOPY W/ LITHOTRIPSY  10/2012   /notes 11/10/2012   CYSTOSCOPY WITH STENT PLACEMENT  08/2012; 09/2012   EYE SURGERY Left ~ 02/2016   "for crinkled lens"   INGUINAL HERNIA REPAIR Left 12/24/2020   Procedure: HERNIA REPAIR INGUINAL WITH MESH;  Surgeon: Lucretia Roers, MD;  Location: [facility];  Service: General;  Laterality: Left;   LEFT HEART CATH AND CORONARY ANGIOGRAPHY N/A 04/15/2016   Procedure: Left Heart Cath and Coronary Angiography;  Surgeon: Peter M Swaziland, MD;  Location: [facility] INVASIVE CV LAB;  Service: Cardiovascular;  Laterality: N/A;   LEFT HEART CATH AND CORONARY ANGIOGRAPHY N/A 10/11/2016   Procedure: LEFT HEART CATH AND CORONARY ANGIOGRAPHY;  Surgeon: Corky Crafts, MD;  Location[facility] INVASIVE CV LAB;  Service: Cardiovascular;  Laterality: N/A;   LEFT HEART CATH AND CORONARY ANGIOGRAPHY N/A 03/31/2018   Procedure: LEFT HEART CATH AND CORONARY ANGIOGRAPHY;  Surgeon: Lennette Bihari, MD;  Location: [facility] INVASIVE CV LAB;  Service: Cardiovascular;  Laterality: N/A;   LEFT HEART CATH AND CORONARY ANGIOGRAPHY N/A 01/21/2020   Procedure: LEFT HEART CATH  AND CORONARY ANGIOGRAPHY;  Surgeon: Lyn Records, MD;  Location: [facility] INVASIVE CV LAB;  Service: Cardiovascular;  Laterality: N/A;   LEFT HEART CATHETERIZATION WITH CORONARY ANGIOGRAM N/A 05/05/2012   Procedure: LEFT HEART CATHETERIZATION WITH CORONARY ANGIOGRAM;  Surgeon: Kathleene Hazel, MD;  Location: [facility] CATH LAB;  Service: Cardiovascular;  Laterality: N/A;   LEFT HEART CATHETERIZATION WITH CORONARY ANGIOGRAM N/A 05/15/2012   Procedure: LEFT HEART CATHETERIZATION WITH CORONARY  ANGIOGRAM;  Surgeon: Kathleene Hazel, MD;  Location: [facility] CATH LAB;  Service: Cardiovascular;  Laterality: N/A;   TEE WITHOUT CARDIOVERSION N/A 01/24/2020   Procedure: TRANSESOPHAGEAL ECHOCARDIOGRAM (TEE);  Surgeon: Loreli Slot, MD;  Location: [facility] OR;  Service: Open Heart Surgery;  Laterality: N/A;   TONSILLECTOMY  1947   TRANSURETHRAL RESECTION OF PROSTATE N/A 12/30/2022   Procedure: TRANSURETHRAL RESECTION OF THE PROSTATE (TURP);  Surgeon: Malen Gauze, MD;  Location: [facility];  Service: Urology;  Laterality: N/A;    Home Medications:  Allergies as of 01/05/2023       Reactions   Contrast Media [iodinated Contrast Media] Hives, Other (See Comments)   Had "welts" on arm, and kidney issues after given dye in the past   Penicillins Other (See Comments)   Passed out as a child Has patient had a PCN reaction causing immediate rash, facial/tongue/throat swelling, SOB or lightheadedness with hypotension: Unk Has patient had a PCN reaction causing severe rash involving mucus membranes or skin necrosis: Unk Has patient had a PCN reaction that required hospitalization: Unk Has patient had a PCN reaction occurring within the last 10 years: No If all of the above answers are "NO", then may proceed with Cephalosporin use.   Jardiance [empagliflozin] Diarrhea        Medication List        Accurate as of January 05, 2023  9:45 AM. If you have any questions, ask your nurse or doctor.          aspirin EC 81 MG tablet Take 81 mg by mouth in the morning. Swallow whole.   atorvastatin 80 MG tablet Commonly known as: LIPITOR Take 80 mg by mouth at bedtime.   carvedilol 25 MG tablet Commonly known as: COREG Take 1 tablet (25 mg total) by mouth 2 (two) times daily with a meal.   fluticasone 50 MCG/ACT nasal spray Commonly known as: FLONASE Place 1 spray into both nostrils daily as needed for allergies or rhinitis.   furosemide 20 MG tablet Commonly known as:  LASIX Take 1 tablet (20 mg total) by mouth daily as needed (swelling).   glimepiride 1 MG tablet Commonly known as: AMARYL Take 1 mg by mouth daily with breakfast.   isosorbide mononitrate 30 MG 24 hr tablet Commonly known as: IMDUR Take 1 tablet (30 mg total) by mouth daily.   latanoprost 0.005 % ophthalmic solution Commonly known as: XALATAN Place 1 drop into the left eye at bedtime.   lisinopril 40 MG tablet Commonly known as: ZESTRIL Take 40 mg by mouth daily.   Magnesium 400 MG Tabs Take 1 tablet by mouth daily.   metFORMIN 1000 MG tablet Commonly known as: GLUCOPHAGE Take 1,000 mg by mouth 2 (two) times daily with a meal.   mirabegron ER 25 MG Tb24 tablet Commonly known as: MYRBETRIQ Take 1 tablet (25 mg total) by mouth daily.   nitroGLYCERIN 0.4 MG SL tablet Commonly known as: NITROSTAT Place 0.4 mg under the tongue every 5 (five) minutes x 3 doses as needed for  chest pain.   phenazopyridine 100 MG tablet Commonly known as: Pyridium Take 1 tablet (100 mg total) by mouth every 8 (eight) hours as needed.   Potassium Chloride ER 20 MEQ Tbcr TAKE 2 TABLETS BY MOUTH DAILY What changed:  how much to take when to take this reasons to take this   Simbrinza 1-0.2 % Susp Generic drug: Brinzolamide-Brimonidine Place 1 drop into the left eye 2 (two) times daily.   spironolactone 25 MG tablet Commonly known as: ALDACTONE Take 0.5 tablets (12.5 mg total) by mouth daily.   tamsulosin 0.4 MG Caps capsule Commonly known as: FLOMAX Take 0.4 mg by mouth 2 (two) times daily.   traMADol 50 MG tablet Commonly known as: Ultram Take 1 tablet (50 mg total) by mouth every 6 (six) hours as needed.   Trulicity 0.75 MG/0.5ML Soaj Generic drug: Dulaglutide Inject 0.75 mg into the skin once a week.        Allergies:  Allergies  Allergen Reactions   Contrast Media [Iodinated Contrast Media] Hives and Other (See Comments)    Had "welts" on arm, and kidney issues after  given dye in the past   Penicillins Other (See Comments)    Passed out as a child Has patient had a PCN reaction causing immediate rash, facial/tongue/throat swelling, SOB or lightheadedness with hypotension: Unk Has patient had a PCN reaction causing severe rash involving mucus membranes or skin necrosis: Unk Has patient had a PCN reaction that required hospitalization: Unk Has patient had a PCN reaction occurring within the last 10 years: No If all of the above answers are "NO", then may proceed with Cephalosporin use.    Jardiance [Empagliflozin] Diarrhea    Family History: Family History  Problem Relation Age of Onset   Heart disease Mother    Heart failure Mother    Heart disease Father    Heart attack Father    Colon cancer Neg Hx     Social History:  reports that he quit smoking about 34 years ago. His smoking use included cigarettes. He started smoking about 40 years ago. He has a 22.5 pack-year smoking history. He has never used smokeless tobacco. He reports that he does not currently use alcohol. He reports that he does not use drugs.  ROS: All other review of systems were reviewed and are negative except what is noted above in HPI  Physical Exam: BP (!) 182/116   Pulse (!) 101   Constitutional:  Alert and oriented, No acute distress. HEENT:  AT, moist mucus membranes.  Trachea midline, no masses. Cardiovascular: No clubbing, cyanosis, or edema. Respiratory: Normal respiratory effort, no increased work of breathing. GI: Abdomen is soft, nontender, nondistended, no abdominal masses GU: No CVA tenderness.  Lymph: No cervical or inguinal lymphadenopathy. Skin: No rashes, bruises or suspicious lesions. Neurologic: Grossly intact, no focal deficits, moving all 4 extremities. Psychiatric: Normal mood and affect.  Laboratory Data: Lab Results  Component Value Date   WBC 11.7 (H) 12/31/2022   HGB 13.7 12/31/2022   HCT 40.7 12/31/2022   MCV 94.2 12/31/2022   PLT 138  (L) 12/31/2022    Lab Results  Component Value Date   CREATININE 0.88 12/31/2022    No results found for: "PSA"  No results found for: "TESTOSTERONE"  Lab Results  Component Value Date   HGBA1C 7.6 (H) 12/29/2022    Urinalysis    Component Value Date/Time   COLORURINE YELLOW 07/30/2022 1018   APPEARANCEUR CLEAR 07/30/2022 1018   LABSPEC  1.021 07/30/2022 1018   PHURINE 6.0 07/30/2022 1018   GLUCOSEU NEGATIVE 07/30/2022 1018   HGBUR NEGATIVE 07/30/2022 1018   BILIRUBINUR NEGATIVE 07/30/2022 1018   KETONESUR NEGATIVE 07/30/2022 1018   PROTEINUR NEGATIVE 07/30/2022 1018   NITRITE NEGATIVE 07/30/2022 1018   LEUKOCYTESUR NEGATIVE 07/30/2022 1018    Lab Results  Component Value Date   BACTERIA NONE SEEN 12/05/2020    Pertinent Imaging:  No results found for this or any previous visit.  No results found for this or any previous visit.  No results found for this or any previous visit.  No results found for this or any previous visit.  No results found for this or any previous visit.  No valid procedures specified. No results found for this or any previous visit.  No results found for this or any previous visit.   Assessment & Plan:    1. Urinary retention Followup 1 month for a PVR   No follow-ups on file.  Wilkie Aye, MD  [facility] [place]

## 2023-01-05 NOTE — Progress Notes (Addendum)
Fill and Pull Catheter Removal  Patient is present today for a catheter removal.  Patient was cleaned and prepped in a sterile fashion 50ml of sterile water/ saline was instilled into the bladder when the patient felt the urge to urinate. 30ml of water was then drained from the balloon.  A 22FR foley cath was removed from the bladder no complications were noted .  Patient as then given some time to void on their own.  Patient cannot void on their own after some time.  Patient tolerated well.  Performed by: Gwendolyn Grant CMA  Follow up/ Additional: Follow up per MD  note   Cipro sent to pharmacy patient called and made aware.

## 2023-01-09 NOTE — Anesthesia Postprocedure Evaluation (Signed)
Anesthesia Post Note  Patient: Sean Villarreal  Procedure(s) Performed: CYSTOSCOPY (Bladder) TRANSURETHRAL RESECTION OF THE PROSTATE (TURP) (Prostate)  Patient location during evaluation: Phase II Anesthesia Type: General Level of consciousness: awake Pain management: pain level controlled Vital Signs Assessment: post-procedure vital signs reviewed and stable Respiratory status: spontaneous breathing and respiratory function stable Cardiovascular status: blood pressure returned to baseline and stable Postop Assessment: no headache and no apparent nausea or vomiting Anesthetic complications: no Comments: Late entry   No notable events documented.   Last Vitals:  Vitals:   12/30/22 2143 12/31/22 0132  BP: 138/74 (!) 140/73  Pulse: 76 81  Resp: 19 18  Temp: 36.8 C 36.9 C  SpO2: 98% 96%    Last Pain:  Vitals:   12/31/22 1542  TempSrc:   PainSc: 2                  Windell Norfolk

## 2023-02-01 DIAGNOSIS — E119 Type 2 diabetes mellitus without complications: Secondary | ICD-10-CM | POA: Diagnosis not present

## 2023-02-01 DIAGNOSIS — Z961 Presence of intraocular lens: Secondary | ICD-10-CM | POA: Diagnosis not present

## 2023-02-01 DIAGNOSIS — H43813 Vitreous degeneration, bilateral: Secondary | ICD-10-CM | POA: Diagnosis not present

## 2023-02-01 DIAGNOSIS — Z9849 Cataract extraction status, unspecified eye: Secondary | ICD-10-CM | POA: Diagnosis not present

## 2023-02-01 DIAGNOSIS — H401122 Primary open-angle glaucoma, left eye, moderate stage: Secondary | ICD-10-CM | POA: Diagnosis not present

## 2023-02-01 DIAGNOSIS — H1045 Other chronic allergic conjunctivitis: Secondary | ICD-10-CM | POA: Diagnosis not present

## 2023-02-03 DIAGNOSIS — Z6829 Body mass index (BMI) 29.0-29.9, adult: Secondary | ICD-10-CM | POA: Diagnosis not present

## 2023-02-03 DIAGNOSIS — Z9079 Acquired absence of other genital organ(s): Secondary | ICD-10-CM | POA: Diagnosis not present

## 2023-02-03 DIAGNOSIS — Z0001 Encounter for general adult medical examination with abnormal findings: Secondary | ICD-10-CM | POA: Diagnosis not present

## 2023-02-03 DIAGNOSIS — Z2911 Encounter for prophylactic immunotherapy for respiratory syncytial virus (RSV): Secondary | ICD-10-CM | POA: Diagnosis not present

## 2023-02-03 DIAGNOSIS — I10 Essential (primary) hypertension: Secondary | ICD-10-CM | POA: Diagnosis not present

## 2023-02-03 DIAGNOSIS — Z23 Encounter for immunization: Secondary | ICD-10-CM | POA: Diagnosis not present

## 2023-02-03 DIAGNOSIS — E1165 Type 2 diabetes mellitus with hyperglycemia: Secondary | ICD-10-CM | POA: Diagnosis not present

## 2023-02-03 NOTE — Progress Notes (Signed)
 Name: Sean Villarreal DOB: March 15, 1940 MRN: [id number]  History of Present Illness: Mr. Glotfelty is a 83 y.o. male who presents today at [facility] .  - GU history: 1. 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 - Taking Flomax  0.4 mg twice daily. 2. Kidney stone(s).  Recent history: - 12/30/2022: Underwent TURP by Dr. Sherrilee. Pathology benign.   - 01/05/2023: Postop visit with Dr. Sherrilee. Passed voiding trial. Advised follow up in 1 month for a PVR check.  Today: He reports urinary urgency, frequency (7-8x/day), nocturia x5-6, and urge incontinence. He states the UUI is worse than before surgery. Reports the urinary frequency and nocturia seem to be slowly improving. Reports pink-tinged urine and still passing some clots. Denies dysuria, fevers, abdominal pain, flank pain, or sensations of incomplete emptying.   Fall Screening: Do you usually have a device to assist in your mobility? No   Medications: Current Outpatient Medications  Medication Sig Dispense Refill   aspirin  EC 81 MG tablet Take 81 mg by mouth in the morning. Swallow whole.     atorvastatin  (LIPITOR ) 80 MG tablet Take 80 mg by mouth at bedtime.     carvedilol  (COREG ) 25 MG tablet Take 1 tablet (25 mg total) by mouth 2 (two) times daily with a meal. 60 tablet 11   ciprofloxacin  (CIPRO ) 500 MG tablet Take 1 tablet (500 mg total) by mouth every 12 (twelve) hours. 1 tablet 0   fluticasone  (FLONASE ) 50 MCG/ACT nasal spray Place 1 spray into both nostrils daily as needed for allergies or rhinitis.     furosemide  (LASIX ) 20 MG tablet Take 1 tablet (20 mg total) by mouth daily as needed (swelling).     glimepiride  (AMARYL ) 1 MG tablet Take 1 mg by mouth daily with breakfast.     isosorbide  mononitrate (IMDUR ) 30 MG 24 hr tablet Take 1 tablet (30 mg total) by mouth daily. 90 tablet 3   latanoprost  (XALATAN ) 0.005 % ophthalmic solution Place 1 drop into the left eye at bedtime.     lisinopril  (ZESTRIL )  40 MG tablet Take 40 mg by mouth daily.     Magnesium  400 MG TABS Take 1 tablet by mouth daily. 90 tablet 3   metFORMIN  (GLUCOPHAGE ) 1000 MG tablet Take 1,000 mg by mouth 2 (two) times daily with a meal.     nitroGLYCERIN  (NITROSTAT ) 0.4 MG SL tablet Place 0.4 mg under the tongue every 5 (five) minutes x 3 doses as needed for chest pain.     Potassium Chloride  ER 20 MEQ TBCR TAKE 2 TABLETS BY MOUTH DAILY (Patient taking differently: Take 20 mEq by mouth daily as needed (Takes with Lasix ).) 180 tablet 3   SIMBRINZA 1-0.2 % SUSP Place 1 drop into the left eye 2 (two) times daily.     spironolactone  (ALDACTONE ) 25 MG tablet Take 0.5 tablets (12.5 mg total) by mouth daily. 45 tablet 1   tamsulosin  (FLOMAX ) 0.4 MG CAPS capsule Take 0.4 mg by mouth 2 (two) times daily.     TRULICITY  0.75 MG/0.5ML SOPN Inject 0.75 mg into the skin once a week.     Vibegron  (GEMTESA ) 75 MG TABS Take 1 tablet (75 mg total) by mouth daily. 45 tablet    phenazopyridine  (PYRIDIUM ) 100 MG tablet Take 1 tablet (100 mg total) by mouth every 8 (eight) hours as needed. (Patient not taking: Reported on 02/08/2023) 10 tablet 0   traMADol  (ULTRAM ) 50 MG tablet Take 1 tablet (50 mg total) by mouth every  6 (six) hours as needed. (Patient not taking: Reported on 02/08/2023) 15 tablet 0   Current Facility-Administered Medications  Medication Dose Route Frequency Provider Last Rate Last Admin   ciprofloxacin  (CIPRO ) tablet 500 mg  500 mg Oral Once McKenzie, Belvie CROME, MD        Allergies: Allergies  Allergen Reactions   Contrast Media [Iodinated Contrast Media] Hives and Other (See Comments)    Had welts on arm, and kidney issues after given dye in the past   Penicillins Other (See Comments)    Passed out as a child Has patient had a PCN reaction causing immediate rash, facial/tongue/throat swelling, SOB or lightheadedness with hypotension: Unk Has patient had a PCN reaction causing severe rash involving mucus membranes or skin  necrosis: Unk Has patient had a PCN reaction that required hospitalization: Unk Has patient had a PCN reaction occurring within the last 10 years: No If all of the above answers are NO, then may proceed with Cephalosporin use.    Jardiance  [Empagliflozin ] Diarrhea    Past Medical History:  Diagnosis Date   Aortic stenosis    a. 03/2022 Echo: EF 60-65%, no rwma, mild LVH, nl RV fxn, mild-mod AS.   Arthritis    Basal cell carcinoma    Coronary artery disease    a. DES to LAD and LCx April 2014 b. PTCA ostial circumflex 03/2016 due to ISR c.  9/18 PCI/DESx2 osital Lcx (ISR), p/mLCx  d. s/p CABG on 01/24/2020 with LIMA-LAD and SVG-OM.   Essential hypertension    GERD (gastroesophageal reflux disease)    Hematuria    History of blood transfusion 09/2012   History of kidney stones    Hyperlipidemia LDL goal <70    NSTEMI (non-ST elevated myocardial infarction) (HCC) 08/2012   Post-op Afib (HCC)    a. 12/2019-01/2020 following CABG. Briefly on amio.   Type II diabetes mellitus (HCC)    Past Surgical History:  Procedure Laterality Date   BASAL CELL CARCINOMA EXCISION     right cheek; both shoulders   CARDIAC CATHETERIZATION     CATARACT EXTRACTION W/ INTRAOCULAR LENS  IMPLANT, BILATERAL Bilateral    COLONOSCOPY N/A 03/19/2016   Procedure: COLONOSCOPY;  Surgeon: Claudis RAYMOND Rivet, MD;  Location: [facility];  Service: Endoscopy;  Laterality: N/A;  9:15   CORONARY ARTERY BYPASS GRAFT N/A 01/24/2020   Procedure: CORONARY ARTERY BYPASS GRAFTING (CABG), ON PUMP, TIMES TWO, USING LEFT INTERNAL MAMMARY ARTERY AND ENDOSCOPICALLY HARVESTED RIGHT GREATER SAPHENOUS VEIN;  Surgeon: Kerrin Elspeth BROCKS, MD;  Location: [facility] OR;  Service: Open Heart Surgery;  Laterality: N/A;   CORONARY BALLOON ANGIOPLASTY N/A 04/15/2016   Procedure: Coronary Balloon Angioplasty;  Surgeon: Peter M Jordan, MD;  Location: [facility] INVASIVE CV LAB;  Service: Cardiovascular;  Laterality: N/A;   CORONARY STENT INTERVENTION N/A  10/11/2016   Procedure: CORONARY STENT INTERVENTION;  Surgeon: Dann Candyce RAMAN, MD;  Location: [facility] INVASIVE CV LAB;  Service: Cardiovascular;  Laterality: N/A;   CYSTOSCOPY N/A 12/30/2022   Procedure: CYSTOSCOPY;  Surgeon: Sherrilee Belvie CROME, MD;  Location: [facility] ORS;  Service: Urology;  Laterality: N/A;   CYSTOSCOPY W/ URETEROSCOPY W/ LITHOTRIPSY  10/2012   /notes 11/10/2012   CYSTOSCOPY WITH STENT PLACEMENT  08/2012; 09/2012   EYE SURGERY Left ~ 02/2016   for crinkled lens   INGUINAL HERNIA REPAIR Left 12/24/2020   Procedure: HERNIA REPAIR INGUINAL WITH MESH;  Surgeon: Kallie Manuelita BROCKS, MD;  Location: [facility];  Service: General;  Laterality: Left;  LEFT HEART CATH AND CORONARY ANGIOGRAPHY N/A 04/15/2016   Procedure: Left Heart Cath and Coronary Angiography;  Surgeon: Peter M Jordan, MD;  Location: [facility] INVASIVE CV LAB;  Service: Cardiovascular;  Laterality: N/A;   LEFT HEART CATH AND CORONARY ANGIOGRAPHY N/A 10/11/2016   Procedure: LEFT HEART CATH AND CORONARY ANGIOGRAPHY;  Surgeon: Dann Candyce RAMAN, MD;  Location: [facility] INVASIVE CV LAB;  Service: Cardiovascular;  Laterality: N/A;   LEFT HEART CATH AND CORONARY ANGIOGRAPHY N/A 03/31/2018   Procedure: LEFT HEART CATH AND CORONARY ANGIOGRAPHY;  Surgeon: Burnard Debby LABOR, MD;  Location: [facility] INVASIVE CV LAB;  Service: Cardiovascular;  Laterality: N/A;   LEFT HEART CATH AND CORONARY ANGIOGRAPHY N/A 01/21/2020   Procedure: LEFT HEART CATH AND CORONARY ANGIOGRAPHY;  Surgeon: Claudene Victory ORN, MD;  Location: [facility] INVASIVE CV LAB;  Service: Cardiovascular;  Laterality: N/A;   LEFT HEART CATHETERIZATION WITH CORONARY ANGIOGRAM N/A 05/05/2012   Procedure: LEFT HEART CATHETERIZATION WITH CORONARY ANGIOGRAM;  Surgeon: Lonni JONETTA Cash, MD;  Location: [facility] CATH LAB;  Service: Cardiovascular;  Laterality: N/A;   LEFT HEART CATHETERIZATION WITH CORONARY ANGIOGRAM N/A 05/15/2012   Procedure: LEFT HEART CATHETERIZATION WITH CORONARY ANGIOGRAM;  Surgeon: Lonni JONETTA Cash, MD;  Location: [facility] CATH LAB;  Service: Cardiovascular;  Laterality: N/A;   TEE WITHOUT CARDIOVERSION N/A 01/24/2020   Procedure: TRANSESOPHAGEAL ECHOCARDIOGRAM (TEE);  Surgeon: Kerrin Elspeth BROCKS, MD;  Location: [facility] OR;  Service: Open Heart Surgery;  Laterality: N/A;   TONSILLECTOMY  1947   TRANSURETHRAL RESECTION OF PROSTATE N/A 12/30/2022   Procedure: TRANSURETHRAL RESECTION OF THE PROSTATE (TURP);  Surgeon: Sherrilee Belvie CROME, MD;  Location: [facility]S;  Service: Urology;  Laterality: N/A;   Family History  Problem Relation Age of Onset   Heart disease Mother    Heart failure Mother    Heart disease Father    Heart attack Father    Colon cancer Neg Hx    Social History   Socioeconomic History   Marital status: Widowed    Spouse name: Not on file   Number of children: Not on file   Years of education: Not on file   Highest education level: Not on file  Occupational History   Not on file  Tobacco Use   Smoking status: Former    Current packs/day: 0.00    Average packs/day: 0.8 packs/day for 30.0 years (22.5 ttl pk-yrs)    Types: Cigarettes    Start date: 01/25/1982    Quit date: 11/05/1988    Years since quitting: 34.2   Smokeless tobacco: Never  Vaping Use   Vaping status: Never Used  Substance and Sexual Activity   Alcohol  use: Not Currently    Alcohol /week: 0.0 standard drinks of alcohol    Drug use: No   Sexual activity: Not Currently  Other Topics Concern   Not on file  Social History Narrative   Not on file   Social Drivers of Health   Financial Resource Strain: Not on file  Food Insecurity: No Food Insecurity (12/30/2022)   Hunger Vital Sign    Worried About Running Out of Food in the Last Year: Never true    Ran Out of Food in the Last Year: Never true  Transportation Needs: No Transportation Needs (12/30/2022)   PRAPARE - Administrator, Civil Service (Medical): No    Lack of Transportation (Non-Medical): No  Physical Activity: Not on  file  Stress: Not on file  Social Connections: Not on file  Intimate Partner Violence:  Not At Risk (12/30/2022)   Humiliation, Afraid, Rape, and Kick questionnaire    Fear of Current or Ex-Partner: No    Emotionally Abused: No    Physically Abused: No    Sexually Abused: No    Review of Systems Constitutional: Patient denies any unintentional weight loss or change in strength lntegumentary: Patient denies any rashes or pruritus Cardiovascular: Patient denies chest pain or syncope Respiratory: Patient denies shortness of breath Gastrointestinal: Patient denies nausea, vomiting, constipation, or diarrhea Musculoskeletal: Patient denies muscle cramps or weakness Neurologic: Patient denies convulsions or seizures Allergic/Immunologic: Patient denies recent allergic reaction(s) Hematologic/Lymphatic: Patient denies bleeding tendencies Endocrine: Patient denies heat/cold intolerance  GU: As per HPI.  OBJECTIVE Vitals:   02/08/23 1532  BP: (!) 159/73  Pulse: 97  Temp: 98.7 F (37.1 C)   There is no height or weight on file to calculate BMI.  Physical Examination Constitutional: No obvious distress; patient is non-toxic appearing  Cardiovascular: No visible lower extremity edema.  Respiratory: The patient does not have audible wheezing/stridor; respirations do not appear labored  Gastrointestinal: Abdomen non-distended Musculoskeletal: Normal ROM of UEs  Skin: No obvious rashes/open sores  Neurologic: CN 2-12 grossly intact Psychiatric: Answered questions appropriately with normal affect  Hematologic/Lymphatic/Immunologic: No obvious bruises or sites of spontaneous bleeding  PVR: 0 ml  ASSESSMENT Benign prostatic hyperplasia with urinary obstruction - Plan: Urinalysis, Routine w reflex microscopic, BLADDER SCAN AMB NON-IMAGING  Postop check - Plan: Urinalysis, Routine w reflex microscopic, BLADDER SCAN AMB NON-IMAGING  S/P TURP - Plan: Urinalysis, Routine w reflex  microscopic, BLADDER SCAN AMB NON-IMAGING  Lower urinary tract symptoms (LUTS) - Plan: Vibegron  (GEMTESA ) 75 MG TABS  Normal PVR; retention is resolved. He remains bothered by LUTS however - urinary frequency, nocturia, urgency, and urge incontinence. We agreed to trial Gemtesa  75 mg daily; potential side effects discussed. Will plan to follow up in 6 weeks for recheck. Symptoms may improve with more time for resolution of any lingering postop inflammation as well. Pt verbalized understanding and agreement. All questions were answered.  PLAN Advised the following: 1. Gemtesa  75 mg daily; samples provided. 2. Return in about 6 weeks (around 03/22/2023) for UA, PVR, & f/u with Lauraine Oz NP.  Orders Placed This Encounter  Procedures   Urinalysis, Routine w reflex microscopic   BLADDER SCAN AMB NON-IMAGING    It has been explained that the patient is to follow regularly with their PCP in addition to all other providers involved in their care and to follow instructions provided by these respective offices. Patient advised to contact urology clinic if any urologic-pertaining questions, concerns, new symptoms or problems arise in the interim period.  There are no Patient Instructions on file for this visit.  Electronically signed by:  Lauraine JAYSON Oz, FNP   02/08/23    3:54 PM

## 2023-02-08 ENCOUNTER — Encounter: Payer: Self-pay | Admitting: Urology

## 2023-02-08 ENCOUNTER — Ambulatory Visit (INDEPENDENT_AMBULATORY_CARE_PROVIDER_SITE_OTHER): Payer: Medicare Other | Admitting: Urology

## 2023-02-08 VITALS — BP 159/73 | HR 97 | Temp 98.7°F

## 2023-02-08 DIAGNOSIS — N3941 Urge incontinence: Secondary | ICD-10-CM

## 2023-02-08 DIAGNOSIS — N138 Other obstructive and reflux uropathy: Secondary | ICD-10-CM | POA: Diagnosis not present

## 2023-02-08 DIAGNOSIS — N401 Enlarged prostate with lower urinary tract symptoms: Secondary | ICD-10-CM

## 2023-02-08 DIAGNOSIS — R350 Frequency of micturition: Secondary | ICD-10-CM

## 2023-02-08 DIAGNOSIS — R399 Unspecified symptoms and signs involving the genitourinary system: Secondary | ICD-10-CM

## 2023-02-08 DIAGNOSIS — Z9079 Acquired absence of other genital organ(s): Secondary | ICD-10-CM

## 2023-02-08 DIAGNOSIS — Z09 Encounter for follow-up examination after completed treatment for conditions other than malignant neoplasm: Secondary | ICD-10-CM

## 2023-02-08 DIAGNOSIS — R351 Nocturia: Secondary | ICD-10-CM

## 2023-02-08 LAB — BLADDER SCAN AMB NON-IMAGING: Scan Result: 0

## 2023-02-08 MED ORDER — GEMTESA 75 MG PO TABS: 1.0000 | ORAL_TABLET | Freq: Every day | ORAL | Status: DC

## 2023-03-01 DIAGNOSIS — D692 Other nonthrombocytopenic purpura: Secondary | ICD-10-CM | POA: Diagnosis not present

## 2023-03-01 DIAGNOSIS — Z85828 Personal history of other malignant neoplasm of skin: Secondary | ICD-10-CM | POA: Diagnosis not present

## 2023-03-01 DIAGNOSIS — B079 Viral wart, unspecified: Secondary | ICD-10-CM | POA: Diagnosis not present

## 2023-03-01 DIAGNOSIS — L821 Other seborrheic keratosis: Secondary | ICD-10-CM | POA: Diagnosis not present

## 2023-03-01 DIAGNOSIS — L853 Xerosis cutis: Secondary | ICD-10-CM | POA: Diagnosis not present

## 2023-03-01 DIAGNOSIS — L570 Actinic keratosis: Secondary | ICD-10-CM | POA: Diagnosis not present

## 2023-03-22 NOTE — Progress Notes (Unsigned)
 Name: Sean Villarreal DOB: 1940/12/08 MRN: [id number]  History of Present Illness: Sean Villarreal is a 83 y.o. male who presents today for follow up visit at [facility].  - GU history: 1. 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 - Taking Flomax 0.4 mg twice daily. - 12/30/2022: Underwent TURP by Dr. Ronne Binning. Pathology benign.  2. OAB with urinary frequency, nocturia, urgency, and urge incontinence. 3. Kidney stone(s).   At last visit on 02/08/2023: - Reported bothersome urinary urgency, frequency (7-8x/day), nocturia x5-6, and urge incontinence.  - PVR = 0 ml.  - The plan was trial of Gemtesa 75 mg daily for 6 weeks.  Today: He reports that he took [place] for 2-3 weeks then discontinued it because it caused diarrhea. While taking it he reports that it was somewhat helpful for his urinary symptoms.  He reports ongoing urinary frequency, nocturia, urgency, and urge incontinence but does report that these symptoms seem to be improving over the past month even after discontinuing Gemtesa. He only has the urgency and urge incontinence during the day; not overnight. Voiding approximately every 2-3 hours during the day and 2x/night on average. Denies needing to wear pads / diapers at home but does wear them if he leaves the house "just in case".  He denies dysuria, gross hematuria, straining to void, or sensations of incomplete emptying.  Fall Screening: Do you usually have a device to assist in your mobility? No   Medications: Current Outpatient Medications  Medication Sig Dispense Refill   aspirin EC 81 MG tablet Take 81 mg by mouth in the morning. Swallow whole.     atorvastatin (LIPITOR) 80 MG tablet Take 80 mg by mouth at bedtime.     carvedilol (COREG) 25 MG tablet Take 1 tablet (25 mg total) by mouth 2 (two) times daily with a meal. 60 tablet 11   ciprofloxacin (CIPRO) 500 MG tablet Take 1 tablet (500 mg total) by mouth every 12 (twelve) hours. 1 tablet 0    fluticasone (FLONASE) 50 MCG/ACT nasal spray Place 1 spray into both nostrils daily as needed for allergies or rhinitis.     furosemide (LASIX) 20 MG tablet Take 1 tablet (20 mg total) by mouth daily as needed (swelling).     glimepiride (AMARYL) 1 MG tablet Take 1 mg by mouth daily with breakfast.     isosorbide mononitrate (IMDUR) 30 MG 24 hr tablet Take 1 tablet (30 mg total) by mouth daily. 90 tablet 3   latanoprost (XALATAN) 0.005 % ophthalmic solution Place 1 drop into the left eye at bedtime.     lisinopril (ZESTRIL) 40 MG tablet Take 40 mg by mouth daily.     Magnesium 400 MG TABS Take 1 tablet by mouth daily. 90 tablet 3   metFORMIN (GLUCOPHAGE) 1000 MG tablet Take 1,000 mg by mouth 2 (two) times daily with a meal.     nitroGLYCERIN (NITROSTAT) 0.4 MG SL tablet Place 0.4 mg under the tongue every 5 (five) minutes x 3 doses as needed for chest pain.     Potassium Chloride ER 20 MEQ TBCR TAKE 2 TABLETS BY MOUTH DAILY (Patient taking differently: Take 20 mEq by mouth daily as needed (Takes with Lasix).) 180 tablet 3   SIMBRINZA 1-0.2 % SUSP Place 1 drop into the left eye 2 (two) times daily.     spironolactone (ALDACTONE) 25 MG tablet Take 0.5 tablets (12.5 mg total) by mouth daily. 45 tablet 1   tamsulosin (FLOMAX) 0.4 MG  CAPS capsule Take 0.4 mg by mouth 2 (two) times daily.     traMADol (ULTRAM) 50 MG tablet Take 1 tablet (50 mg total) by mouth every 6 (six) hours as needed. 15 tablet 0   TRULICITY 0.75 MG/0.5ML SOPN Inject 0.75 mg into the skin once a week.     phenazopyridine (PYRIDIUM) 100 MG tablet Take 1 tablet (100 mg total) by mouth every 8 (eight) hours as needed. (Patient not taking: Reported on 03/23/2023) 10 tablet 0   Current Facility-Administered Medications  Medication Dose Route Frequency Provider Last Rate Last Admin   ciprofloxacin (CIPRO) tablet 500 mg  500 mg Oral Once McKenzie, Mardene Celeste, MD        Allergies: Allergies  Allergen Reactions   Contrast Media  [Iodinated Contrast Media] Hives and Other (See Comments)    Had "welts" on arm, and kidney issues after given dye in the past   Penicillins Other (See Comments)    Passed out as a child Has patient had a PCN reaction causing immediate rash, facial/tongue/throat swelling, SOB or lightheadedness with hypotension: Unk Has patient had a PCN reaction causing severe rash involving mucus membranes or skin necrosis: Unk Has patient had a PCN reaction that required hospitalization: Unk Has patient had a PCN reaction occurring within the last 10 years: No If all of the above answers are "NO", then may proceed with Cephalosporin use.    Jardiance [Empagliflozin] Diarrhea    Past Medical History:  Diagnosis Date   Aortic stenosis    a. 03/2022 Echo: EF 60-65%, no rwma, mild LVH, nl RV fxn, mild-mod AS.   Arthritis    Basal cell carcinoma    Coronary artery disease    a. DES to LAD and LCx April 2014 b. PTCA ostial circumflex 03/2016 due to ISR c.  9/18 PCI/DESx2 osital Lcx (ISR), p/mLCx  d. s/p CABG on 01/24/2020 with LIMA-LAD and SVG-OM.   Essential hypertension    GERD (gastroesophageal reflux disease)    Hematuria    History of blood transfusion 09/2012   History of kidney stones    Hyperlipidemia LDL goal <70    NSTEMI (non-ST elevated myocardial infarction) (HCC) 08/2012   Post-op Afib (HCC)    a. 12/2019-01/2020 following CABG. Briefly on amio.   Type II diabetes mellitus (HCC)    Past Surgical History:  Procedure Laterality Date   BASAL CELL CARCINOMA EXCISION     "right cheek; both shoulders"   CARDIAC CATHETERIZATION     CATARACT EXTRACTION W/ INTRAOCULAR LENS  IMPLANT, BILATERAL Bilateral    COLONOSCOPY N/A 03/19/2016   Procedure: COLONOSCOPY;  Surgeon: Malissa Hippo, MD;  Location: [facility];  Service: Endoscopy;  Laterality: N/A;  9:15   CORONARY ARTERY BYPASS GRAFT N/A 01/24/2020   Procedure: CORONARY ARTERY BYPASS GRAFTING (CABG), ON PUMP, TIMES TWO, USING LEFT INTERNAL  MAMMARY ARTERY AND ENDOSCOPICALLY HARVESTED RIGHT GREATER SAPHENOUS VEIN;  Surgeon: Loreli Slot, MD;  Location: [facility] OR;  Service: Open Heart Surgery;  Laterality: N/A;   CORONARY BALLOON ANGIOPLASTY N/A 04/15/2016   Procedure: Coronary Balloon Angioplasty;  Surgeon: Peter M Swaziland, MD;  Location: [facility] INVASIVE CV LAB;  Service: Cardiovascular;  Laterality: N/A;   CORONARY STENT INTERVENTION N/A 10/11/2016   Procedure: CORONARY STENT INTERVENTION;  Surgeon: Corky Crafts, MD;  [facility] INVASIVE CV LAB;  Service: Cardiovascular;  Laterality: N/A;   CYSTOSCOPY N/A 12/30/2022   Procedure: CYSTOSCOPY;  Surgeon: Malen Gauze, MD;  Location: [facility];  Service:  Urology;  Laterality: N/A;   CYSTOSCOPY W/ URETEROSCOPY W/ LITHOTRIPSY  10/2012   /notes 11/10/2012   CYSTOSCOPY WITH STENT PLACEMENT  08/2012; 09/2012   EYE SURGERY Left ~ 02/2016   "for crinkled lens"   INGUINAL HERNIA REPAIR Left 12/24/2020   Procedure: HERNIA REPAIR INGUINAL WITH MESH;  Surgeon: Lucretia Roers, MD;  Location: [facility];  Service: General;  Laterality: Left;   LEFT HEART CATH AND CORONARY ANGIOGRAPHY N/A 04/15/2016   Procedure: Left Heart Cath and Coronary Angiography;  Surgeon: Peter M Swaziland, MD;  Location: [facility] INVASIVE CV LAB;  Service: Cardiovascular;  Laterality: N/A;   LEFT HEART CATH AND CORONARY ANGIOGRAPHY N/A 10/11/2016   Procedure: LEFT HEART CATH AND CORONARY ANGIOGRAPHY;  Surgeon: Corky Crafts, MD;  Location: [facility] INVASIVE CV LAB;  Service: Cardiovascular;  Laterality: N/A;   LEFT HEART CATH AND CORONARY ANGIOGRAPHY N/A 03/31/2018   Procedure: LEFT HEART CATH AND CORONARY ANGIOGRAPHY;  Surgeon: Lennette Bihari, MD;  Location: [facility] INVASIVE CV LAB;  Service: Cardiovascular;  Laterality: N/A;   LEFT HEART CATH AND CORONARY ANGIOGRAPHY N/A 01/21/2020   Procedure: LEFT HEART CATH AND CORONARY ANGIOGRAPHY;  Surgeon: Lyn Records, MD;  Location: [facility] INVASIVE CV LAB;  Service: Cardiovascular;   Laterality: N/A;   LEFT HEART CATHETERIZATION WITH CORONARY ANGIOGRAM N/A 05/05/2012   Procedure: LEFT HEART CATHETERIZATION WITH CORONARY ANGIOGRAM;  Surgeon: Kathleene Hazel, MD;  Location: [facility] CATH LAB;  Service: Cardiovascular;  Laterality: N/A;   LEFT HEART CATHETERIZATION WITH CORONARY ANGIOGRAM N/A 05/15/2012   Procedure: LEFT HEART CATHETERIZATION WITH CORONARY ANGIOGRAM;  Surgeon: Kathleene Hazel, MD;  Location: [facility] CATH LAB;  Service: Cardiovascular;  Laterality: N/A;   TEE WITHOUT CARDIOVERSION N/A 01/24/2020   Procedure: TRANSESOPHAGEAL ECHOCARDIOGRAM (TEE);  Surgeon: Loreli Slot, MD;  Location: [facility] OR;  Service: Open Heart Surgery;  Laterality: N/A;   TONSILLECTOMY  1947   TRANSURETHRAL RESECTION OF PROSTATE N/A 12/30/2022   Procedure: TRANSURETHRAL RESECTION OF THE PROSTATE (TURP);  Surgeon: Malen Gauze, MD;  Location: [facility];  Service: Urology;  Laterality: N/A;   Family History  Problem Relation Age of Onset   Heart disease Mother    Heart failure Mother    Heart disease Father    Heart attack Father    Colon cancer Neg Hx    Social History   Socioeconomic History   Marital status: Widowed    Spouse name: Not on file   Number of children: Not on file   Years of education: Not on file   Highest education level: Not on file  Occupational History   Not on file  Tobacco Use   Smoking status: Former    Current packs/day: 0.00    Average packs/day: 0.8 packs/day for 30.0 years (22.5 ttl pk-yrs)    Types: Cigarettes    Start date: 01/25/1982    Quit date: 11/05/1988    Years since quitting: 34.4   Smokeless tobacco: Never  Vaping Use   Vaping status: Never Used  Substance and Sexual Activity   Alcohol use: Not Currently    Alcohol/week: 0.0 standard drinks of alcohol   Drug use: No   Sexual activity: Not Currently  Other Topics Concern   Not on file  Social History Narrative   Not on file   Social Drivers of Health   Financial  Resource Strain: Not on file  Food Insecurity: No Food Insecurity (12/30/2022)   Hunger Vital Sign    Worried About Running  Out of Food in the Last Year: Never true    Ran Out of Food in the Last Year: Never true  Transportation Needs: No Transportation Needs (12/30/2022)   PRAPARE - Administrator, Civil Service (Medical): No    Lack of Transportation (Non-Medical): No  Physical Activity: Not on file  Stress: Not on file  Social Connections: Not on file  Intimate Partner Violence: Not At Risk (12/30/2022)   Humiliation, Afraid, Rape, and Kick questionnaire    Fear of Current or Ex-Partner: No    Emotionally Abused: No    Physically Abused: No    Sexually Abused: No    Review of Systems Constitutional: Patient denies any unintentional weight loss or change in strength lntegumentary: Patient denies any rashes or pruritus Cardiovascular: Patient denies chest pain or syncope Respiratory: Patient denies shortness of breath Gastrointestinal: Patient denies nausea, vomiting, constipation, or diarrhea Musculoskeletal: Patient denies muscle cramps or weakness Neurologic: Patient denies convulsions or seizures Allergic/Immunologic: Patient denies recent allergic reaction(s) Hematologic/Lymphatic: Patient denies bleeding tendencies Endocrine: Patient denies heat/cold intolerance  GU: As per HPI.  OBJECTIVE Vitals:   03/23/23 0933  BP: (!) 142/83  Pulse: 86  Temp: 98 F (36.7 C)   There is no height or weight on file to calculate BMI.  Physical Examination Constitutional: No obvious distress; patient is non-toxic appearing  Cardiovascular: No visible lower extremity edema.  Respiratory: The patient does not have audible wheezing/stridor; respirations do not appear labored  Gastrointestinal: Abdomen non-distended Musculoskeletal: Normal ROM of UEs  Skin: No obvious rashes/open sores  Neurologic: CN 2-12 grossly intact Psychiatric: Answered questions appropriately with  normal affect  Hematologic/Lymphatic/Immunologic: No obvious bruises or sites of spontaneous bleeding  Urine microscopy: >30 WBC/hpf, 3-10 RBC/hpf, few bacteria PVR: 0 ml  ASSESSMENT Benign prostatic hyperplasia with urinary obstruction - Plan: Urinalysis, Routine w reflex microscopic, BLADDER SCAN AMB NON-IMAGING, Urine Culture  S/P TURP - Plan: Urinalysis, Routine w reflex microscopic, BLADDER SCAN AMB NON-IMAGING, Urine Culture  OAB (overactive bladder) - Plan: Urinalysis, Routine w reflex microscopic, BLADDER SCAN AMB NON-IMAGING, Urine Culture  Urge incontinence - Plan: Urinalysis, Routine w reflex microscopic, BLADDER SCAN AMB NON-IMAGING, Urine Culture  Nocturia - Plan: Urinalysis, Routine w reflex microscopic, BLADDER SCAN AMB NON-IMAGING, Urine Culture  Abnormal urinalysis - Plan: Urine Culture  We discussed the symptoms of overactive bladder (OAB), which include urinary urgency, frequency, nocturia, with or without urge incontinence.   While we may not know the exact etiology of OAB, several risk factors can be identified.  - We discussed this patient's neurogenic risk factors for OAB-type symptoms including T2DM and prior nicotine use.  - Likely exacerbated by diuretic use (Spironolactone daily & Lasix PRN).   We discussed the following management options in detail including potential benefits, risks, and side effects: Behavioral therapy: Modify fluid intake Decreasing bladder irritants (such as caffeine) Urge suppression strategies Bladder retraining / timed voiding Medication(s): - Not a safe candidate for anticholinergic medications due to risk for side effects based on patient's age and comorbidities.  - Failed Gemtesa due to side effects (diarrhea). Discussed option to trial Myrbetriq however same side effect may occur.  He does not want any invasive procedures so PTNS / Botox were not discussed. He decided to proceed with behavioral modifications including avoiding  caffeine intake.  He asked about possibly discontinuing Flomax; agreed that he can trial reducing dose to once daily for a week then discontinue if symptoms are unchanged.   Will plan for  follow up in 3 months or sooner if needed. Pt verbalized understanding and agreement. All questions were answered.  Abnormal urine result received by provider after patient departure. Will check urine culture and treat as indicated based on results.  PLAN Advised the following: 1. Urine culture. 2. Minimize caffeine intake. 3. Return in about 3 months (around 06/20/2023) for UA, PVR, & f/u with Evette Georges NP.  Orders Placed This Encounter  Procedures   Urine Culture   Urinalysis, Routine w reflex microscopic   BLADDER SCAN AMB NON-IMAGING   Total time spent caring for the patient today was over 30 minutes. This includes time spent on the date of the visit reviewing the patient's chart before the visit, time spent during the visit, and time spent after the visit on documentation. Over 50% of that time was spent in face-to-face time with this patient for direct counseling. E&M based on time and complexity of medical decision making.  It has been explained that the patient is to follow regularly with their PCP in addition to all other providers involved in their care and to follow instructions provided by these respective offices. Patient advised to contact urology clinic if any urologic-pertaining questions, concerns, new symptoms or problems arise in the interim period.  There are no Patient Instructions on file for this visit.  Electronically signed by:  Donnita Falls, FNP   03/23/23    10:28 AM

## 2023-03-23 ENCOUNTER — Ambulatory Visit (INDEPENDENT_AMBULATORY_CARE_PROVIDER_SITE_OTHER): Payer: Medicare Other | Admitting: Urology

## 2023-03-23 ENCOUNTER — Encounter: Payer: Self-pay | Admitting: Urology

## 2023-03-23 VITALS — BP 142/83 | HR 86 | Temp 98.0°F

## 2023-03-23 DIAGNOSIS — N401 Enlarged prostate with lower urinary tract symptoms: Secondary | ICD-10-CM

## 2023-03-23 DIAGNOSIS — R8290 Unspecified abnormal findings in urine: Secondary | ICD-10-CM

## 2023-03-23 DIAGNOSIS — N3281 Overactive bladder: Secondary | ICD-10-CM | POA: Diagnosis not present

## 2023-03-23 DIAGNOSIS — N3941 Urge incontinence: Secondary | ICD-10-CM | POA: Diagnosis not present

## 2023-03-23 DIAGNOSIS — R351 Nocturia: Secondary | ICD-10-CM | POA: Diagnosis not present

## 2023-03-23 DIAGNOSIS — Z9079 Acquired absence of other genital organ(s): Secondary | ICD-10-CM | POA: Diagnosis not present

## 2023-03-23 DIAGNOSIS — N138 Other obstructive and reflux uropathy: Secondary | ICD-10-CM | POA: Diagnosis not present

## 2023-03-23 LAB — URINALYSIS, ROUTINE W REFLEX MICROSCOPIC
Bilirubin, UA: NEGATIVE
Glucose, UA: NEGATIVE
Ketones, UA: NEGATIVE
Nitrite, UA: NEGATIVE
Protein,UA: NEGATIVE
Specific Gravity, UA: 1.02 (ref 1.005–1.030)
Urobilinogen, Ur: 0.2 mg/dL (ref 0.2–1.0)
pH, UA: 6 (ref 5.0–7.5)

## 2023-03-23 LAB — MICROSCOPIC EXAMINATION: WBC, UA: >30 /HPF — AB (ref 0–5)

## 2023-03-23 LAB — BLADDER SCAN AMB NON-IMAGING: Scan Result: 0

## 2023-03-25 DIAGNOSIS — E119 Type 2 diabetes mellitus without complications: Secondary | ICD-10-CM | POA: Diagnosis not present

## 2023-03-25 DIAGNOSIS — I10 Essential (primary) hypertension: Secondary | ICD-10-CM | POA: Diagnosis not present

## 2023-03-27 LAB — URINE CULTURE

## 2023-03-28 ENCOUNTER — Telehealth: Payer: Self-pay

## 2023-03-28 ENCOUNTER — Other Ambulatory Visit: Payer: Self-pay | Admitting: Urology

## 2023-03-28 DIAGNOSIS — R399 Unspecified symptoms and signs involving the genitourinary system: Secondary | ICD-10-CM

## 2023-03-28 DIAGNOSIS — R8290 Unspecified abnormal findings in urine: Secondary | ICD-10-CM

## 2023-03-28 DIAGNOSIS — N3001 Acute cystitis with hematuria: Secondary | ICD-10-CM

## 2023-03-28 MED ORDER — LEVOFLOXACIN 750 MG PO TABS: 750.0000 mg | ORAL_TABLET | Freq: Every day | ORAL | 0 refills | Status: AC

## 2023-03-28 NOTE — Progress Notes (Signed)
 Please let pt know urine culture was positive, which may be the cause of his increased LUTS. Based on the culture sensitivity report, his UTI is from a multi-drug resistant organism which significantly limits the medication options for treatment with an oral antibiotic. I have sent prescription for Levaquin 750 mg once daily x7 days. Please advise patient that taking this antibiotic may increase the hypoglycemic (blood-sugar lowering) effect of his Glimepiride (Amaryl) so he should be watchful for symptoms of that such as lightheadedness, sweating, rapid heart rate (tachycardia), and/or neurologic / psychiatric disturbances. He may want to consult with his PCP to determine if it is safe to hold the Glimepiride while taking Levaquin. Thanks.

## 2023-03-28 NOTE — Telephone Encounter (Signed)
 Tried calling patient with no answer left voiced message for patient to return call to office.

## 2023-03-28 NOTE — Telephone Encounter (Signed)
-----   Message from Donnita Falls sent at 03/28/2023  9:01 AM EST ----- Please let pt know urine culture was positive, which may be the cause of his increased LUTS. Based on the culture sensitivity report, his UTI is from a multi-drug resistant organism which significantly limits the medication options for treatment with an oral antibiotic. I have sent prescription for Levaquin 750 mg once daily x7 days. Please advise patient that taking this antibiotic may increase the hypoglycemic (blood-sugar lowering) effect of his Glimepiride (Amaryl) so he should be watchful for symptoms of that such as lightheadedness, sweating, rapid heart rate (tachycardia), and/or neurologic / psychiatric disturbances. He may want to consult with his PCP to determine if it is safe to hold the Glimepiride while taking Levaquin. Thanks.

## 2023-03-29 NOTE — Telephone Encounter (Signed)
 Pt called returning phone call from yesterday per last telephone encounter Pt voiced understanding

## 2023-03-30 ENCOUNTER — Telehealth: Payer: Self-pay

## 2023-03-30 NOTE — Telephone Encounter (Signed)
 Pt called to let us know his Rx for levaquin is causing his blood pressure to go up too high is there anything else he could possibly take ?

## 2023-03-30 NOTE — Telephone Encounter (Signed)
 Called Pt to relay message from NP:  "I looked it up and hypertension is not a recognized side effect of Levaquin. If anything it may cause hypotension. He should contact his PCP to assess for other potential causes for his elevated blood pressure."   Pt voiced understanding and was advised to contact PCP

## 2023-04-07 ENCOUNTER — Other Ambulatory Visit: Payer: Self-pay | Admitting: Cardiology

## 2023-04-10 DIAGNOSIS — R0689 Other abnormalities of breathing: Secondary | ICD-10-CM | POA: Diagnosis not present

## 2023-04-10 DIAGNOSIS — I509 Heart failure, unspecified: Secondary | ICD-10-CM | POA: Diagnosis not present

## 2023-04-10 DIAGNOSIS — Z91041 Radiographic dye allergy status: Secondary | ICD-10-CM | POA: Diagnosis not present

## 2023-04-10 DIAGNOSIS — I080 Rheumatic disorders of both mitral and aortic valves: Secondary | ICD-10-CM | POA: Diagnosis not present

## 2023-04-10 DIAGNOSIS — I10 Essential (primary) hypertension: Secondary | ICD-10-CM | POA: Diagnosis not present

## 2023-04-10 DIAGNOSIS — Z7985 Long-term (current) use of injectable non-insulin antidiabetic drugs: Secondary | ICD-10-CM | POA: Diagnosis not present

## 2023-04-10 DIAGNOSIS — I4510 Unspecified right bundle-branch block: Secondary | ICD-10-CM | POA: Diagnosis not present

## 2023-04-10 DIAGNOSIS — Z87891 Personal history of nicotine dependence: Secondary | ICD-10-CM | POA: Diagnosis not present

## 2023-04-10 DIAGNOSIS — E119 Type 2 diabetes mellitus without complications: Secondary | ICD-10-CM | POA: Diagnosis not present

## 2023-04-10 DIAGNOSIS — J810 Acute pulmonary edema: Secondary | ICD-10-CM | POA: Diagnosis not present

## 2023-04-10 DIAGNOSIS — Z7984 Long term (current) use of oral hypoglycemic drugs: Secondary | ICD-10-CM | POA: Diagnosis not present

## 2023-04-10 DIAGNOSIS — Z79899 Other long term (current) drug therapy: Secondary | ICD-10-CM | POA: Diagnosis not present

## 2023-04-10 DIAGNOSIS — J80 Acute respiratory distress syndrome: Secondary | ICD-10-CM | POA: Diagnosis not present

## 2023-04-10 DIAGNOSIS — Z880 Allergy status to penicillin: Secondary | ICD-10-CM | POA: Diagnosis not present

## 2023-04-10 DIAGNOSIS — Z951 Presence of aortocoronary bypass graft: Secondary | ICD-10-CM | POA: Diagnosis not present

## 2023-04-10 DIAGNOSIS — J90 Pleural effusion, not elsewhere classified: Secondary | ICD-10-CM | POA: Diagnosis not present

## 2023-04-10 DIAGNOSIS — R0602 Shortness of breath: Secondary | ICD-10-CM | POA: Diagnosis not present

## 2023-04-10 DIAGNOSIS — E785 Hyperlipidemia, unspecified: Secondary | ICD-10-CM | POA: Diagnosis not present

## 2023-04-10 DIAGNOSIS — J9811 Atelectasis: Secondary | ICD-10-CM | POA: Diagnosis not present

## 2023-04-10 DIAGNOSIS — Z91148 Patient's other noncompliance with medication regimen for other reason: Secondary | ICD-10-CM | POA: Diagnosis not present

## 2023-04-10 DIAGNOSIS — I5021 Acute systolic (congestive) heart failure: Secondary | ICD-10-CM | POA: Diagnosis not present

## 2023-04-10 DIAGNOSIS — A419 Sepsis, unspecified organism: Secondary | ICD-10-CM | POA: Diagnosis not present

## 2023-04-10 DIAGNOSIS — R069 Unspecified abnormalities of breathing: Secondary | ICD-10-CM | POA: Diagnosis not present

## 2023-04-10 DIAGNOSIS — I2510 Atherosclerotic heart disease of native coronary artery without angina pectoris: Secondary | ICD-10-CM | POA: Diagnosis not present

## 2023-04-10 DIAGNOSIS — J189 Pneumonia, unspecified organism: Secondary | ICD-10-CM | POA: Diagnosis not present

## 2023-04-10 DIAGNOSIS — J9601 Acute respiratory failure with hypoxia: Secondary | ICD-10-CM | POA: Diagnosis not present

## 2023-04-10 DIAGNOSIS — J9600 Acute respiratory failure, unspecified whether with hypoxia or hypercapnia: Secondary | ICD-10-CM | POA: Diagnosis not present

## 2023-04-10 DIAGNOSIS — I110 Hypertensive heart disease with heart failure: Secondary | ICD-10-CM | POA: Diagnosis not present

## 2023-04-10 DIAGNOSIS — R0902 Hypoxemia: Secondary | ICD-10-CM | POA: Diagnosis not present

## 2023-04-10 DIAGNOSIS — Z955 Presence of coronary angioplasty implant and graft: Secondary | ICD-10-CM | POA: Diagnosis not present

## 2023-04-13 ENCOUNTER — Other Ambulatory Visit: Payer: Self-pay | Admitting: Cardiology

## 2023-04-19 ENCOUNTER — Ambulatory Visit: Payer: Medicare Other | Attending: Nurse Practitioner | Admitting: Nurse Practitioner

## 2023-04-19 ENCOUNTER — Encounter: Payer: Self-pay | Admitting: Nurse Practitioner

## 2023-04-19 VITALS — BP 128/80 | HR 90 | Ht 69.0 in | Wt 191.8 lb

## 2023-04-19 DIAGNOSIS — I350 Nonrheumatic aortic (valve) stenosis: Secondary | ICD-10-CM

## 2023-04-19 DIAGNOSIS — I5020 Unspecified systolic (congestive) heart failure: Secondary | ICD-10-CM | POA: Diagnosis not present

## 2023-04-19 DIAGNOSIS — I10 Essential (primary) hypertension: Secondary | ICD-10-CM

## 2023-04-19 DIAGNOSIS — R5383 Other fatigue: Secondary | ICD-10-CM

## 2023-04-19 DIAGNOSIS — I2510 Atherosclerotic heart disease of native coronary artery without angina pectoris: Secondary | ICD-10-CM | POA: Diagnosis not present

## 2023-04-19 DIAGNOSIS — I5022 Chronic systolic (congestive) heart failure: Secondary | ICD-10-CM

## 2023-04-19 DIAGNOSIS — E785 Hyperlipidemia, unspecified: Secondary | ICD-10-CM

## 2023-04-19 MED ORDER — SPIRONOLACTONE 25 MG PO TABS: 12.5000 mg | ORAL_TABLET | Freq: Every day | ORAL | 3 refills | Status: DC

## 2023-04-19 MED ORDER — FUROSEMIDE 20 MG PO TABS: 40.0000 mg | ORAL_TABLET | Freq: Every day | ORAL | 3 refills | Status: DC

## 2023-04-19 NOTE — Patient Instructions (Signed)
 Medication Instructions:  Your physician recommends that you continue on your current medications as directed. Please refer to the Current Medication list given to you today.   Labwork: Labs to be completed 1-2 weeks at [facility], BMET and Magnesium  Testing/Procedures: None  Follow-Up: Your physician recommends that you schedule a follow-up appointment in: 4-6 weeks  Any Other Special Instructions Will Be Listed Below (If Applicable). Thank you for choosing [facility]!     If you need a refill on your cardiac medications before your next appointment, please call your pharmacy.

## 2023-04-19 NOTE — Progress Notes (Signed)
 Cardiology Office Note:  .   Date:  04/19/2023 ID:  Sean Villarreal, DOB 07-17-1940, MRN [id number] PCP: Lawerance Sabal, PA  [facility] Providers Cardiologist:  Nona Dell, MD    History of Present Illness: Sean Villarreal   Sean Villarreal is a 83 y.o. male with a PMH of HFrEF, CAD, s/p CABG in 2021, mild to moderate aortic valve stenosis, HTN, T2DM, dyslipidemia, BPH, who presents today for 6 month follow-up.   Previous hx of DES to LAD and circumflex in 2014, hx of angioplasty to circumflex d/t restenosis in 2018, 2 DES to proximal to mid circumflex in 2018, CABG (LIMA-LAD, SVG- OM) in 2021. NST 03/2022 was low risk.   Last seen by Dr. Diona Browner on October 11, 2022. Was overall doing well from a cardiac perspective.   Recent hospital stay at [facility] for acute hypoxic respiratory failure that was likely secondary to acute congestive heart failure versus community-acquired pneumonia.  Diagnosed with new onset congestive heart failure/acute pulmonary edema. BNP elevated. CXR revealed pulmonary vascular congestion and received IV Lasix.  CT of the chest without contrast revealed multifocal groundglass lower airspace opacities concerning for multifocal pneumonia.  VQ scan was ordered to rule out PE, negative for PE.  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   Today he presents for follow-up. Recovering from his hospital stay. Says he mainly notices fatigue. Compliant with his medications. Weight is stable. Denies any chest pain, shortness of breath, palpitations, syncope, presyncope, dizziness, orthopnea, PND, swelling or significant weight changes, acute bleeding, or claudication.   ROS: Negative. See HPI.   Studies Reviewed: Sean Villarreal    TTE [facility] [place] (04/11/2023): Conclusions Summary: 1.  Left ventricle: The cavity size is mildly increased.  Wall thickness is normal.  Systolic function is normal.  The close his LVEF is 25  to 30%, by visual estimate.  The diastolic function and estimated filling pressure is normal. 2.  Aortic valve: There is moderate stenosis. 3.  Mitral valve: The annulus is mildly calcified.  The leaflets are moderately thickened and moderately calcified.  There is moderate regurgitation. 4.  Right ventricle: The cavity size is normal.  Wall thickness is normal.  Systolic function is normal.  Systolic pressure is within normal range.  Lexiscan 03/2022:    The study is normal. There are no perfusion defects  The study is low risk.   No ST deviation was noted.   LV perfusion is normal.   Left ventricular function is normal. Nuclear stress EF: 67 %. The left ventricular ejection fraction is hyperdynamic (>65%). End diastolic cavity size is normal.  LHC 12/2019:  Severe calcific coronary disease involving the left anterior descending, circumflex, and left main. 40 to 50% eccentric distal left main Bulky eccentric 80% ostial to proximal LAD showing progression compared to 2020.  Moderate mid to distal diffuse LAD disease. Diffuse ostial to proximal in-stent restenosis in circumflex which has 2 layers of stent.  Moderate mid to distal circumflex 50% narrowing. Occlusion of ramus intermedius, very small and fills by left to left collaterals. Dominant right coronary with luminal irregularities but no high-grade obstruction. Normal anterior wall motion.  Estimated > EF 50%.  Inferior wall not well seen.  LVEDP is normal.   RECOMMENDATIONS:   Discontinue Plavix IV heparin Recommend two-vessel coronary bypass surgery in the setting of significant proximal LAD disease and in-stent restenosis x3 of the ostial circumflex (2 layers of stent). 2D Doppler  echocardiogram to fully assess LV function.  Physical Exam:   VS:  BP 128/80   Pulse 90   Ht 5\' 9"  (1.753 m)   Wt 191 lb 12.8 oz (87 kg)   SpO2 99%   BMI 28.32 kg/m    Wt Readings from Last 3 Encounters:  04/19/23 191 lb 12.8 oz (87 kg)  12/30/22  198 lb 6.6 oz (90 kg)  12/29/22 200 lb 9.9 oz (91 kg)    GEN: Well nourished, well developed in no acute distress NECK: No JVD; No carotid bruits CARDIAC: S1/S2, RRR, Grade 2/6 murmur, no rubs, no gallops RESPIRATORY:  Clear to auscultation without rales, wheezing or rhonchi  ABDOMEN: Soft, non-tender, non-distended EXTREMITIES:  No edema; No deformity   ASSESSMENT AND PLAN: .    HFrEF Stage C, NYHA class I-II symptoms.  EF 25 to 30% seen on recent echocardiogram during hospitalization.  Etiology unclear, however strong suspicion due to the setting of recent acute illness as he was being treated for pneumonia at the time. Euvolemic and well compensated on exam.  Continue carvedilol, Lasix, lisinopril, and spironolactone.  Will obtain BMET, proBNP, and magnesium for further evaluation. Low sodium diet, fluid restriction <2L, and daily weights encouraged. Educated to contact our office for weight gain of 2 lbs overnight or 5 lbs in one week.  If kidney function allows, plan to start SGLT2 inhibitor next.  Plan to update echocardiogram in 3 months once GDMT is at maximum titration.  CAD, s/p CABG in 2021 Stable with no anginal symptoms. No indication for ischemic evaluation.  Continue aspirin, atorvastatin, carvedilol, lisinopril, Imdur, and nitroglycerin as needed. Heart healthy diet and regular cardiovascular exercise encouraged.   Aortic valve stenosis Most recent Echo at [facility] revealed moderate aortic valve stenosis. Denies any specific symptoms. Will continue with regular surveillance.   HTN BP stable. Discussed to monitor BP at home at least 2 hours after medications and sitting for 5-10 minutes. No medication changes at this time. Heart healthy diet and regular cardiovascular exercise encouraged. Getting labs as mentioned above.   Dyslipidemia No recent labs on file.  Continue atorvastatin.  Plan to request labs at next office visit. Heart healthy diet and regular  cardiovascular exercise encouraged.   6.  Fatigue Etiology multifactorial.  Most likely due to recovering from pneumonia.  Getting lab work as mentioned above.  Continue follow-up with PCP.  Care and ED precautions discussed.    Dispo: Will provide refills per his request.  Follow-up with me/APP in 4 to 6 weeks or sooner if any changes.  Signed, Sharlene Dory, NP

## 2023-04-22 DIAGNOSIS — E119 Type 2 diabetes mellitus without complications: Secondary | ICD-10-CM | POA: Diagnosis not present

## 2023-04-22 DIAGNOSIS — I10 Essential (primary) hypertension: Secondary | ICD-10-CM | POA: Diagnosis not present

## 2023-04-27 DIAGNOSIS — I5020 Unspecified systolic (congestive) heart failure: Secondary | ICD-10-CM | POA: Diagnosis not present

## 2023-04-27 DIAGNOSIS — R790 Abnormal level of blood mineral: Secondary | ICD-10-CM | POA: Diagnosis not present

## 2023-04-28 ENCOUNTER — Other Ambulatory Visit: Payer: Self-pay | Admitting: Cardiology

## 2023-05-09 ENCOUNTER — Other Ambulatory Visit: Payer: Self-pay | Admitting: Nurse Practitioner

## 2023-05-09 DIAGNOSIS — Z85828 Personal history of other malignant neoplasm of skin: Secondary | ICD-10-CM | POA: Diagnosis not present

## 2023-05-09 DIAGNOSIS — L570 Actinic keratosis: Secondary | ICD-10-CM | POA: Diagnosis not present

## 2023-05-09 DIAGNOSIS — D485 Neoplasm of uncertain behavior of skin: Secondary | ICD-10-CM | POA: Diagnosis not present

## 2023-05-09 DIAGNOSIS — L905 Scar conditions and fibrosis of skin: Secondary | ICD-10-CM | POA: Diagnosis not present

## 2023-05-09 DIAGNOSIS — C44729 Squamous cell carcinoma of skin of left lower limb, including hip: Secondary | ICD-10-CM | POA: Diagnosis not present

## 2023-05-09 DIAGNOSIS — L821 Other seborrheic keratosis: Secondary | ICD-10-CM | POA: Diagnosis not present

## 2023-05-09 DIAGNOSIS — I5020 Unspecified systolic (congestive) heart failure: Secondary | ICD-10-CM

## 2023-05-09 MED ORDER — FUROSEMIDE 20 MG PO TABS: 60.0000 mg | ORAL_TABLET | Freq: Every day | ORAL | Status: DC

## 2023-05-12 DIAGNOSIS — I5022 Chronic systolic (congestive) heart failure: Secondary | ICD-10-CM | POA: Diagnosis not present

## 2023-05-12 DIAGNOSIS — E1165 Type 2 diabetes mellitus with hyperglycemia: Secondary | ICD-10-CM | POA: Diagnosis not present

## 2023-05-17 DIAGNOSIS — I5020 Unspecified systolic (congestive) heart failure: Secondary | ICD-10-CM | POA: Diagnosis not present

## 2023-05-18 DIAGNOSIS — C44729 Squamous cell carcinoma of skin of left lower limb, including hip: Secondary | ICD-10-CM | POA: Diagnosis not present

## 2023-05-23 ENCOUNTER — Telehealth: Payer: Self-pay | Admitting: Nurse Practitioner

## 2023-05-23 DIAGNOSIS — E876 Hypokalemia: Secondary | ICD-10-CM

## 2023-05-23 DIAGNOSIS — Z79899 Other long term (current) drug therapy: Secondary | ICD-10-CM

## 2023-05-23 DIAGNOSIS — I5020 Unspecified systolic (congestive) heart failure: Secondary | ICD-10-CM

## 2023-05-23 MED ORDER — DAPAGLIFLOZIN PROPANEDIOL 10 MG PO TABS: 10.0000 mg | ORAL_TABLET | Freq: Every day | ORAL | 3 refills | Status: DC

## 2023-05-23 NOTE — Telephone Encounter (Signed)
 Called pt due to sch change and he asked if we received his lab results yet and would like to know if they were ok.   Please give pt a call

## 2023-05-23 NOTE — Telephone Encounter (Signed)
-----   Message from Lasalle Pointer sent at 05/23/2023  4:37 PM EDT ----- Potassium is much better. Kidneys look great. Okay to start either Farxiga or Jardiance , whichever is better covered with his insurance. Would do 10 mg daily dosing and repeat a BMET in 2 weeks to evaluate his kidneys.   Thanks!   Best, Lasalle Pointer, NP

## 2023-05-23 NOTE — Telephone Encounter (Signed)
 Patient informed and verbalized understanding of plan. Farxiga 10 Mg daily sent in to pharmacy and Labs faxed to [facility]

## 2023-05-25 DIAGNOSIS — I10 Essential (primary) hypertension: Secondary | ICD-10-CM | POA: Diagnosis not present

## 2023-05-25 DIAGNOSIS — E119 Type 2 diabetes mellitus without complications: Secondary | ICD-10-CM | POA: Diagnosis not present

## 2023-06-01 DIAGNOSIS — H401122 Primary open-angle glaucoma, left eye, moderate stage: Secondary | ICD-10-CM | POA: Diagnosis not present

## 2023-06-01 DIAGNOSIS — E119 Type 2 diabetes mellitus without complications: Secondary | ICD-10-CM | POA: Diagnosis not present

## 2023-06-01 DIAGNOSIS — H43813 Vitreous degeneration, bilateral: Secondary | ICD-10-CM | POA: Diagnosis not present

## 2023-06-01 DIAGNOSIS — Z961 Presence of intraocular lens: Secondary | ICD-10-CM | POA: Diagnosis not present

## 2023-06-01 DIAGNOSIS — Z9849 Cataract extraction status, unspecified eye: Secondary | ICD-10-CM | POA: Diagnosis not present

## 2023-06-01 DIAGNOSIS — H1045 Other chronic allergic conjunctivitis: Secondary | ICD-10-CM | POA: Diagnosis not present

## 2023-06-07 DIAGNOSIS — I5020 Unspecified systolic (congestive) heart failure: Secondary | ICD-10-CM | POA: Diagnosis not present

## 2023-06-07 DIAGNOSIS — Z79810 Long term (current) use of selective estrogen receptor modulators (SERMs): Secondary | ICD-10-CM | POA: Diagnosis not present

## 2023-06-07 DIAGNOSIS — E876 Hypokalemia: Secondary | ICD-10-CM | POA: Diagnosis not present

## 2023-06-14 ENCOUNTER — Ambulatory Visit: Admitting: Nurse Practitioner

## 2023-06-15 ENCOUNTER — Ambulatory Visit: Attending: Nurse Practitioner | Admitting: Nurse Practitioner

## 2023-06-15 ENCOUNTER — Encounter: Payer: Self-pay | Admitting: Nurse Practitioner

## 2023-06-15 VITALS — BP 124/74 | HR 74 | Ht 69.0 in | Wt 192.0 lb

## 2023-06-15 DIAGNOSIS — I2510 Atherosclerotic heart disease of native coronary artery without angina pectoris: Secondary | ICD-10-CM

## 2023-06-15 DIAGNOSIS — I5020 Unspecified systolic (congestive) heart failure: Secondary | ICD-10-CM | POA: Diagnosis not present

## 2023-06-15 DIAGNOSIS — E785 Hyperlipidemia, unspecified: Secondary | ICD-10-CM

## 2023-06-15 DIAGNOSIS — I350 Nonrheumatic aortic (valve) stenosis: Secondary | ICD-10-CM

## 2023-06-15 DIAGNOSIS — I10 Essential (primary) hypertension: Secondary | ICD-10-CM

## 2023-06-15 MED ORDER — ENTRESTO 24-26 MG PO TABS: 1.0000 | ORAL_TABLET | Freq: Two times a day (BID) | ORAL | 5 refills | Status: DC

## 2023-06-15 MED ORDER — DAPAGLIFLOZIN PROPANEDIOL 10 MG PO TABS: 10.0000 mg | ORAL_TABLET | Freq: Every day | ORAL | 3 refills | Status: DC

## 2023-06-15 MED ORDER — POTASSIUM CHLORIDE CRYS ER 20 MEQ PO TBCR
20.0000 meq | EXTENDED_RELEASE_TABLET | Freq: Every day | ORAL | 1 refills | Status: DC
Start: 2023-06-15 — End: 2023-06-17

## 2023-06-15 NOTE — Patient Instructions (Addendum)
 Medication Instructions:  Your physician has recommended you make the following change in your medication:  Please start Potassium 20 MeQ daily  Stop Lisinopril  3 days before starting Entresto  Start Entresto 24 Mg- 26 Mg twice daily(Sent to [facility])   Labwork: Call 2 weeks after taking Entresto and we will order a BMET   Testing/Procedures: None   Follow-Up: Your physician recommends that you schedule a follow-up appointment in: 2-3 months   Any Other Special Instructions Will Be Listed Below (If Applicable).  If you need a refill on your cardiac medications before your next appointment, please call your pharmacy.

## 2023-06-15 NOTE — Progress Notes (Unsigned)
 Cardiology Office Note:  .   Date:  06/15/2023 ID:  Harlin Life, DOB 1940-03-04, MRN [id number] PCP: Levada Raymond, PA  [facility] Providers Cardiologist:  Teddie Favre, MD    History of Present Illness: Aaron Aas   BELDON NOWLING is a 83 y.o. male with a PMH of HFrEF, CAD, s/p CABG in 2021, mild to moderate aortic valve stenosis, HTN, T2DM, dyslipidemia, BPH, who presents today for 4 to 6-week follow-up.   Previous hx of DES to LAD and circumflex in 2014, hx of angioplasty to circumflex d/t restenosis in 2018, 2 DES to proximal to mid circumflex in 2018, CABG (LIMA-LAD, SVG- OM) in 2021. NST 03/2022 was low risk.   Last seen by Dr. Londa Rival on October 11, 2022. Was overall doing well from a cardiac perspective.   Recent hospital stay at [facility] for acute hypoxic respiratory failure that was likely secondary to acute congestive heart failure versus community-acquired pneumonia.  Diagnosed with new onset congestive heart failure/acute pulmonary edema. BNP elevated. CXR revealed pulmonary vascular congestion and received IV Lasix .  CT of the chest without contrast revealed multifocal groundglass lower airspace opacities concerning for multifocal pneumonia.  VQ scan was ordered to rule out PE, negative for PE.  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   04/19/2023 - Today he presents for follow-up. Recovering from his hospital stay. Says he mainly notices fatigue. Compliant with his medications. Weight is stable. Denies any chest pain, shortness of breath, palpitations, syncope, presyncope, dizziness, orthopnea, PND, swelling or significant weight changes, acute bleeding, or claudication.  06/15/2023 - Presents today for follow-up.  He is doing well.  Tolerating his medications well and he is compliant with his medical therapy. Denies any chest pain, shortness of breath, palpitations, syncope, presyncope, dizziness,  orthopnea, PND, swelling or significant weight changes, acute bleeding, or claudication.   ROS: Negative. See HPI.   SH: In his free time he enjoys being outdoors and mows yards.  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  Studies Reviewed: Aaron Aas    EKG: EKG is not ordered today.  TTE [facility] [place] (04/11/2023): Conclusions Summary: 1.  Left ventricle: The cavity size is mildly increased.  Wall thickness is normal.  Systolic function is normal.  The close his LVEF is 25 to 30%, by visual estimate.  The diastolic function and estimated filling pressure is normal. 2.  Aortic valve: There is moderate stenosis. 3.  Mitral valve: The annulus is mildly calcified.  The leaflets are moderately thickened and moderately calcified.  There is moderate regurgitation. 4.  Right ventricle: The cavity size is normal.  Wall thickness is normal.  Systolic function is normal.  Systolic pressure is within normal range.  Lexiscan  03/2022:    The study is normal. There are no perfusion defects  The study is low risk.   No ST deviation was noted.   LV perfusion is normal.   Left ventricular function is normal. Nuclear stress EF: 67 %. The left ventricular ejection fraction is hyperdynamic (>65%). End diastolic cavity size is normal.  LHC 12/2019:  Severe calcific coronary disease involving the left anterior descending, circumflex, and left main. 40 to 50% eccentric distal left main Bulky eccentric 80% ostial to proximal LAD showing progression compared to 2020.  Moderate mid to distal diffuse LAD disease. Diffuse ostial to proximal in-stent restenosis in circumflex which has 2 layers of stent.  Moderate mid to distal  circumflex 50% narrowing. Occlusion of ramus intermedius, very small and fills by left to left collaterals. Dominant right coronary with luminal irregularities but no high-grade obstruction. Normal anterior wall motion.  Estimated > EF 50%.  Inferior wall not well seen.  LVEDP is  normal.   RECOMMENDATIONS:   Discontinue Plavix  IV heparin  Recommend two-vessel coronary bypass surgery in the setting of significant proximal LAD disease and in-stent restenosis x3 of the ostial circumflex (2 layers of stent). 2D Doppler echocardiogram to fully assess LV function.  Physical Exam:   VS:  BP 124/74 (BP Location: Right Arm, Cuff Size: Normal)   Pulse 74   Ht 5\' 9"  (1.753 m)   Wt 192 lb (87.1 kg)   SpO2 99%   BMI 28.35 kg/m    Wt Readings from Last 3 Encounters:  06/15/23 192 lb (87.1 kg)  04/19/23 191 lb 12.8 oz (87 kg)  12/30/22 198 lb 6.6 oz (90 kg)    GEN: Well nourished, well developed in no acute distress NECK: No JVD; No carotid bruits CARDIAC: S1/S2, RRR, Grade 2/6 murmur, no rubs, no gallops RESPIRATORY:  Clear to auscultation without rales, wheezing or rhonchi  ABDOMEN: Soft, non-tender, non-distended EXTREMITIES:  No edema; No deformity   ASSESSMENT AND PLAN: .    HFrEF Stage C, NYHA class I-II symptoms.  EF 25 to 30% seen on most recent echocardiogram during hospitalization.  Etiology unclear, however strong suspicion due to the setting of recent acute illness as he was being treated for pneumonia at the time. Euvolemic and well compensated on exam.  Continue carvedilol , Lasix , lisinopril , and spironolactone .  Discussed GDMT titration and will stop lisinopril  and start Entresto.  Discussed about holding time of lisinopril /washout prior to starting Entresto.  He verbalized understanding.  Low sodium diet, fluid restriction <2L, and daily weights encouraged. Educated to contact our office for weight gain of 2 lbs overnight or 5 lbs in one week.  Plan to update echocardiogram at the next office visit once GDMT is at maximum titration.  He will let us  know when he has been taking Entresto at least 2 weeks and we will order BMET at that time to check his kidney function.  Will also start potassium 20 mill equivalent daily.  I recommended to recheck a potassium  level in 1 week, patient declines as he will need lab work after starting Entresto.  CAD, s/p CABG in 2021 Stable with no anginal symptoms. No indication for ischemic evaluation.  Continue aspirin , atorvastatin , carvedilol , Imdur , and nitroglycerin  as needed.  Stopping lisinopril  and switching to low-dose Entresto as noted above.  Heart healthy diet and regular cardiovascular exercise encouraged.   Aortic valve stenosis Most recent Echo at [facility] revealed moderate aortic valve stenosis. Denies any specific symptoms. Will continue with regular surveillance.   HTN BP stable. Discussed to monitor BP at home at least 2 hours after medications and sitting for 5-10 minutes. No medication changes at this time besides what is noted above. Heart healthy diet and regular cardiovascular exercise encouraged. Getting labs as mentioned above.   Dyslipidemia No recent labs on file.  Continue atorvastatin .  Plan to request labs at next office visit. Heart healthy diet and regular cardiovascular exercise encouraged.      Dispo: Will provide refills per his request.  Follow-up with me/APP in 2-3 months or sooner if any changes.  Signed, Lasalle Pointer, NP

## 2023-06-17 ENCOUNTER — Other Ambulatory Visit: Payer: Self-pay | Admitting: Nurse Practitioner

## 2023-06-17 ENCOUNTER — Ambulatory Visit: Payer: Self-pay | Admitting: Nurse Practitioner

## 2023-06-17 MED ORDER — DAPAGLIFLOZIN PROPANEDIOL 10 MG PO TABS: 10.0000 mg | ORAL_TABLET | Freq: Every day | ORAL | 1 refills | Status: DC

## 2023-06-17 MED ORDER — POTASSIUM CHLORIDE CRYS ER 20 MEQ PO TBCR: 20.0000 meq | EXTENDED_RELEASE_TABLET | Freq: Every day | ORAL | 1 refills | Status: DC

## 2023-06-21 ENCOUNTER — Ambulatory Visit: Payer: Medicare Other | Admitting: Urology

## 2023-06-24 DIAGNOSIS — E119 Type 2 diabetes mellitus without complications: Secondary | ICD-10-CM | POA: Diagnosis not present

## 2023-07-04 ENCOUNTER — Telehealth: Payer: Self-pay | Admitting: Nurse Practitioner

## 2023-07-04 DIAGNOSIS — I5020 Unspecified systolic (congestive) heart failure: Secondary | ICD-10-CM

## 2023-07-04 DIAGNOSIS — E876 Hypokalemia: Secondary | ICD-10-CM

## 2023-07-04 NOTE — Telephone Encounter (Signed)
 Pt called in stating he was told to have lab work 2 weeks after starting Entresto . He states he started on 06/21/23 and he would like to have his labs drawn tomorrow or Wednesday at [facility]. Please advise if order can be sent.

## 2023-07-04 NOTE — Telephone Encounter (Signed)
 Patient informed and verbalized understanding of plan. Labs sent to [facility]

## 2023-07-05 DIAGNOSIS — E875 Hyperkalemia: Secondary | ICD-10-CM | POA: Diagnosis not present

## 2023-07-20 DIAGNOSIS — N3281 Overactive bladder: Secondary | ICD-10-CM | POA: Insufficient documentation

## 2023-07-20 NOTE — Progress Notes (Signed)
 Name: Sean Villarreal DOB: Apr 01, 1940 MRN: [id number]  History of Present Illness: Mr. Tulloch is a 83 y.o. male who presents today for follow up visit at [facility].  Relevant History includes: 1. 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 - Taking Flomax  0.4 mg twice daily as prescribed by PCP.  - 12/30/2022: Underwent TURP by Dr. Sherrilee. Pathology benign.  2. OAB with urinary frequency, nocturia, urgency, and urge incontinence. - Gemtesa  caused diarrhea. 3. Kidney stone(s).  At last visit on 03/23/2023: - Reported ongoing urinary frequency, nocturia, urgency, and urge incontinence but symptoms improving even after discontinuing Gemtesa .  - Urine culture positive for Enterobacter cloacae complex (MDRO). Treated with Levaquin .  Today: He reports that since starting Lasix  for CHF in March 2025 his urinary frequency has been significant. Reports mild occasional urge incontinence; wears protection (diapers) only when he leaves the house.  He denies recent suspected stone migration / passage. He denies acute flank pain / abdominal pain. He denies dysuria, gross hematuria, straining to void, or sensations of incomplete emptying.  He reports mild irritation of his foreskin; reports that he is uncircumcised. States that he cleanses under his foreskin gently with soap and water  during his daily shower. Reports prior successful treatment with a topical cream as prescribed elsewhere for what sounds like balanitis.     Medications: Current Outpatient Medications  Medication Sig Dispense Refill   aspirin  EC 81 MG tablet Take 81 mg by mouth in the morning. Swallow whole.     atorvastatin  (LIPITOR ) 80 MG tablet Take 80 mg by mouth at bedtime.     carvedilol  (COREG ) 25 MG tablet Take 1 tablet (25 mg total) by mouth 2 (two) times daily with a meal. 60 tablet 11   dapagliflozin  propanediol (FARXIGA ) 10 MG TABS tablet Take 1 tablet (10 mg total) by mouth daily before breakfast.  90 tablet 1   fluticasone  (FLONASE ) 50 MCG/ACT nasal spray Place 1 spray into both nostrils daily as needed for allergies or rhinitis.     furosemide  (LASIX ) 20 MG tablet Take 3 tablets (60 mg total) by mouth daily.     glimepiride  (AMARYL ) 1 MG tablet Take 1 mg by mouth daily with breakfast.     isosorbide  mononitrate (IMDUR ) 30 MG 24 hr tablet TAKE 1 TABLET BY MOUTH DAILY 90 tablet 3   latanoprost  (XALATAN ) 0.005 % ophthalmic solution Place 1 drop into the left eye at bedtime.     Magnesium  400 MG TABS Take 1 tablet by mouth daily. 90 tablet 3   metFORMIN  (GLUCOPHAGE ) 1000 MG tablet Take 1,000 mg by mouth 2 (two) times daily with a meal.     nitroGLYCERIN  (NITROSTAT ) 0.4 MG SL tablet Place 0.4 mg under the tongue every 5 (five) minutes x 3 doses as needed for chest pain.     potassium chloride  SA (KLOR-CON  M20) 20 MEQ tablet Take 1 tablet (20 mEq total) by mouth daily. 90 tablet 1   sacubitril-valsartan (ENTRESTO ) 24-26 MG Take 1 tablet by mouth 2 (two) times daily. 60 tablet 5   SIMBRINZA 1-0.2 % SUSP Place 1 drop into the left eye 2 (two) times daily.     tamsulosin  (FLOMAX ) 0.4 MG CAPS capsule Take 0.4 mg by mouth 2 (two) times daily.     TRULICITY 0.75 MG/0.5ML SOPN Inject 0.75 mg into the skin once a week.     No current facility-administered medications for this visit.    Allergies: Allergies  Allergen Reactions   Contrast  Media [Iodinated Contrast Media] Hives and Other (See Comments)    Had welts on arm, and kidney issues after given dye in the past   Penicillins Other (See Comments)    Passed out as a child Has patient had a PCN reaction causing immediate rash, facial/tongue/throat swelling, SOB or lightheadedness with hypotension: Unk Has patient had a PCN reaction causing severe rash involving mucus membranes or skin necrosis: Unk Has patient had a PCN reaction that required hospitalization: Unk Has patient had a PCN reaction occurring within the last 10 years: No If all  of the above answers are NO, then may proceed with Cephalosporin use.    Jardiance  [Empagliflozin ] Diarrhea    Past Medical History:  Diagnosis Date   Aortic stenosis    a. 03/2022 Echo: EF 60-65%, no rwma, mild LVH, nl RV fxn, mild-mod AS.   Arthritis    Basal cell carcinoma    Coronary artery disease    a. DES to LAD and LCx April 2014 b. PTCA ostial circumflex 03/2016 due to ISR c.  9/18 PCI/DESx2 osital Lcx (ISR), p/mLCx  d. s/p CABG on 01/24/2020 with LIMA-LAD and SVG-OM.   Essential hypertension    GERD (gastroesophageal reflux disease)    Hematuria    History of blood transfusion 09/2012   History of kidney stones    Hyperlipidemia LDL goal <70    NSTEMI (non-ST elevated myocardial infarction) (HCC) 08/2012   Post-op Afib (HCC)    a. 12/2019-01/2020 following CABG. Briefly on amio.   Type II diabetes mellitus (HCC)    Past Surgical History:  Procedure Laterality Date   BASAL CELL CARCINOMA EXCISION     right cheek; both shoulders   CARDIAC CATHETERIZATION     CATARACT EXTRACTION W/ INTRAOCULAR LENS  IMPLANT, BILATERAL Bilateral    COLONOSCOPY N/A 03/19/2016   Procedure: COLONOSCOPY;  Surgeon: Claudis RAYMOND Rivet, MD;  Location: [facility];  Service: Endoscopy;  Laterality: N/A;  9:15   CORONARY ARTERY BYPASS GRAFT N/A 01/24/2020   Procedure: CORONARY ARTERY BYPASS GRAFTING (CABG), ON PUMP, TIMES TWO, USING LEFT INTERNAL MAMMARY ARTERY AND ENDOSCOPICALLY HARVESTED RIGHT GREATER SAPHENOUS VEIN;  Surgeon: Kerrin Elspeth BROCKS, MD;  Location: [facility] OR;  Service: Open Heart Surgery;  Laterality: N/A;   CORONARY BALLOON ANGIOPLASTY N/A 04/15/2016   Procedure: Coronary Balloon Angioplasty;  Surgeon: Peter M Swaziland, MD;  Location: [facility] INVASIVE CV LAB;  Service: Cardiovascular;  Laterality: N/A;   CORONARY STENT INTERVENTION N/A 10/11/2016   Procedure: CORONARY STENT INTERVENTION;  Surgeon: Dann Candyce RAMAN, MD;  Location: [facility] INVASIVE CV LAB;  Service: Cardiovascular;  Laterality:  N/A;   CYSTOSCOPY N/A 12/30/2022   Procedure: CYSTOSCOPY;  Surgeon: Sherrilee Belvie CROME, MD;  Location: [facility] ORS;  Service: Urology;  Laterality: N/A;   CYSTOSCOPY W/ URETEROSCOPY W/ LITHOTRIPSY  10/2012   /notes 11/10/2012   CYSTOSCOPY WITH STENT PLACEMENT  08/2012; 09/2012   EYE SURGERY Left ~ 02/2016   for crinkled lens   INGUINAL HERNIA REPAIR Left 12/24/2020   Procedure: HERNIA REPAIR INGUINAL WITH MESH;  Surgeon: Kallie Manuelita BROCKS, MD;  Location: [facility] OR[facility];  Service: General;  Laterality: Left;   LEFT HEART CATH AND CORONARY ANGIOGRAPHY N/A 04/15/2016   Procedure: Left Heart Cath and Coronary Angiography;  Surgeon: Peter M Swaziland, MD;  Location: [facility] INVASIVE CV LAB;  Service: Cardiovascular;  Laterality: N/A;   LEFT HEART CATH AND CORONARY ANGIOGRAPHY N/A 10/11/2016   Procedure: LEFT HEART CATH AND CORONARY ANGIOGRAPHY;  Surgeon: Dann Candyce RAMAN,  MD;  Location: [facility] INVASIVE CV LAB;  Service: Cardiovascular;  Laterality: N/A;   LEFT HEART CATH AND CORONARY ANGIOGRAPHY N/A 03/31/2018   Procedure: LEFT HEART CATH AND CORONARY ANGIOGRAPHY;  Surgeon: Burnard Debby LABOR, MD;  Location: [facility] INVASIVE CV LAB;  Service: Cardiovascular;  Laterality: N/A;   LEFT HEART CATH AND CORONARY ANGIOGRAPHY N/A 01/21/2020   Procedure: LEFT HEART CATH AND CORONARY ANGIOGRAPHY;  Surgeon: Claudene Victory ORN, MD;  Location: [facility] INVASIVE CV LAB;  Service: Cardiovascular;  Laterality: N/A;   LEFT HEART CATHETERIZATION WITH CORONARY ANGIOGRAM N/A 05/05/2012   Procedure: LEFT HEART CATHETERIZATION WITH CORONARY ANGIOGRAM;  Surgeon: Lonni JONETTA Cash, MD;  Location: [facility] CATH LAB;  Service: Cardiovascular;  Laterality: N/A;   LEFT HEART CATHETERIZATION WITH CORONARY ANGIOGRAM N/A 05/15/2012   Procedure: LEFT HEART CATHETERIZATION WITH CORONARY ANGIOGRAM;  Surgeon: Lonni JONETTA Cash, MD;  Location: [facility] CATH LAB;  Service: Cardiovascular;  Laterality: N/A;   TEE WITHOUT CARDIOVERSION N/A 01/24/2020   Procedure: TRANSESOPHAGEAL  ECHOCARDIOGRAM (TEE);  Surgeon: Kerrin Elspeth BROCKS, MD;  Location: [facility] OR;  Service: Open Heart Surgery;  Laterality: N/A;   TONSILLECTOMY  1948   TRANSURETHRAL RESECTION OF PROSTATE N/A 12/30/2022   Procedure: TRANSURETHRAL RESECTION OF THE PROSTATE (TURP);  Surgeon: Sherrilee Belvie CROME, MD;  Location: [facility];  Service: Urology;  Laterality: N/A;   Family History  Problem Relation Age of Onset   Heart disease Mother    Heart failure Mother    Heart disease Father    Heart attack Father    Colon cancer Neg Hx    Social History   Socioeconomic History   Marital status: Widowed    Spouse name: Not on file   Number of children: Not on file   Years of education: Not on file   Highest education level: Not on file  Occupational History   Not on file  Tobacco Use   Smoking status: Former    Current packs/day: 0.00    Average packs/day: 0.8 packs/day for 30.0 years (22.5 ttl pk-yrs)    Types: Cigarettes    Start date: 01/25/1982    Quit date: 11/05/1988    Years since quitting: 34.7   Smokeless tobacco: Never  Vaping Use   Vaping status: Never Used  Substance and Sexual Activity   Alcohol  use: Not Currently    Alcohol /week: 0.0 standard drinks of alcohol    Drug use: No   Sexual activity: Not Currently  Other Topics Concern   Not on file  Social History Narrative   Not on file   Social Drivers of Health   Financial Resource Strain: Not on file  Food Insecurity: No Food Insecurity (12/30/2022)   Hunger Vital Sign    Worried About Running Out of Food in the Last Year: Never true    Ran Out of Food in the Last Year: Never true  Transportation Needs: No Transportation Needs (12/30/2022)   PRAPARE - Administrator, Civil Service (Medical): No    Lack of Transportation (Non-Medical): No  Physical Activity: Not on file  Stress: Not on file  Social Connections: Not on file  Intimate Partner Violence: Not At Risk (12/30/2022)   Humiliation, Afraid, Rape, and Kick  questionnaire    Fear of Current or Ex-Partner: No    Emotionally Abused: No    Physically Abused: No    Sexually Abused: No    Review of Systems Constitutional: Patient denies any unintentional weight loss or change in strength lntegumentary: Patient denies  any rashes or pruritus Cardiovascular: Patient denies chest pain or syncope Respiratory: Patient denies shortness of breath Gastrointestinal: Patient denies nausea, vomiting, constipation, or diarrhea  Musculoskeletal: Patient denies muscle cramps or weakness Neurologic: Patient denies convulsions or seizures Allergic/Immunologic: Patient denies recent allergic reaction(s) Hematologic/Lymphatic: Patient denies bleeding tendencies Endocrine: Patient denies heat/cold intolerance  GU: As per HPI.  OBJECTIVE There were no vitals filed for this visit. There is no height or weight on file to calculate BMI.  Physical Examination Constitutional: No obvious distress; patient is non-toxic appearing  Cardiovascular: No visible lower extremity edema.  Respiratory: The patient does not have audible wheezing/stridor; respirations do not appear labored  Gastrointestinal: Abdomen non-distended Musculoskeletal: Normal ROM of UEs  Skin: No obvious rashes/open sores  Neurologic: CN 2-12 grossly intact Psychiatric: Answered questions appropriately with normal affect  Hematologic/Lymphatic/Immunologic: No obvious bruises or sites of spontaneous bleeding  Pelvic exam: Patient declined  UA: negative aside from glucosuria (secondary to Farxiga  use) PVR: 5 ml  ASSESSMENT Benign prostatic hyperplasia with urinary obstruction - Plan: Urinalysis, Routine w reflex microscopic, BLADDER SCAN AMB NON-IMAGING  OAB (overactive bladder)  Nephrolithiasis  1. BPH with BOO & LUTS. Well managed with Flomax  0.4 mg twice daily as prescribed by PCP.   2. OAB with urinary frequency, nocturia, urgency, and urge incontinence. Manageable at this time per  patient.   3. Kidney stone(s). Offered imaging for stone surveillance; patient declined and is aware to notify provider if stone symptoms occur at any point.  4. History of balanitis. He declined pelvic exam today. Discussed daily gentle cleansing under foreskin with soap and water .   We agreed to plan for follow up in 1 year or sooner if needed. Patient verbalized understanding of and agreement with current plan. All questions were answered.  PLAN Advised the following: 1. Continue Flomax  (Tamsulosin ) 0.4 mg twice per day. 2. Return in about 1 year (around 07/21/2024) for BPH, OAB, with UA & PVR.  Orders Placed This Encounter  Procedures   Urinalysis, Routine w reflex microscopic   BLADDER SCAN AMB NON-IMAGING   Total time spent caring for the patient today was over 30 minutes. This includes time spent on the date of the visit reviewing the patient's chart before the visit, time spent during the visit, and time spent after the visit on documentation. Over 50% of that time was spent in face-to-face time with this patient for direct counseling. E&M based on time and complexity of medical decision making.  It has been explained that the patient is to follow regularly with their PCP in addition to all other providers involved in their care and to follow instructions provided by these respective offices. Patient advised to contact urology clinic if any urologic-pertaining questions, concerns, new symptoms or problems arise in the interim period.  There are no Patient Instructions on file for this visit.  Electronically signed by:  Lauraine JAYSON Oz, FNP   07/22/23    9:49 AM

## 2023-07-22 ENCOUNTER — Ambulatory Visit (INDEPENDENT_AMBULATORY_CARE_PROVIDER_SITE_OTHER): Admitting: Urology

## 2023-07-22 ENCOUNTER — Encounter: Payer: Self-pay | Admitting: Urology

## 2023-07-22 DIAGNOSIS — N138 Other obstructive and reflux uropathy: Secondary | ICD-10-CM

## 2023-07-22 DIAGNOSIS — N3281 Overactive bladder: Secondary | ICD-10-CM

## 2023-07-22 DIAGNOSIS — N401 Enlarged prostate with lower urinary tract symptoms: Secondary | ICD-10-CM

## 2023-07-22 DIAGNOSIS — N200 Calculus of kidney: Secondary | ICD-10-CM

## 2023-07-22 LAB — URINALYSIS, ROUTINE W REFLEX MICROSCOPIC
Bilirubin, UA: NEGATIVE
Ketones, UA: NEGATIVE
Leukocytes,UA: NEGATIVE
Nitrite, UA: NEGATIVE
Protein,UA: NEGATIVE
RBC, UA: NEGATIVE
Specific Gravity, UA: 1.01 (ref 1.005–1.030)
Urobilinogen, Ur: 0.2 mg/dL (ref 0.2–1.0)
pH, UA: 7.5 (ref 5.0–7.5)

## 2023-07-22 LAB — BLADDER SCAN AMB NON-IMAGING: Scan Result: 5

## 2023-07-22 NOTE — Progress Notes (Signed)
 Bladder Scan completed today.  Patient can void prior to the bladder scan. Bladder scan result: 5  Performed By: Melvenia Stabs. CMA  Additional notes-

## 2023-07-25 DIAGNOSIS — E119 Type 2 diabetes mellitus without complications: Secondary | ICD-10-CM | POA: Diagnosis not present

## 2023-07-25 DIAGNOSIS — I10 Essential (primary) hypertension: Secondary | ICD-10-CM | POA: Diagnosis not present

## 2023-08-11 DIAGNOSIS — E7801 Familial hypercholesterolemia: Secondary | ICD-10-CM | POA: Diagnosis not present

## 2023-08-11 DIAGNOSIS — I10 Essential (primary) hypertension: Secondary | ICD-10-CM | POA: Diagnosis not present

## 2023-08-11 DIAGNOSIS — E119 Type 2 diabetes mellitus without complications: Secondary | ICD-10-CM | POA: Diagnosis not present

## 2023-08-11 DIAGNOSIS — E1165 Type 2 diabetes mellitus with hyperglycemia: Secondary | ICD-10-CM | POA: Diagnosis not present

## 2023-08-11 DIAGNOSIS — Z1329 Encounter for screening for other suspected endocrine disorder: Secondary | ICD-10-CM | POA: Diagnosis not present

## 2023-08-17 ENCOUNTER — Other Ambulatory Visit: Payer: Self-pay

## 2023-08-17 MED ORDER — POTASSIUM CHLORIDE CRYS ER 20 MEQ PO TBCR: 20.0000 meq | EXTENDED_RELEASE_TABLET | Freq: Every day | ORAL | 3 refills | Status: DC

## 2023-08-18 DIAGNOSIS — Z0001 Encounter for general adult medical examination with abnormal findings: Secondary | ICD-10-CM | POA: Diagnosis not present

## 2023-08-18 DIAGNOSIS — Z23 Encounter for immunization: Secondary | ICD-10-CM | POA: Diagnosis not present

## 2023-08-18 DIAGNOSIS — I10 Essential (primary) hypertension: Secondary | ICD-10-CM | POA: Diagnosis not present

## 2023-08-18 DIAGNOSIS — Z6829 Body mass index (BMI) 29.0-29.9, adult: Secondary | ICD-10-CM | POA: Diagnosis not present

## 2023-08-18 DIAGNOSIS — I5022 Chronic systolic (congestive) heart failure: Secondary | ICD-10-CM | POA: Diagnosis not present

## 2023-08-18 DIAGNOSIS — E1165 Type 2 diabetes mellitus with hyperglycemia: Secondary | ICD-10-CM | POA: Diagnosis not present

## 2023-08-25 DIAGNOSIS — I10 Essential (primary) hypertension: Secondary | ICD-10-CM | POA: Diagnosis not present

## 2023-08-25 DIAGNOSIS — E119 Type 2 diabetes mellitus without complications: Secondary | ICD-10-CM | POA: Diagnosis not present

## 2023-08-30 DIAGNOSIS — L570 Actinic keratosis: Secondary | ICD-10-CM | POA: Diagnosis not present

## 2023-08-30 DIAGNOSIS — L905 Scar conditions and fibrosis of skin: Secondary | ICD-10-CM | POA: Diagnosis not present

## 2023-08-30 DIAGNOSIS — D485 Neoplasm of uncertain behavior of skin: Secondary | ICD-10-CM | POA: Diagnosis not present

## 2023-08-30 DIAGNOSIS — Z85828 Personal history of other malignant neoplasm of skin: Secondary | ICD-10-CM | POA: Diagnosis not present

## 2023-08-30 DIAGNOSIS — L821 Other seborrheic keratosis: Secondary | ICD-10-CM | POA: Diagnosis not present

## 2023-08-30 DIAGNOSIS — D0461 Carcinoma in situ of skin of right upper limb, including shoulder: Secondary | ICD-10-CM | POA: Diagnosis not present

## 2023-08-30 DIAGNOSIS — D1801 Hemangioma of skin and subcutaneous tissue: Secondary | ICD-10-CM | POA: Diagnosis not present

## 2023-09-15 ENCOUNTER — Ambulatory Visit: Attending: Nurse Practitioner | Admitting: Nurse Practitioner

## 2023-09-15 ENCOUNTER — Encounter: Payer: Self-pay | Admitting: Nurse Practitioner

## 2023-09-15 VITALS — BP 112/60 | HR 72 | Ht 69.0 in | Wt 194.8 lb

## 2023-09-15 DIAGNOSIS — I38 Endocarditis, valve unspecified: Secondary | ICD-10-CM | POA: Insufficient documentation

## 2023-09-15 DIAGNOSIS — I10 Essential (primary) hypertension: Secondary | ICD-10-CM | POA: Diagnosis not present

## 2023-09-15 DIAGNOSIS — I2510 Atherosclerotic heart disease of native coronary artery without angina pectoris: Secondary | ICD-10-CM | POA: Insufficient documentation

## 2023-09-15 DIAGNOSIS — I350 Nonrheumatic aortic (valve) stenosis: Secondary | ICD-10-CM | POA: Diagnosis not present

## 2023-09-15 DIAGNOSIS — I5020 Unspecified systolic (congestive) heart failure: Secondary | ICD-10-CM | POA: Insufficient documentation

## 2023-09-15 DIAGNOSIS — Z79899 Other long term (current) drug therapy: Secondary | ICD-10-CM | POA: Diagnosis not present

## 2023-09-15 DIAGNOSIS — E785 Hyperlipidemia, unspecified: Secondary | ICD-10-CM | POA: Diagnosis not present

## 2023-09-15 MED ORDER — SPIRONOLACTONE 25 MG PO TABS: 12.5000 mg | ORAL_TABLET | Freq: Every day | ORAL | 1 refills | Status: DC

## 2023-09-15 MED ORDER — FUROSEMIDE 20 MG PO TABS: 60.0000 mg | ORAL_TABLET | Freq: Every day | ORAL | 2 refills | Status: DC

## 2023-09-15 NOTE — Progress Notes (Signed)
 Cardiology Office Note:  .   Date:  09/15/2023 ID:  Sean Villarreal, DOB 08-11-1940, MRN [id number] PCP: Sean Bolt, PA  [facility] Providers Cardiologist:  Jayson Sierras, MD    History of Present Illness: Sean   [name] is a 83 y.o. male with a PMH of HFrEF, CAD, s/p CABG in 2021, mild to moderate aortic valve stenosis, HTN, T2DM, dyslipidemia, BPH, who presents today for 2-3 month follow-up.   Previous hx of DES to LAD and circumflex in 2014, hx of angioplasty to circumflex d/t restenosis in 2018, 2 DES to proximal to mid circumflex in 2018, CABG (LIMA-LAD, SVG- OM) in 2021. NST 03/2022 was low risk.   Last seen by Dr. Sierras on October 11, 2022. Was overall doing well from a cardiac perspective.   Recent hospital stay at [facility] for acute hypoxic respiratory failure that was likely secondary to acute congestive heart failure versus community-acquired pneumonia.  Diagnosed with new onset congestive heart failure/acute pulmonary edema. BNP elevated. CXR revealed pulmonary vascular congestion and received IV Lasix .  CT of the chest without contrast revealed multifocal groundglass lower airspace opacities concerning for multifocal pneumonia.  VQ scan was ordered to rule out PE, negative for PE.  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   04/19/2023 - Today he presents for follow-up. Recovering from his hospital stay. Says he mainly notices fatigue. Compliant with his medications. Weight is stable. Denies any chest pain, shortness of breath, palpitations, syncope, presyncope, dizziness, orthopnea, PND, swelling or significant weight changes, acute bleeding, or claudication.  06/15/2023 - Presents today for follow-up.  He is doing well.  Tolerating his medications well and he is compliant with his medical therapy. Denies any chest pain, shortness of breath, palpitations, syncope, presyncope, dizziness, orthopnea,  PND, swelling or significant weight changes, acute bleeding, or claudication.  09/15/2023 - Here for follow-up. Doing very well. Shows me labs from his PCP obtained on 08/18/2023 and showed CBC, CMET, TSH, and LDL WNL. Hgb A1C was 6.8%. Complaint with medications and doing well. Denies any chest pain, shortness of breath, palpitations, syncope, presyncope, dizziness, orthopnea, PND, swelling or significant weight changes, acute bleeding, or claudication.  ROS: Negative. See HPI.   SH: In his free time he enjoys being outdoors and mows yards.  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  Studies Reviewed: Sean    EKG: EKG is not ordered today.  TTE [facility] [place] (04/11/2023): Conclusions Summary: 1.  Left ventricle: The cavity size is mildly increased.  Wall thickness is normal.  Systolic function is normal.  The close his LVEF is 25 to 30%, by visual estimate.  The diastolic function and estimated filling pressure is normal. 2.  Aortic valve: There is moderate stenosis. 3.  Mitral valve: The annulus is mildly calcified.  The leaflets are moderately thickened and moderately calcified.  There is moderate regurgitation. 4.  Right ventricle: The cavity size is normal.  Wall thickness is normal.  Systolic function is normal.  Systolic pressure is within normal range.  Lexiscan  03/2022:    The study is normal. There are no perfusion defects  The study is low risk.   No ST deviation was noted.   LV perfusion is normal.   Left ventricular function is normal. Nuclear stress EF: 67 %. The left ventricular ejection fraction is hyperdynamic (>65%). End diastolic cavity size is normal.  [facility] 12/2019:  Severe calcific coronary  disease involving the left anterior descending, circumflex, and left main. 40 to 50% eccentric distal left main Bulky eccentric 80% ostial to proximal LAD showing progression compared to 2020.  Moderate mid to distal diffuse LAD disease. Diffuse ostial to proximal  in-stent restenosis in circumflex which has 2 layers of stent.  Moderate mid to distal circumflex 50% narrowing. Occlusion of ramus intermedius, very small and fills by left to left collaterals. Dominant right coronary with luminal irregularities but no high-grade obstruction. Normal anterior wall motion.  Estimated > EF 50%.  Inferior wall not well seen.  LVEDP is normal.   RECOMMENDATIONS:   Discontinue Plavix  IV heparin  Recommend two-vessel coronary bypass surgery in the setting of significant proximal LAD disease and in-stent restenosis x3 of the ostial circumflex (2 layers of stent). 2D Doppler echocardiogram to fully assess LV function.  Physical Exam:   VS:  BP 112/60   Pulse 72   Ht 5' 9 (1.753 m)   Wt 194 lb 12.8 oz (88.4 kg)   SpO2 100%   BMI 28.77 kg/m    Wt Readings from Last 3 Encounters:  09/15/23 194 lb 12.8 oz (88.4 kg)  06/15/23 192 lb (87.1 kg)  04/19/23 191 lb 12.8 oz (87 kg)    GEN: Well nourished, well developed in no acute distress NECK: No JVD; No carotid bruits CARDIAC: S1/S2, RRR, Grade 2/6 murmur, no rubs, no gallops RESPIRATORY:  Clear to auscultation without rales, wheezing or rhonchi  ABDOMEN: Soft, non-tender, non-distended EXTREMITIES:  No edema; No deformity   ASSESSMENT AND PLAN: .    HFrEF, medication management Stage C, NYHA class I-II symptoms.  EF 25 to 30% seen on most recent echocardiogram during hospitalization.  Etiology unclear, however strong suspicion due to the setting of recent acute illness as he was being treated for pneumonia at the time. Euvolemic and well compensated on exam.  Continue Coreg , Farxiga , Lasix , Imdur , Entresto , and will begin low dose Aldactone  12.5 mg daily and obtain BMET in 2-3 weeks. Instructed him to stop his potassium supplement when he starts taking his Aldactone . He verbalized understanding. Low sodium diet, fluid restriction <2L, and daily weights encouraged. Educated to contact our office for weight gain of  2 lbs overnight or 5 lbs in one week.  Plan to update echocardiogram at the next office visit once GDMT is at maximum titration.    CAD, s/p CABG in 2021 Stable with no anginal symptoms. No indication for ischemic evaluation.  Continue current medication regimen. See changes as noted above. Heart healthy diet and regular cardiovascular exercise encouraged.   Aortic valve stenosis, valvular insufficiency Most recent Echo at [facility] earlier this year revealed moderate aortic valve stenosis and moderate mitral valvular insufficiency. Denies any specific symptoms. Will continue with regular surveillance. Plan to update in 1 year (March 2026).   HTN BP stable. Discussed to monitor BP at home at least 2 hours after medications and sitting for 5-10 minutes. No medication changes at this time besides what is noted above. Heart healthy diet and regular cardiovascular exercise encouraged. Getting labs as mentioned above.   Dyslipidemia Most recent LDL 40. Continue atorvastatin . Heart healthy diet and regular cardiovascular exercise encouraged.      Dispo: Follow-up with MD/[facility] in 3 months or sooner if any changes.  Signed, Almarie Crate, NP

## 2023-09-15 NOTE — Patient Instructions (Addendum)
 Medication Instructions:  Your physician has recommended you make the following change in your medication:  Please start Spironolactone  12.5 Mg daily  When you receive your Spironolactone  stop taking Potassium   Labwork: In 2-3 weeks at [facility]   Testing/Procedures: None   Follow-Up: Your physician recommends that you schedule a follow-up appointment in: 3 Months   Any Other Special Instructions Will Be Listed Below (If Applicable).  If you need a refill on your cardiac medications before your next appointment, please call your pharmacy.

## 2023-09-21 DIAGNOSIS — D0461 Carcinoma in situ of skin of right upper limb, including shoulder: Secondary | ICD-10-CM | POA: Diagnosis not present

## 2023-09-23 DIAGNOSIS — I10 Essential (primary) hypertension: Secondary | ICD-10-CM | POA: Diagnosis not present

## 2023-09-23 DIAGNOSIS — E119 Type 2 diabetes mellitus without complications: Secondary | ICD-10-CM | POA: Diagnosis not present

## 2023-09-27 DIAGNOSIS — E119 Type 2 diabetes mellitus without complications: Secondary | ICD-10-CM | POA: Diagnosis not present

## 2023-09-27 DIAGNOSIS — H524 Presbyopia: Secondary | ICD-10-CM | POA: Diagnosis not present

## 2023-09-27 DIAGNOSIS — H401122 Primary open-angle glaucoma, left eye, moderate stage: Secondary | ICD-10-CM | POA: Diagnosis not present

## 2023-09-27 DIAGNOSIS — H5203 Hypermetropia, bilateral: Secondary | ICD-10-CM | POA: Diagnosis not present

## 2023-09-27 DIAGNOSIS — Z9849 Cataract extraction status, unspecified eye: Secondary | ICD-10-CM | POA: Diagnosis not present

## 2023-09-27 DIAGNOSIS — H35371 Puckering of macula, right eye: Secondary | ICD-10-CM | POA: Diagnosis not present

## 2023-09-27 DIAGNOSIS — H52223 Regular astigmatism, bilateral: Secondary | ICD-10-CM | POA: Diagnosis not present

## 2023-09-27 DIAGNOSIS — H1045 Other chronic allergic conjunctivitis: Secondary | ICD-10-CM | POA: Diagnosis not present

## 2023-09-27 DIAGNOSIS — Z961 Presence of intraocular lens: Secondary | ICD-10-CM | POA: Diagnosis not present

## 2023-09-27 DIAGNOSIS — H43813 Vitreous degeneration, bilateral: Secondary | ICD-10-CM | POA: Diagnosis not present

## 2023-10-03 ENCOUNTER — Other Ambulatory Visit: Payer: Self-pay | Admitting: Nurse Practitioner

## 2023-10-06 ENCOUNTER — Other Ambulatory Visit (HOSPITAL_COMMUNITY)
Admission: RE | Admit: 2023-10-06 | Discharge: 2023-10-06 | Disposition: A | Discharge status: Home / Self Care | Source: Ambulatory Visit | Attending: Nurse Practitioner | Admitting: Nurse Practitioner

## 2023-10-06 DIAGNOSIS — Z79899 Other long term (current) drug therapy: Secondary | ICD-10-CM | POA: Diagnosis not present

## 2023-10-06 DIAGNOSIS — I5020 Unspecified systolic (congestive) heart failure: Secondary | ICD-10-CM | POA: Diagnosis not present

## 2023-10-06 LAB — BASIC METABOLIC PANEL WITH GFR
Anion gap: 15 (ref 5–15)
BUN: 23 mg/dL (ref 8–23)
CO2: 25 mmol/L (ref 22–32)
Calcium: 9 mg/dL (ref 8.9–10.3)
Chloride: 101 mmol/L (ref 98–111)
Creatinine, Ser: 0.9 mg/dL (ref 0.61–1.24)
GFR, Estimated: >60 mL/min (ref >60)
Glucose, Bld: 146 mg/dL — ABNORMAL HIGH (ref 70–99)
Potassium: 3.8 mmol/L (ref 3.5–5.1)
Sodium: 141 mmol/L (ref 135–145)

## 2023-10-09 DIAGNOSIS — R7989 Other specified abnormal findings of blood chemistry: Secondary | ICD-10-CM | POA: Diagnosis not present

## 2023-10-09 DIAGNOSIS — D696 Thrombocytopenia, unspecified: Secondary | ICD-10-CM | POA: Diagnosis not present

## 2023-10-09 DIAGNOSIS — N400 Enlarged prostate without lower urinary tract symptoms: Secondary | ICD-10-CM | POA: Diagnosis not present

## 2023-10-09 DIAGNOSIS — E1122 Type 2 diabetes mellitus with diabetic chronic kidney disease: Secondary | ICD-10-CM | POA: Diagnosis not present

## 2023-10-09 DIAGNOSIS — I340 Nonrheumatic mitral (valve) insufficiency: Secondary | ICD-10-CM | POA: Diagnosis not present

## 2023-10-09 DIAGNOSIS — Z87891 Personal history of nicotine dependence: Secondary | ICD-10-CM | POA: Diagnosis not present

## 2023-10-09 DIAGNOSIS — Z7982 Long term (current) use of aspirin: Secondary | ICD-10-CM | POA: Diagnosis not present

## 2023-10-09 DIAGNOSIS — I509 Heart failure, unspecified: Secondary | ICD-10-CM | POA: Diagnosis not present

## 2023-10-09 DIAGNOSIS — I452 Bifascicular block: Secondary | ICD-10-CM | POA: Diagnosis not present

## 2023-10-09 DIAGNOSIS — R079 Chest pain, unspecified: Secondary | ICD-10-CM | POA: Diagnosis not present

## 2023-10-09 DIAGNOSIS — Z87442 Personal history of urinary calculi: Secondary | ICD-10-CM | POA: Diagnosis not present

## 2023-10-09 DIAGNOSIS — K219 Gastro-esophageal reflux disease without esophagitis: Secondary | ICD-10-CM | POA: Diagnosis not present

## 2023-10-09 DIAGNOSIS — I5022 Chronic systolic (congestive) heart failure: Secondary | ICD-10-CM | POA: Diagnosis not present

## 2023-10-09 DIAGNOSIS — I361 Nonrheumatic tricuspid (valve) insufficiency: Secondary | ICD-10-CM | POA: Diagnosis not present

## 2023-10-09 DIAGNOSIS — I517 Cardiomegaly: Secondary | ICD-10-CM | POA: Diagnosis not present

## 2023-10-09 DIAGNOSIS — I130 Hypertensive heart and chronic kidney disease with heart failure and stage 1 through stage 4 chronic kidney disease, or unspecified chronic kidney disease: Secondary | ICD-10-CM | POA: Diagnosis not present

## 2023-10-09 DIAGNOSIS — I214 Non-ST elevation (NSTEMI) myocardial infarction: Secondary | ICD-10-CM | POA: Diagnosis not present

## 2023-10-09 DIAGNOSIS — I350 Nonrheumatic aortic (valve) stenosis: Secondary | ICD-10-CM | POA: Diagnosis not present

## 2023-10-09 DIAGNOSIS — I2510 Atherosclerotic heart disease of native coronary artery without angina pectoris: Secondary | ICD-10-CM | POA: Diagnosis not present

## 2023-10-09 DIAGNOSIS — Z7902 Long term (current) use of antithrombotics/antiplatelets: Secondary | ICD-10-CM | POA: Diagnosis not present

## 2023-10-09 DIAGNOSIS — L905 Scar conditions and fibrosis of skin: Secondary | ICD-10-CM | POA: Diagnosis not present

## 2023-10-09 DIAGNOSIS — I252 Old myocardial infarction: Secondary | ICD-10-CM | POA: Diagnosis not present

## 2023-10-09 DIAGNOSIS — Z7984 Long term (current) use of oral hypoglycemic drugs: Secondary | ICD-10-CM | POA: Diagnosis not present

## 2023-10-09 DIAGNOSIS — N189 Chronic kidney disease, unspecified: Secondary | ICD-10-CM | POA: Diagnosis not present

## 2023-10-09 DIAGNOSIS — Z880 Allergy status to penicillin: Secondary | ICD-10-CM | POA: Diagnosis not present

## 2023-10-09 DIAGNOSIS — Z7985 Long-term (current) use of injectable non-insulin antidiabetic drugs: Secondary | ICD-10-CM | POA: Diagnosis not present

## 2023-10-09 DIAGNOSIS — Z955 Presence of coronary angioplasty implant and graft: Secondary | ICD-10-CM | POA: Diagnosis not present

## 2023-10-09 DIAGNOSIS — Z951 Presence of aortocoronary bypass graft: Secondary | ICD-10-CM | POA: Diagnosis not present

## 2023-10-09 DIAGNOSIS — R0989 Other specified symptoms and signs involving the circulatory and respiratory systems: Secondary | ICD-10-CM | POA: Diagnosis not present

## 2023-10-09 DIAGNOSIS — Z8249 Family history of ischemic heart disease and other diseases of the circulatory system: Secondary | ICD-10-CM | POA: Diagnosis not present

## 2023-10-09 NOTE — Discharge Summary (Signed)
 ------------------------------------------------------------------------------- Attestation signed by Sharilyn Code, MD at 10/13/2023  6:15 AM I saw and evaluated the patient and reviewed the discharge summary. I agree with the resident's findings and plan, as above.  On maximally tolerated GDMT. Access site ok. 1 year of uninterrupted DAPT emphasized. Followup arranged. Access center number given.   -------------------------------------------------------------------------------    [facility] Discharge Summary  Name: Sean Villarreal MRN: [id number] Age: 83 yrs DOB: Dec 01, 1940  Admit date: 10/09/2023   Discharge date and time: 10/12/2023  Admitting Physician: David Xiaoming Zhao, MD Discharge Physician: : No att. providers found  Admission Diagnoses:  NSTEMI (non-ST elevated myocardial infarction)    (CMD) [I21.4]   Discharge Diagnoses:  NSTEMI type I  Admission Condition: fair Discharged Condition: fair  Hospital Course:  For full details, please see H&P, progress notes, consult notes and ancillary notes. Briefly, Sean Villarreal is a 83 y.o. male with PMHx of HTN, T2DM, CAD s/p 2 DES to proximal to mid circumflex in 2018, CABG (LIMA-LAD, SVG- OM) in 2021, HFrEF, BPH, GERD, and OA who was transferred from OSH for chest pain.   NSTEMI, type I Patient presented to OSH after an episode of chest pain that awoke him from sleep.  The pain was burning/pressure in nature with radiation to the shoulder blades.  Patient has extensive cardiac history, with recent diagnosis of HFrEF and previous history of CABG in 2021.  At outside hospital, patient was found to have troponin elevation to 3000-> 14,000 with ECG showing no ischemic changes.  He was loaded with aspirin  and started on heparin  gtt then sent to [facility] for further management.  On arrival here, patient was chest pain-free.  ECG showed bifascicular block and mild ST depressions in V1.  Initial troponin elevated to 25,000.  On 9/15  patient underwent left heart cath that showed multivessel stenosis requiring lithotripsy and DES stent to the mid-RCA.   Thrombocytopenia  Has had intermittent thrombocytopenia in the past and was admitted w thrombocytopenia at 134 and remained stable during hospitalization w nadir at 128. CBC was monitored daily and no evidence of bleeding was observed.   Tests Pending at the time of discharge:    Discharge Follow-up Action Items: Aspirin  81mg  daily indefinitely barring any complications Clopidogrel  75 mg daily for 12 months as part of dual antiplatelet therapy from 10/11/2023 Lipoprotein A results Starting Zetia 10mg  daily Restart lasix  and farxiga  on discharge F/u w Cardiology at [facility] on 12/16/23  Patient's Ordered Code Status: Full Code  Consults: None  CBC:  Results from last 7 days  Lab Units 10/11/23 0619 10/10/23 0714 10/09/23 1120 10/09/23 1020  WHITE BLOOD CELL COUNT 10*3/uL 12.70* 8.00 8.70 9.90  HEMOGLOBIN g/dL 85.1 84.7 85.4 84.9  HEMATOCRIT % 43.1 44.9 42.3 44.4  PLATELET COUNT 10*3/uL 128* 132* 171 134*   CMP:  Results from last 7 days  Lab Units 10/11/23 0619 10/10/23 0714 10/09/23 1020  SODIUM mmol/L 139 139 138  POTASSIUM mmol/L 4.1 3.7 5.1  CHLORIDE mmol/L 107 105 102  CO2 mmol/L 23 25 28   BUN mg/dL 23 17 22   CREATININE mg/dL 9.04 9.12 9.11  CALCIUM  mg/dL 8.5* 8.8 8.8  MAGNESIUM  mg/dL 1.9 1.9  --   BILIRUBIN TOTAL mg/dL  --   --  0.7  AST U/L  --   --  98*  ALT U/L  --   --  21  TOTAL PROTEIN g/dL  --   --  6.7  ALBUMIN  g/dL  --   --  3.9  ANION GAP mmol/L 9 9 8    Troponin: peaked at 25,785  Disposition: Home or Self Care   Patient Instructions:    Discharge Medications     New Medications      Sig Disp Refill Start End  clopidogreL  75 mg tablet Commonly known as: PLAVIX   Take 1 tablet (75 mg total) by mouth daily.  90 tablet  0     ezetimibe 10 mg tablet Commonly known as: ZETIA  Take 1 tablet (10 mg total) by mouth at  bedtime.  90 tablet  0     metoprolol  tartrate 25 mg tablet Commonly known as: LOPRESSOR   Take 1 tablet (25 mg total) by mouth 2 (two) times a day.  180 tablet  0         Medications To Continue      Sig Disp Refill Start End  aspirin  81 mg EC tablet  Take 81 mg by mouth daily.   0     atorvastatin  80 mg tablet Commonly known as: LIPITOR   Take 80 mg by mouth daily.   0     dapagliflozin  propanediol 10 mg Tab tablet Commonly known as: FARXIGA   Take 10 mg by mouth daily.   0     Entresto  24-26 mg per tablet Generic drug: sacubitriL -valsartan   Take 1 tablet by mouth 2 (two) times a day.   0     furosemide  20 mg tablet Commonly known as: LASIX   Take 20 mg by mouth every other day.   0     isosorbide  mononitrate 30 mg 24 hr tablet Commonly known as: IMDUR   Take 30 mg by mouth 2 (two) times a day.   0     ketoconazole  2 % cream Commonly known as: NIZORAL     0     latanoprost  0.005 % ophthalmic solution Commonly known as: XALATAN   Administer 1 drop into both eyes at bedtime.   0     metFORMIN  1,000 mg tablet Commonly known as: GLUCOPHAGE   Take 1,000 mg by mouth in the morning and 1,000 mg in the evening. Take with meals.   0     nitroglycerin  0.4 mg SL tablet Commonly known as: NITROSTAT   Place 0.4 mg under the tongue every 5 (five) minutes as needed for chest pain.   0     Simbrinza 1-0.2 % Drps Generic drug: brinzolamide -brimonidine  Administer 1 drop into left eye 2 (two) times a day.   0     spironolactone  25 mg tablet Commonly known as: ALDACTONE   Take 12.5 mg by mouth daily.   0     tamsulosin  0.4 mg Cap Commonly known as: FLOMAX   Take 0.4 mg by mouth 2 (two) times a day.   0     Trulicity 0.75 mg/0.5 mL subcutaneous pen injector Generic drug: dulaglutide  Inject 0.75 mg under the skin every 7 days.   0         Stopped Medications    carvediloL  25 mg tablet Commonly known as: COREG    glimepiride  1 mg tablet Commonly known as:  AMARYL    lisinopriL  40 mg tablet Commonly known as: PRINIVIL    potassium chloride  20 mEq ER tablet        Follow-up  No future appointments.   Electronically signed by: Chiquita Babara Dutton, MD 10/12/2023

## 2023-10-10 NOTE — Progress Notes (Signed)
 Case Management Screening  CSN: [id number] DOB: December 08, 1940 Service: Cardiology Location: 5303/C   Initial Screening Readmission Risk Score v2: 10 Risk Level: Low - Patient does not meet high risk criteria for post hospital services. Social Investment banker, operational will be available to assist as indicated.  Per chart review:  83 y.o. male with PMHx of HTN, T2DM, CAD s/p 2 DES to proximal to mid circumflex in 2018, CABG (LIMA-LAD, SVG- OM) in 2021, HFrEF, BPH, GERD, and OA who was transferred from OSH for chest pain.     Rollene DELENA Silence, RN

## 2023-10-11 NOTE — Care Plan (Signed)
  Problem: Activity Alteration: Heart Failure Goal: Capacity to carry out activities of daily living will improve by discharge Description: Capacity to carry out activities of daily living will improve by discharge Outcome: Progressing

## 2023-10-12 ENCOUNTER — Other Ambulatory Visit (HOSPITAL_COMMUNITY): Payer: Self-pay

## 2023-10-12 ENCOUNTER — Telehealth: Payer: Self-pay

## 2023-10-12 ENCOUNTER — Encounter: Payer: Self-pay | Admitting: Cardiology

## 2023-10-12 ENCOUNTER — Ambulatory Visit: Attending: Cardiology | Admitting: Cardiology

## 2023-10-12 ENCOUNTER — Other Ambulatory Visit (HOSPITAL_BASED_OUTPATIENT_CLINIC_OR_DEPARTMENT_OTHER): Payer: Self-pay

## 2023-10-12 VITALS — BP 140/88 | HR 97 | Ht 69.0 in | Wt 192.6 lb

## 2023-10-12 DIAGNOSIS — I214 Non-ST elevation (NSTEMI) myocardial infarction: Secondary | ICD-10-CM | POA: Insufficient documentation

## 2023-10-12 DIAGNOSIS — I25119 Atherosclerotic heart disease of native coronary artery with unspecified angina pectoris: Secondary | ICD-10-CM | POA: Insufficient documentation

## 2023-10-12 DIAGNOSIS — I350 Nonrheumatic aortic (valve) stenosis: Secondary | ICD-10-CM | POA: Insufficient documentation

## 2023-10-12 DIAGNOSIS — Z79899 Other long term (current) drug therapy: Secondary | ICD-10-CM | POA: Insufficient documentation

## 2023-10-12 DIAGNOSIS — I5022 Chronic systolic (congestive) heart failure: Secondary | ICD-10-CM | POA: Insufficient documentation

## 2023-10-12 MED ORDER — SACUBITRIL-VALSARTAN 24-26 MG PO TABS
1.0000 | ORAL_TABLET | Freq: Two times a day (BID) | ORAL | 2 refills | Status: DC
  Filled 2023-10-12: qty 180, 90d supply, fill #0
  Filled 2023-10-24: qty 60, 30d supply, fill #0

## 2023-10-12 MED ORDER — ISOSORBIDE MONONITRATE ER 30 MG PO TB24
30.0000 mg | ORAL_TABLET | Freq: Every day | ORAL | 3 refills | Status: DC
  Filled 2023-10-12: qty 90, 90d supply, fill #0

## 2023-10-12 MED ORDER — FUROSEMIDE 40 MG PO TABS
40.0000 mg | ORAL_TABLET | Freq: Every day | ORAL | 3 refills | Status: AC
  Filled 2023-10-12: qty 90, 90d supply, fill #0
  Filled 2024-01-16: qty 90, 90d supply, fill #1

## 2023-10-12 MED ORDER — CARVEDILOL 25 MG PO TABS
25.0000 mg | ORAL_TABLET | Freq: Two times a day (BID) | ORAL | 3 refills | Status: DC
  Filled 2023-10-12: qty 180, 90d supply, fill #0

## 2023-10-12 NOTE — Patient Instructions (Addendum)
 Medication Instructions:  Your physician has recommended you make the following change in your medication:  Stop metoprolol  tartrate Restart carvedilol  25 mg twice daily Change furosemide  to 40 mg daily Change isosorbide  mononitrate to 30 mg daily Continue all other medications as prescribed  Labwork: BMET in 6 weeks just before your next visit.  Non-fasting [facility] ([address] [place]) or [facility]  Testing/Procedures: none  Follow-Up: Your physician recommends that you schedule a follow-up appointment in: 6 weeks  Any Other Special Instructions Will Be Listed Below (If Applicable).  If you need a refill on your cardiac medications before your next appointment, please call your pharmacy.

## 2023-10-12 NOTE — Progress Notes (Signed)
 Cardiology Office Note  Date: 10/12/2023   ID: Sean Villarreal, DOB [birth year], MRN [id number]  History of Present Illness: Sean Villarreal is an 83 y.o. male seen recently in August by Ms. Miriam NP, I reviewed her note.  I reviewed substantial interval records.  He was just recently hospitalized at [facility] with NSTEMI.  Peak high-sensitivity troponin I 25,000 range.  Cardiac catheterization demonstrated multivessel disease with culprit lesion felt to be mid RCA managed with DES.  LVEF by echocardiogram was actually 45 to 50% representing an improvement over prior assessment in March.  He presents today for follow-up, reports no chest pain or shortness of breath.  We reviewed his medications in detail.  Several adjustments were made at [facility].  We talked about several planned modifications today.  I reviewed his lab work and interval testing.  Physical Exam: VS:  BP (!) 140/88 (BP Location: Left Arm)   Pulse 97   Ht 5' 9 (1.753 m)   Wt 192 lb 9.6 oz (87.4 kg)   SpO2 98%   BMI 28.44 kg/m , BMI Body mass index is 28.44 kg/m.  Wt Readings from Last 3 Encounters:  10/12/23 192 lb 9.6 oz (87.4 kg)  09/15/23 194 lb 12.8 oz (88.4 kg)  06/15/23 192 lb (87.1 kg)    General: Patient appears comfortable at rest. HEENT: Conjunctiva and lids normal. Neck: Supple, no elevated JVP or carotid bruits. Lungs: Clear to auscultation, nonlabored breathing at rest. Cardiac: Regular rate and rhythm, no S3, 2/6 systolic murmur. Extremities: No pitting edema.  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  Labwork: 12/31/2022: Hemoglobin 13.7; Platelets 138 10/06/2023: BUN 23; Creatinine, Ser 0.90; Potassium 3.8; Sodium 141     Component Value Date/Time   CHOL 132 01/22/2020 0637   TRIG 67 01/22/2020 0637   HDL 52 01/22/2020 0637   CHOLHDL 2.5 01/22/2020 0637    VLDL 13 01/22/2020 0637   LDLCALC 67 01/22/2020 0637  September 2025: Potassium 4.1, BUN 23, creatinine 0.95, GFR 80, lipoprotein A 244, peak high-sensitivity troponin I 74214, pro-BNP 3736, cholesterol 115, triglycerides 58, HDL 43, LDL 59  Other Studies Reviewed Today:  Echocardiogram 10/10/2023 ([facility]): SUMMARY The left ventricular size is normal. Left ventricular systolic function is mildly reduced. LV ejection fraction = 45-50%. Unable to fully assess LV regional wall motion The right ventricle is normal in size and function. The left atrium is mildly to moderately dilated. There is mild to moderate aortic stenosis. There is mild to moderate mitral regurgitation. There is mild tricuspid regurgitation. There was insufficient TR detected to calculate RV systolic pressure. There is no pericardial effusion. There is no comparison study available.   Cardiac catheterization 10/10/2023 ([facility]):   Ost LAD to Prox LAD lesion is 90% stenosed.    Mid LAD-2 lesion is 100% stenosed.    Ost Cx lesion is 100% stenosed.    Mid LAD-1 lesion is 70% stenosed.    Prox Cx lesion is 100% stenosed.    Mid RCA lesion is 80% stenosed, reduced to 0%.   Indication: Non St-Elevation Myocardial Infarction  Procedure:  - Successful percutaneous coronary intervention with intravascular  lithotripsy  and drug-eluting stent deployment to the mid right coronary  artery  - Intravascular ultrasound   Recommendations  -Aspirin  81mg  daily indefinitely barring any complications  -Clopidogrel  75 mg daily for 12 months as  part of dual antiplatelet  therapy from 10/11/2023  -Rest per primary team   Assessment and Plan:  1.  CAD status post DES to the LAD and circumflex in 2014, angioplasty of the circumflex for treatment of in-stent restenosis in 2018, DES x 2 to the proximal to mid circumflex in 2018, and ultimately CABG with LIMA to LAD and SVG to OM in December 2021.  Recently evaluated at  [facility] with NSTEMI and underwent DES intervention to the mid RCA.  No reported angina at this time.  We went over his medications, would continue aspirin  81 mg daily, Plavix  75 mg daily, Lipitor  80 mg daily and Zetia 10 mg daily.  Change Imdur  to 30 mg once daily.  2.  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  Echocardiogram at [facility] indicates LVEF 45 to 50% range.  This represents significant improvement compared to prior assessment in March.  Plan to modify his medications.  Stop Lopressor  and go back to Coreg  25 mg twice daily, continue Farxiga  10 mg daily, continue Aldactone  12.5 mg daily, continue Entresto  24/26 mg twice daily, and change Lasix  to 40 mg daily for now.  He will continue to track weight and fluid status at home.  Check BMET for follow-up visit.   3.  Degenerative calcific aortic stenosis, mild to moderate by recent follow-up echocardiogram at [facility].  Mean AV gradient approximately 19 mmHg.   4.  Primary hypertension.  Disposition:  Follow up 6 weeks.  Signed, Jayson JUDITHANN Sierras, M.D., F.A.C.C.  HeartCare at [facility]

## 2023-10-12 NOTE — Transitions of Care (Post Inpatient/ED Visit) (Signed)
   10/12/2023  Name: Sean Villarreal MRN: [id number] DOB: October 16, 1940  Today's TOC FU Call Status: Today's TOC FU Call Status:: Unsuccessful Call (1st Attempt) Unsuccessful Call (1st Attempt) Date: 10/12/23  Attempted to reach the patient regarding the most recent Inpatient/ED visit.  TOC RN CM left confidential voice mail on designated cell phone number per DPR in EPIC,  identified TOC RN CM as caller, and left a call back number, also noting that patient may leave a secure voice mail at this call back number.      Follow Up Plan: Additional outreach attempts will be made to reach the patient to complete the Transitions of Care (Post Inpatient/ED visit) call.    Bing Edison MSN, RN RN Case Sales executive Health  [facility] Office Hours M-F [phone] Direct Dial: [phone] Main Phone [phone]  Fax: [phone] [email or web address]

## 2023-10-13 ENCOUNTER — Telehealth: Payer: Self-pay

## 2023-10-13 NOTE — Transitions of Care (Post Inpatient/ED Visit) (Signed)
   10/13/2023  Name: Sean Villarreal MRN: [id number] DOB: 08-Dec-1940  Today's TOC FU Call Status: Today's TOC FU Call Status:: Unsuccessful Call (2nd Attempt) Unsuccessful Call (2nd Attempt) Date: 10/13/23  Attempted to reach the patient regarding the most recent Inpatient/ED visit.  TOC RN CM left confidential voice mail on designated cell phone number per DPR in EPIC,  identified TOC RN CM as caller, and left a call back number, also noting that patient may leave a secure voice mail at this call back number.      Follow Up Plan: Additional outreach attempts will be made to reach the patient to complete the Transitions of Care (Post Inpatient/ED visit) call.    Bing Edison MSN, RN RN Case Sales executive Health  [facility] Office Hours M-F [phone] Direct Dial: [phone] Main Phone [phone]  Fax: [phone] [email or web address]

## 2023-10-14 ENCOUNTER — Telehealth: Payer: Self-pay | Admitting: *Deleted

## 2023-10-14 DIAGNOSIS — I249 Acute ischemic heart disease, unspecified: Secondary | ICD-10-CM | POA: Diagnosis not present

## 2023-10-14 DIAGNOSIS — E1165 Type 2 diabetes mellitus with hyperglycemia: Secondary | ICD-10-CM | POA: Diagnosis not present

## 2023-10-14 DIAGNOSIS — Z23 Encounter for immunization: Secondary | ICD-10-CM | POA: Diagnosis not present

## 2023-10-14 DIAGNOSIS — Z6829 Body mass index (BMI) 29.0-29.9, adult: Secondary | ICD-10-CM | POA: Diagnosis not present

## 2023-10-14 DIAGNOSIS — I10 Essential (primary) hypertension: Secondary | ICD-10-CM | POA: Diagnosis not present

## 2023-10-14 NOTE — Transitions of Care (Post Inpatient/ED Visit) (Signed)
   10/14/2023  Name: DOUG BUCKLIN MRN: [id number] DOB: August 03, 1940  Today's TOC FU Call Status: Today's TOC FU Call Status:: Unsuccessful Call (3rd Attempt) Unsuccessful Call (3rd Attempt) Date: 10/14/23  Attempted to reach the patient regarding the most recent Inpatient/ED visit.  Follow Up Plan: No further outreach attempts will be made at this time. We have been unable to contact the patient.  Andrea Dimes RN, BSN [place]  [facility] RN Care Manager [phone]

## 2023-10-18 ENCOUNTER — Other Ambulatory Visit (HOSPITAL_BASED_OUTPATIENT_CLINIC_OR_DEPARTMENT_OTHER): Payer: Self-pay

## 2023-10-18 ENCOUNTER — Other Ambulatory Visit: Payer: Self-pay

## 2023-10-18 MED ORDER — DAPAGLIFLOZIN PROPANEDIOL 10 MG PO TABS
10.0000 mg | ORAL_TABLET | Freq: Every day | ORAL | 3 refills | Status: DC
  Filled 2023-10-18 – 2023-10-31 (×2): qty 30, 30d supply, fill #0

## 2023-10-18 MED ORDER — KETOCONAZOLE 2 % EX CREA
TOPICAL_CREAM | CUTANEOUS | 6 refills | Status: DC
  Filled 2023-10-18: qty 60, 30d supply, fill #0
  Filled 2023-12-20: qty 30, 30d supply, fill #0

## 2023-10-18 MED ORDER — DAPAGLIFLOZIN PROPANEDIOL 10 MG PO TABS: 10.0000 mg | ORAL_TABLET | Freq: Every day | ORAL | 3 refills | Status: DC

## 2023-10-18 MED ORDER — SPIRONOLACTONE 25 MG PO TABS
12.5000 mg | ORAL_TABLET | Freq: Every day | ORAL | 1 refills | Status: DC
  Filled 2023-10-18: qty 45, 90d supply, fill #0

## 2023-10-18 MED ORDER — POTASSIUM CHLORIDE CRYS ER 20 MEQ PO TBCR
20.0000 meq | EXTENDED_RELEASE_TABLET | Freq: Every day | ORAL | 1 refills | Status: AC
  Filled 2023-10-18 – 2023-12-20 (×2): qty 90, 90d supply, fill #0

## 2023-10-19 ENCOUNTER — Other Ambulatory Visit (HOSPITAL_BASED_OUTPATIENT_CLINIC_OR_DEPARTMENT_OTHER): Payer: Self-pay

## 2023-10-21 ENCOUNTER — Other Ambulatory Visit (HOSPITAL_BASED_OUTPATIENT_CLINIC_OR_DEPARTMENT_OTHER): Payer: Self-pay

## 2023-10-22 DIAGNOSIS — I452 Bifascicular block: Secondary | ICD-10-CM | POA: Diagnosis not present

## 2023-10-24 ENCOUNTER — Telehealth: Payer: Self-pay | Admitting: Cardiology

## 2023-10-24 ENCOUNTER — Other Ambulatory Visit (HOSPITAL_BASED_OUTPATIENT_CLINIC_OR_DEPARTMENT_OTHER): Payer: Self-pay

## 2023-10-24 ENCOUNTER — Other Ambulatory Visit: Payer: Self-pay | Admitting: Cardiology

## 2023-10-24 MED ORDER — CARVEDILOL 25 MG PO TABS
12.5000 mg | ORAL_TABLET | Freq: Two times a day (BID) | ORAL | Status: DC
Start: 2023-10-24 — End: 2023-10-31

## 2023-10-24 NOTE — Telephone Encounter (Signed)
 Patient notified and verbalized understanding.

## 2023-10-24 NOTE — Telephone Encounter (Signed)
 Spoke with patient - states BP has been dropping.  Recently seen on 10/12/23 - Coreg  25mg  twice a day was restarted at that visit.  States his BP on Friday, 9/26 was 85/50's - he stopped the Coreg  this day, but restarted again this morning due to BP going back up.    9/29 - BP readings -   7:40 am - 132/83 8:05 am - 140/88 8:55 am - 121/73 9:35 am - 113/72 10:25 am - 100/62  HR running 80 - 90's   No c/o chest pain or sob.  Does note some dizziness with the lower readings.

## 2023-10-24 NOTE — Telephone Encounter (Signed)
 Pt c/o BP issue: STAT if pt c/o blurred vision, one-sided weakness or slurred speech.   1. What is your BP concern?  BP is dropping  2. Have you taken any BP medication today? Yes   3. What are your last 5 BP readings? 9/26: 80s/50s 9/29: 132/83 before meds, 100/62 after meds  4. Are you having any other symptoms (ex. Dizziness, headache, blurred vision, passed out)?  No

## 2023-10-25 ENCOUNTER — Other Ambulatory Visit (HOSPITAL_BASED_OUTPATIENT_CLINIC_OR_DEPARTMENT_OTHER): Payer: Self-pay

## 2023-10-25 DIAGNOSIS — E119 Type 2 diabetes mellitus without complications: Secondary | ICD-10-CM | POA: Diagnosis not present

## 2023-10-25 DIAGNOSIS — I10 Essential (primary) hypertension: Secondary | ICD-10-CM | POA: Diagnosis not present

## 2023-10-26 MED ORDER — SACUBITRIL-VALSARTAN 24-26 MG PO TABS: 1.0000 | ORAL_TABLET | Freq: Two times a day (BID) | ORAL | 2 refills | Status: DC

## 2023-10-26 MED ORDER — CLOPIDOGREL BISULFATE 75 MG PO TABS: 75.0000 mg | ORAL_TABLET | Freq: Every day | ORAL | 3 refills | Status: DC

## 2023-10-31 ENCOUNTER — Other Ambulatory Visit (HOSPITAL_BASED_OUTPATIENT_CLINIC_OR_DEPARTMENT_OTHER): Payer: Self-pay

## 2023-10-31 ENCOUNTER — Other Ambulatory Visit: Payer: Self-pay | Admitting: *Deleted

## 2023-10-31 ENCOUNTER — Ambulatory Visit: Payer: Self-pay | Admitting: Nurse Practitioner

## 2023-10-31 MED ORDER — FUROSEMIDE 20 MG PO TABS
60.0000 mg | ORAL_TABLET | Freq: Every day | ORAL | 2 refills | Status: DC
  Filled 2023-10-31: qty 270, 90d supply, fill #0

## 2023-10-31 MED ORDER — CLOPIDOGREL BISULFATE 75 MG PO TABS
75.0000 mg | ORAL_TABLET | Freq: Every day | ORAL | 3 refills | Status: AC
  Filled 2023-10-31 – 2023-12-20 (×2): qty 90, 90d supply, fill #0

## 2023-10-31 MED ORDER — TAMSULOSIN HCL 0.4 MG PO CAPS
0.4000 mg | ORAL_CAPSULE | Freq: Two times a day (BID) | ORAL | 3 refills | Status: DC
  Filled 2023-10-31: qty 180, 90d supply, fill #0

## 2023-10-31 MED ORDER — CLOPIDOGREL BISULFATE 75 MG PO TABS
75.0000 mg | ORAL_TABLET | Freq: Every day | ORAL | 3 refills | Status: DC
  Filled 2023-10-31: qty 90, 90d supply, fill #0

## 2023-10-31 MED ORDER — TRULICITY 0.75 MG/0.5ML ~~LOC~~ SOAJ
0.7500 mg | SUBCUTANEOUS | 3 refills | Status: AC
  Filled 2023-10-31: qty 3, 42d supply, fill #0
  Filled 2023-12-20: qty 6, 84d supply, fill #0

## 2023-10-31 MED ORDER — SACUBITRIL-VALSARTAN 24-26 MG PO TABS
1.0000 | ORAL_TABLET | Freq: Two times a day (BID) | ORAL | 2 refills | Status: DC
  Filled 2023-10-31: qty 60, 30d supply, fill #0

## 2023-10-31 MED ORDER — SPIRONOLACTONE 25 MG PO TABS
12.5000 mg | ORAL_TABLET | Freq: Every day | ORAL | 1 refills | Status: DC
  Filled 2023-10-31 – 2023-11-22 (×2): qty 45, 90d supply, fill #0

## 2023-10-31 MED ORDER — ATORVASTATIN CALCIUM 80 MG PO TABS
80.0000 mg | ORAL_TABLET | Freq: Every day | ORAL | 3 refills | Status: DC
  Filled 2023-10-31 – 2023-11-01 (×2): qty 90, 90d supply, fill #0

## 2023-10-31 MED ORDER — GLIMEPIRIDE 1 MG PO TABS
1.0000 mg | ORAL_TABLET | Freq: Every day | ORAL | 3 refills | Status: DC
  Filled 2023-10-31 – 2023-11-01 (×2): qty 90, 90d supply, fill #0

## 2023-10-31 MED ORDER — CARVEDILOL 25 MG PO TABS
25.0000 mg | ORAL_TABLET | Freq: Two times a day (BID) | ORAL | 3 refills | Status: DC
  Filled 2023-10-31: qty 180, 90d supply, fill #0

## 2023-10-31 MED ORDER — ISOSORBIDE MONONITRATE ER 30 MG PO TB24
30.0000 mg | ORAL_TABLET | Freq: Every day | ORAL | 3 refills | Status: DC
  Filled 2023-10-31: qty 90, 90d supply, fill #0

## 2023-10-31 MED ORDER — TRIAMCINOLONE ACETONIDE 0.1 % EX CREA
TOPICAL_CREAM | CUTANEOUS | 6 refills | Status: DC
  Filled 2023-10-31: qty 454, 90d supply, fill #0

## 2023-10-31 MED ORDER — CARVEDILOL 12.5 MG PO TABS
12.5000 mg | ORAL_TABLET | Freq: Two times a day (BID) | ORAL | 2 refills | Status: AC
  Filled 2023-10-31: qty 180, 90d supply, fill #0
  Filled 2024-02-15: qty 180, 90d supply, fill #1

## 2023-11-01 ENCOUNTER — Other Ambulatory Visit: Payer: Self-pay

## 2023-11-01 ENCOUNTER — Other Ambulatory Visit (HOSPITAL_BASED_OUTPATIENT_CLINIC_OR_DEPARTMENT_OTHER): Payer: Self-pay

## 2023-11-01 MED ORDER — METFORMIN HCL 1000 MG PO TABS
1000.0000 mg | ORAL_TABLET | Freq: Two times a day (BID) | ORAL | 3 refills | Status: AC
  Filled 2023-11-01 – 2023-12-20 (×2): qty 180, 90d supply, fill #0

## 2023-11-02 ENCOUNTER — Other Ambulatory Visit (HOSPITAL_BASED_OUTPATIENT_CLINIC_OR_DEPARTMENT_OTHER): Payer: Self-pay

## 2023-11-10 DIAGNOSIS — I249 Acute ischemic heart disease, unspecified: Secondary | ICD-10-CM | POA: Diagnosis not present

## 2023-11-10 DIAGNOSIS — I10 Essential (primary) hypertension: Secondary | ICD-10-CM | POA: Diagnosis not present

## 2023-11-10 DIAGNOSIS — Z23 Encounter for immunization: Secondary | ICD-10-CM | POA: Diagnosis not present

## 2023-11-10 DIAGNOSIS — E1165 Type 2 diabetes mellitus with hyperglycemia: Secondary | ICD-10-CM | POA: Diagnosis not present

## 2023-11-15 ENCOUNTER — Other Ambulatory Visit: Payer: Self-pay | Admitting: Nurse Practitioner

## 2023-11-16 ENCOUNTER — Other Ambulatory Visit (HOSPITAL_COMMUNITY)
Admission: RE | Admit: 2023-11-16 | Discharge: 2023-11-16 | Disposition: A | Source: Ambulatory Visit | Attending: Cardiology | Admitting: Cardiology

## 2023-11-16 ENCOUNTER — Ambulatory Visit: Payer: Self-pay | Admitting: Cardiology

## 2023-11-16 DIAGNOSIS — Z7984 Long term (current) use of oral hypoglycemic drugs: Secondary | ICD-10-CM | POA: Diagnosis not present

## 2023-11-16 DIAGNOSIS — Z7902 Long term (current) use of antithrombotics/antiplatelets: Secondary | ICD-10-CM | POA: Diagnosis not present

## 2023-11-16 DIAGNOSIS — I25110 Atherosclerotic heart disease of native coronary artery with unstable angina pectoris: Secondary | ICD-10-CM | POA: Diagnosis present

## 2023-11-16 DIAGNOSIS — R000 Tachycardia, unspecified: Secondary | ICD-10-CM | POA: Diagnosis present

## 2023-11-16 DIAGNOSIS — I214 Non-ST elevation (NSTEMI) myocardial infarction: Secondary | ICD-10-CM | POA: Diagnosis present

## 2023-11-16 DIAGNOSIS — R778 Other specified abnormalities of plasma proteins: Secondary | ICD-10-CM | POA: Diagnosis not present

## 2023-11-16 DIAGNOSIS — K219 Gastro-esophageal reflux disease without esophagitis: Secondary | ICD-10-CM | POA: Diagnosis present

## 2023-11-16 DIAGNOSIS — I110 Hypertensive heart disease with heart failure: Secondary | ICD-10-CM | POA: Diagnosis present

## 2023-11-16 DIAGNOSIS — Z955 Presence of coronary angioplasty implant and graft: Secondary | ICD-10-CM | POA: Diagnosis not present

## 2023-11-16 DIAGNOSIS — Z8249 Family history of ischemic heart disease and other diseases of the circulatory system: Secondary | ICD-10-CM | POA: Diagnosis not present

## 2023-11-16 DIAGNOSIS — J9811 Atelectasis: Secondary | ICD-10-CM | POA: Diagnosis not present

## 2023-11-16 DIAGNOSIS — Z87891 Personal history of nicotine dependence: Secondary | ICD-10-CM | POA: Diagnosis not present

## 2023-11-16 DIAGNOSIS — R079 Chest pain, unspecified: Secondary | ICD-10-CM | POA: Diagnosis not present

## 2023-11-16 DIAGNOSIS — Z79899 Other long term (current) drug therapy: Secondary | ICD-10-CM | POA: Diagnosis not present

## 2023-11-16 DIAGNOSIS — I452 Bifascicular block: Secondary | ICD-10-CM | POA: Diagnosis present

## 2023-11-16 DIAGNOSIS — Z743 Need for continuous supervision: Secondary | ICD-10-CM | POA: Diagnosis not present

## 2023-11-16 DIAGNOSIS — R531 Weakness: Secondary | ICD-10-CM | POA: Diagnosis not present

## 2023-11-16 DIAGNOSIS — I5022 Chronic systolic (congestive) heart failure: Secondary | ICD-10-CM | POA: Insufficient documentation

## 2023-11-16 DIAGNOSIS — E785 Hyperlipidemia, unspecified: Secondary | ICD-10-CM | POA: Diagnosis present

## 2023-11-16 DIAGNOSIS — I255 Ischemic cardiomyopathy: Secondary | ICD-10-CM | POA: Diagnosis present

## 2023-11-16 DIAGNOSIS — R7989 Other specified abnormal findings of blood chemistry: Secondary | ICD-10-CM | POA: Diagnosis present

## 2023-11-16 DIAGNOSIS — I2510 Atherosclerotic heart disease of native coronary artery without angina pectoris: Secondary | ICD-10-CM | POA: Diagnosis present

## 2023-11-16 DIAGNOSIS — Z951 Presence of aortocoronary bypass graft: Secondary | ICD-10-CM | POA: Diagnosis not present

## 2023-11-16 DIAGNOSIS — Z85828 Personal history of other malignant neoplasm of skin: Secondary | ICD-10-CM | POA: Diagnosis not present

## 2023-11-16 DIAGNOSIS — I252 Old myocardial infarction: Secondary | ICD-10-CM | POA: Diagnosis not present

## 2023-11-16 DIAGNOSIS — I700 Atherosclerosis of aorta: Secondary | ICD-10-CM | POA: Diagnosis not present

## 2023-11-16 DIAGNOSIS — Z7982 Long term (current) use of aspirin: Secondary | ICD-10-CM | POA: Diagnosis not present

## 2023-11-16 DIAGNOSIS — N400 Enlarged prostate without lower urinary tract symptoms: Secondary | ICD-10-CM | POA: Diagnosis present

## 2023-11-16 DIAGNOSIS — Z7985 Long-term (current) use of injectable non-insulin antidiabetic drugs: Secondary | ICD-10-CM | POA: Diagnosis not present

## 2023-11-16 DIAGNOSIS — I350 Nonrheumatic aortic (valve) stenosis: Secondary | ICD-10-CM | POA: Diagnosis present

## 2023-11-16 DIAGNOSIS — Z880 Allergy status to penicillin: Secondary | ICD-10-CM | POA: Diagnosis not present

## 2023-11-16 DIAGNOSIS — E119 Type 2 diabetes mellitus without complications: Secondary | ICD-10-CM | POA: Diagnosis present

## 2023-11-16 DIAGNOSIS — I5042 Chronic combined systolic (congestive) and diastolic (congestive) heart failure: Secondary | ICD-10-CM | POA: Diagnosis present

## 2023-11-16 LAB — BASIC METABOLIC PANEL WITH GFR
Anion gap: 10 (ref 5–15)
BUN: 17 mg/dL (ref 8–23)
CO2: 26 mmol/L (ref 22–32)
Calcium: 9.2 mg/dL (ref 8.9–10.3)
Chloride: 103 mmol/L (ref 98–111)
Creatinine, Ser: 0.93 mg/dL (ref 0.61–1.24)
GFR, Estimated: >60 mL/min (ref >60)
Glucose, Bld: 132 mg/dL — ABNORMAL HIGH (ref 70–99)
Potassium: 3.8 mmol/L (ref 3.5–5.1)
Sodium: 140 mmol/L (ref 135–145)

## 2023-11-18 ENCOUNTER — Emergency Department (HOSPITAL_COMMUNITY)

## 2023-11-18 ENCOUNTER — Encounter (HOSPITAL_COMMUNITY): Payer: Self-pay

## 2023-11-18 ENCOUNTER — Other Ambulatory Visit: Payer: Self-pay

## 2023-11-18 ENCOUNTER — Inpatient Hospital Stay (HOSPITAL_COMMUNITY)
Admission: EM | Admit: 2023-11-18 | Discharge: 2023-11-22 | DRG: 281 | Disposition: A | Discharge status: Home / Self Care | Attending: Internal Medicine | Admitting: Internal Medicine

## 2023-11-18 DIAGNOSIS — Z880 Allergy status to penicillin: Secondary | ICD-10-CM | POA: Diagnosis not present

## 2023-11-18 DIAGNOSIS — R079 Chest pain, unspecified: Secondary | ICD-10-CM | POA: Diagnosis not present

## 2023-11-18 DIAGNOSIS — I5042 Chronic combined systolic (congestive) and diastolic (congestive) heart failure: Secondary | ICD-10-CM | POA: Diagnosis present

## 2023-11-18 DIAGNOSIS — R7989 Other specified abnormal findings of blood chemistry: Secondary | ICD-10-CM | POA: Diagnosis not present

## 2023-11-18 DIAGNOSIS — I25110 Atherosclerotic heart disease of native coronary artery with unstable angina pectoris: Secondary | ICD-10-CM | POA: Diagnosis present

## 2023-11-18 DIAGNOSIS — Z85828 Personal history of other malignant neoplasm of skin: Secondary | ICD-10-CM

## 2023-11-18 DIAGNOSIS — Z961 Presence of intraocular lens: Secondary | ICD-10-CM | POA: Diagnosis present

## 2023-11-18 DIAGNOSIS — I452 Bifascicular block: Secondary | ICD-10-CM | POA: Diagnosis present

## 2023-11-18 DIAGNOSIS — E785 Hyperlipidemia, unspecified: Secondary | ICD-10-CM | POA: Diagnosis present

## 2023-11-18 DIAGNOSIS — R000 Tachycardia, unspecified: Secondary | ICD-10-CM | POA: Diagnosis not present

## 2023-11-18 DIAGNOSIS — Z951 Presence of aortocoronary bypass graft: Secondary | ICD-10-CM | POA: Diagnosis not present

## 2023-11-18 DIAGNOSIS — J9811 Atelectasis: Secondary | ICD-10-CM | POA: Diagnosis not present

## 2023-11-18 DIAGNOSIS — I255 Ischemic cardiomyopathy: Secondary | ICD-10-CM | POA: Diagnosis present

## 2023-11-18 DIAGNOSIS — E119 Type 2 diabetes mellitus without complications: Secondary | ICD-10-CM | POA: Diagnosis present

## 2023-11-18 DIAGNOSIS — R531 Weakness: Secondary | ICD-10-CM | POA: Diagnosis not present

## 2023-11-18 DIAGNOSIS — Z8249 Family history of ischemic heart disease and other diseases of the circulatory system: Secondary | ICD-10-CM | POA: Diagnosis not present

## 2023-11-18 DIAGNOSIS — Z955 Presence of coronary angioplasty implant and graft: Secondary | ICD-10-CM

## 2023-11-18 DIAGNOSIS — Z79899 Other long term (current) drug therapy: Secondary | ICD-10-CM

## 2023-11-18 DIAGNOSIS — I252 Old myocardial infarction: Secondary | ICD-10-CM | POA: Diagnosis not present

## 2023-11-18 DIAGNOSIS — Z7984 Long term (current) use of oral hypoglycemic drugs: Secondary | ICD-10-CM

## 2023-11-18 DIAGNOSIS — K219 Gastro-esophageal reflux disease without esophagitis: Secondary | ICD-10-CM | POA: Diagnosis present

## 2023-11-18 DIAGNOSIS — I110 Hypertensive heart disease with heart failure: Secondary | ICD-10-CM | POA: Diagnosis present

## 2023-11-18 DIAGNOSIS — Z888 Allergy status to other drugs, medicaments and biological substances status: Secondary | ICD-10-CM

## 2023-11-18 DIAGNOSIS — Z87891 Personal history of nicotine dependence: Secondary | ICD-10-CM | POA: Diagnosis not present

## 2023-11-18 DIAGNOSIS — N400 Enlarged prostate without lower urinary tract symptoms: Secondary | ICD-10-CM | POA: Diagnosis present

## 2023-11-18 DIAGNOSIS — Z91041 Radiographic dye allergy status: Secondary | ICD-10-CM

## 2023-11-18 DIAGNOSIS — I350 Nonrheumatic aortic (valve) stenosis: Secondary | ICD-10-CM | POA: Diagnosis not present

## 2023-11-18 DIAGNOSIS — Z87442 Personal history of urinary calculi: Secondary | ICD-10-CM

## 2023-11-18 DIAGNOSIS — M1990 Unspecified osteoarthritis, unspecified site: Secondary | ICD-10-CM | POA: Diagnosis present

## 2023-11-18 DIAGNOSIS — Z7982 Long term (current) use of aspirin: Secondary | ICD-10-CM

## 2023-11-18 DIAGNOSIS — I700 Atherosclerosis of aorta: Secondary | ICD-10-CM | POA: Diagnosis not present

## 2023-11-18 DIAGNOSIS — I10 Essential (primary) hypertension: Secondary | ICD-10-CM | POA: Diagnosis present

## 2023-11-18 DIAGNOSIS — I2510 Atherosclerotic heart disease of native coronary artery without angina pectoris: Secondary | ICD-10-CM | POA: Diagnosis present

## 2023-11-18 DIAGNOSIS — R778 Other specified abnormalities of plasma proteins: Secondary | ICD-10-CM | POA: Diagnosis not present

## 2023-11-18 DIAGNOSIS — Z743 Need for continuous supervision: Secondary | ICD-10-CM | POA: Diagnosis not present

## 2023-11-18 DIAGNOSIS — Z7985 Long-term (current) use of injectable non-insulin antidiabetic drugs: Secondary | ICD-10-CM

## 2023-11-18 DIAGNOSIS — I214 Non-ST elevation (NSTEMI) myocardial infarction: Secondary | ICD-10-CM | POA: Diagnosis not present

## 2023-11-18 DIAGNOSIS — Z7902 Long term (current) use of antithrombotics/antiplatelets: Secondary | ICD-10-CM

## 2023-11-18 DIAGNOSIS — Z9841 Cataract extraction status, right eye: Secondary | ICD-10-CM

## 2023-11-18 DIAGNOSIS — Z9079 Acquired absence of other genital organ(s): Secondary | ICD-10-CM

## 2023-11-18 DIAGNOSIS — Z9842 Cataract extraction status, left eye: Secondary | ICD-10-CM

## 2023-11-18 LAB — CBC
HCT: 48.1 % (ref 39.0–52.0)
Hemoglobin: 15.6 g/dL (ref 13.0–17.0)
MCH: 31.1 pg (ref 26.0–34.0)
MCHC: 32.4 g/dL (ref 30.0–36.0)
MCV: 96 fL (ref 80.0–100.0)
Platelets: 166 K/uL (ref 150–400)
RBC: 5.01 MIL/uL (ref 4.22–5.81)
RDW: 13.2 % (ref 11.5–15.5)
WBC: 10.4 K/uL (ref 4.0–10.5)
nRBC: 0 % (ref 0.0–0.2)

## 2023-11-18 LAB — TROPONIN T, HIGH SENSITIVITY
Troponin T High Sensitivity: 132 ng/L (ref 0–19)
Troponin T High Sensitivity: 183 ng/L (ref 0–19)

## 2023-11-18 LAB — BASIC METABOLIC PANEL WITH GFR
Anion gap: 13 (ref 5–15)
BUN: 15 mg/dL (ref 8–23)
CO2: 26 mmol/L (ref 22–32)
Calcium: 9.5 mg/dL (ref 8.9–10.3)
Chloride: 103 mmol/L (ref 98–111)
Creatinine, Ser: 0.98 mg/dL (ref 0.61–1.24)
GFR, Estimated: >60 mL/min (ref >60)
Glucose, Bld: 126 mg/dL — ABNORMAL HIGH (ref 70–99)
Potassium: 4 mmol/L (ref 3.5–5.1)
Sodium: 143 mmol/L (ref 135–145)

## 2023-11-18 LAB — PROTIME-INR
INR: 1 (ref 0.8–1.2)
Prothrombin Time: 13.9 s (ref 11.4–15.2)

## 2023-11-18 LAB — D-DIMER, QUANTITATIVE: D-Dimer, Quant: 1.07 ug{FEU}/mL — ABNORMAL HIGH (ref 0.00–0.50)

## 2023-11-18 LAB — GLUCOSE, CAPILLARY: Glucose-Capillary: 265 mg/dL — ABNORMAL HIGH (ref 70–99)

## 2023-11-18 LAB — TSH: TSH: 1.25 u[IU]/mL (ref 0.350–4.500)

## 2023-11-18 LAB — APTT: aPTT: 24 s (ref 24–36)

## 2023-11-18 MED ORDER — METHYLPREDNISOLONE SODIUM SUCC 40 MG IJ SOLR
40.0000 mg | Freq: Once | INTRAMUSCULAR | Status: AC
  Administered 2023-11-18: 40 mg via INTRAVENOUS
  Filled 2023-11-18: qty 1

## 2023-11-18 MED ORDER — SACUBITRIL-VALSARTAN 24-26 MG PO TABS
1.0000 | ORAL_TABLET | Freq: Two times a day (BID) | ORAL | Status: DC
  Administered 2023-11-18 – 2023-11-19 (×2): 1 via ORAL
  Filled 2023-11-18 (×2): qty 1

## 2023-11-18 MED ORDER — DIPHENHYDRAMINE HCL 50 MG/ML IJ SOLN: 50.0000 mg | Freq: Once | INTRAMUSCULAR | Status: AC

## 2023-11-18 MED ORDER — EZETIMIBE 10 MG PO TABS
10.0000 mg | ORAL_TABLET | Freq: Every day | ORAL | Status: DC
  Administered 2023-11-18 – 2023-11-21 (×4): 10 mg via ORAL
  Filled 2023-11-18 (×4): qty 1

## 2023-11-18 MED ORDER — FUROSEMIDE 40 MG PO TABS
40.0000 mg | ORAL_TABLET | Freq: Every day | ORAL | Status: DC
  Administered 2023-11-18 – 2023-11-22 (×5): 40 mg via ORAL
  Filled 2023-11-18 (×5): qty 1

## 2023-11-18 MED ORDER — SODIUM CHLORIDE 0.9 % IV BOLUS
500.0000 mL | Freq: Once | INTRAVENOUS | Status: AC
  Administered 2023-11-18: 500 mL via INTRAVENOUS

## 2023-11-18 MED ORDER — ACETAMINOPHEN 325 MG PO TABS: 650.0000 mg | ORAL_TABLET | ORAL | Status: DC | PRN

## 2023-11-18 MED ORDER — LATANOPROST 0.005 % OP SOLN
1.0000 [drp] | Freq: Every day | OPHTHALMIC | Status: DC
  Administered 2023-11-18 – 2023-11-21 (×4): 1 [drp] via OPHTHALMIC
  Filled 2023-11-18: qty 2.5

## 2023-11-18 MED ORDER — DIPHENHYDRAMINE HCL 25 MG PO CAPS
50.0000 mg | ORAL_CAPSULE | Freq: Once | ORAL | Status: AC
  Administered 2023-11-18: 50 mg via ORAL
  Filled 2023-11-18: qty 2

## 2023-11-18 MED ORDER — NITROGLYCERIN 0.4 MG SL SUBL: 0.4000 mg | SUBLINGUAL_TABLET | SUBLINGUAL | Status: DC | PRN

## 2023-11-18 MED ORDER — INSULIN ASPART 100 UNIT/ML IJ SOLN
0.0000 [IU] | Freq: Three times a day (TID) | INTRAMUSCULAR | Status: DC
  Administered 2023-11-19: 2 [IU] via SUBCUTANEOUS
  Administered 2023-11-19: 3 [IU] via SUBCUTANEOUS
  Administered 2023-11-19: 1 [IU] via SUBCUTANEOUS
  Administered 2023-11-20 (×2): 2 [IU] via SUBCUTANEOUS
  Administered 2023-11-20: 1 [IU] via SUBCUTANEOUS
  Administered 2023-11-21: 2 [IU] via SUBCUTANEOUS
  Administered 2023-11-21: 3 [IU] via SUBCUTANEOUS
  Administered 2023-11-21 – 2023-11-22 (×2): 2 [IU] via SUBCUTANEOUS

## 2023-11-18 MED ORDER — ONDANSETRON HCL 4 MG/2ML IJ SOLN: 4.0000 mg | Freq: Four times a day (QID) | INTRAMUSCULAR | Status: DC | PRN

## 2023-11-18 MED ORDER — INSULIN ASPART 100 UNIT/ML IJ SOLN
0.0000 [IU] | Freq: Every day | INTRAMUSCULAR | Status: DC
  Administered 2023-11-18: 3 [IU] via SUBCUTANEOUS
  Administered 2023-11-21: 4 [IU] via SUBCUTANEOUS

## 2023-11-18 MED ORDER — CLOPIDOGREL BISULFATE 75 MG PO TABS
75.0000 mg | ORAL_TABLET | Freq: Every day | ORAL | Status: DC
  Administered 2023-11-19 – 2023-11-22 (×4): 75 mg via ORAL
  Filled 2023-11-18 (×4): qty 1

## 2023-11-18 MED ORDER — ASPIRIN 81 MG PO TBEC
81.0000 mg | DELAYED_RELEASE_TABLET | Freq: Every morning | ORAL | Status: DC
  Administered 2023-11-19 – 2023-11-20 (×2): 81 mg via ORAL
  Filled 2023-11-18 (×2): qty 1

## 2023-11-18 MED ORDER — ATORVASTATIN CALCIUM 80 MG PO TABS
80.0000 mg | ORAL_TABLET | Freq: Every day | ORAL | Status: DC
  Administered 2023-11-18 – 2023-11-21 (×4): 80 mg via ORAL
  Filled 2023-11-18 (×4): qty 1

## 2023-11-18 MED ORDER — PANTOPRAZOLE SODIUM 40 MG PO TBEC
40.0000 mg | DELAYED_RELEASE_TABLET | Freq: Two times a day (BID) | ORAL | Status: DC
  Administered 2023-11-18 – 2023-11-22 (×8): 40 mg via ORAL
  Filled 2023-11-18 (×8): qty 1

## 2023-11-18 MED ORDER — TAMSULOSIN HCL 0.4 MG PO CAPS
0.4000 mg | ORAL_CAPSULE | Freq: Two times a day (BID) | ORAL | Status: DC
  Administered 2023-11-18 – 2023-11-22 (×8): 0.4 mg via ORAL
  Filled 2023-11-18 (×8): qty 1

## 2023-11-18 MED ORDER — CARVEDILOL 12.5 MG PO TABS
12.5000 mg | ORAL_TABLET | Freq: Two times a day (BID) | ORAL | Status: DC
  Administered 2023-11-18 – 2023-11-22 (×8): 12.5 mg via ORAL
  Filled 2023-11-18 (×8): qty 1

## 2023-11-18 MED ORDER — HEPARIN (PORCINE) 25000 UT/250ML-% IV SOLN
1200.0000 [IU]/h | INTRAVENOUS | Status: DC
  Administered 2023-11-18: 1150 [IU]/h via INTRAVENOUS
  Administered 2023-11-19 – 2023-11-20 (×2): 1300 [IU]/h via INTRAVENOUS
  Administered 2023-11-21: 1200 [IU]/h via INTRAVENOUS
  Filled 2023-11-18 (×4): qty 250

## 2023-11-18 MED ORDER — IOHEXOL 350 MG/ML SOLN
75.0000 mL | Freq: Once | INTRAVENOUS | Status: AC | PRN
  Administered 2023-11-18: 75 mL via INTRAVENOUS

## 2023-11-18 MED ORDER — ISOSORBIDE MONONITRATE ER 30 MG PO TB24
30.0000 mg | ORAL_TABLET | Freq: Every day | ORAL | Status: DC
  Administered 2023-11-19: 30 mg via ORAL
  Filled 2023-11-18: qty 1

## 2023-11-18 MED ORDER — SPIRONOLACTONE 12.5 MG HALF TABLET
12.5000 mg | ORAL_TABLET | Freq: Every day | ORAL | Status: DC
  Administered 2023-11-19 – 2023-11-22 (×4): 12.5 mg via ORAL
  Filled 2023-11-18 (×4): qty 1

## 2023-11-18 MED ORDER — HEPARIN BOLUS VIA INFUSION
4000.0000 [IU] | Freq: Once | INTRAVENOUS | Status: AC
  Administered 2023-11-18: 4000 [IU] via INTRAVENOUS

## 2023-11-18 NOTE — ED Provider Notes (Signed)
 [place] EMERGENCY DEPARTMENT AT [facility] Provider Note   CSN: [id number] Arrival date & time: 11/18/23  1146     Patient presents with: Tachycardia   Sean Villarreal is a 83 y.o. male.   Patient with history CAD, NSTEMI, CABG, T2DM, hypertension presents today with complaints of tachycardia.  Reports that he went to sleep last night in good health and woke up this morning and felt like his heart was racing.  Reports he has some soreness in his shoulder blades which he has had previously with his MIs, but denies any chest pain or shortness of breath. Reports he does feel generally unwell especially when he gets up and walks around. Denies any nausea or vomiting. Denies history of similar symptoms previously. Does report that he had a NSTEMI 1 month ago, has been on aspirin  and Plavix  since then. Denies leg pain or leg swelling, no recent travel or surgeries.   The history is provided by the patient. No language interpreter was used.       Prior to Admission medications   Medication Sig Start Date End Date Taking? Authorizing Provider  aspirin  EC 81 MG tablet Take 81 mg by mouth in the morning. Swallow whole.    [provider]  atorvastatin  (LIPITOR ) 80 MG tablet Take 80 mg by mouth at bedtime. 05/06/12   Edmisten, Lyle KIDD, PA-C  atorvastatin  (LIPITOR ) 80 MG tablet Take 1 tablet (80 mg total) by mouth daily. 01/21/23     carvedilol  (COREG ) 12.5 MG tablet Take 1 tablet (12.5 mg total) by mouth 2 (two) times daily. 10/31/23   Debera Jayson MATSU, MD  clopidogrel  (PLAVIX ) 75 MG tablet Take 1 tablet (75 mg total) by mouth daily. 10/31/23   Debera Jayson MATSU, MD  clopidogrel  (PLAVIX ) 75 MG tablet Take 1 tablet (75 mg total) by mouth daily. 10/26/23   Debera Jayson MATSU, MD  dapagliflozin  propanediol (FARXIGA ) 10 MG TABS tablet Take 1 tablet (10 mg total) by mouth daily before breakfast. 06/17/23   Miriam Norris, NP  dapagliflozin  propanediol (FARXIGA ) 10 MG TABS  tablet Take 1 tablet (10 mg total) by mouth daily before breakfast. 05/23/23   Miriam Norris, NP  dapagliflozin  propanediol (FARXIGA ) 10 MG TABS tablet Take 1 tablet (10 mg total) by mouth daily before breakfast. 06/15/23   Miriam Norris, NP  Dulaglutide (TRULICITY) 0.75 MG/0.5ML SOAJ Inject 0.75 mg as directed once a week. 07/08/23     ezetimibe (ZETIA) 10 MG tablet Take 10 mg by mouth at bedtime. 10/11/23 01/09/24  [provider]  fluticasone  (FLONASE ) 50 MCG/ACT nasal spray Place 1 spray into both nostrils daily as needed for allergies or rhinitis.    [provider]  furosemide  (LASIX ) 20 MG tablet Take 3 tablets (60 mg total) by mouth daily. 09/15/23   Miriam Norris, NP  furosemide  (LASIX ) 40 MG tablet Take 1 tablet (40 mg total) by mouth daily. 10/12/23   Debera Jayson MATSU, MD  glimepiride  (AMARYL ) 1 MG tablet Take 1 tablet (1 mg total) by mouth daily. 01/21/23     isosorbide  mononitrate (IMDUR ) 30 MG 24 hr tablet Take 1 tablet (30 mg total) by mouth daily. 10/12/23   Debera Jayson MATSU, MD  isosorbide  mononitrate (IMDUR ) 30 MG 24 hr tablet Take 1 tablet (30 mg total) by mouth daily. 04/28/23   Debera Jayson MATSU, MD  ketoconazole  (NIZORAL ) 2 % cream Apply 1 Application topically daily as needed for irritation. 06/09/17   [provider]  ketoconazole  (NIZORAL )  2 % cream Apply topically to affected area twice daily. 03/01/23     latanoprost  (XALATAN ) 0.005 % ophthalmic solution Place 1 drop into the left eye at bedtime. 10/06/21   [provider]  Magnesium  400 MG TABS Take 1 tablet by mouth daily. 04/21/22   Debera Jayson MATSU, MD  metFORMIN  (GLUCOPHAGE ) 1000 MG tablet Take 1,000 mg by mouth 2 (two) times daily with a meal.    [provider]  metFORMIN  (GLUCOPHAGE ) 1000 MG tablet Take 1 tablet (1,000 mg total) by mouth 2 (two) times daily. 11/01/23     nitroGLYCERIN  (NITROSTAT ) 0.4 MG SL tablet Place 0.4 mg under the tongue every 5 (five) minutes x 3  doses as needed for chest pain.    [provider]  potassium chloride  SA (KLOR-CON  M) 20 MEQ tablet Take 1 tablet (20 mEq total) by mouth daily. 06/15/23   Miriam Norris, NP  sacubitril -valsartan  (ENTRESTO ) 24-26 MG Take 1 tablet by mouth 2 (two) times daily. 10/31/23   Debera Jayson MATSU, MD  sacubitril -valsartan  (ENTRESTO ) 24-26 MG Take 1 tablet by mouth 2 (two) times daily. 10/26/23   Debera Jayson MATSU, MD  SIMBRINZA 1-0.2 % SUSP Place 1 drop into the left eye 2 (two) times daily. 05/31/22   [provider]  spironolactone  (ALDACTONE ) 25 MG tablet Take 0.5 tablets (12.5 mg total) by mouth daily. 09/15/23 12/14/23  Miriam Norris, NP  spironolactone  (ALDACTONE ) 25 MG tablet Take 0.5 tablets (12.5 mg total) by mouth daily. 04/07/23   Debera Jayson MATSU, MD  spironolactone  (ALDACTONE ) 25 MG tablet Take 0.5 tablets (12.5 mg total) by mouth daily. 09/15/23   Miriam Norris, NP  tamsulosin  (FLOMAX ) 0.4 MG CAPS capsule Take 0.4 mg by mouth 2 (two) times daily.    [provider]  tamsulosin  (FLOMAX ) 0.4 MG CAPS capsule Take 1 capsule (0.4 mg total) by mouth 2 (two) times daily. 01/21/23     triamcinolone cream (KENALOG) 0.1 % Apply topically to itchy skin twice daily. 03/01/23     TRULICITY 0.75 MG/0.5ML SOPN Inject 0.75 mg into the skin once a week. 09/29/21   [provider]    Allergies: Contrast media [iodinated contrast media], Penicillins, and Jardiance  [empagliflozin ]    Review of Systems  Cardiovascular:  Positive for palpitations.  All other systems reviewed and are negative.   Updated Vital Signs BP 135/87   Pulse (!) 115   Temp 98.3 F (36.8 C) (Oral)   Resp 20   Ht 5' 9 (1.753 m)   Wt 85.7 kg   SpO2 98%   BMI 27.91 kg/m   Physical Exam Vitals and nursing note reviewed.  Constitutional:      General: He is not in acute distress.    Appearance: Normal appearance. He is normal weight. He is not ill-appearing, toxic-appearing or diaphoretic.   HENT:     Head: Normocephalic and atraumatic.  Cardiovascular:     Rate and Rhythm: Regular rhythm. Tachycardia present.  Pulmonary:     Effort: Pulmonary effort is normal. No respiratory distress.     Breath sounds: Normal breath sounds.  Abdominal:     General: Abdomen is flat.     Palpations: Abdomen is soft.     Tenderness: There is no abdominal tenderness.  Musculoskeletal:        General: Normal range of motion.     Cervical back: Normal range of motion.     Right lower leg: No edema.     Left lower leg: No  edema.  Skin:    General: Skin is warm and dry.  Neurological:     General: No focal deficit present.     Mental Status: He is alert.  Psychiatric:        Mood and Affect: Mood normal.        Behavior: Behavior normal.     (all labs ordered are listed, but only abnormal results are displayed) Labs Reviewed  BASIC METABOLIC PANEL WITH GFR - Abnormal; Notable for the following components:      Result Value   Glucose, Bld 126 (*)    All other components within normal limits  D-DIMER, QUANTITATIVE - Abnormal; Notable for the following components:   D-Dimer, Quant 1.07 (*)    All other components within normal limits  TROPONIN T, HIGH SENSITIVITY - Abnormal; Notable for the following components:   Troponin T High Sensitivity 132 (*)    All other components within normal limits  CBC  TSH  TROPONIN T, HIGH SENSITIVITY    EKG: EKG Interpretation Date/Time:  Friday November 18 2023 11:57:39 EDT Ventricular Rate:  121 PR Interval:  113 QRS Duration:  143 QT Interval:  348 QTC Calculation: 494 R Axis:   -84  Text Interpretation: Sinus tachycardia RBBB and LAFB Confirmed by Elnor Savant ([id number]) on 11/18/2023 12:05:33 PM  Radiology: DG Chest 2 View Result Date: 11/18/2023 EXAM: 2 VIEW(S) XRAY OF THE CHEST 11/18/2023 12:33:25 PM COMPARISON: 03/31/22 CLINICAL HISTORY: chest pain. pt states that he has a hx of heart attack and stents and a heart cath, states that he  is here today because he woke up with a racing heart beat, states that it got a little bit better when he was laying down, but got worse when he was walking around, ; denies chest pain at this time FINDINGS: LUNGS AND PLEURA: No focal pulmonary opacity. No pulmonary edema. No pleural effusion. No pneumothorax. HEART AND MEDIASTINUM: Status post CABG. Aortic arch calcification. No acute abnormality of the cardiac and mediastinal silhouettes. BONES AND SOFT TISSUES: Overlying monitor wires. Intact median sternotomy wires. Mild thoracic spondylosis. IMPRESSION: 1. No acute cardiopulmonary pathology. Electronically signed by: Waddell Calk MD 11/18/2023 12:57 PM EDT RP Workstation: [id number]     .Critical Care  Performed by: Nora Lauraine LABOR, PA-C Authorized by: Cheridan Kibler A, PA-C   Critical care provider statement:    Critical care time (minutes):  100   Critical care was necessary to treat or prevent imminent or life-threatening deterioration of the following conditions:  Circulatory failure and cardiac failure   Critical care was time spent personally by me on the following activities:  Development of treatment plan with patient or surrogate, discussions with consultants, discussions with primary provider, evaluation of patient's response to treatment, examination of patient, obtaining history from patient or surrogate, ordering and review of laboratory studies, ordering and review of radiographic studies, pulse oximetry, re-evaluation of patient's condition and review of old charts   Care discussed with: admitting provider      Medications Ordered in the ED  heparin  bolus via infusion 4,000 Units (has no administration in time range)  heparin  ADULT infusion 100 units/mL (25000 units/250mL) (has no administration in time range)  sodium chloride  0.9 % bolus 500 mL (has no administration in time range)  methylPREDNISolone  sodium succinate (SOLU-MEDROL ) 40 mg/mL injection 40 mg (40 mg Intravenous  Given 11/18/23 1306)  diphenhydrAMINE  (BENADRYL ) capsule 50 mg (50 mg Oral Given 11/18/23 1618)    Or  diphenhydrAMINE  (BENADRYL ) injection 50  mg ( Intravenous See Alternative 11/18/23 1618)  iohexol  (OMNIPAQUE ) 350 MG/ML injection 75 mL (75 mLs Intravenous Contrast Given 11/18/23 1710)                                    Medical Decision Making Amount and/or Complexity of Data Reviewed Labs: ordered. Radiology: ordered.  Risk Prescription drug management. Decision regarding hospitalization.   This patient is a 83 y.o. male who presents to the ED for concern of tachycardia, this involves an extensive number of treatment options, and is a complaint that carries with it a high risk of complications and morbidity. The emergent differential diagnosis prior to evaluation includes, but is not limited to,  Cardiac arrhythmias, ACS, PE, CHF, pericarditis, valvular disease, panic/anxiety, ETOH, stimulant use, medication side effect, anemia, hyperthyroidism, pulmonary embolism.  This is not an exhaustive differential.   Past Medical History / Co-morbidities / Social History:  has a past medical history of Aortic stenosis, Arthritis, Basal cell carcinoma, Coronary artery disease, Essential hypertension, GERD (gastroesophageal reflux disease), Hematuria, History of blood transfusion (09/2012), History of kidney stones, Hyperlipidemia LDL goal <70, NSTEMI (non-ST elevated myocardial infarction) (HCC) (08/2012), Post-op Afib (HCC), and Type II diabetes mellitus (HCC).  Additional history: Chart reviewed. Pertinent results include: patient had NSTEMI on 10/10/23, had mid RCA DES placed   Physical Exam: Physical exam performed. The pertinent findings include: generally well appearing, tachycardia noted. No other acute physical exam abnormalities  Lab Tests: I ordered, and personally interpreted labs.  The pertinent results include:  troponin 132 --> 183, d dimer 1.07   Imaging Studies: I ordered  imaging studies including CXR, CTA. I independently visualized and interpreted imaging which showed   CXR: NAD  CTA: no acute findings  I agree with the radiologist interpretation.   Cardiac Monitoring:  The patient was maintained on a cardiac monitor.  My attending physician Dr. Elnor viewed and interpreted the cardiac monitored which showed an underlying rhythm of: sinus tachycardia. I agree with this interpretation.   Medications: I ordered medication including benadryl , solu-medrol   for pre-med for CT. Reevaluation of the patient after these medicines showed that the patient stayed the same. I have reviewed the patients home medicines and have made adjustments as needed.  Consultations Obtained: I requested consultation with cardiology on call Dr. Kate,  and discussed lab and imaging findings as well as pertinent plan - they recommend: admit to medicine, transfer to [facility]. Recommends heparin    Disposition: After consideration of the diagnostic results and the patients response to treatment, I feel that patient will require admission for uptrending troponin, tachycardia per cardiology recommendations.   Discussed patient with hospitalist Dr. Sherlon who accepts patient for admission.  Final diagnoses:  Tachycardia  Elevated troponin    ED Discharge Orders     None          Nora Lauraine DELENA DEVONNA 11/18/23 1926    Elnor Jayson DELENA, DO 11/23/23 [phone]

## 2023-11-18 NOTE — ED Notes (Signed)
 Date and time results received: 11/18/23 1256  Test: Troponin Critical Value: 132  Name of Provider Notified: Nora Domino, PA-C

## 2023-11-18 NOTE — ED Triage Notes (Signed)
 Pt to er room number 9, pt states that he has a hx of heart attach and sents and a heart cath, states that he is here today because he woke up with a racing heart beat, states that it got a little bit better when he was laying down, but got worse when he was walking around, denies chest pain at this time.

## 2023-11-18 NOTE — Consult Note (Signed)
 PHARMACY - ANTICOAGULATION CONSULT NOTE  Pharmacy Consult for heparin  infusion Indication: atrial fibrillation  Allergies  Allergen Reactions   Contrast Media [Iodinated Contrast Media] Hives and Other (See Comments)    Had welts on arm, and kidney issues after given dye in the past   Jardiance  [Empagliflozin ] Diarrhea   Penicillins Other (See Comments)    Passed out as a child     Patient Measurements: Height: 5' 9 (175.3 cm) Weight: 85.7 kg (189 lb) IBW/kg (Calculated) : 70.7 HEPARIN  DW (KG): 85.7  Vital Signs: Temp: 98.8 F (37.1 C) (10/24 1730) Temp Source: Oral (10/24 1730) BP: 123/86 (10/24 1845) Pulse Rate: 110 (10/24 1845)  Labs: Recent Labs    11/16/23 0942 11/18/23 1201  HGB  --  15.6  HCT  --  48.1  PLT  --  166  CREATININE 0.93 0.98    Estimated Creatinine Clearance: 63 mL/min (by C-G formula based on SCr of 0.98 mg/dL).   Medical History: Past Medical History:  Diagnosis Date   Aortic stenosis    a. 03/2022 Echo: EF 60-65%, no rwma, mild LVH, nl RV fxn, mild-mod AS.   Arthritis    Basal cell carcinoma    Coronary artery disease    a. DES to LAD and LCx April 2014 b. PTCA ostial circumflex 03/2016 due to ISR c.  9/18 PCI/DESx2 osital Lcx (ISR), p/mLCx  d. s/p CABG on 01/24/2020 with LIMA-LAD and SVG-OM.   Essential hypertension    GERD (gastroesophageal reflux disease)    Hematuria    History of blood transfusion 09/2012   History of kidney stones    Hyperlipidemia LDL goal <70    NSTEMI (non-ST elevated myocardial infarction) (HCC) 08/2012   Post-op Afib (HCC)    a. 12/2019-01/2020 following CABG. Briefly on amio.   Type II diabetes mellitus (HCC)     Medications:  No home anticoagulants per pharmacist review  Assessment: 39 you male presented to ED with complaint of racing heart beat.  PMH includes CAD s/p CABG, T2DM, and HTN.  Pharmacy consulted to initiate heparin  infusion due to concern of atrial fibrillation.  Baseline aPTT and  PT/INR ordered  Goal of Therapy:  Heparin  level 0.3-0.7 units/ml Monitor platelets by anticoagulation protocol: Yes   Plan:  Give 4000 units bolus x 1 Start heparin  infusion at 1150 units/hr Check anti-Xa level in 8 hours and daily while on heparin  Continue to monitor H&H and platelets  Kayla JULIANNA Blew, PharmD, BCPS 11/18/2023,7:05 PM

## 2023-11-18 NOTE — H&P (Signed)
 [facility] H&P   Patient Demographics:    Sean Villarreal, is a 83 y.o. male  MRN: [id number]   DOB - Jun 15, 1940  Admit Date - 11/18/2023  Outpatient Primary MD for the patient is Job Bolt, [place]  Referring MD/NP/PA: Dr Franklyn  Patient coming from: home  Chief Complaint  Patient presents with   Tachycardia      HPI:    Sean Villarreal  is a 83 y.o. male, with PMHx of HTN, T2DM, CAD s/p 2 DES to proximal to mid circumflex in 2018, CABG (LIMA-LAD, SVG- OM) in 2021, HFrEF, BPH, GERD, and OA , [facility] underwent left heart cath that showed multivessel stenosis requiring lithotripsy and DES stent to the mid-RCA.  -Presents to ED secondary to complaints of generalized weakness, fatigue, and tachycardia, he does report some soreness in his shoulder blades, which resembles his previous MIs, but he denies any specific chest pain or shortness of breath, reports he has been feeling unwell, fatigued since his recent discharge. - ED his workup significant for troponin trending up 132> 183, D-dimers were elevated at 1.07, but CTA chest negative for PE, his EKG nephric and for RBBB, LAFB with sinus tachycardia, ED discussed with cardiology, who recommended admission to [facility], and starting on heparin  drip due to concern for NSTEMI, but requested admission under [facility].      Review of systems:     A full 10 point Review of Systems was done, except as stated above, all other Review of Systems were negative.   With Past History of the following :    Past Medical History:  Diagnosis Date   Aortic stenosis    a. 03/2022 Echo: EF 60-65%, no rwma, mild LVH, nl RV fxn, mild-mod AS.   Arthritis    Basal cell carcinoma    Coronary artery disease    a. DES to LAD and LCx April 2014 b. PTCA ostial circumflex 03/2016 due to ISR c.   9/18 PCI/DESx2 osital Lcx (ISR), p/mLCx  d. s/p CABG on 01/24/2020 with LIMA-LAD and SVG-OM.   Essential hypertension    GERD (gastroesophageal reflux disease)    Hematuria    History of blood transfusion 09/2012   History of kidney stones    Hyperlipidemia LDL goal <70    NSTEMI (non-ST elevated myocardial infarction) (HCC) 08/2012   Post-op Afib (HCC)    a. 12/2019-01/2020 following CABG. Briefly on amio.   Type II diabetes mellitus (HCC)       Past Surgical History:  Procedure Laterality Date   BASAL CELL CARCINOMA EXCISION     right cheek; both shoulders   CARDIAC CATHETERIZATION     CATARACT EXTRACTION W/ INTRAOCULAR LENS  IMPLANT, BILATERAL Bilateral    COLONOSCOPY N/A 03/19/2016   Procedure: COLONOSCOPY;  Surgeon: Claudis RAYMOND Rivet, MD;  Location: [facility];  Service: Endoscopy;  Laterality: N/A;  9:15   CORONARY ARTERY BYPASS GRAFT N/A 01/24/2020   Procedure: CORONARY ARTERY BYPASS GRAFTING (CABG), ON PUMP, TIMES TWO, USING LEFT INTERNAL MAMMARY ARTERY AND ENDOSCOPICALLY HARVESTED RIGHT GREATER SAPHENOUS VEIN;  Surgeon: Kerrin Elspeth BROCKS, MD;  Location: [facility] OR;  Service: Open Heart Surgery;  Laterality: N/A;   CORONARY BALLOON ANGIOPLASTY N/A 04/15/2016   Procedure: Coronary Balloon Angioplasty;  Surgeon: Peter M Swaziland, MD;  Location: [facility] INVASIVE CV LAB;  Service: Cardiovascular;  Laterality: N/A;   CORONARY STENT INTERVENTION N/A 10/11/2016   Procedure: CORONARY STENT INTERVENTION;  Surgeon: Dann Candyce RAMAN, MD;  Location: [facility] INVASIVE CV LAB;  Service: Cardiovascular;  Laterality: N/A;   CYSTOSCOPY N/A 12/30/2022   Procedure: CYSTOSCOPY;  Surgeon: Sherrilee Belvie CROME, MD;  Location: [facility] ORS;  Service: Urology;  Laterality: N/A;   CYSTOSCOPY W/ URETEROSCOPY W/ LITHOTRIPSY  10/2012   /notes 11/10/2012   CYSTOSCOPY WITH STENT PLACEMENT  08/2012; 09/2012   EYE SURGERY Left ~ 02/2016   for crinkled lens   INGUINAL HERNIA REPAIR Left 12/24/2020   Procedure: HERNIA REPAIR  INGUINAL WITH MESH;  Surgeon: Kallie Manuelita BROCKS, MD;  Location: [facility] ORS;  Service: General;  Laterality: Left;   LEFT HEART CATH AND CORONARY ANGIOGRAPHY N/A 04/15/2016   Procedure: Left Heart Cath and Coronary Angiography;  Surgeon: Peter M Swaziland, MD;  Location: [facility] INVASIVE CV LAB;  Service: Cardiovascular;  Laterality: N/A;   LEFT HEART CATH AND CORONARY ANGIOGRAPHY N/A 10/11/2016   Procedure: LEFT HEART CATH AND CORONARY ANGIOGRAPHY;  Surgeon: Dann Candyce RAMAN, MD;  Location: [facility] INVASIVE CV LAB;  Service: Cardiovascular;  Laterality: N/A;   LEFT HEART CATH AND CORONARY ANGIOGRAPHY N/A 03/31/2018   Procedure: LEFT HEART CATH AND CORONARY ANGIOGRAPHY;  Surgeon: Burnard Debby LABOR, MD;  Location: [facility] INVASIVE CV LAB;  Service: Cardiovascular;  Laterality: N/A;   LEFT HEART CATH AND CORONARY ANGIOGRAPHY N/A 01/21/2020   Procedure: LEFT HEART CATH AND CORONARY ANGIOGRAPHY;  Surgeon: Claudene Victory ORN, MD;  Location: [facility] INVASIVE CV LAB;  Service: Cardiovascular;  Laterality: N/A;   LEFT HEART CATHETERIZATION WITH CORONARY ANGIOGRAM N/A 05/05/2012   Procedure: LEFT HEART CATHETERIZATION WITH CORONARY ANGIOGRAM;  Surgeon: Lonni JONETTA Cash, MD;  Location[facility] C[facility] LAB;  Service: Cardiovascular;  Laterality: N/A;   LEFT HEART CATHETERIZATION WITH CORONARY ANGIOGRAM N/A 05/15/2012   Procedure: LEFT HEART CATHETERIZATION WITH CORONARY ANGIOGRAM;  Surgeon: Lonni JONETTA Cash, MD;  Location: [facility] CATH LAB;  Service: Cardiovascular;  Laterality: N/A;   TEE WITHOUT CARDIOVERSION N/A 01/24/2020   Procedure: TRANSESOPHAGEAL ECHOCARDIOGRAM (TEE);  Surgeon: Kerrin Elspeth BROCKS, MD;  Location: [facility] OR;  Service: Open Heart Surgery;  Laterality: N/A;   TONSILLECTOMY  1947   TRANSURETHRAL RESECTION OF PROSTATE N/A 12/30/2022   Procedure: TRANSURETHRAL RESECTION OF THE PROSTATE (TURP);  Surgeon: Sherrilee Belvie CROME, MD;  Location: [facility] ORS;  Service: Urology;  Laterality: N/A;      Social History:     Social History    Tobacco Use   Smoking status: Former    Current packs/day: 0.00    Average packs/day: 0.8 packs/day for 30.0 years (22.5 ttl pk-yrs)    Types: Cigarettes    Start date: 01/25/1982    Quit date: 11/05/1988    Years since quitting: 35.0   Smokeless tobacco: Never  Substance Use Topics   Alcohol  use: Not Currently    Alcohol /week: 0.0 standard drinks of alcohol        Family History :     Family  History  Problem Relation Age of Onset   Heart disease Mother    Heart failure Mother    Heart disease Father    Heart attack Father    Colon cancer Neg Hx      Home Medications:   Prior to Admission medications   Medication Sig Start Date End Date Taking? Authorizing Provider  acetaminophen  (TYLENOL ) 500 MG tablet Take 1,000 mg by mouth every 6 (six) hours as needed for mild pain (pain score 1-3).   Yes [provider]  aspirin  EC 81 MG tablet Take 81 mg by mouth in the morning. Swallow whole.   Yes [provider]  atorvastatin  (LIPITOR ) 80 MG tablet Take 1 tablet (80 mg total) by mouth daily. Patient taking differently: Take 80 mg by mouth at bedtime. 01/21/23  Yes   carvedilol  (COREG ) 12.5 MG tablet Take 1 tablet (12.5 mg total) by mouth 2 (two) times daily. 10/31/23  Yes Debera Jayson MATSU, MD  clopidogrel  (PLAVIX ) 75 MG tablet Take 1 tablet (75 mg total) by mouth daily. 10/31/23  Yes Debera Jayson MATSU, MD  Dulaglutide (TRULICITY) 0.75 MG/0.5ML SOAJ Inject 0.75 mg as directed once a week. Patient taking differently: Inject 0.75 mg as directed every Friday. 07/08/23  Yes   ezetimibe (ZETIA) 10 MG tablet Take 10 mg by mouth at bedtime. 10/11/23 01/09/24 Yes [provider]  fluticasone  (FLONASE ) 50 MCG/ACT nasal spray Place 1 spray into both nostrils daily as needed for allergies or rhinitis.   Yes [provider]  furosemide  (LASIX ) 40 MG tablet Take 1 tablet (40 mg total) by mouth daily. 10/12/23  Yes Debera Jayson MATSU, MD  glimepiride  (AMARYL ) 1  MG tablet Take 1 tablet (1 mg total) by mouth daily. 01/21/23  Yes   isosorbide  mononitrate (IMDUR ) 30 MG 24 hr tablet Take 1 tablet (30 mg total) by mouth daily. 04/28/23  Yes Debera Jayson MATSU, MD  ketoconazole  (NIZORAL ) 2 % cream Apply topically to affected area twice daily. Patient taking differently: Apply 1 Application topically daily. 03/01/23  Yes   latanoprost  (XALATAN ) 0.005 % ophthalmic solution Place 1 drop into the left eye at bedtime. 10/06/21  Yes [provider]  Magnesium  400 MG TABS Take 1 tablet by mouth daily. 04/21/22  Yes Debera Jayson MATSU, MD  metFORMIN  (GLUCOPHAGE ) 1000 MG tablet Take 1 tablet (1,000 mg total) by mouth 2 (two) times daily. 11/01/23  Yes   nitroGLYCERIN  (NITROSTAT ) 0.4 MG SL tablet Place 0.4 mg under the tongue every 5 (five) minutes x 3 doses as needed for chest pain.   Yes [provider]  sacubitril -valsartan  (ENTRESTO ) 24-26 MG Take 1 tablet by mouth 2 (two) times daily. 10/31/23  Yes Debera Jayson MATSU, MD  SIMBRINZA 1-0.2 % SUSP Place 1 drop into the left eye 2 (two) times daily. 05/31/22  Yes [provider]  spironolactone  (ALDACTONE ) 25 MG tablet Take 0.5 tablets (12.5 mg total) by mouth daily. 09/15/23 12/14/23 Yes Miriam Norris, NP  tamsulosin  (FLOMAX ) 0.4 MG CAPS capsule Take 1 capsule (0.4 mg total) by mouth 2 (two) times daily. 01/21/23  Yes   clopidogrel  (PLAVIX ) 75 MG tablet Take 1 tablet (75 mg total) by mouth daily. Patient not taking: Reported on 11/18/2023 10/26/23   Debera Jayson MATSU, MD  dapagliflozin  propanediol (FARXIGA ) 10 MG TABS tablet Take 1 tablet (10 mg total) by mouth daily before breakfast. Patient not taking: Reported on 11/18/2023 05/23/23   Miriam Norris, NP  isosorbide  mononitrate (IMDUR ) 30 MG 24 hr  tablet Take 1 tablet (30 mg total) by mouth daily. Patient not taking: Reported on 11/18/2023 10/12/23   Debera Jayson MATSU, MD  potassium chloride  SA (KLOR-CON  M) 20 MEQ tablet Take 1 tablet (20 mEq total)  by mouth daily. Patient not taking: Reported on 11/18/2023 06/15/23   Miriam Norris, NP  sacubitril -valsartan  (ENTRESTO ) 24-26 MG Take 1 tablet by mouth 2 (two) times daily. Patient not taking: Reported on 11/18/2023 10/26/23   Debera Jayson MATSU, MD  spironolactone  (ALDACTONE ) 25 MG tablet Take 0.5 tablets (12.5 mg total) by mouth daily. Patient not taking: Reported on 11/18/2023 04/07/23   Debera Jayson MATSU, MD  spironolactone  (ALDACTONE ) 25 MG tablet Take 0.5 tablets (12.5 mg total) by mouth daily. Patient not taking: Reported on 11/18/2023 09/15/23   Miriam Norris, NP  triamcinolone cream (KENALOG) 0.1 % Apply topically to itchy skin twice daily. Patient not taking: Reported on 11/18/2023 03/01/23        Allergies:     Allergies  Allergen Reactions   Contrast Media [Iodinated Contrast Media] Hives and Other (See Comments)    Had welts on arm, and kidney issues after given dye in the past   Jardiance  [Empagliflozin ] Diarrhea   Penicillins Other (See Comments)    Passed out as a child      Physical Exam:   Vitals  Blood pressure 129/89, pulse (!) 114, temperature 98.8 F (37.1 C), temperature source Oral, resp. rate 18, height 5' 9 (1.753 m), weight 85.7 kg, SpO2 96%.   1. General Developed male, laying in bed, no apparent distress  2. Normal affect and insight, Not Suicidal or Homicidal, Awake Alert, Oriented X 3.  3. No F.N deficits, ALL C.Nerves Intact, Strength 5/5 all 4 extremities, Sensation intact all 4 extremities, Plantars down going.  4. Ears and Eyes appear Normal, Conjunctivae clear, PERRLA. Moist Oral Mucosa.  5. Supple Neck, No Carotid Bruits.  6. Symmetrical Chest wall movement, Good air movement bilaterally, CTAB.  7.  Tachycardic, No Gallops, Rubs or Murmurs, No Parasternal Heave.  8. Positive Bowel Sounds, Abdomen Soft, No tenderness, No organomegaly appriciated,No rebound -guarding or rigidity.  9.  No Cyanosis, Normal Skin Turgor, No Skin Rash  or Bruise.  10. Good muscle tone,  joints appear normal , no effusions, Normal ROM.    Data Review:    CBC Recent Labs  Lab 11/18/23 1201  WBC 10.4  HGB 15.6  HCT 48.1  PLT 166  MCV 96.0  MCH 31.1  MCHC 32.4  RDW 13.2   ------------------------------------------------------------------------------------------------------------------  Chemistries  Recent Labs  Lab 11/16/23 0942 11/18/23 1201  NA 140 143  K 3.8 4.0  CL 103 103  CO2 26 26  GLUCOSE 132* 126*  BUN 17 15  CREATININE 0.93 0.98  CALCIUM  9.2 9.5   ------------------------------------------------------------------------------------------------------------------ estimated creatinine clearance is 63 mL/min (by C-G formula based on SCr of 0.98 mg/dL). ------------------------------------------------------------------------------------------------------------------ Recent Labs    11/18/23 1201  TSH 1.250    Coagulation profile Recent Labs  Lab 11/18/23 1923  INR 1.0   ------------------------------------------------------------------------------------------------------------------- Recent Labs    11/18/23 1201  DDIMER 1.07*   -------------------------------------------------------------------------------------------------------------------  Cardiac Enzymes No results for input(s): CKMB, TROPONINI, MYOGLOBIN in the last 168 hours.  Invalid input(s): CK ------------------------------------------------------------------------------------------------------------------    Component Value Date/Time   BNP 143.0 (H) 03/31/2022 0824     ---------------------------------------------------------------------------------------------------------------  Urinalysis    Component Value Date/Time   COLORURINE YELLOW 07/30/2022 1018   APPEARANCEUR Clear 07/22/2023 0938   LABSPEC 1.021 07/30/2022 1018  PHURINE 6.0 07/30/2022 1018   GLUCOSEU 3+ (A) 07/22/2023 0938   HGBUR NEGATIVE 07/30/2022 1018    BILIRUBINUR Negative 07/22/2023 0938   KETONESUR NEGATIVE 07/30/2022 1018   PROTEINUR Negative 07/22/2023 0938   PROTEINUR NEGATIVE 07/30/2022 1018   NITRITE Negative 07/22/2023 0938   NITRITE NEGATIVE 07/30/2022 1018   LEUKOCYTESUR Negative 07/22/2023 0938   LEUKOCYTESUR NEGATIVE 07/30/2022 1018    ----------------------------------------------------------------------------------------------------------------   Imaging Results:    CT Angio Chest PE W and/or Wo Contrast Result Date: 11/18/2023 CLINICAL DATA:  Pulmonary embolism (PE) suspected, low to intermediate prob, positive D-dimer EXAM: CT ANGIOGRAPHY CHEST WITH CONTRAST TECHNIQUE: Multidetector CT imaging of the chest was performed using the standard protocol during bolus administration of intravenous contrast. Multiplanar CT image reconstructions and MIPs were obtained to evaluate the vascular anatomy. RADIATION DOSE REDUCTION: This exam was performed according to the departmental dose-optimization program which includes automated exposure control, adjustment of the mA and/or kV according to patient size and/or use of iterative reconstruction technique. CONTRAST:  75mL OMNIPAQUE  IOHEXOL  350 MG/ML SOLN COMPARISON:  04/01/2022 FINDINGS: Pulmonary Embolism: No pulmonary embolism. Cardiovascular: Mild cardiomegaly. Dense multi-vessel coronary atherosclerosis with postsurgical changes of a prior CABG. No pericardial effusion.No aortic aneurysm. Normal variant 2 vessel aortic arch anatomy. Scattered calcified atherosclerosis throughout the aorta without stenosis. Mediastinum/Nodes: No mediastinal mass.No mediastinal, hilar, or axillary lymphadenopathy. Lungs/Pleura: The midline trachea and bronchi are patent. No focal airspace consolidation, pleural effusion, or pneumothorax. Posterior bibasilar dependent atelectasis. Musculoskeletal: No acute fracture or destructive bone lesion. Sternotomy wires. Multilevel degenerative disc disease of the  spine. Upper Abdomen: No acute abnormality in the partially visualized upper abdomen. Review of the MIP images confirms the above findings. IMPRESSION: No acute intrathoracic abnormality; specifically, no pulmonary embolism, pneumonia, or pleural effusion. Aortic Atherosclerosis (ICD10-I70.0). Electronically Signed   By: Rogelia Myers M.D.   On: 11/18/2023 18:19   DG Chest 2 View Result Date: 11/18/2023 EXAM: 2 VIEW(S) XRAY OF THE CHEST 11/18/2023 12:33:25 PM COMPARISON: 03/31/22 CLINICAL HISTORY: chest pain. pt states that he has a hx of heart attack and stents and a heart cath, states that he is here today because he woke up with a racing heart beat, states that it got a little bit better when he was laying down, but got worse when he was walking around, ; denies chest pain at this time FINDINGS: LUNGS AND PLEURA: No focal pulmonary opacity. No pulmonary edema. No pleural effusion. No pneumothorax. HEART AND MEDIASTINUM: Status post CABG. Aortic arch calcification. No acute abnormality of the cardiac and mediastinal silhouettes. BONES AND SOFT TISSUES: Overlying monitor wires. Intact median sternotomy wires. Mild thoracic spondylosis. IMPRESSION: 1. No acute cardiopulmonary pathology. Electronically signed by: Waddell Calk MD 11/18/2023 12:57 PM EDT RP Workstation: [id number]     EKG:  Vent. rate 121 BPM PR interval 113 ms QRS duration 143 ms QT/QTcB 348/494 ms P-R-T axes 86 -84 49 Sinus tachycardia RBBB and LAFB  Assessment & Plan:    Principal Problem:   NSTEMI (non-ST elevated myocardial infarction) (HCC) Active Problems:   Type II diabetes mellitus (HCC)   Hypertension   GERD (gastroesophageal reflux disease)   NSTEMI Ischemic cardiomyopathy with HFrEF - Patient with recent hospitalization at [facility] 9/15 patient underwent left heart cath that showed multivessel stenosis requiring lithotripsy and DES stent to the mid-RCA.  - Troponins trending up, concern for ACS -Commendation  by cardiology to admit to [facility]. - Continue with heparin  drip. - Continue with home medications including Coreg ,  Entresto , Aldactone . - Continue with home statin and Zetia - Continue with home aspirin  and Plavix . - Further recommendation per cardiology when patient gets to [facility].  Hypertension - Continue with home medications  Diabetes mellitus type 2 - Check A1c - Hold home Trulicity and Amaryl  - Will keep on insulin  sliding scale  BPH - Continue with Flomax   GERD - Continue with PPI   DVT Prophylaxis Heparin  drip  AM Labs Ordered, also please review Full Orders  Family Communication: Admission, patients condition and plan of care including tests being ordered have been discussed with the patient  who indicate understanding and agree with the plan and Code Status.  Code Status Full  Likely DC to  home  Consults called: ED discussed with cardiology Dr. Barnetta at [facility]  Admission status: Inpatient  Time spent in minutes : 70 minutes   Brayton Lye M.D on 11/18/2023 at 9:10 PM   [facility] - Office  [phone]

## 2023-11-19 ENCOUNTER — Inpatient Hospital Stay (HOSPITAL_COMMUNITY)

## 2023-11-19 DIAGNOSIS — R7989 Other specified abnormal findings of blood chemistry: Secondary | ICD-10-CM | POA: Diagnosis not present

## 2023-11-19 DIAGNOSIS — I214 Non-ST elevation (NSTEMI) myocardial infarction: Secondary | ICD-10-CM

## 2023-11-19 DIAGNOSIS — R000 Tachycardia, unspecified: Secondary | ICD-10-CM | POA: Diagnosis not present

## 2023-11-19 DIAGNOSIS — R079 Chest pain, unspecified: Secondary | ICD-10-CM | POA: Diagnosis not present

## 2023-11-19 LAB — HEPARIN LEVEL (UNFRACTIONATED)
Heparin Unfractionated: 0.29 [IU]/mL — ABNORMAL LOW (ref 0.30–0.70)
Heparin Unfractionated: 0.35 [IU]/mL (ref 0.30–0.70)
Heparin Unfractionated: 0.37 [IU]/mL (ref 0.30–0.70)

## 2023-11-19 LAB — ECHOCARDIOGRAM COMPLETE
AR max vel: 0.9 cm2
AV Area VTI: 0.86 cm2
AV Area mean vel: 0.86 cm2
AV Mean grad: 18 mmHg
AV Peak grad: 30 mmHg
Ao pk vel: 2.74 m/s
Area-P 1/2: 3.31 cm2
Height: 69 in
MV VTI: 1.72 cm2
S' Lateral: 4 cm
Single Plane A4C EF: 35.2 %
Weight: 2998.26 [oz_av]

## 2023-11-19 LAB — CBC
HCT: 45.1 % (ref 39.0–52.0)
Hemoglobin: 15.1 g/dL (ref 13.0–17.0)
MCH: 31.3 pg (ref 26.0–34.0)
MCHC: 33.5 g/dL (ref 30.0–36.0)
MCV: 93.4 fL (ref 80.0–100.0)
Platelets: 184 K/uL (ref 150–400)
RBC: 4.83 MIL/uL (ref 4.22–5.81)
RDW: 13.2 % (ref 11.5–15.5)
WBC: 12 K/uL — ABNORMAL HIGH (ref 4.0–10.5)
nRBC: 0 % (ref 0.0–0.2)

## 2023-11-19 LAB — GLUCOSE, CAPILLARY
Glucose-Capillary: 138 mg/dL — ABNORMAL HIGH (ref 70–99)
Glucose-Capillary: 202 mg/dL — ABNORMAL HIGH (ref 70–99)
Glucose-Capillary: 169 mg/dL — ABNORMAL HIGH (ref 70–99)
Glucose-Capillary: 153 mg/dL — ABNORMAL HIGH (ref 70–99)

## 2023-11-19 LAB — TROPONIN I (HIGH SENSITIVITY)
Troponin I (High Sensitivity): 960 ng/L (ref <18)
Troponin I (High Sensitivity): 1069 ng/L (ref <18)

## 2023-11-19 MED ORDER — BRINZOLAMIDE 1 % OP SUSP
1.0000 [drp] | Freq: Two times a day (BID) | OPHTHALMIC | Status: DC
  Administered 2023-11-19 – 2023-11-22 (×7): 1 [drp] via OPHTHALMIC
  Filled 2023-11-19: qty 10

## 2023-11-19 MED ORDER — BRIMONIDINE TARTRATE 0.2 % OP SOLN
1.0000 [drp] | Freq: Two times a day (BID) | OPHTHALMIC | Status: DC
Start: 2023-11-19 — End: 2023-11-22
  Administered 2023-11-19 – 2023-11-22 (×7): 1 [drp] via OPHTHALMIC
  Filled 2023-11-19: qty 5

## 2023-11-19 NOTE — Progress Notes (Signed)
 PHARMACY - ANTICOAGULATION CONSULT NOTE  Pharmacy Consult for heparin  Indication: NSTEMI  Labs: Recent Labs    11/18/23 1201 11/18/23 1923 11/19/23 0317 11/19/23 0900 11/19/23 1104 11/19/23 2028  HGB 15.6  --  15.1  --   --   --   HCT 48.1  --  45.1  --   --   --   PLT 166  --  184  --   --   --   APTT  --  24  --   --   --   --   LABPROT  --  13.9  --   --   --   --   INR  --  1.0  --   --   --   --   HEPARINUNFRC  --   --  0.29*  --  0.35 0.37  CREATININE 0.98  --   --   --   --   --   TROPONINIHS  --   --   --  960* 1,069*  --    Assessment: 83yo male subtherapeutic on heparin  with initial dosing for NSTEMI.  10/25: HL therapeutic at 0.35 on 1300 units/hr. No issues with heparin  or s/sx of bleeding per nurse. CBC stable.   10/25 PM: HL remains therapeutic at 0.37. No issues with the infusion or bleeding reported.   Goal of Therapy:  Heparin  level 0.3-0.7 units/ml Monitor platelets by anticoagulation protocol: Yes    Plan:  Continue heparin  rate of 1300 units/hr Daily anti-Xa and CBC  Rocky Slade, PharmD, BCPS Please refer to [facility] for [facility] Pharmacy numbers 11/19/2023 9:24 PM

## 2023-11-19 NOTE — Consult Note (Signed)
 Cardiology Consultation   Patient ID: [name] MRN: [id number]; DOB: August 18, 1940  Admit date: 11/18/2023 Date of Consult: 11/19/2023  PCP:  Job Bolt, PA   [facility] Providers Cardiologist:  Jayson Sierras, MD     Patient Profile: Sean Villarreal is a 83 y.o. male with a hx of ICM with HFrEF, MI in 2014 with PCI, CAD with PCI Lcx in 2018 and 2v CABG in 2021 (LIMA-LAD, SVG-OM) htn, DM, BPH, GERD, OA who is being seen 11/19/2023 for the evaluation of chest pain at the request of Dr. Devolt.  History of Present Illness: Sean Villarreal states he started feeling unwell since he had his MI in 10/10/23 where he had a DES placed to his RCA.   He has felt palpitations and noted his HR to be ~130 which prompted him to present to the ED. He also reports pain in his shoulder blades which is consistent with his prior angina equivalent. He feels okay sitting here in bed and does not have any pain. Has occasional shortness of breath with walking but no shortness of breath currently. No swelling or weight gain. He does not lie flat as he is more comfortable at an angle but does not get orthopnea or PND. Previously had occasional swelling in his legs but none currently or recently.  In the ED, troponin elevated 132-> 183. He has been started on heparin .   Past Medical History:  Diagnosis Date   Aortic stenosis    a. 03/2022 Echo: EF 60-65%, no rwma, mild LVH, nl RV fxn, mild-mod AS.   Arthritis    Basal cell carcinoma    Coronary artery disease    a. DES to LAD and LCx April 2014 b. PTCA ostial circumflex 03/2016 due to ISR c.  9/18 PCI/DESx2 osital Lcx (ISR), p/mLCx  d. s/p CABG on 01/24/2020 with LIMA-LAD and SVG-OM.   Essential hypertension    GERD (gastroesophageal reflux disease)    Hematuria    History of blood transfusion 09/2012   History of kidney stones    Hyperlipidemia LDL goal <70    NSTEMI (non-ST elevated myocardial infarction) (HCC) 08/2012   Post-op Afib  (HCC)    a. 12/2019-01/2020 following CABG. Briefly on amio.   Type II diabetes mellitus (HCC)    Past Surgical History:  Procedure Laterality Date   BASAL CELL CARCINOMA EXCISION     right cheek; both shoulders   CARDIAC CATHETERIZATION     CATARACT EXTRACTION W/ INTRAOCULAR LENS  IMPLANT, BILATERAL Bilateral    COLONOSCOPY N/A 03/19/2016   Procedure: COLONOSCOPY;  Surgeon: Claudis RAYMOND Rivet, MD;  Location: [facility];  Service: Endoscopy;  Laterality: N/A;  9:15   CORONARY ARTERY BYPASS GRAFT N/A 01/24/2020   Procedure: CORONARY ARTERY BYPASS GRAFTING (CABG), ON PUMP, TIMES TWO, USING LEFT INTERNAL MAMMARY ARTERY AND ENDOSCOPICALLY HARVESTED RIGHT GREATER SAPHENOUS VEIN;  Surgeon: Kerrin Elspeth BROCKS, MD;  Location: [facility] OR;  Service: Open Heart Surgery;  Laterality: N/A;   CORONARY BALLOON ANGIOPLASTY N/A 04/15/2016   Procedure: Coronary Balloon Angioplasty;  Surgeon: Peter M Jordan, MD;  Location: [facility] INVASIVE CV LAB;  Service: Cardiovascular;  Laterality: N/A;   CORONARY STENT INTERVENTION N/A 10/11/2016   Procedure: CORONARY STENT INTERVENTION;  Surgeon: Dann Candyce RAMAN, MD;  Location: [facility] INVASIVE CV LAB;  Service: Cardiovascular;  Laterality: N/A;   CYSTOSCOPY N/A 12/30/2022   Procedure: CYSTOSCOPY;  Surgeon: Sherrilee Belvie CROME, MD;  Location: [facility] ORS;  Service: Urology;  Laterality: N/A;  CYSTOSCOPY W/ URETEROSCOPY W/ LITHOTRIPSY  10/2012   /notes 11/10/2012   CYSTOSCOPY WITH STENT PLACEMENT  08/2012; 09/2012   EYE SURGERY Left ~ 02/2016   for crinkled lens   INGUINAL HERNIA REPAIR Left 12/24/2020   Procedure: HERNIA REPAIR INGUINAL WITH MESH;  Surgeon: Kallie Manuelita BROCKS, MD;  Location: [facility] ORS;  Service: General;  Laterality: Left;   LEFT HEART CATH AND CORONARY ANGIOGRAPHY N/A 04/15/2016   Procedure: Left Heart Cath and Coronary Angiography;  Surgeon: Peter M Jordan, MD;  Location: [facility] INVASIVE CV LAB;  Service: Cardiovascular;  Laterality: N/A;   LEFT HEART CATH AND CORONARY  ANGIOGRAPHY N/A 10/11/2016   Procedure: LEFT HEART CATH AND CORONARY ANGIOGRAPHY;  Surgeon: Dann Candyce RAMAN, MD;  Location: [facility] INVASIVE CV LAB;  Service: Cardiovascular;  Laterality: N/A;   LEFT HEART CATH AND CORONARY ANGIOGRAPHY N/A 03/31/2018   Procedure: LEFT HEART CATH AND CORONARY ANGIOGRAPHY;  Surgeon: Burnard Debby LABOR, MD;  Location: [facility] INVASIVE CV LAB;  Service: Cardiovascular;  Laterality: N/A;   LEFT HEART CATH AND CORONARY ANGIOGRAPHY N/A 01/21/2020   Procedure: LEFT HEART CATH AND CORONARY ANGIOGRAPHY;  Surgeon: Claudene Victory ORN, MD;  Location: [facility] INVASIVE CV LAB;  Service: Cardiovascular;  Laterality: N/A;   LEFT HEART CATHETERIZATION WITH CORONARY ANGIOGRAM N/A 05/05/2012   Procedure: LEFT HEART CATHETERIZATION WITH CORONARY ANGIOGRAM;  Surgeon: Lonni JONETTA Cash, MD;  Location: [facility] CATH LAB;  Service: Cardiovascular;  Laterality: N/A;   LEFT HEART CATHETERIZATION WITH CORONARY ANGIOGRAM N/A 05/15/2012   Procedure: LEFT HEART CATHETERIZATION WITH CORONARY ANGIOGRAM;  Surgeon: Lonni JONETTA Cash, MD;  Location: [facility] CATH LAB;  Service: Cardiovascular;  Laterality: N/A;   TEE WITHOUT CARDIOVERSION N/A 01/24/2020   Procedure: TRANSESOPHAGEAL ECHOCARDIOGRAM (TEE);  Surgeon: Kerrin Elspeth BROCKS, MD;  Location: [facility] OR;  Service: Open Heart Surgery;  Laterality: N/A;   TONSILLECTOMY  1947   TRANSURETHRAL RESECTION OF PROSTATE N/A 12/30/2022   Procedure: TRANSURETHRAL RESECTION OF THE PROSTATE (TURP);  Surgeon: Sherrilee Belvie CROME, MD;  Location: [facility]S;  Service: Urology;  Laterality: N/A;     Home Medications:  Prior to Admission medications   Medication Sig Start Date End Date Taking? Authorizing Provider  acetaminophen  (TYLENOL ) 500 MG tablet Take 1,000 mg by mouth every 6 (six) hours as needed for mild pain (pain score 1-3).   Yes [provider]  aspirin  EC 81 MG tablet Take 81 mg by mouth in the morning. Swallow whole.   Yes [provider]  atorvastatin   (LIPITOR ) 80 MG tablet Take 1 tablet (80 mg total) by mouth daily. Patient taking differently: Take 80 mg by mouth at bedtime. 01/21/23  Yes   carvedilol  (COREG ) 12.5 MG tablet Take 1 tablet (12.5 mg total) by mouth 2 (two) times daily. 10/31/23  Yes Debera Jayson MATSU, MD  clopidogrel  (PLAVIX ) 75 MG tablet Take 1 tablet (75 mg total) by mouth daily. 10/31/23  Yes Debera Jayson MATSU, MD  Dulaglutide (TRULICITY) 0.75 MG/0.5ML SOAJ Inject 0.75 mg as directed once a week. Patient taking differently: Inject 0.75 mg as directed every Friday. 07/08/23  Yes   ezetimibe (ZETIA) 10 MG tablet Take 10 mg by mouth at bedtime. 10/11/23 01/09/24 Yes [provider]  fluticasone  (FLONASE ) 50 MCG/ACT nasal spray Place 1 spray into both nostrils daily as needed for allergies or rhinitis.   Yes [provider]  furosemide  (LASIX ) 40 MG tablet Take 1 tablet (40 mg total) by mouth daily. 10/12/23  Yes Debera Jayson MATSU, MD  glimepiride  (  AMARYL ) 1 MG tablet Take 1 tablet (1 mg total) by mouth daily. 01/21/23  Yes   isosorbide  mononitrate (IMDUR ) 30 MG 24 hr tablet Take 1 tablet (30 mg total) by mouth daily. 04/28/23  Yes Debera Jayson MATSU, MD  ketoconazole  (NIZORAL ) 2 % cream Apply topically to affected area twice daily. Patient taking differently: Apply 1 Application topically daily. 03/01/23  Yes   latanoprost  (XALATAN ) 0.005 % ophthalmic solution Place 1 drop into the left eye at bedtime. 10/06/21  Yes [provider]  Magnesium  400 MG TABS Take 1 tablet by mouth daily. 04/21/22  Yes Debera Jayson MATSU, MD  metFORMIN  (GLUCOPHAGE ) 1000 MG tablet Take 1 tablet (1,000 mg total) by mouth 2 (two) times daily. 11/01/23  Yes   nitroGLYCERIN  (NITROSTAT ) 0.4 MG SL tablet Place 0.4 mg under the tongue every 5 (five) minutes x 3 doses as needed for chest pain.   Yes [provider]  sacubitril -valsartan  (ENTRESTO ) 24-26 MG Take 1 tablet by mouth 2 (two) times daily. 10/31/23  Yes Debera Jayson MATSU, MD   SIMBRINZA 1-0.2 % SUSP Place 1 drop into the left eye 2 (two) times daily. 05/31/22  Yes [provider]  spironolactone  (ALDACTONE ) 25 MG tablet Take 0.5 tablets (12.5 mg total) by mouth daily. 09/15/23 12/14/23 Yes Miriam Norris, NP  tamsulosin  (FLOMAX ) 0.4 MG CAPS capsule Take 1 capsule (0.4 mg total) by mouth 2 (two) times daily. 01/21/23  Yes   clopidogrel  (PLAVIX ) 75 MG tablet Take 1 tablet (75 mg total) by mouth daily. Patient not taking: Reported on 11/18/2023 10/26/23   Debera Jayson MATSU, MD  dapagliflozin  propanediol (FARXIGA ) 10 MG TABS tablet Take 1 tablet (10 mg total) by mouth daily before breakfast. Patient not taking: Reported on 11/18/2023 05/23/23   Miriam Norris, NP  isosorbide  mononitrate (IMDUR ) 30 MG 24 hr tablet Take 1 tablet (30 mg total) by mouth daily. Patient not taking: Reported on 11/18/2023 10/12/23   Debera Jayson MATSU, MD  potassium chloride  SA (KLOR-CON  M) 20 MEQ tablet Take 1 tablet (20 mEq total) by mouth daily. Patient not taking: Reported on 11/18/2023 06/15/23   Miriam Norris, NP  sacubitril -valsartan  (ENTRESTO ) 24-26 MG Take 1 tablet by mouth 2 (two) times daily. Patient not taking: Reported on 11/18/2023 10/26/23   Debera Jayson MATSU, MD  spironolactone  (ALDACTONE ) 25 MG tablet Take 0.5 tablets (12.5 mg total) by mouth daily. Patient not taking: Reported on 11/18/2023 04/07/23   Debera Jayson MATSU, MD  spironolactone  (ALDACTONE ) 25 MG tablet Take 0.5 tablets (12.5 mg total) by mouth daily. Patient not taking: Reported on 11/18/2023 09/15/23   Miriam Norris, NP  triamcinolone cream (KENALOG) 0.1 % Apply topically to itchy skin twice daily. Patient not taking: Reported on 11/18/2023 03/01/23       Scheduled Meds:  aspirin  EC  81 mg Oral q AM   atorvastatin   80 mg Oral QHS   carvedilol   12.5 mg Oral BID   clopidogrel   75 mg Oral Daily   ezetimibe  10 mg Oral QHS   furosemide   40 mg Oral Daily   insulin  aspart  0-5 Units Subcutaneous QHS    insulin  aspart  0-9 Units Subcutaneous TID WC   isosorbide  mononitrate  30 mg Oral Daily   latanoprost   1 drop Left Eye QHS   pantoprazole   40 mg Oral BID   sacubitril -valsartan   1 tablet Oral BID   spironolactone   12.5 mg Oral Daily   tamsulosin   0.4 mg Oral BID   Continuous  Infusions:  heparin  1,150 Units/hr (11/18/23 1927)   PRN Meds: acetaminophen , nitroGLYCERIN , ondansetron  (ZOFRAN ) IV  Allergies:    Allergies  Allergen Reactions   Contrast Media [Iodinated Contrast Media] Hives and Other (See Comments)    Had welts on arm, and kidney issues after given dye in the past   Jardiance  [Empagliflozin ] Diarrhea   Penicillins Other (See Comments)    Passed out as a child     Social History:   Social History   Socioeconomic History   Marital status: Widowed    Spouse name: Not on file   Number of children: Not on file   Years of education: Not on file   Highest education level: Not on file  Occupational History   Not on file  Tobacco Use   Smoking status: Former    Current packs/day: 0.00    Average packs/day: 0.8 packs/day for 30.0 years (22.5 ttl pk-yrs)    Types: Cigarettes    Start date: 01/25/1982    Quit date: 11/05/1988    Years since quitting: 35.0   Smokeless tobacco: Never  Vaping Use   Vaping status: Never Used  Substance and Sexual Activity   Alcohol  use: Not Currently    Alcohol /week: 0.0 standard drinks of alcohol    Drug use: No   Sexual activity: Not Currently  Other Topics Concern   Not on file  Social History Narrative   Not on file   Social Drivers of Health   Financial Resource Strain: Not on file  Food Insecurity: No Food Insecurity (11/18/2023)   Hunger Vital Sign    Worried About Running Out of Food in the Last Year: Never true    Ran Out of Food in the Last Year: Never true  Transportation Needs: No Transportation Needs (11/18/2023)   PRAPARE - Administrator, Civil Service (Medical): No    Lack of Transportation  (Non-Medical): No  Physical Activity: Not on file  Stress: Not on file  Social Connections: Socially Isolated (11/18/2023)   Social Connection and Isolation Panel    Frequency of Communication with Friends and Family: More than three times a week    Frequency of Social Gatherings with Friends and Family: More than three times a week    Attends Religious Services: Never    Database Administrator or Organizations: No    Attends Banker Meetings: Never    Marital Status: Widowed  Intimate Partner Violence: Not At Risk (11/18/2023)   Humiliation, Afraid, Rape, and Kick questionnaire    Fear of Current or Ex-Partner: No    Emotionally Abused: No    Physically Abused: No    Sexually Abused: No    Family History:  Family History  Problem Relation Age of Onset   Heart disease Mother    Heart failure Mother    Heart disease Father    Heart attack Father    Colon cancer Neg Hx      ROS:  Please see the history of present illness.  All other ROS reviewed and negative.     Physical Exam/Data: Vitals:   11/18/23 2045 11/18/23 2100 11/18/23 2130 11/18/23 2244  BP: 121/79 129/89 125/82 (!) 104/92  Pulse: (!) 115 (!) 114 (!) 112 (!) 123  Resp: (!) 21 18 (!) 28 18  Temp:    98.3 F (36.8 C)  TempSrc:    Oral  SpO2: 97% 96% 96% 98%  Weight:    85 kg  Height:  5' 9 (1.753 m)   No intake or output data in the 24 hours ending 11/19/23 0203    11/18/2023   10:44 PM 11/18/2023   11:52 AM 10/12/2023   11:06 AM  Last 3 Weights  Weight (lbs) 187 lb 6.3 oz 189 lb 192 lb 9.6 oz  Weight (kg) 85 kg 85.73 kg 87.363 kg     Body mass index is 27.67 kg/m.  General:  Well nourished, well developed, in no acute distress HEENT: normal Neck: no JVD Vascular: No carotid bruits; Distal pulses 2+ bilaterally Cardiac:  normal S1, S2; RRR; loud systolic murmur Lungs:  clear to auscultation bilaterally, no wheezing, rhonchi or rales  Abd: soft, nontender, no hepatomegaly  Ext: no  edema Musculoskeletal:  No deformities, BUE and BLE strength normal and equal Skin: warm and dry  Neuro:  no focal abnormalities noted Psych:  Normal affect   EKG:  The EKG was personally reviewed and demonstrates:  sinus tachycardia, RBBB Telemetry:  Telemetry was personally reviewed and demonstrates:  sinus tachy  Relevant CV Studies: Coronary angiogram OSH 10/10/23   Ost LAD to Prox LAD lesion is 90% stenosed.    Mid LAD-2 lesion is 100% stenosed.    Ost Cx lesion is 100% stenosed.    Mid LAD-1 lesion is 70% stenosed.    Prox Cx lesion is 100% stenosed.    Mid RCA lesion is 80% stenosed, reduced to 0%.   TTE 10/10/2023 SUMMARY  The left ventricular size is normal.  Left ventricular systolic function is mildly reduced.  LV ejection fraction = 45-50%.  Unable to fully assess LV regional wall motion  The right ventricle is normal in size and function.  The left atrium is mildly to moderately dilated.  There is mild to moderate aortic stenosis.  There is mild to moderate mitral regurgitation.  There is mild tricuspid regurgitation.  There was insufficient TR detected to calculate RV systolic pressure.  There is no pericardial effusion.  There is no comparison study available.   Laboratory Data: High Sensitivity Troponin:  No results for input(s): TROPONINIHS in the last 720 hours.   Chemistry Recent Labs  Lab 11/16/23 0942 11/18/23 1201  NA 140 143  K 3.8 4.0  CL 103 103  CO2 26 26  GLUCOSE 132* 126*  BUN 17 15  CREATININE 0.93 0.98  CALCIUM  9.2 9.5  GFRNONAA >60 >60  ANIONGAP 10 13    Hematology Recent Labs  Lab 11/18/23 1201  WBC 10.4  RBC 5.01  HGB 15.6  HCT 48.1  MCV 96.0  MCH 31.1  MCHC 32.4  RDW 13.2  PLT 166   Thyroid   Recent Labs  Lab 11/18/23 1201  TSH 1.250    DDimer  Recent Labs  Lab 11/18/23 1201  DDIMER 1.07*    Radiology/Studies:  CT Angio Chest PE W and/or Wo Contrast Result Date: 11/18/2023 CLINICAL DATA:  Pulmonary  embolism (PE) suspected, low to intermediate prob, positive D-dimer EXAM: CT ANGIOGRAPHY CHEST WITH CONTRAST TECHNIQUE: Multidetector CT imaging of the chest was performed using the standard protocol during bolus administration of intravenous contrast. Multiplanar CT image reconstructions and MIPs were obtained to evaluate the vascular anatomy. RADIATION DOSE REDUCTION: This exam was performed according to the departmental dose-optimization program which includes automated exposure control, adjustment of the mA and/or kV according to patient size and/or use of iterative reconstruction technique. CONTRAST:  75mL OMNIPAQUE  IOHEXOL  350 MG/ML SOLN COMPARISON:  04/01/2022 FINDINGS: Pulmonary Embolism: No pulmonary embolism. Cardiovascular: Mild  cardiomegaly. Dense multi-vessel coronary atherosclerosis with postsurgical changes of a prior CABG. No pericardial effusion.No aortic aneurysm. Normal variant 2 vessel aortic arch anatomy. Scattered calcified atherosclerosis throughout the aorta without stenosis. Mediastinum/Nodes: No mediastinal mass.No mediastinal, hilar, or axillary lymphadenopathy. Lungs/Pleura: The midline trachea and bronchi are patent. No focal airspace consolidation, pleural effusion, or pneumothorax. Posterior bibasilar dependent atelectasis. Musculoskeletal: No acute fracture or destructive bone lesion. Sternotomy wires. Multilevel degenerative disc disease of the spine. Upper Abdomen: No acute abnormality in the partially visualized upper abdomen. Review of the MIP images confirms the above findings. IMPRESSION: No acute intrathoracic abnormality; specifically, no pulmonary embolism, pneumonia, or pleural effusion. Aortic Atherosclerosis (ICD10-I70.0). Electronically Signed   By: Rogelia Myers M.D.   On: 11/18/2023 18:19   DG Chest 2 View Result Date: 11/18/2023 EXAM: 2 VIEW(S) XRAY OF THE CHEST 11/18/2023 12:33:25 PM COMPARISON: 03/31/22 CLINICAL HISTORY: chest pain. pt states that he has a hx of  heart attack and stents and a heart cath, states that he is here today because he woke up with a racing heart beat, states that it got a little bit better when he was laying down, but got worse when he was walking around, ; denies chest pain at this time FINDINGS: LUNGS AND PLEURA: No focal pulmonary opacity. No pulmonary edema. No pleural effusion. No pneumothorax. HEART AND MEDIASTINUM: Status post CABG. Aortic arch calcification. No acute abnormality of the cardiac and mediastinal silhouettes. BONES AND SOFT TISSUES: Overlying monitor wires. Intact median sternotomy wires. Mild thoracic spondylosis. IMPRESSION: 1. No acute cardiopulmonary pathology. Electronically signed by: Waddell Calk MD 11/18/2023 12:57 PM EDT RP Workstation: [id number]   Assessment and Plan: Elevated troponin, concern for type 1 NSTEMI Chest pain  Tachycardia  Recent DES to RCA Significant CAD history  Nonspecific symptoms which are concerning for cardiac in nature. PE ruled out in the ED. No pulm edema on CT scan and no clinical signs of heart failure. He was started on heparin  which is appropriately and transferred [facility]. Denies active chest pain but remains tachycardic. Would benefit from LVEDP assessment during LHC. However, he does not look overtly volume overloaded. In the meantime, we will treat as an NSTEMI with DAPT and heparin  gtt. No STEMI on ECG. - TTE in AM - Coronary angiogram likely this admission - Trend troponin to peak  - Continue DAPT with aspirin /Plavix  - Heparin  gtt - Continue lipid lowering agents - Continue blood pressure meds with carvedilol , Entresto , spironolactone   Risk Assessment/Risk Scores:   For questions or updates, please contact [facility] Please consult [email or web address] for contact info under    Signed, Jerrell DELENA Orchard, MD  11/19/2023 2:03 AM

## 2023-11-19 NOTE — Progress Notes (Signed)
 PROGRESS NOTE    Sean Villarreal  FMW:[id number] DOB: April 11, 1940 DOA: 11/18/2023 PCP: Job Bolt, PA    83 y.o. male, with PMHx of HTN, T2DM, CAD s/p 2 DES to proximal to mid circumflex in 2018, CABG (LIMA-LAD, SVG- OM) in 2021, HFrEF, BPH, GERD, and OA , patient hospitalization at [facility] 9/15 patient underwent left heart cath that showed multivessel stenosis requiring lithotripsy and DES stent to the mid-RCA.  -Presented to ED secondary to complaints of generalized weakness, fatigue, and tachycardia, with mild chest discomfort - ED his workup significant for troponin trending up 132> 183, D-dimers were elevated at 1.07, but CTA chest negative for PE, his EKG nephric and for RBBB, LAFB with sinus tachycardia, ED discussed with cardiology, who recommended admission to [facility], and starting on heparin  drip due to concern for NSTEM  Subjective: -Feels fair, mild intermittent chest discomfort noted  Assessment and Plan:   NSTEMI Ischemic cardiomyopathy with HFrEF - Patient with recent hospitalization at [facility] 9/15 patient underwent left heart cath that showed multivessel stenosis requiring lithotripsy and DES stent to the mid-RCA.  -Troponin trending up to 1069 this a.m. -Cards following, continue heparin  GTT, aspirin , Plavix , Coreg , statin and Zetia - Clinically appears euvolemic, follow-up repeat echo, blood pressure is soft, hold Imdur  and Entresto   Hypertension - Meds as above, holding Entresto  and Imdur  today   Diabetes mellitus type 2 - Check A1c - Hold home Trulicity and Amaryl  - Will keep on insulin  sliding scale   BPH - Continue with Flomax    GERD - Continue with PPI  DVT prophylaxis: IV heparin  Code Status: Full code Family Communication: None present Disposition Plan: Home pending above workup  Consultants:    Procedures:   Antimicrobials:    Objective: Vitals:   11/19/23 0412 11/19/23 0800 11/19/23 0907 11/19/23 1136  BP:  114/72 116/73 99/68 99/63   Pulse: 91 84 84 80  Resp: 18 17  17   Temp: 97.8 F (36.6 C) 97.8 F (36.6 C)    TempSrc: Oral Oral    SpO2:  97%  98%  Weight: 85 kg     Height: 5' 9 (1.753 m)       Intake/Output Summary (Last 24 hours) at 11/19/2023 1311 Last data filed at 11/19/2023 0917 Gross per 24 hour  Intake 531.94 ml  Output 1925 ml  Net -1393.06 ml   Filed Weights   11/18/23 1152 11/18/23 2244 11/19/23 0412  Weight: 85.7 kg 85 kg 85 kg    Examination:  General exam: Appears calm and comfortable  Respiratory system: Clear to auscultation Cardiovascular system: S1 & S2 heard, RRR.  Abd: nondistended, soft and nontender.Normal bowel sounds heard. Central nervous system: Alert and oriented. No focal neurological deficits. Extremities: no edema Skin: No rashes Psychiatry:  Mood & affect appropriate.     Data Reviewed:   CBC: Recent Labs  Lab 11/18/23 1201 11/19/23 0317  WBC 10.4 12.0*  HGB 15.6 15.1  HCT 48.1 45.1  MCV 96.0 93.4  PLT 166 184   Basic Metabolic Panel: Recent Labs  Lab 11/16/23 0942 11/18/23 1201  NA 140 143  K 3.8 4.0  CL 103 103  CO2 26 26  GLUCOSE 132* 126*  BUN 17 15  CREATININE 0.93 0.98  CALCIUM  9.2 9.5   GFR: Estimated Creatinine Clearance: 62.8 mL/min (by C-G formula based on SCr of 0.98 mg/dL). Liver Function Tests: No results for input(s): AST, ALT, ALKPHOS, BILITOT, PROT, ALBUMIN  in the last 168 hours. No results  for input(s): LIPASE, AMYLASE in the last 168 hours. No results for input(s): AMMONIA in the last 168 hours. Coagulation Profile: Recent Labs  Lab 11/18/23 1923  INR 1.0   Cardiac Enzymes: No results for input(s): CKTOTAL, CKMB, CKMBINDEX, TROPONINI in the last 168 hours. BNP (last 3 results) No results for input(s): PROBNP in the last 8760 hours. HbA1C: No results for input(s): HGBA1C in the last 72 hours. CBG: Recent Labs  Lab 11/18/23 2310 11/19/23 0640  11/19/23 1134  GLUCAP 265* 138* 202*   Lipid Profile: No results for input(s): CHOL, HDL, LDLCALC, TRIG, CHOLHDL, LDLDIRECT in the last 72 hours. Thyroid  Function Tests: Recent Labs    11/18/23 1201  TSH 1.250   Anemia Panel: No results for input(s): VITAMINB12, FOLATE, FERRITIN, TIBC, IRON, RETICCTPCT in the last 72 hours. Urine analysis:    Component Value Date/Time   COLORURINE YELLOW 07/30/2022 1018   APPEARANCEUR Clear 07/22/2023 0938   LABSPEC 1.021 07/30/2022 1018   PHURINE 6.0 07/30/2022 1018   GLUCOSEU 3+ (A) 07/22/2023 0938   HGBUR NEGATIVE 07/30/2022 1018   BILIRUBINUR Negative 07/22/2023 0938   KETONESUR NEGATIVE 07/30/2022 1018   PROTEINUR Negative 07/22/2023 0938   PROTEINUR NEGATIVE 07/30/2022 1018   NITRITE Negative 07/22/2023 0938   NITRITE NEGATIVE 07/30/2022 1018   LEUKOCYTESUR Negative 07/22/2023 0938   LEUKOCYTESUR NEGATIVE 07/30/2022 1018   Sepsis Labs: @LABRCNTIP (procalcitonin:4,lacticidven:4)  )No results found for this or any previous visit (from the past 240 hours).   Radiology Studies: CT Angio Chest PE W and/or Wo Contrast Result Date: 11/18/2023 CLINICAL DATA:  Pulmonary embolism (PE) suspected, low to intermediate prob, positive D-dimer EXAM: CT ANGIOGRAPHY CHEST WITH CONTRAST TECHNIQUE: Multidetector CT imaging of the chest was performed using the standard protocol during bolus administration of intravenous contrast. Multiplanar CT image reconstructions and MIPs were obtained to evaluate the vascular anatomy. RADIATION DOSE REDUCTION: This exam was performed according to the departmental dose-optimization program which includes automated exposure control, adjustment of the mA and/or kV according to patient size and/or use of iterative reconstruction technique. CONTRAST:  75mL OMNIPAQUE  IOHEXOL  350 MG/ML SOLN COMPARISON:  04/01/2022 FINDINGS: Pulmonary Embolism: No pulmonary embolism. Cardiovascular: Mild cardiomegaly.  Dense multi-vessel coronary atherosclerosis with postsurgical changes of a prior CABG. No pericardial effusion.No aortic aneurysm. Normal variant 2 vessel aortic arch anatomy. Scattered calcified atherosclerosis throughout the aorta without stenosis. Mediastinum/Nodes: No mediastinal mass.No mediastinal, hilar, or axillary lymphadenopathy. Lungs/Pleura: The midline trachea and bronchi are patent. No focal airspace consolidation, pleural effusion, or pneumothorax. Posterior bibasilar dependent atelectasis. Musculoskeletal: No acute fracture or destructive bone lesion. Sternotomy wires. Multilevel degenerative disc disease of the spine. Upper Abdomen: No acute abnormality in the partially visualized upper abdomen. Review of the MIP images confirms the above findings. IMPRESSION: No acute intrathoracic abnormality; specifically, no pulmonary embolism, pneumonia, or pleural effusion. Aortic Atherosclerosis (ICD10-I70.0). Electronically Signed   By: Rogelia Myers M.D.   On: 11/18/2023 18:19   DG Chest 2 View Result Date: 11/18/2023 EXAM: 2 VIEW(S) XRAY OF THE CHEST 11/18/2023 12:33:25 PM COMPARISON: 03/31/22 CLINICAL HISTORY: chest pain. pt states that he has a hx of heart attack and stents and a heart cath, states that he is here today because he woke up with a racing heart beat, states that it got a little bit better when he was laying down, but got worse when he was walking around, ; denies chest pain at this time FINDINGS: LUNGS AND PLEURA: No focal pulmonary opacity. No pulmonary edema. No pleural effusion. No  pneumothorax. HEART AND MEDIASTINUM: Status post CABG. Aortic arch calcification. No acute abnormality of the cardiac and mediastinal silhouettes. BONES AND SOFT TISSUES: Overlying monitor wires. Intact median sternotomy wires. Mild thoracic spondylosis. IMPRESSION: 1. No acute cardiopulmonary pathology. Electronically signed by: Waddell Calk MD 11/18/2023 12:57 PM EDT RP Workstation: [id number]      Scheduled Meds:  aspirin  EC  81 mg Oral q AM   atorvastatin   80 mg Oral QHS   brinzolamide   1 drop Left Eye BID   And   brimonidine  1 drop Left Eye BID   carvedilol   12.5 mg Oral BID   clopidogrel   75 mg Oral Daily   ezetimibe  10 mg Oral QHS   furosemide   40 mg Oral Daily   insulin  aspart  0-5 Units Subcutaneous QHS   insulin  aspart  0-9 Units Subcutaneous TID WC   isosorbide  mononitrate  30 mg Oral Daily   latanoprost   1 drop Left Eye QHS   pantoprazole   40 mg Oral BID   sacubitril -valsartan   1 tablet Oral BID   spironolactone   12.5 mg Oral Daily   tamsulosin   0.4 mg Oral BID   Continuous Infusions:  heparin  1,300 Units/hr (11/19/23 1309)     LOS: 1 day    Time spent:    Sigurd Pac, MD [facility]   11/19/2023, 1:11 PM

## 2023-11-19 NOTE — Progress Notes (Signed)
 PHARMACY - ANTICOAGULATION CONSULT NOTE  Pharmacy Consult for heparin  Indication: NSTEMI  Labs: Recent Labs    11/16/23 0942 11/18/23 1201 11/18/23 1923 11/19/23 0317  HGB  --  15.6  --  15.1  HCT  --  48.1  --  45.1  PLT  --  166  --  184  APTT  --   --  24  --   LABPROT  --   --  13.9  --   INR  --   --  1.0  --   HEPARINUNFRC  --   --   --  0.29*  CREATININE 0.93 0.98  --   --    Assessment: 83yo male subtherapeutic on heparin  with initial dosing for NSTEMI; no infusion issues or signs of bleeding per RN.  Goal of Therapy:  Heparin  level 0.3-0.7 units/ml   Plan:  Increase heparin  infusion by 1-2 units/kg/hr to 1300 units/hr. Check level in 6-8 hours.   Marvetta Dauphin, PharmD, BCPS 11/19/2023 4:02 AM

## 2023-11-19 NOTE — Progress Notes (Addendum)
 Patient ambulated to restroom in room, stand-by assist from this RN. Pt HR sustained 110's, mainly 113-117. HR 117 bpm was highest during this ambulation. Pt denies chest pain, shortness of breath, or palpitations during this ambulation. HR decreased to 106 upon patient ambulation back to bed, back to 90's once seated/resting for a minute.

## 2023-11-19 NOTE — Progress Notes (Signed)
 PHARMACY - ANTICOAGULATION CONSULT NOTE  Pharmacy Consult for heparin  Indication: NSTEMI  Labs: Recent Labs    11/18/23 1201 11/18/23 1923 11/19/23 0317 11/19/23 0900 11/19/23 1104  HGB 15.6  --  15.1  --   --   HCT 48.1  --  45.1  --   --   PLT 166  --  184  --   --   APTT  --  24  --   --   --   LABPROT  --  13.9  --   --   --   INR  --  1.0  --   --   --   HEPARINUNFRC  --   --  0.29*  --  0.35  CREATININE 0.98  --   --   --   --   TROPONINIHS  --   --   --  960*  --    Assessment: 83yo male subtherapeutic on heparin  with initial dosing for NSTEMI.  10/25: HL therapeutic at 0.35 on 1300 units/hr. No issues with heparin  or s/sx of bleeding per nurse. CBC stable.   Goal of Therapy:  Heparin  level 0.3-0.7 units/ml   Plan:  Continue heparin  rate of 1300 units/hr Check confirmatory level in 8 hours Daily anti-Xa and CBC  R. Samual Satterfield, PharmD PGY-1 Acute Care Pharmacy Resident [facility] Please refer to [facility] for [facility] Pharmacy numbers 11/19/2023 11:50 AM

## 2023-11-19 NOTE — Progress Notes (Signed)
 Patient ambulated in room, stand-by assist from this RN. HR sustained 105/106,

## 2023-11-19 NOTE — Progress Notes (Signed)
  Echocardiogram 2D Echocardiogram has been performed.  Sean Villarreal 11/19/2023, 4:07 PM

## 2023-11-20 DIAGNOSIS — I214 Non-ST elevation (NSTEMI) myocardial infarction: Secondary | ICD-10-CM | POA: Diagnosis not present

## 2023-11-20 LAB — BASIC METABOLIC PANEL WITH GFR
Anion gap: 13 (ref 5–15)
BUN: 21 mg/dL (ref 8–23)
CO2: 25 mmol/L (ref 22–32)
Calcium: 8.6 mg/dL — ABNORMAL LOW (ref 8.9–10.3)
Chloride: 101 mmol/L (ref 98–111)
Creatinine, Ser: 0.97 mg/dL (ref 0.61–1.24)
GFR, Estimated: >60 mL/min (ref >60)
Glucose, Bld: 113 mg/dL — ABNORMAL HIGH (ref 70–99)
Potassium: 3.2 mmol/L — ABNORMAL LOW (ref 3.5–5.1)
Sodium: 139 mmol/L (ref 135–145)

## 2023-11-20 LAB — CBC
HCT: 44.8 % (ref 39.0–52.0)
Hemoglobin: 14.6 g/dL (ref 13.0–17.0)
MCH: 30.8 pg (ref 26.0–34.0)
MCHC: 32.6 g/dL (ref 30.0–36.0)
MCV: 94.5 fL (ref 80.0–100.0)
Platelets: 138 K/uL — ABNORMAL LOW (ref 150–400)
RBC: 4.74 MIL/uL (ref 4.22–5.81)
RDW: 13.2 % (ref 11.5–15.5)
WBC: 9.2 K/uL (ref 4.0–10.5)
nRBC: 0 % (ref 0.0–0.2)

## 2023-11-20 LAB — HEPARIN LEVEL (UNFRACTIONATED)
Heparin Unfractionated: 0.73 [IU]/mL — ABNORMAL HIGH (ref 0.30–0.70)
Heparin Unfractionated: 0.41 [IU]/mL (ref 0.30–0.70)

## 2023-11-20 LAB — GLUCOSE, CAPILLARY
Glucose-Capillary: 128 mg/dL — ABNORMAL HIGH (ref 70–99)
Glucose-Capillary: 166 mg/dL — ABNORMAL HIGH (ref 70–99)
Glucose-Capillary: 153 mg/dL — ABNORMAL HIGH (ref 70–99)
Glucose-Capillary: 148 mg/dL — ABNORMAL HIGH (ref 70–99)

## 2023-11-20 MED ORDER — PREDNISONE 20 MG PO TABS
50.0000 mg | ORAL_TABLET | Freq: Four times a day (QID) | ORAL | Status: AC
  Administered 2023-11-21 (×3): 50 mg via ORAL
  Filled 2023-11-20 (×3): qty 1

## 2023-11-20 MED ORDER — FREE WATER
500.0000 mL | Freq: Once | Status: AC
  Administered 2023-11-21: 500 mL via ORAL

## 2023-11-20 NOTE — Progress Notes (Signed)
 Progress Note  Patient Name: Sean Villarreal Date of Encounter: 11/20/2023  Primary Cardiologist: Jayson Sierras, MD   Subjective   Patient seen and examined at his bedside. He was sitting up in bed when I arrived.   Inpatient Medications    Scheduled Meds:  aspirin  EC  81 mg Oral q AM   atorvastatin   80 mg Oral QHS   brinzolamide   1 drop Left Eye BID   And   brimonidine  1 drop Left Eye BID   carvedilol   12.5 mg Oral BID   clopidogrel   75 mg Oral Daily   ezetimibe  10 mg Oral QHS   furosemide   40 mg Oral Daily   insulin  aspart  0-5 Units Subcutaneous QHS   insulin  aspart  0-9 Units Subcutaneous TID WC   latanoprost   1 drop Left Eye QHS   pantoprazole   40 mg Oral BID   spironolactone   12.5 mg Oral Daily   tamsulosin   0.4 mg Oral BID   Continuous Infusions:  heparin  1,300 Units/hr (11/20/23 0858)   PRN Meds: acetaminophen , nitroGLYCERIN , ondansetron  (ZOFRAN ) IV   Vital Signs    Vitals:   11/19/23 2020 11/19/23 2345 11/20/23 0400 11/20/23 0730  BP: 106/71 98/63 106/68 108/66  Pulse: 77 77 72 80  Resp: 17 17 18 18   Temp: 97.8 F (36.6 C) (!) 97.5 F (36.4 C) (!) 97.5 F (36.4 C) 97.9 F (36.6 C)  TempSrc: Oral Oral Oral Oral  SpO2: 100% 98% 98% 98%  Weight:      Height:        Intake/Output Summary (Last 24 hours) at 11/20/2023 0912 Last data filed at 11/20/2023 0700 Gross per 24 hour  Intake 1213.57 ml  Output 1970 ml  Net -756.43 ml   Filed Weights   11/18/23 1152 11/18/23 2244 11/19/23 0412  Weight: 85.7 kg 85 kg 85 kg    Telemetry    - Personally Reviewed  ECG     - Personally Reviewed  Physical Exam    General: Comfortable Head: Atraumatic, normal size  Eyes: PEERLA, EOMI  Neck: Supple, normal JVD Cardiac: Normal S1, S2; RRR; no murmurs, rubs, or gallops Lungs: Clear to auscultation bilaterally Abd: Soft, nontender, no hepatomegaly  Ext: warm, no edema Musculoskeletal: No deformities, BUE and BLE strength normal and  equal Skin: Warm and dry, no rashes   Neuro: Alert and oriented to person, place, time, and situation, CNII-XII grossly intact, no focal deficits  Psych: Normal mood and affect   Labs    Chemistry Recent Labs  Lab 11/16/23 0942 11/18/23 1201 11/20/23 0602  NA 140 143 139  K 3.8 4.0 3.2*  CL 103 103 101  CO2 26 26 25   GLUCOSE 132* 126* 113*  BUN 17 15 21   CREATININE 0.93 0.98 0.97  CALCIUM  9.2 9.5 8.6*  GFRNONAA >60 >60 >60  ANIONGAP 10 13 13      Hematology Recent Labs  Lab 11/18/23 1201 11/19/23 0317 11/20/23 0204  WBC 10.4 12.0* 9.2  RBC 5.01 4.83 4.74  HGB 15.6 15.1 14.6  HCT 48.1 45.1 44.8  MCV 96.0 93.4 94.5  MCH 31.1 31.3 30.8  MCHC 32.4 33.5 32.6  RDW 13.2 13.2 13.2  PLT 166 184 138*    Cardiac EnzymesNo results for input(s): TROPONINI in the last 168 hours. No results for input(s): TROPIPOC in the last 168 hours.   BNPNo results for input(s): BNP, PROBNP in the last 168 hours.   DDimer  Recent Labs  Lab 11/18/23 1201  DDIMER 1.07*     Radiology    ECHOCARDIOGRAM COMPLETE Result Date: 11/19/2023    ECHOCARDIOGRAM REPORT   Patient Name:   Sean Villarreal Date of Exam: 11/19/2023 Medical Rec #:  [id number]         Height:       69.0 in Accession #:    [id number]        Weight:       187.4 lb Date of Birth:  1941/01/02          BSA:          2.010 m Patient Age:    83 years          BP:           114/72 mmHg Patient Gender: M                 HR:           78 bpm. Exam Location:  Inpatient Procedure: 2D Echo (Both Spectral and Color Flow Doppler were utilized during            procedure). Indications:    NSTEMI  History:        Patient has prior history of Echocardiogram examinations, most                 recent 04/10/2023. CAD; Risk Factors:Hypertension, Dyslipidemia                 and Diabetes.  Sonographer:    Tinnie Barefoot RDCS Referring Phys: [id number] DAWOOD S ELGERGAWY IMPRESSIONS  1. Left ventricular ejection fraction, by estimation, is 50 to  55%. The left ventricle has low normal function. The left ventricle demonstrates global hypokinesis. There is mild concentric left ventricular hypertrophy. Left ventricular diastolic parameters are consistent with Grade I diastolic dysfunction (impaired relaxation).  2. Right ventricular systolic function is normal. The right ventricular size is normal. There is normal pulmonary artery systolic pressure.  3. The mitral valve is degenerative. Mild mitral valve regurgitation. No evidence of mitral stenosis. Moderate mitral annular calcification.  4. The aortic valve is calcified. There is mild calcification of the aortic valve. There is mild thickening of the aortic valve. Aortic valve regurgitation is not visualized. Moderate to severe aortic valve stenosis. Aortic valve area, by VTI measures 0.86 cm. Aortic valve mean gradient measures 18.0 mmHg. Aortic valve Vmax measures 2.74 m/s.  5. The inferior vena cava is normal in size with greater than 50% respiratory variability, suggesting right atrial pressure of 3 mmHg. FINDINGS  Left Ventricle: Left ventricular ejection fraction, by estimation, is 50 to 55%. The left ventricle has low normal function. The left ventricle demonstrates global hypokinesis. The left ventricular internal cavity size was normal in size. There is mild concentric left ventricular hypertrophy. Left ventricular diastolic parameters are consistent with Grade I diastolic dysfunction (impaired relaxation). Right Ventricle: The right ventricular size is normal. No increase in right ventricular wall thickness. Right ventricular systolic function is normal. There is normal pulmonary artery systolic pressure. The tricuspid regurgitant velocity is 2.20 m/s, and  with an assumed right atrial pressure of 3 mmHg, the estimated right ventricular systolic pressure is 22.4 mmHg. Left Atrium: Left atrial size was normal in size. Right Atrium: Right atrial size was normal in size. Pericardium: There is no  evidence of pericardial effusion. Mitral Valve: The mitral valve is degenerative in appearance. Moderate mitral annular calcification. Mild mitral valve regurgitation. No evidence of mitral valve  stenosis. MV peak gradient, 5.4 mmHg. The mean mitral valve gradient is 3.0 mmHg. Tricuspid Valve: The tricuspid valve is normal in structure. Tricuspid valve regurgitation is not demonstrated. No evidence of tricuspid stenosis. Aortic Valve: The aortic valve is calcified. There is mild calcification of the aortic valve. There is mild thickening of the aortic valve. Aortic valve regurgitation is not visualized. Moderate to severe aortic stenosis is present. Aortic valve mean gradient measures 18.0 mmHg. Aortic valve peak gradient measures 30.0 mmHg. Aortic valve area, by VTI measures 0.86 cm. Pulmonic Valve: The pulmonic valve was normal in structure. Pulmonic valve regurgitation is mild. No evidence of pulmonic stenosis. Aorta: The aortic root is normal in size and structure. Venous: The inferior vena cava is normal in size with greater than 50% respiratory variability, suggesting right atrial pressure of 3 mmHg. IAS/Shunts: No atrial level shunt detected by color flow Doppler.  LEFT VENTRICLE PLAX 2D LVIDd:         5.20 cm     Diastology LVIDs:         4.00 cm     LV e' medial:    4.46 cm/s LV PW:         1.00 cm     LV E/e' medial:  18.5 LV IVS:        1.00 cm     LV e' lateral:   7.07 cm/s LVOT diam:     2.00 cm     LV E/e' lateral: 11.7 LV SV:         52 LV SV Index:   26 LVOT Area:     3.14 cm  LV Volumes (MOD) LV vol d, MOD A4C: 83.0 ml LV vol s, MOD A4C: 53.8 ml LV SV MOD A4C:     83.0 ml RIGHT VENTRICLE            IVC RV Basal diam:  3.10 cm    IVC diam: 2.00 cm RV S prime:     6.96 cm/s TAPSE (M-mode): 1.0 cm LEFT ATRIUM             Index        RIGHT ATRIUM           Index LA diam:        4.70 cm 2.34 cm/m   RA Area:     14.20 cm LA Vol (A2C):   56.9 ml 28.31 ml/m  RA Volume:   29.90 ml  14.88 ml/m LA Vol  (A4C):   65.6 ml 32.64 ml/m LA Biplane Vol: 61.7 ml 30.70 ml/m  AORTIC VALVE AV Area (Vmax):    0.90 cm AV Area (Vmean):   0.86 cm AV Area (VTI):     0.86 cm AV Vmax:           274.00 cm/s AV Vmean:          198.000 cm/s AV VTI:            0.602 m AV Peak Grad:      30.0 mmHg AV Mean Grad:      18.0 mmHg LVOT Vmax:         78.50 cm/s LVOT Vmean:        54.450 cm/s LVOT VTI:          0.165 m LVOT/AV VTI ratio: 0.27  AORTA Ao Root diam: 3.30 cm Ao Asc diam:  3.30 cm MITRAL VALVE  TRICUSPID VALVE MV Area (PHT): 3.31 cm     TR Peak grad:   19.4 mmHg MV Area VTI:   1.72 cm     TR Vmax:        220.00 cm/s MV Peak grad:  5.4 mmHg MV Mean grad:  3.0 mmHg     SHUNTS MV Vmax:       1.16 m/s     Systemic VTI:  0.16 m MV Vmean:      80.3 cm/s    Systemic Diam: 2.00 cm MV Decel Time: 229 msec MV E velocity: 82.50 cm/s MV A velocity: 118.00 cm/s MV E/A ratio:  0.70 Taleen Prosser DO Electronically signed by Dub Huntsman DO Signature Date/Time: 11/19/2023/4:31:03 PM    Final    CT Angio Chest PE W and/or Wo Contrast Result Date: 11/18/2023 CLINICAL DATA:  Pulmonary embolism (PE) suspected, low to intermediate prob, positive D-dimer EXAM: CT ANGIOGRAPHY CHEST WITH CONTRAST TECHNIQUE: Multidetector CT imaging of the chest was performed using the standard protocol during bolus administration of intravenous contrast. Multiplanar CT image reconstructions and MIPs were obtained to evaluate the vascular anatomy. RADIATION DOSE REDUCTION: This exam was performed according to the departmental dose-optimization program which includes automated exposure control, adjustment of the mA and/or kV according to patient size and/or use of iterative reconstruction technique. CONTRAST:  75mL OMNIPAQUE  IOHEXOL  350 MG/ML SOLN COMPARISON:  04/01/2022 FINDINGS: Pulmonary Embolism: No pulmonary embolism. Cardiovascular: Mild cardiomegaly. Dense multi-vessel coronary atherosclerosis with postsurgical changes of a prior CABG. No  pericardial effusion.No aortic aneurysm. Normal variant 2 vessel aortic arch anatomy. Scattered calcified atherosclerosis throughout the aorta without stenosis. Mediastinum/Nodes: No mediastinal mass.No mediastinal, hilar, or axillary lymphadenopathy. Lungs/Pleura: The midline trachea and bronchi are patent. No focal airspace consolidation, pleural effusion, or pneumothorax. Posterior bibasilar dependent atelectasis. Musculoskeletal: No acute fracture or destructive bone lesion. Sternotomy wires. Multilevel degenerative disc disease of the spine. Upper Abdomen: No acute abnormality in the partially visualized upper abdomen. Review of the MIP images confirms the above findings. IMPRESSION: No acute intrathoracic abnormality; specifically, no pulmonary embolism, pneumonia, or pleural effusion. Aortic Atherosclerosis (ICD10-I70.0). Electronically Signed   By: Rogelia Myers M.D.   On: 11/18/2023 18:19   DG Chest 2 View Result Date: 11/18/2023 EXAM: 2 VIEW(S) XRAY OF THE CHEST 11/18/2023 12:33:25 PM COMPARISON: 03/31/22 CLINICAL HISTORY: chest pain. pt states that he has a hx of heart attack and stents and a heart cath, states that he is here today because he woke up with a racing heart beat, states that it got a little bit better when he was laying down, but got worse when he was walking around, ; denies chest pain at this time FINDINGS: LUNGS AND PLEURA: No focal pulmonary opacity. No pulmonary edema. No pleural effusion. No pneumothorax. HEART AND MEDIASTINUM: Status post CABG. Aortic arch calcification. No acute abnormality of the cardiac and mediastinal silhouettes. BONES AND SOFT TISSUES: Overlying monitor wires. Intact median sternotomy wires. Mild thoracic spondylosis. IMPRESSION: 1. No acute cardiopulmonary pathology. Electronically signed by: Waddell Calk MD 11/18/2023 12:57 PM EDT RP Workstation: [id number]    Cardiac Studies   Echo   Patient Profile     83 y.o. male with a hx of ICM with HFrEF,  MI in 2014 with PCI, CAD with PCI Lcx in 2018 and 2v CABG in 2021 (LIMA-LAD, SVG-OM) htn, DM, BPH, GERD, OA.  Assessment & Plan    CAD s/p CABG, presenting with chest pain  NSTEMI  Aortic Valve stenosis Hyperlipidemia  Currently  on heparin  drip and denies any symptoms.  I do think he would benefit from a left heart catheterization, hopefully at this time we can go across his aortic valve to get more information on his valve gradients as well.  We discussed about his echo results and his current medical state.  He is in agreement with moving forward with heart catheterization.  Hyperlipidemia - continue with current statin medication.  Outpatient lipid profile done on September 14 LDL 59.  He is currently on atorvastatin  80 mg daily and Zetia 10 mg daily.  LP(a) elevated as well.  Could benefit from PCSK9 inhibitors given his harboring the ASCVD and still not less than 55 but this can be done in the outpatient setting.  Informed Consent   Shared Decision Making/Informed Consent The risks [stroke (1 in 1000), death (1 in 1000), kidney failure [usually temporary] (1 in 500), bleeding (1 in 200), allergic reaction [possibly serious] (1 in 200)], benefits (diagnostic support and management of coronary artery disease) and alternatives of a cardiac catheterization were discussed in detail with Mr. Vanderzee and he is willing to proceed.       For questions or updates, please contact [facility] Please consult [email or web address] for contact info under Cardiology/STEMI.      Signed, Shawnta Schlegel, DO  11/20/2023, 9:12 AM

## 2023-11-20 NOTE — Progress Notes (Signed)
 PHARMACY - ANTICOAGULATION CONSULT NOTE  Pharmacy Consult for heparin  Indication: NSTEMI  HEPARIN  DW (KG): 85  Labs: Recent Labs    11/18/23 1201 11/18/23 1201 11/18/23 1923 11/19/23 0317 11/19/23 0900 11/19/23 1104 11/19/23 2028 11/20/23 0204 11/20/23 0602 11/20/23 0847  HGB 15.6  --   --  15.1  --   --   --  14.6  --   --   HCT 48.1  --   --  45.1  --   --   --  44.8  --   --   PLT 166  --   --  184  --   --   --  138*  --   --   APTT  --   --  24  --   --   --   --   --   --   --   LABPROT  --   --  13.9  --   --   --   --   --   --   --   INR  --   --  1.0  --   --   --   --   --   --   --   HEPARINUNFRC  --    < >  --  0.29*  --  0.35 0.37  --   --  0.73*  CREATININE 0.98  --   --   --   --   --   --   --  0.97  --   TROPONINIHS  --   --   --   --  960* 1,069*  --   --   --   --    < > = values in this interval not displayed.   Assessment: 83yo male on heparin  with initial dosing for NSTEMI.  10/26: Heparin  level slightly supratherapeutic, 0.73, on heparin  after 2 therapeutic levels at 1300 units/hr. No issues with heparin  or s/sx of bleeding per nurse. CBC stable  Goal of Therapy:  Heparin  level 0.3-0.7 units/ml Monitor platelets by anticoagulation protocol: Yes    Plan:  Decrease heparin  rate to 1200 units/hr Recheck anti-Xa in 8 hours Daily anti-Xa and CBC  R. Samual Satterfield, PharmD PGY-1 Acute Care Pharmacy Resident [facility] Please refer to [facility] for [facility] Pharmacy numbers 11/20/2023 10:01 AM

## 2023-11-20 NOTE — Progress Notes (Signed)
 PROGRESS NOTE    Sean Villarreal  FMW:[id number] DOB: [birth year] DOA: 11/18/2023 PCP: Sean Bolt, PA    83 y.o. male, with PMHx of HTN, T2DM, CAD s/p 2 DES to proximal to mid circumflex in 2018, CABG (LIMA-LAD, SVG- OM) in 2021, HFrEF, BPH, GERD, and OA , patient hospitalization at [facility] 9/15 patient underwent left heart cath that showed multivessel stenosis requiring lithotripsy and DES stent to the mid-RCA.  -Presented to ED secondary to complaints of generalized weakness, fatigue, and tachycardia, with mild chest discomfort - ED his workup significant for troponin trending up 132> 183, D-dimers were elevated at 1.07, but CTA chest negative for PE, his EKG nephric and for RBBB, LAFB with sinus tachycardia, ED discussed with cardiology, who recommended admission to [facility], and starting on heparin  drip due to concern for NSTEMI, cards following  Subjective: -Feels fair, mild intermittent chest discomfort overnight as well, denies any dyspnea  Assessment and Plan:   NSTEMI Ischemic cardiomyopathy with HFrEF - Patient with recent hospitalization at [facility] 9/15 patient underwent left heart cath that showed multivessel stenosis requiring lithotripsy and DES stent to the mid-RCA.  -Troponin trending up to 1069 yesterday, continues to have mild intermittent chest discomfort -Cards following, continue heparin  GTT, aspirin , Plavix , Coreg , statin and Zetia - Clinically appears euvolemic, echo with EF 50-55%, grade 1 DD, normal RV  Hypertension - Meds as above, holding Entresto  and Imdur  today   Diabetes mellitus type 2 - Check A1c - Hold home Trulicity and Amaryl  - Will keep on insulin  sliding scale   BPH - Continue with Flomax    GERD - Continue with PPI  DVT prophylaxis: IV heparin  Code Status: Full code Family Communication: None present Disposition Plan: Home pending above workup  Consultants:    Procedures:   Antimicrobials:     Objective: Vitals:   11/19/23 2020 11/19/23 2345 11/20/23 0400 11/20/23 0730  BP: 106/71 98/63 106/68 108/66  Pulse: 77 77 72 80  Resp: 17 17 18 18   Temp: 97.8 F (36.6 C) (!) 97.5 F (36.4 C) (!) 97.5 F (36.4 C) 97.9 F (36.6 C)  TempSrc: Oral Oral Oral Oral  SpO2: 100% 98% 98% 98%  Weight:      Height:        Intake/Output Summary (Last 24 hours) at 11/20/2023 1101 Last data filed at 11/20/2023 0700 Gross per 24 hour  Intake 1213.57 ml  Output 1795 ml  Net -581.43 ml   Filed Weights   11/18/23 1152 11/18/23 2244 11/19/23 0412  Weight: 85.7 kg 85 kg 85 kg    Examination:  General exam: Appears calm and comfortable, no distress Respiratory system: Clear to auscultation Cardiovascular system: S1 & S2 heard, RRR.  Abd: nondistended, soft and nontender.Normal bowel sounds heard. Central nervous system: Alert and oriented. No focal neurological deficits. Extremities: no edema Skin: No rashes Psychiatry:  Mood & affect appropriate.     Data Reviewed:   CBC: Recent Labs  Lab 11/18/23 1201 11/19/23 0317 11/20/23 0204  WBC 10.4 12.0* 9.2  HGB 15.6 15.1 14.6  HCT 48.1 45.1 44.8  MCV 96.0 93.4 94.5  PLT 166 184 138*   Basic Metabolic Panel: Recent Labs  Lab 11/16/23 0942 11/18/23 1201 11/20/23 0602  NA 140 143 139  K 3.8 4.0 3.2*  CL 103 103 101  CO2 26 26 25   GLUCOSE 132* 126* 113*  BUN 17 15 21   CREATININE 0.93 0.98 0.97  CALCIUM  9.2 9.5 8.6*   GFR: Estimated  Creatinine Clearance: 63.4 mL/min (by C-G formula based on SCr of 0.97 mg/dL). Liver Function Tests: No results for input(s): AST, ALT, ALKPHOS, BILITOT, PROT, ALBUMIN  in the last 168 hours. No results for input(s): LIPASE, AMYLASE in the last 168 hours. No results for input(s): AMMONIA in the last 168 hours. Coagulation Profile: Recent Labs  Lab 11/18/23 1923  INR 1.0   Cardiac Enzymes: No results for input(s): CKTOTAL, CKMB, CKMBINDEX, TROPONINI in the  last 168 hours. BNP (last 3 results) No results for input(s): PROBNP in the last 8760 hours. HbA1C: No results for input(s): HGBA1C in the last 72 hours. CBG: Recent Labs  Lab 11/19/23 0640 11/19/23 1134 11/19/23 1628 11/19/23 2121 11/20/23 0627  GLUCAP 138* 202* 169* 153* 128*   Lipid Profile: No results for input(s): CHOL, HDL, LDLCALC, TRIG, CHOLHDL, LDLDIRECT in the last 72 hours. Thyroid  Function Tests: Recent Labs    11/18/23 1201  TSH 1.250   Anemia Panel: No results for input(s): VITAMINB12, FOLATE, FERRITIN, TIBC, IRON, RETICCTPCT in the last 72 hours. Urine analysis:    Component Value Date/Time   COLORURINE YELLOW 07/30/2022 1018   APPEARANCEUR Clear 07/22/2023 0938   LABSPEC 1.021 07/30/2022 1018   PHURINE 6.0 07/30/2022 1018   GLUCOSEU 3+ (A) 07/22/2023 0938   HGBUR NEGATIVE 07/30/2022 1018   BILIRUBINUR Negative 07/22/2023 0938   KETONESUR NEGATIVE 07/30/2022 1018   PROTEINUR Negative 07/22/2023 0938   PROTEINUR NEGATIVE 07/30/2022 1018   NITRITE Negative 07/22/2023 0938   NITRITE NEGATIVE 07/30/2022 1018   LEUKOCYTESUR Negative 07/22/2023 0938   LEUKOCYTESUR NEGATIVE 07/30/2022 1018   Sepsis Labs: @LABRCNTIP (procalcitonin:4,lacticidven:4)  )No results found for this or any previous visit (from the past 240 hours).   Radiology Studies: ECHOCARDIOGRAM COMPLETE Result Date: 11/19/2023    ECHOCARDIOGRAM REPORT   Patient Name:   Sean Villarreal Date of Exam: 11/19/2023 Medical Rec #:  [id number]         Height:       69.0 in Accession #:    [id number]        Weight:       187.4 lb Date of Birth:  Aug 03, 1940          BSA:          2.010 m Patient Age:    83 years          BP:           114/72 mmHg Patient Gender: M                 HR:           78 bpm. Exam Location:  Inpatient Procedure: 2D Echo (Both Spectral and Color Flow Doppler were utilized during            procedure). Indications:    NSTEMI  History:        Patient has  prior history of Echocardiogram examinations, most                 recent 04/10/2023. CAD; Risk Factors:Hypertension, Dyslipidemia                 and Diabetes.  Sonographer:    Tinnie Barefoot RDCS Referring Phys: [id number] DAWOOD S ELGERGAWY IMPRESSIONS  1. Left ventricular ejection fraction, by estimation, is 50 to 55%. The left ventricle has low normal function. The left ventricle demonstrates global hypokinesis. There is mild concentric left ventricular hypertrophy. Left ventricular diastolic parameters are consistent with Grade I diastolic dysfunction (impaired  relaxation).  2. Right ventricular systolic function is normal. The right ventricular size is normal. There is normal pulmonary artery systolic pressure.  3. The mitral valve is degenerative. Mild mitral valve regurgitation. No evidence of mitral stenosis. Moderate mitral annular calcification.  4. The aortic valve is calcified. There is mild calcification of the aortic valve. There is mild thickening of the aortic valve. Aortic valve regurgitation is not visualized. Moderate to severe aortic valve stenosis. Aortic valve area, by VTI measures 0.86 cm. Aortic valve mean gradient measures 18.0 mmHg. Aortic valve Vmax measures 2.74 m/s.  5. The inferior vena cava is normal in size with greater than 50% respiratory variability, suggesting right atrial pressure of 3 mmHg. FINDINGS  Left Ventricle: Left ventricular ejection fraction, by estimation, is 50 to 55%. The left ventricle has low normal function. The left ventricle demonstrates global hypokinesis. The left ventricular internal cavity size was normal in size. There is mild concentric left ventricular hypertrophy. Left ventricular diastolic parameters are consistent with Grade I diastolic dysfunction (impaired relaxation). Right Ventricle: The right ventricular size is normal. No increase in right ventricular wall thickness. Right ventricular systolic function is normal. There is normal pulmonary artery  systolic pressure. The tricuspid regurgitant velocity is 2.20 m/s, and  with an assumed right atrial pressure of 3 mmHg, the estimated right ventricular systolic pressure is 22.4 mmHg. Left Atrium: Left atrial size was normal in size. Right Atrium: Right atrial size was normal in size. Pericardium: There is no evidence of pericardial effusion. Mitral Valve: The mitral valve is degenerative in appearance. Moderate mitral annular calcification. Mild mitral valve regurgitation. No evidence of mitral valve stenosis. MV peak gradient, 5.4 mmHg. The mean mitral valve gradient is 3.0 mmHg. Tricuspid Valve: The tricuspid valve is normal in structure. Tricuspid valve regurgitation is not demonstrated. No evidence of tricuspid stenosis. Aortic Valve: The aortic valve is calcified. There is mild calcification of the aortic valve. There is mild thickening of the aortic valve. Aortic valve regurgitation is not visualized. Moderate to severe aortic stenosis is present. Aortic valve mean gradient measures 18.0 mmHg. Aortic valve peak gradient measures 30.0 mmHg. Aortic valve area, by VTI measures 0.86 cm. Pulmonic Valve: The pulmonic valve was normal in structure. Pulmonic valve regurgitation is mild. No evidence of pulmonic stenosis. Aorta: The aortic root is normal in size and structure. Venous: The inferior vena cava is normal in size with greater than 50% respiratory variability, suggesting right atrial pressure of 3 mmHg. IAS/Shunts: No atrial level shunt detected by color flow Doppler.  LEFT VENTRICLE PLAX 2D LVIDd:         5.20 cm     Diastology LVIDs:         4.00 cm     LV e' medial:    4.46 cm/s LV PW:         1.00 cm     LV E/e' medial:  18.5 LV IVS:        1.00 cm     LV e' lateral:   7.07 cm/s LVOT diam:     2.00 cm     LV E/e' lateral: 11.7 LV SV:         52 LV SV Index:   26 LVOT Area:     3.14 cm  LV Volumes (MOD) LV vol d, MOD A4C: 83.0 ml LV vol s, MOD A4C: 53.8 ml LV SV MOD A4C:     83.0 ml RIGHT VENTRICLE  IVC RV Basal diam:  3.10 cm    IVC diam: 2.00 cm RV S prime:     6.96 cm/s TAPSE (M-mode): 1.0 cm LEFT ATRIUM             Index        RIGHT ATRIUM           Index LA diam:        4.70 cm 2.34 cm/m   RA Area:     14.20 cm LA Vol (A2C):   56.9 ml 28.31 ml/m  RA Volume:   29.90 ml  14.88 ml/m LA Vol (A4C):   65.6 ml 32.64 ml/m LA Biplane Vol: 61.7 ml 30.70 ml/m  AORTIC VALVE AV Area (Vmax):    0.90 cm AV Area (Vmean):   0.86 cm AV Area (VTI):     0.86 cm AV Vmax:           274.00 cm/s AV Vmean:          198.000 cm/s AV VTI:            0.602 m AV Peak Grad:      30.0 mmHg AV Mean Grad:      18.0 mmHg LVOT Vmax:         78.50 cm/s LVOT Vmean:        54.450 cm/s LVOT VTI:          0.165 m LVOT/AV VTI ratio: 0.27  AORTA Ao Root diam: 3.30 cm Ao Asc diam:  3.30 cm MITRAL VALVE                TRICUSPID VALVE MV Area (PHT): 3.31 cm     TR Peak grad:   19.4 mmHg MV Area VTI:   1.72 cm     TR Vmax:        220.00 cm/s MV Peak grad:  5.4 mmHg MV Mean grad:  3.0 mmHg     SHUNTS MV Vmax:       1.16 m/s     Systemic VTI:  0.16 m MV Vmean:      80.3 cm/s    Systemic Diam: 2.00 cm MV Decel Time: 229 msec MV E velocity: 82.50 cm/s MV A velocity: 118.00 cm/s MV E/A ratio:  0.70 Kardie Tobb DO Electronically signed by Dub Huntsman DO Signature Date/Time: 11/19/2023/4:31:03 PM    Final    CT Angio Chest PE W and/or Wo Contrast Result Date: 11/18/2023 CLINICAL DATA:  Pulmonary embolism (PE) suspected, low to intermediate prob, positive D-dimer EXAM: CT ANGIOGRAPHY CHEST WITH CONTRAST TECHNIQUE: Multidetector CT imaging of the chest was performed using the standard protocol during bolus administration of intravenous contrast. Multiplanar CT image reconstructions and MIPs were obtained to evaluate the vascular anatomy. RADIATION DOSE REDUCTION: This exam was performed according to the departmental dose-optimization program which includes automated exposure control, adjustment of the mA and/or kV according to patient  size and/or use of iterative reconstruction technique. CONTRAST:  75mL OMNIPAQUE  IOHEXOL  350 MG/ML SOLN COMPARISON:  04/01/2022 FINDINGS: Pulmonary Embolism: No pulmonary embolism. Cardiovascular: Mild cardiomegaly. Dense multi-vessel coronary atherosclerosis with postsurgical changes of a prior CABG. No pericardial effusion.No aortic aneurysm. Normal variant 2 vessel aortic arch anatomy. Scattered calcified atherosclerosis throughout the aorta without stenosis. Mediastinum/Nodes: No mediastinal mass.No mediastinal, hilar, or axillary lymphadenopathy. Lungs/Pleura: The midline trachea and bronchi are patent. No focal airspace consolidation, pleural effusion, or pneumothorax. Posterior bibasilar dependent atelectasis. Musculoskeletal: No acute fracture or destructive bone lesion. Sternotomy wires. Multilevel degenerative  disc disease of the spine. Upper Abdomen: No acute abnormality in the partially visualized upper abdomen. Review of the MIP images confirms the above findings. IMPRESSION: No acute intrathoracic abnormality; specifically, no pulmonary embolism, pneumonia, or pleural effusion. Aortic Atherosclerosis (ICD10-I70.0). Electronically Signed   By: Rogelia Myers M.D.   On: 11/18/2023 18:19   DG Chest 2 View Result Date: 11/18/2023 EXAM: 2 VIEW(S) XRAY OF THE CHEST 11/18/2023 12:33:25 PM COMPARISON: 03/31/22 CLINICAL HISTORY: chest pain. pt states that he has a hx of heart attack and stents and a heart cath, states that he is here today because he woke up with a racing heart beat, states that it got a little bit better when he was laying down, but got worse when he was walking around, ; denies chest pain at this time FINDINGS: LUNGS AND PLEURA: No focal pulmonary opacity. No pulmonary edema. No pleural effusion. No pneumothorax. HEART AND MEDIASTINUM: Status post CABG. Aortic arch calcification. No acute abnormality of the cardiac and mediastinal silhouettes. BONES AND SOFT TISSUES: Overlying monitor  wires. Intact median sternotomy wires. Mild thoracic spondylosis. IMPRESSION: 1. No acute cardiopulmonary pathology. Electronically signed by: Taylor Stroud MD 11/18/2023 12:57 PM EDT RP Workstation: [id number]     Scheduled Meds:  aspirin  EC  81 mg Oral q AM   atorvastatin   80 mg Oral QHS   brinzolamide   1 drop Left Eye BID   And   brimonidine  1 drop Left Eye BID   carvedilol   12.5 mg Oral BID   clopidogrel   75 mg Oral Daily   ezetimibe  10 mg Oral QHS   furosemide   40 mg Oral Daily   insulin  aspart  0-5 Units Subcutaneous QHS   insulin  aspart  0-9 Units Subcutaneous TID WC   latanoprost   1 drop Left Eye QHS   pantoprazole   40 mg Oral BID   spironolactone   12.5 mg Oral Daily   tamsulosin   0.4 mg Oral BID   Continuous Infusions:  heparin  1,200 Units/hr (11/20/23 1007)     LOS: 2 days    Time spent:    Sigurd Pac, MD [facility]   11/20/2023, 11:01 AM

## 2023-11-20 NOTE — Progress Notes (Signed)
 PHARMACY - ANTICOAGULATION CONSULT NOTE  Pharmacy Consult for heparin  Indication: NSTEMI  HEPARIN  DW (KG): 85  Labs: Recent Labs    11/18/23 1201 11/18/23 1201 11/18/23 1923 11/19/23 0317 11/19/23 0900 11/19/23 1104 11/19/23 2028 11/20/23 0204 11/20/23 0602 11/20/23 0847 11/20/23 1826  HGB 15.6  --   --  15.1  --   --   --  14.6  --   --   --   HCT 48.1  --   --  45.1  --   --   --  44.8  --   --   --   PLT 166  --   --  184  --   --   --  138*  --   --   --   APTT  --   --  24  --   --   --   --   --   --   --   --   LABPROT  --   --  13.9  --   --   --   --   --   --   --   --   INR  --   --  1.0  --   --   --   --   --   --   --   --   HEPARINUNFRC  --    < >  --  0.29*  --  0.35 0.37  --   --  0.73* 0.41  CREATININE 0.98  --   --   --   --   --   --   --  0.97  --   --   TROPONINIHS  --   --   --   --  960* 1,069*  --   --   --   --   --    < > = values in this interval not displayed.   Assessment: 83yo male on heparin  with initial dosing for NSTEMI.  10/26: Heparin  level slightly supratherapeutic, 0.73, on heparin  after 2 therapeutic levels at 1300 units/hr. No issues with heparin  or s/sx of bleeding per nurse. CBC stable  PM update: HL 0.41 is therapeutic on 1200 units/hr. No issues with the infusion or bleeding reported.  Goal of Therapy:  Heparin  level 0.3-0.7 units/ml Monitor platelets by anticoagulation protocol: Yes    Plan:  Continue heparin  rate at 1200 units/hr Check confirmatory heparin  level with AM labs Daily anti-Xa and CBC  Rocky Slade, PharmD, BCPS Please refer to [facility] numbers 11/20/2023 7:18 PM

## 2023-11-20 NOTE — Progress Notes (Signed)
 Dr. Sheena asked us  to assist with pre-cath orders, patient listed on add-on board. He has contrast allergy. Since patient will be an add-on case, do not have official time yet for procedure but anticipate afternoon. Have added commentary to give first prednisone  dose at 2am in anticipation of potential 3pm case.

## 2023-11-20 NOTE — Plan of Care (Signed)
  Problem: Education: Goal: Knowledge of General Education information will improve Description: Including pain rating scale, medication(s)/side effects and non-pharmacologic comfort measures Outcome: Progressing   Problem: Health Behavior/Discharge Planning: Goal: Ability to manage health-related needs will improve Outcome: Progressing   Problem: Clinical Measurements: Goal: Ability to maintain clinical measurements within normal limits will improve Outcome: Progressing Goal: Will remain free from infection Outcome: Progressing Goal: Diagnostic test results will improve Outcome: Progressing Goal: Respiratory complications will improve Outcome: Progressing Goal: Cardiovascular complication will be avoided Outcome: Progressing   Problem: Activity: Goal: Risk for activity intolerance will decrease Outcome: Progressing   Problem: Nutrition: Goal: Adequate nutrition will be maintained Outcome: Progressing   Problem: Coping: Goal: Level of anxiety will decrease Outcome: Progressing   Problem: Elimination: Goal: Will not experience complications related to bowel motility Outcome: Progressing Goal: Will not experience complications related to urinary retention Outcome: Progressing   Problem: Pain Managment: Goal: General experience of comfort will improve and/or be controlled Outcome: Progressing   Problem: Safety: Goal: Ability to remain free from injury will improve Outcome: Progressing   Problem: Skin Integrity: Goal: Risk for impaired skin integrity will decrease Outcome: Progressing   Problem: Education: Goal: Ability to describe self-care measures that may prevent or decrease complications (Diabetes Survival Skills Education) will improve Outcome: Progressing Goal: Individualized Educational Video(s) Outcome: Progressing   Problem: Coping: Goal: Ability to adjust to condition or change in health will improve Outcome: Progressing   Problem: Fluid  Volume: Goal: Ability to maintain a balanced intake and output will improve Outcome: Progressing   Problem: Health Behavior/Discharge Planning: Goal: Ability to identify and utilize available resources and services will improve Outcome: Progressing Goal: Ability to manage health-related needs will improve Outcome: Progressing   Problem: Metabolic: Goal: Ability to maintain appropriate glucose levels will improve Outcome: Progressing   Problem: Nutritional: Goal: Maintenance of adequate nutrition will improve Outcome: Progressing Goal: Progress toward achieving an optimal weight will improve Outcome: Progressing   Problem: Skin Integrity: Goal: Risk for impaired skin integrity will decrease Outcome: Progressing   Problem: Tissue Perfusion: Goal: Adequacy of tissue perfusion will improve Outcome: Progressing   Problem: Education: Goal: Understanding of cardiac disease, CV risk reduction, and recovery process will improve Outcome: Progressing Goal: Individualized Educational Video(s) Outcome: Progressing   Problem: Activity: Goal: Ability to tolerate increased activity will improve Outcome: Progressing   Problem: Cardiac: Goal: Ability to achieve and maintain adequate cardiovascular perfusion will improve Outcome: Progressing   Problem: Health Behavior/Discharge Planning: Goal: Ability to safely manage health-related needs after discharge will improve Outcome: Progressing

## 2023-11-21 ENCOUNTER — Encounter (HOSPITAL_COMMUNITY): Payer: Self-pay | Admitting: Cardiovascular Disease

## 2023-11-21 ENCOUNTER — Encounter (HOSPITAL_COMMUNITY)
Admission: EM | Disposition: A | Discharge status: Home / Self Care | Payer: Self-pay | Source: Home / Self Care | Attending: Internal Medicine

## 2023-11-21 DIAGNOSIS — I214 Non-ST elevation (NSTEMI) myocardial infarction: Secondary | ICD-10-CM | POA: Diagnosis not present

## 2023-11-21 DIAGNOSIS — I350 Nonrheumatic aortic (valve) stenosis: Secondary | ICD-10-CM

## 2023-11-21 HISTORY — PX: LEFT HEART CATH AND CORONARY ANGIOGRAPHY: CATH118249

## 2023-11-21 LAB — CBC
HCT: 48.7 % (ref 39.0–52.0)
Hemoglobin: 15.6 g/dL (ref 13.0–17.0)
MCH: 31.3 pg (ref 26.0–34.0)
MCHC: 32 g/dL (ref 30.0–36.0)
MCV: 97.6 fL (ref 80.0–100.0)
Platelets: 130 K/uL — ABNORMAL LOW (ref 150–400)
RBC: 4.99 MIL/uL (ref 4.22–5.81)
RDW: 13.1 % (ref 11.5–15.5)
WBC: 11.9 K/uL — ABNORMAL HIGH (ref 4.0–10.5)
nRBC: 0 % (ref 0.0–0.2)

## 2023-11-21 LAB — BASIC METABOLIC PANEL WITH GFR
Anion gap: 11 (ref 5–15)
BUN: 20 mg/dL (ref 8–23)
CO2: 24 mmol/L (ref 22–32)
Calcium: 8.6 mg/dL — ABNORMAL LOW (ref 8.9–10.3)
Chloride: 103 mmol/L (ref 98–111)
Creatinine, Ser: 1.15 mg/dL (ref 0.61–1.24)
GFR, Estimated: >60 mL/min (ref >60)
Glucose, Bld: 114 mg/dL — ABNORMAL HIGH (ref 70–99)
Potassium: 3.4 mmol/L — ABNORMAL LOW (ref 3.5–5.1)
Sodium: 138 mmol/L (ref 135–145)

## 2023-11-21 LAB — GLUCOSE, CAPILLARY
Glucose-Capillary: 181 mg/dL — ABNORMAL HIGH (ref 70–99)
Glucose-Capillary: 197 mg/dL — ABNORMAL HIGH (ref 70–99)
Glucose-Capillary: 219 mg/dL — ABNORMAL HIGH (ref 70–99)
Glucose-Capillary: 187 mg/dL — ABNORMAL HIGH (ref 70–99)
Glucose-Capillary: 306 mg/dL — ABNORMAL HIGH (ref 70–99)

## 2023-11-21 LAB — HEMOGLOBIN A1C
Hgb A1c MFr Bld: 7.1 % — ABNORMAL HIGH (ref 4.8–5.6)
Mean Plasma Glucose: 157.07 mg/dL

## 2023-11-21 LAB — HEPARIN LEVEL (UNFRACTIONATED): Heparin Unfractionated: 0.42 [IU]/mL (ref 0.30–0.70)

## 2023-11-21 SURGERY — LEFT HEART CATH AND CORONARY ANGIOGRAPHY
Anesthesia: LOCAL

## 2023-11-21 MED ORDER — HYDRALAZINE HCL 20 MG/ML IJ SOLN: 10.0000 mg | INTRAMUSCULAR | Status: AC | PRN

## 2023-11-21 MED ORDER — FENTANYL CITRATE (PF) 100 MCG/2ML IJ SOLN
INTRAMUSCULAR | Status: DC | PRN
  Administered 2023-11-21: 25 ug via INTRAVENOUS

## 2023-11-21 MED ORDER — MIDAZOLAM HCL 2 MG/2ML IJ SOLN
INTRAMUSCULAR | Status: AC
  Filled 2023-11-21: qty 2

## 2023-11-21 MED ORDER — DIPHENHYDRAMINE HCL 25 MG PO CAPS: 50.0000 mg | ORAL_CAPSULE | Freq: Once | ORAL | Status: AC

## 2023-11-21 MED ORDER — MIDAZOLAM HCL (PF) 2 MG/2ML IJ SOLN
INTRAMUSCULAR | Status: DC | PRN
  Administered 2023-11-21: 1 mg via INTRAVENOUS

## 2023-11-21 MED ORDER — ASPIRIN 81 MG PO TBEC
81.0000 mg | DELAYED_RELEASE_TABLET | Freq: Every morning | ORAL | Status: DC
  Administered 2023-11-22: 81 mg via ORAL
  Filled 2023-11-21: qty 1

## 2023-11-21 MED ORDER — DIPHENHYDRAMINE HCL 50 MG/ML IJ SOLN
50.0000 mg | Freq: Once | INTRAMUSCULAR | Status: AC
  Administered 2023-11-21: 50 mg via INTRAVENOUS
  Filled 2023-11-21: qty 1

## 2023-11-21 MED ORDER — IOHEXOL 350 MG/ML SOLN
INTRAVENOUS | Status: DC | PRN
  Administered 2023-11-21: 20 mL

## 2023-11-21 MED ORDER — POTASSIUM CHLORIDE CRYS ER 20 MEQ PO TBCR
40.0000 meq | EXTENDED_RELEASE_TABLET | Freq: Once | ORAL | Status: AC
  Administered 2023-11-21: 40 meq via ORAL
  Filled 2023-11-21: qty 2

## 2023-11-21 MED ORDER — LABETALOL HCL 5 MG/ML IV SOLN: 10.0000 mg | INTRAVENOUS | Status: AC | PRN

## 2023-11-21 MED ORDER — FREE WATER
500.0000 mL | Freq: Once | Status: AC
  Administered 2023-11-21: 500 mL via ORAL

## 2023-11-21 MED ORDER — ASPIRIN 81 MG PO CHEW
81.0000 mg | CHEWABLE_TABLET | ORAL | Status: AC
  Administered 2023-11-21: 81 mg via ORAL
  Filled 2023-11-21: qty 1

## 2023-11-21 MED ORDER — SODIUM CHLORIDE 0.9 % IV SOLN: 250.0000 mL | INTRAVENOUS | Status: DC | PRN

## 2023-11-21 MED ORDER — SODIUM CHLORIDE 0.9% FLUSH: 3.0000 mL | INTRAVENOUS | Status: DC | PRN

## 2023-11-21 MED ORDER — FENTANYL CITRATE (PF) 100 MCG/2ML IJ SOLN
INTRAMUSCULAR | Status: AC
  Filled 2023-11-21: qty 2

## 2023-11-21 MED ORDER — HEPARIN (PORCINE) IN NACL 1000-0.9 UT/500ML-% IV SOLN
INTRAVENOUS | Status: DC | PRN
  Administered 2023-11-21: 1000 mL

## 2023-11-21 MED ORDER — LIDOCAINE HCL (PF) 1 % IJ SOLN
INTRAMUSCULAR | Status: DC | PRN
  Administered 2023-11-21: 5 mL via INTRADERMAL

## 2023-11-21 MED ORDER — SODIUM CHLORIDE 0.9% FLUSH
3.0000 mL | Freq: Two times a day (BID) | INTRAVENOUS | Status: DC
  Administered 2023-11-21 – 2023-11-22 (×2): 3 mL via INTRAVENOUS

## 2023-11-21 SURGICAL SUPPLY — 10 items
CATH INFINITI 5FR MULTPACK ANG (CATHETERS) IMPLANT
CATH SWAN GANZ 7F STRAIGHT (CATHETERS) IMPLANT
CLOSURE MYNX CONTROL 5F (Vascular Products) IMPLANT
COVER PRB 48X5XTLSCP FOLD TPE (BAG) IMPLANT
PACK CARDIAC CATHETERIZATION (CUSTOM PROCEDURE TRAY) ×1 IMPLANT
SET ATX-X65L (MISCELLANEOUS) IMPLANT
SHEATH PINNACLE 5F 10CM (SHEATH) IMPLANT
SHEATH PINNACLE 7F 10CM (SHEATH) IMPLANT
WIRE EMERALD 3MM-J .035X260CM (WIRE) IMPLANT
WIRE MICRO SET SILHO 5FR 7 (SHEATH) IMPLANT

## 2023-11-21 NOTE — Plan of Care (Signed)
  Problem: Education: Goal: Knowledge of General Education information will improve Description: Including pain rating scale, medication(s)/side effects and non-pharmacologic comfort measures Outcome: Progressing   Problem: Health Behavior/Discharge Planning: Goal: Ability to manage health-related needs will improve Outcome: Progressing   Problem: Clinical Measurements: Goal: Ability to maintain clinical measurements within normal limits will improve Outcome: Progressing Goal: Will remain free from infection Outcome: Progressing Goal: Diagnostic test results will improve Outcome: Progressing Goal: Respiratory complications will improve Outcome: Progressing Goal: Cardiovascular complication will be avoided Outcome: Progressing   Problem: Activity: Goal: Risk for activity intolerance will decrease Outcome: Progressing   Problem: Nutrition: Goal: Adequate nutrition will be maintained Outcome: Progressing   Problem: Coping: Goal: Level of anxiety will decrease Outcome: Progressing   Problem: Elimination: Goal: Will not experience complications related to bowel motility Outcome: Progressing Goal: Will not experience complications related to urinary retention Outcome: Progressing   Problem: Pain Managment: Goal: General experience of comfort will improve and/or be controlled Outcome: Progressing   Problem: Safety: Goal: Ability to remain free from injury will improve Outcome: Progressing   Problem: Skin Integrity: Goal: Risk for impaired skin integrity will decrease Outcome: Progressing   Problem: Education: Goal: Ability to describe self-care measures that may prevent or decrease complications (Diabetes Survival Skills Education) will improve Outcome: Progressing Goal: Individualized Educational Video(s) Outcome: Progressing   Problem: Coping: Goal: Ability to adjust to condition or change in health will improve Outcome: Progressing   Problem: Fluid  Volume: Goal: Ability to maintain a balanced intake and output will improve Outcome: Progressing   Problem: Health Behavior/Discharge Planning: Goal: Ability to identify and utilize available resources and services will improve Outcome: Progressing Goal: Ability to manage health-related needs will improve Outcome: Progressing   Problem: Metabolic: Goal: Ability to maintain appropriate glucose levels will improve Outcome: Progressing   Problem: Nutritional: Goal: Maintenance of adequate nutrition will improve Outcome: Progressing Goal: Progress toward achieving an optimal weight will improve Outcome: Progressing   Problem: Skin Integrity: Goal: Risk for impaired skin integrity will decrease Outcome: Progressing   Problem: Tissue Perfusion: Goal: Adequacy of tissue perfusion will improve Outcome: Progressing   Problem: Education: Goal: Understanding of cardiac disease, CV risk reduction, and recovery process will improve Outcome: Progressing Goal: Individualized Educational Video(s) Outcome: Progressing   Problem: Activity: Goal: Ability to tolerate increased activity will improve Outcome: Progressing   Problem: Cardiac: Goal: Ability to achieve and maintain adequate cardiovascular perfusion will improve Outcome: Progressing   Problem: Health Behavior/Discharge Planning: Goal: Ability to safely manage health-related needs after discharge will improve Outcome: Progressing

## 2023-11-21 NOTE — Progress Notes (Signed)
 PROGRESS NOTE    Sean Villarreal  FMW:[id number] DOB: 1940-10-12 DOA: 11/18/2023 PCP: Job Bolt, PA    83 y.o. male, with PMHx of HTN, T2DM, CAD s/p 2 DES to proximal to mid circumflex in 2018, CABG (LIMA-LAD, SVG- OM) in 2021, HFrEF, BPH, GERD, and OA , patient hospitalization at [facility] 9/15 patient underwent left heart cath that showed multivessel stenosis requiring lithotripsy and DES stent to the mid-RCA.  -Presented to ED secondary to complaints of generalized weakness, fatigue, and tachycardia, with mild chest discomfort - ED his workup significant for troponin trending up 132> 183, D-dimers were elevated at 1.07, but CTA chest negative for PE, his EKG nephric and for RBBB, LAFB with sinus tachycardia, ED discussed with cardiology, who recommended admission to [facility], and starting on heparin  drip due to concern for NSTEMI, cards following  Subjective: - Continues to have intermittent chest discomfort  Assessment and Plan:  NSTEMI Ischemic cardiomyopathy with HFrEF - Patient with recent hospitalization at [facility] 9/15 patient underwent left heart cath that showed multivessel stenosis requiring lithotripsy and DES stent to the mid-RCA.  -Troponin peaked at 1069, continues to have mild intermittent chest discomfort -Cards following, continue heparin  GTT, aspirin , Plavix , Coreg , statin and Zetia - Clinically appears euvolemic, echo with EF 50-55%, grade 1 DD, normal RV, moderate to severe aortic stenosis - Plan for LHC today  Moderate to severe aortic stenosis - Seen by Dr. Verlin today, recommended outpatient follow-up for TAVR eval  Hypertension - Meds as above, holding Entresto  and Imdur  today   Diabetes mellitus type 2 - A1c 7.1 - Hold home Trulicity and Amaryl  - Will keep on insulin  sliding scale   BPH - Continue with Flomax    GERD - Continue with PPI  DVT prophylaxis: IV heparin  Code Status: Full code Family Communication: None  present Disposition Plan: Home pending above workup  Consultants:    Procedures:   Antimicrobials:    Objective: Vitals:   11/20/23 2050 11/21/23 0000 11/21/23 0600 11/21/23 0745  BP: 118/70 120/74 118/74 115/72  Pulse: 88 88 85 83  Resp: 18 18 18 18   Temp: 98.2 F (36.8 C) 98.4 F (36.9 C) 98.2 F (36.8 C) 98 F (36.7 C)  TempSrc:  Oral Oral Oral  SpO2: 98% 98% 98% 97%  Weight:      Height:        Intake/Output Summary (Last 24 hours) at 11/21/2023 1146 Last data filed at 11/21/2023 0413 Gross per 24 hour  Intake 1056.31 ml  Output 670 ml  Net 386.31 ml   Filed Weights   11/18/23 1152 11/18/23 2244 11/19/23 0412  Weight: 85.7 kg 85 kg 85 kg    Examination:  General exam: Appears calm and comfortable, no distress Respiratory system: Clear to auscultation Cardiovascular system: S1 & S2 heard, RRR.  Systolic murmur Abd: nondistended, soft and nontender.Normal bowel sounds heard. Central nervous system: Alert and oriented. No focal neurological deficits. Extremities: no edema Skin: No rashes Psychiatry:  Mood & affect appropriate.     Data Reviewed:   CBC: Recent Labs  Lab 11/18/23 1201 11/19/23 0317 11/20/23 0204 11/21/23 0226  WBC 10.4 12.0* 9.2 11.9*  HGB 15.6 15.1 14.6 15.6  HCT 48.1 45.1 44.8 48.7  MCV 96.0 93.4 94.5 97.6  PLT 166 184 138* 130*   Basic Metabolic Panel: Recent Labs  Lab 11/16/23 0942 11/18/23 1201 11/20/23 0602 11/21/23 0220  NA 140 143 139 138  K 3.8 4.0 3.2* 3.4*  CL 103  103 101 103  CO2 26 26 25 24   GLUCOSE 132* 126* 113* 114*  BUN 17 15 21 20   CREATININE 0.93 0.98 0.97 1.15  CALCIUM  9.2 9.5 8.6* 8.6*   GFR: Estimated Creatinine Clearance: 53.5 mL/min (by C-G formula based on SCr of 1.15 mg/dL). Liver Function Tests: No results for input(s): AST, ALT, ALKPHOS, BILITOT, PROT, ALBUMIN  in the last 168 hours. No results for input(s): LIPASE, AMYLASE in the last 168 hours. No results for input(s):  AMMONIA in the last 168 hours. Coagulation Profile: Recent Labs  Lab 11/18/23 1923  INR 1.0   Cardiac Enzymes: No results for input(s): CKTOTAL, CKMB, CKMBINDEX, TROPONINI in the last 168 hours. BNP (last 3 results) No results for input(s): PROBNP in the last 8760 hours. HbA1C: Recent Labs    11/21/23 0226  HGBA1C 7.1*   CBG: Recent Labs  Lab 11/20/23 1550 11/20/23 2146 11/21/23 0602 11/21/23 0806 11/21/23 1125  GLUCAP 153* 148* 181* 197* 219*   Lipid Profile: No results for input(s): CHOL, HDL, LDLCALC, TRIG, CHOLHDL, LDLDIRECT in the last 72 hours. Thyroid  Function Tests: Recent Labs    11/18/23 1201  TSH 1.250   Anemia Panel: No results for input(s): VITAMINB12, FOLATE, FERRITIN, TIBC, IRON, RETICCTPCT in the last 72 hours. Urine analysis:    Component Value Date/Time   COLORURINE YELLOW 07/30/2022 1018   APPEARANCEUR Clear 07/22/2023 0938   LABSPEC 1.021 07/30/2022 1018   PHURINE 6.0 07/30/2022 1018   GLUCOSEU 3+ (A) 07/22/2023 0938   HGBUR NEGATIVE 07/30/2022 1018   BILIRUBINUR Negative 07/22/2023 0938   KETONESUR NEGATIVE 07/30/2022 1018   PROTEINUR Negative 07/22/2023 0938   PROTEINUR NEGATIVE 07/30/2022 1018   NITRITE Negative 07/22/2023 0938   NITRITE NEGATIVE 07/30/2022 1018   LEUKOCYTESUR Negative 07/22/2023 0938   LEUKOCYTESUR NEGATIVE 07/30/2022 1018   Sepsis Labs: @LABRCNTIP (procalcitonin:4,lacticidven:4)  )No results found for this or any previous visit (from the past 240 hours).   Radiology Studies: ECHOCARDIOGRAM COMPLETE Result Date: 11/19/2023    ECHOCARDIOGRAM REPORT   Patient Name:   Sean Villarreal Date of Exam: 11/19/2023 Medical Rec #:  [id number]         Height:       69.0 in Accession #:    [id number]        Weight:       187.4 lb Date of Birth:  [birth year]          BSA:          2.010 m Patient Age:    83 years          BP:           114/72 mmHg Patient Gender: M                 HR:            78 bpm. Exam Location:  Inpatient Procedure: 2D Echo (Both Spectral and Color Flow Doppler were utilized during            procedure). Indications:    NSTEMI  History:        Patient has prior history of Echocardiogram examinations, most                 recent 04/10/2023. CAD; Risk Factors:Hypertension, Dyslipidemia                 and Diabetes.  Sonographer:    Tinnie Barefoot RDCS Referring Phys: 84 [address] IMPRESSIONS  1. Left ventricular ejection  fraction, by estimation, is 50 to 55%. The left ventricle has low normal function. The left ventricle demonstrates global hypokinesis. There is mild concentric left ventricular hypertrophy. Left ventricular diastolic parameters are consistent with Grade I diastolic dysfunction (impaired relaxation).  2. Right ventricular systolic function is normal. The right ventricular size is normal. There is normal pulmonary artery systolic pressure.  3. The mitral valve is degenerative. Mild mitral valve regurgitation. No evidence of mitral stenosis. Moderate mitral annular calcification.  4. The aortic valve is calcified. There is mild calcification of the aortic valve. There is mild thickening of the aortic valve. Aortic valve regurgitation is not visualized. Moderate to severe aortic valve stenosis. Aortic valve area, by VTI measures 0.86 cm. Aortic valve mean gradient measures 18.0 mmHg. Aortic valve Vmax measures 2.74 m/s.  5. The inferior vena cava is normal in size with greater than 50% respiratory variability, suggesting right atrial pressure of 3 mmHg. FINDINGS  Left Ventricle: Left ventricular ejection fraction, by estimation, is 50 to 55%. The left ventricle has low normal function. The left ventricle demonstrates global hypokinesis. The left ventricular internal cavity size was normal in size. There is mild concentric left ventricular hypertrophy. Left ventricular diastolic parameters are consistent with Grade I diastolic dysfunction (impaired  relaxation). Right Ventricle: The right ventricular size is normal. No increase in right ventricular wall thickness. Right ventricular systolic function is normal. There is normal pulmonary artery systolic pressure. The tricuspid regurgitant velocity is 2.20 m/s, and  with an assumed right atrial pressure of 3 mmHg, the estimated right ventricular systolic pressure is 22.4 mmHg. Left Atrium: Left atrial size was normal in size. Right Atrium: Right atrial size was normal in size. Pericardium: There is no evidence of pericardial effusion. Mitral Valve: The mitral valve is degenerative in appearance. Moderate mitral annular calcification. Mild mitral valve regurgitation. No evidence of mitral valve stenosis. MV peak gradient, 5.4 mmHg. The mean mitral valve gradient is 3.0 mmHg. Tricuspid Valve: The tricuspid valve is normal in structure. Tricuspid valve regurgitation is not demonstrated. No evidence of tricuspid stenosis. Aortic Valve: The aortic valve is calcified. There is mild calcification of the aortic valve. There is mild thickening of the aortic valve. Aortic valve regurgitation is not visualized. Moderate to severe aortic stenosis is present. Aortic valve mean gradient measures 18.0 mmHg. Aortic valve peak gradient measures 30.0 mmHg. Aortic valve area, by VTI measures 0.86 cm. Pulmonic Valve: The pulmonic valve was normal in structure. Pulmonic valve regurgitation is mild. No evidence of pulmonic stenosis. Aorta: The aortic root is normal in size and structure. Venous: The inferior vena cava is normal in size with greater than 50% respiratory variability, suggesting right atrial pressure of 3 mmHg. IAS/Shunts: No atrial level shunt detected by color flow Doppler.  LEFT VENTRICLE PLAX 2D LVIDd:         5.20 cm     Diastology LVIDs:         4.00 cm     LV e' medial:    4.46 cm/s LV PW:         1.00 cm     LV E/e' medial:  18.5 LV IVS:        1.00 cm     LV e' lateral:   7.07 cm/s LVOT diam:     2.00 cm     LV  E/e' lateral: 11.7 LV SV:         52 LV SV Index:   26 LVOT Area:  3.14 cm  LV Volumes (MOD) LV vol d, MOD A4C: 83.0 ml LV vol s, MOD A4C: 53.8 ml LV SV MOD A4C:     83.0 ml RIGHT VENTRICLE            IVC RV Basal diam:  3.10 cm    IVC diam: 2.00 cm RV S prime:     6.96 cm/s TAPSE (M-mode): 1.0 cm LEFT ATRIUM             Index        RIGHT ATRIUM           Index LA diam:        4.70 cm 2.34 cm/m   RA Area:     14.20 cm LA Vol (A2C):   56.9 ml 28.31 ml/m  RA Volume:   29.90 ml  14.88 ml/m LA Vol (A4C):   65.6 ml 32.64 ml/m LA Biplane Vol: 61.7 ml 30.70 ml/m  AORTIC VALVE AV Area (Vmax):    0.90 cm AV Area (Vmean):   0.86 cm AV Area (VTI):     0.86 cm AV Vmax:           274.00 cm/s AV Vmean:          198.000 cm/s AV VTI:            0.602 m AV Peak Grad:      30.0 mmHg AV Mean Grad:      18.0 mmHg LVOT Vmax:         78.50 cm/s LVOT Vmean:        54.450 cm/s LVOT VTI:          0.165 m LVOT/AV VTI ratio: 0.27  AORTA Ao Root diam: 3.30 cm Ao Asc diam:  3.30 cm MITRAL VALVE                TRICUSPID VALVE MV Area (PHT): 3.31 cm     TR Peak grad:   19.4 mmHg MV Area VTI:   1.72 cm     TR Vmax:        220.00 cm/s MV Peak grad:  5.4 mmHg MV Mean grad:  3.0 mmHg     SHUNTS MV Vmax:       1.16 m/s     Systemic VTI:  0.16 m MV Vmean:      80.3 cm/s    Systemic Diam: 2.00 cm MV Decel Time: 229 msec MV E velocity: 82.50 cm/s MV A velocity: 118.00 cm/s MV E/A ratio:  0.70 Kardie Tobb DO Electronically signed by Kardie Tobb DO Signature Date/Time: 11/19/2023/4:31:03 PM    Final      Scheduled Meds:  [START ON 11/22/2023] aspirin  EC  81 mg Oral q AM   atorvastatin   80 mg Oral QHS   brinzolamide   1 drop Left Eye BID   And   brimonidine  1 drop Left Eye BID   carvedilol   12.5 mg Oral BID   clopidogrel   75 mg Oral Daily   diphenhydrAMINE   50 mg Oral Once   Or   diphenhydrAMINE   50 mg Intravenous Once   ezetimibe  10 mg Oral QHS   furosemide   40 mg Oral Daily   insulin  aspart  0-5 Units Subcutaneous QHS    insulin  aspart  0-9 Units Subcutaneous TID WC   latanoprost   1 drop Left Eye QHS   pantoprazole   40 mg Oral BID   predniSONE   50 mg Oral Q6H   spironolactone   12.5  mg Oral Daily   tamsulosin   0.4 mg Oral BID   Continuous Infusions:  heparin  1,200 Units/hr (11/21/23 0241)     LOS: 3 days    Time spent:    Sigurd Pac, MD [facility]   11/21/2023, 11:46 AM

## 2023-11-21 NOTE — Progress Notes (Addendum)
 Progress Note  Patient Name: Sean Villarreal Date of Encounter: 11/21/2023  Primary Cardiologist: Jayson Sierras, MD   Subjective   Mild chest pain today  Inpatient Medications    Scheduled Meds:  [START ON 11/22/2023] aspirin  EC  81 mg Oral q AM   atorvastatin   80 mg Oral QHS   brinzolamide   1 drop Left Eye BID   And   brimonidine  1 drop Left Eye BID   carvedilol   12.5 mg Oral BID   clopidogrel   75 mg Oral Daily   diphenhydrAMINE   50 mg Oral Once   Or   diphenhydrAMINE   50 mg Intravenous Once   ezetimibe  10 mg Oral QHS   furosemide   40 mg Oral Daily   insulin  aspart  0-5 Units Subcutaneous QHS   insulin  aspart  0-9 Units Subcutaneous TID WC   latanoprost   1 drop Left Eye QHS   pantoprazole   40 mg Oral BID   predniSONE   50 mg Oral Q6H   spironolactone   12.5 mg Oral Daily   tamsulosin   0.4 mg Oral BID   Continuous Infusions:  heparin  1,200 Units/hr (11/21/23 0241)   PRN Meds: acetaminophen , nitroGLYCERIN , ondansetron  (ZOFRAN ) IV   Vital Signs    Vitals:   11/20/23 2050 11/21/23 0000 11/21/23 0600 11/21/23 0745  BP: 118/70 120/74 118/74 115/72  Pulse: 88 88 85 83  Resp: 18 18 18 18   Temp: 98.2 F (36.8 C) 98.4 F (36.9 C) 98.2 F (36.8 C) 98 F (36.7 C)  TempSrc:  Oral Oral Oral  SpO2: 98% 98% 98% 97%  Weight:      Height:        Intake/Output Summary (Last 24 hours) at 11/21/2023 0839 Last data filed at 11/21/2023 0413 Gross per 24 hour  Intake 1296.31 ml  Output 1540 ml  Net -243.69 ml   Filed Weights   11/18/23 1152 11/18/23 2244 11/19/23 0412  Weight: 85.7 kg 85 kg 85 kg    Telemetry    Sinus- Personally Reviewed  ECG     No am EKG- Personally Reviewed  Physical Exam    General: Well developed, well nourished, NAD  HEENT: OP clear, mucus membranes moist  SKIN: warm, dry. No rashes. Neuro: No focal deficits  Musculoskeletal: Muscle strength 5/5 all ext  Psychiatric: Mood and affect normal  Neck: No JVD Lungs:Clear  bilaterally, no wheezes, rhonci, crackles Cardiovascular: Regular rate and rhythm. Systolic murmur Abdomen:Soft.  Extremities: No lower extremity edema.    Labs    Chemistry Recent Labs  Lab 11/18/23 1201 11/20/23 0602 11/21/23 0220  NA 143 139 138  K 4.0 3.2* 3.4*  CL 103 101 103  CO2 26 25 24   GLUCOSE 126* 113* 114*  BUN 15 21 20   CREATININE 0.98 0.97 1.15  CALCIUM  9.5 8.6* 8.6*  GFRNONAA >60 >60 >60  ANIONGAP 13 13 11      Hematology Recent Labs  Lab 11/19/23 0317 11/20/23 0204 11/21/23 0226  WBC 12.0* 9.2 11.9*  RBC 4.83 4.74 4.99  HGB 15.1 14.6 15.6  HCT 45.1 44.8 48.7  MCV 93.4 94.5 97.6  MCH 31.3 30.8 31.3  MCHC 33.5 32.6 32.0  RDW 13.2 13.2 13.1  PLT 184 138* 130*    Cardiac EnzymesNo results for input(s): TROPONINI in the last 168 hours. No results for input(s): TROPIPOC in the last 168 hours.   BNPNo results for input(s): BNP, PROBNP in the last 168 hours.   DDimer  Recent Labs  Lab 11/18/23  1201  DDIMER 1.07*     Radiology    ECHOCARDIOGRAM COMPLETE Result Date: 11/19/2023    ECHOCARDIOGRAM REPORT   Patient Name:   Sean Villarreal Date of Exam: 11/19/2023 Medical Rec #:  [id number]         Height:       69.0 in Accession #:    [id number]        Weight:       187.4 lb Date of Birth:  Jun 22, 1940          BSA:          2.010 m Patient Age:    83 years          BP:           114/72 mmHg Patient Gender: M                 HR:           78 bpm. Exam Location:  Inpatient Procedure: 2D Echo (Both Spectral and Color Flow Doppler were utilized during            procedure). Indications:    NSTEMI  History:        Patient has prior history of Echocardiogram examinations, most                 recent 04/10/2023. CAD; Risk Factors:Hypertension, Dyslipidemia                 and Diabetes.  Sonographer:    Tinnie Barefoot RDCS Referring Phys: 45 [address] S [address] IMPRESSIONS  1. Left ventricular ejection fraction, by estimation, is 50 to 55%. The left  ventricle has low normal function. The left ventricle demonstrates global hypokinesis. There is mild concentric left ventricular hypertrophy. Left ventricular diastolic parameters are consistent with Grade I diastolic dysfunction (impaired relaxation).  2. Right ventricular systolic function is normal. The right ventricular size is normal. There is normal pulmonary artery systolic pressure.  3. The mitral valve is degenerative. Mild mitral valve regurgitation. No evidence of mitral stenosis. Moderate mitral annular calcification.  4. The aortic valve is calcified. There is mild calcification of the aortic valve. There is mild thickening of the aortic valve. Aortic valve regurgitation is not visualized. Moderate to severe aortic valve stenosis. Aortic valve area, by VTI measures 0.86 cm. Aortic valve mean gradient measures 18.0 mmHg. Aortic valve Vmax measures 2.74 m/s.  5. The inferior vena cava is normal in size with greater than 50% respiratory variability, suggesting right atrial pressure of 3 mmHg. FINDINGS  Left Ventricle: Left ventricular ejection fraction, by estimation, is 50 to 55%. The left ventricle has low normal function. The left ventricle demonstrates global hypokinesis. The left ventricular internal cavity size was normal in size. There is mild concentric left ventricular hypertrophy. Left ventricular diastolic parameters are consistent with Grade I diastolic dysfunction (impaired relaxation). Right Ventricle: The right ventricular size is normal. No increase in right ventricular wall thickness. Right ventricular systolic function is normal. There is normal pulmonary artery systolic pressure. The tricuspid regurgitant velocity is 2.20 m/s, and  with an assumed right atrial pressure of 3 mmHg, the estimated right ventricular systolic pressure is 22.4 mmHg. Left Atrium: Left atrial size was normal in size. Right Atrium: Right atrial size was normal in size. Pericardium: There is no evidence of  pericardial effusion. Mitral Valve: The mitral valve is degenerative in appearance. Moderate mitral annular calcification. Mild mitral valve regurgitation. No evidence of mitral valve stenosis. MV  peak gradient, 5.4 mmHg. The mean mitral valve gradient is 3.0 mmHg. Tricuspid Valve: The tricuspid valve is normal in structure. Tricuspid valve regurgitation is not demonstrated. No evidence of tricuspid stenosis. Aortic Valve: The aortic valve is calcified. There is mild calcification of the aortic valve. There is mild thickening of the aortic valve. Aortic valve regurgitation is not visualized. Moderate to severe aortic stenosis is present. Aortic valve mean gradient measures 18.0 mmHg. Aortic valve peak gradient measures 30.0 mmHg. Aortic valve area, by VTI measures 0.86 cm. Pulmonic Valve: The pulmonic valve was normal in structure. Pulmonic valve regurgitation is mild. No evidence of pulmonic stenosis. Aorta: The aortic root is normal in size and structure. Venous: The inferior vena cava is normal in size with greater than 50% respiratory variability, suggesting right atrial pressure of 3 mmHg. IAS/Shunts: No atrial level shunt detected by color flow Doppler.  LEFT VENTRICLE PLAX 2D LVIDd:         5.20 cm     Diastology LVIDs:         4.00 cm     LV e' medial:    4.46 cm/s LV PW:         1.00 cm     LV E/e' medial:  18.5 LV IVS:        1.00 cm     LV e' lateral:   7.07 cm/s LVOT diam:     2.00 cm     LV E/e' lateral: 11.7 LV SV:         52 LV SV Index:   26 LVOT Area:     3.14 cm  LV Volumes (MOD) LV vol d, MOD A4C: 83.0 ml LV vol s, MOD A4C: 53.8 ml LV SV MOD A4C:     83.0 ml RIGHT VENTRICLE            IVC RV Basal diam:  3.10 cm    IVC diam: 2.00 cm RV S prime:     6.96 cm/s TAPSE (M-mode): 1.0 cm LEFT ATRIUM             Index        RIGHT ATRIUM           Index LA diam:        4.70 cm 2.34 cm/m   RA Area:     14.20 cm LA Vol (A2C):   56.9 ml 28.31 ml/m  RA Volume:   29.90 ml  14.88 ml/m LA Vol (A4C):   65.6  ml 32.64 ml/m LA Biplane Vol: 61.7 ml 30.70 ml/m  AORTIC VALVE AV Area (Vmax):    0.90 cm AV Area (Vmean):   0.86 cm AV Area (VTI):     0.86 cm AV Vmax:           274.00 cm/s AV Vmean:          198.000 cm/s AV VTI:            0.602 m AV Peak Grad:      30.0 mmHg AV Mean Grad:      18.0 mmHg LVOT Vmax:         78.50 cm/s LVOT Vmean:        54.450 cm/s LVOT VTI:          0.165 m LVOT/AV VTI ratio: 0.27  AORTA Ao Root diam: 3.30 cm Ao Asc diam:  3.30 cm MITRAL VALVE  TRICUSPID VALVE MV Area (PHT): 3.31 cm     TR Peak grad:   19.4 mmHg MV Area VTI:   1.72 cm     TR Vmax:        220.00 cm/s MV Peak grad:  5.4 mmHg MV Mean grad:  3.0 mmHg     SHUNTS MV Vmax:       1.16 m/s     Systemic VTI:  0.16 m MV Vmean:      80.3 cm/s    Systemic Diam: 2.00 cm MV Decel Time: 229 msec MV E velocity: 82.50 cm/s MV A velocity: 118.00 cm/s MV E/A ratio:  0.70 Kardie Tobb DO Electronically signed by Dub Huntsman DO Signature Date/Time: 11/19/2023/4:31:03 PM    Final     Cardiac Studies    Patient Profile     83 y.o. 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   Assessment & Plan    CAD s/p CABG with unstable angina/NSTEMI: Troponin elevated at 1.069 on last check 11/19/23. Plans in place for cardiac cath today. Pt is pain free today on IV heparin .  Plan cardiac cath today. If no targets for PCI, may have to look toward TAVR workup.    I have reviewed the risks, indications, and alternatives to cardiac catheterization, possible angioplasty, and stenting with the patient. Risks include but are not limited to bleeding, infection, vascular injury, stroke, myocardial infection, arrhythmia, kidney injury, radiation-related injury in the case of prolonged fluoroscopy use, emergency cardiac surgery, and death. The patient understands the risks of serious complication is 1-2 in 1000  with diagnostic cardiac cath and 1-2% or less with angioplasty/stenting.  Right femoral artery access (previous failed attempts at access from the right and left radial arteries)  Continue ASA, Plavix , statin, Zetia and Coreg .   Aortic stenosis: Moderate to severe low flow/low gradient AS with mean gradient 18 mmHg, AVA 0.86 cm2, SVI 26, DI 0.27. If he is not found to have any obstructive disease that is the culprit for his current presentation, will need to look toward outpatient TAVR workup. Echo images personally reviewed by me today.   Hyperlipidemia: LDL at goal on statin and Zetia.   For questions or updates, please contact [facility] Please consult [email or web address] for contact info under Cardiology/STEMI.      Signed, Lonni Cash, MD  11/21/2023, 8:39 AM

## 2023-11-21 NOTE — Progress Notes (Signed)
 Heart Failure Navigator Progress Note  Assessed for Heart & Vascular TOC clinic readiness.  Patient does not meet criteria due to EF 50-55%, No HF TOC per Dr. Jomarie Longs. .   Navigator will sign off at this time.   Rhae Hammock, BSN, Scientist, clinical (histocompatibility and immunogenetics) Only

## 2023-11-21 NOTE — H&P (View-Only) (Signed)
 Progress Note  Patient Name: Sean Villarreal Date of Encounter: 11/21/2023  Primary Cardiologist: Jayson Sierras, MD   Subjective   Mild chest pain today  Inpatient Medications    Scheduled Meds:  [START ON 11/22/2023] aspirin  EC  81 mg Oral q AM   atorvastatin   80 mg Oral QHS   brinzolamide   1 drop Left Eye BID   And   brimonidine  1 drop Left Eye BID   carvedilol   12.5 mg Oral BID   clopidogrel   75 mg Oral Daily   diphenhydrAMINE   50 mg Oral Once   Or   diphenhydrAMINE   50 mg Intravenous Once   ezetimibe  10 mg Oral QHS   furosemide   40 mg Oral Daily   insulin  aspart  0-5 Units Subcutaneous QHS   insulin  aspart  0-9 Units Subcutaneous TID WC   latanoprost   1 drop Left Eye QHS   pantoprazole   40 mg Oral BID   predniSONE   50 mg Oral Q6H   spironolactone   12.5 mg Oral Daily   tamsulosin   0.4 mg Oral BID   Continuous Infusions:  heparin  1,200 Units/hr (11/21/23 0241)   PRN Meds: acetaminophen , nitroGLYCERIN , ondansetron  (ZOFRAN ) IV   Vital Signs    Vitals:   11/20/23 2050 11/21/23 0000 11/21/23 0600 11/21/23 0745  BP: 118/70 120/74 118/74 115/72  Pulse: 88 88 85 83  Resp: 18 18 18 18   Temp: 98.2 F (36.8 C) 98.4 F (36.9 C) 98.2 F (36.8 C) 98 F (36.7 C)  TempSrc:  Oral Oral Oral  SpO2: 98% 98% 98% 97%  Weight:      Height:        Intake/Output Summary (Last 24 hours) at 11/21/2023 0839 Last data filed at 11/21/2023 0413 Gross per 24 hour  Intake 1296.31 ml  Output 1540 ml  Net -243.69 ml   Filed Weights   11/18/23 1152 11/18/23 2244 11/19/23 0412  Weight: 85.7 kg 85 kg 85 kg    Telemetry    Sinus- Personally Reviewed  ECG     No am EKG- Personally Reviewed  Physical Exam    General: Well developed, well nourished, NAD  HEENT: OP clear, mucus membranes moist  SKIN: warm, dry. No rashes. Neuro: No focal deficits  Musculoskeletal: Muscle strength 5/5 all ext  Psychiatric: Mood and affect normal  Neck: No JVD Lungs:Clear  bilaterally, no wheezes, rhonci, crackles Cardiovascular: Regular rate and rhythm. Systolic murmur Abdomen:Soft.  Extremities: No lower extremity edema.    Labs    Chemistry Recent Labs  Lab 11/18/23 1201 11/20/23 0602 11/21/23 0220  NA 143 139 138  K 4.0 3.2* 3.4*  CL 103 101 103  CO2 26 25 24   GLUCOSE 126* 113* 114*  BUN 15 21 20   CREATININE 0.98 0.97 1.15  CALCIUM  9.5 8.6* 8.6*  GFRNONAA >60 >60 >60  ANIONGAP 13 13 11      Hematology Recent Labs  Lab 11/19/23 0317 11/20/23 0204 11/21/23 0226  WBC 12.0* 9.2 11.9*  RBC 4.83 4.74 4.99  HGB 15.1 14.6 15.6  HCT 45.1 44.8 48.7  MCV 93.4 94.5 97.6  MCH 31.3 30.8 31.3  MCHC 33.5 32.6 32.0  RDW 13.2 13.2 13.1  PLT 184 138* 130*    Cardiac EnzymesNo results for input(s): TROPONINI in the last 168 hours. No results for input(s): TROPIPOC in the last 168 hours.   BNPNo results for input(s): BNP, PROBNP in the last 168 hours.   DDimer  Recent Labs  Lab 11/18/23  1201  DDIMER 1.07*     Radiology    ECHOCARDIOGRAM COMPLETE Result Date: 11/19/2023    ECHOCARDIOGRAM REPORT   Patient Name:   Sean Villarreal Date of Exam: 11/19/2023 Medical Rec #:  [id number]         Height:       69.0 in Accession #:    [id number]        Weight:       187.4 lb Date of Birth:  Jun 22, 1940          BSA:          2.010 m Patient Age:    83 years          BP:           114/72 mmHg Patient Gender: M                 HR:           78 bpm. Exam Location:  Inpatient Procedure: 2D Echo (Both Spectral and Color Flow Doppler were utilized during            procedure). Indications:    NSTEMI  History:        Patient has prior history of Echocardiogram examinations, most                 recent 04/10/2023. CAD; Risk Factors:Hypertension, Dyslipidemia                 and Diabetes.  Sonographer:    Tinnie Barefoot RDCS Referring Phys: [id number] DAWOOD S ELGERGAWY IMPRESSIONS  1. Left ventricular ejection fraction, by estimation, is 50 to 55%. The left  ventricle has low normal function. The left ventricle demonstrates global hypokinesis. There is mild concentric left ventricular hypertrophy. Left ventricular diastolic parameters are consistent with Grade I diastolic dysfunction (impaired relaxation).  2. Right ventricular systolic function is normal. The right ventricular size is normal. There is normal pulmonary artery systolic pressure.  3. The mitral valve is degenerative. Mild mitral valve regurgitation. No evidence of mitral stenosis. Moderate mitral annular calcification.  4. The aortic valve is calcified. There is mild calcification of the aortic valve. There is mild thickening of the aortic valve. Aortic valve regurgitation is not visualized. Moderate to severe aortic valve stenosis. Aortic valve area, by VTI measures 0.86 cm. Aortic valve mean gradient measures 18.0 mmHg. Aortic valve Vmax measures 2.74 m/s.  5. The inferior vena cava is normal in size with greater than 50% respiratory variability, suggesting right atrial pressure of 3 mmHg. FINDINGS  Left Ventricle: Left ventricular ejection fraction, by estimation, is 50 to 55%. The left ventricle has low normal function. The left ventricle demonstrates global hypokinesis. The left ventricular internal cavity size was normal in size. There is mild concentric left ventricular hypertrophy. Left ventricular diastolic parameters are consistent with Grade I diastolic dysfunction (impaired relaxation). Right Ventricle: The right ventricular size is normal. No increase in right ventricular wall thickness. Right ventricular systolic function is normal. There is normal pulmonary artery systolic pressure. The tricuspid regurgitant velocity is 2.20 m/s, and  with an assumed right atrial pressure of 3 mmHg, the estimated right ventricular systolic pressure is 22.4 mmHg. Left Atrium: Left atrial size was normal in size. Right Atrium: Right atrial size was normal in size. Pericardium: There is no evidence of  pericardial effusion. Mitral Valve: The mitral valve is degenerative in appearance. Moderate mitral annular calcification. Mild mitral valve regurgitation. No evidence of mitral valve stenosis. MV  peak gradient, 5.4 mmHg. The mean mitral valve gradient is 3.0 mmHg. Tricuspid Valve: The tricuspid valve is normal in structure. Tricuspid valve regurgitation is not demonstrated. No evidence of tricuspid stenosis. Aortic Valve: The aortic valve is calcified. There is mild calcification of the aortic valve. There is mild thickening of the aortic valve. Aortic valve regurgitation is not visualized. Moderate to severe aortic stenosis is present. Aortic valve mean gradient measures 18.0 mmHg. Aortic valve peak gradient measures 30.0 mmHg. Aortic valve area, by VTI measures 0.86 cm. Pulmonic Valve: The pulmonic valve was normal in structure. Pulmonic valve regurgitation is mild. No evidence of pulmonic stenosis. Aorta: The aortic root is normal in size and structure. Venous: The inferior vena cava is normal in size with greater than 50% respiratory variability, suggesting right atrial pressure of 3 mmHg. IAS/Shunts: No atrial level shunt detected by color flow Doppler.  LEFT VENTRICLE PLAX 2D LVIDd:         5.20 cm     Diastology LVIDs:         4.00 cm     LV e' medial:    4.46 cm/s LV PW:         1.00 cm     LV E/e' medial:  18.5 LV IVS:        1.00 cm     LV e' lateral:   7.07 cm/s LVOT diam:     2.00 cm     LV E/e' lateral: 11.7 LV SV:         52 LV SV Index:   26 LVOT Area:     3.14 cm  LV Volumes (MOD) LV vol d, MOD A4C: 83.0 ml LV vol s, MOD A4C: 53.8 ml LV SV MOD A4C:     83.0 ml RIGHT VENTRICLE            IVC RV Basal diam:  3.10 cm    IVC diam: 2.00 cm RV S prime:     6.96 cm/s TAPSE (M-mode): 1.0 cm LEFT ATRIUM             Index        RIGHT ATRIUM           Index LA diam:        4.70 cm 2.34 cm/m   RA Area:     14.20 cm LA Vol (A2C):   56.9 ml 28.31 ml/m  RA Volume:   29.90 ml  14.88 ml/m LA Vol (A4C):   65.6  ml 32.64 ml/m LA Biplane Vol: 61.7 ml 30.70 ml/m  AORTIC VALVE AV Area (Vmax):    0.90 cm AV Area (Vmean):   0.86 cm AV Area (VTI):     0.86 cm AV Vmax:           274.00 cm/s AV Vmean:          198.000 cm/s AV VTI:            0.602 m AV Peak Grad:      30.0 mmHg AV Mean Grad:      18.0 mmHg LVOT Vmax:         78.50 cm/s LVOT Vmean:        54.450 cm/s LVOT VTI:          0.165 m LVOT/AV VTI ratio: 0.27  AORTA Ao Root diam: 3.30 cm Ao Asc diam:  3.30 cm MITRAL VALVE  TRICUSPID VALVE MV Area (PHT): 3.31 cm     TR Peak grad:   19.4 mmHg MV Area VTI:   1.72 cm     TR Vmax:        220.00 cm/s MV Peak grad:  5.4 mmHg MV Mean grad:  3.0 mmHg     SHUNTS MV Vmax:       1.16 m/s     Systemic VTI:  0.16 m MV Vmean:      80.3 cm/s    Systemic Diam: 2.00 cm MV Decel Time: 229 msec MV E velocity: 82.50 cm/s MV A velocity: 118.00 cm/s MV E/A ratio:  0.70 Kardie Tobb DO Electronically signed by Dub Huntsman DO Signature Date/Time: 11/19/2023/4:31:03 PM    Final     Cardiac Studies    Patient Profile     83 y.o. 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   Assessment & Plan    CAD s/p CABG with unstable angina/NSTEMI: Troponin elevated at 1.069 on last check 11/19/23. Plans in place for cardiac cath today. Pt is pain free today on IV heparin .  Plan cardiac cath today. If no targets for PCI, may have to look toward TAVR workup.    I have reviewed the risks, indications, and alternatives to cardiac catheterization, possible angioplasty, and stenting with the patient. Risks include but are not limited to bleeding, infection, vascular injury, stroke, myocardial infection, arrhythmia, kidney injury, radiation-related injury in the case of prolonged fluoroscopy use, emergency cardiac surgery, and death. The patient understands the risks of serious complication is 1-2 in 1000  with diagnostic cardiac cath and 1-2% or less with angioplasty/stenting.  Right femoral artery access (previous failed attempts at access from the right and left radial arteries)  Continue ASA, Plavix , statin, Zetia and Coreg .   Aortic stenosis: Moderate to severe low flow/low gradient AS with mean gradient 18 mmHg, AVA 0.86 cm2, SVI 26, DI 0.27. If he is not found to have any obstructive disease that is the culprit for his current presentation, will need to look toward outpatient TAVR workup. Echo images personally reviewed by me today.   Hyperlipidemia: LDL at goal on statin and Zetia.   For questions or updates, please contact [facility] Please consult [email or web address] for contact info under Cardiology/STEMI.      Signed, Lonni Cash, MD  11/21/2023, 8:39 AM

## 2023-11-21 NOTE — Interval H&P Note (Signed)
 History and Physical Interval Note:  11/21/2023 3:34 PM  Sean Villarreal  has presented today for surgery, with the diagnosis of NSTEMI.  The various methods of treatment have been discussed with the patient and family. After consideration of risks, benefits and other options for treatment, the patient has consented to  Procedure(s): RIGHT/LEFT HEART CATH AND CORONARY ANGIOGRAPHY (N/A) as a surgical intervention.  The patient's history has been reviewed, patient examined, no change in status, stable for surgery.  I have reviewed the patient's chart and labs.  Questions were answered to the patient's satisfaction.    Cath Lab Visit (complete for each Cath Lab visit)  Clinical Evaluation Leading to the Procedure:   ACS: Yes.    Non-ACS:    Anginal Classification: CCS III  Anti-ischemic medical therapy: Minimal Therapy (1 class of medications)  Non-Invasive Test Results: No non-invasive testing performed  Prior CABG: Previous CABG   Lonni Cash

## 2023-11-21 NOTE — Progress Notes (Signed)
 PHARMACY - ANTICOAGULATION CONSULT NOTE  Pharmacy Consult for heparin  Indication: NSTEMI  HEPARIN  DW (KG): 85  Labs: Recent Labs    11/18/23 1201 11/18/23 1201 11/18/23 1923 11/19/23 0317 11/19/23 0900 11/19/23 1104 11/19/23 2028 11/20/23 0204 11/20/23 0602 11/20/23 0847 11/20/23 1826 11/21/23 0220 11/21/23 0226  HGB 15.6  --   --  15.1  --   --   --  14.6  --   --   --   --  15.6  HCT 48.1  --   --  45.1  --   --   --  44.8  --   --   --   --  48.7  PLT 166  --   --  184  --   --   --  138*  --   --   --   --  130*  APTT  --   --  24  --   --   --   --   --   --   --   --   --   --   LABPROT  --   --  13.9  --   --   --   --   --   --   --   --   --   --   INR  --   --  1.0  --   --   --   --   --   --   --   --   --   --   HEPARINUNFRC  --    < >  --  0.29*  --  0.35   < >  --   --  0.73* 0.41  --  0.42  CREATININE 0.98  --   --   --   --   --   --   --  0.97  --   --  1.15  --   TROPONINIHS  --   --   --   --  960* 1,069*  --   --   --   --   --   --   --    < > = values in this interval not displayed.   Assessment: 83yo male on heparin  with initial dosing for NSTEMI. No AC PTA.  Heparin  level came back therapeutic at 0.42, on 1200 units/hr. Hgb 15.6, plt 130 - stable. No s/sx of bleeding or infusion issues.   Goal of Therapy:  Heparin  level 0.3-0.7 units/ml Monitor platelets by anticoagulation protocol: Yes    Plan:  Continue heparin  rate at 1200 units/hr Daily anti-Xa and CBC F/u after cath   Thank you for allowing pharmacy to participate in this patient's care,  Suzen Sour, PharmD, BCCCP Clinical Pharmacist  Phone: [phone] 11/21/2023 10:11 AM  Please check AMION for all [facility] phone numbers After 10:00 PM, call Main Pharmacy [phone]

## 2023-11-22 ENCOUNTER — Other Ambulatory Visit (HOSPITAL_BASED_OUTPATIENT_CLINIC_OR_DEPARTMENT_OTHER): Payer: Self-pay

## 2023-11-22 ENCOUNTER — Other Ambulatory Visit: Payer: Self-pay

## 2023-11-22 DIAGNOSIS — I350 Nonrheumatic aortic (valve) stenosis: Secondary | ICD-10-CM | POA: Diagnosis not present

## 2023-11-22 DIAGNOSIS — I214 Non-ST elevation (NSTEMI) myocardial infarction: Secondary | ICD-10-CM | POA: Diagnosis not present

## 2023-11-22 DIAGNOSIS — R7989 Other specified abnormal findings of blood chemistry: Secondary | ICD-10-CM | POA: Diagnosis not present

## 2023-11-22 LAB — BASIC METABOLIC PANEL WITH GFR
Anion gap: 13 (ref 5–15)
BUN: 29 mg/dL — ABNORMAL HIGH (ref 8–23)
CO2: 22 mmol/L (ref 22–32)
Calcium: 8.9 mg/dL (ref 8.9–10.3)
Chloride: 100 mmol/L (ref 98–111)
Creatinine, Ser: 1.25 mg/dL — ABNORMAL HIGH (ref 0.61–1.24)
GFR, Estimated: 57 mL/min — ABNORMAL LOW (ref >60)
Glucose, Bld: 196 mg/dL — ABNORMAL HIGH (ref 70–99)
Potassium: 3.8 mmol/L (ref 3.5–5.1)
Sodium: 135 mmol/L (ref 135–145)

## 2023-11-22 LAB — CBC
HCT: 44.2 % (ref 39.0–52.0)
Hemoglobin: 14.8 g/dL (ref 13.0–17.0)
MCH: 30.8 pg (ref 26.0–34.0)
MCHC: 33.5 g/dL (ref 30.0–36.0)
MCV: 92.1 fL (ref 80.0–100.0)
Platelets: 152 K/uL (ref 150–400)
RBC: 4.8 MIL/uL (ref 4.22–5.81)
RDW: 12.9 % (ref 11.5–15.5)
WBC: 13.3 K/uL — ABNORMAL HIGH (ref 4.0–10.5)
nRBC: 0 % (ref 0.0–0.2)

## 2023-11-22 LAB — LIPID PANEL
Cholesterol: 111 mg/dL (ref 0–200)
HDL: 55 mg/dL (ref >40)
LDL Cholesterol: 46 mg/dL (ref 0–99)
Total CHOL/HDL Ratio: 2 ratio
Triglycerides: 50 mg/dL (ref <150)
VLDL: 10 mg/dL (ref 0–40)

## 2023-11-22 LAB — GLUCOSE, CAPILLARY: Glucose-Capillary: 164 mg/dL — ABNORMAL HIGH (ref 70–99)

## 2023-11-22 MED ORDER — PREDNISONE 50 MG PO TABS
ORAL_TABLET | ORAL | 0 refills | Status: DC
Start: 2023-11-22 — End: 2023-12-08
  Filled 2023-11-22: qty 3, 2d supply, fill #0

## 2023-11-22 NOTE — TOC CM/SW Note (Signed)
 Transition of Care [facility]) - Inpatient Brief Assessment   Patient Details  Name: Sean Villarreal MRN: [id number] Date of Birth: 12-08-1940  Transition of Care [facility]) CM/SW Contact:    Waddell Barnie Rama, RN Phone Number: 11/22/2023, 9:59 AM   Clinical Narrative: From home alone, indep, has PCP and insurance on file, states has no HH services in place at this time or DME at home.  States his neighbor  will transport them home at [employer] and family is support system,.  Pta self ambulatory.   There are no ICM needs identified  at this time.  Please place consult for ICM needs.     Transition of Care Asessment: Insurance and Status: Insurance coverage has been reviewed Patient has primary care physician: Yes Home environment has been reviewed: home alone Prior level of function:: indep Prior/Current Home Services: No current home services Social Drivers of Health Review: SDOH reviewed no interventions necessary Readmission risk has been reviewed: Yes Transition of care needs: no transition of care needs at this time

## 2023-11-22 NOTE — TOC Transition Note (Signed)
 Transition of Care [facility]) - Discharge Note   Patient Details  Name: Sean Villarreal MRN: [id number] Date of Birth: December 29, 1940  Transition of Care [facility]) CM/SW Contact:  Waddell Barnie Rama, RN Phone Number: 11/22/2023, 10:00 AM   Clinical Narrative:    For dc today, his neighbor will transport him home, he has no needs         Patient Goals and CMS Choice            Discharge Placement                       Discharge Plan and Services Additional resources added to the After Visit Summary for                                       Social Drivers of Health (SDOH) Interventions SDOH Screenings   Food Insecurity: No Food Insecurity (11/18/2023)  Housing: Low Risk  (11/18/2023)  Transportation Needs: No Transportation Needs (11/18/2023)  Utilities: Not At Risk (11/18/2023)  Social Connections: Socially Isolated (11/18/2023)  Tobacco Use: Medium Risk (11/18/2023)     Readmission Risk Interventions    11/22/2023    9:59 AM  Readmission Risk Prevention Plan  Medication Screening Complete  Transportation Screening Complete

## 2023-11-22 NOTE — Discharge Summary (Signed)
 Physician Discharge Summary  MCKINNON GLICK FMW:[id number] DOB: [birth year] DOA: 11/18/2023  PCP: Job Bolt, PA  Admit date: 11/18/2023 Discharge date: 11/22/2023  Time spent: 45 minutes  Recommendations for Outpatient Follow-up:  Follow-up with structural heart team for TAVR workup   Discharge Diagnoses:  Moderate to severe aortic stenosis   NSTEMI (non-ST elevated myocardial infarction) (HCC) Chronic diastolic CHF   Type II diabetes mellitus (HCC)   Hypertension   GERD (gastroesophageal reflux disease)   Discharge Condition: Improved  Diet recommendation: Loose odium, heart healthy, diabetic  Filed Weights   11/18/23 1152 11/18/23 2244 11/19/23 0412  Weight: 85.7 kg 85 kg 85 kg    History of present illness:  83 y.o. male, with PMHx of HTN, T2DM, CAD s/p 2 DES to proximal to mid circumflex in 2018, CABG (LIMA-LAD, SVG- OM) in 2021, HFrEF, BPH, GERD, and OA , [facility] patient underwent left heart cath that showed multivessel stenosis requiring lithotripsy and DES stent to the mid-RCA.  -Presented to ED secondary to complaints of generalized weakness, fatigue, and tachycardia, with mild chest discomfort - ED his workup significant for troponin trending up 132> 183, D-dimers were elevated at 1.07, but CTA chest negative for PE, his EKG nephric and for RBBB, LAFB with sinus tachycardia, ED discussed with cardiology, who recommended admission to [facility], and starting on heparin  drip due to concern for NSTEMI, cards following  Hospital Course:   NSTEMI Ischemic cardiomyopathy with HFrEF - Patient with recent hospitalization at [facility] 9/15 patient underwent left heart cath that showed multivessel stenosis requiring lithotripsy and DES stent to the mid-RCA.  -Troponin peaked at 1069, continues to have mild intermittent chest discomfort -Cards following, continue heparin  GTT, aspirin , Plavix , Coreg , statin and Zetia -  Clinically appears euvolemic, echo with EF 50-55%, grade 1 DD, normal RV, moderate to severe aortic stenosis - LHC yesterday noted patent LIMA to mid LAD, SVG-OM and patent recent RCA stent -Continue aspirin  Plavix  and statin, suspect his symptoms are secondary to severe aortic stenosis, see discussion below   Moderate to severe aortic stenosis - Seen by Dr. Verlin today, recommended outpatient follow-up for TAVR eval   Chronic diastolic CHF Hypertension - Was euvolemic throughout this admission, allegedly not on Entresto  any longer, Farxiga , Lasix  and Aldactone  were resumed   Diabetes mellitus type 2 - A1c 7.1 - Resume Trulicity and Amaryl    BPH - Continue with Flomax    GERD - Continue with PPI  Discharge Exam: Vitals:   11/22/23 0735 11/22/23 0821  BP: 114/69 108/62  Pulse: 77 84  Resp: 18 18  Temp: 97.8 F (36.6 C)   SpO2: 97% 99%   Gen: Awake, Alert, Oriented X 3,  HEENT: no JVD Lungs: Good air movement bilaterally, CTAB CVS: S1S2/RRR, systolic murmur Abd: soft, Non tender, non distended, BS present Extremities: No edema Skin: no new rashes on exposed skin   Discharge Instructions   Discharge Instructions     Diet - low sodium heart healthy   Complete by: As directed    Diet Carb Modified   Complete by: As directed    Increase activity slowly   Complete by: As directed       Allergies as of 11/22/2023       Reactions   Contrast Media [iodinated Contrast Media] Hives, Other (See Comments)   Had welts on arm, and kidney issues after given dye in the past   Jardiance  [empagliflozin ] Diarrhea   Penicillins Other (See  Comments)   Passed out as a child        Medication List     STOP taking these medications    isosorbide  mononitrate 30 MG 24 hr tablet Commonly known as: IMDUR    triamcinolone cream 0.1 % Commonly known as: KENALOG       TAKE these medications    acetaminophen  500 MG tablet Commonly known as: TYLENOL  Take 1,000 mg by  mouth every 6 (six) hours as needed for mild pain (pain score 1-3).   aspirin  EC 81 MG tablet Take 81 mg by mouth in the morning. Swallow whole.   atorvastatin  80 MG tablet Commonly known as: LIPITOR  Take 1 tablet (80 mg total) by mouth daily. What changed: when to take this   carvedilol  12.5 MG tablet Commonly known as: COREG  Take 1 tablet (12.5 mg total) by mouth 2 (two) times daily.   clopidogrel  75 MG tablet Commonly known as: PLAVIX  Take 1 tablet (75 mg total) by mouth daily.   dapagliflozin  propanediol 10 MG Tabs tablet Commonly known as: FARXIGA  Take 1 tablet (10 mg total) by mouth daily before breakfast.   ezetimibe 10 MG tablet Commonly known as: ZETIA Take 10 mg by mouth at bedtime.   fluticasone  50 MCG/ACT nasal spray Commonly known as: FLONASE  Place 1 spray into both nostrils daily as needed for allergies or rhinitis.   furosemide  40 MG tablet Commonly known as: LASIX  Take 1 tablet (40 mg total) by mouth daily.   glimepiride  1 MG tablet Commonly known as: AMARYL  Take 1 tablet (1 mg total) by mouth daily.   ketoconazole  2 % cream Commonly known as: NIZORAL  Apply topically to affected area twice daily. What changed:  how much to take when to take this   latanoprost  0.005 % ophthalmic solution Commonly known as: XALATAN  Place 1 drop into the left eye at bedtime.   Magnesium  400 MG Tabs Take 1 tablet by mouth daily.   metFORMIN  1000 MG tablet Commonly known as: GLUCOPHAGE  Take 1 tablet (1,000 mg total) by mouth 2 (two) times daily.   nitroGLYCERIN  0.4 MG SL tablet Commonly known as: NITROSTAT  Place 0.4 mg under the tongue every 5 (five) minutes x 3 doses as needed for chest pain.   potassium chloride  SA 20 MEQ tablet Commonly known as: KLOR-CON  M Take 1 tablet (20 mEq total) by mouth daily.   Simbrinza 1-0.2 % Susp Generic drug: Brinzolamide -Brimonidine Place 1 drop into the left eye 2 (two) times daily.   spironolactone  25 MG  tablet Commonly known as: ALDACTONE  Take 0.5 tablets (12.5 mg total) by mouth daily.   tamsulosin  0.4 MG Caps capsule Commonly known as: FLOMAX  Take 1 capsule (0.4 mg total) by mouth 2 (two) times daily.   Trulicity 0.75 MG/0.5ML Soaj Generic drug: Dulaglutide Inject 0.75 mg as directed once a week. What changed: when to take this       Allergies  Allergen Reactions   Contrast Media [Iodinated Contrast Media] Hives and Other (See Comments)    Had welts on arm, and kidney issues after given dye in the past   Jardiance  [Empagliflozin ] Diarrhea   Penicillins Other (See Comments)    Passed out as a child       The results of significant diagnostics from this hospitalization (including imaging, microbiology, ancillary and laboratory) are listed below for reference.    Significant Diagnostic Studies: CARDIAC CATHETERIZATION Result Date: 11/21/2023   Lida Cx to Prox Cx lesion is 100% stenosed.   Ost LAD to  Prox LAD lesion is 99% stenosed.   Mid LM to Ost LAD lesion is 99% stenosed.   Mid LAD lesion is 100% stenosed.   Previously placed Prox RCA to Mid RCA stent of unknown type is  widely patent.   LIMA graft was visualized by angiography and is normal in caliber.   SVG graft was visualized by angiography and is normal in caliber. Chronic occlusion proximal LAD. Patent LIMA to mid LAD filling the mid and distal LAD Chronic occlusion ostial Circumflex. Patent vein graft to obtuse marginal branch. Large dominant RCA with patent mid stented segment with no restenosis. Normal LVEDP Severe low flow/low gradient aortic stenosis by echo. Recommendations: I suspect that his symptoms are related to his aortic stenosis. Will monitor overnight. Will plan outpatient pre-TAVR CT scans and visit with the CT surgeon and continue down pathway for TAVR.   ECHOCARDIOGRAM COMPLETE Result Date: 11/19/2023    ECHOCARDIOGRAM REPORT   Patient Name:   Sean Villarreal Date of Exam: 11/19/2023 Medical Rec #:   [id number]         Height:       69.0 in Accession #:    [id number]        Weight:       187.4 lb Date of Birth:  Oct 03, 1940          BSA:          2.010 m Patient Age:    83 years          BP:           114/72 mmHg Patient Gender: M                 HR:           78 bpm. Exam Location:  Inpatient Procedure: 2D Echo (Both Spectral and Color Flow Doppler were utilized during            procedure). Indications:    NSTEMI  History:        Patient has prior history of Echocardiogram examinations, most                 recent 04/10/2023. CAD; Risk Factors:Hypertension, Dyslipidemia                 and Diabetes.  Sonographer:    Tinnie Barefoot RDCS Referring Phys: [id number] DAWOOD S ELGERGAWY IMPRESSIONS  1. Left ventricular ejection fraction, by estimation, is 50 to 55%. The left ventricle has low normal function. The left ventricle demonstrates global hypokinesis. There is mild concentric left ventricular hypertrophy. Left ventricular diastolic parameters are consistent with Grade I diastolic dysfunction (impaired relaxation).  2. Right ventricular systolic function is normal. The right ventricular size is normal. There is normal pulmonary artery systolic pressure.  3. The mitral valve is degenerative. Mild mitral valve regurgitation. No evidence of mitral stenosis. Moderate mitral annular calcification.  4. The aortic valve is calcified. There is mild calcification of the aortic valve. There is mild thickening of the aortic valve. Aortic valve regurgitation is not visualized. Moderate to severe aortic valve stenosis. Aortic valve area, by VTI measures 0.86 cm. Aortic valve mean gradient measures 18.0 mmHg. Aortic valve Vmax measures 2.74 m/s.  5. The inferior vena cava is normal in size with greater than 50% respiratory variability, suggesting right atrial pressure of 3 mmHg. FINDINGS  Left Ventricle: Left ventricular ejection fraction, by estimation, is 50 to 55%. The left ventricle has low normal function. The left  ventricle  demonstrates global hypokinesis. The left ventricular internal cavity size was normal in size. There is mild concentric left ventricular hypertrophy. Left ventricular diastolic parameters are consistent with Grade I diastolic dysfunction (impaired relaxation). Right Ventricle: The right ventricular size is normal. No increase in right ventricular wall thickness. Right ventricular systolic function is normal. There is normal pulmonary artery systolic pressure. The tricuspid regurgitant velocity is 2.20 m/s, and  with an assumed right atrial pressure of 3 mmHg, the estimated right ventricular systolic pressure is 22.4 mmHg. Left [facility]: Left atrial size was normal in size. Right [facility]: Right atrial size was normal in size. Pericardium: There is no evidence of pericardial effusion. Mitral Valve: The mitral valve is degenerative in appearance. Moderate mitral annular calcification. Mild mitral valve regurgitation. No evidence of mitral valve stenosis. MV peak gradient, 5.4 mmHg. The mean mitral valve gradient is 3.0 mmHg. Tricuspid Valve: The tricuspid valve is normal in structure. Tricuspid valve regurgitation is not demonstrated. No evidence of tricuspid stenosis. Aortic Valve: The aortic valve is calcified. There is mild calcification of the aortic valve. There is mild thickening of the aortic valve. Aortic valve regurgitation is not visualized. Moderate to severe aortic stenosis is present. Aortic valve mean gradient measures 18.0 mmHg. Aortic valve peak gradient measures 30.0 mmHg. Aortic valve area, by VTI measures 0.86 cm. Pulmonic Valve: The pulmonic valve was normal in structure. Pulmonic valve regurgitation is mild. No evidence of pulmonic stenosis. Aorta: The aortic root is normal in size and structure. Venous: The inferior vena cava is normal in size with greater than 50% respiratory variability, suggesting right atrial pressure of 3 mmHg. IAS/Shunts: No atrial level shunt detected by color flow  Doppler.  LEFT VENTRICLE PLAX 2D LVIDd:         5.20 cm     Diastology LVIDs:         4.00 cm     LV e' medial:    4.46 cm/s LV PW:         1.00 cm     LV E/e' medial:  18.5 LV IVS:        1.00 cm     LV e' lateral:   7.07 cm/s LVOT diam:     2.00 cm     LV E/e' lateral: 11.7 LV SV:         52 LV SV Index:   26 LVOT Area:     3.14 cm  LV Volumes (MOD) LV vol d, MOD A4C: 83.0 ml LV vol s, MOD A4C: 53.8 ml LV SV MOD A4C:     83.0 ml RIGHT VENTRICLE            IVC RV Basal diam:  3.10 cm    IVC diam: 2.00 cm RV S prime:     6.96 cm/s TAPSE (M-mode): 1.0 cm LEFT ATRIUM             Index        RIGHT ATRIUM           Index LA diam:        4.70 cm 2.34 cm/m   RA Area:     14.20 cm LA Vol (A2C):   56.9 ml 28.31 ml/m  RA Volume:   29.90 ml  14.88 ml/m LA Vol (A4C):   65.6 ml 32.64 ml/m LA Biplane Vol: 61.7 ml 30.70 ml/m  AORTIC VALVE AV Area (Vmax):    0.90 cm AV Area (Vmean):   0.86 cm AV Area (VTI):  0.86 cm AV Vmax:           274.00 cm/s AV Vmean:          198.000 cm/s AV VTI:            0.602 m AV Peak Grad:      30.0 mmHg AV Mean Grad:      18.0 mmHg LVOT Vmax:         78.50 cm/s LVOT Vmean:        54.450 cm/s LVOT VTI:          0.165 m LVOT/AV VTI ratio: 0.27  AORTA Ao Root diam: 3.30 cm Ao Asc diam:  3.30 cm MITRAL VALVE                TRICUSPID VALVE MV Area (PHT): 3.31 cm     TR Peak grad:   19.4 mmHg MV Area VTI:   1.72 cm     TR Vmax:        220.00 cm/s MV Peak grad:  5.4 mmHg MV Mean grad:  3.0 mmHg     SHUNTS MV Vmax:       1.16 m/s     Systemic VTI:  0.16 m MV Vmean:      80.3 cm/s    Systemic Diam: 2.00 cm MV Decel Time: 229 msec MV E velocity: 82.50 cm/s MV A velocity: 118.00 cm/s MV E/A ratio:  0.70 Kardie Tobb DO Electronically signed by Dub Huntsman DO Signature Date/Time: 11/19/2023/4:31:03 PM    Final    CT Angio Chest PE W and/or Wo Contrast Result Date: 11/18/2023 CLINICAL DATA:  Pulmonary embolism (PE) suspected, low to intermediate prob, positive D-dimer EXAM: CT ANGIOGRAPHY CHEST  WITH CONTRAST TECHNIQUE: Multidetector CT imaging of the chest was performed using the standard protocol during bolus administration of intravenous contrast. Multiplanar CT image reconstructions and MIPs were obtained to evaluate the vascular anatomy. RADIATION DOSE REDUCTION: This exam was performed according to the departmental dose-optimization program which includes automated exposure control, adjustment of the mA and/or kV according to patient size and/or use of iterative reconstruction technique. CONTRAST:  75mL OMNIPAQUE  IOHEXOL  350 MG/ML SOLN COMPARISON:  04/01/2022 FINDINGS: Pulmonary Embolism: No pulmonary embolism. Cardiovascular: Mild cardiomegaly. Dense multi-vessel coronary atherosclerosis with postsurgical changes of a prior CABG. No pericardial effusion.No aortic aneurysm. Normal variant 2 vessel aortic arch anatomy. Scattered calcified atherosclerosis throughout the aorta without stenosis. Mediastinum/Nodes: No mediastinal mass.No mediastinal, hilar, or axillary lymphadenopathy. Lungs/Pleura: The midline trachea and bronchi are patent. No focal airspace consolidation, pleural effusion, or pneumothorax. Posterior bibasilar dependent atelectasis. Musculoskeletal: No acute fracture or destructive bone lesion. Sternotomy wires. Multilevel degenerative disc disease of the spine. Upper Abdomen: No acute abnormality in the partially visualized upper abdomen. Review of the MIP images confirms the above findings. IMPRESSION: No acute intrathoracic abnormality; specifically, no pulmonary embolism, pneumonia, or pleural effusion. Aortic Atherosclerosis (ICD10-I70.0). Electronically Signed   By: Rogelia Myers M.D.   On: 11/18/2023 18:19   DG Chest 2 View Result Date: 11/18/2023 EXAM: 2 VIEW(S) XRAY OF THE CHEST 11/18/2023 12:33:25 PM COMPARISON: 03/31/22 CLINICAL HISTORY: chest pain. pt states that he has a hx of heart attack and stents and a heart cath, states that he is here today because he woke up with  a racing heart beat, states that it got a little bit better when he was laying down, but got worse when he was walking around, ; denies chest pain at this time FINDINGS: LUNGS AND PLEURA: No  focal pulmonary opacity. No pulmonary edema. No pleural effusion. No pneumothorax. HEART AND MEDIASTINUM: Status post CABG. Aortic arch calcification. No acute abnormality of the cardiac and mediastinal silhouettes. BONES AND SOFT TISSUES: Overlying monitor wires. Intact median sternotomy wires. Mild thoracic spondylosis. IMPRESSION: 1. No acute cardiopulmonary pathology. Electronically signed by: Waddell Calk MD 11/18/2023 12:57 PM EDT RP Workstation: [id number]    Microbiology: No results found for this or any previous visit (from the past 240 hours).   Labs: Basic Metabolic Panel: Recent Labs  Lab 11/16/23 0942 11/18/23 1201 11/20/23 0602 11/21/23 0220 11/22/23 0229  NA 140 143 139 138 135  K 3.8 4.0 3.2* 3.4* 3.8  CL 103 103 101 103 100  CO2 26 26 25 24 22   GLUCOSE 132* 126* 113* 114* 196*  BUN 17 15 21 20  29*  CREATININE 0.93 0.98 0.97 1.15 1.25*  CALCIUM  9.2 9.5 8.6* 8.6* 8.9   Liver Function Tests: No results for input(s): AST, ALT, ALKPHOS, BILITOT, PROT, ALBUMIN  in the last 168 hours. No results for input(s): LIPASE, AMYLASE in the last 168 hours. No results for input(s): AMMONIA in the last 168 hours. CBC: Recent Labs  Lab 11/18/23 1201 11/19/23 0317 11/20/23 0204 11/21/23 0226 11/22/23 0229  WBC 10.4 12.0* 9.2 11.9* 13.3*  HGB 15.6 15.1 14.6 15.6 14.8  HCT 48.1 45.1 44.8 48.7 44.2  MCV 96.0 93.4 94.5 97.6 92.1  PLT 166 184 138* 130* 152   Cardiac Enzymes: No results for input(s): CKTOTAL, CKMB, CKMBINDEX, TROPONINI in the last 168 hours. BNP: BNP (last 3 results) No results for input(s): BNP in the last 8760 hours.  ProBNP (last 3 results) No results for input(s): PROBNP in the last 8760 hours.  CBG: Recent Labs  Lab 11/21/23 0806  11/21/23 1125 11/21/23 1635 11/21/23 2128 11/22/23 0610  GLUCAP 197* 219* 187* 306* 164*       Signed:  Sigurd Pac MD.  [facility] 11/22/2023, 9:34 AM

## 2023-11-22 NOTE — Plan of Care (Signed)
  Problem: Education: Goal: Knowledge of General Education information will improve Description: Including pain rating scale, medication(s)/side effects and non-pharmacologic comfort measures Outcome: Progressing   Problem: Health Behavior/Discharge Planning: Goal: Ability to manage health-related needs will improve Outcome: Progressing   Problem: Clinical Measurements: Goal: Ability to maintain clinical measurements within normal limits will improve Outcome: Progressing Goal: Will remain free from infection Outcome: Progressing Goal: Diagnostic test results will improve Outcome: Progressing Goal: Respiratory complications will improve Outcome: Progressing Goal: Cardiovascular complication will be avoided Outcome: Progressing   Problem: Activity: Goal: Risk for activity intolerance will decrease Outcome: Progressing   Problem: Nutrition: Goal: Adequate nutrition will be maintained Outcome: Progressing   Problem: Coping: Goal: Level of anxiety will decrease Outcome: Progressing   Problem: Elimination: Goal: Will not experience complications related to bowel motility Outcome: Progressing Goal: Will not experience complications related to urinary retention Outcome: Progressing   Problem: Pain Managment: Goal: General experience of comfort will improve and/or be controlled Outcome: Progressing   Problem: Safety: Goal: Ability to remain free from injury will improve Outcome: Progressing   Problem: Skin Integrity: Goal: Risk for impaired skin integrity will decrease Outcome: Progressing   Problem: Education: Goal: Ability to describe self-care measures that may prevent or decrease complications (Diabetes Survival Skills Education) will improve Outcome: Progressing Goal: Individualized Educational Video(s) Outcome: Progressing   Problem: Coping: Goal: Ability to adjust to condition or change in health will improve Outcome: Progressing   Problem: Fluid  Volume: Goal: Ability to maintain a balanced intake and output will improve Outcome: Progressing   Problem: Health Behavior/Discharge Planning: Goal: Ability to identify and utilize available resources and services will improve Outcome: Progressing Goal: Ability to manage health-related needs will improve Outcome: Progressing   Problem: Metabolic: Goal: Ability to maintain appropriate glucose levels will improve Outcome: Progressing   Problem: Nutritional: Goal: Maintenance of adequate nutrition will improve Outcome: Progressing Goal: Progress toward achieving an optimal weight will improve Outcome: Progressing   Problem: Skin Integrity: Goal: Risk for impaired skin integrity will decrease Outcome: Progressing   Problem: Tissue Perfusion: Goal: Adequacy of tissue perfusion will improve Outcome: Progressing   Problem: Education: Goal: Understanding of cardiac disease, CV risk reduction, and recovery process will improve Outcome: Progressing Goal: Individualized Educational Video(s) Outcome: Progressing   Problem: Activity: Goal: Ability to tolerate increased activity will improve Outcome: Progressing   Problem: Cardiac: Goal: Ability to achieve and maintain adequate cardiovascular perfusion will improve Outcome: Progressing   Problem: Health Behavior/Discharge Planning: Goal: Ability to safely manage health-related needs after discharge will improve Outcome: Progressing   Problem: Education: Goal: Understanding of CV disease, CV risk reduction, and recovery process will improve Outcome: Progressing Goal: Individualized Educational Video(s) Outcome: Progressing   Problem: Activity: Goal: Ability to return to baseline activity level will improve Outcome: Progressing   Problem: Cardiovascular: Goal: Ability to achieve and maintain adequate cardiovascular perfusion will improve Outcome: Progressing Goal: Vascular access site(s) Level 0-1 will be  maintained Outcome: Progressing   Problem: Health Behavior/Discharge Planning: Goal: Ability to safely manage health-related needs after discharge will improve Outcome: Progressing

## 2023-11-22 NOTE — Care Management Important Message (Signed)
 Important Message  Patient Details  Name: Sean Villarreal MRN: [id number] Date of Birth: [birth year]   Important Message Given:  Yes - Medicare IM     Vonzell Arrie Sharps 11/22/2023, 12:18 PM

## 2023-11-22 NOTE — Progress Notes (Addendum)
 Progress Note  Patient Name: Sean Villarreal Date of Encounter: 11/22/2023 [facility] Cardiologist: Jayson Sierras, MD   Interval Summary    Feels well this morning, no chest pain with activity but tired.   Vital Signs Vitals:   11/21/23 1945 11/22/23 0011 11/22/23 0338 11/22/23 0735  BP: 112/65 108/74 98/64 114/69  Pulse: 78 73 89 77  Resp: 18 18 18 18   Temp: 97.9 F (36.6 C) 97.6 F (36.4 C) 97.8 F (36.6 C) 97.8 F (36.6 C)  TempSrc: Oral Oral Oral Oral  SpO2: 96% 99% 97% 97%  Weight:      Height:        Intake/Output Summary (Last 24 hours) at 11/22/2023 0810 Last data filed at 11/22/2023 0000 Gross per 24 hour  Intake 748.59 ml  Output 1450 ml  Net -701.41 ml      11/19/2023    4:12 AM 11/18/2023   10:44 PM 11/18/2023   11:52 AM  Last 3 Weights  Weight (lbs) 187 lb 6.3 oz 187 lb 6.3 oz 189 lb  Weight (kg) 85 kg 85 kg 85.73 kg      Telemetry/ECG   Sinus Rhythm - Personally Reviewed  Physical Exam  GEN: No acute distress.   Neck: No JVD Cardiac: RRR, 2/6 systolic murmur RUSB,  no rubs, or gallops.  Respiratory: Clear to auscultation bilaterally. GI: Soft, nontender, non-distended  MS: No edema  Assessment & Plan   83 y.o. 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   CAD s/p CABG '21 NSTEMI -- presented with chest pain, hsTn peaked at 1069 -- underwent cardiac cath 10/27 with patent LIMA-mLAD, SVG-OM, and patent RCA stent -- suspected symptoms are 2/2 to AS -- continue ASA, plavix , statin   Aortic stenosis -- echo 10/25 showed moderate to severe aortic valve stenosis.  Aortic valve area, by VTI measures 0.86 cm. Aortic valve mean gradient measures 18.0 mmHg. Aortic valve Vmax  measures 2.74 m/s -- planned for outpatient TAVR CT and CT surgery evaluation  -- on lasix  40mg  daily   HLD -- continue  atorvastatin  80mg  daily, zetia  HTN -- controlled -- continue coreg  12.5mg  BID, spiro 12.5mg  daily   Will arrange outpatient follow up   For questions or updates, please contact [facility] Please consult [email or web address] for contact info under       Signed, Sean Rummer, NP   Sean Villarreal was seen by me today along with Sean Rummer, NP. I have personally performed an evaluation on this patient.  My findings are as follows: 83 y.o. 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 Cardiac cath with patent bypass grafts and patent RCA stent. His symptoms are felt to be due to his severe AS.   Data: EKG(s) and pertinent labs, studies, etc were personally reviewed and interpreted by me:  NO am EKG Tele: sinus Labs reviewed by me Otherwise, I agree with data as outlined by the advanced practice provider.  Exam performed by me: Gen: NAD Neck: No JVD Cardiac: RRR with systolic murmur Lungs: clear bilaterally Extremities: no LE edema  My Assessment and Plan:  CAD s/p CABG with chest pain, elevated troponin: No culprit lesion found on cardiac  cath yesterday. Continue medical management of CAD with ASA, Plavix , statin  Severe AS: Continue workup for TAVR without CT scans in the outpatient setting  HTN: BP controlled.   OK to d/c home today  Signed,  Sean Cash, MD , [facility] 11/22/2023 10:23 AM

## 2023-11-23 ENCOUNTER — Telehealth: Payer: Self-pay | Admitting: Cardiology

## 2023-11-23 ENCOUNTER — Telehealth: Payer: Self-pay | Admitting: *Deleted

## 2023-11-23 ENCOUNTER — Other Ambulatory Visit (HOSPITAL_BASED_OUTPATIENT_CLINIC_OR_DEPARTMENT_OTHER): Payer: Self-pay

## 2023-11-23 ENCOUNTER — Ambulatory Visit: Admitting: Cardiology

## 2023-11-23 NOTE — Telephone Encounter (Signed)
 See previous TOC phone note for details

## 2023-11-23 NOTE — Telephone Encounter (Signed)
 Patient informed and verbalized understanding of plan.

## 2023-11-23 NOTE — Telephone Encounter (Signed)
 Pt c/o medication issue:  1. Name of Medication: Entresto   2. How are you currently taking this medication (dosage and times per day)? Unknown  3. Are you having a reaction (difficulty breathing--STAT)? no  4. What is your medication issue? Pt states current medication is not updated and would like a c/b from a nurse to verify please advise

## 2023-11-23 NOTE — Telephone Encounter (Signed)
No answer.  Voicemail not set up. 

## 2023-11-23 NOTE — Transitions of Care (Post Inpatient/ED Visit) (Signed)
 11/23/2023  Name: Sean Villarreal MRN: [id number] DOB: 06-16-1940  Today's TOC FU Call Status: Today's TOC FU Call Status:: Successful TOC FU Call Completed TOC FU Call Complete Date: 11/23/23 Patient's Name and Date of Birth confirmed.  Transition Care Management Follow-up Telephone Call Date of Discharge: 11/22/23 Discharge Facility: [facility]) Type of Discharge: Inpatient Admission Primary Inpatient Discharge Diagnosis:: tachycardia in setting of recent NSTEMI- possible plans for scheduled TAVR in future How have you been since you were released from the hospital?: Better (I am doing fine; independent and have been managing my own healthcare and plan to continue staying independent.  I took the Entresto  this morning, I don't know why it isn't on the list from the hospital discharge) Any questions or concerns?: Yes Patient Questions/Concerns:: Patient has been on Entresto  for a long time prescribed by his cardiologist, Dr. Debera whom he is well established with- hospital discharge medication list did not list Entresto  as a continued or discontinued medication: patient reports he has taken and plans to continue taking Entresto  until he hears back from his cardiology provider: verbalizes plans to contact cardiology provider for clarification as to whether he should continue taking later this afternoon after I get my hair cut at 1:00 pm Patient Questions/Concerns Addressed: Notified Provider of Patient Questions/Concerns (sent high priority message to Dr. Debera requesting clarification; offered enrollment into [facility] 30-day program for ongoing support/ assistance, however patient adamantly declined enrollment into TOC 30-day program)  Items Reviewed: Did you receive and understand the discharge instructions provided?: Yes (thoroughly reviewed with patient who verbalizes good understanding of same) Medications obtained,verified, and reconciled?: Yes (Medications Reviewed) (Full  medication reconciliation/ review completed; concerns-discrepancies identified as above; confirmed no newly Rx'd medications post-hospital discharge; self-manages medications and states he will seek clarification around Entresto  independently) Any new allergies since your discharge?: No Dietary orders reviewed?: Yes Type of Diet Ordered:: Heart Healthy/ low salt/ diabetic Do you have support at home?: Yes People in Home [RPT]: alone Name of Support/Comfort Primary Source: Reports independent in self-care activities; resides alone; local supportive adult children assists as/ if needed/ indicated  Medications Reviewed Today: Medications Reviewed Today     Reviewed by Wei Newbrough M, RN (Registered Nurse) on 11/23/23 at 1213  Med List Status: <None>   Medication Order Taking? Sig Documenting Provider Last Dose Status Informant  acetaminophen  (TYLENOL ) 500 MG tablet [id number] Yes Take 1,000 mg by mouth every 6 (six) hours as needed for mild pain (pain score 1-3). [provider]  Active Self, Pharmacy Records  aspirin  EC 81 MG tablet [id number] Yes Take 81 mg by mouth in the morning. Swallow whole. [provider]  Active Self, Pharmacy Records  atorvastatin  (LIPITOR ) 80 MG tablet [id number] Yes Take 1 tablet (80 mg total) by mouth daily.   Active Self, Pharmacy Records           Med Note (WARD, CHUCK MATSU   Fri Nov 18, 2023  3:31 PM) Pt stated he was took off Lipitor  then stated later he also takes something for cholesterol that starts with an A??  carvedilol  (COREG ) 12.5 MG tablet [id number] Yes Take 1 tablet (12.5 mg total) by mouth 2 (two) times daily. Debera Jayson MATSU, MD  Active Self, Pharmacy Records  clopidogrel  (PLAVIX ) 75 MG tablet [id number] Yes Take 1 tablet (75 mg total) by mouth daily. Debera Jayson MATSU, MD  Active Self, Pharmacy Records  dapagliflozin  propanediol (FARXIGA ) 10 MG TABS tablet [id number] Yes Take 1 tablet (10 mg  total) by mouth daily before  breakfast. Miriam Norris, NP  Active Self, Pharmacy Records           Med Note [facility], CHUCK MATSU   Fri Nov 18, 2023  3:32 PM) Pt stated he doesn't think he takes this but was unsure?  Dulaglutide (TRULICITY) 0.75 MG/0.5ML SOAJ [id number] Yes Inject 0.75 mg as directed once a week.   Active Self, Pharmacy Records  ezetimibe (ZETIA) 10 MG tablet [id number] Yes Take 10 mg by mouth at bedtime. [provider]  Active Self, Pharmacy Records  fluticasone  (FLONASE ) 50 MCG/ACT nasal spray [id number] Yes Place 1 spray into both nostrils daily as needed for allergies or rhinitis. [provider]  Active Self, Pharmacy Records  furosemide  (LASIX ) 40 MG tablet [id number] Yes Take 1 tablet (40 mg total) by mouth daily. Debera Jayson MATSU, MD  Active Self, Pharmacy Records  glimepiride  (AMARYL ) 1 MG tablet [id number] Yes Take 1 tablet (1 mg total) by mouth daily.   Active Self, Pharmacy Records  ketoconazole  (NIZORAL ) 2 % cream [id number] Yes Apply topically to affected area twice daily.   Active Self, Pharmacy Records  latanoprost  (XALATAN ) 0.005 % ophthalmic solution [id number] Yes Place 1 drop into the left eye at bedtime. [provider]  Active Self, Pharmacy Records  Magnesium  400 MG TABS [id number] Yes Take 1 tablet by mouth daily. Debera Jayson MATSU, MD  Active Self, Pharmacy Records  metFORMIN  (GLUCOPHAGE ) 1000 MG tablet [id number] Yes Take 1 tablet (1,000 mg total) by mouth 2 (two) times daily.   Active Self, Pharmacy Records  nitroGLYCERIN  (NITROSTAT ) 0.4 MG SL tablet [id number] Yes Place 0.4 mg under the tongue every 5 (five) minutes x 3 doses as needed for chest pain. [provider]  Active Self, Pharmacy Records  potassium chloride  SA (KLOR-CON  M) 20 MEQ tablet [id number] Yes Take 1 tablet (20 mEq total) by mouth daily. Miriam Norris, NP  Active Self, Pharmacy Records  predniSONE  (DELTASONE ) 50 MG tablet [id number]  1) Take 13 hours prior to test @ 9:30 PM on 11/30/23. 2)  Take another Prednisone  50 mg 7 hours prior to test @ 3:30AM on 12/01/23. 3) Take another Prednisone  50 mg 1 hour prior to test @ 9:30AM (bring this to the appointment with you). Verlin Lonni BIRCH, MD  Active   SIMBRINZA 1-0.2 % CONCHETTA [id number] Yes Place 1 drop into the left eye 2 (two) times daily. [provider]  Active Self, Pharmacy Records  spironolactone  (ALDACTONE ) 25 MG tablet [id number] Yes Take 0.5 tablets (12.5 mg total) by mouth daily. Miriam Norris, NP  Active Self, Pharmacy Records  tamsulosin  (FLOMAX ) 0.4 MG CAPS capsule [id number] Yes Take 1 capsule (0.4 mg total) by mouth 2 (two) times daily.   Active Self, Pharmacy Records           Home Care and Equipment/Supplies: Were Home Health Services Ordered?: No Any new equipment or medical supplies ordered?: No  Functional Questionnaire: Do you need assistance with bathing/showering or dressing?: No Do you need assistance with meal preparation?: No Do you need assistance with eating?: No Do you have difficulty maintaining continence: No Do you need assistance with getting out of bed/getting out of a chair/moving?: No Do you have difficulty managing or taking your medications?: No  Follow up appointments reviewed: PCP Follow-up appointment confirmed?: No (patient declined scheduling PCP appointment with independent PCP Practice ([facility]) until after the appointments with the heart doctors; encouraged patient to schedule with PCP promptly- he states he will do independently)  MD Provider Line Number:[phone] Given: No (verified well-established with current PCP) Specialist Hospital Follow-up appointment confirmed?: Yes Date of Specialist follow-up appointment?: 12/01/23 Follow-Up Specialty Provider:: cardiology provider on 12/01/23 and then again scheduled for 12/08/23; confirmed also has appointment with [facility] 12/15/23 for TAVR consult Do you need transportation to your follow-up appointment?: No Do you  understand care options if your condition(s) worsen?: Yes-patient verbalized understanding  SDOH Interventions Today    Flowsheet Row Most Recent Value  SDOH Interventions   Food Insecurity Interventions Intervention Not Indicated  Housing Interventions Intervention Not Indicated  Transportation Interventions Intervention Not Indicated  [drives self,  adult local children assist as indicated]  Utilities Interventions Intervention Not Indicated   See TOC assessment tabs for additional assessment/ TOC intervention information Provided education around safe use of NTG at home  Care Coordination outreach as above to cardiology provider re: clarification of whether patient should continue taking Entresto  post-hospital discharge  Patient declines need for ongoing/ further care management/ coordination outreach; declines enrollment in 30-day TOC program- declines taking my direct phone number should needs/ concerns arise post-TOC call   Pls call/ message for questions,  Mischell Branford Mckinney Erline Siddoway, RN, BSN, Media Planner  Transitions of Care  [facility] - Population Health  [place] [phone]: direct office

## 2023-11-25 DIAGNOSIS — E119 Type 2 diabetes mellitus without complications: Secondary | ICD-10-CM | POA: Diagnosis not present

## 2023-11-25 DIAGNOSIS — I10 Essential (primary) hypertension: Secondary | ICD-10-CM | POA: Diagnosis not present

## 2023-11-29 ENCOUNTER — Ambulatory Visit: Payer: Self-pay | Admitting: Cardiology

## 2023-11-29 ENCOUNTER — Telehealth: Payer: Self-pay | Admitting: Cardiology

## 2023-11-29 DIAGNOSIS — R1031 Right lower quadrant pain: Secondary | ICD-10-CM

## 2023-11-29 NOTE — Telephone Encounter (Signed)
 STAT Groin Pseudo ultrasound scheduled for tomorrow at 11:30 am at [facility]. Patient aware of appointment. Will fax to ordering PCP.

## 2023-11-29 NOTE — Telephone Encounter (Signed)
 Melissa with [facility] called in stating pcp wants pt to have a stat peripheral arterial duplex. Please advise if we offer these.

## 2023-11-29 NOTE — Telephone Encounter (Signed)
 Spoke with Melissa at [facility] and advised to fax over office note and order for doppler so that it can be reviewed by the provider to determine. Fax number given.

## 2023-11-30 ENCOUNTER — Ambulatory Visit (HOSPITAL_COMMUNITY)
Admission: RE | Admit: 2023-11-30 | Discharge: 2023-11-30 | Disposition: A | Discharge status: Home / Self Care | Source: Ambulatory Visit | Attending: Physician Assistant | Admitting: Physician Assistant

## 2023-11-30 ENCOUNTER — Telehealth (HOSPITAL_COMMUNITY): Payer: Self-pay | Admitting: *Deleted

## 2023-11-30 DIAGNOSIS — R1031 Right lower quadrant pain: Secondary | ICD-10-CM | POA: Insufficient documentation

## 2023-11-30 NOTE — Telephone Encounter (Signed)
 Attempted to call patient regarding upcoming cardiac CT appointment. Left message on voicemail with name and callback number  Larey Brick RN Navigator Cardiac Imaging [facility] [phone] Office [phone] Cell

## 2023-12-01 ENCOUNTER — Inpatient Hospital Stay (HOSPITAL_BASED_OUTPATIENT_CLINIC_OR_DEPARTMENT_OTHER)
Admit: 2023-12-01 | Discharge: 2023-12-01 | Disposition: A | Discharge status: Home / Self Care | Attending: Cardiovascular Disease | Admitting: Cardiovascular Disease

## 2023-12-01 DIAGNOSIS — I350 Nonrheumatic aortic (valve) stenosis: Secondary | ICD-10-CM

## 2023-12-01 DIAGNOSIS — Z954 Presence of other heart-valve replacement: Secondary | ICD-10-CM | POA: Diagnosis not present

## 2023-12-01 DIAGNOSIS — I517 Cardiomegaly: Secondary | ICD-10-CM | POA: Diagnosis not present

## 2023-12-01 DIAGNOSIS — K551 Chronic vascular disorders of intestine: Secondary | ICD-10-CM | POA: Diagnosis not present

## 2023-12-01 DIAGNOSIS — Z01818 Encounter for other preprocedural examination: Secondary | ICD-10-CM | POA: Diagnosis not present

## 2023-12-01 MED ORDER — IOHEXOL 350 MG/ML SOLN
100.0000 mL | Freq: Once | INTRAVENOUS | Status: AC | PRN
  Administered 2023-12-01: 100 mL via INTRAVENOUS

## 2023-12-02 ENCOUNTER — Ambulatory Visit: Payer: Self-pay | Admitting: Cardiovascular Disease

## 2023-12-06 ENCOUNTER — Telehealth: Payer: Self-pay | Admitting: *Deleted

## 2023-12-06 NOTE — Telephone Encounter (Signed)
 Received referral from Rocky Don with [facility]~ [phone]~ telephone/ [phone]~ fax  Reason for referral: Possible inguinal hernia   Patient reports right groin pain S/P recent heart catheterization/ NSTEMI   Vascular US  cannot rule out hernia: 4.3x 1.1s 3.2 hypoechoic structure in the lateral right symphysis pubics fossa.  Advised patient that he is not currently a candidate for surgery in [place] due to complexity of case and recent NSTEMI. Advised if he requires evaluation of possible hernia, he will need tertiary care.

## 2023-12-07 ENCOUNTER — Ambulatory Visit: Admitting: General Surgery

## 2023-12-08 ENCOUNTER — Other Ambulatory Visit (HOSPITAL_BASED_OUTPATIENT_CLINIC_OR_DEPARTMENT_OTHER): Payer: Self-pay

## 2023-12-08 ENCOUNTER — Encounter: Payer: Self-pay | Admitting: Student

## 2023-12-08 ENCOUNTER — Ambulatory Visit: Attending: Student | Admitting: Student

## 2023-12-08 VITALS — BP 110/64 | HR 84 | Ht 69.0 in | Wt 178.2 lb

## 2023-12-08 DIAGNOSIS — I5020 Unspecified systolic (congestive) heart failure: Secondary | ICD-10-CM | POA: Insufficient documentation

## 2023-12-08 DIAGNOSIS — E785 Hyperlipidemia, unspecified: Secondary | ICD-10-CM | POA: Insufficient documentation

## 2023-12-08 DIAGNOSIS — I350 Nonrheumatic aortic (valve) stenosis: Secondary | ICD-10-CM | POA: Insufficient documentation

## 2023-12-08 DIAGNOSIS — Z79899 Other long term (current) drug therapy: Secondary | ICD-10-CM | POA: Insufficient documentation

## 2023-12-08 DIAGNOSIS — R1031 Right lower quadrant pain: Secondary | ICD-10-CM | POA: Insufficient documentation

## 2023-12-08 DIAGNOSIS — I25119 Atherosclerotic heart disease of native coronary artery with unspecified angina pectoris: Secondary | ICD-10-CM | POA: Diagnosis not present

## 2023-12-08 DIAGNOSIS — I10 Essential (primary) hypertension: Secondary | ICD-10-CM | POA: Insufficient documentation

## 2023-12-08 MED ORDER — SPIRONOLACTONE 25 MG PO TABS
12.5000 mg | ORAL_TABLET | Freq: Every day | ORAL | 3 refills | Status: AC
  Filled 2023-12-08: qty 45, 90d supply, fill #0

## 2023-12-08 MED ORDER — DAPAGLIFLOZIN PROPANEDIOL 10 MG PO TABS
10.0000 mg | ORAL_TABLET | Freq: Every day | ORAL | 3 refills | Status: AC
  Filled 2023-12-08: qty 90, 90d supply, fill #0

## 2023-12-08 MED ORDER — ATORVASTATIN CALCIUM 80 MG PO TABS
80.0000 mg | ORAL_TABLET | Freq: Every day | ORAL | 3 refills | Status: AC
  Filled 2023-12-08 – 2024-02-15 (×2): qty 90, 90d supply, fill #0

## 2023-12-08 MED ORDER — EZETIMIBE 10 MG PO TABS
10.0000 mg | ORAL_TABLET | Freq: Every day | ORAL | 3 refills | Status: AC
  Filled 2023-12-08 – 2023-12-20 (×2): qty 90, 90d supply, fill #0

## 2023-12-08 NOTE — Patient Instructions (Signed)
 Medication Instructions:  Your physician recommends that you continue on your current medications as directed. Please refer to the Current Medication list given to you today.  *If you need a refill on your cardiac medications before your next appointment, please call your pharmacy*  Lab Work: Your physician recommends that you return for lab work in: BMET   If you have labs (blood work) drawn today and your tests are completely normal, you will receive your results only by: MyChart Message (if you have MyChart) OR A paper copy in the mail If you have any lab test that is abnormal or we need to change your treatment, we will call you to review the results.  Testing/Procedures: NONE   Follow-Up: At [facility], you and your health needs are our priority.  As part of our continuing mission to provide you with exceptional heart care, our providers are all part of one team.  This team includes your primary Cardiologist (physician) and Advanced Practice Providers or APPs (Physician Assistants and Nurse Practitioners) who all work together to provide you with the care you need, when you need it.  Your next appointment:   3 -4 month(s)  Provider:   You may see Jayson Sierras, MD or one of the following Advanced Practice Providers on your designated Care Team:   Laymon Qua, PA-C  [place], [place] Olivia Pavy, [place]     We recommend signing up for the patient portal called MyChart.  Sign up information is provided on this After Visit Summary.  MyChart is used to connect with patients for Virtual Visits (Telemedicine).  Patients are able to view lab/test results, encounter notes, upcoming appointments, etc.  Non-urgent messages can be sent to your provider as well.   To learn more about what you can do with MyChart, go to [email or web address].   Other Instructions Thank you for choosing [facility]!

## 2023-12-08 NOTE — Progress Notes (Unsigned)
 Cardiology Office Note    Date:  12/09/2023  ID:  ROBIN PAFFORD, DOB 1940/03/02, MRN [id number] Cardiologist: Jayson Sierras, MD { :  History of Present Illness:    Sean Villarreal is a 83 y.o. 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   He was examined by Dr. Sierras in 09/2023 following a recent admission at [facility] for an NSTEMI and cardiac catheterization had shown multivessel disease with the culprit lesion felt to be his mid RCA and he received DES placement to this. EF was at 45 to 50% by echocardiogram. He was continued on ASA, Plavix , Lipitor  and Zetia with Imdur  being changed to 30 mg once daily. Was continued on Farxiga , Aldactone  and Entresto  for GDMT but Lopressor  was discontinued and he was switched to Coreg  25 mg twice daily and Lasix  reduced to 40 mg daily.  In the interim, he was admitted to [facility] from 10/24 - 11/22/2023 for a recurrent NSTEMI and troponin values elevated up to 1069. Repeat echocardiogram showed his EF was at 50 to 55% but he was noted to have moderate to severe aortic valve stenosis. Underwent cardiac catheterization which showed chronic occlusion of the proximal LAD but patent LIMA to LAD and chronic occlusion of the LCx with patent vein graft to the OM.  RCA was patent with no evidence of ISR. He did have severe low-flow/low-gradient AS and it was recommended to proceed with outpatient workup for TAVR.  CT abdomen showed known pancreatic cyst as noted on prior imaging with repeat CT or MRI recommended in 6  months. There was no occlusion or hemodynamically significant stenosis of the arterial system of the chest, abdomen or pelvis  He is scheduled to meet with CT surgery on 12/15/2023.  In talking with the patient today, he reports his biggest issue since hospital discharge has been pain along his right inguinal region. He did undergo ultrasound imaging which showed no evidence of a pseudoaneurysm but was noted to have an avascular, hypoechoic structure which could represent hernia vs. enlarged lymph node vs. fat. He has a history of a left inguinal hernia and underwent repair by Dr. Kallie in 2022 and was referred back to her but the visit was canceled given his recent NSTEMI  He reports having some shortness of breath but no recent chest pain, palpitations, orthopnea, PND or pitting edema.  He has been keeping check of his blood pressure at home and SBP has been in the low 100's. Entresto  was stopped during a prior admission given hypotension.   Studies Reviewed:   EKG: EKG is not ordered today.  Echocardiogram: 10/2023 IMPRESSIONS     1. Left ventricular ejection fraction, by estimation, is 50 to 55%. The  left ventricle has low normal function. The left ventricle demonstrates  global hypokinesis. There is mild concentric left ventricular hypertrophy.  Left ventricular diastolic  parameters are consistent with Grade I diastolic dysfunction (impaired  relaxation).   2. Right ventricular systolic function is normal. The right ventricular  size is normal.  There is normal pulmonary artery systolic pressure.   3. The mitral valve is degenerative. Mild mitral valve regurgitation. No  evidence of mitral stenosis. Moderate mitral annular calcification.   4. The aortic valve is calcified. There is mild calcification of the  aortic valve. There is mild thickening of the aortic valve. Aortic valve  regurgitation is not visualized. Moderate to severe aortic valve stenosis.  Aortic valve area, by VTI  measures  0.86 cm. Aortic valve mean gradient measures 18.0 mmHg. Aortic valve Vmax  measures 2.74 m/s.   5. The inferior vena cava is normal in size with greater than 50%  respiratory variability, suggesting right atrial pressure of 3 mmHg.    Cardiac Catheterization: 10/2023   Ost Cx to Prox Cx lesion is 100% stenosed.   Ost LAD to Prox LAD lesion is 99% stenosed.   Mid LM to Ost LAD lesion is 99% stenosed.   Mid LAD lesion is 100% stenosed.   Previously placed Prox RCA to Mid RCA stent of unknown type is  widely patent.   LIMA graft was visualized by angiography and is normal in caliber.   SVG graft was visualized by angiography and is normal in caliber.   Chronic occlusion proximal LAD. Patent LIMA to mid LAD filling the mid and distal LAD Chronic occlusion ostial Circumflex. Patent vein graft to obtuse marginal branch.  Large dominant RCA with patent mid stented segment with no restenosis.  Normal LVEDP Severe low flow/low gradient aortic stenosis by echo.    Recommendations: I suspect that his symptoms are related to his aortic stenosis. Will monitor overnight. Will plan outpatient pre-TAVR CT scans and visit with the CT surgeon and continue down pathway for TAVR.     Physical Exam:   VS:  BP 110/64 (BP Location: Left Arm, Cuff Size: Normal)   Pulse 84   Ht 5' 9 (1.753 m)   Wt 178 lb 3.2 oz (80.8 kg)   SpO2 100%   BMI 26.32 kg/m    Wt Readings from Last 3 Encounters:  12/08/23 178 lb 3.2 oz (80.8 kg)  11/19/23 187 lb 6.3 oz (85 kg)  10/12/23 192 lb 9.6 oz (87.4 kg)     GEN: Pleasant, elderly male appearing in no acute distress NECK: No JVD; No carotid bruits CARDIAC: RRR, 2/6 SEM along RUSB.  RESPIRATORY:  Clear to auscultation without rales, wheezing or rhonchi  ABDOMEN: Appears non-distended. No obvious abdominal masses. EXTREMITIES: No clubbing or cyanosis. No pitting edema.  Distal pedal pulses are 2+ bilaterally.  Groin site without ecchymosis or evidence of  a hematoma.   Assessment and Plan:   1. Coronary artery disease involving native coronary artery of native heart with angina pectoris - He previously underwent CABG in 2021 with details as outlined above and did have DES placement to the RCA in 09/2023.  Recent cardiac catheterization last month showed patent bypass grafts and patent stent and symptoms were felt to be due to his aortic stenosis.  - He has baseline dyspnea on exertion but no recent chest pain. - Continue current medical therapy with ASA 81 mg daily, Plavix  75 mg daily, Atorvastatin  80 mg daily, Zetia 10mg  daily and Coreg  12.5 mg twice daily.  2. Heart failure with mildly reduced ejection fraction (HFmrEF) (HCC) - His EF was at 45% by echocardiogram in 09/2023 and improved to 50 to 55% by repeat imaging last month. He appears euvolemic by examination today. He is no longer on Entresto  given soft BP. Continue  current GDMT for now with Coreg  12.5 mg twice daily, Farxiga  10 mg daily, Lasix  40 mg daily and Spironolactone  12.5 mg daily.  3. Nonrheumatic aortic valve stenosis - He has severe low-flow/low-gradient AS and is undergoing workup by the Structural Heart Team for TAVR. Has follow-up with CT Surgery scheduled for later this month.   4. Essential hypertension - Blood pressure is well-controlled at 110/64 during today's visit. Continue current medical therapy with Coreg  12.5 mg twice daily, Lasix  40 mg daily and Spironolactone  12.5 mg daily.  5. Dyslipidemia - FLP in 10/2023 showed total cholesterol 111, triglycerides 50, HDL 55 and LDL 46. Continue Atorvastatin  80 mg daily and Zetia 10 mg daily.  6. Right Groin Pain - This has been occurring since earlier this month and recent imaging was negative for a pseudoaneurysm but showed a hypoechoic structure which could represent hernia vs. lymph node vs. fat. His appointment with Surgery was canceled since he would not be a surgical candidate given recent NSTEMI. I will reach out to  them to see if there are any conservative measures they recommend such as a supportive belt.  Signed, Laymon CHRISTELLA Qua, PA-C

## 2023-12-09 ENCOUNTER — Encounter: Payer: Self-pay | Admitting: Student

## 2023-12-12 NOTE — Telephone Encounter (Signed)
 RE: Inguinal Hernia Received: Today Kallie Manuelita BROCKS, MD  Johnson Laymon HERO, PA-C; Iliyana Convey, Tawni DEL, LPN He has a CT from 11/5 that does not appear to have any right sided hernia on that side. He does have a hip replacement and scatter but there is definitely no bowel in the right side or any obvious hernia.  There appears to be some fat stranding but nothing crazy, they do not note any lymph nodes.  I think the pain is form the heart catheterization (he was a right groin stick) and not from a hernia. That should get better. He probably had some bruising/ bleeding.  As far as an appt, the office canceled him since he just had the heart catheterization. I am not sure based on the CT scan if there is really any indication for any hernia belt or measures outside of heat/ rest/ tylenol . I think he should follow up with his PCP and this should resolve with time.  Manuelita       Previous Messages    ----- Message ----- From: Johnson Laymon HERO, PA-C Sent: 12/09/2023  12:24 PM EST To: Manuelita BROCKS Kallie, MD Subject: Inguinal Hernia                                Hi Dr. Kallie,  I saw this mutual patient yesterday and his biggest concern has been worsening pain along his right groin area. He underwent ultrasound imaging earlier this month which showed a hypoechoic structure measuring 4.3 x 1.1 x 3.2 cm in the lateral right symphsis pubis fossa which could be due to hernia versus lymph node versus fat. His PCP had referred him back to you but it appears the appointment was canceled since he would not be a surgical candidate at this time given recent NSTEMI and stent placement.  My question is, are there any conservative measures you would recommend in the interim until he is at least 6 months out from stent placement? Do supportive belts help? If so, is that something he typically needs an order for or can purchase from [employer]?  Thanks for your help!  Best, Brittany

## 2023-12-15 ENCOUNTER — Ambulatory Visit

## 2023-12-15 VITALS — BP 132/74 | HR 83 | Resp 20 | Ht 69.0 in | Wt 178.0 lb

## 2023-12-15 DIAGNOSIS — I350 Nonrheumatic aortic (valve) stenosis: Secondary | ICD-10-CM | POA: Insufficient documentation

## 2023-12-15 NOTE — H&P (View-Only) (Signed)
 [facility]   MULTIDISCIPLINARY HEART VALVE CLINIC    HALEEM HANNER [facility] Medical Record #[id number] Date of Birth: [birth year]  Referring: Verlin Lonni BIRCH* Primary Care: Vida Mardy DEL, PA-C Primary Cardiologist:Samuel Debera, MD  Chief Complaint:    Chief Complaint  Patient presents with   Aortic Stenosis    TAVR consult/ review all testing    History of Present Illness:     DOMINYCK RESER is a 83 y.o. male presents for evaluation of severe aortic stenosis and TAVR.  He also has a history of CAD and is s/p CABG x 2 (SVG - OM, LIMA - LAD in 2021 with Dr. Kerrin - all grafts open) and recently DES to RCA in September after NSTEMI.  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  He was then admitted at the end of October with recurrent NSTEMI without evidence of new occlusion on LHC but concerns for LFLG aortic stenosis.    His main concern currently is right groin pain which is significantly limiting his activty.  This pain started after his last LHC, and ultrasound did not demonstrate a psudeoaneurysm.  He reports it feels better when he lays down.  He has ordered a hernia belt that should be arriving in the next day or so.  He was supposed to see general surgery but they canceled his appointment until his cardiac issues are resolved.  Prior to the groin pain, he would mow his yard (2.5 acres), works in the garage and did a lot of walking.  He was having some chest pain and dyspnea with exertion.  Denies syncope or dizziness.  He also reports about 25 lb unintentional weight loss in the last 3-4 months, but his weight has been stable the last month.  He has no teeth, full dentures.  My measurements: Annulus: Area 508, Peri 82.5, Dia 25.4 SOV: 30.3-33.3 RCA: 13.8 LCA: 17.1 Both transfemoral arteries seem adequate, see access concerns below.   Past Medical History:  Diagnosis Date   Aortic stenosis    a. 03/2022 Echo: EF 60-65%, no rwma, mild  LVH, nl RV fxn, mild-mod AS.   Arthritis    Basal cell carcinoma    Coronary artery disease    a. DES to LAD and LCx April 2014 b. PTCA ostial circumflex 03/2016 due to ISR c.  9/18 PCI/DESx2 osital Lcx (ISR), p/mLCx  d. s/p CABG on 01/24/2020 with LIMA-LAD and SVG-OM.   Essential hypertension    GERD (gastroesophageal reflux disease)    Hematuria    History of blood transfusion 09/2012   History of kidney stones    Hyperlipidemia LDL goal <70    NSTEMI (non-ST elevated myocardial infarction) (HCC) 08/2012   Post-op Afib (HCC)    a. 12/2019-01/2020 following CABG. Briefly on amio.   Type II diabetes mellitus (HCC)     Past Surgical History:  Procedure Laterality Date   BASAL CELL CARCINOMA EXCISION     right cheek; both shoulders   CARDIAC CATHETERIZATION     CATARACT EXTRACTION W/ INTRAOCULAR LENS  IMPLANT, BILATERAL Bilateral    COLONOSCOPY N/A 03/19/2016   Procedure: COLONOSCOPY;  Surgeon: Claudis RAYMOND Rivet, MD;  Location: [facility];  Service: Endoscopy;  Laterality: N/A;  9:15   CORONARY ARTERY BYPASS GRAFT N/A 01/24/2020   Procedure: CORONARY ARTERY BYPASS GRAFTING (CABG), ON PUMP, TIMES TWO, USING LEFT INTERNAL MAMMARY ARTERY AND ENDOSCOPICALLY HARVESTED RIGHT GREATER SAPHENOUS VEIN;  Surgeon: Kerrin Elspeth BROCKS, MD;  Location:  [facility] OR;  Service: Open Heart Surgery;  Laterality: N/A;   CORONARY BALLOON ANGIOPLASTY N/A 04/15/2016   Procedure: Coronary Balloon Angioplasty;  Surgeon: Peter M Jordan, MD;  Location: [facility] INVASIVE CV LAB;  Service: Cardiovascular;  Laterality: N/A;   CORONARY STENT INTERVENTION N/A 10/11/2016   Procedure: CORONARY STENT INTERVENTION;  Surgeon: Dann Candyce RAMAN, MD;  Location: [facility] INVASIVE CV LAB;  Service: Cardiovascular;  Laterality: N/A;   CYSTOSCOPY N/A 12/30/2022   Procedure: CYSTOSCOPY;  Surgeon: Sherrilee Belvie CROME, MD;  Location: [facility] ORS;  Service: Urology;  Laterality: N/A;   CYSTOSCOPY W/ URETEROSCOPY W/ LITHOTRIPSY  10/2012   /notes  11/10/2012   CYSTOSCOPY WITH STENT PLACEMENT  08/2012; 09/2012   EYE SURGERY Left ~ 02/2016   for crinkled lens   INGUINAL HERNIA REPAIR Left 12/24/2020   Procedure: HERNIA REPAIR INGUINAL WITH MESH;  Surgeon: Kallie Manuelita BROCKS, MD;  Location: [facility] ORS;  Service: General;  Laterality: Left;   LEFT HEART CATH AND CORONARY ANGIOGRAPHY N/A 04/15/2016   Procedure: Left Heart Cath and Coronary Angiography;  Surgeon: Peter M Jordan, MD;  Location: [facility] INVASIVE CV LAB;  Service: Cardiovascular;  Laterality: N/A;   LEFT HEART CATH AND CORONARY ANGIOGRAPHY N/A 10/11/2016   Procedure: LEFT HEART CATH AND CORONARY ANGIOGRAPHY;  Surgeon: Dann Candyce RAMAN, MD;  Location: [facility] INVASIVE CV LAB;  Service: Cardiovascular;  Laterality: N/A;   LEFT HEART CATH AND CORONARY ANGIOGRAPHY N/A 03/31/2018   Procedure: LEFT HEART CATH AND CORONARY ANGIOGRAPHY;  Surgeon: Burnard Debby LABOR, MD;  Location: [facility] INVASIVE CV LAB;  Service: Cardiovascular;  Laterality: N/A;   LEFT HEART CATH AND CORONARY ANGIOGRAPHY N/A 01/21/2020   Procedure: LEFT HEART CATH AND CORONARY ANGIOGRAPHY;  Surgeon: Claudene Victory ORN, MD;  Location: [facility] INVASIVE CV LAB;  Service: Cardiovascular;  Laterality: N/A;   LEFT HEART CATH AND CORONARY ANGIOGRAPHY N/A 11/21/2023   Procedure: LEFT HEART CATH AND CORONARY ANGIOGRAPHY;  Surgeon: Verlin Lonni BIRCH, MD;  Location: [facility] INVASIVE CV LAB;  Service: Cardiovascular;  Laterality: N/A;   LEFT HEART CATHETERIZATION WITH CORONARY ANGIOGRAM N/A 05/05/2012   Procedure: LEFT HEART CATHETERIZATION WITH CORONARY ANGIOGRAM;  Surgeon: Lonni BIRCH Verlin, MD;  Location: [facility] CATH LAB;  Service: Cardiovascular;  Laterality: N/A;   LEFT HEART CATHETERIZATION WITH CORONARY ANGIOGRAM N/A 05/15/2012   Procedure: LEFT HEART CATHETERIZATION WITH CORONARY ANGIOGRAM;  Surgeon: Lonni BIRCH Verlin, MD;  Location: [facility] CATH LAB;  Service: Cardiovascular;  Laterality: N/A;   TEE WITHOUT CARDIOVERSION N/A 01/24/2020   Procedure:  TRANSESOPHAGEAL ECHOCARDIOGRAM (TEE);  Surgeon: Kerrin Elspeth BROCKS, MD;  Location: [facility] OR;  Service: Open Heart Surgery;  Laterality: N/A;   TONSILLECTOMY  1948   TRANSURETHRAL RESECTION OF PROSTATE N/A 12/30/2022   Procedure: TRANSURETHRAL RESECTION OF THE PROSTATE (TURP);  Surgeon: Sherrilee Belvie CROME, MD;  Location: [facility]S;  Service: Urology;  Laterality: N/A;    Social History:  Social History   Tobacco Use  Smoking Status Former   Current packs/day: 0.00   Average packs/day: 0.8 packs/day for 30.0 years (22.5 ttl pk-yrs)   Types: Cigarettes   Start date: 01/25/1982   Quit date: 11/05/1988   Years since quitting: 35.1  Smokeless Tobacco Never    Social History   Substance and Sexual Activity  Alcohol  Use Not Currently   Alcohol /week: 0.0 standard drinks of alcohol      Allergies  Allergen Reactions   Contrast Media [Iodinated Contrast Media] Hives and Other (See Comments)    Had welts on arm, and kidney  issues after given dye in the past   Jardiance  [Empagliflozin ] Diarrhea   Penicillins Other (See Comments)    Passed out as a child       Current Outpatient Medications  Medication Sig Dispense Refill   acetaminophen  (TYLENOL ) 500 MG tablet Take 1,000 mg by mouth every 6 (six) hours as needed for mild pain (pain score 1-3).     aspirin  EC 81 MG tablet Take 81 mg by mouth in the morning. Swallow whole.     atorvastatin  (LIPITOR ) 80 MG tablet Take 1 tablet (80 mg total) by mouth daily. 90 tablet 3   carvedilol  (COREG ) 12.5 MG tablet Take 1 tablet (12.5 mg total) by mouth 2 (two) times daily. 180 tablet 2   clopidogrel  (PLAVIX ) 75 MG tablet Take 1 tablet (75 mg total) by mouth daily. 90 tablet 3   dapagliflozin  propanediol (FARXIGA ) 10 MG TABS tablet Take 1 tablet (10 mg total) by mouth daily before breakfast. 90 tablet 3   Dulaglutide (TRULICITY) 0.75 MG/0.5ML SOAJ Inject 0.75 mg as directed once a week. 3 mL 3   ezetimibe (ZETIA) 10 MG tablet Take 1 tablet (10 mg  total) by mouth at bedtime. 90 tablet 3   fluticasone  (FLONASE ) 50 MCG/ACT nasal spray Place 1 spray into both nostrils daily as needed for allergies or rhinitis.     furosemide  (LASIX ) 40 MG tablet Take 1 tablet (40 mg total) by mouth daily. 90 tablet 3   glimepiride  (AMARYL ) 1 MG tablet Take 1 tablet (1 mg total) by mouth daily. 90 tablet 3   ketoconazole  (NIZORAL ) 2 % cream Apply topically to affected area twice daily. 60 g 6   latanoprost  (XALATAN ) 0.005 % ophthalmic solution Place 1 drop into the left eye at bedtime.     Magnesium  400 MG TABS Take 1 tablet by mouth daily. 90 tablet 3   metFORMIN  (GLUCOPHAGE ) 1000 MG tablet Take 1 tablet (1,000 mg total) by mouth 2 (two) times daily. 180 tablet 3   nitroGLYCERIN  (NITROSTAT ) 0.4 MG SL tablet Place 0.4 mg under the tongue every 5 (five) minutes x 3 doses as needed for chest pain.     potassium chloride  SA (KLOR-CON  M) 20 MEQ tablet Take 1 tablet (20 mEq total) by mouth daily. 90 tablet 1   SIMBRINZA 1-0.2 % SUSP Place 1 drop into the left eye 2 (two) times daily.     spironolactone  (ALDACTONE ) 25 MG tablet Take 0.5 tablets (12.5 mg total) by mouth daily. 45 tablet 3   tamsulosin  (FLOMAX ) 0.4 MG CAPS capsule Take 1 capsule (0.4 mg total) by mouth 2 (two) times daily. 180 capsule 3   No current facility-administered medications for this visit.    (Not in a hospital admission)   Family History  Problem Relation Age of Onset   Heart disease Mother    Heart failure Mother    Heart disease Father    Heart attack Father    Colon cancer Neg Hx      Review of Systems:   Review of Systems  Constitutional:  Positive for malaise/fatigue and weight loss.  Respiratory:  Positive for shortness of breath.   Cardiovascular:  Positive for chest pain. Negative for palpitations and leg swelling.  Gastrointestinal:  Negative for nausea and vomiting.  Neurological:  Negative for dizziness and headaches.      Physical Exam: BP 132/74   Pulse 83    Resp 20   Ht 5' 9 (1.753 m)   Wt 178 lb (80.7  kg)   SpO2 95% Comment: RA  BMI 26.29 kg/m  Physical Exam Constitutional:      Appearance: Normal appearance.  HENT:     Head: Normocephalic and atraumatic.  Cardiovascular:     Rate and Rhythm: Normal rate.     Heart sounds: Murmur heard.  Pulmonary:     Effort: Pulmonary effort is normal.     Breath sounds: Normal breath sounds.  Abdominal:     Palpations: Abdomen is soft.     Comments: Bulge in the right groin that is soft but tender.  Appears to be an inguinal hernia on exam.  Musculoskeletal:        General: No swelling.  Skin:    General: Skin is warm and dry.  Neurological:     General: No focal deficit present.     Mental Status: He is alert and oriented to person, place, and time.       Cardiac Studies & Procedures   ______________________________________________________________________________________________ CARDIAC CATHETERIZATION  CARDIAC CATHETERIZATION 11/21/2023  Conclusion   Ost Cx to Prox Cx lesion is 100% stenosed.   Ost LAD to Prox LAD lesion is 99% stenosed.   Mid LM to Ost LAD lesion is 99% stenosed.   Mid LAD lesion is 100% stenosed.   Previously placed Prox RCA to Mid RCA stent of unknown type is  widely patent.   LIMA graft was visualized by angiography and is normal in caliber.   SVG graft was visualized by angiography and is normal in caliber.  Chronic occlusion proximal LAD. Patent LIMA to mid LAD filling the mid and distal LAD Chronic occlusion ostial Circumflex. Patent vein graft to obtuse marginal branch. Large dominant RCA with patent mid stented segment with no restenosis. Normal LVEDP Severe low flow/low gradient aortic stenosis by echo.  Recommendations: I suspect that his symptoms are related to his aortic stenosis. Will monitor overnight. Will plan outpatient pre-TAVR CT scans and visit with the CT surgeon and continue down pathway for TAVR.  Findings Coronary  Findings Diagnostic  Dominance: Right  Left Main Mid LM to Ost LAD lesion is 99% stenosed.  Left Anterior Descending Ost LAD to Prox LAD lesion is 99% stenosed. Mid LAD lesion is 100% stenosed. The lesion is chronically occluded.  Left Circumflex Ost Cx to Prox Cx lesion is 100% stenosed. The lesion is chronically occluded.  Right Coronary Artery Vessel is large. Previously placed Prox RCA to Mid RCA stent of unknown type is  widely patent.  LIMA LIMA Graft To Dist LAD LIMA graft was visualized by angiography and is normal in caliber.  Saphenous Graft To 2nd Mrg SVG graft was visualized by angiography and is normal in caliber.  Intervention  No interventions have been documented.   CARDIAC CATHETERIZATION  CARDIAC CATHETERIZATION 01/21/2020  Conclusion  Severe calcific coronary disease involving the left anterior descending, circumflex, and left main.  40 to 50% eccentric distal left main  Bulky eccentric 80% ostial to proximal LAD showing progression compared to 2020.  Moderate mid to distal diffuse LAD disease.  Diffuse ostial to proximal in-stent restenosis in circumflex which has 2 layers of stent.  Moderate mid to distal circumflex 50% narrowing.  Occlusion of ramus intermedius, very small and fills by left to left collaterals.  Dominant right coronary with luminal irregularities but no high-grade obstruction.  Normal anterior wall motion.  Estimated > EF 50%.  Inferior wall not well seen.  LVEDP is normal.  RECOMMENDATIONS:   Discontinue Plavix   IV heparin   Recommend two-vessel coronary bypass surgery in the setting of significant proximal LAD disease and in-stent restenosis x3 of the ostial circumflex (2 layers of stent).  2D Doppler echocardiogram to fully assess LV function.  Findings Coronary Findings Diagnostic  Dominance: Right  Left Main Mid LM to Ost LAD lesion is 50% stenosed.  Left Anterior Descending There is moderate diffuse disease  throughout the vessel. Ost LAD lesion is 80% stenosed. Prox LAD lesion is 40% stenosed. The lesion was previously treated. Mid LAD lesion is 75% stenosed.  First Diagonal Branch Vessel is small in size.  Second Diagonal Branch Vessel is large in size. Ost 2nd Diag lesion is 40% stenosed.  Ramus Intermedius Collaterals Ramus filled by collaterals from 2nd Sept.  Ramus lesion is 100% stenosed.  Left Circumflex There is moderate diffuse disease throughout the vessel. Ost Cx to Prox Cx lesion is 75% stenosed. The lesion was previously treated. Prox Cx to Mid Cx lesion is 40% stenosed. The lesion was previously treated. Mid Cx lesion is 40% stenosed.  Right Coronary Artery The vessel exhibits minimal luminal irregularities. Prox RCA to Mid RCA lesion is 20% stenosed.  Intervention  No interventions have been documented.   STRESS TESTS  NM MYOCAR MULTI W/SPECT W 04/01/2022  Narrative   The study is normal. There are no perfusion defects  The study is low risk.   No ST deviation was noted.   LV perfusion is normal.   Left ventricular function is normal. Nuclear stress EF: 67 %. The left ventricular ejection fraction is hyperdynamic (>65%). End diastolic cavity size is normal.   ECHOCARDIOGRAM  ECHOCARDIOGRAM COMPLETE 11/19/2023  Narrative ECHOCARDIOGRAM REPORT    Patient Name:   WIRT HEMMERICH Date of Exam: 11/19/2023 Medical Rec #:  [id number]         Height:       69.0 in Accession #:    [id number]        Weight:       187.4 lb Date of Birth:  06-Aug-1940          BSA:          2.010 m Patient Age:    83 years          BP:           114/72 mmHg Patient Gender: M                 HR:           78 bpm. Exam Location:  Inpatient  Procedure: 2D Echo (Both Spectral and Color Flow Doppler were utilized during procedure).  Indications:    NSTEMI  History:        Patient has prior history of Echocardiogram examinations, most recent 04/10/2023. CAD; Risk  Factors:Hypertension, Dyslipidemia and Diabetes.  Sonographer:    Tinnie Barefoot RDCS Referring Phys: [id number] [address] ELGERGAWY  IMPRESSIONS   1. Left ventricular ejection fraction, by estimation, is 50 to 55%. The left ventricle has low normal function. The left ventricle demonstrates global hypokinesis. There is mild concentric left ventricular hypertrophy. Left ventricular diastolic parameters are consistent with Grade I diastolic dysfunction (impaired relaxation). 2. Right ventricular systolic function is normal. The right ventricular size is normal. There is normal pulmonary artery systolic pressure. 3. The mitral valve is degenerative. Mild mitral valve regurgitation. No evidence of mitral stenosis. Moderate mitral annular calcification. 4. The aortic valve is calcified. There is mild calcification of the aortic valve. There is mild thickening of the  aortic valve. Aortic valve regurgitation is not visualized. Moderate to severe aortic valve stenosis. Aortic valve area, by VTI measures 0.86 cm. Aortic valve mean gradient measures 18.0 mmHg. Aortic valve Vmax measures 2.74 m/s. 5. The inferior vena cava is normal in size with greater than 50% respiratory variability, suggesting right atrial pressure of 3 mmHg.  FINDINGS Left Ventricle: Left ventricular ejection fraction, by estimation, is 50 to 55%. The left ventricle has low normal function. The left ventricle demonstrates global hypokinesis. The left ventricular internal cavity size was normal in size. There is mild concentric left ventricular hypertrophy. Left ventricular diastolic parameters are consistent with Grade I diastolic dysfunction (impaired relaxation).  Right Ventricle: The right ventricular size is normal. No increase in right ventricular wall thickness. Right ventricular systolic function is normal. There is normal pulmonary artery systolic pressure. The tricuspid regurgitant velocity is 2.20 m/s, and with an assumed  right atrial pressure of 3 mmHg, the estimated right ventricular systolic pressure is 22.4 mmHg.  Left Atrium: Left atrial size was normal in size.  Right Atrium: Right atrial size was normal in size.  Pericardium: There is no evidence of pericardial effusion.  Mitral Valve: The mitral valve is degenerative in appearance. Moderate mitral annular calcification. Mild mitral valve regurgitation. No evidence of mitral valve stenosis. MV peak gradient, 5.4 mmHg. The mean mitral valve gradient is 3.0 mmHg.  Tricuspid Valve: The tricuspid valve is normal in structure. Tricuspid valve regurgitation is not demonstrated. No evidence of tricuspid stenosis.  Aortic Valve: The aortic valve is calcified. There is mild calcification of the aortic valve. There is mild thickening of the aortic valve. Aortic valve regurgitation is not visualized. Moderate to severe aortic stenosis is present. Aortic valve mean gradient measures 18.0 mmHg. Aortic valve peak gradient measures 30.0 mmHg. Aortic valve area, by VTI measures 0.86 cm.  Pulmonic Valve: The pulmonic valve was normal in structure. Pulmonic valve regurgitation is mild. No evidence of pulmonic stenosis.  Aorta: The aortic root is normal in size and structure.  Venous: The inferior vena cava is normal in size with greater than 50% respiratory variability, suggesting right atrial pressure of 3 mmHg.  IAS/Shunts: No atrial level shunt detected by color flow Doppler.   LEFT VENTRICLE PLAX 2D LVIDd:         5.20 cm     Diastology LVIDs:         4.00 cm     LV e' medial:    4.46 cm/s LV PW:         1.00 cm     LV E/e' medial:  18.5 LV IVS:        1.00 cm     LV e' lateral:   7.07 cm/s LVOT diam:     2.00 cm     LV E/e' lateral: 11.7 LV SV:         52 LV SV Index:   26 LVOT Area:     3.14 cm  LV Volumes (MOD) LV vol d, MOD A4C: 83.0 ml LV vol s, MOD A4C: 53.8 ml LV SV MOD A4C:     83.0 ml  RIGHT VENTRICLE            IVC RV Basal diam:  3.10 cm     IVC diam: 2.00 cm RV S prime:     6.96 cm/s TAPSE (M-mode): 1.0 cm  LEFT ATRIUM             Index  RIGHT ATRIUM           Index LA diam:        4.70 cm 2.34 cm/m   RA Area:     14.20 cm LA Vol (A2C):   56.9 ml 28.31 ml/m  RA Volume:   29.90 ml  14.88 ml/m LA Vol (A4C):   65.6 ml 32.64 ml/m LA Biplane Vol: 61.7 ml 30.70 ml/m AORTIC VALVE AV Area (Vmax):    0.90 cm AV Area (Vmean):   0.86 cm AV Area (VTI):     0.86 cm AV Vmax:           274.00 cm/s AV Vmean:          198.000 cm/s AV VTI:            0.602 m AV Peak Grad:      30.0 mmHg AV Mean Grad:      18.0 mmHg LVOT Vmax:         78.50 cm/s LVOT Vmean:        54.450 cm/s LVOT VTI:          0.165 m LVOT/AV VTI ratio: 0.27  AORTA Ao Root diam: 3.30 cm Ao Asc diam:  3.30 cm  MITRAL VALVE                TRICUSPID VALVE MV Area (PHT): 3.31 cm     TR Peak grad:   19.4 mmHg MV Area VTI:   1.72 cm     TR Vmax:        220.00 cm/s MV Peak grad:  5.4 mmHg MV Mean grad:  3.0 mmHg     SHUNTS MV Vmax:       1.16 m/s     Systemic VTI:  0.16 m MV Vmean:      80.3 cm/s    Systemic Diam: 2.00 cm MV Decel Time: 229 msec MV E velocity: 82.50 cm/s MV A velocity: 118.00 cm/s MV E/A ratio:  0.70  Kardie Tobb DO Electronically signed by Dub Huntsman DO Signature Date/Time: 11/19/2023/4:31:03 PM    Final   TEE  ECHO INTRAOPERATIVE TEE 01/24/2020  Narrative *INTRAOPERATIVE TRANSESOPHAGEAL REPORT *    Patient Name:   JOHNATON SONNEBORN Date of Exam: 01/24/2020 Medical Rec #:  [id number]         Height:       68.0 in Accession #:    [id number]        Weight:       204.8 lb Date of Birth:  Aug 01, 1940          BSA:          2.06 m Patient Age:    79 years          BP:           138/79 mmHg Patient Gender: M                 HR:           71 bpm. Exam Location:  Inpatient  Transesophogeal exam was perform intraoperatively during surgical procedure. Patient was closely monitored under general anesthesia during the  entirety of examination.  Indications:     CABG Performing Phys: [id number] ELSPETH BROCKS HENDRICKSON Diagnosing Phys: Norleen Pope MD  Complications: No known complications during this procedure. POST-OP IMPRESSIONS - Left Ventricle: The left ventricle is unchanged from pre-bypass. - Right Ventricle: The right ventricle appears unchanged from pre-bypass. - Aorta: The aorta appears unchanged from  pre-bypass. - Left Atrium: The left atrium appears unchanged from pre-bypass. - Left Atrial Appendage: The left atrial appendage appears unchanged from pre-bypass. - Aortic Valve: The aortic valve appears unchanged from pre-bypass. - Mitral Valve: The mitral valve appears unchanged from pre-bypass. - Tricuspid Valve: The tricuspid valve appears unchanged from pre-bypass. - Interatrial Septum: The interatrial septum appears unchanged from pre-bypass. - Interventricular Septum: The interventricular septum appears unchanged from pre-bypass. - Pericardium: The pericardium appears unchanged from pre-bypass.  PRE-OP FINDINGS Left Ventricle: The left ventricle has normal systolic function, with an ejection fraction of 55-60%. The cavity size was normal. There is moderate concentric left ventricular hypertrophy.  Right Ventricle: The right ventricle has normal systolic function. The cavity was mildly enlarged. There is no increase in right ventricular wall thickness.  Left Atrium: Left atrial size was normal in size. The left atrial appendage is well visualized and there is no evidence of thrombus present.  Right Atrium: Right atrial size was normal in size.  Interatrial Septum: No atrial level shunt detected by color flow Doppler.  Pericardium: There is no evidence of pericardial effusion.  Mitral Valve: The mitral valve is normal in structure. Mild thickening of the mitral valve leaflet. Mild calcification of the mitral valve leaflet. Mitral valve regurgitation is mild by color flow Doppler. There is no  evidence of mitral valve vegetation.  Tricuspid Valve: The tricuspid valve was normal in structure. Tricuspid valve regurgitation is mild by color flow Doppler. There is no evidence of tricuspid valve vegetation.  Aortic Valve: The aortic valve is tricuspid Aortic valve regurgitation is mild by color flow Doppler. There is mild stenosis of the aortic valve. There is no evidence of a vegetation on the aortic valve.  Pulmonic Valve: The pulmonic valve was normal in structure. Pulmonic valve regurgitation is mild by color flow Doppler.    Norleen Pope MD Electronically signed by Norleen Pope MD Signature Date/Time: 01/24/2020/6:51:55 PM    Final    CT SCANS  CT CORONARY MORPH W/CTA COR W/SCORE 12/01/2023  Narrative CLINICAL DATA:  Aortic valve replacement (TAVR), pre-op eval  EXAM: Cardiac TAVR CT  TECHNIQUE: A non-contrast, gated CT scan was obtained with axial slices of 2.5 mm through the heart for aortic valve scoring. A 120 kV retrospective, gated, contrast cardiac scan was obtained. Gantry rotation speed was 230 msec and collimation was 0.63 mm. Nitroglycerin  was not given. A delayed scan was obtained to exclude left atrial appendage thrombus. The 3D dataset was reconstructed in systole with motion correction. The 3D data set was reconstructed in 5% intervals of the 0-95% of the R-R cycle. Systolic and diastolic phases were analyzed on a dedicated workstation using MPR, MIP, and VRT modes. The patient received 100 cc of contrast.  FINDINGS: Aortic Valve:  Tricuspid aortic valve with severely reduced cusp excursion. Severely thickened and severely calcified aortic valve cusps.  AV calcium  score: 2019  Virtual Basal Annulus Measurements:  Maximum/Minimum Diameter: 30 x 23.9 mm  Perimeter: 82.1 mm  Area:  513 mm2  Mild LVOT calcifications.  Membranous septal length: 9.6 mm  Based on these measurements, the annulus would be suitable for a 26 mm Sapien 3  valve. Alternatively, Heart Team can consider 34 mm Evolut valve. Recommend Heart Team discussion for valve selection.  Sinus of Valsalva Measurements:  Non-coronary:  34 mm  Right - coronary:  33 mm  Left - coronary:  33 mm  Coronary height and sinus of Valsalva Height:  Left main: 16.5 mm, Left  sinus: 22.3 mm  Right coronary: 18 mm, Right sinus: 20 mm  Aorta: Very short segment common origin of the brachiocephalic and left common carotid arteries.  Sinotubular Junction:  29 mm  Ascending Thoracic Aorta:  32 mm  Aortic Arch:  28 mm  Descending Thoracic Aorta:  24 mm  Coronary Arteries: Normal coronary origin. Right dominance. The study was performed without use of NTG and insufficient for plaque evaluation. S/p CABG  Optimum Fluoroscopic Angle for Delivery: LAO 8 CAU 8  OTHER:  Atria: Moderate left atrial dilation, mild right atrial dilation  Left atrial appendage: No thrombus.  Mitral valve: Grossly normal, mild mitral annular calcifications.  Pulmonary artery: Normal caliber.  Pulmonary veins: Normal anatomy.  Possible small PFO  IMPRESSION: 1. 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 Severely thickened and severely calcified aortic valve cusps. 2. Aortic valve calcium  score: 2019 3. Annulus area: 513 mm2, suitable for 26 mm Sapien 3 valve. Mild LVOT calcifications. Membranous septal length 9.6 mm. 4. Sufficient coronary artery heights from annulus. 5. Optimum fluoroscopic angle for delivery:  LAO 8 CAU 8   Electronically Signed By: Soyla Merck M.D. On: 12/01/2023 23:19     ______________________________________________________________________________________________      ECG NSR w/ RBBB & LAFB    I have independently reviewed the above radiologic studies and discussed with the patient   Recent Lab Findings: Lab Results  Component Value Date   WBC 13.3 (H) 11/22/2023   HGB 14.8 11/22/2023   HCT 44.2 11/22/2023    PLT 152 11/22/2023   GLUCOSE 196 (H) 11/22/2023   CHOL 111 11/22/2023   TRIG 50 11/22/2023   HDL 55 11/22/2023   LDLCALC 46 11/22/2023   ALT 27 07/30/2022   AST 24 07/30/2022   NA 135 11/22/2023   K 3.8 11/22/2023   CL 100 11/22/2023   CREATININE 1.25 (H) 11/22/2023   BUN 29 (H) 11/22/2023   CO2 22 11/22/2023   TSH 1.250 11/18/2023   INR 1.0 11/18/2023   HGBA1C 7.1 (H) 11/21/2023      Assessment / Plan:   83 y.o. male with severe aortic stenosis.  STS score: 4.48.  NYHA Class II.  The risks and benefits of transfemoral TAVR were discussed in detail.  The risks included death, stroke, paravalvular leak, aortic dissection, annulus rupture, device embolization, acute myocardial infarction, arrhythmia, heart block or need for permanent pacemaker.  I explained that his risk of pacemaker is higher than average given his pre-existing LBBB and LAFB.  We also discussed possibility of an emergent sternotomy to address any procedural complications.  In regards to bailout candidacy, he states he would want life saving heart surgery but would never want to be in a nursing home.  We discussed that there is a range of things that could happen, and we (Dr. Wonda and I) will use our judgement to make the best decision that aligns with his goals of care if a complication were to occur.  He is in agreement with this plan.  The patient is agreeable to proceed.  Based on my review of his LHC, echo, and CTA, I agree with the multidisciplinary plan to proceed with a transfemoral TAVR.  Access concerns:  Right inguinal hernia that is quite symptomatic.  Has also had 6-7 caths via right groin.  Reports issues with right radial caths in the past, has had some through the left radial.  Previous left inguinal hernia repair (3 years ago at [facility]).  Would tentatively  plan for left femoral artery as primary sight, left radial artery as secondary sight and RIJ pacer.  I  spent 30 minutes counseling the patient face to  face.   Con RAMAN Fleda Pagel 12/15/2023 11:19 AM

## 2023-12-15 NOTE — Progress Notes (Signed)
 [facility]   MULTIDISCIPLINARY HEART VALVE CLINIC    HALEEM HANNER [facility] Medical Record #[id number] Date of Birth: [birth year]  Referring: Verlin Lonni BIRCH* Primary Care: Vida Mardy DEL, PA-C Primary Cardiologist:Samuel Debera, MD  Chief Complaint:    Chief Complaint  Patient presents with   Aortic Stenosis    TAVR consult/ review all testing    History of Present Illness:     Sean Villarreal is a 83 y.o. male presents for evaluation of severe aortic stenosis and TAVR.  He also has a history of CAD and is s/p CABG x 2 (SVG - OM, LIMA - LAD in 2021 with Dr. Kerrin - all grafts open) and recently DES to RCA in September after NSTEMI.  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  He was then admitted at the end of October with recurrent NSTEMI without evidence of new occlusion on LHC but concerns for LFLG aortic stenosis.    His main concern currently is right groin pain which is significantly limiting his activty.  This pain started after his last LHC, and ultrasound did not demonstrate a psudeoaneurysm.  He reports it feels better when he lays down.  He has ordered a hernia belt that should be arriving in the next day or so.  He was supposed to see general surgery but they canceled his appointment until his cardiac issues are resolved.  Prior to the groin pain, he would mow his yard (2.5 acres), works in the garage and did a lot of walking.  He was having some chest pain and dyspnea with exertion.  Denies syncope or dizziness.  He also reports about 25 lb unintentional weight loss in the last 3-4 months, but his weight has been stable the last month.  He has no teeth, full dentures.  My measurements: Annulus: Area 508, Peri 82.5, Dia 25.4 SOV: 30.3-33.3 RCA: 13.8 LCA: 17.1 Both transfemoral arteries seem adequate, see access concerns below.   Past Medical History:  Diagnosis Date   Aortic stenosis    a. 03/2022 Echo: EF 60-65%, no rwma, mild  LVH, nl RV fxn, mild-mod AS.   Arthritis    Basal cell carcinoma    Coronary artery disease    a. DES to LAD and LCx April 2014 b. PTCA ostial circumflex 03/2016 due to ISR c.  9/18 PCI/DESx2 osital Lcx (ISR), p/mLCx  d. s/p CABG on 01/24/2020 with LIMA-LAD and SVG-OM.   Essential hypertension    GERD (gastroesophageal reflux disease)    Hematuria    History of blood transfusion 09/2012   History of kidney stones    Hyperlipidemia LDL goal <70    NSTEMI (non-ST elevated myocardial infarction) (HCC) 08/2012   Post-op Afib (HCC)    a. 12/2019-01/2020 following CABG. Briefly on amio.   Type II diabetes mellitus (HCC)     Past Surgical History:  Procedure Laterality Date   BASAL CELL CARCINOMA EXCISION     right cheek; both shoulders   CARDIAC CATHETERIZATION     CATARACT EXTRACTION W/ INTRAOCULAR LENS  IMPLANT, BILATERAL Bilateral    COLONOSCOPY N/A 03/19/2016   Procedure: COLONOSCOPY;  Surgeon: Claudis RAYMOND Rivet, MD;  Location: [facility];  Service: Endoscopy;  Laterality: N/A;  9:15   CORONARY ARTERY BYPASS GRAFT N/A 01/24/2020   Procedure: CORONARY ARTERY BYPASS GRAFTING (CABG), ON PUMP, TIMES TWO, USING LEFT INTERNAL MAMMARY ARTERY AND ENDOSCOPICALLY HARVESTED RIGHT GREATER SAPHENOUS VEIN;  Surgeon: Kerrin Elspeth BROCKS, MD;  Location:  [facility] OR;  Service: Open Heart Surgery;  Laterality: N/A;   CORONARY BALLOON ANGIOPLASTY N/A 04/15/2016   Procedure: Coronary Balloon Angioplasty;  Surgeon: Peter M Jordan, MD;  Location: [facility] INVASIVE CV LAB;  Service: Cardiovascular;  Laterality: N/A;   CORONARY STENT INTERVENTION N/A 10/11/2016   Procedure: CORONARY STENT INTERVENTION;  Surgeon: Dann Candyce RAMAN, MD;  Location: [facility] INVASIVE CV LAB;  Service: Cardiovascular;  Laterality: N/A;   CYSTOSCOPY N/A 12/30/2022   Procedure: CYSTOSCOPY;  Surgeon: Sherrilee Belvie CROME, MD;  Location: [facility] ORS;  Service: Urology;  Laterality: N/A;   CYSTOSCOPY W/ URETEROSCOPY W/ LITHOTRIPSY  10/2012   /notes  11/10/2012   CYSTOSCOPY WITH STENT PLACEMENT  08/2012; 09/2012   EYE SURGERY Left ~ 02/2016   for crinkled lens   INGUINAL HERNIA REPAIR Left 12/24/2020   Procedure: HERNIA REPAIR INGUINAL WITH MESH;  Surgeon: Kallie Manuelita BROCKS, MD;  Location: [facility] ORS;  Service: General;  Laterality: Left;   LEFT HEART CATH AND CORONARY ANGIOGRAPHY N/A 04/15/2016   Procedure: Left Heart Cath and Coronary Angiography;  Surgeon: Peter M Jordan, MD;  Location: [facility] INVASIVE CV LAB;  Service: Cardiovascular;  Laterality: N/A;   LEFT HEART CATH AND CORONARY ANGIOGRAPHY N/A 10/11/2016   Procedure: LEFT HEART CATH AND CORONARY ANGIOGRAPHY;  Surgeon: Dann Candyce RAMAN, MD;  Location: [facility] INVASIVE CV LAB;  Service: Cardiovascular;  Laterality: N/A;   LEFT HEART CATH AND CORONARY ANGIOGRAPHY N/A 03/31/2018   Procedure: LEFT HEART CATH AND CORONARY ANGIOGRAPHY;  Surgeon: Burnard Debby LABOR, MD;  Location: [facility] INVASIVE CV LAB;  Service: Cardiovascular;  Laterality: N/A;   LEFT HEART CATH AND CORONARY ANGIOGRAPHY N/A 01/21/2020   Procedure: LEFT HEART CATH AND CORONARY ANGIOGRAPHY;  Surgeon: Claudene Victory ORN, MD;  Location: [facility] INVASIVE CV LAB;  Service: Cardiovascular;  Laterality: N/A;   LEFT HEART CATH AND CORONARY ANGIOGRAPHY N/A 11/21/2023   Procedure: LEFT HEART CATH AND CORONARY ANGIOGRAPHY;  Surgeon: Verlin Lonni BIRCH, MD;  Location: [facility] INVASIVE CV LAB;  Service: Cardiovascular;  Laterality: N/A;   LEFT HEART CATHETERIZATION WITH CORONARY ANGIOGRAM N/A 05/05/2012   Procedure: LEFT HEART CATHETERIZATION WITH CORONARY ANGIOGRAM;  Surgeon: Lonni BIRCH Verlin, MD;  Location: [facility] CATH LAB;  Service: Cardiovascular;  Laterality: N/A;   LEFT HEART CATHETERIZATION WITH CORONARY ANGIOGRAM N/A 05/15/2012   Procedure: LEFT HEART CATHETERIZATION WITH CORONARY ANGIOGRAM;  Surgeon: Lonni BIRCH Verlin, MD;  Location: [facility] CATH LAB;  Service: Cardiovascular;  Laterality: N/A;   TEE WITHOUT CARDIOVERSION N/A 01/24/2020   Procedure:  TRANSESOPHAGEAL ECHOCARDIOGRAM (TEE);  Surgeon: Kerrin Elspeth BROCKS, MD;  Location: [facility] OR;  Service: Open Heart Surgery;  Laterality: N/A;   TONSILLECTOMY  1948   TRANSURETHRAL RESECTION OF PROSTATE N/A 12/30/2022   Procedure: TRANSURETHRAL RESECTION OF THE PROSTATE (TURP);  Surgeon: Sherrilee Belvie CROME, MD;  Location: [facility]S;  Service: Urology;  Laterality: N/A;    Social History:  Social History   Tobacco Use  Smoking Status Former   Current packs/day: 0.00   Average packs/day: 0.8 packs/day for 30.0 years (22.5 ttl pk-yrs)   Types: Cigarettes   Start date: 01/25/1982   Quit date: 11/05/1988   Years since quitting: 35.1  Smokeless Tobacco Never    Social History   Substance and Sexual Activity  Alcohol  Use Not Currently   Alcohol /week: 0.0 standard drinks of alcohol      Allergies  Allergen Reactions   Contrast Media [Iodinated Contrast Media] Hives and Other (See Comments)    Had welts on arm, and kidney  issues after given dye in the past   Jardiance  [Empagliflozin ] Diarrhea   Penicillins Other (See Comments)    Passed out as a child       Current Outpatient Medications  Medication Sig Dispense Refill   acetaminophen  (TYLENOL ) 500 MG tablet Take 1,000 mg by mouth every 6 (six) hours as needed for mild pain (pain score 1-3).     aspirin  EC 81 MG tablet Take 81 mg by mouth in the morning. Swallow whole.     atorvastatin  (LIPITOR ) 80 MG tablet Take 1 tablet (80 mg total) by mouth daily. 90 tablet 3   carvedilol  (COREG ) 12.5 MG tablet Take 1 tablet (12.5 mg total) by mouth 2 (two) times daily. 180 tablet 2   clopidogrel  (PLAVIX ) 75 MG tablet Take 1 tablet (75 mg total) by mouth daily. 90 tablet 3   dapagliflozin  propanediol (FARXIGA ) 10 MG TABS tablet Take 1 tablet (10 mg total) by mouth daily before breakfast. 90 tablet 3   Dulaglutide (TRULICITY) 0.75 MG/0.5ML SOAJ Inject 0.75 mg as directed once a week. 3 mL 3   ezetimibe (ZETIA) 10 MG tablet Take 1 tablet (10 mg  total) by mouth at bedtime. 90 tablet 3   fluticasone  (FLONASE ) 50 MCG/ACT nasal spray Place 1 spray into both nostrils daily as needed for allergies or rhinitis.     furosemide  (LASIX ) 40 MG tablet Take 1 tablet (40 mg total) by mouth daily. 90 tablet 3   glimepiride  (AMARYL ) 1 MG tablet Take 1 tablet (1 mg total) by mouth daily. 90 tablet 3   ketoconazole  (NIZORAL ) 2 % cream Apply topically to affected area twice daily. 60 g 6   latanoprost  (XALATAN ) 0.005 % ophthalmic solution Place 1 drop into the left eye at bedtime.     Magnesium  400 MG TABS Take 1 tablet by mouth daily. 90 tablet 3   metFORMIN  (GLUCOPHAGE ) 1000 MG tablet Take 1 tablet (1,000 mg total) by mouth 2 (two) times daily. 180 tablet 3   nitroGLYCERIN  (NITROSTAT ) 0.4 MG SL tablet Place 0.4 mg under the tongue every 5 (five) minutes x 3 doses as needed for chest pain.     potassium chloride  SA (KLOR-CON  M) 20 MEQ tablet Take 1 tablet (20 mEq total) by mouth daily. 90 tablet 1   SIMBRINZA 1-0.2 % SUSP Place 1 drop into the left eye 2 (two) times daily.     spironolactone  (ALDACTONE ) 25 MG tablet Take 0.5 tablets (12.5 mg total) by mouth daily. 45 tablet 3   tamsulosin  (FLOMAX ) 0.4 MG CAPS capsule Take 1 capsule (0.4 mg total) by mouth 2 (two) times daily. 180 capsule 3   No current facility-administered medications for this visit.    (Not in a hospital admission)   Family History  Problem Relation Age of Onset   Heart disease Mother    Heart failure Mother    Heart disease Father    Heart attack Father    Colon cancer Neg Hx      Review of Systems:   Review of Systems  Constitutional:  Positive for malaise/fatigue and weight loss.  Respiratory:  Positive for shortness of breath.   Cardiovascular:  Positive for chest pain. Negative for palpitations and leg swelling.  Gastrointestinal:  Negative for nausea and vomiting.  Neurological:  Negative for dizziness and headaches.      Physical Exam: BP 132/74   Pulse 83    Resp 20   Ht 5' 9 (1.753 m)   Wt 178 lb (80.7  kg)   SpO2 95% Comment: RA  BMI 26.29 kg/m  Physical Exam Constitutional:      Appearance: Normal appearance.  HENT:     Head: Normocephalic and atraumatic.  Cardiovascular:     Rate and Rhythm: Normal rate.     Heart sounds: Murmur heard.  Pulmonary:     Effort: Pulmonary effort is normal.     Breath sounds: Normal breath sounds.  Abdominal:     Palpations: Abdomen is soft.     Comments: Bulge in the right groin that is soft but tender.  Appears to be an inguinal hernia on exam.  Musculoskeletal:        General: No swelling.  Skin:    General: Skin is warm and dry.  Neurological:     General: No focal deficit present.     Mental Status: He is alert and oriented to person, place, and time.       Cardiac Studies & Procedures   ______________________________________________________________________________________________ CARDIAC CATHETERIZATION  CARDIAC CATHETERIZATION 11/21/2023  Conclusion   Ost Cx to Prox Cx lesion is 100% stenosed.   Ost LAD to Prox LAD lesion is 99% stenosed.   Mid LM to Ost LAD lesion is 99% stenosed.   Mid LAD lesion is 100% stenosed.   Previously placed Prox RCA to Mid RCA stent of unknown type is  widely patent.   LIMA graft was visualized by angiography and is normal in caliber.   SVG graft was visualized by angiography and is normal in caliber.  Chronic occlusion proximal LAD. Patent LIMA to mid LAD filling the mid and distal LAD Chronic occlusion ostial Circumflex. Patent vein graft to obtuse marginal branch. Large dominant RCA with patent mid stented segment with no restenosis. Normal LVEDP Severe low flow/low gradient aortic stenosis by echo.  Recommendations: I suspect that his symptoms are related to his aortic stenosis. Will monitor overnight. Will plan outpatient pre-TAVR CT scans and visit with the CT surgeon and continue down pathway for TAVR.  Findings Coronary  Findings Diagnostic  Dominance: Right  Left Main Mid LM to Ost LAD lesion is 99% stenosed.  Left Anterior Descending Ost LAD to Prox LAD lesion is 99% stenosed. Mid LAD lesion is 100% stenosed. The lesion is chronically occluded.  Left Circumflex Ost Cx to Prox Cx lesion is 100% stenosed. The lesion is chronically occluded.  Right Coronary Artery Vessel is large. Previously placed Prox RCA to Mid RCA stent of unknown type is  widely patent.  LIMA LIMA Graft To Dist LAD LIMA graft was visualized by angiography and is normal in caliber.  Saphenous Graft To 2nd Mrg SVG graft was visualized by angiography and is normal in caliber.  Intervention  No interventions have been documented.   CARDIAC CATHETERIZATION  CARDIAC CATHETERIZATION 01/21/2020  Conclusion  Severe calcific coronary disease involving the left anterior descending, circumflex, and left main.  40 to 50% eccentric distal left main  Bulky eccentric 80% ostial to proximal LAD showing progression compared to 2020.  Moderate mid to distal diffuse LAD disease.  Diffuse ostial to proximal in-stent restenosis in circumflex which has 2 layers of stent.  Moderate mid to distal circumflex 50% narrowing.  Occlusion of ramus intermedius, very small and fills by left to left collaterals.  Dominant right coronary with luminal irregularities but no high-grade obstruction.  Normal anterior wall motion.  Estimated > EF 50%.  Inferior wall not well seen.  LVEDP is normal.  RECOMMENDATIONS:   Discontinue Plavix   IV heparin   Recommend two-vessel coronary bypass surgery in the setting of significant proximal LAD disease and in-stent restenosis x3 of the ostial circumflex (2 layers of stent).  2D Doppler echocardiogram to fully assess LV function.  Findings Coronary Findings Diagnostic  Dominance: Right  Left Main Mid LM to Ost LAD lesion is 50% stenosed.  Left Anterior Descending There is moderate diffuse disease  throughout the vessel. Ost LAD lesion is 80% stenosed. Prox LAD lesion is 40% stenosed. The lesion was previously treated. Mid LAD lesion is 75% stenosed.  First Diagonal Branch Vessel is small in size.  Second Diagonal Branch Vessel is large in size. Ost 2nd Diag lesion is 40% stenosed.  Ramus Intermedius Collaterals Ramus filled by collaterals from 2nd Sept.  Ramus lesion is 100% stenosed.  Left Circumflex There is moderate diffuse disease throughout the vessel. Ost Cx to Prox Cx lesion is 75% stenosed. The lesion was previously treated. Prox Cx to Mid Cx lesion is 40% stenosed. The lesion was previously treated. Mid Cx lesion is 40% stenosed.  Right Coronary Artery The vessel exhibits minimal luminal irregularities. Prox RCA to Mid RCA lesion is 20% stenosed.  Intervention  No interventions have been documented.   STRESS TESTS  NM MYOCAR MULTI W/SPECT W 04/01/2022  Narrative   The study is normal. There are no perfusion defects  The study is low risk.   No ST deviation was noted.   LV perfusion is normal.   Left ventricular function is normal. Nuclear stress EF: 67 %. The left ventricular ejection fraction is hyperdynamic (>65%). End diastolic cavity size is normal.   ECHOCARDIOGRAM  ECHOCARDIOGRAM COMPLETE 11/19/2023  Narrative ECHOCARDIOGRAM REPORT    Patient Name:   WIRT HEMMERICH Date of Exam: 11/19/2023 Medical Rec #:  [id number]         Height:       69.0 in Accession #:    [id number]        Weight:       187.4 lb Date of Birth:  06-Aug-1940          BSA:          2.010 m Patient Age:    83 years          BP:           114/72 mmHg Patient Gender: M                 HR:           78 bpm. Exam Location:  Inpatient  Procedure: 2D Echo (Both Spectral and Color Flow Doppler were utilized during procedure).  Indications:    NSTEMI  History:        Patient has prior history of Echocardiogram examinations, most recent 04/10/2023. CAD; Risk  Factors:Hypertension, Dyslipidemia and Diabetes.  Sonographer:    Tinnie Barefoot RDCS Referring Phys: [id number] [address] ELGERGAWY  IMPRESSIONS   1. Left ventricular ejection fraction, by estimation, is 50 to 55%. The left ventricle has low normal function. The left ventricle demonstrates global hypokinesis. There is mild concentric left ventricular hypertrophy. Left ventricular diastolic parameters are consistent with Grade I diastolic dysfunction (impaired relaxation). 2. Right ventricular systolic function is normal. The right ventricular size is normal. There is normal pulmonary artery systolic pressure. 3. The mitral valve is degenerative. Mild mitral valve regurgitation. No evidence of mitral stenosis. Moderate mitral annular calcification. 4. The aortic valve is calcified. There is mild calcification of the aortic valve. There is mild thickening of the  aortic valve. Aortic valve regurgitation is not visualized. Moderate to severe aortic valve stenosis. Aortic valve area, by VTI measures 0.86 cm. Aortic valve mean gradient measures 18.0 mmHg. Aortic valve Vmax measures 2.74 m/s. 5. The inferior vena cava is normal in size with greater than 50% respiratory variability, suggesting right atrial pressure of 3 mmHg.  FINDINGS Left Ventricle: Left ventricular ejection fraction, by estimation, is 50 to 55%. The left ventricle has low normal function. The left ventricle demonstrates global hypokinesis. The left ventricular internal cavity size was normal in size. There is mild concentric left ventricular hypertrophy. Left ventricular diastolic parameters are consistent with Grade I diastolic dysfunction (impaired relaxation).  Right Ventricle: The right ventricular size is normal. No increase in right ventricular wall thickness. Right ventricular systolic function is normal. There is normal pulmonary artery systolic pressure. The tricuspid regurgitant velocity is 2.20 m/s, and with an assumed  right atrial pressure of 3 mmHg, the estimated right ventricular systolic pressure is 22.4 mmHg.  Left Atrium: Left atrial size was normal in size.  Right Atrium: Right atrial size was normal in size.  Pericardium: There is no evidence of pericardial effusion.  Mitral Valve: The mitral valve is degenerative in appearance. Moderate mitral annular calcification. Mild mitral valve regurgitation. No evidence of mitral valve stenosis. MV peak gradient, 5.4 mmHg. The mean mitral valve gradient is 3.0 mmHg.  Tricuspid Valve: The tricuspid valve is normal in structure. Tricuspid valve regurgitation is not demonstrated. No evidence of tricuspid stenosis.  Aortic Valve: The aortic valve is calcified. There is mild calcification of the aortic valve. There is mild thickening of the aortic valve. Aortic valve regurgitation is not visualized. Moderate to severe aortic stenosis is present. Aortic valve mean gradient measures 18.0 mmHg. Aortic valve peak gradient measures 30.0 mmHg. Aortic valve area, by VTI measures 0.86 cm.  Pulmonic Valve: The pulmonic valve was normal in structure. Pulmonic valve regurgitation is mild. No evidence of pulmonic stenosis.  Aorta: The aortic root is normal in size and structure.  Venous: The inferior vena cava is normal in size with greater than 50% respiratory variability, suggesting right atrial pressure of 3 mmHg.  IAS/Shunts: No atrial level shunt detected by color flow Doppler.   LEFT VENTRICLE PLAX 2D LVIDd:         5.20 cm     Diastology LVIDs:         4.00 cm     LV e' medial:    4.46 cm/s LV PW:         1.00 cm     LV E/e' medial:  18.5 LV IVS:        1.00 cm     LV e' lateral:   7.07 cm/s LVOT diam:     2.00 cm     LV E/e' lateral: 11.7 LV SV:         52 LV SV Index:   26 LVOT Area:     3.14 cm  LV Volumes (MOD) LV vol d, MOD A4C: 83.0 ml LV vol s, MOD A4C: 53.8 ml LV SV MOD A4C:     83.0 ml  RIGHT VENTRICLE            IVC RV Basal diam:  3.10 cm     IVC diam: 2.00 cm RV S prime:     6.96 cm/s TAPSE (M-mode): 1.0 cm  LEFT ATRIUM             Index  RIGHT ATRIUM           Index LA diam:        4.70 cm 2.34 cm/m   RA Area:     14.20 cm LA Vol (A2C):   56.9 ml 28.31 ml/m  RA Volume:   29.90 ml  14.88 ml/m LA Vol (A4C):   65.6 ml 32.64 ml/m LA Biplane Vol: 61.7 ml 30.70 ml/m AORTIC VALVE AV Area (Vmax):    0.90 cm AV Area (Vmean):   0.86 cm AV Area (VTI):     0.86 cm AV Vmax:           274.00 cm/s AV Vmean:          198.000 cm/s AV VTI:            0.602 m AV Peak Grad:      30.0 mmHg AV Mean Grad:      18.0 mmHg LVOT Vmax:         78.50 cm/s LVOT Vmean:        54.450 cm/s LVOT VTI:          0.165 m LVOT/AV VTI ratio: 0.27  AORTA Ao Root diam: 3.30 cm Ao Asc diam:  3.30 cm  MITRAL VALVE                TRICUSPID VALVE MV Area (PHT): 3.31 cm     TR Peak grad:   19.4 mmHg MV Area VTI:   1.72 cm     TR Vmax:        220.00 cm/s MV Peak grad:  5.4 mmHg MV Mean grad:  3.0 mmHg     SHUNTS MV Vmax:       1.16 m/s     Systemic VTI:  0.16 m MV Vmean:      80.3 cm/s    Systemic Diam: 2.00 cm MV Decel Time: 229 msec MV E velocity: 82.50 cm/s MV A velocity: 118.00 cm/s MV E/A ratio:  0.70  Kardie Tobb DO Electronically signed by Dub Huntsman DO Signature Date/Time: 11/19/2023/4:31:03 PM    Final   TEE  ECHO INTRAOPERATIVE TEE 01/24/2020  Narrative *INTRAOPERATIVE TRANSESOPHAGEAL REPORT *    Patient Name:   JOHNATON SONNEBORN Date of Exam: 01/24/2020 Medical Rec #:  [id number]         Height:       68.0 in Accession #:    [id number]        Weight:       204.8 lb Date of Birth:  Aug 01, 1940          BSA:          2.06 m Patient Age:    79 years          BP:           138/79 mmHg Patient Gender: M                 HR:           71 bpm. Exam Location:  Inpatient  Transesophogeal exam was perform intraoperatively during surgical procedure. Patient was closely monitored under general anesthesia during the  entirety of examination.  Indications:     CABG Performing Phys: [id number] ELSPETH BROCKS HENDRICKSON Diagnosing Phys: Norleen Pope MD  Complications: No known complications during this procedure. POST-OP IMPRESSIONS - Left Ventricle: The left ventricle is unchanged from pre-bypass. - Right Ventricle: The right ventricle appears unchanged from pre-bypass. - Aorta: The aorta appears unchanged from  pre-bypass. - Left Atrium: The left atrium appears unchanged from pre-bypass. - Left Atrial Appendage: The left atrial appendage appears unchanged from pre-bypass. - Aortic Valve: The aortic valve appears unchanged from pre-bypass. - Mitral Valve: The mitral valve appears unchanged from pre-bypass. - Tricuspid Valve: The tricuspid valve appears unchanged from pre-bypass. - Interatrial Septum: The interatrial septum appears unchanged from pre-bypass. - Interventricular Septum: The interventricular septum appears unchanged from pre-bypass. - Pericardium: The pericardium appears unchanged from pre-bypass.  PRE-OP FINDINGS Left Ventricle: The left ventricle has normal systolic function, with an ejection fraction of 55-60%. The cavity size was normal. There is moderate concentric left ventricular hypertrophy.  Right Ventricle: The right ventricle has normal systolic function. The cavity was mildly enlarged. There is no increase in right ventricular wall thickness.  Left Atrium: Left atrial size was normal in size. The left atrial appendage is well visualized and there is no evidence of thrombus present.  Right Atrium: Right atrial size was normal in size.  Interatrial Septum: No atrial level shunt detected by color flow Doppler.  Pericardium: There is no evidence of pericardial effusion.  Mitral Valve: The mitral valve is normal in structure. Mild thickening of the mitral valve leaflet. Mild calcification of the mitral valve leaflet. Mitral valve regurgitation is mild by color flow Doppler. There is no  evidence of mitral valve vegetation.  Tricuspid Valve: The tricuspid valve was normal in structure. Tricuspid valve regurgitation is mild by color flow Doppler. There is no evidence of tricuspid valve vegetation.  Aortic Valve: The aortic valve is tricuspid Aortic valve regurgitation is mild by color flow Doppler. There is mild stenosis of the aortic valve. There is no evidence of a vegetation on the aortic valve.  Pulmonic Valve: The pulmonic valve was normal in structure. Pulmonic valve regurgitation is mild by color flow Doppler.    Norleen Pope MD Electronically signed by Norleen Pope MD Signature Date/Time: 01/24/2020/6:51:55 PM    Final    CT SCANS  CT CORONARY MORPH W/CTA COR W/SCORE 12/01/2023  Narrative CLINICAL DATA:  Aortic valve replacement (TAVR), pre-op eval  EXAM: Cardiac TAVR CT  TECHNIQUE: A non-contrast, gated CT scan was obtained with axial slices of 2.5 mm through the heart for aortic valve scoring. A 120 kV retrospective, gated, contrast cardiac scan was obtained. Gantry rotation speed was 230 msec and collimation was 0.63 mm. Nitroglycerin  was not given. A delayed scan was obtained to exclude left atrial appendage thrombus. The 3D dataset was reconstructed in systole with motion correction. The 3D data set was reconstructed in 5% intervals of the 0-95% of the R-R cycle. Systolic and diastolic phases were analyzed on a dedicated workstation using MPR, MIP, and VRT modes. The patient received 100 cc of contrast.  FINDINGS: Aortic Valve:  Tricuspid aortic valve with severely reduced cusp excursion. Severely thickened and severely calcified aortic valve cusps.  AV calcium  score: 2019  Virtual Basal Annulus Measurements:  Maximum/Minimum Diameter: 30 x 23.9 mm  Perimeter: 82.1 mm  Area:  513 mm2  Mild LVOT calcifications.  Membranous septal length: 9.6 mm  Based on these measurements, the annulus would be suitable for a 26 mm Sapien 3  valve. Alternatively, Heart Team can consider 34 mm Evolut valve. Recommend Heart Team discussion for valve selection.  Sinus of Valsalva Measurements:  Non-coronary:  34 mm  Right - coronary:  33 mm  Left - coronary:  33 mm  Coronary height and sinus of Valsalva Height:  Left main: 16.5 mm, Left  sinus: 22.3 mm  Right coronary: 18 mm, Right sinus: 20 mm  Aorta: Very short segment common origin of the brachiocephalic and left common carotid arteries.  Sinotubular Junction:  29 mm  Ascending Thoracic Aorta:  32 mm  Aortic Arch:  28 mm  Descending Thoracic Aorta:  24 mm  Coronary Arteries: Normal coronary origin. Right dominance. The study was performed without use of NTG and insufficient for plaque evaluation. S/p CABG  Optimum Fluoroscopic Angle for Delivery: LAO 8 CAU 8  OTHER:  Atria: Moderate left atrial dilation, mild right atrial dilation  Left atrial appendage: No thrombus.  Mitral valve: Grossly normal, mild mitral annular calcifications.  Pulmonary artery: Normal caliber.  Pulmonary veins: Normal anatomy.  Possible small PFO  IMPRESSION: 1. 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 Severely thickened and severely calcified aortic valve cusps. 2. Aortic valve calcium  score: 2019 3. Annulus area: 513 mm2, suitable for 26 mm Sapien 3 valve. Mild LVOT calcifications. Membranous septal length 9.6 mm. 4. Sufficient coronary artery heights from annulus. 5. Optimum fluoroscopic angle for delivery:  LAO 8 CAU 8   Electronically Signed By: Soyla Merck M.D. On: 12/01/2023 23:19     ______________________________________________________________________________________________      ECG NSR w/ RBBB & LAFB    I have independently reviewed the above radiologic studies and discussed with the patient   Recent Lab Findings: Lab Results  Component Value Date   WBC 13.3 (H) 11/22/2023   HGB 14.8 11/22/2023   HCT 44.2 11/22/2023    PLT 152 11/22/2023   GLUCOSE 196 (H) 11/22/2023   CHOL 111 11/22/2023   TRIG 50 11/22/2023   HDL 55 11/22/2023   LDLCALC 46 11/22/2023   ALT 27 07/30/2022   AST 24 07/30/2022   NA 135 11/22/2023   K 3.8 11/22/2023   CL 100 11/22/2023   CREATININE 1.25 (H) 11/22/2023   BUN 29 (H) 11/22/2023   CO2 22 11/22/2023   TSH 1.250 11/18/2023   INR 1.0 11/18/2023   HGBA1C 7.1 (H) 11/21/2023      Assessment / Plan:   83 y.o. male with severe aortic stenosis.  STS score: 4.48.  NYHA Class II.  The risks and benefits of transfemoral TAVR were discussed in detail.  The risks included death, stroke, paravalvular leak, aortic dissection, annulus rupture, device embolization, acute myocardial infarction, arrhythmia, heart block or need for permanent pacemaker.  I explained that his risk of pacemaker is higher than average given his pre-existing LBBB and LAFB.  We also discussed possibility of an emergent sternotomy to address any procedural complications.  In regards to bailout candidacy, he states he would want life saving heart surgery but would never want to be in a nursing home.  We discussed that there is a range of things that could happen, and we (Dr. Wonda and I) will use our judgement to make the best decision that aligns with his goals of care if a complication were to occur.  He is in agreement with this plan.  The patient is agreeable to proceed.  Based on my review of his LHC, echo, and CTA, I agree with the multidisciplinary plan to proceed with a transfemoral TAVR.  Access concerns:  Right inguinal hernia that is quite symptomatic.  Has also had 6-7 caths via right groin.  Reports issues with right radial caths in the past, has had some through the left radial.  Previous left inguinal hernia repair (3 years ago at [facility]).  Would tentatively  plan for left femoral artery as primary sight, left radial artery as secondary sight and RIJ pacer.  I  spent 30 minutes counseling the patient face to  face.   Con RAMAN Fleda Pagel 12/15/2023 11:19 AM

## 2023-12-15 NOTE — Progress Notes (Signed)
 Pre Surgical Assessment: 5 M Walk Test  25M=16.33ft  5 Meter Walk Test- trial 1: N/A seconds 5 Meter Walk Test- trial 2: N/A seconds 5 Meter Walk Test- trial 3: N/A seconds 5 Meter Walk Test Average: N/A seconds   Patient declined test due to inguinal hernia causing pain while walking.

## 2023-12-16 ENCOUNTER — Telehealth: Payer: Self-pay | Admitting: Student

## 2023-12-16 ENCOUNTER — Other Ambulatory Visit (HOSPITAL_BASED_OUTPATIENT_CLINIC_OR_DEPARTMENT_OTHER): Payer: Self-pay

## 2023-12-16 ENCOUNTER — Ambulatory Visit: Admitting: Nurse Practitioner

## 2023-12-16 ENCOUNTER — Other Ambulatory Visit: Payer: Self-pay

## 2023-12-16 DIAGNOSIS — I350 Nonrheumatic aortic (valve) stenosis: Secondary | ICD-10-CM

## 2023-12-16 DIAGNOSIS — E1165 Type 2 diabetes mellitus with hyperglycemia: Secondary | ICD-10-CM | POA: Diagnosis not present

## 2023-12-16 MED ORDER — PREDNISONE 50 MG PO TABS
ORAL_TABLET | ORAL | 0 refills | Status: DC
  Filled 2023-12-16: qty 2, 1d supply, fill #0

## 2023-12-16 NOTE — Progress Notes (Unsigned)
  Procedure Type: Isolated AVR  Perioperative Outcome Estimate %  Operative Mortality 4.48%  Morbidity & Mortality 22.7%  Stroke 3.88%  Renal Failure 3.41%  Reoperation 7.26%  Prolonged Ventilation 11.1%  Deep Sternal Wound Infection 0.125%  Long Hospital Stay (>14 days) 7.98%  Short Hospital Stay (<6 days)* 34.2%

## 2023-12-16 NOTE — Telephone Encounter (Signed)
    After the patient's appointment, I did review with Dr. Kallie in regards to his right groin pain. She reviewed his CT Scan and did not feel there was any evidence of a right sided hernia at that time. Did note some fat stranding. She felt that the pain was likely from his recent cardiac catheterization and recommended conservative measures with heat, rest and Tylenol . Did not feel there is indication for a hernia belt given CT results. Per the patient's DPR, I called and left a voicemail with this information.  Signed, Laymon CHRISTELLA Qua, PA-C 12/16/2023, 4:11 PM Pager: [phone]

## 2023-12-19 ENCOUNTER — Other Ambulatory Visit (HOSPITAL_BASED_OUTPATIENT_CLINIC_OR_DEPARTMENT_OTHER): Payer: Self-pay

## 2023-12-19 NOTE — Telephone Encounter (Signed)
 Pt notified of B. Strader's notes and the need of heat, relax and Tylenol . Pt agrees with plan of care.

## 2023-12-19 NOTE — Telephone Encounter (Signed)
Called pt. No answer. Left msg to return call.   

## 2023-12-19 NOTE — Telephone Encounter (Signed)
 Patient was returning call. Please advise ?

## 2023-12-20 ENCOUNTER — Other Ambulatory Visit (HOSPITAL_BASED_OUTPATIENT_CLINIC_OR_DEPARTMENT_OTHER): Payer: Self-pay

## 2023-12-21 ENCOUNTER — Other Ambulatory Visit (HOSPITAL_BASED_OUTPATIENT_CLINIC_OR_DEPARTMENT_OTHER): Payer: Self-pay

## 2023-12-22 DIAGNOSIS — I214 Non-ST elevation (NSTEMI) myocardial infarction: Secondary | ICD-10-CM | POA: Diagnosis not present

## 2023-12-22 DIAGNOSIS — I10 Essential (primary) hypertension: Secondary | ICD-10-CM | POA: Diagnosis not present

## 2023-12-22 DIAGNOSIS — R1031 Right lower quadrant pain: Secondary | ICD-10-CM | POA: Diagnosis not present

## 2023-12-22 DIAGNOSIS — I5032 Chronic diastolic (congestive) heart failure: Secondary | ICD-10-CM | POA: Diagnosis not present

## 2023-12-22 DIAGNOSIS — I350 Nonrheumatic aortic (valve) stenosis: Secondary | ICD-10-CM | POA: Diagnosis not present

## 2023-12-22 DIAGNOSIS — I255 Ischemic cardiomyopathy: Secondary | ICD-10-CM | POA: Diagnosis not present

## 2023-12-23 ENCOUNTER — Encounter (HOSPITAL_COMMUNITY): Payer: Self-pay

## 2023-12-26 ENCOUNTER — Ambulatory Visit (HOSPITAL_COMMUNITY)
Admission: RE | Admit: 2023-12-26 | Discharge: 2023-12-26 | Disposition: A | Source: Ambulatory Visit | Attending: Cardiovascular Disease | Admitting: Cardiovascular Disease

## 2023-12-26 ENCOUNTER — Encounter (HOSPITAL_COMMUNITY)
Admission: RE | Admit: 2023-12-26 | Discharge: 2023-12-26 | Disposition: A | Source: Ambulatory Visit | Attending: Cardiovascular Disease

## 2023-12-26 DIAGNOSIS — I442 Atrioventricular block, complete: Secondary | ICD-10-CM | POA: Diagnosis present

## 2023-12-26 DIAGNOSIS — Z91041 Radiographic dye allergy status: Secondary | ICD-10-CM | POA: Diagnosis not present

## 2023-12-26 DIAGNOSIS — Z95810 Presence of automatic (implantable) cardiac defibrillator: Secondary | ICD-10-CM | POA: Diagnosis not present

## 2023-12-26 DIAGNOSIS — I2510 Atherosclerotic heart disease of native coronary artery without angina pectoris: Secondary | ICD-10-CM | POA: Diagnosis present

## 2023-12-26 DIAGNOSIS — Z8249 Family history of ischemic heart disease and other diseases of the circulatory system: Secondary | ICD-10-CM | POA: Diagnosis not present

## 2023-12-26 DIAGNOSIS — Z006 Encounter for examination for normal comparison and control in clinical research program: Secondary | ICD-10-CM | POA: Diagnosis not present

## 2023-12-26 DIAGNOSIS — I214 Non-ST elevation (NSTEMI) myocardial infarction: Secondary | ICD-10-CM | POA: Diagnosis present

## 2023-12-26 DIAGNOSIS — Z7984 Long term (current) use of oral hypoglycemic drugs: Secondary | ICD-10-CM | POA: Diagnosis not present

## 2023-12-26 DIAGNOSIS — I350 Nonrheumatic aortic (valve) stenosis: Secondary | ICD-10-CM | POA: Insufficient documentation

## 2023-12-26 DIAGNOSIS — Z952 Presence of prosthetic heart valve: Secondary | ICD-10-CM | POA: Diagnosis not present

## 2023-12-26 DIAGNOSIS — Z79899 Other long term (current) drug therapy: Secondary | ICD-10-CM | POA: Diagnosis not present

## 2023-12-26 DIAGNOSIS — Z01818 Encounter for other preprocedural examination: Secondary | ICD-10-CM | POA: Diagnosis not present

## 2023-12-26 DIAGNOSIS — K219 Gastro-esophageal reflux disease without esophagitis: Secondary | ICD-10-CM | POA: Diagnosis present

## 2023-12-26 DIAGNOSIS — Z87891 Personal history of nicotine dependence: Secondary | ICD-10-CM | POA: Diagnosis not present

## 2023-12-26 DIAGNOSIS — I452 Bifascicular block: Secondary | ICD-10-CM | POA: Diagnosis present

## 2023-12-26 DIAGNOSIS — Z951 Presence of aortocoronary bypass graft: Secondary | ICD-10-CM | POA: Diagnosis not present

## 2023-12-26 DIAGNOSIS — Z85828 Personal history of other malignant neoplasm of skin: Secondary | ICD-10-CM | POA: Diagnosis not present

## 2023-12-26 DIAGNOSIS — E119 Type 2 diabetes mellitus without complications: Secondary | ICD-10-CM | POA: Diagnosis present

## 2023-12-26 DIAGNOSIS — Z7982 Long term (current) use of aspirin: Secondary | ICD-10-CM | POA: Diagnosis not present

## 2023-12-26 DIAGNOSIS — I5030 Unspecified diastolic (congestive) heart failure: Secondary | ICD-10-CM | POA: Diagnosis not present

## 2023-12-26 DIAGNOSIS — Z7902 Long term (current) use of antithrombotics/antiplatelets: Secondary | ICD-10-CM | POA: Diagnosis not present

## 2023-12-26 DIAGNOSIS — E785 Hyperlipidemia, unspecified: Secondary | ICD-10-CM | POA: Diagnosis present

## 2023-12-26 DIAGNOSIS — I4891 Unspecified atrial fibrillation: Secondary | ICD-10-CM | POA: Diagnosis present

## 2023-12-26 DIAGNOSIS — N400 Enlarged prostate without lower urinary tract symptoms: Secondary | ICD-10-CM | POA: Diagnosis present

## 2023-12-26 DIAGNOSIS — I110 Hypertensive heart disease with heart failure: Secondary | ICD-10-CM | POA: Diagnosis present

## 2023-12-26 DIAGNOSIS — I5042 Chronic combined systolic (congestive) and diastolic (congestive) heart failure: Secondary | ICD-10-CM | POA: Diagnosis present

## 2023-12-26 DIAGNOSIS — I453 Trifascicular block: Secondary | ICD-10-CM | POA: Diagnosis not present

## 2023-12-26 DIAGNOSIS — I255 Ischemic cardiomyopathy: Secondary | ICD-10-CM | POA: Diagnosis present

## 2023-12-26 DIAGNOSIS — R634 Abnormal weight loss: Secondary | ICD-10-CM | POA: Diagnosis present

## 2023-12-26 DIAGNOSIS — I10 Essential (primary) hypertension: Secondary | ICD-10-CM | POA: Diagnosis not present

## 2023-12-26 DIAGNOSIS — I700 Atherosclerosis of aorta: Secondary | ICD-10-CM | POA: Diagnosis not present

## 2023-12-26 HISTORY — DX: Cardiac murmur, unspecified: R01.1

## 2023-12-26 LAB — URINALYSIS, MICROSCOPIC (REFLEX): Bacteria, UA: NONE SEEN

## 2023-12-26 LAB — CBC
HCT: 50.7 % (ref 39.0–52.0)
Hemoglobin: 16.4 g/dL (ref 13.0–17.0)
MCH: 30.9 pg (ref 26.0–34.0)
MCHC: 32.3 g/dL (ref 30.0–36.0)
MCV: 95.7 fL (ref 80.0–100.0)
Platelets: 164 K/uL (ref 150–400)
RBC: 5.3 MIL/uL (ref 4.22–5.81)
RDW: 13.2 % (ref 11.5–15.5)
WBC: 8.7 K/uL (ref 4.0–10.5)
nRBC: 0 % (ref 0.0–0.2)

## 2023-12-26 LAB — COMPREHENSIVE METABOLIC PANEL WITH GFR
ALT: 19 U/L (ref 0–44)
AST: 18 U/L (ref 15–41)
Albumin: 3.9 g/dL (ref 3.5–5.0)
Alkaline Phosphatase: 42 U/L (ref 38–126)
Anion gap: 12 (ref 5–15)
BUN: 18 mg/dL (ref 8–23)
CO2: 31 mmol/L (ref 22–32)
Calcium: 9.6 mg/dL (ref 8.9–10.3)
Chloride: 102 mmol/L (ref 98–111)
Creatinine, Ser: 1.08 mg/dL (ref 0.61–1.24)
GFR, Estimated: >60 mL/min (ref >60)
Glucose, Bld: 166 mg/dL — ABNORMAL HIGH (ref 70–99)
Potassium: 4.2 mmol/L (ref 3.5–5.1)
Sodium: 145 mmol/L (ref 135–145)
Total Bilirubin: 1 mg/dL (ref 0.0–1.2)
Total Protein: 7.2 g/dL (ref 6.5–8.1)

## 2023-12-26 LAB — PROTIME-INR
INR: 1 (ref 0.8–1.2)
Prothrombin Time: 14.1 s (ref 11.4–15.2)

## 2023-12-26 LAB — URINALYSIS, ROUTINE W REFLEX MICROSCOPIC
Bilirubin Urine: NEGATIVE
Glucose, UA: >=500 mg/dL — AB
Hgb urine dipstick: NEGATIVE
Ketones, ur: NEGATIVE mg/dL
Leukocytes,Ua: NEGATIVE
Nitrite: NEGATIVE
Protein, ur: NEGATIVE mg/dL
Specific Gravity, Urine: 1.015 (ref 1.005–1.030)
pH: 5.5 (ref 5.0–8.0)

## 2023-12-26 LAB — TYPE AND SCREEN
ABO/RH(D): A POS
Antibody Screen: NEGATIVE

## 2023-12-26 LAB — SURGICAL PCR SCREEN
MRSA, PCR: NEGATIVE
Staphylococcus aureus: NEGATIVE

## 2023-12-26 NOTE — Progress Notes (Signed)
 All consents signed by patient at PAT lab appointment. Pt was sent home with printed copy of surgical instructions and CHG soap/CHG soap instructions. All instructions reviewed with patient and questions answered.  Patients chart send to anesthesia for review. Pt denies any respiratory illness/infection in the last two months.

## 2023-12-27 MED ORDER — MAGNESIUM SULFATE 50 % IJ SOLN
40.0000 meq | INTRAMUSCULAR | Status: DC
  Filled 2023-12-27: qty 9.85

## 2023-12-27 MED ORDER — POTASSIUM CHLORIDE 2 MEQ/ML IV SOLN
80.0000 meq | INTRAVENOUS | Status: DC
  Filled 2023-12-27: qty 40

## 2023-12-27 MED ORDER — NOREPINEPHRINE 4 MG/250ML-% IV SOLN
0.0000 ug/min | INTRAVENOUS | Status: AC
  Administered 2023-12-28: 2 ug/min via INTRAVENOUS
  Filled 2023-12-27: qty 250

## 2023-12-27 MED ORDER — DEXMEDETOMIDINE HCL IN NACL 400 MCG/100ML IV SOLN
0.1000 ug/kg/h | INTRAVENOUS | Status: AC
  Administered 2023-12-28: 1 ug/kg/h via INTRAVENOUS
  Filled 2023-12-27: qty 100

## 2023-12-27 MED ORDER — CEFAZOLIN SODIUM-DEXTROSE 2-4 GM/100ML-% IV SOLN
2.0000 g | INTRAVENOUS | Status: AC
  Administered 2023-12-28: 2 g via INTRAVENOUS
  Filled 2023-12-27 (×2): qty 100

## 2023-12-27 MED ORDER — HEPARIN 30,000 UNITS/1000 ML (OHS) CELLSAVER SOLUTION
Status: DC
  Filled 2023-12-27: qty 1000

## 2023-12-28 ENCOUNTER — Other Ambulatory Visit: Payer: Self-pay

## 2023-12-28 ENCOUNTER — Inpatient Hospital Stay (HOSPITAL_COMMUNITY)

## 2023-12-28 ENCOUNTER — Encounter (HOSPITAL_COMMUNITY): Payer: Self-pay | Admitting: Cardiovascular Disease

## 2023-12-28 ENCOUNTER — Encounter (HOSPITAL_COMMUNITY): Admission: RE | Disposition: A | Payer: Self-pay | Source: Home / Self Care | Attending: Cardiovascular Disease

## 2023-12-28 ENCOUNTER — Inpatient Hospital Stay (HOSPITAL_COMMUNITY)
Admission: RE | Admit: 2023-12-28 | Discharge: 2023-12-31 | DRG: 266 | Disposition: A | Attending: Cardiovascular Disease | Admitting: Cardiovascular Disease

## 2023-12-28 ENCOUNTER — Encounter (HOSPITAL_COMMUNITY): Payer: Self-pay | Admitting: Physician Assistant

## 2023-12-28 DIAGNOSIS — I5042 Chronic combined systolic (congestive) and diastolic (congestive) heart failure: Secondary | ICD-10-CM | POA: Diagnosis present

## 2023-12-28 DIAGNOSIS — K4090 Unilateral inguinal hernia, without obstruction or gangrene, not specified as recurrent: Secondary | ICD-10-CM | POA: Diagnosis present

## 2023-12-28 DIAGNOSIS — Z955 Presence of coronary angioplasty implant and graft: Secondary | ICD-10-CM

## 2023-12-28 DIAGNOSIS — I350 Nonrheumatic aortic (valve) stenosis: Principal | ICD-10-CM | POA: Diagnosis present

## 2023-12-28 DIAGNOSIS — Z85828 Personal history of other malignant neoplasm of skin: Secondary | ICD-10-CM

## 2023-12-28 DIAGNOSIS — Z87891 Personal history of nicotine dependence: Secondary | ICD-10-CM

## 2023-12-28 DIAGNOSIS — Z7902 Long term (current) use of antithrombotics/antiplatelets: Secondary | ICD-10-CM

## 2023-12-28 DIAGNOSIS — I442 Atrioventricular block, complete: Secondary | ICD-10-CM | POA: Diagnosis present

## 2023-12-28 DIAGNOSIS — I452 Bifascicular block: Secondary | ICD-10-CM | POA: Diagnosis present

## 2023-12-28 DIAGNOSIS — Z880 Allergy status to penicillin: Secondary | ICD-10-CM

## 2023-12-28 DIAGNOSIS — I10 Essential (primary) hypertension: Secondary | ICD-10-CM | POA: Diagnosis present

## 2023-12-28 DIAGNOSIS — Z951 Presence of aortocoronary bypass graft: Secondary | ICD-10-CM

## 2023-12-28 DIAGNOSIS — I255 Ischemic cardiomyopathy: Secondary | ICD-10-CM | POA: Diagnosis present

## 2023-12-28 DIAGNOSIS — I2510 Atherosclerotic heart disease of native coronary artery without angina pectoris: Secondary | ICD-10-CM | POA: Diagnosis present

## 2023-12-28 DIAGNOSIS — Z79899 Other long term (current) drug therapy: Secondary | ICD-10-CM

## 2023-12-28 DIAGNOSIS — Z87442 Personal history of urinary calculi: Secondary | ICD-10-CM

## 2023-12-28 DIAGNOSIS — Z7982 Long term (current) use of aspirin: Secondary | ICD-10-CM

## 2023-12-28 DIAGNOSIS — I110 Hypertensive heart disease with heart failure: Secondary | ICD-10-CM | POA: Diagnosis present

## 2023-12-28 DIAGNOSIS — Z952 Presence of prosthetic heart valve: Principal | ICD-10-CM

## 2023-12-28 DIAGNOSIS — I214 Non-ST elevation (NSTEMI) myocardial infarction: Secondary | ICD-10-CM | POA: Diagnosis present

## 2023-12-28 DIAGNOSIS — Z006 Encounter for examination for normal comparison and control in clinical research program: Secondary | ICD-10-CM

## 2023-12-28 DIAGNOSIS — Z9841 Cataract extraction status, right eye: Secondary | ICD-10-CM

## 2023-12-28 DIAGNOSIS — Z7984 Long term (current) use of oral hypoglycemic drugs: Secondary | ICD-10-CM

## 2023-12-28 DIAGNOSIS — Z8249 Family history of ischemic heart disease and other diseases of the circulatory system: Secondary | ICD-10-CM

## 2023-12-28 DIAGNOSIS — Z91041 Radiographic dye allergy status: Secondary | ICD-10-CM

## 2023-12-28 DIAGNOSIS — N400 Enlarged prostate without lower urinary tract symptoms: Secondary | ICD-10-CM | POA: Diagnosis present

## 2023-12-28 DIAGNOSIS — Z9079 Acquired absence of other genital organ(s): Secondary | ICD-10-CM

## 2023-12-28 DIAGNOSIS — R634 Abnormal weight loss: Secondary | ICD-10-CM | POA: Diagnosis present

## 2023-12-28 DIAGNOSIS — I4891 Unspecified atrial fibrillation: Secondary | ICD-10-CM | POA: Diagnosis present

## 2023-12-28 DIAGNOSIS — E785 Hyperlipidemia, unspecified: Secondary | ICD-10-CM | POA: Diagnosis present

## 2023-12-28 DIAGNOSIS — K219 Gastro-esophageal reflux disease without esophagitis: Secondary | ICD-10-CM | POA: Diagnosis present

## 2023-12-28 DIAGNOSIS — K869 Disease of pancreas, unspecified: Secondary | ICD-10-CM | POA: Diagnosis present

## 2023-12-28 DIAGNOSIS — R001 Bradycardia, unspecified: Secondary | ICD-10-CM | POA: Diagnosis present

## 2023-12-28 DIAGNOSIS — I252 Old myocardial infarction: Secondary | ICD-10-CM

## 2023-12-28 DIAGNOSIS — Z888 Allergy status to other drugs, medicaments and biological substances status: Secondary | ICD-10-CM

## 2023-12-28 DIAGNOSIS — Z6826 Body mass index (BMI) 26.0-26.9, adult: Secondary | ICD-10-CM

## 2023-12-28 DIAGNOSIS — Z604 Social exclusion and rejection: Secondary | ICD-10-CM | POA: Diagnosis present

## 2023-12-28 DIAGNOSIS — E119 Type 2 diabetes mellitus without complications: Secondary | ICD-10-CM | POA: Diagnosis present

## 2023-12-28 DIAGNOSIS — R0609 Other forms of dyspnea: Secondary | ICD-10-CM | POA: Diagnosis present

## 2023-12-28 DIAGNOSIS — Z9842 Cataract extraction status, left eye: Secondary | ICD-10-CM

## 2023-12-28 DIAGNOSIS — Z961 Presence of intraocular lens: Secondary | ICD-10-CM | POA: Diagnosis present

## 2023-12-28 HISTORY — PX: INTRAOPERATIVE TRANSTHORACIC ECHOCARDIOGRAM: SHX6523

## 2023-12-28 LAB — GLUCOSE, CAPILLARY
Glucose-Capillary: 289 mg/dL — ABNORMAL HIGH (ref 70–99)
Glucose-Capillary: 248 mg/dL — ABNORMAL HIGH (ref 70–99)
Glucose-Capillary: 221 mg/dL — ABNORMAL HIGH (ref 70–99)
Glucose-Capillary: 216 mg/dL — ABNORMAL HIGH (ref 70–99)
Glucose-Capillary: 216 mg/dL — ABNORMAL HIGH (ref 70–99)
Glucose-Capillary: 235 mg/dL — ABNORMAL HIGH (ref 70–99)

## 2023-12-28 LAB — ECHOCARDIOGRAM LIMITED
AR max vel: 2.03 cm2
AV Area VTI: 2.24 cm2
AV Area mean vel: 2.05 cm2
AV Mean grad: 3 mmHg
AV Peak grad: 5.8 mmHg
Ao pk vel: 1.2 m/s
MV VTI: 1.7 cm2

## 2023-12-28 LAB — POCT ACTIVATED CLOTTING TIME
Activated Clotting Time: 174 s
Activated Clotting Time: 337 s

## 2023-12-28 SURGERY — TRANSCATHETER AORTIC VALVE REPLACEMENT, TRANSFEMORAL (CATHLAB)
Anesthesia: Monitor Anesthesia Care

## 2023-12-28 MED ORDER — ACETAMINOPHEN 325 MG PO TABS
650.0000 mg | ORAL_TABLET | Freq: Four times a day (QID) | ORAL | Status: DC | PRN
  Administered 2023-12-29: 650 mg via ORAL
  Filled 2023-12-28: qty 2

## 2023-12-28 MED ORDER — CLOPIDOGREL BISULFATE 75 MG PO TABS
75.0000 mg | ORAL_TABLET | Freq: Every day | ORAL | Status: DC
  Administered 2023-12-28 – 2023-12-29 (×2): 75 mg via ORAL
  Filled 2023-12-28 (×2): qty 1

## 2023-12-28 MED ORDER — PROPOFOL 10 MG/ML IV BOLUS: INTRAVENOUS | Status: DC | PRN

## 2023-12-28 MED ORDER — ONDANSETRON HCL 4 MG/2ML IJ SOLN: 4.0000 mg | Freq: Four times a day (QID) | INTRAMUSCULAR | Status: DC | PRN

## 2023-12-28 MED ORDER — ONDANSETRON HCL 4 MG/2ML IJ SOLN
INTRAMUSCULAR | Status: DC | PRN
  Administered 2023-12-28: 4 mg via INTRAVENOUS

## 2023-12-28 MED ORDER — INSULIN ASPART 100 UNIT/ML IJ SOLN
0.0000 [IU] | INTRAMUSCULAR | Status: DC | PRN
  Administered 2023-12-28: 4 [IU] via SUBCUTANEOUS

## 2023-12-28 MED ORDER — CHLORHEXIDINE GLUCONATE 4 % EX SOLN: 30.0000 mL | CUTANEOUS | Status: DC

## 2023-12-28 MED ORDER — TAMSULOSIN HCL 0.4 MG PO CAPS
0.4000 mg | ORAL_CAPSULE | Freq: Two times a day (BID) | ORAL | Status: DC
  Administered 2023-12-28 – 2023-12-31 (×6): 0.4 mg via ORAL
  Filled 2023-12-28 (×6): qty 1

## 2023-12-28 MED ORDER — OXYCODONE HCL 5 MG PO TABS: 5.0000 mg | ORAL_TABLET | ORAL | Status: DC | PRN

## 2023-12-28 MED ORDER — HEPARIN (PORCINE) IN NACL 2000-0.9 UNIT/L-% IV SOLN
INTRAVENOUS | Status: DC | PRN
  Administered 2023-12-28: 1000 mL

## 2023-12-28 MED ORDER — REMIFENTANIL HCL 1 MG IV SOLR
INTRAVENOUS | Status: DC | PRN
  Administered 2023-12-28: .02 ug/kg/min via INTRAVENOUS

## 2023-12-28 MED ORDER — IODIXANOL 320 MG/ML IV SOLN
INTRAVENOUS | Status: DC | PRN
  Administered 2023-12-28: 40 mL

## 2023-12-28 MED ORDER — CHLORHEXIDINE GLUCONATE 4 % EX SOLN: 60.0000 mL | Freq: Once | CUTANEOUS | Status: DC

## 2023-12-28 MED ORDER — VERAPAMIL HCL 2.5 MG/ML IV SOLN
INTRAVENOUS | Status: AC
  Filled 2023-12-28: qty 2

## 2023-12-28 MED ORDER — ORAL CARE MOUTH RINSE: 15.0000 mL | OROMUCOSAL | Status: DC | PRN

## 2023-12-28 MED ORDER — INSULIN ASPART 100 UNIT/ML IJ SOLN
0.0000 [IU] | Freq: Three times a day (TID) | INTRAMUSCULAR | Status: DC
  Administered 2023-12-28 (×2): 8 [IU] via SUBCUTANEOUS
  Administered 2023-12-29 (×2): 2 [IU] via SUBCUTANEOUS
  Administered 2023-12-29: 12 [IU] via SUBCUTANEOUS
  Administered 2023-12-30: 8 [IU] via SUBCUTANEOUS
  Administered 2023-12-30: 4 [IU] via SUBCUTANEOUS
  Administered 2023-12-30: 12 [IU] via SUBCUTANEOUS
  Administered 2023-12-30: 8 [IU] via SUBCUTANEOUS
  Administered 2023-12-31: 4 [IU] via SUBCUTANEOUS
  Administered 2023-12-31: 20 [IU] via SUBCUTANEOUS
  Filled 2023-12-28: qty 12
  Filled 2023-12-28 (×2): qty 8
  Filled 2023-12-28 (×2): qty 2
  Filled 2023-12-28: qty 8
  Filled 2023-12-28: qty 20
  Filled 2023-12-28: qty 8
  Filled 2023-12-28 (×2): qty 4

## 2023-12-28 MED ORDER — CEFAZOLIN SODIUM-DEXTROSE 2-4 GM/100ML-% IV SOLN
2.0000 g | Freq: Three times a day (TID) | INTRAVENOUS | Status: AC
  Administered 2023-12-28 (×2): 2 g via INTRAVENOUS
  Filled 2023-12-28 (×2): qty 100

## 2023-12-28 MED ORDER — BRIMONIDINE TARTRATE 0.2 % OP SOLN
1.0000 [drp] | Freq: Three times a day (TID) | OPHTHALMIC | Status: DC
  Administered 2023-12-28 – 2023-12-31 (×8): 1 [drp] via OPHTHALMIC
  Filled 2023-12-28 (×2): qty 5

## 2023-12-28 MED ORDER — ACETAMINOPHEN 650 MG RE SUPP: 650.0000 mg | Freq: Four times a day (QID) | RECTAL | Status: DC | PRN

## 2023-12-28 MED ORDER — MORPHINE SULFATE (PF) 2 MG/ML IV SOLN: 1.0000 mg | INTRAVENOUS | Status: DC | PRN

## 2023-12-28 MED ORDER — SODIUM CHLORIDE 0.9 % IV SOLN
0.0125 ug/kg/min | INTRAVENOUS | Status: DC
  Filled 2023-12-28: qty 1000

## 2023-12-28 MED ORDER — ATORVASTATIN CALCIUM 80 MG PO TABS
80.0000 mg | ORAL_TABLET | Freq: Every day | ORAL | Status: DC
  Administered 2023-12-28 – 2023-12-31 (×4): 80 mg via ORAL
  Filled 2023-12-28 (×4): qty 1

## 2023-12-28 MED ORDER — ASPIRIN 81 MG PO TBEC
81.0000 mg | DELAYED_RELEASE_TABLET | Freq: Every day | ORAL | Status: DC
  Administered 2023-12-28 – 2023-12-31 (×4): 81 mg via ORAL
  Filled 2023-12-28 (×4): qty 1

## 2023-12-28 MED ORDER — HEPARIN (PORCINE) IN NACL 1000-0.9 UT/500ML-% IV SOLN
INTRAVENOUS | Status: DC | PRN
  Administered 2023-12-28: 500 mL

## 2023-12-28 MED ORDER — NITROGLYCERIN IN D5W 200-5 MCG/ML-% IV SOLN: 0.0000 ug/min | INTRAVENOUS | Status: DC

## 2023-12-28 MED ORDER — FLUTICASONE PROPIONATE 50 MCG/ACT NA SUSP
1.0000 | Freq: Every day | NASAL | Status: DC | PRN
  Filled 2023-12-28: qty 16

## 2023-12-28 MED ORDER — DIPHENHYDRAMINE HCL 25 MG PO CAPS
50.0000 mg | ORAL_CAPSULE | ORAL | Status: AC
  Administered 2023-12-28: 50 mg via ORAL
  Filled 2023-12-28: qty 2

## 2023-12-28 MED ORDER — SODIUM CHLORIDE 0.9% FLUSH: 3.0000 mL | INTRAVENOUS | Status: DC | PRN

## 2023-12-28 MED ORDER — INSULIN ASPART 100 UNIT/ML IJ SOLN
INTRAMUSCULAR | Status: AC
  Filled 2023-12-28: qty 4

## 2023-12-28 MED ORDER — SODIUM CHLORIDE 0.9% FLUSH
3.0000 mL | Freq: Two times a day (BID) | INTRAVENOUS | Status: DC
  Administered 2023-12-28 – 2023-12-30 (×5): 3 mL via INTRAVENOUS

## 2023-12-28 MED ORDER — SODIUM CHLORIDE 0.9 % IV SOLN: INTRAVENOUS | Status: AC

## 2023-12-28 MED ORDER — PROTAMINE SULFATE 10 MG/ML IV SOLN
INTRAVENOUS | Status: DC | PRN
  Administered 2023-12-28: 75 mg via INTRAVENOUS

## 2023-12-28 MED ORDER — CHLORHEXIDINE GLUCONATE 0.12 % MT SOLN
15.0000 mL | Freq: Once | OROMUCOSAL | Status: AC
  Administered 2023-12-28: 15 mL via OROMUCOSAL
  Filled 2023-12-28: qty 15

## 2023-12-28 MED ORDER — PREDNISONE 50 MG PO TABS
50.0000 mg | ORAL_TABLET | Freq: Once | ORAL | Status: AC
  Administered 2023-12-28: 50 mg via ORAL
  Filled 2023-12-28 (×3): qty 1

## 2023-12-28 MED ORDER — VERAPAMIL HCL 2.5 MG/ML IV SOLN
INTRAVENOUS | Status: DC | PRN
  Administered 2023-12-28: 10 mL via INTRA_ARTERIAL

## 2023-12-28 MED ORDER — CHLORHEXIDINE GLUCONATE CLOTH 2 % EX PADS
6.0000 | MEDICATED_PAD | Freq: Every day | CUTANEOUS | Status: DC
  Administered 2023-12-28 – 2023-12-30 (×3): 6 via TOPICAL

## 2023-12-28 MED ORDER — HEPARIN SODIUM (PORCINE) 1000 UNIT/ML IJ SOLN
INTRAMUSCULAR | Status: DC | PRN
  Administered 2023-12-28: 13000 [IU] via INTRAVENOUS
  Administered 2023-12-28: 6000 [IU] via INTRAVENOUS

## 2023-12-28 MED ORDER — EZETIMIBE 10 MG PO TABS
10.0000 mg | ORAL_TABLET | Freq: Every day | ORAL | Status: DC
  Administered 2023-12-28 – 2023-12-30 (×3): 10 mg via ORAL
  Filled 2023-12-28 (×3): qty 1

## 2023-12-28 MED ORDER — SODIUM CHLORIDE 0.9 % IV SOLN: 250.0000 mL | INTRAVENOUS | Status: DC | PRN

## 2023-12-28 MED ORDER — BRINZOLAMIDE 1 % OP SUSP
1.0000 [drp] | Freq: Three times a day (TID) | OPHTHALMIC | Status: DC
  Administered 2023-12-28 – 2023-12-31 (×8): 1 [drp] via OPHTHALMIC
  Filled 2023-12-28 (×2): qty 10

## 2023-12-28 MED ORDER — SODIUM CHLORIDE 0.9 % IV SOLN: INTRAVENOUS | Status: DC | PRN

## 2023-12-28 MED ORDER — NOREPINEPHRINE 4 MG/250ML-% IV SOLN: 0.0000 ug/min | INTRAVENOUS | Status: DC

## 2023-12-28 MED ORDER — TRAMADOL HCL 50 MG PO TABS: 50.0000 mg | ORAL_TABLET | ORAL | Status: DC | PRN

## 2023-12-28 MED ORDER — SODIUM CHLORIDE 0.9 % IV SOLN: INTRAVENOUS | Status: DC

## 2023-12-28 MED ORDER — LIDOCAINE HCL (PF) 1 % IJ SOLN
INTRAMUSCULAR | Status: AC
  Filled 2023-12-28: qty 30

## 2023-12-28 MED ORDER — LIDOCAINE HCL (PF) 1 % IJ SOLN
INTRAMUSCULAR | Status: DC | PRN
  Administered 2023-12-28: 5 mL via INTRADERMAL
  Administered 2023-12-28: 10 mL via INTRADERMAL
  Administered 2023-12-28: 2 mL via INTRADERMAL

## 2023-12-28 SURGICAL SUPPLY — 30 items
BAG SNAP BAND KOVER 36X36 (MISCELLANEOUS) ×4 IMPLANT
CABLE ADAPT PACING TEMP 12FT (ADAPTER) IMPLANT
CATH 26 ULTRA DELIVERY (CATHETERS) IMPLANT
CATH DIAG 6FR PIGTAIL ANGLED (CATHETERS) IMPLANT
CATH INFINITI 5FR ANG PIGTAIL (CATHETERS) IMPLANT
CATH INFINITI 6F AL2 (CATHETERS) IMPLANT
CLOSURE PERCLOSE PROSTYLE (Vascular Products) IMPLANT
CRIMPER (MISCELLANEOUS) IMPLANT
DEVICE INFLATION ATRION QL2530 (MISCELLANEOUS) IMPLANT
DEVICE RAD COMP TR BAND LRG (VASCULAR PRODUCTS) IMPLANT
ELECT DEFIB PAD ADLT CADENCE (PAD) IMPLANT
GLIDESHEATH SLEND SS 6F .021 (SHEATH) IMPLANT
KIT MICROPUNCTURE NIT STIFF (SHEATH) IMPLANT
KIT SAPIAN 3 ULTRA RESILIA 26 (Valve) IMPLANT
PACK CARDIAC CATHETERIZATION (CUSTOM PROCEDURE TRAY) ×2 IMPLANT
SET ATX-X65L (MISCELLANEOUS) IMPLANT
SHEATH INTRODUCER SET 20-26 (SHEATH) IMPLANT
SHEATH PINNACLE 6F 10CM (SHEATH) IMPLANT
SHEATH PINNACLE 8F 10CM (SHEATH) IMPLANT
SHIELD CATH-GARD CONTAMINATION (MISCELLANEOUS) IMPLANT
STOPCOCK MORSE 400PSI 3WAY (MISCELLANEOUS) ×4 IMPLANT
TRANSDUCER W/STOPCOCK (MISCELLANEOUS) IMPLANT
TUBING ART PRESS 72 MALE/FEM (TUBING) IMPLANT
WIRE AMPLATZ SS-J .035X180CM (WIRE) IMPLANT
WIRE EMERALD 3MM-J .035X150CM (WIRE) IMPLANT
WIRE EMERALD 3MM-J .035X260CM (WIRE) IMPLANT
WIRE EMERALD ST .035X260CM (WIRE) IMPLANT
WIRE MICROINTRODUCER 60CM (WIRE) IMPLANT
WIRE PACING TEMP ST TIP 5 (CATHETERS) IMPLANT
WIRE SAFARI SM CURVE 275 (WIRE) IMPLANT

## 2023-12-28 NOTE — Anesthesia Procedure Notes (Signed)
 Procedure Name: MAC Date/Time: 12/28/2023 12:05 PM  Performed by: Jru Pense L, CRNAPre-anesthesia Checklist: Patient identified, Emergency Drugs available, Suction available, Patient being monitored and Timeout performed Patient Re-evaluated:Patient Re-evaluated prior to induction Oxygen Delivery Method: Simple face mask

## 2023-12-28 NOTE — Anesthesia Postprocedure Evaluation (Signed)
 Anesthesia Post Note  Patient: Sean Villarreal  Procedure(s) Performed: Transcatheter Aortic Valve Replacement, Transfemoral (Left) ECHOCARDIOGRAM, TRANSTHORACIC     Patient location during evaluation: PACU Anesthesia Type: MAC Level of consciousness: awake and alert Pain management: pain level controlled Vital Signs Assessment: post-procedure vital signs reviewed and stable Respiratory status: spontaneous breathing, nonlabored ventilation, respiratory function stable and patient connected to nasal cannula oxygen Cardiovascular status: stable and blood pressure returned to baseline Postop Assessment: no apparent nausea or vomiting Anesthetic complications: no   There were no known notable events for this encounter.  Last Vitals:  Vitals:   12/28/23 1418 12/28/23 1430  BP:    Pulse:  81  Resp:  14  Temp: 37 C   SpO2:  95%    Last Pain:  Vitals:   12/28/23 1418  TempSrc: Oral  PainSc:                  [name]

## 2023-12-28 NOTE — Discharge Summary (Incomplete)
 [facility]   MULTIDISCIPLINARY HEART VALVE TEAM  Discharge Summary    Patient ID: DEMONTA WOMBLES MRN: [id number]; DOB: [birth year]  Admit date: 12/28/2023 Discharge date: 12/31/2023  PCP:  Vida Mardy DEL, PA-C  [facility] Cardiologist:  Jayson Sierras, MD  [facility] Structural heart: Ozell Fell, MD [facility] Electrophysiologist:  Danelle Birmingham, MD   Discharge Diagnoses    Principal Problem:   S/P TAVR (transcatheter aortic valve replacement) Active Problems:   Coronary artery disease   Type II diabetes mellitus (HCC)   Hypertension   Dyslipidemia   GERD (gastroesophageal reflux disease)   Severe aortic stenosis   CHB (complete heart block) (HCC)   Allergies Allergies  Allergen Reactions   Contrast Media [Iodinated Contrast Media] Hives   Jardiance  [Empagliflozin ] Diarrhea   Penicillins Other (See Comments)    Syncope  Childhood reaction    Diagnostic Studies/Procedures    TAVR OPERATIVE NOTE     Date of Procedure:                12/28/2023   Preoperative Diagnosis:      Severe Aortic Stenosis    Postoperative Diagnosis:    Same    Procedure:        Transcatheter Aortic Valve Replacement - Percutaneous  Transfemoral Approach             Edwards Sapien 3 Ultra Resilia (size 26 mm, model # [id number] , serial # [id number])              Co-Surgeons:                        Con Clunes, MD and Ozell Fell, MD    Anesthesiologist:                  Erma, MD   Echocardiographer:              Delford, MD   Pre-operative Echo Findings: Severe aortic stenosis Normal left ventricular systolic function   Post-operative Echo Findings: No paravalvular leak _____________    Echo 12/29/23:  IMPRESSIONS   1. Left ventricular ejection fraction, by estimation, is 60 to 65%. Left  ventricular ejection fraction by 2D MOD biplane is 60.6 %. The left  ventricle has normal function. The left ventricle has no regional wall  motion  abnormalities. Left ventricular  diastolic function could not be evaluated.   2. Right ventricular systolic function is normal. The right ventricular  size is moderately enlarged.   3. The mitral valve is normal in structure. Mild mitral valve  regurgitation. No evidence of mitral stenosis. Moderate mitral annular  calcification.   4. Prosthetic valve well seated with normal leaflet motion and normal  function (PV 1.8 m/sec, MG 7 mmHg, DVI 0.5) and no evidence of  transvalvular or paravalvular regurgitation. The aortic valve has been  repaired/replaced. Aortic valve regurgitation is  not visualized. No aortic stenosis is present. There is a 26 mm Edwards  S3U valve present in the aortic position. Procedure Date: 12/28/23.   5. The inferior vena cava is dilated in size with <50% respiratory  variability, suggesting right atrial pressure of 15 mmHg.   Comparison(s): Prior images reviewed side by side. S/p TAVR. EF has  improved but RV now seems enlarged and IVC dilated.    History of Present Illness     Sean Villarreal is a 83 y.o. male with a history of ischemic cardiomyopathy with  HFrEF, extensive CAD with 2v CABG in 2021, HTN, DMT2, BPH, GERD, OA, bifascicular block (RBBB/LAFB), symptomatic inguinal hernia and mod-sev aortic stenosis who presented to [facility] 12/28[facility]23 for planned TAVR.   He had CAD s/p DES to LAD and LCx in 04/2012, PTCA to ostial LCx in 03/2016 due to ISR, DES to ostial LCx in 09/2016 due to ISR, patent stents by repeat cath in 03/2018, ultimately underwent CABG in 12/2019 with LIMA to LAD and SVG to OM, NSTEMI in 09/2023 with DES to RCA. He was then admitted at the end of 10/2023 with recurrent NSTEMI without evidence of new occlusion on LHC but concerns for LFLG aortic stenosis. Echo 11/19/23 showed EF 50%, and LFLG AS with mean grad 18 mmHg, Vmax 2.74 m/s, AVA 0.86 cm2, DVI 0.27, SVI 26, and mild MR.  The patient was evaluated by the multidisciplinary valve team and felt  to have severe, symptomatic aortic stenosis and to be a suitable candidate for TAVR, which was set up for 12/28/23.   Hospital Course     Consultants: EP   Severe AS:  -- S/p TAVR with a 26 mm Edwards Sapien 3 Ultra Resilia  THV via the TF approach on 12/28/23.  -- Post operative echo showed EF 60%, mod RVE, mod MAC with mild MR, normally functioning TAVR with a mean gradient of 7 mmHg and no PVL.  -- Groin sites are stable.  -- Continue Asprin 81mg  daily and Plavix  75mg  daily given recent PCI (Plavix  held for [facility] implant and recommended by Dr. Waddell to restart on 01/04/2024) -- Met with cardiac rehab to discuss CRP phase II.  -- Plan for discharge home today with close follow up in the outpatient setting.   Bifascicular block (RBBB/LAFB): -- Had CHB perioperatively.  -- S/p successful implantation of Biotronik dual-chamber PPM on 12/30/2023 by Dr. Waddell. Follow-up CXR showed no evidence of pneumothorax.   CAD/HLD: -- He is s/p DES to LAD and LCx in 04/2012, PTCA to ostial LCx in 03/2016 due to ISR, DES to ostial LCx in 09/2016 due to ISR, patent stents by repeat cath in 03/2018, ultimately underwent CABG in 12/2019 with LIMA to LAD and SVG to OM. Most recent event was an NSTEMI in 09/2023 with DES to RCA. Repeat cath in 10/2023 showed chronic occlusion of the proximal LAD but patent LIMA to LAD and chronic occlusion of the LCx with patent vein graft to the OM and RCA stent was patent.   -- Continue Asprin 81mg  daily and Plavix  75mg  daily given recent PCI. Plavix  held for [facility] implant-- okay to resume on 01/04/2024 per Dr. Waddell. -- Continue Coreg  12.5mg  BID, Atorvastatin  80mg  daily and Zetia  10mg  daily.  LDL was at 46 in 10/2023.  HFimpEF: -- EF at 45% by echo in 09/2023, EF at 60-65% by repeat echo this admission.  -- Appears euvolemic.  -- Continue home Lasix  40mg  daily/KCL 20 meq. -- Resume Spiro 12.5mg  daily, Coreg  12.5mg  BID and Farixga 10mg  daily.  DMT2:  -- Treated with SSI  while admitted.  -- Resume home meds at discharge.  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 -- Will refer to GI  to follow over time.  _____________  Discharge Vitals Blood pressure (!) 118/93, pulse 75, temperature 98.4 F (36.9 C), temperature source Oral, resp. rate 18, height 5' 9 (1.753 m), weight 81.6 kg, SpO2 97%.  Filed Weights   12/29/23 0600 12/30/23 0500 12/31/23 0254  Weight: 82 kg 82.6 kg 81.6 kg     Disposition   Pt is being discharged home today in good condition.  Follow-up Plans & Appointments     Follow-up Information     Debera Jayson MATSU, MD. Go on 01/04/2024.   Specialty: Cardiology Why: @ 10:40am, please arrive at least 10 minutes early. Contact information: [address] [place] [address] [phone]                Discharge Instructions     Diet - low sodium heart healthy   Complete by: As directed    Discharge wound care:   Complete by: As directed    Refer to AVS       Discharge Medications   Allergies as of 12/31/2023       Reactions   Contrast Media [iodinated Contrast Media] Hives   Jardiance  [empagliflozin ] Diarrhea   Penicillins Other (See Comments)   Syncope  Childhood reaction        Medication List     PAUSE taking these medications    clopidogrel  75 MG tablet Wait to take this until: January 04, 2024 Morning Commonly known as: PLAVIX  Take 1 tablet (75 mg total) by mouth daily.       STOP taking these medications    metoprolol  tartrate 25 MG tablet Commonly known as: LOPRESSOR        TAKE these medications    acetaminophen  500 MG tablet Commonly known as:  TYLENOL  Take 1,000 mg by mouth every 6 (six) hours as needed for fever or headache (pain).   aspirin  EC 81 MG tablet Take 81 mg by mouth daily.   atorvastatin  80 MG tablet Commonly known as: LIPITOR  Take 1 tablet (80 mg total) by mouth daily.   carvedilol  12.5 MG tablet Commonly known as: COREG  Take 1 tablet (12.5 mg total) by mouth 2 (two) times daily.   ezetimibe  10 MG tablet Commonly known as: ZETIA  Take 1 tablet (10 mg total) by mouth at bedtime.   Farxiga  10 MG Tabs tablet Generic drug: dapagliflozin  propanediol Take 1 tablet (10 mg total) by mouth daily before breakfast.   fluticasone  50 MCG/ACT nasal spray Commonly known as: FLONASE  Place 1 spray into both nostrils daily as needed for allergies or rhinitis.   furosemide  40 MG tablet Commonly known as: LASIX  Take 1 tablet (40 mg total) by mouth daily.   glimepiride  1 MG tablet Commonly known as: AMARYL  Take 1 tablet (1 mg total) by mouth daily.   Klor-Con  M20 20 MEQ tablet Generic drug: potassium chloride  SA Take 1 tablet (20 mEq total) by mouth daily.   latanoprost  0.005 % ophthalmic solution Commonly known as: XALATAN  Place 1 drop into the left eye at bedtime.   metFORMIN  1000 MG tablet Commonly known as: GLUCOPHAGE  Take 1 tablet (1,000 mg total) by mouth 2 (two) times daily.   nitroGLYCERIN  0.4 MG SL tablet Commonly known as: NITROSTAT  Place 0.4 mg under the tongue every 5 (five) minutes x 3 doses as needed for chest pain.   Simbrinza 1-0.2 % Susp Generic drug: Brinzolamide -Brimonidine  Place 1 drop into the left eye 2 (two) times daily.   spironolactone  25 MG tablet Commonly known as:  ALDACTONE  Take 0.5 tablets (12.5 mg total) by mouth daily.   tamsulosin  0.4 MG Caps capsule Commonly known as: FLOMAX  Take 1 capsule (0.4 mg total) by mouth 2 (two) times daily.   Trulicity  0.75 MG/0.5ML Soaj Generic drug: Dulaglutide  Inject 0.75 mg as directed once a week. What changed: when to take this                Discharge Care Instructions  (From admission, onward)           Start     Ordered   12/31/23 0000  Discharge wound care:       Comments: Refer to AVS   12/31/23 1206                 Outstanding Labs/Studies   BMET to recheck renal function and potassium.   Duration of Discharge Encounter: APP Time: 34 minutes    Signed, Laymon CHRISTELLA Qua, PA-C 12/31/2023, 12:35 PM [phone]   EP Attending  He is doing well after PPM insertion. His PM interrogation demonstrates normal DDD PM function. CXR looks good. He is stable for DC home with usual followup.  Danelle Kayleen Alig,MD

## 2023-12-28 NOTE — Op Note (Signed)
 HEART AND VASCULAR CENTER   MULTIDISCIPLINARY HEART VALVE TEAM   TAVR OPERATIVE NOTE   Date of Procedure:  12/28/2023  Preoperative Diagnosis: Severe Aortic Stenosis   Postoperative Diagnosis: Same   Procedure:   Transcatheter Aortic Valve Replacement - Percutaneous Transfemoral Approach  Edwards Sapien 3 Ultra Resilia THV (size 26 mm, serial # [id number])   Co-Surgeons:  Con Clunes, MD and Ozell Fell, MD  Anesthesiologist:  Thom Peoples, MD  Echocardiographer:  Jerel Balding, MD  Pre-operative Echo Findings: Severe aortic stenosis Normal left ventricular systolic function  Post-operative Echo Findings: No paravalvular leak Normal left ventricular systolic function  BRIEF CLINICAL NOTE AND INDICATIONS FOR SURGERY  83 year old male with history of CAD status post CABG who presented with non-STEMI in the setting of severe low-flow low gradient aortic stenosis.  He was evaluated for TAVR and presents today as an outpatient to move forward with transfemoral TAVR after undergoing multidisciplinary heart team review and found to have patent bypass grafts and suitable anatomy for transfemoral TAVR.  During the course of the patient's preoperative work up they have been evaluated comprehensively by a multidisciplinary team of specialists coordinated through the Multidisciplinary Heart Valve Clinic in the [facility].  They have been demonstrated to suffer from symptomatic severe aortic stenosis as noted above. The patient has been counseled extensively as to the relative risks and benefits of all options for the treatment of severe aortic stenosis including long term medical therapy, conventional surgery for aortic valve replacement, and transcatheter aortic valve replacement.  The patient has been independently evaluated in formal cardiac surgical consultation by Dr Clunes, who deemed the patient appropriate for TAVR. Based upon review of all of the patient's  preoperative diagnostic tests they are felt to be candidate for transcatheter aortic valve replacement using the transfemoral approach as an alternative to conventional surgery.    Following the decision to proceed with transcatheter aortic valve replacement, a discussion has been held regarding what types of management strategies would be attempted intraoperatively in the event of life-threatening complications, including whether or not the patient would be considered a candidate for the use of cardiopulmonary bypass and/or conversion to open sternotomy for attempted surgical intervention.  The patient has been advised of a variety of complications that might develop peculiar to this approach including but not limited to risks of death, stroke, paravalvular leak, aortic dissection or other major vascular complications, aortic annulus rupture, device embolization, cardiac rupture or perforation, acute myocardial infarction, arrhythmia, heart block or bradycardia requiring permanent pacemaker placement, congestive heart failure, respiratory failure, renal failure, pneumonia, infection, other late complications related to structural valve deterioration or migration, or other complications that might ultimately cause a temporary or permanent loss of functional independence or other long term morbidity.  The patient provides full informed consent for the procedure as described and all questions were answered preoperatively.  DETAILS OF THE OPERATIVE PROCEDURE  PREPARATION:   The patient is brought to the operating room on the above mentioned date and central monitoring was established by the anesthesia team including placement of a radial arterial line. The patient is placed in the supine position on the operating table.  Intravenous antibiotics are administered. The patient is monitored closely throughout the procedure under conscious sedation.  Baseline transthoracic echocardiogram is performed. The patient's  chest, abdomen, both groins, and both lower extremities are prepared and draped in a sterile manner. A time out procedure is performed.   PERIPHERAL ACCESS:   Using ultrasound  guidance, left radial artery access was obtained and a 5/6 French slender sheath was inserted.  Verapamil  was administered through the sheath per protocol.  The right internal jugular vein was accessed with a micropuncture technique and ultrasound guidance for placement of a 6 French sheath and a temporary transvenous pacing wire was advanced into the RV apex.  Right internal jugular access was chosen as the patient has a high risk of post TAVR AV block in the setting of baseline bifascicular block.  US  images are digitally captured and stored in the patient's chart. A pigtail diagnostic catheter was passed through the radial arterial sheath under fluoroscopic guidance into the aortic root. The pacemaker was tested to ensure stable lead placement and pacemaker capture. Aortic root angiography was performed in order to determine the optimal angiographic angle for valve deployment.  TRANSFEMORAL ACCESS:  A micropuncture technique is used to access the left femoral artery under fluoroscopic and ultrasound guidance.  2 Perclose devices are deployed at 10' and 2' positions to 'PreClose' the femoral artery. An 8 French sheath is placed and then an Amplatz Superstiff wire is advanced through the sheath. This is changed out for a 14 French transfemoral E-Sheath after progressively dilating over the Superstiff wire.  An AL-2 catheter was used to direct a straight-tip exchange length wire across the native aortic valve into the left ventricle. This was exchanged out for a pigtail catheter and position was confirmed in the LV apex. Simultaneous LV and Ao pressures were recorded.  The pigtail catheter was exchanged for a Safari wire in the LV apex.  The patient developed complete AV block with the [facility] wire in the apex.  He is paced at 80 bpm  through the remainder of the procedure.  BALLOON AORTIC VALVULOPLASTY:  Not performed  TRANSCATHETER HEART VALVE DEPLOYMENT:  An Edwards Sapien 3 Ultra Resilia transcatheter heart valve (size 26 mm) was prepared and crimped per manufacturer's guidelines, and the proper orientation of the valve is confirmed on the [employer] delivery system. The valve was advanced through the introducer sheath using normal technique until in an appropriate position in the abdominal aorta beyond the sheath tip. The balloon was then retracted and using the fine-tuning wheel was centered on the valve. The valve was then advanced across the aortic arch using appropriate flexion of the catheter. The valve was carefully positioned across the aortic valve annulus. The Commander catheter was retracted using normal technique. Once final position of the valve has been confirmed by angiographic assessment, the valve is deployed while temporarily holding ventilation and during rapid ventricular pacing to maintain systolic blood pressure < 50 mmHg and pulse pressure < 10 mmHg. The balloon inflation is held for >3 seconds after reaching full deployment volume. Once the balloon has fully deflated the balloon is retracted into the ascending aorta and valve function is assessed using echocardiography. The patient's hemodynamic recovery following valve deployment is good.  The deployment balloon and guidewire are both removed. Echo demostrated acceptable post-procedural gradients, stable mitral valve function, and no aortic insufficiency.    PROCEDURE COMPLETION:  The sheath was removed and femoral artery closure is performed using the 2 previously deployed Perclose devices.  Protamine  is administered once femoral arterial repair was complete. The site is clear with no evidence of bleeding or hematoma after the sutures are tightened. The temporary pacemaker is secured in place.  The radial sheath is removed and a TR band placed for  hemostasis.  The patient tolerated the procedure well and  is transported to the recovery area in stable condition. There were no immediate intraoperative complications. All sponge instrument and needle counts are verified correct at completion of the operation.   The patient received a total of 40 mL of intravenous contrast during the procedure.  EBL: minimal  Complications: Complete AV block, with recovery of AV conduction at the completion of the procedure.   Ozell Fell, MD 12/28/2023 4:57 PM

## 2023-12-28 NOTE — Progress Notes (Signed)
  HEART AND VASCULAR CENTER   MULTIDISCIPLINARY HEART VALVE TEAM  Patient doing well s/p TAVR. He is hemodynamically stable. Groin sites stable. RIJ pacer in place- back up set to 40, not requiring pacing. Hrs in 70s. ECG not completed. Monitor rhythm overnight and removed internal jugular pacer if not recurrent CHB.  Sean Hummer PA-C  [facility]  Pager [phone]

## 2023-12-28 NOTE — Transfer of Care (Signed)
 Immediate Anesthesia Transfer of Care Note  Patient: Sean Villarreal  Procedure(s) Performed: Transcatheter Aortic Valve Replacement, Transfemoral (Left) ECHOCARDIOGRAM, TRANSTHORACIC  Patient Location: ICU  Anesthesia Type:MAC  Level of Consciousness: drowsy and patient cooperative  Airway & Oxygen Therapy: Patient Spontanous Breathing and Patient connected to nasal cannula oxygen  Post-op Assessment: Report given to RN, Post -op Vital signs reviewed and stable, and Patient moving all extremities X 4  Post vital signs: Reviewed and stable  Last Vitals:  See ICU flowsheet. VSS before leaving patient   Last Pain:  Vitals:   12/28/23 0825  TempSrc:   PainSc: 0-No pain      Patients Stated Pain Goal: 0 (12/28/23 0825)  Complications: There were no known notable events for this encounter.  BP communicated with Dr. Treen before leaving. Giving fluid.

## 2023-12-28 NOTE — Interval H&P Note (Signed)
 History and Physical Interval Note:  12/28/2023 11:46 AM  Sean Villarreal  has presented today for surgery, with the diagnosis of Severe Aortic Stenosis.  The various methods of treatment have been discussed with the patient and family. After consideration of risks, benefits and other options for treatment, the patient has consented to  Procedure(s): Transcatheter Aortic Valve Replacement, Transfemoral (Left) ECHOCARDIOGRAM, TRANSTHORACIC (N/A) as a surgical intervention.  The patient's history has been reviewed, patient examined, no change in status, stable for surgery.  I have reviewed the patient's chart and labs.  Questions were answered to the patient's satisfaction.     Ozell Fell

## 2023-12-28 NOTE — Op Note (Signed)
 HEART AND VASCULAR CENTER   MULTIDISCIPLINARY HEART VALVE TEAM   TAVR OPERATIVE NOTE   Date of Procedure:  12/28/2023  Preoperative Diagnosis: Severe Aortic Stenosis   Postoperative Diagnosis: Same   Procedure:   Transcatheter Aortic Valve Replacement - Percutaneous  Transfemoral Approach  Edwards Sapien 3 Ultra Resilia (size 26 mm, model # [id number] , serial # [id number])   Co-Surgeons:  Con Clunes, MD and Ozell Fell, MD   Anesthesiologist:  Erma, MD  Echocardiographer:  Delford, MD  Pre-operative Echo Findings: Severe aortic stenosis Normal left ventricular systolic function  Post-operative Echo Findings: No paravalvular leak Normal left ventricular systolic function   BRIEF CLINICAL NOTE AND INDICATIONS FOR SURGERY Sean Villarreal is a 83 y.o. male presents for evaluation of severe aortic stenosis and TAVR.  He also has a history of CAD and is s/p CABG x 2 (SVG - OM, LIMA - LAD in 2021 with Dr. Kerrin - all grafts open) and recently DES to RCA in September after NSTEMI.  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  He was then admitted at the end of October with recurrent NSTEMI without evidence of new occlusion on LHC but concerns for LFLG aortic stenosis.    DETAILS OF THE OPERATIVE PROCEDURE  PREPARATION:    The patient was brought to the operating room on the above mentioned date and appropriate monitoring was established by the anesthesia team. The patient was placed in the supine position on the operating table.  Intravenous antibiotics were administered. The patient was monitored closely throughout the procedure under conscious sedation.  Baseline transthoracic echocardiogram was performed. The patient's abdomen and both groins were prepped and draped in a sterile manner. A time out procedure was performed.   PERIPHERAL ACCESS:    Using the modified Seldinger technique, left radial arterial and right internal jugular vein access was obtained with  placement of 6 Fr sheaths.  A pigtail diagnostic catheter was passed through the left radial arterial sheath under fluoroscopic guidance into the aortic root.  A temporary transvenous pacemaker catheter was passed through the right internal jugular venous sheath under fluoroscopic guidance into the right ventricle.  The pacemaker was tested to ensure stable lead placement and pacemaker capture. Aortic root angiography was performed in order to determine the optimal angiographic angle for valve deployment.   TRANSFEMORAL ACCESS:   Percutaneous transfemoral access and sheath placement was performed using ultrasound guidance.  The left common femoral artery was cannulated using a micropuncture needle and appropriate location was verified using hand injection angiogram.  A pair of Abbott Perclose percutaneous closure devices were placed and a 6 French sheath replaced into the femoral artery.  The patient was heparinized systemically and ACT verified > 250 seconds.    A 14 Fr transfemoral E-sheath was introduced into the left common femoral artery after progressively dilating over an Amplatz superstiff wire. An AL-2 catheter was used to direct a straight-tip exchange length wire across the native aortic valve into the left ventricle. This was exchanged out for a pigtail catheter and position was confirmed in the LV apex. Simultaneous LV and Ao pressures were recorded.  The LVEDP was 11 mmHg.  The pigtail catheter was exchanged for a Safari wire in the LV apex.   TRANSCATHETER HEART VALVE DEPLOYMENT:   An Edwards Sapien 3 Ultra transcatheter heart valve (26 mm) was prepared and crimped per manufacturer's guidelines, and the proper orientation of the valve was confirmed on the [employer] delivery system. The valve  was advanced through the introducer sheath using normal technique until in an appropriate position in the abdominal aorta beyond the sheath tip. The balloon was then retracted and using the  fine-tuning wheel was centered on the valve. The valve was then advanced across the aortic arch using appropriate flexion of the catheter. The valve was carefully positioned across the aortic valve annulus. The Commander catheter was retracted using normal technique. Once final position of the valve was confirmed by angiographic assessment, the valve was deployed during rapid ventricular pacing to maintain systolic blood pressure < 50 mmHg and pulse pressure < 10 mmHg. The balloon inflation was held for >3 seconds after reaching full deployment volume. Once the balloon was fully deflated the balloon was retracted into the ascending aorta and valve function was assessed using echocardiography. There was felt to be no paravalvular leak and no central aortic insufficiency.  The patient's hemodynamic recovery following valve deployment was good.  The deployment balloon and guidewire were both removed.     PROCEDURE COMPLETION:   The sheath was removed and femoral artery closure performed.  Protamine  was administered once femoral arterial repair was complete. The temporary pacemaker was left in place as the patient had an irregular rhythm that was difficult to identify but appeared to be some type of heart block.  He was perfusing well so the temporary pacemaker was left at a VVI back up rate of 50.  The left radial pigtail catheter was removed an  TR band was placed.  The patient tolerated the procedure well and was transported to the cath lab recovery area in stable condition. There were no immediate intraoperative complications. All sponge instrument and needle counts were verified correct at completion of the operation.   No blood products were administered during the operation.  The patient received a total of 40 mL of intravenous contrast during the procedure.   Con GORMAN Clunes, MD 12/28/2023 2:40 PM

## 2023-12-28 NOTE — Anesthesia Preprocedure Evaluation (Addendum)
 Anesthesia Evaluation  Patient identified by MRN, date of birth, ID band Patient awake    Reviewed: Allergy & Precautions, H&P , NPO status , Patient's Chart, lab work & pertinent test results  History of Anesthesia Complications Negative for: history of anesthetic complications  Airway Mallampati: II  TM Distance: >3 FB Neck ROM: Full    Dental  (+) Edentulous Upper, Edentulous Lower   Pulmonary neg sleep apnea, former smoker   Pulmonary exam normal breath sounds clear to auscultation       Cardiovascular hypertension, (-) angina + CAD, + Past MI and + Cardiac Stents  Normal cardiovascular exam+ Valvular Problems/Murmurs AS  Rhythm:Regular Rate:Normal  NSTEMI 09/2023  Normal biventricular function Moderate to Severe AS by VTI valve area   Neuro/Psych neg Seizures negative neurological ROS  negative psych ROS   GI/Hepatic Neg liver ROS,GERD  ,,  Endo/Other  diabetes, Poorly Controlled    Renal/GU negative Renal ROS  negative genitourinary   Musculoskeletal negative musculoskeletal ROS (+)    Abdominal   Peds negative pediatric ROS (+)  Hematology negative hematology ROS (+) Hg 16.4   Anesthesia Other Findings   Reproductive/Obstetrics negative OB ROS                              Anesthesia Physical Anesthesia Plan  ASA: 4  Anesthesia Plan: MAC   Post-op Pain Management:    Induction: Intravenous  PONV Risk Score and Plan: 1 and Propofol  infusion and Treatment may vary due to age or medical condition  Airway Management Planned: Natural Airway  Additional Equipment: Arterial line  Intra-op Plan:   Post-operative Plan:   Informed Consent: I have reviewed the patients History and Physical, chart, labs and discussed the procedure including the risks, benefits and alternatives for the proposed anesthesia with the patient or authorized representative who has indicated his/her  understanding and acceptance.     Dental advisory given  Plan Discussed with: CRNA  Anesthesia Plan Comments: (83 y.o. male, with PMHx of HTN, T2DM, CAD s/p 2 DES to proximal to mid circumflex in 2018, CABG (LIMA-LAD, SVG- OM) in 2021, HFrEF, BPH, GERD, and OA , [facility] heart cath that showed multivessel stenosis requiring lithotripsy and DES stent to the mid-RCA. )         Anesthesia Quick Evaluation

## 2023-12-29 ENCOUNTER — Encounter (HOSPITAL_COMMUNITY): Payer: Self-pay | Admitting: Cardiovascular Disease

## 2023-12-29 ENCOUNTER — Inpatient Hospital Stay (HOSPITAL_COMMUNITY)

## 2023-12-29 DIAGNOSIS — I442 Atrioventricular block, complete: Secondary | ICD-10-CM | POA: Insufficient documentation

## 2023-12-29 DIAGNOSIS — I350 Nonrheumatic aortic (valve) stenosis: Principal | ICD-10-CM

## 2023-12-29 DIAGNOSIS — I452 Bifascicular block: Secondary | ICD-10-CM

## 2023-12-29 DIAGNOSIS — Z952 Presence of prosthetic heart valve: Secondary | ICD-10-CM | POA: Diagnosis not present

## 2023-12-29 DIAGNOSIS — I2510 Atherosclerotic heart disease of native coronary artery without angina pectoris: Secondary | ICD-10-CM

## 2023-12-29 DIAGNOSIS — I5030 Unspecified diastolic (congestive) heart failure: Secondary | ICD-10-CM

## 2023-12-29 LAB — ECHOCARDIOGRAM COMPLETE
AR max vel: 1.99 cm2
AV Area VTI: 2.14 cm2
AV Area mean vel: 2.06 cm2
AV Mean grad: 7 mmHg
AV Peak grad: 13.6 mmHg
Ao pk vel: 1.84 m/s
Area-P 1/2: 3.15 cm2
Calc EF: 60.6 %
Height: 69 in
MV VTI: 2.4 cm2
S' Lateral: 3.2 cm
Single Plane A2C EF: 57.8 %
Single Plane A4C EF: 62.5 %
Weight: 2892.44 [oz_av]

## 2023-12-29 LAB — BASIC METABOLIC PANEL WITH GFR
Anion gap: 8 (ref 5–15)
BUN: 18 mg/dL (ref 8–23)
CO2: 28 mmol/L (ref 22–32)
Calcium: 8.7 mg/dL — ABNORMAL LOW (ref 8.9–10.3)
Chloride: 102 mmol/L (ref 98–111)
Creatinine, Ser: 0.96 mg/dL (ref 0.61–1.24)
GFR, Estimated: >60 mL/min (ref >60)
Glucose, Bld: 156 mg/dL — ABNORMAL HIGH (ref 70–99)
Potassium: 3.6 mmol/L (ref 3.5–5.1)
Sodium: 138 mmol/L (ref 135–145)

## 2023-12-29 LAB — GLUCOSE, CAPILLARY
Glucose-Capillary: 124 mg/dL — ABNORMAL HIGH (ref 70–99)
Glucose-Capillary: 253 mg/dL — ABNORMAL HIGH (ref 70–99)
Glucose-Capillary: 98 mg/dL (ref 70–99)
Glucose-Capillary: 150 mg/dL — ABNORMAL HIGH (ref 70–99)

## 2023-12-29 LAB — CBC
HCT: 43.1 % (ref 39.0–52.0)
Hemoglobin: 14.5 g/dL (ref 13.0–17.0)
MCH: 31.4 pg (ref 26.0–34.0)
MCHC: 33.6 g/dL (ref 30.0–36.0)
MCV: 93.3 fL (ref 80.0–100.0)
Platelets: 134 K/uL — ABNORMAL LOW (ref 150–400)
RBC: 4.62 MIL/uL (ref 4.22–5.81)
RDW: 13.1 % (ref 11.5–15.5)
WBC: 18.3 K/uL — ABNORMAL HIGH (ref 4.0–10.5)
nRBC: 0 % (ref 0.0–0.2)

## 2023-12-29 LAB — MAGNESIUM: Magnesium: 1.9 mg/dL (ref 1.7–2.4)

## 2023-12-29 MED ORDER — FUROSEMIDE 40 MG PO TABS
40.0000 mg | ORAL_TABLET | Freq: Every day | ORAL | Status: DC
  Administered 2023-12-29 – 2023-12-30 (×2): 40 mg via ORAL
  Filled 2023-12-29 (×2): qty 1

## 2023-12-29 MED ORDER — POTASSIUM CHLORIDE CRYS ER 20 MEQ PO TBCR
20.0000 meq | EXTENDED_RELEASE_TABLET | Freq: Every day | ORAL | Status: DC
  Administered 2023-12-29 – 2023-12-31 (×3): 20 meq via ORAL
  Filled 2023-12-29 (×3): qty 1

## 2023-12-29 MED ORDER — POLYETHYLENE GLYCOL 3350 17 G PO PACK
17.0000 g | PACK | Freq: Every day | ORAL | Status: DC | PRN
  Administered 2023-12-29 – 2023-12-31 (×2): 17 g via ORAL
  Filled 2023-12-29 (×2): qty 1

## 2023-12-29 MED ORDER — SODIUM CHLORIDE 0.9% FLUSH
3.0000 mL | Freq: Two times a day (BID) | INTRAVENOUS | Status: DC
  Administered 2023-12-29 – 2023-12-31 (×4): 3 mL via INTRAVENOUS

## 2023-12-29 NOTE — Plan of Care (Signed)
  Problem: Education: Goal: Knowledge of General Education information will improve Description: Including pain rating scale, medication(s)/side effects and non-pharmacologic comfort measures Outcome: Progressing   Problem: Elimination: Goal: Will not experience complications related to urinary retention Outcome: Progressing   Problem: Pain Managment: Goal: General experience of comfort will improve and/or be controlled Outcome: Progressing   Problem: Safety: Goal: Ability to remain free from injury will improve Outcome: Progressing

## 2023-12-29 NOTE — Progress Notes (Signed)
 CARDIAC REHAB PHASE I   Post TAVR education including site care, restrictions, risk factors, heart healthy diabetic diet, exercise guidelines and CRP2 reviewed. All questions and concerns addressed. Pt is not interested in CRP2 at this time.   [id number] Vaughn Asberry H[employer], RN BSN 12/29/2023 11:38 AM

## 2023-12-29 NOTE — Consult Note (Signed)
 Cardiology Consultation   Patient ID: Sean Villarreal MRN: [id number]; DOB: [birth year]  Admit date: 12/28/2023 Date of Consult: 12/29/2023  PCP:  Vida Mardy DEL, PA-C   [facility] Providers Cardiologist:  Jayson Sierras, MD  Structural Heart:  Ozell Fell, MD   Patient Profile: Sean Villarreal is a 83 y.o. 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 Fell.  History of Present Illness: Sean Villarreal was admitted yesterday 04/28/23 for and underwent TAVR. 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 Post procedure appeared to have recovered AV conduction with AVW behavior and his temp wire left at back up of 40. Given his baseline conduction system disease w/RBBB, LAD, EP is asked to evaluate for recommendations possible need for PPM.  LABS K+ 3.6 BUN/Creat 18/0.96 WBC 18.3 H/H 14/43 Plts 134  Coreg  12.5mg  BID and lopressor  25mg  BID are both listed as home meds, though reports only taking coreg   He denies hx of syncope, near syncope No CP Feels well today, no SOB.  Past Medical History:  Diagnosis Date   Aortic stenosis    a. 03/2022 Echo: EF 60-65%, no rwma, mild LVH, nl RV fxn, mild-mod AS.   Arthritis    Basal cell carcinoma    Coronary artery disease    a. DES to LAD and LCx April 2014 b. PTCA ostial circumflex 03/2016 due to ISR c.  9/18 PCI/DESx2 osital Lcx (ISR), p/mLCx  d. s/p CABG on 01/24/2020 with LIMA-LAD and SVG-OM.   Essential hypertension    GERD (gastroesophageal reflux disease)    Hematuria    History of blood transfusion 09/2012   History of kidney stones    Hyperlipidemia LDL goal <70    NSTEMI (non-ST elevated myocardial infarction) (HCC) 08/2012   Post-op Afib  (HCC)    a. 12/2019-01/2020 following CABG. Briefly on amio.   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 Fell and Dr. Daniel   Type II diabetes mellitus [facility])     Past Surgical History:  Procedure Laterality Date   BASAL CELL CARCINOMA EXCISION     right cheek; both shoulders   CARDIAC CATHETERIZATION     CATARACT EXTRACTION W/ INTRAOCULAR LENS  IMPLANT, BILATERAL Bilateral    COLONOSCOPY N/A 03/19/2016   Procedure: COLONOSCOPY;  Surgeon: Claudis RAYMOND Rivet, MD;  Location: [facility];  Service: Endoscopy;  Laterality: N/A;  9:15   CORONARY ARTERY BYPASS GRAFT N/A 01/24/2020   Procedure: CORONARY ARTERY BYPASS GRAFTING (CABG), ON PUMP, TIMES TWO, USING LEFT INTERNAL MAMMARY ARTERY AND ENDOSCOPICALLY HARVESTED RIGHT GREATER SAPHENOUS VEIN;  Surgeon: Kerrin Elspeth BROCKS, MD;  Location: [facility] OR;  Service: Open Heart Surgery;  Laterality: N/A;   CORONARY BALLOON ANGIOPLASTY N/A 04/15/2016   Procedure: Coronary Balloon Angioplasty;  Surgeon: Peter M Jordan, MD;  Location: [facility] INVASIVE CV LAB;  Service: Cardiovascular;  Laterality: N/A;   CORONARY STENT INTERVENTION N/A 10/11/2016   Procedure: CORONARY STENT INTERVENTION;  Surgeon: Dann Candyce RAMAN, MD;  Location: [facility] INVASIVE CV LAB;  Service: Cardiovascular;  Laterality: N/A;   CYSTOSCOPY N/A 12/30/2022  Procedure: CYSTOSCOPY;  Surgeon: Sherrilee Belvie CROME, MD;  Location: [facility] ORS;  Service: Urology;  Laterality: N/A;   CYSTOSCOPY W/ URETEROSCOPY W/ LITHOTRIPSY  10/2012   /notes 11/10/2012   CYSTOSCOPY WITH STENT PLACEMENT  08/2012; 09/2012   EYE SURGERY Left ~ 02/2016   for crinkled lens   INGUINAL HERNIA REPAIR Left 12/24/2020   Procedure: HERNIA REPAIR INGUINAL WITH MESH;  Surgeon: Kallie Manuelita BROCKS, MD;  Location: [facility] ORS;  Service: General;  Laterality: Left;   INTRAOPERATIVE TRANSTHORACIC ECHOCARDIOGRAM N/A 12/28/2023   Procedure:  ECHOCARDIOGRAM, TRANSTHORACIC;  Surgeon: Wonda Sharper, MD;  Location: [facility] INVASIVE CV LAB;  Service: Cardiovascular;  Laterality: N/A;   LEFT HEART CATH AND CORONARY ANGIOGRAPHY N/A 04/15/2016   Procedure: Left Heart Cath and Coronary Angiography;  Surgeon: Peter M Jordan, MD;  Location: [facility] INVASIVE CV LAB;  Service: Cardiovascular;  Laterality: N/A;   LEFT HEART CATH AND CORONARY ANGIOGRAPHY N/A 10/11/2016   Procedure: LEFT HEART CATH AND CORONARY ANGIOGRAPHY;  Surgeon: Dann Candyce RAMAN, MD;  Location: [facility] INVASIVE CV LAB;  Service: Cardiovascular;  Laterality: N/A;   LEFT HEART CATH AND CORONARY ANGIOGRAPHY N/A 03/31/2018   Procedure: LEFT HEART CATH AND CORONARY ANGIOGRAPHY;  Surgeon: Burnard Debby LABOR, MD;  Location: [facility] INVASIVE CV LAB;  Service: Cardiovascular;  Laterality: N/A;   LEFT HEART CATH AND CORONARY ANGIOGRAPHY N/A 01/21/2020   Procedure: LEFT HEART CATH AND CORONARY ANGIOGRAPHY;  Surgeon: Claudene Victory ORN, MD;  Location: [facility] INVASIVE CV LAB;  Service: Cardiovascular;  Laterality: N/A;   LEFT HEART CATH AND CORONARY ANGIOGRAPHY N/A 11/21/2023   Procedure: LEFT HEART CATH AND CORONARY ANGIOGRAPHY;  Surgeon: Verlin Lonni BIRCH, MD;  Location: [facility] INVASIVE CV LAB;  Service: Cardiovascular;  Laterality: N/A;   LEFT HEART CATHETERIZATION WITH CORONARY ANGIOGRAM N/A 05/05/2012   Procedure: LEFT HEART CATHETERIZATION WITH CORONARY ANGIOGRAM;  Surgeon: Lonni BIRCH Verlin, MD;  Location: [facility] CATH LAB;  Service: Cardiovascular;  Laterality: N/A;   LEFT HEART CATHETERIZATION WITH CORONARY ANGIOGRAM N/A 05/15/2012   Procedure: LEFT HEART CATHETERIZATION WITH CORONARY ANGIOGRAM;  Surgeon: Lonni BIRCH Verlin, MD;  Location: [facility] CATH LAB;  Service: Cardiovascular;  Laterality: N/A;   TEE WITHOUT CARDIOVERSION N/A 01/24/2020   Procedure: TRANSESOPHAGEAL ECHOCARDIOGRAM (TEE);  Surgeon: Kerrin Elspeth BROCKS, MD;  Location: [facility] OR;  Service: Open Heart Surgery;  Laterality: N/A;   TONSILLECTOMY   1947   TRANSURETHRAL RESECTION OF PROSTATE N/A 12/30/2022   Procedure: TRANSURETHRAL RESECTION OF THE PROSTATE (TURP);  Surgeon: Sherrilee Belvie CROME, MD;  Location: [facility] ORS;  Service: Urology;  Laterality: N/A;     Home Medications:  Prior to Admission medications   Medication Sig Start Date End Date Taking? Authorizing Provider  acetaminophen  (TYLENOL ) 500 MG tablet Take 1,000 mg by mouth every 6 (six) hours as needed for fever or headache (pain).   Yes [provider]  aspirin  EC 81 MG tablet Take 81 mg by mouth daily.   Yes [provider]  atorvastatin  (LIPITOR ) 80 MG tablet Take 1 tablet (80 mg total) by mouth daily. 12/08/23  Yes Strader, Brittany M, PA-C  carvedilol  (COREG ) 12.5 MG tablet Take 1 tablet (12.5 mg total) by mouth 2 (two) times daily. 10/31/23  Yes Debera Jayson MATSU, MD  clopidogrel  (PLAVIX ) 75 MG tablet Take 1 tablet (75 mg total) by mouth daily. 10/31/23  Yes Debera Jayson MATSU, MD  dapagliflozin  propanediol (FARXIGA ) 10 MG TABS tablet Take 1 tablet (10 mg total) by mouth daily before breakfast. 12/08/23  Yes Strader,  Brittany M, PA-C  Dulaglutide  (TRULICITY ) 0.75 MG/0.5ML SOAJ Inject 0.75 mg as directed once a week. Patient taking differently: Inject 0.75 mg as directed every Wednesday. 07/08/23  Yes   ezetimibe  (ZETIA ) 10 MG tablet Take 1 tablet (10 mg total) by mouth at bedtime. 12/08/23 03/19/24 Yes Strader, Laymon HERO, PA-C  furosemide  (LASIX ) 40 MG tablet Take 1 tablet (40 mg total) by mouth daily. 10/12/23  Yes Debera Jayson MATSU, MD  glimepiride  (AMARYL ) 1 MG tablet Take 1 tablet (1 mg total) by mouth daily. 01/21/23  Yes   latanoprost  (XALATAN ) 0.005 % ophthalmic solution Place 1 drop into the left eye at bedtime. 10/06/21  Yes [provider]  metFORMIN  (GLUCOPHAGE ) 1000 MG tablet Take 1 tablet (1,000 mg total) by mouth 2 (two) times daily. 11/01/23  Yes   metoprolol  tartrate (LOPRESSOR ) 25 MG tablet Take 25 mg by mouth 2 (two) times daily.    Yes [provider]  potassium chloride  SA (KLOR-CON  M) 20 MEQ tablet Take 1 tablet (20 mEq total) by mouth daily. 06/15/23  Yes Miriam Norris, NP  SIMBRINZA 1-0.2 % SUSP Place 1 drop into the left eye 2 (two) times daily. 05/31/22  Yes [provider]  spironolactone  (ALDACTONE ) 25 MG tablet Take 0.5 tablets (12.5 mg total) by mouth daily. 12/08/23 03/19/24 Yes Strader, Laymon HERO, PA-C  tamsulosin  (FLOMAX ) 0.4 MG CAPS capsule Take 1 capsule (0.4 mg total) by mouth 2 (two) times daily. 01/21/23  Yes   fluticasone  (FLONASE ) 50 MCG/ACT nasal spray Place 1 spray into both nostrils daily as needed for allergies or rhinitis.    [provider]  nitroGLYCERIN  (NITROSTAT ) 0.4 MG SL tablet Place 0.4 mg under the tongue every 5 (five) minutes x 3 doses as needed for chest pain.    [provider]    Scheduled Meds:  aspirin  EC  81 mg Oral Daily   atorvastatin   80 mg Oral Daily   brinzolamide   1 drop Both Eyes TID   And   brimonidine   1 drop Both Eyes TID   Chlorhexidine  Gluconate Cloth  6 each Topical Daily   clopidogrel   75 mg Oral Daily   ezetimibe   10 mg Oral QHS   furosemide   40 mg Oral Daily   insulin  aspart  0-24 Units Subcutaneous TID AC & HS   potassium chloride  SA  20 mEq Oral Daily   sodium chloride  flush  3 mL Intravenous Q12H   tamsulosin   0.4 mg Oral BID   Continuous Infusions:  sodium chloride      nitroGLYCERIN      norepinephrine  (LEVOPHED ) Adult infusion     PRN Meds: sodium chloride , acetaminophen  **OR** acetaminophen , fluticasone , morphine  injection, ondansetron  (ZOFRAN ) IV, mouth rinse, oxyCODONE , sodium chloride  flush, traMADol   Allergies:    Allergies  Allergen Reactions   Contrast Media [Iodinated Contrast Media] Hives   Jardiance  [Empagliflozin ] Diarrhea   Penicillins Other (See Comments)    Syncope  Childhood reaction    Social History:   Social History   Socioeconomic History   Marital status: Widowed    Spouse name: Not  on file   Number of children: Not on file   Years of education: Not on file   Highest education level: Not on file  Occupational History   Not on file  Tobacco Use   Smoking status: Former    Current packs/day: 0.00    Average packs/day: 0.8 packs/day for 30.0 years (22.5 ttl pk-yrs)    Types: Cigarettes    Start  date: 01/25/1982    Quit date: 11/05/1988    Years since quitting: 35.1   Smokeless tobacco: Never  Vaping Use   Vaping status: Never Used  Substance and Sexual Activity   Alcohol  use: Not Currently    Alcohol /week: 0.0 standard drinks of alcohol    Drug use: No   Sexual activity: Not Currently  Other Topics Concern   Not on file  Social History Narrative   Not on file   Social Drivers of Health   Financial Resource Strain: Not on file  Food Insecurity: No Food Insecurity (12/28/2023)   Hunger Vital Sign    Worried About Running Out of Food in the Last Year: Never true    Ran Out of Food in the Last Year: Never true  Transportation Needs: No Transportation Needs (12/28/2023)   PRAPARE - Administrator, Civil Service (Medical): No    Lack of Transportation (Non-Medical): No  Physical Activity: Not on file  Stress: Not on file  Social Connections: Socially Isolated (12/28/2023)   Social Connection and Isolation Panel    Frequency of Communication with Friends and Family: More than three times a week    Frequency of Social Gatherings with Friends and Family: More than three times a week    Attends Religious Services: Never    Database Administrator or Organizations: No    Attends Banker Meetings: Never    Marital Status: Widowed  Intimate Partner Violence: Not At Risk (12/28/2023)   Humiliation, Afraid, Rape, and Kick questionnaire    Fear of Current or Ex-Partner: No    Emotionally Abused: No    Physically Abused: No    Sexually Abused: No    Family History:   Family History  Problem Relation Age of Onset   Heart disease Mother     Heart failure Mother    Heart disease Father    Heart attack Father    Colon cancer Neg Hx      ROS:  Please see the history of present illness.  All other ROS reviewed and negative.     Physical Exam/Data: Vitals:   12/29/23 1115 12/29/23 1130 12/29/23 1145 12/29/23 1200  BP: 113/64 107/65 (!) 85/58 (!) 109/51  Pulse: 65 73 78 75  Resp: 13 (!) 26 (!) 23 20  Temp:      TempSrc:      SpO2: 98% 98% 98% 99%  Weight:      Height:        Intake/Output Summary (Last 24 hours) at 12/29/2023 1236 Last data filed at 12/29/2023 0630 Gross per 24 hour  Intake 1136.28 ml  Output 1210 ml  Net -73.72 ml      12/29/2023    6:00 AM 12/28/2023    8:14 AM 12/15/2023   10:18 AM  Last 3 Weights  Weight (lbs) 180 lb 12.4 oz 180 lb 178 lb  Weight (kg) 82 kg 81.647 kg 80.74 kg     Body mass index is 26.7 kg/m.  General:  Well nourished, well developed, in no acute distress HEENT: normal Neck: no JVD, RIJ site is stable Vascular: No carotid bruits; Distal pulses 2+ bilaterally Cardiac:  RRR; no murmurs, gallops or rubs Lungs: CTA b/l, no wheezing, rhonchi or rales  Abd: soft, nontender Ext: no edema Musculoskeletal:  No deformities Neuro:   no focal abnormalities noted Psych:  Normal affect   EKG:  The EKG was personally reviewed and demonstrates:    SR 74bpm, RBBB,  LAD  OLD 12/26/23: SR 87bpm, RBBB, LAD  Telemetry:  Telemetry was personally reviewed and demonstrates:    SR, AV wenckebach initially post procedure with rates 50, overnight similar with some variable PR intervals, and both AVW and perhaps mobitz II, some possible blocks PACs, daytime > afternoon more constantly 1:1 conduction 60's-70's  Relevant CV Studies:  12/29/23: TTE  1. Left ventricular ejection fraction, by estimation, is 60 to 65%. Left  ventricular ejection fraction by 2D MOD biplane is 60.6 %. The left  ventricle has normal function. The left ventricle has no regional wall  motion abnormalities. Left  ventricular  diastolic function could not be evaluated.   2. Right ventricular systolic function is normal. The right ventricular  size is moderately enlarged.   3. The mitral valve is normal in structure. Mild mitral valve  regurgitation. No evidence of mitral stenosis. Moderate mitral annular  calcification.   4. Prosthetic valve well seated with normal leaflet motion and normal  function (PV 1.8 m/sec, MG 7 mmHg, DVI 0.5) and no evidence of  transvalvular or paravalvular regurgitation. The aortic valve has been  repaired/replaced. Aortic valve regurgitation is  not visualized. No aortic stenosis is present. There is a 26 mm Edwards  S3U valve present in the aortic position. Procedure Date: 12/28/23.   5. The inferior vena cava is dilated in size with <50% respiratory  variability, suggesting right atrial pressure of 15 mmHg.   Comparison(s): Prior images reviewed side by side. S/p TAVR. EF has  improved but RV now seems enlarged and IVC dilated.    Cardiac Catheterization: 10/2023   Ost Cx to Prox Cx lesion is 100% stenosed.   Ost LAD to Prox LAD lesion is 99% stenosed.   Mid LM to Ost LAD lesion is 99% stenosed.   Mid LAD lesion is 100% stenosed.   Previously placed Prox RCA to Mid RCA stent of unknown type is  widely patent.   LIMA graft was visualized by angiography and is normal in caliber.   SVG graft was visualized by angiography and is normal in caliber.   Chronic occlusion proximal LAD. Patent LIMA to mid LAD filling the mid and distal LAD Chronic occlusion ostial Circumflex. Patent vein graft to obtuse marginal branch.  Large dominant RCA with patent mid stented segment with no restenosis.  Normal LVEDP Severe low flow/low gradient aortic stenosis by echo.    Recommendations: I suspect that his symptoms are related to his aortic stenosis. Will monitor overnight. Will plan outpatient pre-TAVR CT scans and visit with the CT surgeon and continue down pathway for TAVR.    Laboratory Data: High Sensitivity Troponin:  No results for input(s): TROPONINIHS in the last 720 hours.   Chemistry Recent Labs  Lab 12/26/23 0914 12/29/23 0215  NA 145 138  K 4.2 3.6  CL 102 102  CO2 31 28  GLUCOSE 166* 156*  BUN 18 18  CREATININE 1.08 0.96  CALCIUM  9.6 8.7*  MG  --  1.9  GFRNONAA >60 >60  ANIONGAP 12 8    Recent Labs  Lab 12/26/23 0914  PROT 7.2  ALBUMIN  3.9  AST 18  ALT 19  ALKPHOS 42  BILITOT 1.0   Lipids No results for input(s): CHOL, TRIG, HDL, LABVLDL, LDLCALC, CHOLHDL in the last 168 hours.  Hematology Recent Labs  Lab 12/26/23 0914 12/29/23 0215  WBC 8.7 18.3*  RBC 5.30 4.62  HGB 16.4 14.5  HCT 50.7 43.1  MCV 95.7 93.3  MCH 30.9  31.4  MCHC 32.3 33.6  RDW 13.2 13.1  PLT 164 134*   Thyroid  No results for input(s): TSH, FREET4 in the last 168 hours.  BNPNo results for input(s): BNP, PROBNP in the last 168 hours.  DDimer No results for input(s): DDIMER in the last 168 hours.  Radiology/Studies:  DG Chest 2 View Result Date: 12/26/2023 CLINICAL DATA:  Severe aortic stenosis.  Preop. EXAM: CHEST - 2 VIEW COMPARISON:  11/18/2023 FINDINGS: Prior median sternotomy.The cardiomediastinal contours are normal. Coronary stent visualized. Pulmonary vasculature is normal. No consolidation, pleural effusion, or pneumothorax. No acute osseous abnormalities are seen. IMPRESSION: No acute chest findings. Prior median sternotomy. Electronically Signed   By: Andrea Gasman M.D.   On: 12/26/2023 22:32     Assessment and Plan:  Conduction system disease RBBB, LAD Goes back to 2021  S/p TAVR 12/28/23 Intra procedure with wore across the valve developed CHB and was paced through his procedure.  He has regained AV conduction though with evidence of AVW, some brief periods that are more c/w Mobitz II. Variable PR interval No bradycardia No back up pacing requirements He is on coreg  at home  Given his baseline conduction  system disease and CHB with initial wire pass, despite regained conduction, post TAVR concerns of recurrent high AV block and recommend PPM I discussed with the patient his clinical situation, role of PPM, and rational for recommendation Dr. Nancey as well has been bedside, discussed risks, benefits of PPM risks, benefits of not pursuing PPM The patient very much prefers to pursue PPM implant rather then not Given his Plavix , will plan for tomorrow I d/w Dr. Wonda, would be ok to hold plavix  a few days peri-procedure   For questions or updates, please contact [facility] Please consult [email or web address] for contact info under    Signed, Charlies Macario Arthur, PA-C  12/29/2023 12:36 PM

## 2023-12-29 NOTE — Progress Notes (Addendum)
 HEART AND VASCULAR CENTER   MULTIDISCIPLINARY HEART VALVE TEAM  Patient Name: Sean Villarreal Date of Encounter: 12/29/2023  Admit date: 12/28/2023   PCP:  Vida Mardy DEL, PA-C  [facility] Cardiologist:  Jayson Sierras, MD  [facility] Structural heart: Ozell Fell, MD [facility] Electrophysiologist:  None   [facility] Problem List     Principal Problem:   S/P TAVR (transcatheter aortic valve replacement) Active Problems:   Coronary artery disease   Type II diabetes mellitus (HCC)   Hypertension   Dyslipidemia   GERD (gastroesophageal reflux disease)   Severe aortic stenosis     Subjective   No complaints. Sitting up in chair. Understand high possibility of a pacemaker.   Inpatient Medications    Scheduled Meds:  aspirin  EC  81 mg Oral Daily   atorvastatin   80 mg Oral Daily   brinzolamide   1 drop Both Eyes TID   And   brimonidine   1 drop Both Eyes TID   Chlorhexidine  Gluconate Cloth  6 each Topical Daily   clopidogrel   75 mg Oral Daily   ezetimibe   10 mg Oral QHS   furosemide   40 mg Oral Daily   insulin  aspart  0-24 Units Subcutaneous TID AC & HS   potassium chloride  SA  20 mEq Oral Daily   sodium chloride  flush  3 mL Intravenous Q12H   tamsulosin   0.4 mg Oral BID   Continuous Infusions:  sodium chloride      nitroGLYCERIN      norepinephrine  (LEVOPHED ) Adult infusion     PRN Meds: sodium chloride , acetaminophen  **OR** acetaminophen , fluticasone , morphine  injection, ondansetron  (ZOFRAN ) IV, mouth rinse, oxyCODONE , sodium chloride  flush, traMADol    Vital Signs    Vitals:   12/29/23 0815 12/29/23 0817 12/29/23 0845 12/29/23 0847  BP:    (!) 106/52  Pulse: 81 80 68 69  Resp: (!) 24 (!) 27 16 13   Temp:  98.2 F (36.8 C)    TempSrc:  Oral    SpO2: 97% 99% 96% 96%  Weight:      Height:        Intake/Output Summary (Last 24 hours) at 12/29/2023 1100 Last data filed at 12/29/2023 0630 Gross per 24 hour  Intake 1136.28 ml  Output 1210 ml   Net -73.72 ml   Filed Weights   12/28/23 0814 12/29/23 0600  Weight: 81.6 kg 82 kg    Physical Exam    GEN: Well nourished, well developed, in no acute distress.  HEENT: Grossly normal.  Neck: Supple, no JVD or masses. Cardiac: RRR, no murmurs, rubs, or gallops. No clubbing, cyanosis, edema.   Respiratory:  Respirations regular and unlabored, clear to auscultation bilaterally. GI: Soft, nontender, nondistended, BS + x 4. MS: no deformity or atrophy. Skin: warm and dry, no rash.  Groin sites clear without hematoma or ecchymosis  Neuro:  Strength and sensation are intact. Psych: AAOx3.  Normal affect.  Labs    CBC Recent Labs    12/29/23 0215  WBC 18.3*  HGB 14.5  HCT 43.1  MCV 93.3  PLT 134*   Basic Metabolic Panel: Recent Labs  Lab 12/26/23 0914 12/29/23 0215  NA 145 138  K 4.2 3.6  CL 102 102  CO2 31 28  GLUCOSE 166* 156*  BUN 18 18  CREATININE 1.08 0.96  CALCIUM  9.6 8.7*  MG  --  1.9   GFR: Estimated Creatinine Clearance: 59.3 mL/min (by C-G formula based on SCr of 0.96 mg/dL). Recent Labs  Lab 12/26/23  0914 12/29/23 0215  WBC 8.7 18.3*    Telemetry    Sinus with periods of 2:1 block and dropped beats - Personally Reviewed  ECG    Sinus with bifascicular block, PACs HR 74 - Personally Reviewed  Patient Profile     Sean Villarreal is a 83 y.o. male with a history of ischemic cardiomyopathy with HFrEF, extensive CAD with 2v CABG in 2021, HTN, DMT2, BPH, GERD, OA, bifascicular block (RBBB/LAFB), symptomatic inguinal hernia and mod-sev aortic stenosis who presented to [facility] on 12/28/23 for planned TAVR.   Assessment & Plan    Severe AS:  -- S/p TAVR with a 26 mm Edwards Sapien 3 Ultra Resilia  THV via the TF approach on 12/28/23.  -- Post operative echo showed EF 60%, mod RVE, mod MAC with mild MR, normally functioning TAVR with a mean gradient of 7 mmHg and no PVL -- Groin site stable.  -- Continue Asprin 81mg  daily and Plavix  75mg  daily given  recent PCI.   Bifascicular block (RBBB/LAFB): -- Had CHB perioperatively. RIJ pacer left in place.  -- Tele with 2:1 AV block and some dropped beats. No pacing requirements, set to 40 as back up.  -- EP to see today to discuss PPM.    CAD s/p CABGx2: -- NSTEMI in 09/2023 with DES to RCA. -- Cath 11/21/23 w/ chronic occlusion proximal LAD. Patent LIMA to mid LAD filling the mid and distal LAD. Chronic occlusion ostial Circumflex. Patent vein graft to obtuse marginal branch. Large dominant RCA with patent mid stented segment with no restenosis. -- Continue Asprin 81mg  daily and Plavix  75mg  daily given recent PCI.  -- Continue atorvastatin  80mg  daily.    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  -- Will restart home Lasix  40mg  daily/KCL 20 meq with dilated IVC and enlarged RV on echo.    DMT2:  -- Continue SSI  Signed, Lamarr Hummer, PA-C  12/29/2023, 11:00 AM  Pager [phone]  Patient seen, examined. Available data reviewed. Agree with findings, assessment, and plan as outlined by Izetta Hummer, PA-C. On exam, patient is alert, oriented, in NAD. Lungs CTA, temp pacer in right internal jugular with clear access site, heart irrgular with soft 1/6 systolic murmur at the RUSB, left groin site and left radial site clear with no hematoma or ecchymosis, no leg edema. Tele shows periods of 2:1 AV block. Appreciate EP consult and plans to place PPM tomorrow. OK to hold plavix  for a few days as requested. POD #1 echo reviewed and shows normal LV function and normal TAVR valve function with mean gradient of 7 mmHg and no PVL. Otherwise, as above.   Ozell Fell, M.D. 12/29/2023 5:36 PM

## 2023-12-29 NOTE — Progress Notes (Signed)
  Echocardiogram 2D Echocardiogram has been performed.  Norleen ORN [facility] 12/29/2023, 8:19 AM

## 2023-12-29 NOTE — Care Management (Signed)
 Transition of Care [facility]) - Inpatient Brief Assessment   Patient Details  Name: Sean Villarreal MRN: [id number] Date of Birth: Aug 22, 1940  Transition of Care [facility]) CM/SW Contact:    Corean JAYSON Canary, RN Phone Number: 12/29/2023, 4:21 PM   Clinical Narrative: 83 yo widowed gentleman, history of CABG in 2021 presented with severe aortic stenosis, TAVR procedure. Previous right groin pain of note due to possible pseudoaneurysm. This is being worked up, and he had ordered a hernia belt to assist with this. Prior to this groin issue he had a cath. He was able to mow yard and do activities prior to this issue.  He has been evaluated [facility] and was recommended level 2 but he has declined at this time.  He has family that live in [place] , and he has a temporary address listed there.     SDOH  reveals social isolation  He will need interventions and resources prior to DC.   IPCM will follow     Transition of Care Asessment: Insurance and Status: Insurance coverage has been reviewed Patient has primary care physician: Yes Home environment has been reviewed: [facility] Prior level of function:: independent Prior/Current Home Services: No current home services Social Drivers of Health Review: SDOH reviewed needs interventions Readmission risk has been reviewed: Yes Transition of care needs: transition of care needs identified, TOC will continue to follow

## 2023-12-30 ENCOUNTER — Inpatient Hospital Stay (HOSPITAL_COMMUNITY): Admission: RE | Disposition: A | Payer: Self-pay | Source: Home / Self Care | Attending: Cardiovascular Disease

## 2023-12-30 DIAGNOSIS — Z952 Presence of prosthetic heart valve: Secondary | ICD-10-CM | POA: Diagnosis not present

## 2023-12-30 DIAGNOSIS — I442 Atrioventricular block, complete: Secondary | ICD-10-CM

## 2023-12-30 DIAGNOSIS — I452 Bifascicular block: Secondary | ICD-10-CM | POA: Diagnosis not present

## 2023-12-30 DIAGNOSIS — I350 Nonrheumatic aortic (valve) stenosis: Secondary | ICD-10-CM | POA: Diagnosis not present

## 2023-12-30 DIAGNOSIS — I2510 Atherosclerotic heart disease of native coronary artery without angina pectoris: Secondary | ICD-10-CM | POA: Diagnosis not present

## 2023-12-30 HISTORY — PX: PACEMAKER IMPLANT: EP1218

## 2023-12-30 LAB — BASIC METABOLIC PANEL WITH GFR
Anion gap: 7 (ref 5–15)
BUN: 16 mg/dL (ref 8–23)
CO2: 30 mmol/L (ref 22–32)
Calcium: 8.4 mg/dL — ABNORMAL LOW (ref 8.9–10.3)
Chloride: 102 mmol/L (ref 98–111)
Creatinine, Ser: 0.88 mg/dL (ref 0.61–1.24)
GFR, Estimated: >60 mL/min (ref >60)
Glucose, Bld: 132 mg/dL — ABNORMAL HIGH (ref 70–99)
Potassium: 3.7 mmol/L (ref 3.5–5.1)
Sodium: 139 mmol/L (ref 135–145)

## 2023-12-30 LAB — GLUCOSE, CAPILLARY
Glucose-Capillary: 200 mg/dL — ABNORMAL HIGH (ref 70–99)
Glucose-Capillary: 266 mg/dL — ABNORMAL HIGH (ref 70–99)
Glucose-Capillary: 220 mg/dL — ABNORMAL HIGH (ref 70–99)
Glucose-Capillary: 248 mg/dL — ABNORMAL HIGH (ref 70–99)

## 2023-12-30 LAB — SURGICAL PCR SCREEN
MRSA, PCR: NEGATIVE
Staphylococcus aureus: NEGATIVE

## 2023-12-30 LAB — CBC
HCT: 44.5 % (ref 39.0–52.0)
Hemoglobin: 14.8 g/dL (ref 13.0–17.0)
MCH: 31 pg (ref 26.0–34.0)
MCHC: 33.3 g/dL (ref 30.0–36.0)
MCV: 93.3 fL (ref 80.0–100.0)
Platelets: 117 K/uL — ABNORMAL LOW (ref 150–400)
RBC: 4.77 MIL/uL (ref 4.22–5.81)
RDW: 13.5 % (ref 11.5–15.5)
WBC: 11.6 K/uL — ABNORMAL HIGH (ref 4.0–10.5)
nRBC: 0 % (ref 0.0–0.2)

## 2023-12-30 SURGERY — PACEMAKER IMPLANT

## 2023-12-30 MED ORDER — ACETAMINOPHEN 325 MG PO TABS: 325.0000 mg | ORAL_TABLET | ORAL | Status: DC | PRN

## 2023-12-30 MED ORDER — MAGNESIUM SULFATE 2 GM/50ML IV SOLN
2.0000 g | Freq: Once | INTRAVENOUS | Status: AC
  Administered 2023-12-30: 2 g via INTRAVENOUS
  Filled 2023-12-30: qty 50

## 2023-12-30 MED ORDER — FENTANYL CITRATE (PF) 100 MCG/2ML IJ SOLN
INTRAMUSCULAR | Status: AC
  Filled 2023-12-30: qty 2

## 2023-12-30 MED ORDER — CEFAZOLIN SODIUM-DEXTROSE 2-4 GM/100ML-% IV SOLN
2.0000 g | INTRAVENOUS | Status: AC
  Administered 2023-12-30: 2 g via INTRAVENOUS
  Filled 2023-12-30: qty 100

## 2023-12-30 MED ORDER — LIDOCAINE HCL (PF) 1 % IJ SOLN
INTRAMUSCULAR | Status: DC | PRN
  Administered 2023-12-30: 60 mL

## 2023-12-30 MED ORDER — SODIUM CHLORIDE 0.9% FLUSH: 3.0000 mL | INTRAVENOUS | Status: DC | PRN

## 2023-12-30 MED ORDER — CEFAZOLIN SODIUM-DEXTROSE 1-4 GM/50ML-% IV SOLN
1.0000 g | Freq: Four times a day (QID) | INTRAVENOUS | Status: AC
  Administered 2023-12-30 – 2023-12-31 (×3): 1 g via INTRAVENOUS
  Filled 2023-12-30 (×3): qty 50

## 2023-12-30 MED ORDER — DIPHENHYDRAMINE HCL 25 MG PO CAPS: 50.0000 mg | ORAL_CAPSULE | Freq: Once | ORAL | Status: AC

## 2023-12-30 MED ORDER — MIDAZOLAM HCL 2 MG/2ML IJ SOLN
INTRAMUSCULAR | Status: AC
  Filled 2023-12-30: qty 2

## 2023-12-30 MED ORDER — SODIUM CHLORIDE 0.9 % IV SOLN
INTRAVENOUS | Status: AC
  Filled 2023-12-30: qty 2

## 2023-12-30 MED ORDER — CEFAZOLIN SODIUM-DEXTROSE 2-4 GM/100ML-% IV SOLN
INTRAVENOUS | Status: DC
  Filled 2023-12-30: qty 100

## 2023-12-30 MED ORDER — SODIUM CHLORIDE 0.9 % IV SOLN
80.0000 mg | INTRAVENOUS | Status: AC
  Administered 2023-12-30: 80 mg

## 2023-12-30 MED ORDER — DIPHENHYDRAMINE HCL 50 MG/ML IJ SOLN
50.0000 mg | Freq: Once | INTRAMUSCULAR | Status: AC
  Administered 2023-12-30: 50 mg via INTRAVENOUS
  Filled 2023-12-30: qty 1

## 2023-12-30 MED ORDER — CHLORHEXIDINE GLUCONATE 4 % EX SOLN
60.0000 mL | Freq: Once | CUTANEOUS | Status: AC
  Administered 2023-12-30: 4 via TOPICAL
  Filled 2023-12-30: qty 15

## 2023-12-30 MED ORDER — LIDOCAINE HCL (PF) 1 % IJ SOLN
INTRAMUSCULAR | Status: AC
  Filled 2023-12-30: qty 60

## 2023-12-30 MED ORDER — PREDNISONE 20 MG PO TABS
50.0000 mg | ORAL_TABLET | Freq: Four times a day (QID) | ORAL | Status: AC
  Administered 2023-12-30 (×3): 50 mg via ORAL
  Filled 2023-12-30 (×3): qty 3

## 2023-12-30 MED ORDER — FENTANYL CITRATE (PF) 100 MCG/2ML IJ SOLN
INTRAMUSCULAR | Status: DC | PRN
  Administered 2023-12-30 (×2): 12.5 ug via INTRAVENOUS

## 2023-12-30 MED ORDER — SODIUM CHLORIDE 0.9 % IV SOLN: INTRAVENOUS | Status: DC

## 2023-12-30 MED ORDER — MIDAZOLAM HCL 5 MG/5ML IJ SOLN
INTRAMUSCULAR | Status: DC | PRN
  Administered 2023-12-30 (×2): 1 mg via INTRAVENOUS

## 2023-12-30 MED ORDER — CHLORHEXIDINE GLUCONATE 4 % EX SOLN
60.0000 mL | Freq: Once | CUTANEOUS | Status: AC
  Administered 2023-12-30: 4 via TOPICAL
  Filled 2023-12-30: qty 60

## 2023-12-30 SURGICAL SUPPLY — 11 items
CABLE SURGICAL S-101-97-12 (CABLE) ×1 IMPLANT
KIT ACCESSORY SELECTRA FIX CVD (MISCELLANEOUS) IMPLANT
LEAD SELECTRA 3D-65-42 (CATHETERS) IMPLANT
LEAD SOLIA S PRO MRI 53 (Lead) IMPLANT
LEAD SOLIA S PRO MRI 60 (Lead) IMPLANT
PACEMAKER AMVIA DR-T 460163 (Pacemaker) IMPLANT
PAD DEFIB RADIO PHYSIO CONN (PAD) ×1 IMPLANT
SHEATH 7FR PRELUDE SNAP 13 (SHEATH) IMPLANT
SHEATH 9FR PRELUDE SNAP 13 (SHEATH) IMPLANT
TRAY PACEMAKER INSERTION (PACKS) ×1 IMPLANT
WIRE HI TORQ VERSACORE-J 145CM (WIRE) IMPLANT

## 2023-12-30 NOTE — Progress Notes (Addendum)
 [facility]   MULTIDISCIPLINARY HEART VALVE TEAM  Patient Name: Sean Villarreal Date of Encounter: 12/30/2023  Admit date: 12/28/2023   PCP:  Vida Mardy DEL, PA-C  [facility] Cardiologist:  Jayson Sierras, MD  [facility] Structural heart: Ozell Fell, MD [facility] Electrophysiologist:  None   Hospital Problem List     Principal Problem:   S/P TAVR (transcatheter aortic valve replacement) Active Problems:   Coronary artery disease   Type II diabetes mellitus (HCC)   Hypertension   Dyslipidemia   GERD (gastroesophageal reflux disease)   Severe aortic stenosis   CHB (complete heart block) (HCC)     Subjective   Awaiting pacemaker. No complaints.   Inpatient Medications    Scheduled Meds:  aspirin  EC  81 mg Oral Daily   atorvastatin   80 mg Oral Daily   brinzolamide   1 drop Both Eyes TID   And   brimonidine   1 drop Both Eyes TID   chlorhexidine   60 mL Topical Once   Chlorhexidine  Gluconate Cloth  6 each Topical Daily   diphenhydrAMINE   50 mg Oral Once   Or   diphenhydrAMINE   50 mg Intravenous Once   ezetimibe   10 mg Oral QHS   furosemide   40 mg Oral Daily   gentamicin  (GARAMYCIN ) 80 mg in sodium chloride  0.9 % 500 mL irrigation  80 mg Irrigation On Call   insulin  aspart  0-24 Units Subcutaneous TID AC & HS   potassium chloride  SA  20 mEq Oral Daily   predniSONE   50 mg Oral Q6H   sodium chloride  flush  3 mL Intravenous Q12H   sodium chloride  flush  3 mL Intravenous Q12H   tamsulosin   0.4 mg Oral BID   Continuous Infusions:  sodium chloride      sodium chloride  50 mL/hr at 12/30/23 0548    ceFAZolin  (ANCEF ) IV     nitroGLYCERIN      norepinephrine  (LEVOPHED ) Adult infusion     PRN Meds: sodium chloride , acetaminophen  **OR** acetaminophen , fluticasone , morphine  injection, ondansetron  (ZOFRAN ) IV, mouth rinse, oxyCODONE , polyethylene glycol, sodium chloride  flush, sodium chloride  flush, traMADol    Vital Signs    Vitals:    12/30/23 0600 12/30/23 0615 12/30/23 0630 12/30/23 0700  BP: 131/77 139/76 139/81 128/81  Pulse: 81 81 79 83  Resp: 15 14 15 13   Temp:      TempSrc:      SpO2: 95% 95% 95% 96%  Weight:      Height:        Intake/Output Summary (Last 24 hours) at 12/30/2023 9177 Last data filed at 12/30/2023 0600 Gross per 24 hour  Intake --  Output 1600 ml  Net -1600 ml   Filed Weights   12/28/23 0814 12/29/23 0600 12/30/23 0500  Weight: 81.6 kg 82 kg 82.6 kg    Physical Exam    GEN: Well nourished, well developed, in no acute distress.  HEENT: Grossly normal.  Neck: Supple, no JVD or masses. Pacer in right neck. Cardiac: RRR, no murmurs, rubs, or gallops. No clubbing, cyanosis, edema.   Respiratory:  Respirations regular and unlabored, clear to auscultation bilaterally. GI: Soft, nontender, nondistended, BS + x 4. MS: no deformity or atrophy. Skin: warm and dry, no rash.  Groin site clear without hematoma or ecchymosis  Neuro:  Strength and sensation are intact. Psych: AAOx3.  Normal affect.  Labs    CBC Recent Labs    12/29/23 0215 12/30/23 0254  WBC 18.3* 11.6*  HGB 14.5  14.8  HCT 43.1 44.5  MCV 93.3 93.3  PLT 134* 117*   Basic Metabolic Panel: Recent Labs  Lab 12/26/23 0914 12/29/23 0215 12/30/23 0254  NA 145 138 139  K 4.2 3.6 3.7  CL 102 102 102  CO2 31 28 30   GLUCOSE 166* 156* 132*  BUN 18 18 16   CREATININE 1.08 0.96 0.88  CALCIUM  9.6 8.7* 8.4*  MG  --  1.9  --    GFR: Estimated Creatinine Clearance: 64.7 mL/min (by C-G formula based on SCr of 0.88 mg/dL). Recent Labs  Lab 12/26/23 0914 12/29/23 0215 12/30/23 0254  WBC 8.7 18.3* 11.6*    Telemetry    Sinus with HRs in 80s, PACs,  - Personally Reviewed  ECG    Sinus with bifascicular block, PACs HR 74 - Personally Reviewed  Patient Profile     Sean Villarreal is a 83 y.o. male with a history of ischemic cardiomyopathy with HFrEF, extensive CAD with 2v CABG in 2021, HTN, DMT2, BPH, GERD, OA,  bifascicular block (RBBB/LAFB), symptomatic inguinal hernia and mod-sev aortic stenosis who presented to [facility] on 12/28/23 for planned TAVR.   Assessment & Plan    Severe AS:  -- S/p TAVR with a 26 mm Edwards Sapien 3 Ultra Resilia  THV via the TF approach on 12/28/23.  -- Post operative echo showed EF 60%, mod RVE, mod MAC with mild MR, normally functioning TAVR with a mean gradient of 7 mmHg and no PVL.   Bifascicular block (RBBB/LAFB): -- Had CHB perioperatively. RIJ pacer left in place.  -- Appreciate EP, plan pacer today.    CAD s/p CABGx2: -- NSTEMI in 09/2023 with DES to RCA. -- Cath 11/21/23 w/ chronic occlusion proximal LAD. Patent LIMA to mid LAD filling the mid and distal LAD. Chronic occlusion ostial Circumflex. Patent vein graft to obtuse marginal branch. Large dominant RCA with patent mid stented segment with no restenosis. -- Continue Asprin 81mg  daily and Plavix  75mg  daily given recent PCI. Okay to hold Plavix  for pacemaker implant per Dr. Wonda.  -- Continue atorvastatin  80mg  daily.    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  -- Continue home Lasix  40mg  daily/KCL 20 meq.  DMT2:  -- Continue SSI  Signed, Lamarr Hummer, PA-C  12/30/2023, 8:22 AM  Pager [phone]  Patient seen, examined. Available data reviewed. Agree with findings, assessment, and plan as outlined by Izetta Hummer, PA-C.  The patient denies chest pain, shortness of breath, or heart palpitations.  The patient is independently interviewed and examined.  He is an elderly male, sitting up at the bedside.  He is alert, oriented, in no distress.  The right IJ sheath and temporary pacemaker are still in place with no hematoma or ecchymosis.  Heart is regular rate and rhythm with no audible murmur today.  Lungs are clear.  Abdomen is soft and nontender.  Extremities have no edema.  The left groin site is clear and the left radial site is clear with no hematoma or ecchymoses.  The patient is awaiting permanent  pacemaker placement today.  He is currently in sinus rhythm but has continued to have some evidence of AV block and 2: 1 AV block on telemetry.  Appreciate EP team consultation and care.  Would anticipate hospital discharge tomorrow as long as no complications arise.  Resume Plavix  when safe from EP perspective likely in the next few days.  Other issues as outlined above.  Ozell Wonda, M.D. 12/30/2023 11:31 AM

## 2023-12-30 NOTE — Progress Notes (Addendum)
 Rounding Note   Patient Name: Sean Villarreal Date of Encounter: 12/30/2023  [facility] Cardiologist: Jayson Sierras, MD   Subjective  No CP, SOB  Scheduled Meds:  aspirin  EC  81 mg Oral Daily   atorvastatin   80 mg Oral Daily   brinzolamide   1 drop Both Eyes TID   And   brimonidine   1 drop Both Eyes TID   chlorhexidine   60 mL Topical Once   Chlorhexidine  Gluconate Cloth  6 each Topical Daily   diphenhydrAMINE   50 mg Oral Once   Or   diphenhydrAMINE   50 mg Intravenous Once   ezetimibe   10 mg Oral QHS   furosemide   40 mg Oral Daily   gentamicin  (GARAMYCIN ) 80 mg in sodium chloride  0.9 % 500 mL irrigation  80 mg Irrigation On Call   insulin  aspart  0-24 Units Subcutaneous TID AC & HS   potassium chloride  SA  20 mEq Oral Daily   predniSONE   50 mg Oral Q6H   sodium chloride  flush  3 mL Intravenous Q12H   sodium chloride  flush  3 mL Intravenous Q12H   tamsulosin   0.4 mg Oral BID   Continuous Infusions:  sodium chloride      sodium chloride  50 mL/hr at 12/30/23 0548    ceFAZolin  (ANCEF ) IV     nitroGLYCERIN      norepinephrine  (LEVOPHED ) Adult infusion     PRN Meds: sodium chloride , acetaminophen  **OR** acetaminophen , fluticasone , morphine  injection, ondansetron  (ZOFRAN ) IV, mouth rinse, oxyCODONE , polyethylene glycol, sodium chloride  flush, sodium chloride  flush, traMADol    Vital Signs  Vitals:   12/30/23 0600 12/30/23 0615 12/30/23 0630 12/30/23 0700  BP: 131/77 139/76 139/81 128/81  Pulse: 81 81 79 83  Resp: 15 14 15 13   Temp:      TempSrc:      SpO2: 95% 95% 95% 96%  Weight:      Height:        Intake/Output Summary (Last 24 hours) at 12/30/2023 0803 Last data filed at 12/30/2023 0600 Gross per 24 hour  Intake --  Output 1600 ml  Net -1600 ml      12/30/2023    5:00 AM 12/29/2023    6:00 AM 12/28/2023    8:14 AM  Last 3 Weights  Weight (lbs) 182 lb 180 lb 12.4 oz 180 lb  Weight (kg) 82.555 kg 82 kg 81.647 kg      Telemetry  SR 80's,  intermittently hsa frequent PACs, occ blocked PACs, no brady, no high AVblock - Personally Reviewed  ECG   No new EKGs - Personally Reviewed  Physical Exam  GEN: No acute distress.   Neck: No JVD Right internal jugular/temp wire site is stable Cardiac: RRR, no murmurs, rubs, or gallops.  Respiratory: CTA b/l. GI: Soft, nontender, non-distended  MS: No edema; No deformity. Neuro:  Nonfocal  Psych: Normal affect   Labs High Sensitivity Troponin:  No results for input(s): TROPONINIHS in the last 720 hours.   Chemistry Recent Labs  Lab 12/26/23 0914 12/29/23 0215 12/30/23 0254  NA 145 138 139  K 4.2 3.6 3.7  CL 102 102 102  CO2 31 28 30   GLUCOSE 166* 156* 132*  BUN 18 18 16   CREATININE 1.08 0.96 0.88  CALCIUM  9.6 8.7* 8.4*  MG  --  1.9  --   PROT 7.2  --   --   ALBUMIN  3.9  --   --   AST 18  --   --   ALT 19  --   --  ALKPHOS 42  --   --   BILITOT 1.0  --   --   GFRNONAA >60 >60 >60  ANIONGAP 12 8 7     Lipids No results for input(s): CHOL, TRIG, HDL, LABVLDL, LDLCALC, CHOLHDL in the last 168 hours.  Hematology Recent Labs  Lab 12/26/23 0914 12/29/23 0215 12/30/23 0254  WBC 8.7 18.3* 11.6*  RBC 5.30 4.62 4.77  HGB 16.4 14.5 14.8  HCT 50.7 43.1 44.5  MCV 95.7 93.3 93.3  MCH 30.9 31.4 31.0  MCHC 32.3 33.6 33.3  RDW 13.2 13.1 13.5  PLT 164 134* 117*   Thyroid  No results for input(s): TSH, FREET4 in the last 168 hours.  BNPNo results for input(s): BNP, PROBNP in the last 168 hours.  DDimer No results for input(s): DDIMER in the last 168 hours.   Radiology    Cardiac Studies  12/29/23: TTE  1. Left ventricular ejection fraction, by estimation, is 60 to 65%. Left  ventricular ejection fraction by 2D MOD biplane is 60.6 %. The left  ventricle has normal function. The left ventricle has no regional wall  motion abnormalities. Left ventricular  diastolic function could not be evaluated.   2. Right ventricular systolic function is  normal. The right ventricular  size is moderately enlarged.   3. The mitral valve is normal in structure. Mild mitral valve  regurgitation. No evidence of mitral stenosis. Moderate mitral annular  calcification.   4. Prosthetic valve well seated with normal leaflet motion and normal  function (PV 1.8 m/sec, MG 7 mmHg, DVI 0.5) and no evidence of  transvalvular or paravalvular regurgitation. The aortic valve has been  repaired/replaced. Aortic valve regurgitation is  not visualized. No aortic stenosis is present. There is a 26 mm Edwards  S3U valve present in the aortic position. Procedure Date: 12/28/23.   5. The inferior vena cava is dilated in size with <50% respiratory  variability, suggesting right atrial pressure of 15 mmHg.   Comparison(s): Prior images reviewed side by side. S/p TAVR. EF has  improved but RV now seems enlarged and IVC dilated.      Cardiac Catheterization: 10/2023   Ost Cx to Prox Cx lesion is 100% stenosed.   Ost LAD to Prox LAD lesion is 99% stenosed.   Mid LM to Ost LAD lesion is 99% stenosed.   Mid LAD lesion is 100% stenosed.   Previously placed Prox RCA to Mid RCA stent of unknown type is  widely patent.   LIMA graft was visualized by angiography and is normal in caliber.   SVG graft was visualized by angiography and is normal in caliber.   Chronic occlusion proximal LAD. Patent LIMA to mid LAD filling the mid and distal LAD Chronic occlusion ostial Circumflex. Patent vein graft to obtuse marginal branch.  Large dominant RCA with patent mid stented segment with no restenosis.  Normal LVEDP Severe low flow/low gradient aortic stenosis by echo.    Recommendations: I suspect that his symptoms are related to his aortic stenosis. Will monitor overnight. Will plan outpatient pre-TAVR CT scans and visit with the CT surgeon and continue down pathway for TAVR.     Patient Profile   83 y.o. 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 With cross of wire developed CHB with long V pause >  temp paced through his procedure. Post procedure appeared to have recovered AV conduction with AVW behavior and his temp wire left at back up of 40. Given his baseline conduction system disease w/RBBB, LAD, EP was asked to evaluate for recommendations possible need for PPM.  Coreg  12.5mg  BID held  Assessment & Plan    Conduction system disease RBBB, LAD Goes back to 2021   S/p TAVR 12/28/23 Intra procedure with wire across the valve developed CHB and was paced through his procedure.   He has regained AV conduction though with evidence of AVW, some brief periods that are more c/w Mobitz II post op evening  His rhythm has been stable with at time frequent PACs and clocked PACs intermittently Variable PR interval No bradycardia No back up pacing requirements    Given his baseline conduction system disease and CHB with initial wire pass, despite regained conduction, post TAVR concerns of recurrent high AV block and recommend PPM Felt to have high risk of recurrent CHB and recommend PPM The patient very much in agreement prefering PPC as well.  Planned for today with Dr. Waddell The patient this morning had no follow up questions regarding procedure/risk/benefit discussion yesterday, remains agreeable to proceed  RBBB, preserved LVEF Contrast allergy w/hives > pre-med ordered/started yesterday in event he needs a venogram  CAD w/DES to RCA Sept 2025 In d//w dr. Wonda, OK to hold plavix  a few days Last dose yesterday 12/29/23 @ 0949     For questions or updates, please contact [facility] Please consult [email or web address] for contact info under    Signed, Charlies Macario Arthur, PA-C  12/30/2023, 8:03 AM    EP attending  Patient seen and examined.  Agree with the findings as noted  above.  The patient is stable status post TAVR after developing intraprocedure complete heart block in the setting of severe conduction system disease.  He has continued to have episodes of AV [employer] and 2-1 heart block though for the most part is conducting one-to-one.  He is felt to be high risk for progression to complete heart block and presents today for insertion of a dual-chamber pacemaker.  On exam he is a pleasant well-appearing 83 year old man in no distress CV reveals an irregular rhythm.  Lungs are clear.  Extremities are warm with no edema.  Telemetry demonstrates sinus rhythm with periods of AV Wenke block and 2-1 AV block.  Assessment and plan 1.  High-grade heart block -  I discussed the treatment options with the patient and the risk, goals, benefits, and expectations of permanent pacemaker insertion were reviewed and he wishes to proceed.  This will be performed later this afternoon.  Danelle Waddell, MD

## 2023-12-30 NOTE — Discharge Instructions (Addendum)
 ACTIVITY AND EXERCISE  Daily activity and exercise are an important part of your recovery. People recover at different rates depending on their general health and type of valve procedure.  Most people recovering from TAVR feel better relatively quickly   No lifting, pushing, pulling more than 10 pounds (examples to avoid: groceries, vacuuming, gardening, golfing):             - For one week with a procedure through the groin.             - For six weeks for procedures through the chest wall or neck. NOTE: You will typically see one of our providers 7-14 days after your procedure to discuss WHEN TO RESUME the above activities.      DRIVING  Do not drive until you are seen for follow up and cleared by a provider. Generally, we ask patient to not drive for 1 week after their procedure.  If you have been told by your doctor in the past that you may not drive, you must talk with him/her before you begin driving again.   DRESSING  Groin site: you may leave the clear dressing over the site until follow up or remove 3-5 days post procedure.   HYGIENE  If you had a femoral (leg) procedure, you may take a shower when you return home. After the shower, pat the site dry. Do NOT use powder, oils or lotions in your groin area until the site has completely healed.   If you had a chest procedure, you may shower when you return home unless specifically instructed not to by your discharging practitioner.             - DO NOT scrub incision; pat dry with a towel.             - DO NOT apply any lotions, oils, powders to the incision.             - No tub baths / swimming for at least 2 weeks.  If you notice any fevers, chills, increased pain, swelling, bleeding or pus, please contact your provider.   ADDITIONAL INFORMATION  If you are going to have an upcoming dental procedure, please contact our office as you will require antibiotics ahead of time to prevent infection on your heart valve.    If you have any  questions or concerns you can call the structural heart phone during normal business hours 8am-4pm. If you have an urgent need after hours or weekends please call [phone] to talk to the on call provider for general cardiology. If you have an emergency that requires immediate attention, please call 911.    After TAVR Checklist  Check  Test Description   Follow up appointment in 1-2 weeks  You will see our structural heart advanced practice provider. Your incision sites will be checked and you will be cleared to drive and resume all normal activities if you are doing well.     1 month echo and follow up (23-75 days) You will have an echo to check on your new heart valve and be seen back in the office by a structural heart advanced practice provider.   Follow up with your primary cardiologist You will need to be seen by your primary cardiologist in the following 3-6 months after your 1 month appointment in the valve clinic.    1 year echo and follow up (60 days +/- the anniversary of your TAVR) You will have another echo  to check on your heart valve after 1 year and be seen back in the office by a structural heart advanced practice provider. This your last structural heart visit.   Bacterial endocarditis prophylaxis  You will have to take antibiotics for the rest of your life before all dental procedures (even teeth cleanings) to protect your heart valve. Antibiotics are also required before some surgeries. Please check with your cardiologist before scheduling any surgeries. Also, please make sure to tell us  if you have a penicillin allergy as you will require an alternative antibiotic.       After Your Pacemaker   You have a Biotronik Pacemaker  If you have a Medtronic or Biotronik device, plug in your home monitor once you get home, and no manual interaction is required.   If you have an Abbott or [employer] device, plug your home monitor once you get home, sit near the device, and  press the large activation button. Sit nearby until the process is complete, usually notated by lights on the monitor.   If you were set up for monitoring using an app on your phone, make sure the app remains open in the background and the Bluetooth remains on.  ACTIVITY Do not lift your arm above shoulder height for 1 week after your procedure. After 7 days, you may progress as below.  You should remove your sling 24 hours after your procedure, unless otherwise instructed by your provider.     Friday January 06, 2024  Saturday January 07, 2024 Sunday January 08, 2024 Monday January 09, 2024   Do not lift, push, pull, or carry anything over 10 pounds with the affected arm until 6 weeks (Friday February 10, 2024 ) after your procedure.   You may drive AFTER your wound check, unless you have been told otherwise by your provider.   Ask your healthcare provider when you can go back to work   INCISION/Dressing If you are on a blood thinner such as Coumadin, Xarelto, Eliquis , Plavix , or Pradaxa please confirm with your provider when this should be resumed.   If large square, outer bandage is left in place, this can be removed after 24 hours from your procedure. Do not remove steri-strips or glue as below.   If a PRESSURE DRESSING (a bulky dressing that usually goes up over your shoulder) was applied or left in place, please follow instructions given by your provider on when to return to have this removed.   Monitor your Pacemaker site for redness, swelling, and drainage. Call the device clinic at [phone] if you experience these symptoms or fever/chills.  If your incision is sealed with Steri-strips or staples, you may shower 7 days after your procedure or when told by your provider. Do not remove the steri-strips or let the shower hit directly on your site. You may wash around your site with soap and water .    If you were discharged in a sling, please do not wear this during the day  more than 48 hours after your surgery unless otherwise instructed. This may increase the risk of stiffness and soreness in your shoulder.   Avoid lotions, ointments, or perfumes over your incision until it is well-healed.  You may use a hot tub or a pool AFTER your wound check appointment if the incision is completely closed.  Pacemaker Alerts:  Some alerts are vibratory and others beep. These are NOT emergencies. Please call our office to let us  know. If this occurs at night or  on weekends, it can wait until the next business day. Send a remote transmission.  If your device is capable of reading fluid status (for heart failure), you will be offered monthly monitoring to review this with you.   DEVICE MANAGEMENT Remote monitoring is used to monitor your pacemaker from home. This monitoring is scheduled every 91 days by our office. It allows us  to keep an eye on the functioning of your device to ensure it is working properly. You will routinely see your Electrophysiologist annually (more often if necessary).  This will appear as a REMOTE check on your MyChart schedule. These are automatic and there is nothing for you to manually do unless otherwise instructed.  You should receive your ID card for your new device in 4-8 weeks. Keep this card with you at all times once received. Consider wearing a medical alert bracelet or necklace.  Your Pacemaker may be MRI compatible. This will be discussed at your next office visit/wound check.  You should avoid contact with strong electric or magnetic fields.   Do not use amateur (ham) radio equipment or electric (arc) welding torches. MP3 player headphones with magnets should not be used. Some devices are safe to use if held at least 12 inches (30 cm) from your Pacemaker. These include power tools, lawn mowers, and speakers. If you are unsure if something is safe to use, ask your health care provider.  When using your cell phone, hold it to the ear that is on  the opposite side from the Pacemaker. Do not leave your cell phone in a pocket over the Pacemaker.  You may safely use electric blankets, heating pads, computers, and microwave ovens.  Call the office right away if: You have chest pain. You feel more short of breath than you have felt before. You feel more light-headed than you have felt before. Your incision starts to open up.  This information is not intended to replace advice given to you by your health care provider. Make sure you discuss any questions you have with your health care provider.

## 2023-12-30 NOTE — Plan of Care (Signed)
  Problem: Education: Goal: Knowledge of General Education information will improve Description: Including pain rating scale, medication(s)/side effects and non-pharmacologic comfort measures Outcome: Progressing   Problem: Health Behavior/Discharge Planning: Goal: Ability to manage health-related needs will improve Outcome: Progressing   Problem: Elimination: Goal: Will not experience complications related to bowel motility Outcome: Not Progressing Goal: Will not experience complications related to urinary retention Outcome: Progressing   Problem: Pain Managment: Goal: General experience of comfort will improve and/or be controlled Outcome: Progressing   Problem: Safety: Goal: Ability to remain free from injury will improve Outcome: Progressing   Problem: Education: Goal: Knowledge of cardiac device and self-care will improve Outcome: Progressing

## 2023-12-31 ENCOUNTER — Inpatient Hospital Stay (HOSPITAL_COMMUNITY)

## 2023-12-31 DIAGNOSIS — Z95810 Presence of automatic (implantable) cardiac defibrillator: Secondary | ICD-10-CM | POA: Diagnosis not present

## 2023-12-31 DIAGNOSIS — I700 Atherosclerosis of aorta: Secondary | ICD-10-CM | POA: Diagnosis not present

## 2023-12-31 LAB — BASIC METABOLIC PANEL WITH GFR
Anion gap: 6 (ref 5–15)
BUN: 14 mg/dL (ref 8–23)
CO2: 28 mmol/L (ref 22–32)
Calcium: 8.7 mg/dL — ABNORMAL LOW (ref 8.9–10.3)
Chloride: 100 mmol/L (ref 98–111)
Creatinine, Ser: 0.92 mg/dL (ref 0.61–1.24)
GFR, Estimated: >60 mL/min (ref >60)
Glucose, Bld: 196 mg/dL — ABNORMAL HIGH (ref 70–99)
Potassium: 3.3 mmol/L — ABNORMAL LOW (ref 3.5–5.1)
Sodium: 134 mmol/L — ABNORMAL LOW (ref 135–145)

## 2023-12-31 LAB — CBC
HCT: 44.7 % (ref 39.0–52.0)
Hemoglobin: 15.1 g/dL (ref 13.0–17.0)
MCH: 31 pg (ref 26.0–34.0)
MCHC: 33.8 g/dL (ref 30.0–36.0)
MCV: 91.8 fL (ref 80.0–100.0)
Platelets: 108 K/uL — ABNORMAL LOW (ref 150–400)
RBC: 4.87 MIL/uL (ref 4.22–5.81)
RDW: 13.2 % (ref 11.5–15.5)
WBC: 17.7 K/uL — ABNORMAL HIGH (ref 4.0–10.5)
nRBC: 0 % (ref 0.0–0.2)

## 2023-12-31 LAB — GLUCOSE, CAPILLARY
Glucose-Capillary: 359 mg/dL — ABNORMAL HIGH (ref 70–99)
Glucose-Capillary: 181 mg/dL — ABNORMAL HIGH (ref 70–99)

## 2023-12-31 LAB — MAGNESIUM: Magnesium: 2.1 mg/dL (ref 1.7–2.4)

## 2023-12-31 NOTE — Progress Notes (Signed)
 Progress Note  Patient Name: Sean Villarreal Date of Encounter: 12/31/2023  Primary Cardiologist: Jayson Sierras, MD   Subjective   No chest pain or sob. Ready to go home.  Inpatient Medications    Scheduled Meds:  aspirin  EC  81 mg Oral Daily   atorvastatin   80 mg Oral Daily   brinzolamide   1 drop Both Eyes TID   And   brimonidine   1 drop Both Eyes TID   ezetimibe   10 mg Oral QHS   furosemide   40 mg Oral Daily   insulin  aspart  0-24 Units Subcutaneous TID AC & HS   potassium chloride  SA  20 mEq Oral Daily   sodium chloride  flush  3 mL Intravenous Q12H   sodium chloride  flush  3 mL Intravenous Q12H   tamsulosin   0.4 mg Oral BID   Continuous Infusions:  sodium chloride      nitroGLYCERIN      norepinephrine  (LEVOPHED ) Adult infusion     PRN Meds: sodium chloride , acetaminophen  **OR** acetaminophen , acetaminophen , fluticasone , morphine  injection, ondansetron  (ZOFRAN ) IV, mouth rinse, oxyCODONE , polyethylene glycol, sodium chloride  flush, traMADol    Vital Signs    Vitals:   12/31/23 0023 12/31/23 0254 12/31/23 0435 12/31/23 0833  BP: (!) 116/58  127/69 106/66  Pulse: 78 77 78 89  Resp: 17  17 17   Temp: 98.5 F (36.9 C)  98.5 F (36.9 C) 98.5 F (36.9 C)  TempSrc: Oral  Oral Oral  SpO2: 97% 97% 97% 96%  Weight:  81.6 kg    Height:        Intake/Output Summary (Last 24 hours) at 12/31/2023 1101 Last data filed at 12/31/2023 0900 Gross per 24 hour  Intake 1431.86 ml  Output 1950 ml  Net -518.14 ml   Filed Weights   12/29/23 0600 12/30/23 0500 12/31/23 0254  Weight: 82 kg 82.6 kg 81.6 kg    Telemetry    Nsr with PVC's. - Personally Reviewed  ECG    NSR with RBBB - Personally Reviewed  Physical Exam   GEN: No acute distress.   Neck: No JVD Cardiac: RRR, no murmurs, rubs, or gallops.  Respiratory: Clear to auscultation bilaterally. GI: Soft, nontender, non-distended  MS: No edema; No deformity. Neuro:  Nonfocal  Psych: Normal affect   Labs     Chemistry Recent Labs  Lab 12/26/23 0914 12/29/23 0215 12/30/23 0254 12/31/23 0416  NA 145 138 139 134*  K 4.2 3.6 3.7 3.3*  CL 102 102 102 100  CO2 31 28 30 28   GLUCOSE 166* 156* 132* 196*  BUN 18 18 16 14   CREATININE 1.08 0.96 0.88 0.92  CALCIUM  9.6 8.7* 8.4* 8.7*  PROT 7.2  --   --   --   ALBUMIN  3.9  --   --   --   AST 18  --   --   --   ALT 19  --   --   --   ALKPHOS 42  --   --   --   BILITOT 1.0  --   --   --   GFRNONAA >60 >60 >60 >60  ANIONGAP 12 8 7 6      Hematology Recent Labs  Lab 12/29/23 0215 12/30/23 0254 12/31/23 0416  WBC 18.3* 11.6* 17.7*  RBC 4.62 4.77 4.87  HGB 14.5 14.8 15.1  HCT 43.1 44.5 44.7  MCV 93.3 93.3 91.8  MCH 31.4 31.0 31.0  MCHC 33.6 33.3 33.8  RDW 13.1 13.5 13.2  PLT 134* 117*  108*    Cardiac EnzymesNo results for input(s): TROPONINI in the last 168 hours. No results for input(s): TROPIPOC in the last 168 hours.   BNPNo results for input(s): BNP, PROBNP in the last 168 hours.   DDimer No results for input(s): DDIMER in the last 168 hours.   Radiology    DG Chest 2 View Result Date: 12/31/2023 EXAM: 2 VIEW(S) XRAY OF THE CHEST 12/31/2023 06:34:00 AM COMPARISON: 12/26/2023 CLINICAL HISTORY: Pacemaker FINDINGS: LUNGS AND PLEURA: No focal pulmonary opacity. Trace pleural effusions. No pneumothorax. HEART AND MEDIASTINUM: Status post TAVR. Aortic arch atherosclerosis. CABG markers. New left chest AICD/pacemaker with leads overlying the right atrium and right ventricle. BONES AND SOFT TISSUES: Median sternotomy wires noted. No acute osseous abnormality. IMPRESSION: 1. New left chest AICD/pacemaker with leads overlying the right atrium and right ventricle. No pneumothorax following pacer placement. . 2. Status post TAVR Electronically signed by: Waddell Calk MD 12/31/2023 07:15 AM EST RP Workstation: [id number]   EP PPM/ICD IMPLANT Result Date: 12/30/2023 CONCLUSIONS:  1. 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 No early apparent complications.       Danelle Waddell, MD 12/30/2023 2:04 PM    Cardiac Studies   See above  Patient Profile     83 y.o. 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  AS - he is s/p TAVR  For questions or updates, please contact [facility] Please consult [email or web address] for contact info under Cardiology/STEMI.      Signed, Danelle Waddell, MD  12/31/2023, 11:01 AM

## 2023-12-31 NOTE — Plan of Care (Signed)

## 2023-12-31 NOTE — Progress Notes (Signed)
 DISCHARGE NOTE HOME DRURY ARDIZZONE to be discharged Home per MD order. Discussed prescriptions and follow up appointments with the patient. Prescriptions given to patient; medication list explained in detail. Patient verbalized understanding.  Skin clean, dry and intact without evidence of skin break down, no evidence of skin tears noted. IV catheter discontinued intact. Site without signs and symptoms of complications. Dressing and pressure applied. Pt denies pain at the site currently. No complaints noted.  Saee LDA for sirgocal  incisionPatient free of lines, drains, and wounds.   An After Visit Summary (AVS) was printed and given to the patient. Patient escorted via wheelchair, and discharged home via private auto.  Peyton SHAUNNA Pepper, RN

## 2024-01-01 ENCOUNTER — Encounter (HOSPITAL_COMMUNITY): Payer: Self-pay | Admitting: Internal Medicine

## 2024-01-02 ENCOUNTER — Telehealth: Payer: Self-pay | Admitting: *Deleted

## 2024-01-02 ENCOUNTER — Other Ambulatory Visit: Payer: Self-pay | Admitting: Physician Assistant

## 2024-01-02 ENCOUNTER — Telehealth: Payer: Self-pay

## 2024-01-02 DIAGNOSIS — K4090 Unilateral inguinal hernia, without obstruction or gangrene, not specified as recurrent: Secondary | ICD-10-CM

## 2024-01-02 MED FILL — Midazolam HCl Inj 2 MG/2ML (Base Equivalent): INTRAMUSCULAR | Qty: 2 | Status: AC

## 2024-01-02 NOTE — Transitions of Care (Post Inpatient/ED Visit) (Signed)
 01/02/2024  Name: Sean Villarreal MRN: [id number] DOB: 1940/12/01  Today's TOC FU Call Status: Today's TOC FU Call Status:: Successful TOC FU Call Completed TOC FU Call Complete Date: 01/02/24  Patient's Name and Date of Birth confirmed. Name, DOB  Transition Care Management Follow-up Telephone Call Date of Discharge: 12/31/23 Discharge Facility: [facility]) Type of Discharge: Inpatient Admission Primary Inpatient Discharge Diagnosis:: S/P TAVR (transcatheter aortic valve replacement How have you been since you were released from the hospital?: Better Any questions or concerns?: No  Items Reviewed: Did you receive and understand the discharge instructions provided?: Yes Medications obtained,verified, and reconciled?: Yes (Medications Reviewed) Any new allergies since your discharge?: No Dietary orders reviewed?: No Do you have support at home?: Yes People in Home [RPT]: alone Name of Support/Comfort Primary Source: [place]  Medications Reviewed Today: Medications Reviewed Today     Reviewed by Kennieth Cathlean DEL, RN (Case Manager) on 01/02/24 at 1440  Med List Status: <None>   Medication Order Taking? Sig Documenting Provider Last Dose Status Informant  acetaminophen  (TYLENOL ) 500 MG tablet [id number] Yes Take 1,000 mg by mouth every 6 (six) hours as needed for fever or headache (pain). [provider]  Active Self, Pharmacy Records  aspirin  EC 81 MG tablet [id number] Yes Take 81 mg by mouth daily. [provider]  Active Self, Pharmacy Records  atorvastatin  (LIPITOR ) 80 MG tablet [id number] Yes Take 1 tablet (80 mg total) by mouth daily. Johnson Laymon HERO, PA-C  Active Self, Pharmacy Records  carvedilol  (COREG ) 12.5 MG tablet [id number] Yes Take 1 tablet (12.5 mg total) by mouth 2 (two) times daily. Debera Jayson MATSU, MD  Active Self, Pharmacy Records  clopidogrel  (PLAVIX ) 75 MG tablet [id number] Yes Take 1 tablet (75 mg total) by mouth daily. Debera Jayson MATSU, MD  Active Self, Pharmacy Records  dapagliflozin  propanediol (FARXIGA ) 10 MG TABS tablet [id number] Yes Take 1 tablet (10 mg total) by mouth daily before breakfast. Johnson Laymon HERO, PA-C  Active Self, Pharmacy Records  Dulaglutide  (TRULICITY ) 0.75 MG/0.5ML EMMANUEL [id number] Yes Inject 0.75 mg as directed once a week.  Patient taking differently: Inject 0.75 mg as directed every Wednesday.     Active Self, Pharmacy Records  ezetimibe  (ZETIA ) 10 MG tablet [id number] Yes Take 1 tablet (10 mg total) by mouth at bedtime. Johnson Laymon HERO, PA-C  Active Self, Pharmacy Records  fluticasone  (FLONASE ) 50 MCG/ACT nasal spray [id number] Yes Place 1 spray into both nostrils daily as needed for allergies or rhinitis. [provider]  Active Self, Pharmacy Records  furosemide  (LASIX ) 40 MG tablet [id number] Yes Take 1 tablet (40 mg total) by mouth daily. Debera Jayson MATSU, MD  Active Self, Pharmacy Records  glimepiride  (AMARYL ) 1 MG tablet [id number] Yes Take 1 tablet (1 mg total) by mouth daily.   Active Self, Pharmacy Records  latanoprost  (XALATAN ) 0.005 % ophthalmic solution [id number] Yes Place 1 drop into the left eye at bedtime. [provider]  Active Self, Pharmacy Records  metFORMIN  (GLUCOPHAGE ) 1000 MG tablet [id number] Yes Take 1 tablet (1,000 mg total) by mouth 2 (two) times daily.   Active Self, Pharmacy Records  nitroGLYCERIN  (NITROSTAT ) 0.4 MG SL tablet [id number] Yes Place 0.4 mg under the tongue every 5 (five) minutes x 3 doses as needed for chest pain. [provider]  Active Self, Pharmacy Records  potassium chloride  SA (KLOR-CON  M) 20 MEQ tablet [id number] Yes Take 1 tablet (20 mEq total) by mouth daily. Miriam Norris, NP  Active Self,  Pharmacy Records  SIMBRINZA 1-0.2 % SUSP [id number] Yes Place 1 drop into the left eye 2 (two) times daily. [provider]  Active Self, Pharmacy Records  spironolactone  (ALDACTONE ) 25 MG tablet [id number] Yes Take 0.5  tablets (12.5 mg total) by mouth daily. Johnson Laymon HERO, PA-C  Active Self, Pharmacy Records  tamsulosin  (FLOMAX ) 0.4 MG CAPS capsule [id number]  Take 1 capsule (0.4 mg total) by mouth 2 (two) times daily.   Active Self, Pharmacy Records            Home Care and Equipment/Supplies: Were Home Health Services Ordered?: NA Any new equipment or medical supplies ordered?: NA  Functional Questionnaire: Do you need assistance with bathing/showering or dressing?: No Do you need assistance with meal preparation?: Yes Do you need assistance with eating?: No Do you have difficulty maintaining continence: No Do you need assistance with getting out of bed/getting out of a chair/moving?: No Do you have difficulty managing or taking your medications?: No  Follow up appointments reviewed: PCP Follow-up appointment confirmed?: No MD Provider Line [phone] Given: No Specialist Hospital Follow-up appointment confirmed?: Yes Date of Specialist follow-up appointment?: 01/04/24 Follow-Up Specialty Provider:: Dr. Debera on 01/04/24 at 10:40AM   1217 wound check pacer 130   0115 Lamarr Hummer 1:30 heartcare Do you need transportation to your follow-up appointment?: No Do you understand care options if your condition(s) worsen?: Yes-patient verbalized understanding  SDOH Interventions Today    Flowsheet Row Most Recent Value  SDOH Interventions   Food Insecurity Interventions Intervention Not Indicated  Housing Interventions Intervention Not Indicated  Transportation Interventions Intervention Not Indicated, Patient Resources (Friends/Family)  Utilities Interventions Intervention Not Indicated   Discussed and offered 30 day TOC program.  Patient   declined.  The patient has been provided with contact information for the care management team and has been advised to call with any health -related questions or concerns.  The patient verbalized understanding with current plan of care.   The patient is directed to their insurance card regarding availability of benefits coverage  Cathlean Headland BSN RN [facility] Health Care Management Coordinator [email or web address] Direct Dial: [phone]  Fax: [phone] Website: [email or web address]

## 2024-01-02 NOTE — Progress Notes (Addendum)
 Pt requested a surgical referral from within [facility] for his symptomatic inguinal hernia. Referral sent to [facility] surgery in [facility].

## 2024-01-02 NOTE — Telephone Encounter (Signed)
 Patient contacted regarding discharge from [facility] on 12/31/23  Patient understands to follow up with provider Dr. Debera on 01/04/24 at 10:40AM at [facility] in [place].   Patient understands discharge instructions? Yes Patient understands medications and regiment? Yes Patient understands to bring all medications to this visit? Yes  Patient also made aware of the referral to [facility] surgery for his hernia by Izetta Hummer.

## 2024-01-04 ENCOUNTER — Encounter: Payer: Self-pay | Admitting: Cardiology

## 2024-01-04 ENCOUNTER — Ambulatory Visit: Attending: Cardiology | Admitting: Cardiology

## 2024-01-04 VITALS — BP 138/80 | HR 82 | Ht 69.0 in | Wt 182.2 lb

## 2024-01-04 DIAGNOSIS — K4090 Unilateral inguinal hernia, without obstruction or gangrene, not specified as recurrent: Secondary | ICD-10-CM | POA: Insufficient documentation

## 2024-01-04 DIAGNOSIS — I10 Essential (primary) hypertension: Secondary | ICD-10-CM | POA: Diagnosis present

## 2024-01-04 DIAGNOSIS — I442 Atrioventricular block, complete: Secondary | ICD-10-CM | POA: Diagnosis present

## 2024-01-04 DIAGNOSIS — I25119 Atherosclerotic heart disease of native coronary artery with unspecified angina pectoris: Secondary | ICD-10-CM | POA: Diagnosis present

## 2024-01-04 DIAGNOSIS — Z952 Presence of prosthetic heart valve: Secondary | ICD-10-CM | POA: Diagnosis present

## 2024-01-04 DIAGNOSIS — I5020 Unspecified systolic (congestive) heart failure: Secondary | ICD-10-CM | POA: Insufficient documentation

## 2024-01-04 NOTE — Progress Notes (Signed)
 Cardiology Office Note  Date: 01/04/2024   ID: Sean Villarreal, DOB 1940-01-31, MRN [id number]  History of Present Illness: Sean Villarreal is an 83 y.o. male seen recently in November by Ms. Strader PA-C, I reviewed her note as well as extensive records since our last visit in September.  He was just recently discharged from [facility] having undergone TAVR as discussed below and also pacemaker implantation.  He states he has noticed a significant improvement in dyspnea on exertion following the procedure, currently NYHA class II.  No exertional chest pain and no peripheral edema.  His main complaint is of intermittent discomfort related to an apparent right inguinal hernia.  He did have an ultrasound of this region given that he had undergone cardiac catheterization, no finding of pseudoaneurysm or other vascular structure, concern was that this represented a hernia or fat pad.  He did undergo previous left inguinal hernia repair with Dr. Kallie in 2022.  Now he has been just trying to use a binder.  He would like to see a surgeon for follow-up, but does realize that he cannot undergo surgery at this point given need to stay on Plavix .  We went over his medications, he reports compliance with current regimen.  I did review his interval testing and we discussed his echocardiogram which shows normalization of LVEF at 60 to 65%, TAVR prosthesis is functioning normally.  He will likely be seeing Dr. Almetta now for EP follow-up.  He has a pacer wound check in the near future.  I reviewed his ECG today which shows sinus rhythm with right bundle branch block, left anterior fascicular block, and a single ventricular paced beat.  Physical Exam: VS:  BP 138/80 (BP Location: Right Arm)   Pulse 82   Ht 5' 9 (1.753 m)   Wt 182 lb 3.2 oz (82.6 kg)   SpO2 99%   BMI 26.91 kg/m , BMI Body mass index is 26.91 kg/m.  Wt Readings from Last 3 Encounters:  01/04/24 182 lb 3.2 oz (82.6 kg)   12/31/23 179 lb 12.8 oz (81.6 kg)  12/15/23 178 lb (80.7 kg)    General: Patient appears comfortable at rest. HEENT: Conjunctiva and lids normal. Neck: Supple, no elevated JVP or carotid bruits. Thorax: Stable left-sided device pocket site with Steri-Strips in place. Lungs: Clear to auscultation, nonlabored breathing at rest. Cardiac: Regular rate and rhythm, no S3, 1/6 systolic murmur, no pericardial rub. Abdomen: Soft, bowel sounds present. Extremities: No pitting edema, distal pulses 2+.  ECG:  An ECG dated 12/31/2023 was personally reviewed today and demonstrated:  Sinus rhythm with right bundle branch block and left anterior fascicular block.  Labwork: 11/18/2023: TSH 1.250 12/26/2023: ALT 19; AST 18 12/31/2023: BUN 14; Creatinine, Ser 0.92; Hemoglobin 15.1; Magnesium  2.1; Platelets 108; Potassium 3.3; Sodium 134     Component Value Date/Time   CHOL 111 11/22/2023 0229   TRIG 50 11/22/2023 0229   HDL 55 11/22/2023 0229   CHOLHDL 2.0 11/22/2023 0229   VLDL 10 11/22/2023 0229   LDLCALC 46 11/22/2023 0229   Other Studies Reviewed Today:  Echocardiogram 12/29/2023:  1. Left ventricular ejection fraction, by estimation, is 60 to 65%. Left  ventricular ejection fraction by 2D MOD biplane is 60.6 %. The left  ventricle has normal function. The left ventricle has no regional wall  motion abnormalities. Left ventricular  diastolic function could not be evaluated.   2. Right ventricular systolic function is normal. The right ventricular  size  is moderately enlarged.   3. The mitral valve is normal in structure. Mild mitral valve  regurgitation. No evidence of mitral stenosis. Moderate mitral annular  calcification.   4. Prosthetic valve well seated with normal leaflet motion and normal  function (PV 1.8 m/sec, MG 7 mmHg, DVI 0.5) and no evidence of  transvalvular or paravalvular regurgitation. The aortic valve has been  repaired/replaced. Aortic valve regurgitation is  not  visualized. No aortic stenosis is present. There is a 26 mm Edwards  S3U valve present in the aortic position. Procedure Date: 12/28/23.   5. The inferior vena cava is dilated in size with <50% respiratory  variability, suggesting right atrial pressure of 15 mmHg.   Assessment and Plan:  1.  CAD status post DES to the LAD and circumflex in 2014, angioplasty of the circumflex for treatment of in-stent restenosis in 2018, DES x 2 to the proximal to mid circumflex in 2018, and ultimately CABG with LIMA to LAD and SVG to OM in December 2021.  He was evaluated in September at [facility] with NSTEMI and underwent DES intervention to the mid RCA.  He was then readmitted in October at [facility] with recurrent NSTEMI that was managed medically based on relatively stable coronary, stent and bypass graft anatomy.  Symptoms were felt to be most likely related to aortic stenosis at that time.  He does not describe any angina at this time.  Current regimen includes aspirin  81 mg daily, Plavix  75 mg daily, Lipitor  80 mg daily, Zetia  10 mg daily, and as needed nitroglycerin .  One year course of DAPT recommended at [facility] following his RCA DES intervention in September.   2.  HFrecEF, recent echocardiogram shows normalization of LVEF at 60 to 65%.  Continue Coreg  12.5 mg twice daily, Farxiga  10 mg daily, Aldactone  12.5 mg daily, and Lasix  40 mg daily with potassium supplement.   3.  Degenerative calcific aortic stenosis, now status post TAVR with 26 mm Edwards SAPIEN 3 prosthesis on December 3.  Subsequent echocardiogram on December 4 showed normally functioning prosthesis with no significant paravalvular regurgitation and mean AV gradient 7 mmHg.  Continue antiplatelet regimen.  4.  Primary hypertension.  No changes made to current regimen today.  5.  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 Waddell on December 5.  Plavix  held temporarily but resumed today.  6.  Apparent right inguinal hernia, intermittently symptomatic.  Has previous history of left inguinal hernia repair by Dr. Kallie in 2022.  He would like to see surgery again to discuss options, but does realize that he cannot undergo surgery now given need to continue Plavix  after his RCA DES intervention in September at [facility].  Will arrange consultation.  Disposition:  Follow up 3 months.  Signed, Jayson JUDITHANN Sierras, M.D., F.A.C.C. [facility]

## 2024-01-04 NOTE — Patient Instructions (Addendum)
 Medication Instructions:  Your physician recommends that you continue on your current medications as directed. Please refer to the Current Medication list given to you today.  Labwork: none  Testing/Procedures: none  Follow-Up: Your physician recommends that you schedule a follow-up appointment in: 3 months  Any Other Special Instructions Will Be Listed Below (If Applicable). You have been referred to Dr. Morna Pander at [facility].  If you need a refill on your cardiac medications before your next appointment, please call your pharmacy.

## 2024-01-10 NOTE — Progress Notes (Unsigned)
°  Electrophysiology Office Note:   Date:  01/11/2024  ID:  Sean Villarreal, DOB 12-Dec-1940, MRN [id number]  Primary Cardiologist: Jayson Sierras, MD Primary Heart Failure: None Electrophysiologist: Danelle Birmingham, MD       History of Present Illness:   Sean Villarreal is a 83 y.o. 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  Since last being seen in our clinic the patient reports doing well overall. States he is glad he went ahead and made the decision to get the PPM after all. He reports he is a bit bruised. He has not showered since the implant and is anxious to get the steri strips off.      He denies chest pain, palpitations, dyspnea, PND, orthopnea, nausea, vomiting, dizziness, syncope, edema, weight gain, or early satiety.    Review of systems complete and found to be negative unless listed in HPI.    EP Information / Studies Reviewed:    EKG is not ordered today. EKG from 01/04/24 reviewed which showed SR 82 bpm, RBBB      PPM Interrogation-  reviewed in detail today,  See PACEART report.  Device History: Biotronik Dual Chamber PPM implanted 12/30/23 for Second Degree AV block            Physical Exam:   VS:  BP 128/78   Pulse 88   Ht 5' 9 (1.753 m)   Wt 183 lb (83 kg)   SpO2 98%   BMI 27.02 kg/m    Wt Readings from Last 3 Encounters:  01/11/24 183 lb (83 kg)  01/04/24 182 lb 3.2 oz (82.6 kg)  12/31/23 179 lb 12.8 oz (81.6 kg)     GEN: Well nourished, well developed in no acute distress NECK: No JVD; No carotid bruits CARDIAC: Regular rate and rhythm, no murmurs, rubs, gallops, steri strips removed, device site well healed, no hematoma, purple ecchymosis in left lower chest / bruising only in stages of healing RESPIRATORY:  Clear to auscultation without rales, wheezing or rhonchi  ABDOMEN: Soft, non-tender, non-distended EXTREMITIES:   No edema; No deformity   ASSESSMENT AND PLAN:    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 Nancey   AS s/p TAVR  -per Structural Heart  -ECHO 12/29/23 showed valve well seated with normal motion  Hypertension  -well controlled on current regimen   Disposition:   Follow up with Dr. Mealor at 90 day mark for device check  > transition to new EP MD reviewed with patient  Signed, Daphne Barrack, NP-C, AGACNP-BC [facility] - Electrophysiology  01/11/2024, 5:31 PM

## 2024-01-11 ENCOUNTER — Ambulatory Visit: Attending: Pulmonary Disease | Admitting: Pulmonary Disease

## 2024-01-11 VITALS — BP 128/78 | HR 88 | Ht 69.0 in | Wt 183.0 lb

## 2024-01-11 DIAGNOSIS — I442 Atrioventricular block, complete: Secondary | ICD-10-CM | POA: Insufficient documentation

## 2024-01-11 DIAGNOSIS — Z952 Presence of prosthetic heart valve: Secondary | ICD-10-CM | POA: Insufficient documentation

## 2024-01-11 DIAGNOSIS — I350 Nonrheumatic aortic (valve) stenosis: Secondary | ICD-10-CM | POA: Diagnosis present

## 2024-01-11 DIAGNOSIS — I10 Essential (primary) hypertension: Secondary | ICD-10-CM | POA: Insufficient documentation

## 2024-01-11 LAB — CUP PACEART INCLINIC DEVICE CHECK
Battery Remaining Percentage: 100 %
Brady Statistic AP VP Percent: 0 %
Brady Statistic AP VS Percent: 7 %
Brady Statistic AS VP Percent: 5 %
Brady Statistic AS VS Percent: 88 %
Brady Statistic RA Percent Paced: 7 %
Brady Statistic RV Percent Paced: 5 %
Date Time Interrogation Session: 20251217134552
Implantable Lead Connection Status: 753985
Implantable Lead Connection Status: 753985
Implantable Lead Implant Date: 20251205
Implantable Lead Implant Date: 20251205
Implantable Lead Location: 753859
Implantable Lead Location: 753860
Implantable Lead Model: 377
Implantable Lead Model: 377
Implantable Lead Serial Number: 8002181736
Implantable Lead Serial Number: 8002241551
Implantable Pulse Generator Implant Date: 20251205
Lead Channel Impedance Value: 488 Ohm
Lead Channel Impedance Value: 605 Ohm
Lead Channel Pacing Threshold Amplitude: 1 V
Lead Channel Pacing Threshold Amplitude: 1.1 V
Lead Channel Pacing Threshold Pulse Width: 0.4 ms
Lead Channel Pacing Threshold Pulse Width: 0.4 ms
Lead Channel Setting Pacing Amplitude: 3 V
Lead Channel Setting Pacing Amplitude: 3 V
Lead Channel Setting Pacing Pulse Width: 0.4 ms
Pulse Gen Serial Number: 1000661575

## 2024-01-11 NOTE — Patient Instructions (Signed)
 Medication Instructions:  Your physician recommends that you continue on your current medications as directed. Please refer to the Current Medication list given to you today.  *If you need a refill on your cardiac medications before your next appointment, please call your pharmacy*  Lab Work: None ordered If you have labs (blood work) drawn today and your tests are completely normal, you will receive your results only by: MyChart Message (if you have MyChart) OR A paper copy in the mail If you have any lab test that is abnormal or we need to change your treatment, we will call you to review the results.  Follow-Up: At [facility], you and your health needs are our priority.  As part of our continuing mission to provide you with exceptional heart care, our providers are all part of one team.  This team includes your primary Cardiologist (physician) and Advanced Practice Providers or APPs (Physician Assistants and Nurse Practitioners) who all work together to provide you with the care you need, when you need it.  Your next appointment:   As scheduled

## 2024-01-16 ENCOUNTER — Other Ambulatory Visit (HOSPITAL_BASED_OUTPATIENT_CLINIC_OR_DEPARTMENT_OTHER): Payer: Self-pay

## 2024-01-25 ENCOUNTER — Ambulatory Visit: Payer: Self-pay | Admitting: Cardiovascular Disease

## 2024-01-30 ENCOUNTER — Other Ambulatory Visit (HOSPITAL_BASED_OUTPATIENT_CLINIC_OR_DEPARTMENT_OTHER): Payer: Self-pay

## 2024-01-30 ENCOUNTER — Other Ambulatory Visit: Payer: Self-pay

## 2024-01-30 MED ORDER — SIMBRINZA 1-0.2 % OP SUSP
OPHTHALMIC | 3 refills | Status: DC
  Filled 2024-01-30: qty 8, 80d supply, fill #0

## 2024-01-30 MED ORDER — LATANOPROST 0.005 % OP SOLN
OPHTHALMIC | 3 refills | Status: DC
  Filled 2024-01-30: qty 2.5, 50d supply, fill #0

## 2024-02-07 ENCOUNTER — Ambulatory Visit (INDEPENDENT_AMBULATORY_CARE_PROVIDER_SITE_OTHER): Admitting: General Surgery

## 2024-02-07 ENCOUNTER — Encounter: Payer: Self-pay | Admitting: General Surgery

## 2024-02-07 ENCOUNTER — Other Ambulatory Visit: Payer: Self-pay

## 2024-02-07 ENCOUNTER — Other Ambulatory Visit (HOSPITAL_BASED_OUTPATIENT_CLINIC_OR_DEPARTMENT_OTHER): Payer: Self-pay

## 2024-02-07 VITALS — BP 135/87 | HR 82 | Temp 97.6°F | Resp 18 | Ht 69.0 in | Wt 183.0 lb

## 2024-02-07 DIAGNOSIS — Z85828 Personal history of other malignant neoplasm of skin: Secondary | ICD-10-CM | POA: Insufficient documentation

## 2024-02-07 DIAGNOSIS — Z08 Encounter for follow-up examination after completed treatment for malignant neoplasm: Secondary | ICD-10-CM | POA: Insufficient documentation

## 2024-02-07 DIAGNOSIS — L570 Actinic keratosis: Secondary | ICD-10-CM | POA: Insufficient documentation

## 2024-02-07 DIAGNOSIS — K4090 Unilateral inguinal hernia, without obstruction or gangrene, not specified as recurrent: Secondary | ICD-10-CM | POA: Diagnosis not present

## 2024-02-07 DIAGNOSIS — R1031 Right lower quadrant pain: Secondary | ICD-10-CM | POA: Diagnosis not present

## 2024-02-07 DIAGNOSIS — C44319 Basal cell carcinoma of skin of other parts of face: Secondary | ICD-10-CM | POA: Insufficient documentation

## 2024-02-07 MED ORDER — LATANOPROST 0.005 % OP SOLN
1.0000 [drp] | Freq: Every day | OPHTHALMIC | 3 refills | Status: AC
  Filled 2024-02-07: qty 5, 100d supply, fill #0

## 2024-02-07 MED ORDER — SIMBRINZA 1-0.2 % OP SUSP
1.0000 [drp] | Freq: Two times a day (BID) | OPHTHALMIC | 3 refills | Status: AC
  Filled 2024-02-07: qty 8, 80d supply, fill #0

## 2024-02-07 NOTE — Patient Instructions (Signed)
 Monitor the area.  Wear your binder. Go to the Ed if the area is stuck out and hard/painful.  The Ed will need to get  this pushed back in. Call with changes. Will follow up with you in June to see how things are progressing. Will send Dr. Debera a message about the plavix  timing and get his exact recommendations.

## 2024-02-07 NOTE — Progress Notes (Signed)
 [facility] History and Physical  Reason for Referral: Right inguinal hernia  Referring Physician:  Dr. Debera   Chief Complaint   New Patient (Initial Visit)     Sean Villarreal is a 84 y.o. male.  HPI:   Discussed the use of AI scribe software for clinical note transcription with the patient, who gave verbal consent to proceed.  History of Present Illness Sean Villarreal is an 84 year old male with coronary artery disease status post recent NSTEMI and right femoral cardiac catheterization who presents with persistent right groin pain and bulge. He also had an aortic valve replacement and pacemaker in December 2025. He needs to stay on his Plavix  uninterrupted for 1 year is his report.   He developed persistent right groin pain after right femoral cardiac catheterization for NSTEMI in October 2025. The pain is present daily, worsened by ambulation, and alleviated by wearing a brace or pad around the waist. Acetaminophen  provides partial relief. Without the brace, the pain is consistently present. He remains able to perform daily activities, including riding a surveyor, mining.  He notes a bulge in the right groin that protrudes consistently and only reduces when supine. The bulge has become more prominent since the procedure. He is able to manually reduce the bulge by lying down or applying pressure, and sometimes uses his hand to maintain reduction. The area can feel firm after removing the brace or after showering, but softens after lying down. He has adapted his clothing to minimize discomfort. He denies symptoms of bowel obstruction, including severe constipation, diarrhea, hematochezia, nausea, vomiting, fevers, or chills. He reports occasional mild constipation or diarrhea, but no severe episodes since the catheterization.  Ultrasound of the right groin after symptom onset showed a small area indeterminate for hernia. CT scan performed the following day did not reveal  an obvious hernia but demonstrated inflammation in the area of catheterization. He notes the bulge is not always visible, especially when supine, and imaging was performed in the supine position.  He is currently taking clopidogrel  (initiated September 2025 after stent placement), dapagliflozin , and dulaglutide . He has been advised by his cardiologist to continue clopidogrel  for at least one year. He has a history of left inguinal hernia repair approximately three years ago.    Past Medical History:  Diagnosis Date   Aortic stenosis    a. 03/2022 Echo: EF 60-65%, no rwma, mild LVH, nl RV fxn, mild-mod AS.   Arthritis    Basal cell carcinoma    Coronary artery disease    a. DES to LAD and LCx April 2014 b. PTCA ostial circumflex 03/2016 due to ISR c.  9/18 PCI/DESx2 osital Lcx (ISR), p/mLCx  d. s/p CABG on 01/24/2020 with LIMA-LAD and SVG-OM.   Essential hypertension    GERD (gastroesophageal reflux disease)    Hematuria    History of blood transfusion 09/2012   History of kidney stones    Hyperlipidemia LDL goal <70    NSTEMI (non-ST elevated myocardial infarction) (HCC) 08/2012   Post-op Afib (HCC)    a. 12/2019-01/2020 following CABG. Briefly on amio.   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 Wonda and Dr. Daniel   Type II diabetes mellitus [facility])     Past Surgical History:  Procedure Laterality Date   BASAL CELL CARCINOMA EXCISION     right cheek; both shoulders  CARDIAC CATHETERIZATION     CATARACT EXTRACTION W/ INTRAOCULAR LENS  IMPLANT, BILATERAL Bilateral    COLONOSCOPY N/A 03/19/2016   Procedure: COLONOSCOPY;  Surgeon: Claudis RAYMOND Rivet, MD;  Location: [facility];  Service: Endoscopy;  Laterality: N/A;  9:15   CORONARY ARTERY BYPASS GRAFT N/A 01/24/2020   Procedure: CORONARY ARTERY BYPASS GRAFTING (CABG), ON PUMP, TIMES TWO, USING LEFT INTERNAL MAMMARY ARTERY AND  ENDOSCOPICALLY HARVESTED RIGHT GREATER SAPHENOUS VEIN;  Surgeon: Kerrin Elspeth BROCKS, MD;  Location: [facility] OR;  Service: Open Heart Surgery;  Laterality: N/A;   CORONARY BALLOON ANGIOPLASTY N/A 04/15/2016   Procedure: Coronary Balloon Angioplasty;  Surgeon: Peter M Jordan, MD;  Location: [facility] INVASIVE CV LAB;  Service: Cardiovascular;  Laterality: N/A;   CORONARY STENT INTERVENTION N/A 10/11/2016   Procedure: CORONARY STENT INTERVENTION;  Surgeon: Dann Candyce RAMAN, MD;  Location: [facility] INVASIVE CV LAB;  Service: Cardiovascular;  Laterality: N/A;   CYSTOSCOPY N/A 12/30/2022   Procedure: CYSTOSCOPY;  Surgeon: Sherrilee Belvie CROME, MD;  Location: [facility] ORS;  Service: Urology;  Laterality: N/A;   CYSTOSCOPY W/ URETEROSCOPY W/ LITHOTRIPSY  10/2012   /notes 11/10/2012   CYSTOSCOPY WITH STENT PLACEMENT  08/2012; 09/2012   EYE SURGERY Left ~ 02/2016   for crinkled lens   INGUINAL HERNIA REPAIR Left 12/24/2020   Procedure: HERNIA REPAIR INGUINAL WITH MESH;  Surgeon: Kallie Manuelita BROCKS, MD;  Location: [facility] ORS;  Service: General;  Laterality: Left;   INTRAOPERATIVE TRANSTHORACIC ECHOCARDIOGRAM N/A 12/28/2023   Procedure: ECHOCARDIOGRAM, TRANSTHORACIC;  Surgeon: Wonda Sharper, MD;  Location: [facility] INVASIVE CV LAB;  Service: Cardiovascular;  Laterality: N/A;   LEFT HEART CATH AND CORONARY ANGIOGRAPHY N/A 04/15/2016   Procedure: Left Heart Cath and Coronary Angiography;  Surgeon: Peter M Jordan, MD;  Location: [facility] INVASIVE CV LAB;  Service: Cardiovascular;  Laterality: N/A;   LEFT HEART CATH AND CORONARY ANGIOGRAPHY N/A 10/11/2016   Procedure: LEFT HEART CATH AND CORONARY ANGIOGRAPHY;  Surgeon: Dann Candyce RAMAN, MD;  Location: [facility] INVASIVE CV LAB;  Service: Cardiovascular;  Laterality: N/A;   LEFT HEART CATH AND CORONARY ANGIOGRAPHY N/A 03/31/2018   Procedure: LEFT HEART CATH AND CORONARY ANGIOGRAPHY;  Surgeon: Burnard Debby LABOR, MD;  Location: [facility] INVASIVE CV LAB;  Service: Cardiovascular;  Laterality: N/A;   LEFT HEART CATH  AND CORONARY ANGIOGRAPHY N/A 01/21/2020   Procedure: LEFT HEART CATH AND CORONARY ANGIOGRAPHY;  Surgeon: Claudene Victory ORN, MD;  Location: [facility] INVASIVE CV LAB;  Service: Cardiovascular;  Laterality: N/A;   LEFT HEART CATH AND CORONARY ANGIOGRAPHY N/A 11/21/2023   Procedure: LEFT HEART CATH AND CORONARY ANGIOGRAPHY;  Surgeon: Verlin Lonni BIRCH, MD;  Location: [facility] INVASIVE CV LAB;  Service: Cardiovascular;  Laterality: N/A;   LEFT HEART CATHETERIZATION WITH CORONARY ANGIOGRAM N/A 05/05/2012   Procedure: LEFT HEART CATHETERIZATION WITH CORONARY ANGIOGRAM;  Surgeon: Lonni BIRCH Verlin, MD;  Location: [facility] CATH LAB;  Service: Cardiovascular;  Laterality: N/A;   LEFT HEART CATHETERIZATION WITH CORONARY ANGIOGRAM N/A 05/15/2012   Procedure: LEFT HEART CATHETERIZATION WITH CORONARY ANGIOGRAM;  Surgeon: Lonni BIRCH Verlin, MD;  Location: [facility] CATH LAB;  Service: Cardiovascular;  Laterality: N/A;   PACEMAKER IMPLANT N/A 12/30/2023   Procedure: PACEMAKER IMPLANT;  Surgeon: Waddell Danelle ORN, MD;  Location: [facility] INVASIVE CV LAB;  Service: Cardiovascular;  Laterality: N/A;   TEE WITHOUT CARDIOVERSION N/A 01/24/2020   Procedure: TRANSESOPHAGEAL ECHOCARDIOGRAM (TEE);  Surgeon: Kerrin Elspeth BROCKS, MD;  Location: [facility] OR;  Service: Open Heart Surgery;  Laterality: N/A;   TONSILLECTOMY  1947  TRANSURETHRAL RESECTION OF PROSTATE N/A 12/30/2022   Procedure: TRANSURETHRAL RESECTION OF THE PROSTATE (TURP);  Surgeon: Sherrilee Belvie CROME, MD;  Location: [facility] ORS;  Service: Urology;  Laterality: N/A;    Family History  Problem Relation Age of Onset   Heart disease Mother    Heart failure Mother    Heart disease Father    Heart attack Father    Colon cancer Neg Hx     Social History[1]  Medications: I have reviewed the patient's current medications. Allergies as of 02/07/2024       Reactions   Contrast Media [iodinated Contrast Media] Hives   Jardiance  [empagliflozin ] Diarrhea   Penicillins Other (See Comments)    Syncope  Childhood reaction        Medication List        Accurate as of February 07, 2024  9:23 AM. If you have any questions, ask your nurse or doctor.          STOP taking these medications    latanoprost  0.005 % ophthalmic solution Commonly known as: XALATAN  Stopped by: Manuelita Pander, MD   Simbrinza  1-0.2 % Susp Generic drug: Brinzolamide -Brimonidine  Stopped by: Manuelita Pander, MD       TAKE these medications    acetaminophen  500 MG tablet Commonly known as: TYLENOL  Take 1,000 mg by mouth every 6 (six) hours as needed for fever or headache (pain).   aspirin  EC 81 MG tablet Take 81 mg by mouth daily.   atorvastatin  80 MG tablet Commonly known as: LIPITOR  Take 1 tablet (80 mg total) by mouth daily.   carvedilol  12.5 MG tablet Commonly known as: COREG  Take 1 tablet (12.5 mg total) by mouth 2 (two) times daily.   clopidogrel  75 MG tablet Commonly known as: PLAVIX  Take 1 tablet (75 mg total) by mouth daily.   ezetimibe  10 MG tablet Commonly known as: ZETIA  Take 1 tablet (10 mg total) by mouth at bedtime.   Farxiga  10 MG Tabs tablet Generic drug: dapagliflozin  propanediol Take 1 tablet (10 mg total) by mouth daily before breakfast.   fluticasone  50 MCG/ACT nasal spray Commonly known as: FLONASE  Place 1 spray into both nostrils daily as needed for allergies or rhinitis.   furosemide  40 MG tablet Commonly known as: LASIX  Take 1 tablet (40 mg total) by mouth daily.   glimepiride  1 MG tablet Commonly known as: AMARYL  Take 1 tablet (1 mg total) by mouth daily.   Klor-Con  M20 20 MEQ tablet Generic drug: potassium chloride  SA Take 1 tablet (20 mEq total) by mouth daily.   metFORMIN  1000 MG tablet Commonly known as: GLUCOPHAGE  Take 1 tablet (1,000 mg total) by mouth 2 (two) times daily.   nitroGLYCERIN  0.4 MG SL tablet Commonly known as: NITROSTAT  Place 0.4 mg under the tongue every 5 (five) minutes x 3 doses as needed for chest pain.    spironolactone  25 MG tablet Commonly known as: ALDACTONE  Take 0.5 tablets (12.5 mg total) by mouth daily.   tamsulosin  0.4 MG Caps capsule Commonly known as: FLOMAX  Take 1 capsule (0.4 mg total) by mouth 2 (two) times daily.   Trulicity  0.75 MG/0.5ML Soaj Generic drug: Dulaglutide  Inject 0.75 mg as directed once a week.         ROS:  A comprehensive review of systems was negative except for: Gastrointestinal: positive for right groin bulge/ pain  Blood pressure 135/87, pulse 82, temperature 97.6 F (36.4 C), temperature source Oral, resp. rate 18, height 5' 9 (1.753 m), weight 183 lb (83  kg), SpO2 97%.  Physical Exam GENERAL: Alert, cooperative, well developed, no acute distress HEENT: Normocephalic, normal oropharynx, moist mucous membranes CHEST: Clear to auscultation bilaterally, no wheezes, rhonchi, or crackles, lungs normal CARDIOVASCULAR: Normal heart rate and rhythmABDOMEN: Soft, non-tender, non-distended, without organomegaly, normal bowel sounds ABDOMINAL: Soft, nondistended nontender  GENITOURINARY: Right groin with laxity and reducible bulge EXTREMITIES: No cyanosis or edema NEUROLOGICAL: Cranial nerves grossly intact, moves all extremities without gross motor or sensory deficit  Results: Personally reviewed patient CT scan from 12/01/2023 and do not appreciate a hernia in this imaging on the right inguinal or left inguinal  There was some fat stranding in the area. He had a US  the day prior and they could not rule out an hernia.    Assessment & Plan Right inguinal hernia Chronic, symptomatic, likely fat-containing, reducible hernia. Deferred surgical intervention due to increased bleeding risk from dual antiplatelet therapy post-cardiac stenting and valve replacement. Surgery timing depends on cardiology input. Contacted cardiologist to determine safe duration for Plavix  therapy and earliest elective repair timing. At this time it sounds like it will be  September and I think his symptoms are minor, so this should be possible.  - Advised continuation of brace for symptom relief. - Recommended avoiding heavy lifting (>20-30 lbs). - Scheduled follow-up call in June to reassess. - Instructed to notify office if symptoms worsen or hernia enlarges. - Will verify with Dr. Debera exact timing for holding plavix  etc, will also need to hold the Farxiga  and Trulicity  preoperatively      All questions were answered to the satisfaction of the patient.   Manuelita JAYSON Pander 02/07/2024, 9:23 AM          [1]  Social History Tobacco Use   Smoking status: Former    Current packs/day: 0.00    Average packs/day: 0.8 packs/day for 30.0 years (22.5 ttl pk-yrs)    Types: Cigarettes    Start date: 01/25/1982    Quit date: 11/05/1988    Years since quitting: 35.2   Smokeless tobacco: Never  Vaping Use   Vaping status: Never Used  Substance Use Topics   Alcohol  use: Not Currently    Alcohol /week: 0.0 standard drinks of alcohol    Drug use: No

## 2024-02-09 ENCOUNTER — Ambulatory Visit
Admission: RE | Admit: 2024-02-09 | Discharge: 2024-02-09 | Disposition: A | Discharge status: Home / Self Care | Source: Ambulatory Visit | Attending: Internal Medicine | Admitting: Internal Medicine

## 2024-02-09 ENCOUNTER — Ambulatory Visit: Admitting: Physician Assistant

## 2024-02-09 VITALS — BP 122/72 | HR 83 | Ht 69.0 in | Wt 186.7 lb

## 2024-02-09 DIAGNOSIS — I25119 Atherosclerotic heart disease of native coronary artery with unspecified angina pectoris: Secondary | ICD-10-CM | POA: Insufficient documentation

## 2024-02-09 DIAGNOSIS — K869 Disease of pancreas, unspecified: Secondary | ICD-10-CM | POA: Diagnosis not present

## 2024-02-09 DIAGNOSIS — I350 Nonrheumatic aortic (valve) stenosis: Secondary | ICD-10-CM

## 2024-02-09 DIAGNOSIS — I5020 Unspecified systolic (congestive) heart failure: Secondary | ICD-10-CM

## 2024-02-09 DIAGNOSIS — K4090 Unilateral inguinal hernia, without obstruction or gangrene, not specified as recurrent: Secondary | ICD-10-CM

## 2024-02-09 DIAGNOSIS — Z952 Presence of prosthetic heart valve: Secondary | ICD-10-CM | POA: Diagnosis not present

## 2024-02-09 DIAGNOSIS — E785 Hyperlipidemia, unspecified: Secondary | ICD-10-CM | POA: Diagnosis not present

## 2024-02-09 DIAGNOSIS — Z950 Presence of cardiac pacemaker: Secondary | ICD-10-CM

## 2024-02-09 LAB — ECHOCARDIOGRAM COMPLETE
AV Mean grad: 7 mmHg
AV Peak grad: 12.9 mmHg
Ao pk vel: 1.79 m/s
Area-P 1/2: 3.31 cm2
S' Lateral: 4 cm

## 2024-02-09 NOTE — Progress Notes (Signed)
 " [facility]   MULTIDISCIPLINARY HEART VALVE CLINIC                                     Cardiology Office Note:    Date:  02/10/2024   ID:  Sean Villarreal, DOB 1940/12/20, MRN [id number]  PCP:  Vida Mardy DEL, PA-C  [facility] Cardiologist:  Jayson Sierras, MD  [facility] Structural heart: Ozell Fell, MD [facility] Electrophysiologist:  Danelle Birmingham, MD   Referring MD: Vida Mardy DEL, PA-C   1 month s/p TAVR  History of Present Illness:    Sean Villarreal is a 84 y.o. 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   He had CAD s/p DES to LAD and LCx in 04/2012, PTCA to ostial LCx in 03/2016 due to ISR, DES to ostial LCx in 09/2016 due to ISR, patent stents by repeat cath in 03/2018, ultimately underwent CABG in 12/2019 with LIMA to LAD and SVG to OM, NSTEMI in 09/2023 with DES to RCA. He was then admitted at the end of 10/2023 with recurrent NSTEMI without evidence of new occlusion on LHC but concerns for LFLG aortic stenosis. Echo 11/19/23 showed EF 50%, and LFLG AS with mean grad 18 mmHg, Vmax 2.74 m/s, AVA 0.86 cm2, DVI 0.27, SVI 26, and mild MR. S/p TAVR with a 26 mm Edwards Sapien 3 Ultra Resilia THV via the TF approach on 12/28/23. Post operative echo showed EF 60%, mod RVE, mod MAC with mild MR, normally functioning TAVR with a mean gradient of 7 mmHg and no PVL. 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 Birmingham.   Today the patient presents to clinic for follow up. Has had a big improvement since TAVR and pacemaker. He is mostly limited by his hernia. Wears a pressure garment which helps. No CP or SOB. No LE edema, orthopnea or PND. No dizziness or syncope. No blood in stool or urine. No  palpitations.    Past Medical History:  Diagnosis Date   Aortic stenosis    a. 03/2022 Echo: EF 60-65%, no rwma, mild LVH, nl RV fxn, mild-mod AS.   Arthritis    Basal cell carcinoma    Coronary artery disease    a. DES to LAD and LCx April 2014 b. PTCA ostial circumflex 03/2016 due to ISR c.  9/18 PCI/DESx2 osital Lcx (ISR), p/mLCx  d. s/p CABG on 01/24/2020 with LIMA-LAD and SVG-OM.   Essential hypertension    GERD (gastroesophageal reflux disease)    Hematuria    History of blood transfusion 09/2012   History of kidney stones    Hyperlipidemia LDL goal <70    NSTEMI (non-ST elevated myocardial infarction) (HCC) 08/2012   Post-op Afib (HCC)    a. 12/2019-01/2020 following CABG. Briefly on amio.   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 Fell and Dr. Daniel   Type II diabetes mellitus (HCC)      Current Medications: Active Medications[1]    ROS:   Please see the history of present illness.  All other systems reviewed and are negative.  EKGs       Risk Assessment/Calculations:           Physical Exam:    VS:  BP 122/72   Pulse 83   Ht 5' 9 (1.753 m)   Wt 186 lb 11.2 oz (84.7 kg)   SpO2 98%   BMI 27.57 kg/m     Wt Readings from Last 3 Encounters:  02/09/24 186 lb 11.2 oz (84.7 kg)  02/07/24 183 lb (83 kg)  01/11/24 183 lb (83 kg)     GEN: Well nourished, well developed in no acute distress NECK: No JVD CARDIAC: RRR, no murmurs, rubs, gallops RESPIRATORY:  Clear to auscultation without rales, wheezing or rhonchi  ABDOMEN: Soft, non-tender, non-distended EXTREMITIES:  No edema; No deformity.    ASSESSMENT:    1. S/P TAVR (transcatheter aortic valve replacement)   2. Pacemaker   3. Coronary artery disease involving native coronary artery of native heart with angina pectoris   4. Hyperlipidemia, unspecified hyperlipidemia type   5. Heart failure  with improved ejection fraction (HFimpEF) (HCC)   6. Pancreatic lesion   7. Inguinal hernia, right     PLAN:    In order of problems listed above:  Severe AS s/p TAVR:  -- Echo today shows EF 50%, mod MAC with mild MR, normally functioning TAVR with a mean gradient of 7 mm hg and no PVL.  -- NYHA class I symptoms with marked improvement since TAVR. -- Continue Asprin 81mg  daily and Plavix  75mg  daily given recent PCI in 09/2023. -- SBE prophylaxis discussed; the patient is edentulous and does not go to the dentist.  -- I will see back for 1 year office visit with echo.  S/p PPM: -- S/p successful implantation of Biotronik dual-chamber PPM on 12/30/2023. -- Follow up arranged with Dr Nancey in [place].    CAD: -- He is s/p DES to LAD and LCx in 04/2012, PTCA to ostial LCx in 03/2016 due to ISR, DES to ostial LCx in 09/2016 due to ISR, patent stents by repeat cath in 03/2018, ultimately underwent CABG in 12/2019 with LIMA to LAD and SVG to OM. Most recent event was an NSTEMI in 09/2023 with DES to RCA. Repeat cath in 10/2023 showed chronic occlusion of the proximal LAD but patent LIMA to LAD and chronic occlusion of the LCx with patent vein graft to the OM and RCA stent was patent.   -- Continue Asprin 81mg  daily and Plavix  75mg  daily given recent PCI in 09/2023- will need to continue for at least 1 year since intervention performed in the setting of NSTEMI. -- Continue Coreg  12.5mg  BID, Atorvastatin  80mg  daily and Zetia  10mg  daily.    HLD: -- LDL 46 in 10/2023. -- Continue Atorvastatin  80mg  daily and Zetia  10mg  daily.     HFimpEF: -- EF 50-55%. -- Appears euvolemic.  -- Continue home Lasix  40mg  daily/KCL 20 meq. -- Continue Spiro 12.5mg  daily, Coreg  12.5mg  BID and Farixga 10mg  daily.   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 -- Offered to send to GI but pt would prefer to discuss with PCP. A copy of the report was printed for the pt  Inguinal hernia: -- He is cleared  to proceed with this once he has completed 12 months of DAPT, after 09/2024.   Medication Adjustments/Labs and Tests Ordered: Current medicines are reviewed at length with the patient today.  Concerns regarding medicines are outlined above.  Orders Placed This Encounter  Procedures   ECHOCARDIOGRAM COMPLETE   No orders of the defined types were placed in this encounter.   Patient Instructions  Medication Instructions:  Your physician recommends that you continue on your current medications as directed. Please refer to the Current Medication list given to you today.  *If you need a refill on your cardiac medications before your next appointment, please call your pharmacy*  Lab Work: None needed If you have labs (blood work) drawn today and your tests are completely normal, you will receive your results only by: MyChart Message (if you have MyChart) OR A paper copy in the mail If you have any lab test that is abnormal or we need to change your treatment, we will call you to review the results.  Testing/Procedures: 12/20/2024 Your physician has requested that you have an echocardiogram. Echocardiography is a painless test that uses sound waves to create images of your heart. It provides your doctor with information about the size and shape of your heart and how well your hearts chambers and valves are working. This procedure takes approximately one hour. There are no restrictions for this procedure. Please do NOT wear cologne, perfume, aftershave, or lotions (deodorant is allowed). Please arrive 15 minutes prior to your appointment time.  Please note: We ask at that you not bring children with you during ultrasound (echo/ vascular) testing. Due to room size and safety  concerns, children are not allowed in the ultrasound rooms during exams. Our front office staff cannot provide observation of children in our lobby area while testing is being conducted. An adult accompanying a patient to their appointment will only be allowed in the ultrasound room at the discretion of the ultrasound technician under special circumstances. We apologize for any inconvenience.   Follow-Up: At [facility], you and your health needs are our priority.  As part of our continuing mission to provide you with exceptional heart care, our providers are all part of one team.  This team includes your primary Cardiologist (physician) and Advanced Practice Providers or APPs (Physician Assistants and Nurse Practitioners) who all work together to provide you with the care you need, when you need it.  Your next appointment:   As scheduled on 04/03/2024  Provider:   Jayson Sierras, MD    We recommend signing up for the patient portal called MyChart.  Sign up information is provided on this After Visit Summary.  MyChart is used to connect with patients for Virtual Visits (Telemedicine).  Patients are able to view lab/test results, encounter notes, upcoming appointments, etc.  Non-urgent messages can be sent to your provider as well.   To learn more about what you can do with MyChart, go to [email or web address].   Other Instructions We have printed a copy of your CT report and have highlighted a lesion on your pancrease that we recommend you speak with your primary doctor about.              Signed, Lamarr Hummer, PA-C  02/10/2024 9:15 AM    [facility]     [1]  Current Meds  Medication Sig   acetaminophen  (TYLENOL ) 500 MG tablet Take 1,000 mg by mouth every 6 (six) hours as needed for fever or  headache (pain).   aspirin  EC 81 MG tablet Take 81 mg by mouth daily.   atorvastatin  (LIPITOR ) 80 MG tablet Take 1 tablet (80 mg total) by mouth daily.    carvedilol  (COREG ) 12.5 MG tablet Take 1 tablet (12.5 mg total) by mouth 2 (two) times daily.   clopidogrel  (PLAVIX ) 75 MG tablet Take 1 tablet (75 mg total) by mouth daily.   dapagliflozin  propanediol (FARXIGA ) 10 MG TABS tablet Take 1 tablet (10 mg total) by mouth daily before breakfast.   Dulaglutide  (TRULICITY ) 0.75 MG/0.5ML SOAJ Inject 0.75 mg as directed once a week.   ezetimibe  (ZETIA ) 10 MG tablet Take 1 tablet (10 mg total) by mouth at bedtime.   fluticasone  (FLONASE ) 50 MCG/ACT nasal spray Place 1 spray into both nostrils daily as needed for allergies or rhinitis.   furosemide  (LASIX ) 40 MG tablet Take 1 tablet (40 mg total) by mouth daily.   glimepiride  (AMARYL ) 1 MG tablet Take 1 tablet (1 mg total) by mouth daily.   metFORMIN  (GLUCOPHAGE ) 1000 MG tablet Take 1 tablet (1,000 mg total) by mouth 2 (two) times daily.   nitroGLYCERIN  (NITROSTAT ) 0.4 MG SL tablet Place 0.4 mg under the tongue every 5 (five) minutes x 3 doses as needed for chest pain.   potassium chloride  SA (KLOR-CON  M) 20 MEQ tablet Take 1 tablet (20 mEq total) by mouth daily.   spironolactone  (ALDACTONE ) 25 MG tablet Take 0.5 tablets (12.5 mg total) by mouth daily.   tamsulosin  (FLOMAX ) 0.4 MG CAPS capsule Take 1 capsule (0.4 mg total) by mouth 2 (two) times daily.   "

## 2024-02-09 NOTE — Patient Instructions (Addendum)
 Medication Instructions:  Your physician recommends that you continue on your current medications as directed. Please refer to the Current Medication list given to you today.  *If you need a refill on your cardiac medications before your next appointment, please call your pharmacy*  Lab Work: None needed If you have labs (blood work) drawn today and your tests are completely normal, you will receive your results only by: MyChart Message (if you have MyChart) OR A paper copy in the mail If you have any lab test that is abnormal or we need to change your treatment, we will call you to review the results.  Testing/Procedures: 12/20/2024 Your physician has requested that you have an echocardiogram. Echocardiography is a painless test that uses sound waves to create images of your heart. It provides your doctor with information about the size and shape of your heart and how well your hearts chambers and valves are working. This procedure takes approximately one hour. There are no restrictions for this procedure. Please do NOT wear cologne, perfume, aftershave, or lotions (deodorant is allowed). Please arrive 15 minutes prior to your appointment time.  Please note: We ask at that you not bring children with you during ultrasound (echo/ vascular) testing. Due to room size and safety concerns, children are not allowed in the ultrasound rooms during exams. Our front office staff cannot provide observation of children in our lobby area while testing is being conducted. An adult accompanying a patient to their appointment will only be allowed in the ultrasound room at the discretion of the ultrasound technician under special circumstances. We apologize for any inconvenience.   Follow-Up: At [facility], you and your health needs are our priority.  As part of our continuing mission to provide you with exceptional heart care, our providers are all part of one team.  This team includes your primary  Cardiologist (physician) and Advanced Practice Providers or APPs (Physician Assistants and Nurse Practitioners) who all work together to provide you with the care you need, when you need it.  Your next appointment:   As scheduled on 04/03/2024  Provider:   Jayson Sierras, MD    We recommend signing up for the patient portal called MyChart.  Sign up information is provided on this After Visit Summary.  MyChart is used to connect with patients for Virtual Visits (Telemedicine).  Patients are able to view lab/test results, encounter notes, upcoming appointments, etc.  Non-urgent messages can be sent to your provider as well.   To learn more about what you can do with MyChart, go to [email or web address].   Other Instructions We have printed a copy of your CT report and have highlighted a lesion on your pancrease that we recommend you speak with your primary doctor about.

## 2024-02-10 ENCOUNTER — Ambulatory Visit: Payer: Self-pay | Admitting: Physician Assistant

## 2024-02-10 ENCOUNTER — Ambulatory Visit: Attending: Cardiovascular Disease

## 2024-02-10 DIAGNOSIS — I442 Atrioventricular block, complete: Secondary | ICD-10-CM | POA: Diagnosis not present

## 2024-02-13 LAB — CUP PACEART REMOTE DEVICE CHECK
Battery Voltage: 100
Date Time Interrogation Session: 20260116095524
Implantable Lead Connection Status: 753985
Implantable Lead Connection Status: 753985
Implantable Lead Implant Date: 20251205
Implantable Lead Implant Date: 20251205
Implantable Lead Location: 753859
Implantable Lead Location: 753860
Implantable Lead Model: 377
Implantable Lead Model: 377
Implantable Lead Serial Number: 8002181736
Implantable Lead Serial Number: 8002241551
Implantable Pulse Generator Implant Date: 20251205
Pulse Gen Serial Number: 1000661575

## 2024-02-15 ENCOUNTER — Other Ambulatory Visit (HOSPITAL_BASED_OUTPATIENT_CLINIC_OR_DEPARTMENT_OTHER): Payer: Self-pay

## 2024-02-15 MED ORDER — TAMSULOSIN HCL 0.4 MG PO CAPS
0.4000 mg | ORAL_CAPSULE | Freq: Two times a day (BID) | ORAL | 3 refills | Status: AC
  Filled 2024-02-15: qty 180, 90d supply, fill #0

## 2024-02-15 MED ORDER — GLIMEPIRIDE 1 MG PO TABS
1.0000 mg | ORAL_TABLET | Freq: Every day | ORAL | 3 refills | Status: AC
  Filled 2024-02-15: qty 90, 90d supply, fill #0

## 2024-02-16 NOTE — Progress Notes (Signed)
 Remote PPM Transmission

## 2024-02-18 ENCOUNTER — Ambulatory Visit: Payer: Self-pay | Admitting: Cardiovascular Disease

## 2024-02-21 ENCOUNTER — Other Ambulatory Visit (HOSPITAL_BASED_OUTPATIENT_CLINIC_OR_DEPARTMENT_OTHER): Payer: Self-pay

## 2024-02-28 ENCOUNTER — Other Ambulatory Visit (HOSPITAL_BASED_OUTPATIENT_CLINIC_OR_DEPARTMENT_OTHER): Payer: Self-pay

## 2024-02-28 MED ORDER — KETOCONAZOLE 2 % EX CREA
TOPICAL_CREAM | CUTANEOUS | 6 refills | Status: AC
  Filled 2024-02-28: qty 60, 30d supply, fill #0

## 2024-03-02 ENCOUNTER — Other Ambulatory Visit (HOSPITAL_BASED_OUTPATIENT_CLINIC_OR_DEPARTMENT_OTHER): Payer: Self-pay

## 2024-03-02 MED ORDER — CEPHALEXIN 500 MG PO CAPS
2000.0000 mg | ORAL_CAPSULE | Freq: Once | ORAL | 0 refills | Status: AC
  Filled 2024-03-02: qty 4, 1d supply, fill #0

## 2024-03-30 ENCOUNTER — Ambulatory Visit: Admitting: Cardiovascular Disease

## 2024-04-03 ENCOUNTER — Ambulatory Visit: Admitting: Cardiology

## 2024-04-10 ENCOUNTER — Ambulatory Visit: Admitting: Cardiology

## 2024-05-11 ENCOUNTER — Ambulatory Visit

## 2024-07-23 ENCOUNTER — Ambulatory Visit: Admitting: Urology

## 2024-08-10 ENCOUNTER — Ambulatory Visit

## 2024-12-17 ENCOUNTER — Ambulatory Visit (HOSPITAL_COMMUNITY)

## 2024-12-17 ENCOUNTER — Ambulatory Visit: Admitting: Physician Assistant
